# Patient Record
Sex: Female | Born: 1935
Health system: Southern US, Community
[De-identification: ages and names within clinical notes are randomized; demographics above are authoritative.]

## PROBLEM LIST (undated history)

## (undated) DIAGNOSIS — J309 Allergic rhinitis, unspecified: Secondary | ICD-10-CM

## (undated) DIAGNOSIS — R609 Edema, unspecified: Secondary | ICD-10-CM

## (undated) DIAGNOSIS — Z9981 Dependence on supplemental oxygen: Secondary | ICD-10-CM

## (undated) DIAGNOSIS — E559 Vitamin D deficiency, unspecified: Secondary | ICD-10-CM

## (undated) DIAGNOSIS — R0602 Shortness of breath: Secondary | ICD-10-CM

## (undated) DIAGNOSIS — Z973 Presence of spectacles and contact lenses: Secondary | ICD-10-CM

## (undated) DIAGNOSIS — I771 Stricture of artery: Secondary | ICD-10-CM

## (undated) DIAGNOSIS — N3000 Acute cystitis without hematuria: Secondary | ICD-10-CM

## (undated) DIAGNOSIS — M545 Low back pain, unspecified: Secondary | ICD-10-CM

## (undated) DIAGNOSIS — M1990 Unspecified osteoarthritis, unspecified site: Secondary | ICD-10-CM

## (undated) DIAGNOSIS — Z972 Presence of dental prosthetic device (complete) (partial): Secondary | ICD-10-CM

## (undated) DIAGNOSIS — R739 Hyperglycemia, unspecified: Secondary | ICD-10-CM

## (undated) DIAGNOSIS — R600 Localized edema: Secondary | ICD-10-CM

## (undated) DIAGNOSIS — M8580 Other specified disorders of bone density and structure, unspecified site: Secondary | ICD-10-CM

## (undated) DIAGNOSIS — K219 Gastro-esophageal reflux disease without esophagitis: Secondary | ICD-10-CM

## (undated) DIAGNOSIS — I10 Essential (primary) hypertension: Secondary | ICD-10-CM

## (undated) DIAGNOSIS — J449 Chronic obstructive pulmonary disease, unspecified: Secondary | ICD-10-CM

## (undated) DIAGNOSIS — R921 Mammographic calcification found on diagnostic imaging of breast: Secondary | ICD-10-CM

## (undated) DIAGNOSIS — E785 Hyperlipidemia, unspecified: Secondary | ICD-10-CM

## (undated) DIAGNOSIS — C50919 Malignant neoplasm of unspecified site of unspecified female breast: Secondary | ICD-10-CM

## (undated) HISTORY — DX: Edema, unspecified: R60.9

## (undated) HISTORY — DX: Hyperglycemia, unspecified: R73.9

## (undated) HISTORY — DX: Hyperlipidemia, unspecified: E78.5

## (undated) HISTORY — PX: BREAST LUMPECTOMY: SHX2

## (undated) HISTORY — PX: CATARACT EXTRACTION: SUR2

## (undated) HISTORY — DX: Vitamin D deficiency, unspecified: E55.9

## (undated) HISTORY — PX: BREAST BIOPSY: SHX20

## (undated) HISTORY — DX: Low back pain, unspecified: M54.50

## (undated) HISTORY — DX: Essential (primary) hypertension: I10

## (undated) HISTORY — DX: Stricture of artery: I77.1

## (undated) HISTORY — DX: Mammographic calcification found on diagnostic imaging of breast: R92.1

## (undated) HISTORY — DX: Low back pain: M54.5

## (undated) HISTORY — DX: Localized edema: R60.0

## (undated) HISTORY — DX: Chronic obstructive pulmonary disease, unspecified: J44.9

## (undated) HISTORY — DX: Other specified disorders of bone density and structure, unspecified site: M85.80

## (undated) HISTORY — DX: Gastro-esophageal reflux disease without esophagitis: K21.9

## (undated) HISTORY — DX: Allergic rhinitis, unspecified: J30.9

---

## 2000-01-04 ENCOUNTER — Ambulatory Visit (HOSPITAL_COMMUNITY): Admission: RE | Admit: 2000-01-04 | Discharge: 2000-01-04 | Payer: Self-pay | Admitting: Internal Medicine

## 2000-03-30 ENCOUNTER — Encounter: Payer: Self-pay | Admitting: Internal Medicine

## 2000-03-30 ENCOUNTER — Encounter: Admission: RE | Admit: 2000-03-30 | Discharge: 2000-03-30 | Payer: Self-pay | Admitting: Internal Medicine

## 2000-09-13 ENCOUNTER — Ambulatory Visit (HOSPITAL_COMMUNITY): Admission: RE | Admit: 2000-09-13 | Discharge: 2000-09-13 | Payer: Self-pay | Admitting: *Deleted

## 2001-04-05 ENCOUNTER — Other Ambulatory Visit: Admission: RE | Admit: 2001-04-05 | Discharge: 2001-04-05 | Payer: Self-pay | Admitting: Family Medicine

## 2001-09-10 ENCOUNTER — Encounter: Payer: Self-pay | Admitting: Internal Medicine

## 2001-09-10 ENCOUNTER — Encounter: Admission: RE | Admit: 2001-09-10 | Discharge: 2001-09-10 | Payer: Self-pay | Admitting: Internal Medicine

## 2001-09-12 ENCOUNTER — Encounter: Admission: RE | Admit: 2001-09-12 | Discharge: 2001-09-12 | Payer: Self-pay | Admitting: Internal Medicine

## 2001-09-12 ENCOUNTER — Encounter: Payer: Self-pay | Admitting: Internal Medicine

## 2002-06-18 ENCOUNTER — Ambulatory Visit (HOSPITAL_COMMUNITY): Admission: RE | Admit: 2002-06-18 | Discharge: 2002-06-18 | Payer: Self-pay | Admitting: Internal Medicine

## 2002-09-16 ENCOUNTER — Encounter: Payer: Self-pay | Admitting: Internal Medicine

## 2002-09-16 ENCOUNTER — Encounter: Admission: RE | Admit: 2002-09-16 | Discharge: 2002-09-16 | Payer: Self-pay | Admitting: Internal Medicine

## 2003-05-14 ENCOUNTER — Encounter: Admission: RE | Admit: 2003-05-14 | Discharge: 2003-05-14 | Payer: Self-pay | Admitting: Family Medicine

## 2003-10-30 ENCOUNTER — Encounter: Admission: RE | Admit: 2003-10-30 | Discharge: 2003-10-30 | Payer: Self-pay | Admitting: Internal Medicine

## 2004-03-09 ENCOUNTER — Other Ambulatory Visit: Admission: RE | Admit: 2004-03-09 | Discharge: 2004-03-09 | Payer: Self-pay | Admitting: Internal Medicine

## 2005-03-15 ENCOUNTER — Other Ambulatory Visit: Admission: RE | Admit: 2005-03-15 | Discharge: 2005-03-15 | Payer: Self-pay | Admitting: Internal Medicine

## 2005-03-24 ENCOUNTER — Encounter: Admission: RE | Admit: 2005-03-24 | Discharge: 2005-03-24 | Payer: Self-pay | Admitting: Internal Medicine

## 2006-04-26 ENCOUNTER — Encounter: Admission: RE | Admit: 2006-04-26 | Discharge: 2006-04-26 | Payer: Self-pay | Admitting: Family Medicine

## 2006-05-03 ENCOUNTER — Encounter: Admission: RE | Admit: 2006-05-03 | Discharge: 2006-05-03 | Payer: Self-pay | Admitting: Family Medicine

## 2006-11-03 ENCOUNTER — Encounter: Admission: RE | Admit: 2006-11-03 | Discharge: 2006-11-03 | Payer: Self-pay | Admitting: Family Medicine

## 2007-03-20 ENCOUNTER — Encounter: Payer: Self-pay | Admitting: Family Medicine

## 2007-03-20 ENCOUNTER — Ambulatory Visit (HOSPITAL_COMMUNITY): Admission: RE | Admit: 2007-03-20 | Discharge: 2007-03-20 | Payer: Self-pay | Admitting: Family Medicine

## 2007-03-20 ENCOUNTER — Other Ambulatory Visit: Admission: RE | Admit: 2007-03-20 | Discharge: 2007-03-20 | Payer: Self-pay | Admitting: Family Medicine

## 2007-05-22 ENCOUNTER — Encounter: Admission: RE | Admit: 2007-05-22 | Discharge: 2007-05-22 | Payer: Self-pay | Admitting: Family Medicine

## 2008-11-25 ENCOUNTER — Encounter: Admission: RE | Admit: 2008-11-25 | Discharge: 2008-11-25 | Payer: Self-pay | Admitting: Family Medicine

## 2009-12-30 ENCOUNTER — Encounter: Admission: RE | Admit: 2009-12-30 | Discharge: 2009-12-30 | Payer: Self-pay | Admitting: *Deleted

## 2010-03-08 ENCOUNTER — Encounter: Payer: Self-pay | Admitting: Family Medicine

## 2010-03-08 ENCOUNTER — Encounter: Payer: Self-pay | Admitting: Interventional Radiology

## 2011-01-20 ENCOUNTER — Other Ambulatory Visit: Payer: Self-pay | Admitting: Family Medicine

## 2011-01-20 DIAGNOSIS — Z1231 Encounter for screening mammogram for malignant neoplasm of breast: Secondary | ICD-10-CM

## 2011-02-18 ENCOUNTER — Ambulatory Visit: Payer: Self-pay

## 2011-04-01 ENCOUNTER — Other Ambulatory Visit: Payer: Self-pay | Admitting: Family Medicine

## 2011-04-01 DIAGNOSIS — Z1231 Encounter for screening mammogram for malignant neoplasm of breast: Secondary | ICD-10-CM

## 2011-04-25 ENCOUNTER — Ambulatory Visit
Admission: RE | Admit: 2011-04-25 | Discharge: 2011-04-25 | Disposition: A | Discharge status: Home / Self Care | Payer: Medicare Other | Source: Ambulatory Visit | Attending: Family Medicine | Admitting: Family Medicine

## 2011-04-25 DIAGNOSIS — Z1231 Encounter for screening mammogram for malignant neoplasm of breast: Secondary | ICD-10-CM

## 2012-02-15 HISTORY — PX: OTHER SURGICAL HISTORY: SHX169

## 2012-05-16 ENCOUNTER — Other Ambulatory Visit: Payer: Self-pay | Admitting: Family Medicine

## 2012-05-16 DIAGNOSIS — I10 Essential (primary) hypertension: Secondary | ICD-10-CM

## 2012-05-16 DIAGNOSIS — R0989 Other specified symptoms and signs involving the circulatory and respiratory systems: Secondary | ICD-10-CM

## 2012-05-16 DIAGNOSIS — E785 Hyperlipidemia, unspecified: Secondary | ICD-10-CM

## 2012-05-24 ENCOUNTER — Ambulatory Visit
Admission: RE | Admit: 2012-05-24 | Discharge: 2012-05-24 | Disposition: A | Discharge status: Home / Self Care | Payer: Medicare Other | Source: Ambulatory Visit | Attending: Family Medicine | Admitting: Family Medicine

## 2012-05-24 DIAGNOSIS — I10 Essential (primary) hypertension: Secondary | ICD-10-CM

## 2012-05-24 DIAGNOSIS — E785 Hyperlipidemia, unspecified: Secondary | ICD-10-CM

## 2012-05-24 DIAGNOSIS — R0989 Other specified symptoms and signs involving the circulatory and respiratory systems: Secondary | ICD-10-CM

## 2012-06-19 ENCOUNTER — Other Ambulatory Visit (HOSPITAL_COMMUNITY): Payer: Self-pay | Admitting: Cardiovascular Disease

## 2012-06-19 DIAGNOSIS — I739 Peripheral vascular disease, unspecified: Secondary | ICD-10-CM

## 2012-06-26 ENCOUNTER — Encounter (HOSPITAL_COMMUNITY): Payer: Medicare Other

## 2012-07-02 ENCOUNTER — Other Ambulatory Visit: Payer: Self-pay

## 2012-07-02 DIAGNOSIS — Z1231 Encounter for screening mammogram for malignant neoplasm of breast: Secondary | ICD-10-CM

## 2012-07-12 ENCOUNTER — Encounter (HOSPITAL_COMMUNITY): Payer: Medicare Other

## 2012-07-16 ENCOUNTER — Encounter (HOSPITAL_COMMUNITY): Payer: Medicare Other

## 2012-08-03 ENCOUNTER — Ambulatory Visit
Admission: RE | Admit: 2012-08-03 | Discharge: 2012-08-03 | Disposition: A | Discharge status: Home / Self Care | Payer: Medicare Other | Source: Ambulatory Visit

## 2012-08-03 DIAGNOSIS — Z1231 Encounter for screening mammogram for malignant neoplasm of breast: Secondary | ICD-10-CM

## 2012-08-24 ENCOUNTER — Telehealth: Payer: Self-pay | Admitting: Cardiovascular Disease

## 2012-08-30 ENCOUNTER — Encounter (HOSPITAL_COMMUNITY): Payer: Medicare Other

## 2013-09-11 ENCOUNTER — Other Ambulatory Visit: Payer: Self-pay | Admitting: Family Medicine

## 2013-09-11 DIAGNOSIS — M79606 Pain in leg, unspecified: Secondary | ICD-10-CM

## 2013-09-11 DIAGNOSIS — Z87891 Personal history of nicotine dependence: Secondary | ICD-10-CM

## 2013-09-12 ENCOUNTER — Other Ambulatory Visit: Payer: Self-pay | Admitting: Family Medicine

## 2013-09-12 ENCOUNTER — Ambulatory Visit
Admission: RE | Admit: 2013-09-12 | Discharge: 2013-09-12 | Disposition: A | Discharge status: Home / Self Care | Payer: Medicare Other | Source: Ambulatory Visit | Attending: Family Medicine | Admitting: Family Medicine

## 2013-09-12 DIAGNOSIS — R0602 Shortness of breath: Secondary | ICD-10-CM

## 2013-09-12 DIAGNOSIS — M79606 Pain in leg, unspecified: Secondary | ICD-10-CM

## 2013-09-12 DIAGNOSIS — J449 Chronic obstructive pulmonary disease, unspecified: Secondary | ICD-10-CM

## 2013-09-12 DIAGNOSIS — Z87891 Personal history of nicotine dependence: Secondary | ICD-10-CM

## 2013-09-17 ENCOUNTER — Other Ambulatory Visit: Payer: Self-pay

## 2013-09-17 DIAGNOSIS — Z1231 Encounter for screening mammogram for malignant neoplasm of breast: Secondary | ICD-10-CM

## 2013-09-25 ENCOUNTER — Ambulatory Visit: Payer: Medicare Other

## 2013-10-02 ENCOUNTER — Ambulatory Visit (INDEPENDENT_AMBULATORY_CARE_PROVIDER_SITE_OTHER): Payer: Medicare Other | Admitting: Pulmonary Disease

## 2013-10-02 ENCOUNTER — Encounter: Payer: Self-pay | Admitting: Pulmonary Disease

## 2013-10-02 VITALS — BP 142/60 | HR 103 | Ht 61.0 in | Wt 179.0 lb

## 2013-10-02 DIAGNOSIS — J438 Other emphysema: Secondary | ICD-10-CM

## 2013-10-02 DIAGNOSIS — R0902 Hypoxemia: Secondary | ICD-10-CM

## 2013-10-02 DIAGNOSIS — J9611 Chronic respiratory failure with hypoxia: Secondary | ICD-10-CM | POA: Insufficient documentation

## 2013-10-02 DIAGNOSIS — J9610 Chronic respiratory failure, unspecified whether with hypoxia or hypercapnia: Secondary | ICD-10-CM

## 2013-10-02 NOTE — Patient Instructions (Addendum)
Take spiriva 2 puffs daily no matter how you feel Take albuterol 2 puffs every four hours as needed for shortness of breath Use oxygen at 3L continuously We will arrange pulmonary rehab for you at [facility] We will see you back in 4-6 weeks or sooner if needed

## 2013-10-02 NOTE — Assessment & Plan Note (Signed)
COPD: GOLD GRADE D Combined recommendations from the [place], [employer]icians, Investment banker, corporate, [place] (Qaseem A et al, Ann Intern Med. 2011;155(3):179) recommends tobacco cessation, pulmonary rehab (for symptomatic patients with an FEV1 < 50% predicted), supplemental oxygen (for patients with SaO2 <88% or paO2 <55), and appropriate bronchodilator therapy.  In regards to long acting bronchodilators, they recommend monotherapy (FEV1 60-80% with symptoms weak evidence, FEV1 with symptoms <60% strong evidence), or combination therapy (FEV1 <60% with symptoms, strong recommendation, moderate evidence).  One should also provide patients with annual immunizations and consider therapy for prevention of COPD exacerbations (ie. roflumilast or azithromycin) when appopriate.  -O2 therapy: Recommended 2 L continuous  -Immunizations: Advised that she check with her PCP about pneumovax status -Tobacco use: Quit in 1990's -Exercise: Pulmonary rehab referral -Bronchodilator therapy: Start Spiriva -Exacerbation prevention: Spiriva

## 2013-10-02 NOTE — Progress Notes (Signed)
Subjective:    Patient ID: Courtney Cline, female    DOB: [birth year], 80 y.o.   MRN: [id number]  HPI  Courtney Cline previously saw an allergist for emphysema and asthma.  She is here to see me today because she has been having more trouble breathing.  She has been having more trouble in the last 6 weeks after a recent cold. This made her breathing worse and she needd to be seen by her PCP who treated her with prednisone twice.  She was also treated with an antibiotic iniitially as well, but she still was wheezing.  After the second round of prednisone she feels a little better, but not fully recovered.  This is the first time she has takend prednisone.  She used to get bronchitis in the past.   In the last few weeks she has been feeling chest tightness and dyspnea with exertion.  Just walking out to get the newspaper will make her dyspneic.  This seems to come and go. She is not coughing much, maybe a little more lately than normal.  She has noted more runny nose lately.  She thinks that she may have allergies.  She sneezes a lot.    She previously smoked 1 pack of cigarettes daily and quit in the 1990's after smoking for about 30 years.    As a child she had no respiratory problems.  Past Medical History  Diagnosis Date  . Hyperlipidemia   . Hypertension   . GERD (gastroesophageal reflux disease)   . COPD (chronic obstructive pulmonary disease)   . Osteopenia   . Breast calcification seen on mammogram     Right breast  . Hyperglycemia   . Peripheral edema   . Vitamin D deficiency   . Allergic rhinitis   . Low back pain      Family History  Problem Relation Age of Onset  . Emphysema Mother   . Emphysema Maternal Grandfather      History   Social History  . Marital Status: Married    Spouse Name: [name]    Number of Children: [name]  . Years of Education: [name]   Occupational History  . retired     Chartered certified accountant   Social History Main Topics  . Smoking status: Former Smoker  -- 1.00 packs/day for 40 years    Types: Cigarettes    Quit date: 02/14/1994  . Smokeless tobacco: Never Used  . Alcohol Use: No  . Drug Use: No  . Sexual Activity: Not on file   Other Topics Concern  . Not on file   Social History Narrative  . No narrative on file     Not on File   Outpatient Prescriptions Prior to Visit  Medication Sig Dispense Refill  . acetaminophen (TYLENOL) 325 MG tablet Take 650 mg by mouth every 6 (six) hours as needed.      Marland Kitchen albuterol (PROVENTIL HFA;VENTOLIN HFA) 108 (90 BASE) MCG/ACT inhaler Inhale 2 puffs into the lungs every 6 (six) hours as needed for wheezing or shortness of breath.      . Cyanocobalamin (VITAMIN B 12 PO) Take 1 tablet by mouth daily.      . hydrochlorothiazide (MICROZIDE) 12.5 MG capsule Take 12.5 mg by mouth daily.      Marland Kitchen lisinopril (PRINIVIL,ZESTRIL) 40 MG tablet Take 40 mg by mouth daily.      Marland Kitchen lovastatin (MEVACOR) 40 MG tablet Take 40 mg by mouth at bedtime.      Marland Kitchen  omeprazole (PRILOSEC) 20 MG capsule Take 20 mg by mouth daily.      . Fluticasone Propionate, Inhal, (FLOVENT DISKUS) 100 MCG/BLIST AEPB Inhale 1 puff into the lungs daily.      . Vitamin D, Ergocalciferol, (DRISDOL) 50000 UNITS CAPS capsule Take 50,000 Units by mouth every 7 (seven) days.       No facility-administered medications prior to visit.      Review of Systems  Constitutional: Negative for fever and unexpected weight change.  HENT: Positive for congestion, rhinorrhea and sinus pressure. Negative for dental problem, ear pain, nosebleeds, postnasal drip, sneezing, sore throat and trouble swallowing.   Eyes: Negative for redness and itching.  Respiratory: Positive for cough, chest tightness and shortness of breath. Negative for wheezing.   Cardiovascular: Positive for leg swelling. Negative for palpitations.  Gastrointestinal: Negative for nausea and vomiting.  Genitourinary: Negative for dysuria.  Musculoskeletal: Negative for joint swelling.  Skin:  Negative for rash.  Neurological: Negative for headaches.  Hematological: Does not bruise/bleed easily.  Psychiatric/Behavioral: Negative for dysphoric mood. The patient is not nervous/anxious.        Objective:   Physical Exam  Filed Vitals:   10/02/13 1437  BP: 142/60  Pulse: 103  Height: 5\' 1"  (1.549 m)  Weight: 179 lb (81.194 kg)  SpO2: 88%  RA  10/02/2013 > dropped to 85% on RA, improved to mid 90's on 3L [place]  Gen: well appearing, no acute distress HEENT: NCAT, PERRL, EOMi, OP clear, neck supple without masses PULM: diminished bilaterally, no wheezing CV: RRR, no mgr, no JVD AB: BS+, soft, nontender, no hsm Ext: chronic stasis changes, bluish discoloration, no edema, no clubbing Derm: see above, otherwise no rash or skin breakdown Neuro: A&Ox4, CN II-XII intact, strength 5/5 in all 4 extremities  08/2013 CXR > emphysema, prominent pulmonary vasculature, normal ? LAE      Assessment & Plan:   COPD (chronic obstructive pulmonary disease) COPD: GOLD GRADE D Combined recommendations from the [place], [employer]icians, Investment banker, corporate, [employer] Society (Qaseem A et al, Ann Intern Med. 2011;155(3):179) recommends tobacco cessation, pulmonary rehab (for symptomatic patients with an FEV1 < 50% predicted), supplemental oxygen (for patients with SaO2 <88% or paO2 <55), and appropriate bronchodilator therapy.  In regards to long acting bronchodilators, they recommend monotherapy (FEV1 60-80% with symptoms weak evidence, FEV1 with symptoms <60% strong evidence), or combination therapy (FEV1 <60% with symptoms, strong recommendation, moderate evidence).  One should also provide patients with annual immunizations and consider therapy for prevention of COPD exacerbations (ie. roflumilast or azithromycin) when appopriate.  -O2 therapy: Recommended 2 L continuous  -Immunizations: Advised that she check with her PCP about  pneumovax status -Tobacco use: Quit in 1990's -Exercise: Pulmonary rehab referral -Bronchodilator therapy: Start Spiriva -Exacerbation prevention: Spiriva   Chronic hypoxemic respiratory failure Today on ambulatory oximetry she started at 88% at rest, then dropped below 88% on RA, improved to mid 90's on 2L [place]  Plan: -2L O2 continuously    Updated Medication List Outpatient Encounter Prescriptions as of 10/02/2013  Medication Sig  . acetaminophen (TYLENOL) 325 MG tablet Take 650 mg by mouth every 6 (six) hours as needed.  Marland Kitchen albuterol (PROVENTIL HFA;VENTOLIN HFA) 108 (90 BASE) MCG/ACT inhaler Inhale 2 puffs into the lungs every 6 (six) hours as needed for wheezing or shortness of breath.  . Cholecalciferol (VITAMIN D) 2000 UNITS CAPS Take 1 capsule by mouth daily.  . Cyanocobalamin (VITAMIN B 12  PO) Take 1 tablet by mouth daily.  . hydrochlorothiazide (MICROZIDE) 12.5 MG capsule Take 12.5 mg by mouth daily.  Marland Kitchen lisinopril (PRINIVIL,ZESTRIL) 40 MG tablet Take 40 mg by mouth daily.  Marland Kitchen lovastatin (MEVACOR) 40 MG tablet Take 40 mg by mouth at bedtime.  Marland Kitchen omeprazole (PRILOSEC) 20 MG capsule Take 20 mg by mouth daily.  . [DISCONTINUED] Fluticasone Propionate, Inhal, (FLOVENT DISKUS) 100 MCG/BLIST AEPB Inhale 1 puff into the lungs daily.  . [DISCONTINUED] Vitamin D, Ergocalciferol, (DRISDOL) 50000 UNITS CAPS capsule Take 50,000 Units by mouth every 7 (seven) days.

## 2013-10-02 NOTE — Assessment & Plan Note (Addendum)
Today on ambulatory oximetry she started at 88% at rest, then dropped below 88% on RA, improved to mid 90's on 2L [place]  Plan: -2L O2 continuously

## 2013-10-11 ENCOUNTER — Ambulatory Visit
Admission: RE | Admit: 2013-10-11 | Discharge: 2013-10-11 | Disposition: A | Discharge status: Home / Self Care | Payer: Medicare Other | Source: Ambulatory Visit

## 2013-10-11 ENCOUNTER — Telehealth (HOSPITAL_COMMUNITY): Payer: Self-pay

## 2013-10-11 DIAGNOSIS — Z1231 Encounter for screening mammogram for malignant neoplasm of breast: Secondary | ICD-10-CM

## 2013-10-14 ENCOUNTER — Other Ambulatory Visit: Payer: Self-pay | Admitting: Family Medicine

## 2013-10-14 ENCOUNTER — Telehealth (HOSPITAL_COMMUNITY): Payer: Self-pay

## 2013-10-14 DIAGNOSIS — R928 Other abnormal and inconclusive findings on diagnostic imaging of breast: Secondary | ICD-10-CM

## 2013-10-22 ENCOUNTER — Telehealth: Payer: Self-pay | Admitting: Pulmonary Disease

## 2013-10-22 MED ORDER — TIOTROPIUM BROMIDE MONOHYDRATE 2.5 MCG/ACT IN AERS: 2.0000 | INHALATION_SPRAY | Freq: Every day | RESPIRATORY_TRACT | Status: DC

## 2013-10-22 NOTE — Telephone Encounter (Signed)
atc fast busy signal x 4 wcb

## 2013-10-22 NOTE — Telephone Encounter (Signed)
Called spoke with pt. She needs her spiriva resp called in. She c/o nasal congestion. I advised pt she can take OTC decongestant and get saline nasal spray. She will try this. Nothing further needed

## 2013-10-23 ENCOUNTER — Ambulatory Visit
Admission: RE | Admit: 2013-10-23 | Discharge: 2013-10-23 | Disposition: A | Discharge status: Home / Self Care | Payer: Medicare Other | Source: Ambulatory Visit | Attending: Family Medicine | Admitting: Family Medicine

## 2013-10-23 ENCOUNTER — Other Ambulatory Visit: Payer: Self-pay | Admitting: Family Medicine

## 2013-10-23 DIAGNOSIS — R928 Other abnormal and inconclusive findings on diagnostic imaging of breast: Secondary | ICD-10-CM

## 2013-10-23 DIAGNOSIS — N6310 Unspecified lump in the right breast, unspecified quadrant: Secondary | ICD-10-CM

## 2013-10-24 ENCOUNTER — Other Ambulatory Visit: Payer: Self-pay | Admitting: Family Medicine

## 2013-10-24 DIAGNOSIS — C50911 Malignant neoplasm of unspecified site of right female breast: Secondary | ICD-10-CM

## 2013-10-29 ENCOUNTER — Other Ambulatory Visit: Payer: Medicare Other

## 2013-11-05 ENCOUNTER — Telehealth: Payer: Self-pay | Admitting: *Deleted

## 2013-11-05 NOTE — Telephone Encounter (Signed)
Received referral from [place].  Called and confirmed 11/18/13 high risk appt w/ pt.  Emailed Engineer, civil (consulting) at [employer] to make her aware.  Mailed welcoming letter, calendar and intake form to pt.  Took paperwork to HIM to create chart.

## 2013-11-11 ENCOUNTER — Encounter: Payer: Self-pay | Admitting: Pulmonary Disease

## 2013-11-11 ENCOUNTER — Ambulatory Visit (INDEPENDENT_AMBULATORY_CARE_PROVIDER_SITE_OTHER): Payer: Medicare Other | Admitting: Pulmonary Disease

## 2013-11-11 VITALS — BP 144/70 | HR 90 | Ht 61.0 in | Wt 182.1 lb

## 2013-11-11 DIAGNOSIS — Z23 Encounter for immunization: Secondary | ICD-10-CM

## 2013-11-11 DIAGNOSIS — R0902 Hypoxemia: Secondary | ICD-10-CM

## 2013-11-11 DIAGNOSIS — J438 Other emphysema: Secondary | ICD-10-CM

## 2013-11-11 DIAGNOSIS — J9611 Chronic respiratory failure with hypoxia: Secondary | ICD-10-CM

## 2013-11-11 DIAGNOSIS — J9610 Chronic respiratory failure, unspecified whether with hypoxia or hypercapnia: Secondary | ICD-10-CM

## 2013-11-11 NOTE — Progress Notes (Signed)
   Subjective:    Patient ID: Courtney Cline, female    DOB: 1933-09-13, 80 y.o.   MRN: [id number]  Synopsis: GOLD Grade D COPD 2015 FEV1 33% pred, on 2L O2 continuously  HPI  11/11/2013 ROV > [name] has been doing [place]. She has been feeling OK.  She would like a smaller or lighter oxygen tank.  However she has been found to have breast cancer.  She has seen a Psychologist, sport and exercise and the plan is to treat with medical management at this point.  Her breathing has been OK.  She has been coughing some but it is better.  She continues to take the Spiriva daly but she can't feel a difference with it.  She has yet to get a Flu shot this year.  She has yet to see an oncologist.    Past Medical History  Diagnosis Date  . Hyperlipidemia   . Hypertension   . GERD (gastroesophageal reflux disease)   . COPD (chronic obstructive pulmonary disease)   . Osteopenia   . Breast calcification seen on mammogram     Right breast  . Hyperglycemia   . Peripheral edema   . Vitamin D deficiency   . Allergic rhinitis   . Low back pain      Review of Systems     Objective:   Physical Exam Filed Vitals:   11/11/13 1047  BP: 144/70  Pulse: 90  Height: 5\' 1"  (1.549 m)  Weight: 182 lb 1.6 oz (82.6 kg)  SpO2: 95%   2L O2  Gen:, no acute distress HEENT: NCAT, EOMi, OP clear, PULM: Minimal air movement, no wheezing CV: RRR, no mgr, no JVD AB: BS+, soft, nontender, no hsm Ext: warm, trace edema, no clubbing, no cyanosis Derm: no rash or skin breakdown Neuro: A&Ox4, MAEW        Assessment & Plan:   COPD (chronic obstructive pulmonary disease) This has been a stable interval for [name].  Plan: -continue Spiriva daily -flu shot UTD -I asked her to discuss whether or not she has had the Prevnar vaccine with her PCP -f/u 70months  Chronic hypoxemic respiratory failure Continue 2 L on exertion and qHS    Updated Medication List Outpatient Encounter Prescriptions as of 11/11/2013    Medication Sig  . acetaminophen (TYLENOL) 325 MG tablet Take 650 mg by mouth every 6 (six) hours as needed.  Marland Kitchen albuterol (PROVENTIL HFA;VENTOLIN HFA) 108 (90 BASE) MCG/ACT inhaler Inhale 2 puffs into the lungs every 6 (six) hours as needed for wheezing or shortness of breath.  . Cholecalciferol (VITAMIN D) 2000 UNITS CAPS Take 1 capsule by mouth daily.  . Cyanocobalamin (VITAMIN B 12 PO) Take 1 tablet by mouth daily.  . hydrochlorothiazide (MICROZIDE) 12.5 MG capsule Take 12.5 mg by mouth daily.  Marland Kitchen lisinopril (PRINIVIL,ZESTRIL) 40 MG tablet Take 40 mg by mouth daily.  Marland Kitchen lovastatin (MEVACOR) 40 MG tablet Take 40 mg by mouth at bedtime.  Marland Kitchen omeprazole (PRILOSEC) 20 MG capsule Take 20 mg by mouth daily.  . Tiotropium Bromide Monohydrate (SPIRIVA RESPIMAT) 2.5 MCG/ACT AERS Inhale 2 puffs into the lungs daily.

## 2013-11-11 NOTE — Assessment & Plan Note (Signed)
This has been a stable interval for [name].  Plan: -continue Spiriva daily -flu shot UTD -I asked her to discuss whether or not she has had the Prevnar vaccine with her PCP -f/u 29months

## 2013-11-11 NOTE — Patient Instructions (Signed)
Keep taking the Spiriva every day We will prescribe a portable oxygen concentrator for you  Ask your primary care physician about having the Prevnar vaccine  We will see you back in 6 months or sooner if needed

## 2013-11-11 NOTE — Assessment & Plan Note (Signed)
Continue 2 L on exertion and qHS

## 2013-11-12 ENCOUNTER — Telehealth: Payer: Self-pay | Admitting: Hematology

## 2013-11-12 NOTE — Telephone Encounter (Signed)
C/D 11/12/13 for appt. 11/18/13

## 2013-11-18 ENCOUNTER — Ambulatory Visit (HOSPITAL_BASED_OUTPATIENT_CLINIC_OR_DEPARTMENT_OTHER): Payer: Medicare Other

## 2013-11-18 ENCOUNTER — Ambulatory Visit (HOSPITAL_BASED_OUTPATIENT_CLINIC_OR_DEPARTMENT_OTHER): Payer: Medicare Other | Admitting: Hematology

## 2013-11-18 ENCOUNTER — Encounter: Payer: Self-pay | Admitting: Hematology

## 2013-11-18 VITALS — BP 150/49 | HR 103 | Temp 98.0°F | Resp 19 | Ht 61.0 in | Wt 182.5 lb

## 2013-11-18 DIAGNOSIS — Z79811 Long term (current) use of aromatase inhibitors: Secondary | ICD-10-CM

## 2013-11-18 DIAGNOSIS — C50211 Malignant neoplasm of upper-inner quadrant of right female breast: Secondary | ICD-10-CM

## 2013-11-18 DIAGNOSIS — J449 Chronic obstructive pulmonary disease, unspecified: Secondary | ICD-10-CM

## 2013-11-18 MED ORDER — ANASTROZOLE 1 MG PO TABS: 1.0000 mg | ORAL_TABLET | Freq: Every day | ORAL | Status: DC

## 2013-11-18 NOTE — Progress Notes (Signed)
Checked in new patient with no financial issues prior to seeing the dr. She has appt card and has not been out of the country,

## 2013-11-19 DIAGNOSIS — C50211 Malignant neoplasm of upper-inner quadrant of right female breast: Secondary | ICD-10-CM | POA: Insufficient documentation

## 2013-11-21 ENCOUNTER — Telehealth: Payer: Self-pay | Admitting: Hematology

## 2013-11-21 NOTE — Telephone Encounter (Signed)
s/w pt re appt for 11/12

## 2013-11-24 NOTE — Progress Notes (Signed)
[facility] ONCOLOGY CONSULT NOTE Date of Visit: 11/18/2013   Patient Care Team: Antony Blackbird, MD as PCP - General (Family Medicine) Simonne Maffucci - Pulmonary Erroll Luna - Surgery  CHIEF COMPLAINTS/PURPOSE OF CONSULTATION:   Left Breast cancer  HISTORY OF PRESENTING ILLNESS:   Courtney Cline 80 y.o. female from [place] referred here for recent diagnosis of left breast cancer.  I tabulated her oncologic history below.  SUMMARY OF ONCOLOGIC HISTORY:   Breast cancer of upper-inner quadrant of right female breast   12/06/2012 Imaging Bone Density performed at [facility] physicians T score Lumbar spine -0.1 (-0.7 in 2012), Right neck femur -1.4 (-1.2 before), Left neck femur -1.4 (-1.5 before) and Interpretation is OSTEOPENIA by WHO criteria.    10/11/2013 Mammogram Abnormal Screening mammogram showing Right Breast mass.   10/23/2013 Breast US Diagnostic mammogram and US showed an irregular hypoechoic mass at 9'0 clock position right breast 8 cm from nipple. [place] measurement 1.2x0.7x1.1 cm, no axillary adenopathy.   10/23/2013 Initial Biopsy US guided biopsy  done with clip placement.   10/23/2013 Pathology Results Invasive ductal carcinoma (IDC), DCIS, invasive cancer grade 2, ER 100%+, PR 100%+, Ki-67% 7%, HER-2/NEU by CISH No amplification, ratio of her2:cep17 1.00, average her2 copy number per cell 1.95 (HER 2 Negative tumor). Molecular Classification LUMINAL A.    Clinical Stage T1C,N0,MX  (Stage 1-A)   11/01/2013 Surgery Initial surgical evaluation by Dr Marcello Moores Cornett: "Not good surgical candidate because of pulmonary status". Recommended anti-estrogen therapy.   11/18/2013 -  Neo-Adjuvant Anti-estrogen oral therapy Patient started on Arimidex today after initial evaluation (Dr Lona Kettle). Also discussed with Dr Lake Bells and Dr Brantley Stage to reconsider surgery after optimization of her pulmonary status, perhaps with Local anesthesia.   I spoke today with Dr Brantley Stage and her surgeon  and also Dr Jana Hakim. Ideally her chances of cure are maximum if we can resect the tumor (wide excision, lumpectomy, mastectomy) but because of her COPD and oxygen dependence, Dr Brantley Stage wanted to seek alternative options. Dr Jana Hakim said that and I discussed with Dr Brantley Stage if we can consider a surgery under local anesthesia the lumpectomy, not a SLN biopsy. I did start patient on Arimidex which can potentially control the cancer but does not give a curative potential to patient. Dr Lake Bells said that he would be willing to see patient to further optimize her pulmonary status but categorize her as a "Moderate" risk for anesthesia complication because of the nature of surgery. She smoked for 40+ years and quit smoking 21 years ago. Her FEV1 was 480 cc as per DR McQuaid.   In terms of breast cancer risk profile:  She menarched at early age of 22 and went to menopause at age 25.  She had 2 pregnancy, her first child was born at age 63.  She did not  received birth control pills.   She was never exposed to fertility medications or hormone replacement therapy.  She has no family history of Breast/GYN/GI cancer. Husband had Kidney cancer.she never had colonoscopy.  MEDICAL HISTORY:  Past Medical History  Diagnosis Date  . Hyperlipidemia   . Hypertension   . GERD (gastroesophageal reflux disease)   . COPD (chronic obstructive pulmonary disease)   . Osteopenia   . Breast calcification seen on mammogram     Right breast  . Hyperglycemia   . Peripheral edema   . Vitamin D deficiency   . Allergic rhinitis   . Low back pain     SURGICAL  HISTORY: No past surgical history on file.  SOCIAL HISTORY: History   Social History  . Marital Status: Married    Spouse Name: [name]    Number of Children: [name]  . Years of Education: [name]   Occupational History  . retired     Chartered certified accountant   Social History Main Topics  . Smoking status: Former Smoker -- 1.00 packs/day for 40 years    Types: Cigarettes     Quit date: 02/14/1994  . Smokeless tobacco: Never Used  . Alcohol Use: No  . Drug Use: No  . Sexual Activity: Not on file   Other Topics Concern  . Not on file   Social History Narrative  . No narrative on file    FAMILY HISTORY: Family History  Problem Relation Age of Onset  . Emphysema Mother   . Emphysema Maternal Grandfather     ALLERGIES:  has No Known Allergies.  MEDICATIONS:  Current Outpatient Prescriptions  Medication Sig Dispense Refill  . acetaminophen (TYLENOL) 325 MG tablet Take 650 mg by mouth every 6 (six) hours as needed.      Marland Kitchen albuterol (PROVENTIL HFA;VENTOLIN HFA) 108 (90 BASE) MCG/ACT inhaler Inhale 2 puffs into the lungs every 6 (six) hours as needed for wheezing or shortness of breath.      . Cholecalciferol (VITAMIN D) 2000 UNITS CAPS Take 1 capsule by mouth daily.      . Cyanocobalamin (VITAMIN B 12 PO) Take 1 tablet by mouth daily.      . hydrochlorothiazide (MICROZIDE) 12.5 MG capsule Take 12.5 mg by mouth daily.      Marland Kitchen lisinopril (PRINIVIL,ZESTRIL) 40 MG tablet Take 40 mg by mouth daily.      Marland Kitchen lovastatin (MEVACOR) 40 MG tablet Take 40 mg by mouth at bedtime.      Marland Kitchen omeprazole (PRILOSEC) 20 MG capsule Take 20 mg by mouth daily.      . Tiotropium Bromide Monohydrate (SPIRIVA RESPIMAT) 2.5 MCG/ACT AERS Inhale 2 puffs into the lungs daily.  4 g  4  . anastrozole (ARIMIDEX) 1 MG tablet Take 1 tablet (1 mg total) by mouth daily.  90 tablet  3   No current facility-administered medications for this visit.    Review of Systems  Constitutional: Negative for fever, chills, weight loss, malaise/fatigue and diaphoresis.  HENT: Negative for congestion, ear discharge, ear pain, hearing loss, nosebleeds, sore throat and tinnitus.   Eyes: Negative for blurred vision, double vision, photophobia, pain, discharge and redness.  Respiratory: Positive for shortness of breath. Negative for cough, hemoptysis, sputum production, wheezing and stridor.   Cardiovascular:  Negative for chest pain, palpitations, orthopnea, claudication, leg swelling and PND.  Gastrointestinal: Negative for heartburn, nausea, vomiting, abdominal pain, diarrhea, constipation, blood in stool and melena.  Genitourinary: Negative for dysuria, urgency, frequency, hematuria and flank pain.  Musculoskeletal: Negative for back pain, falls, joint pain, myalgias and neck pain.  Skin: Negative for itching and rash.  Neurological: Negative for dizziness, tingling, tremors, sensory change, speech change, focal weakness, seizures, loss of consciousness, weakness and headaches.  Endo/Heme/Allergies: Negative for environmental allergies and polydipsia. Does not bruise/bleed easily.  Psychiatric/Behavioral: Negative for depression, suicidal ideas, memory loss and substance abuse. The patient is not nervous/anxious and does not have insomnia.      PHYSICAL EXAMINATION: ECOG PERFORMANCE STATUS: 1  Filed Vitals:   11/18/13 1408  BP: 150/49  Pulse: 103  Temp: 98 F (36.7 C)  Resp: 19   Filed Weights   11/18/13  1408  Weight: 182 lb 8 oz (82.781 kg)    Physical Exam  Constitutional: She is oriented to person, place, and time. She appears well-developed and well-nourished.  HENT:  Head: Normocephalic and atraumatic.  Eyes: Conjunctivae and EOM are normal. Pupils are equal, round, and reactive to light.  Neck: Normal range of motion. Neck supple. No JVD present. No thyromegaly present.  Cardiovascular: Normal rate, regular rhythm, normal heart sounds and intact distal pulses.  Exam reveals no gallop and no friction rub.   No murmur heard. Pulmonary/Chest: Effort normal and breath sounds normal. No respiratory distress. She has no wheezes. She has no rales. She exhibits no tenderness. Right breast exhibits mass. Right breast exhibits no inverted nipple, no nipple discharge, no skin change and no tenderness. Left breast exhibits no inverted nipple, no mass, no nipple discharge, no skin change  and no tenderness.  Abdominal: Soft. She exhibits no distension and no mass. There is no tenderness. No hernia.  Musculoskeletal: Normal range of motion. She exhibits no edema and no tenderness.  Lymphadenopathy:    She has no cervical adenopathy.  Neurological: She is alert and oriented to person, place, and time. She has normal reflexes.  Skin: Skin is warm and dry. No rash noted. No erythema. No pallor.  Psychiatric: She has a normal mood and affect. Her behavior is normal. Judgment and thought content normal.    LABORATORY DATA:  I have reviewed the data as listed  RADIOGRAPHIC STUDIES: I have personally reviewed the radiological images as listed and agreed with the findings in the report. Mammogram and [place].  ASSESSMENT/PLAN:   84. 80 years old female with a Right Breast cancer. Clinical stage stage 1 (T1C,N0,M0). The tumor is Invasive ductal cancer with DCIS, Grade 2, ER+, PR+, HER2 negative, LUMINAL A molecular type.  2. Patient with Moderate Risk of anesthesia Complications as she has COPD and on home oxygen.  3. I discussed the case with the surgeon, her pulmonologist and Dr Jana Hakim. We can consider the surgery if her pulmonary status can be further optimized and if it can be done under local anesthesia.  4. In the mean time I have placed her on Arimidex so as to start the endocrine therapy and prevent any further metastasis. If surgery not done we can consider another [place] or MRI breast in 3 months to see response to neoadjuvant therapy.  5. I will see her again in 1 month. Her prognosis is good but if she can tolerate a surgery or wide excision, it will give her the best chance for longevity and cure.    All questions were answered. The patient knows to call the clinic with any problems, questions or concerns. I spent 30 minutes counseling the patient face to face. The total time spent in the appointment was 1 hour.     Bernadene Bell, MD Medical Hematologist/Oncologist [place] Pager: [phone] Office No: [phone]

## 2013-11-25 ENCOUNTER — Encounter: Payer: Self-pay | Admitting: Pulmonary Disease

## 2013-12-06 ENCOUNTER — Ambulatory Visit (INDEPENDENT_AMBULATORY_CARE_PROVIDER_SITE_OTHER): Payer: Self-pay | Admitting: Surgery

## 2013-12-06 DIAGNOSIS — C50911 Malignant neoplasm of unspecified site of right female breast: Secondary | ICD-10-CM

## 2013-12-06 NOTE — H&P (Signed)
Courtney Cline 12/06/2013 12:05 PM Location: [facility] Patient #: [id number] DOB: [birth year] Married / Language: English / Race: White Female History of Present Illness Courtney Cline Fus A. [name] MD; 12/06/2013 12:33 PM) Patient words: Eval for lumpectomy on Rt breast   CLINICAL DATA: Possible mass right breast identified on recent screening mammogram. EXAM: DIGITAL DIAGNOSTIC RIGHT MAMMOGRAM ULTRASOUND RIGHT BREAST COMPARISON: 10/11/2013 ACR Breast Density Category b: There are scattered areas of fibroglandular density. FINDINGS: Additional views performed today confirm an irregular mass with spiculated margins in the posterior third of the outer right breast. On physical exam, I do not palpate a discrete mass in the outer right breast. Ultrasound is performed, showing an irregular hypoechoic mass at 9 o'clock position right breast 8 cm from the nipple. This measures 1.2 x 0.7 x 1.1 cm and contains some internal vascular flow. Ultrasound of the right axilla shows no evidence of lymphadenopathy. IMPRESSION: 1.2 cm suspicious mass at 9 o'clock position right breast. RECOMMENDATION: Ultrasound-guided core needle biopsy of the right breast mass is recommended. The patient is scheduled to have the biopsy performed later today. I have discussed the findings and recommendations with the patient. Results were also provided in writing at the conclusion of the visit. If applicable, a reminder letter will be sent to the patient regarding the next appointment. BI-RADS CATEGORY 5: Highly suggestive of malignancy. Electronically Signed By: Courtney Cline M.D. On: 10/23/2013 12:48.  The patient is a 80 year old female who presents with breast cancer. The patient is being seen for a consultation for Stage I invasive ductal carcinoma of the right breast. Tumor markers include estrogen receptor positive, progesterone receptor positive and HER-2/neu negative. No associated conditions  are noted. The patient was referred by a specialty consultant. Initial presentation was for an abnormal mammogram. Current diagnosis was determined by mammography, breast ultrasound and core needle biopsy. This problem has not been previously evaluated. This problem has not been previously treated.    PT SEEN BY ONCOLOGY AND PULMONOLOGIST AND FELT TO BE SUITABLE FOR LUMPECTOMY. ONCOLOGY FELT THAT THIS WOULD BE BEST OPTION FOR LOCAL CONTROL.  The patient is a 80 year old female   Allergies Courtney Church, LPN; 04/54/0981 12:06 PM) No Known Drug Allergies 11/01/2013  Medication History Courtney Church, LPN; 19/14/7829 12:09 PM) Albuterol Sulfate (108 (90 Base)MCG/ACT Aero Pow Br Act, Inhalation) Active. Vitamin D (2000UNIT Capsule, Oral) Active. Vitamin B12 TR ( Tablet ER, Oral) Active. Microzide (12.5MG  Capsule, Oral) Active. Lisinopril (40MG  Tablet, Oral) Active. Mevacor (40MG  Tablet, Oral) Active. PriLOSEC OTC (20MG  Tablet DR, Oral) Active. Tiotropium Bromide Monohydrate (2.5MCG/ACT Aerosol Soln, Inhalation) Active. Arimidex (1MG  Tablet, Oral) Active. Amoxicillin ER (775MG  Tablet ER 24HR, Oral) Active.    Vitals Courtney Church LPN; 56/21/3086 12:10 PM) 12/06/2013 12:09 PM Weight: 183.25 lb Height: 61.5in Body Surface Area: 1.9 m Body Mass Index: 34.06 kg/m Temp.: 97.69F(Tympanic)  Pulse: 66 (Regular)  Resp.: 24 (Wheezing)  BP: 140/60 (Sitting, Left Arm, Standard)     Physical Exam (Courtney Cline A. Courtney Hargrove MD; 12/06/2013 12:34 PM)  General Mental Status-Alert. General Appearance-Consistent with stated age. Hydration-Well hydrated. Voice-Normal.  Head and Neck Head-normocephalic, atraumatic with no lesions or palpable masses.  Eye Eyeball - Bilateral-Extraocular movements intact. Sclera/Conjunctiva - Bilateral-No scleral icterus.  Chest and Lung Exam Note: ON HOME O2 NOT sob bs CLEAR   Breast Note: NOT  REPEATED   Cardiovascular Cardiovascular examination reveals -on palpation PMI is normal in location and amplitude, no palpable S3 or S4. Normal cardiac borders., normal heart  sounds, regular rate and rhythm with no murmurs, carotid auscultation reveals no bruits and normal pedal pulses bilaterally.  Musculoskeletal Normal Exam - Left-Upper Extremity Strength Normal and Lower Extremity Strength Normal. Normal Exam - Right-Upper Extremity Strength Normal, Lower Extremity Weakness.    Assessment & Plan (Courtney Cline A. Courtney Codd MD; 12/06/2013 12:30 PM)  BREAST CANCER, RIGHT (174.9  C50.911) Impression: SEEN BY ONCOLOGY AND PULMONARY. FELT TO BE STABLE ENOUGH FOR LUMPECTOMY. WILL NEED REGIONAL BLOCK AND LOCAL. TRY TO DO UNDER MAC. STILL HIGH RISK BUT OK WITH THAT. Risk of lumpectomy include bleeding, infection, seroma, more surgery, use of seed/wire, wound care, cosmetic deformity and the need for other treatments, death , blood clots, death. Pt agrees to proceed.  Current Plans Pt Education - CSS Breast Biopsy Instructions (FLB): discussed with patient and provided information.

## 2013-12-09 ENCOUNTER — Other Ambulatory Visit (INDEPENDENT_AMBULATORY_CARE_PROVIDER_SITE_OTHER): Payer: Self-pay | Admitting: Surgery

## 2013-12-09 DIAGNOSIS — C50911 Malignant neoplasm of unspecified site of right female breast: Secondary | ICD-10-CM

## 2013-12-10 ENCOUNTER — Telehealth: Payer: Self-pay | Admitting: Pulmonary Disease

## 2013-12-10 NOTE — Telephone Encounter (Signed)
Pt states she is using her Albuterol at least every 4 hrs for past week.  Increased SOB and some chest tightness since last week.  Denies wheezing or cough.  Pt seen primary md on 11/25/13 for sinus infection.  Finished ? abx 4 days ago.  Still has some sinus congestion and pnd.  Please advise.  Sending to doc of day.  No Known Allergies  Current Outpatient Prescriptions on File Prior to Visit  Medication Sig Dispense Refill  . acetaminophen (TYLENOL) 325 MG tablet Take 650 mg by mouth every 6 (six) hours as needed.      Marland Kitchen albuterol (PROVENTIL HFA;VENTOLIN HFA) 108 (90 BASE) MCG/ACT inhaler Inhale 2 puffs into the lungs every 6 (six) hours as needed for wheezing or shortness of breath.      . anastrozole (ARIMIDEX) 1 MG tablet Take 1 tablet (1 mg total) by mouth daily.  90 tablet  3  . Cholecalciferol (VITAMIN D) 2000 UNITS CAPS Take 1 capsule by mouth daily.      . Cyanocobalamin (VITAMIN B 12 PO) Take 1 tablet by mouth daily.      . hydrochlorothiazide (MICROZIDE) 12.5 MG capsule Take 12.5 mg by mouth daily.      Marland Kitchen lisinopril (PRINIVIL,ZESTRIL) 40 MG tablet Take 40 mg by mouth daily.      Marland Kitchen lovastatin (MEVACOR) 40 MG tablet Take 40 mg by mouth at bedtime.      Marland Kitchen omeprazole (PRILOSEC) 20 MG capsule Take 20 mg by mouth daily.      . Tiotropium Bromide Monohydrate (SPIRIVA RESPIMAT) 2.5 MCG/ACT AERS Inhale 2 puffs into the lungs daily.  4 g  4   No current facility-administered medications on file prior to visit.

## 2013-12-10 NOTE — Telephone Encounter (Signed)
Per SN:  PT needs f/u with TP or BQ  Spoke with pt and scheduled appt with Tammy Parrett on 12/12/13 at 4:15.  Pt instructed to go to ED is symptoms worsen prior to appt.

## 2013-12-12 ENCOUNTER — Encounter: Payer: Self-pay | Admitting: Adult Health

## 2013-12-12 ENCOUNTER — Ambulatory Visit (INDEPENDENT_AMBULATORY_CARE_PROVIDER_SITE_OTHER): Payer: Medicare Other | Admitting: Adult Health

## 2013-12-12 ENCOUNTER — Ambulatory Visit (INDEPENDENT_AMBULATORY_CARE_PROVIDER_SITE_OTHER)
Admission: RE | Admit: 2013-12-12 | Discharge: 2013-12-12 | Disposition: A | Discharge status: Home / Self Care | Payer: Medicare Other | Source: Ambulatory Visit | Attending: Adult Health | Admitting: Adult Health

## 2013-12-12 VITALS — BP 130/64 | HR 95 | Temp 97.5°F | Ht 61.0 in | Wt 186.8 lb

## 2013-12-12 DIAGNOSIS — J438 Other emphysema: Secondary | ICD-10-CM

## 2013-12-12 MED ORDER — CEFDINIR 300 MG PO CAPS: 300.0000 mg | ORAL_CAPSULE | Freq: Two times a day (BID) | ORAL | Status: DC

## 2013-12-12 MED ORDER — PREDNISONE 10 MG PO TABS: ORAL_TABLET | ORAL | Status: DC

## 2013-12-12 NOTE — Assessment & Plan Note (Signed)
Slow to resolve exacerbation  Check cxr today  May need to change ACE if recurrent flare in future   Plan  Begin Omnicef 300 mg twice daily for 7 days, take with food, eat yogurt daily while on antibiotic. Mucinex DM  twice daily as needed for cough congestion. Prednisone taper. Fluids and rest  Chest x-ray today. Follow-up with Dr. Lake Bells as planned and As needed   Please contact office for sooner follow up if symptoms do not improve or worsen or seek emergency care

## 2013-12-12 NOTE — Addendum Note (Signed)
Addended by: Parke Poisson E on: 12/12/2013 04:57 PM   Modules accepted: Orders

## 2013-12-12 NOTE — Progress Notes (Signed)
   Subjective:    Patient ID: Courtney Cline, female    DOB: 12/16/1933, 80 y.o.   MRN: [id number]  HPI Synopsis: GOLD Grade D COPD 2015 FEV1 33% pred, on 3L O2 continuously   12/12/2013 Acute OV  Presents for an acute office visit , complains of 2-3 weeks with cough, congestion , increased DOE, some wheezing and tightness, cough occasionally producing white/yellow mucus x2 weeks.   Using SABA every 3-4 hours.  Denies any f/c/s, n/v/d, hemoptysis, PND, leg swelling Seen by PCP rx Amoxicillin 2 weeks ago, did not help at all.   Recently dx with breast cancer , on armidex. Plans for lumpectomy next week.  Of note on ACE inhibitor .   Review of Systems Constitutional:   No  weight loss, night sweats,  Fevers, chills,  +fatigue, or  lassitude.  HEENT:   No headaches,  Difficulty swallowing,  Tooth/dental problems, or  Sore throat,                No sneezing, itching, ear ache, + nasal congestion, post nasal drip,   CV:  No chest pain,  Orthopnea, PND, s  anasarca, dizziness, palpitations, syncope.   GI  No heartburn, indigestion, abdominal pain, nausea, vomiting, diarrhea, change in bowel habits, loss of appetite, bloody stools.   Resp:    No chest wall deformity  Skin: no rash or lesions.  GU: no dysuria, change in color of urine, no urgency or frequency.  No flank pain, no hematuria   MS:  No joint pain or swelling.  No decreased range of motion.  No back pain.  Psych:  No change in mood or affect. No depression or anxiety.  No memory loss.         Objective:   Physical Exam  GEN: A/Ox3; pleasant , elderly , chronically ill appearing   HEENT:  [place]/AT,  EACs-clear, TMs-wnl, NOSE-clear, THROAT-clear, no lesions, no postnasal drip or exudate noted.   NECK:  Supple w/ fair ROM; no JVD; normal carotid impulses w/o bruits; no thyromegaly or nodules palpated; no lymphadenopathy.  RESP  Faint exp wheeze no accessory muscle use, no dullness to percussion  CARD:  RRR, no  m/r/g  , tr peripheral edema, pulses intact, no cyanosis or clubbing.  GI:   Soft & nt; nml bowel sounds; no organomegaly or masses detected.  Musco: Warm bil, no deformities or joint swelling noted.   Neuro: alert, no focal deficits noted.    Skin: Warm, no lesions or rashes        Assessment & Plan:

## 2013-12-12 NOTE — Patient Instructions (Addendum)
Begin Omnicef 300 mg twice daily for 7 days, take with food, eat yogurt daily while on antibiotic. Mucinex DM  twice daily as needed for cough congestion. Prednisone taper. Fluids and rest  Chest x-ray today. Follow-up with Dr. Lake Bells as planned and As needed   Please contact office for sooner follow up if symptoms do not improve or worsen or seek emergency care

## 2013-12-13 ENCOUNTER — Encounter (HOSPITAL_BASED_OUTPATIENT_CLINIC_OR_DEPARTMENT_OTHER): Payer: Self-pay | Admitting: *Deleted

## 2013-12-13 NOTE — Progress Notes (Signed)
Quick Note:  Called and spoke to pt. Informed pt of the results and recs per TP. Pt verbalized understanding and denied any further questions or concerns at this time. ______ 

## 2013-12-13 NOTE — Progress Notes (Signed)
I agree with this plan of care 

## 2013-12-13 NOTE — Progress Notes (Signed)
Pt on oxygen full time-had exacerbation of copd-saw dr 12/12/13-put on prednisone, antibiotics-pt states she already feels better- If well-come for labs after seeds 11/4

## 2013-12-13 NOTE — Progress Notes (Signed)
Reviewed with Dr Trixie Deis for [facility]

## 2013-12-16 ENCOUNTER — Telehealth: Payer: Self-pay | Admitting: Hematology

## 2013-12-16 ENCOUNTER — Ambulatory Visit (HOSPITAL_BASED_OUTPATIENT_CLINIC_OR_DEPARTMENT_OTHER): Payer: Medicare Other | Admitting: Hematology

## 2013-12-16 ENCOUNTER — Other Ambulatory Visit: Payer: Medicare Other

## 2013-12-16 ENCOUNTER — Encounter: Payer: Self-pay | Admitting: Hematology

## 2013-12-16 VITALS — BP 136/68 | HR 97 | Temp 97.8°F | Resp 18 | Ht 61.0 in | Wt 182.8 lb

## 2013-12-16 DIAGNOSIS — J449 Chronic obstructive pulmonary disease, unspecified: Secondary | ICD-10-CM

## 2013-12-16 DIAGNOSIS — C50912 Malignant neoplasm of unspecified site of left female breast: Secondary | ICD-10-CM | POA: Diagnosis not present

## 2013-12-16 NOTE — Progress Notes (Signed)
[facility] CONSULT NOTE Date of Visit: 12/16/2013   Patient Care Team: Antony Blackbird, MD as PCP - General (Family Medicine) Simonne Maffucci - Pulmonary Erroll Luna - Surgery  CHIEF COMPLAINTS/PURPOSE OF CONSULTATION:   Left Breast cancer  HISTORY OF PRESENTING ILLNESS:   Courtney Cline 80 y.o. female from [place] referred here for recent diagnosis of left breast cancer.  I tabulated her oncologic history below.  SUMMARY OF ONCOLOGIC HISTORY:   Breast cancer of upper-inner quadrant of right female breast   12/06/2012 Imaging Bone Density performed at [facility] physicians T score Lumbar spine -0.1 (-0.7 in 2012), Right neck femur -1.4 (-1.2 before), Left neck femur -1.4 (-1.5 before) and Interpretation is OSTEOPENIA by WHO criteria.    10/11/2013 Mammogram Abnormal Screening mammogram showing Right Breast mass.   10/23/2013 Breast US Diagnostic mammogram and US showed an irregular hypoechoic mass at 9'0 clock position right breast 8 cm from nipple. [place] measurement 1.2x0.7x1.1 cm, no axillary adenopathy.   10/23/2013 Initial Biopsy US guided biopsy  done with clip placement.   10/23/2013 Pathology Results Invasive ductal carcinoma (IDC), DCIS, invasive cancer grade 2, ER 100%+, PR 100%+, Ki-67% 7%, HER-2/NEU by CISH No amplification, ratio of her2:cep17 1.00, average her2 copy number per cell 1.95 (HER 2 Negative tumor). Molecular Classification LUMINAL A.    Clinical Stage T1C,N0,MX  (Stage 1-A)   11/01/2013 Surgery Initial surgical evaluation by Dr Marcello Moores Cornett: "Not good surgical candidate because of pulmonary status". Recommended anti-estrogen therapy.   11/18/2013 -  Neo-Adjuvant Anti-estrogen oral therapy Patient started on Arimidex today after initial evaluation (Dr Lona Kettle). Also discussed with Dr Lake Bells and Dr Brantley Stage to reconsider surgery after optimization of her pulmonary status, perhaps with Local anesthesia.   12/16/2013 Surgery Patient seen in Oncology  clinic and have surgery planned for 12/19/2013. she will be seen by Dr Burr Medico for follow-up after that. Continue Arimidex.   I spoke today with Dr Brantley Stage her surgeon and also Dr Jana Hakim on last visit on 11/18/2013. Ideally her chances of cure are maximum if we can resect the tumor (wide excision, lumpectomy, mastectomy) but because of her COPD and oxygen dependence, Dr Brantley Stage wanted to seek alternative options. I discussed with Dr Brantley Stage if we can consider a surgery under local anesthesia the lumpectomy, not a SLN biopsy. I did start patient on Arimidex which can potentially control the cancer but does not give a curative potential to patient. Dr Lake Bells said that he would be willing to see patient to further optimize her pulmonary status but categorize her as a "Moderate" risk for anesthesia complication because of the nature of surgery. She smoked for 40+ years and quit smoking 21 years ago. Her FEV1 was 480 cc as per DR McQuaid.   In terms of breast cancer risk profile:  She menarched at early age of 74 and went to menopause at age 69.  She had 2 pregnancy, her first child was born at age 34.  She did not  received birth control pills.   She was never exposed to fertility medications or hormone replacement therapy.  She has no family history of Breast/GYN/GI cancer. Husband had Kidney cancer.she never had colonoscopy.  MEDICAL HISTORY:  Past Medical History  Diagnosis Date  . Hyperlipidemia   . Hypertension   . GERD (gastroesophageal reflux disease)   . COPD (chronic obstructive pulmonary disease)   . Osteopenia   . Breast calcification seen on mammogram     Right breast  . Hyperglycemia   .  Peripheral edema   . Vitamin D deficiency   . Allergic rhinitis   . Low back pain   . Wears glasses     reading  . Wears dentures     top  . On home oxygen therapy     all the time  . Shortness of breath   . Arthritis     SURGICAL HISTORY: Past Surgical History  Procedure Laterality Date   . Cataract extraction      both eyes    SOCIAL HISTORY: History   Social History  . Marital Status: Married    Spouse Name: [name]    Number of Children: [name]  . Years of Education: [name]   Occupational History  . retired     Chartered certified accountant   Social History Main Topics  . Smoking status: Former Smoker -- 1.00 packs/day for 40 years    Types: Cigarettes    Quit date: 02/14/1994  . Smokeless tobacco: Never Used  . Alcohol Use: No  . Drug Use: No  . Sexual Activity: Not on file   Other Topics Concern  . Not on file   Social History Narrative    FAMILY HISTORY: Family History  Problem Relation Age of Onset  . Emphysema Mother   . Emphysema Maternal Grandfather     ALLERGIES:  has No Known Allergies.  MEDICATIONS:  Current Outpatient Prescriptions  Medication Sig Dispense Refill  . acetaminophen (TYLENOL) 325 MG tablet Take 650 mg by mouth every 6 (six) hours as needed.    Marland Kitchen albuterol (PROVENTIL HFA;VENTOLIN HFA) 108 (90 BASE) MCG/ACT inhaler Inhale 2 puffs into the lungs every 6 (six) hours as needed for wheezing or shortness of breath.    . anastrozole (ARIMIDEX) 1 MG tablet Take 1 tablet (1 mg total) by mouth daily. 90 tablet 3  . cefdinir (OMNICEF) 300 MG capsule Take 1 capsule (300 mg total) by mouth 2 (two) times daily. 14 capsule 0  . Cholecalciferol (VITAMIN D) 2000 UNITS CAPS Take 1 capsule by mouth daily.    . Cyanocobalamin (VITAMIN B 12 PO) Take 1 tablet by mouth daily.    . hydrochlorothiazide (MICROZIDE) 12.5 MG capsule Take 12.5 mg by mouth daily.    Marland Kitchen lisinopril (PRINIVIL,ZESTRIL) 40 MG tablet Take 40 mg by mouth daily.    Marland Kitchen lovastatin (MEVACOR) 40 MG tablet Take 40 mg by mouth at bedtime.    Marland Kitchen omeprazole (PRILOSEC) 20 MG capsule Take 20 mg by mouth daily.    . predniSONE (DELTASONE) 10 MG tablet 4 tabs for 2 days,   2 tabs for 2 days, then 1 tab for 2 days, then stop 14 tablet 0  . Tiotropium Bromide Monohydrate (SPIRIVA RESPIMAT) 2.5 MCG/ACT AERS Inhale 2  puffs into the lungs daily. 4 g 4   No current facility-administered medications for this visit.    Review of Systems  Constitutional: Negative for fever, chills, weight loss, malaise/fatigue and diaphoresis.  HENT: Negative for congestion, ear discharge, ear pain, hearing loss, nosebleeds, sore throat and tinnitus.   Eyes: Negative for blurred vision, double vision, photophobia, pain, discharge and redness.  Respiratory: Positive for shortness of breath. Negative for cough, hemoptysis, sputum production, wheezing and stridor.   Cardiovascular: Negative for chest pain, palpitations, orthopnea, claudication, leg swelling and PND.  Gastrointestinal: Negative for heartburn, nausea, vomiting, abdominal pain, diarrhea, constipation, blood in stool and melena.  Genitourinary: Negative for dysuria, urgency, frequency, hematuria and flank pain.  Musculoskeletal: Negative for myalgias, back pain, joint pain,  falls and neck pain.  Skin: Negative for itching and rash.  Neurological: Negative for dizziness, tingling, tremors, sensory change, speech change, focal weakness, seizures, loss of consciousness, weakness and headaches.  Endo/Heme/Allergies: Negative for environmental allergies and polydipsia. Does not bruise/bleed easily.  Psychiatric/Behavioral: Negative for depression, suicidal ideas, memory loss and substance abuse. The patient is not nervous/anxious and does not have insomnia.      PHYSICAL EXAMINATION: ECOG PERFORMANCE STATUS: 1  Filed Vitals:   12/16/13 1335  BP: 136/68  Pulse: 97  Temp: 97.8 F (36.6 C)  Resp: 18   Filed Weights   12/16/13 1335  Weight: 182 lb 12.8 oz (82.918 kg)    Physical Exam  Constitutional: She is oriented to person, place, and time. She appears well-developed and well-nourished.  HENT:  Head: Normocephalic and atraumatic.  Eyes: Conjunctivae and EOM are normal. Pupils are equal, round, and reactive to light.  Neck: Normal range of motion. Neck  supple. No JVD present. No thyromegaly present.  Cardiovascular: Normal rate, regular rhythm, normal heart sounds and intact distal pulses.  Exam reveals no gallop and no friction rub.   No murmur heard. Pulmonary/Chest: Effort normal and breath sounds normal. No respiratory distress. She has no wheezes. She has no rales. Right breast exhibits mass.  Abdominal: Soft. She exhibits no distension and no mass. There is no tenderness. No hernia.  Musculoskeletal: Normal range of motion. She exhibits no edema or tenderness.  Lymphadenopathy:    She has no cervical adenopathy.  Neurological: She is alert and oriented to person, place, and time. She has normal reflexes.  Skin: Skin is warm and dry. No rash noted. No erythema. No pallor.  Psychiatric: She has a normal mood and affect. Her behavior is normal. Judgment and thought content normal.    LABORATORY DATA:  I have reviewed the data as listed  RADIOGRAPHIC STUDIES: I have personally reviewed the radiological images as listed and agreed with the findings in the report. Mammogram and [place].  ASSESSMENT/PLAN:   31. 80 years old female with a Right Breast cancer. Clinical stage stage 1 (T1C,N0,M0). The tumor is Invasive ductal cancer with DCIS, Grade 2, ER+, PR+, HER2 negative, LUMINAL A molecular type.  2. Patient with Moderate Risk of anesthesia Complications as she has COPD and on home oxygen.  3. I discussed the case with the surgeon, her pulmonologist and Dr Jana Hakim. She is getting a surgery done under local anesthesia on 12/19/2013.  4. In the mean time I have placed her on Arimidex so as to start the endocrine therapy and prevent any further metastasis.   5. She will be seen her again in December with Dr Burr Medico as I am leaving and she will take over the case. Her prognosis is good but if she can tolerate a surgery or wide excision, it will give her the best chance for longevity and cure.    All questions were answered. The patient knows  to call the clinic with any problems, questions or concerns. I spent 30 minutes counseling the patient face to face. The total time spent in the appointment was 1 hour.     Bernadene Bell, MD Medical Hematologist/Oncologist [place] Pager: [phone] Office No: [phone]

## 2013-12-16 NOTE — Telephone Encounter (Signed)
Pt confirmed MD per 11/02 POF, pt is to be sch w/Dr. Burr Medico once template is set up, gave pt AVS.... KJ

## 2013-12-18 ENCOUNTER — Ambulatory Visit
Admission: RE | Admit: 2013-12-18 | Discharge: 2013-12-18 | Disposition: A | Discharge status: Home / Self Care | Payer: Medicare Other | Source: Ambulatory Visit | Attending: Surgery | Admitting: Surgery

## 2013-12-18 ENCOUNTER — Encounter (HOSPITAL_BASED_OUTPATIENT_CLINIC_OR_DEPARTMENT_OTHER)
Admission: RE | Admit: 2013-12-18 | Discharge: 2013-12-18 | Disposition: A | Discharge status: Home / Self Care | Payer: Medicare Other | Source: Ambulatory Visit | Attending: Surgery | Admitting: Surgery

## 2013-12-18 ENCOUNTER — Other Ambulatory Visit: Payer: Self-pay

## 2013-12-18 DIAGNOSIS — Z87891 Personal history of nicotine dependence: Secondary | ICD-10-CM | POA: Diagnosis not present

## 2013-12-18 DIAGNOSIS — Z9981 Dependence on supplemental oxygen: Secondary | ICD-10-CM | POA: Diagnosis not present

## 2013-12-18 DIAGNOSIS — N6011 Diffuse cystic mastopathy of right breast: Secondary | ICD-10-CM | POA: Diagnosis not present

## 2013-12-18 DIAGNOSIS — J449 Chronic obstructive pulmonary disease, unspecified: Secondary | ICD-10-CM | POA: Diagnosis not present

## 2013-12-18 DIAGNOSIS — C50911 Malignant neoplasm of unspecified site of right female breast: Secondary | ICD-10-CM | POA: Diagnosis present

## 2013-12-18 DIAGNOSIS — K219 Gastro-esophageal reflux disease without esophagitis: Secondary | ICD-10-CM | POA: Diagnosis not present

## 2013-12-18 LAB — COMPREHENSIVE METABOLIC PANEL
ALBUMIN: 3.9 g/dL (ref 3.5–5.2)
ALT: 11 U/L (ref 0–35)
AST: 13 U/L (ref 0–37)
Alkaline Phosphatase: 60 U/L (ref 39–117)
Anion gap: 14 (ref 5–15)
BUN: 28 mg/dL — ABNORMAL HIGH (ref 6–23)
CO2: 27 mEq/L (ref 19–32)
Calcium: 9.6 mg/dL (ref 8.4–10.5)
Chloride: 99 mEq/L (ref 96–112)
Creatinine, Ser: 0.95 mg/dL (ref 0.50–1.10)
GFR calc Af Amer: 65 mL/min — ABNORMAL LOW (ref >90)
GFR calc non Af Amer: 56 mL/min — ABNORMAL LOW (ref >90)
Glucose, Bld: 97 mg/dL (ref 70–99)
Potassium: 3.8 mEq/L (ref 3.7–5.3)
SODIUM: 140 meq/L (ref 137–147)
TOTAL PROTEIN: 7.5 g/dL (ref 6.0–8.3)

## 2013-12-18 LAB — CBC WITH DIFFERENTIAL/PLATELET
BASOS PCT: 0 % (ref 0–1)
Basophils Absolute: 0 10*3/uL (ref 0.0–0.1)
EOS ABS: 0.2 10*3/uL (ref 0.0–0.7)
Eosinophils Relative: 2 % (ref 0–5)
HCT: 35.5 % — ABNORMAL LOW (ref 36.0–46.0)
HEMOGLOBIN: 11.1 g/dL — AB (ref 12.0–15.0)
LYMPHS ABS: 1.7 10*3/uL (ref 0.7–4.0)
Lymphocytes Relative: 17 % (ref 12–46)
MCH: 30.7 pg (ref 26.0–34.0)
MCHC: 31.3 g/dL (ref 30.0–36.0)
MCV: 98.3 fL (ref 78.0–100.0)
MONOS PCT: 8 % (ref 3–12)
Monocytes Absolute: 0.8 10*3/uL (ref 0.1–1.0)
NEUTROS PCT: 73 % (ref 43–77)
Neutro Abs: 7.1 10*3/uL (ref 1.7–7.7)
PLATELETS: 310 10*3/uL (ref 150–400)
RBC: 3.61 MIL/uL — AB (ref 3.87–5.11)
RDW: 13.4 % (ref 11.5–15.5)
WBC: 9.7 10*3/uL (ref 4.0–10.5)

## 2013-12-20 ENCOUNTER — Encounter (HOSPITAL_BASED_OUTPATIENT_CLINIC_OR_DEPARTMENT_OTHER): Payer: Self-pay | Admitting: *Deleted

## 2013-12-20 ENCOUNTER — Ambulatory Visit (HOSPITAL_BASED_OUTPATIENT_CLINIC_OR_DEPARTMENT_OTHER)
Admission: RE | Admit: 2013-12-20 | Discharge: 2013-12-20 | Disposition: A | Discharge status: Home / Self Care | Payer: Medicare Other | Source: Ambulatory Visit | Attending: Surgery | Admitting: Surgery

## 2013-12-20 ENCOUNTER — Encounter (HOSPITAL_BASED_OUTPATIENT_CLINIC_OR_DEPARTMENT_OTHER)
Admission: RE | Disposition: A | Discharge status: Home / Self Care | Payer: Self-pay | Source: Ambulatory Visit | Attending: Surgery

## 2013-12-20 ENCOUNTER — Ambulatory Visit
Admission: RE | Admit: 2013-12-20 | Discharge: 2013-12-20 | Disposition: A | Discharge status: Home / Self Care | Payer: Medicare Other | Source: Ambulatory Visit | Attending: Surgery | Admitting: Surgery

## 2013-12-20 ENCOUNTER — Ambulatory Visit (HOSPITAL_BASED_OUTPATIENT_CLINIC_OR_DEPARTMENT_OTHER): Payer: Medicare Other | Admitting: Anesthesiology

## 2013-12-20 DIAGNOSIS — N6011 Diffuse cystic mastopathy of right breast: Secondary | ICD-10-CM | POA: Insufficient documentation

## 2013-12-20 DIAGNOSIS — Z9981 Dependence on supplemental oxygen: Secondary | ICD-10-CM | POA: Insufficient documentation

## 2013-12-20 DIAGNOSIS — J449 Chronic obstructive pulmonary disease, unspecified: Secondary | ICD-10-CM | POA: Insufficient documentation

## 2013-12-20 DIAGNOSIS — Z87891 Personal history of nicotine dependence: Secondary | ICD-10-CM | POA: Insufficient documentation

## 2013-12-20 DIAGNOSIS — C50911 Malignant neoplasm of unspecified site of right female breast: Secondary | ICD-10-CM | POA: Insufficient documentation

## 2013-12-20 DIAGNOSIS — K219 Gastro-esophageal reflux disease without esophagitis: Secondary | ICD-10-CM | POA: Insufficient documentation

## 2013-12-20 HISTORY — PX: BREAST LUMPECTOMY WITH RADIOACTIVE SEED LOCALIZATION: SHX6424

## 2013-12-20 HISTORY — DX: Presence of spectacles and contact lenses: Z97.3

## 2013-12-20 HISTORY — DX: Shortness of breath: R06.02

## 2013-12-20 HISTORY — DX: Unspecified osteoarthritis, unspecified site: M19.90

## 2013-12-20 HISTORY — DX: Dependence on supplemental oxygen: Z99.81

## 2013-12-20 HISTORY — DX: Presence of dental prosthetic device (complete) (partial): Z97.2

## 2013-12-20 SURGERY — BREAST LUMPECTOMY WITH RADIOACTIVE SEED LOCALIZATION
Anesthesia: Monitor Anesthesia Care | Site: Breast | Laterality: Right

## 2013-12-20 MED ORDER — MIDAZOLAM HCL 2 MG/2ML IJ SOLN
1.0000 mg | INTRAMUSCULAR | Status: DC | PRN
Start: 2013-12-20 — End: 2013-12-20

## 2013-12-20 MED ORDER — FENTANYL CITRATE 0.05 MG/ML IJ SOLN
INTRAMUSCULAR | Status: DC | PRN
  Administered 2013-12-20: 25 ug via INTRAVENOUS
  Administered 2013-12-20: 50 ug via INTRAVENOUS
  Administered 2013-12-20: 25 ug via INTRAVENOUS

## 2013-12-20 MED ORDER — PROPOFOL 10 MG/ML IV BOLUS
INTRAVENOUS | Status: AC
  Filled 2013-12-20: qty 20

## 2013-12-20 MED ORDER — HYDROMORPHONE HCL 1 MG/ML IJ SOLN: 0.2500 mg | INTRAMUSCULAR | Status: DC | PRN

## 2013-12-20 MED ORDER — CEFAZOLIN SODIUM-DEXTROSE 2-3 GM-% IV SOLR
INTRAVENOUS | Status: AC
  Filled 2013-12-20: qty 50

## 2013-12-20 MED ORDER — CEFAZOLIN SODIUM-DEXTROSE 2-3 GM-% IV SOLR
2.0000 g | INTRAVENOUS | Status: AC
  Administered 2013-12-20: 2 g via INTRAVENOUS

## 2013-12-20 MED ORDER — BUPIVACAINE-EPINEPHRINE (PF) 0.25% -1:200000 IJ SOLN
INTRAMUSCULAR | Status: AC
  Filled 2013-12-20: qty 30

## 2013-12-20 MED ORDER — FENTANYL CITRATE 0.05 MG/ML IJ SOLN: 50.0000 ug | INTRAMUSCULAR | Status: DC | PRN

## 2013-12-20 MED ORDER — OXYCODONE HCL 5 MG PO TABS: 5.0000 mg | ORAL_TABLET | Freq: Once | ORAL | Status: DC | PRN

## 2013-12-20 MED ORDER — SCOPOLAMINE 1 MG/3DAYS TD PT72: 1.0000 | MEDICATED_PATCH | TRANSDERMAL | Status: DC

## 2013-12-20 MED ORDER — LACTATED RINGERS IV SOLN
INTRAVENOUS | Status: DC
  Administered 2013-12-20: 07:00:00 via INTRAVENOUS

## 2013-12-20 MED ORDER — OXYCODONE HCL 5 MG/5ML PO SOLN: 5.0000 mg | Freq: Once | ORAL | Status: DC | PRN

## 2013-12-20 MED ORDER — PROPOFOL INFUSION 10 MG/ML OPTIME
INTRAVENOUS | Status: DC | PRN
  Administered 2013-12-20: 100 ug/kg/min via INTRAVENOUS

## 2013-12-20 MED ORDER — PROPOFOL INFUSION 10 MG/ML OPTIME: INTRAVENOUS | Status: DC | PRN

## 2013-12-20 MED ORDER — HYDROCODONE-ACETAMINOPHEN 5-325 MG PO TABS: 1.0000 | ORAL_TABLET | Freq: Four times a day (QID) | ORAL | Status: DC | PRN

## 2013-12-20 MED ORDER — ONDANSETRON HCL 4 MG/2ML IJ SOLN
INTRAMUSCULAR | Status: DC | PRN
  Administered 2013-12-20: 4 mg via INTRAVENOUS

## 2013-12-20 MED ORDER — LIDOCAINE HCL (CARDIAC) 20 MG/ML IV SOLN
INTRAVENOUS | Status: DC | PRN
  Administered 2013-12-20: 25 mg via INTRAVENOUS

## 2013-12-20 MED ORDER — FENTANYL CITRATE 0.05 MG/ML IJ SOLN
INTRAMUSCULAR | Status: AC
  Filled 2013-12-20: qty 4

## 2013-12-20 MED ORDER — PROMETHAZINE HCL 25 MG/ML IJ SOLN: 6.2500 mg | INTRAMUSCULAR | Status: DC | PRN

## 2013-12-20 MED ORDER — BUPIVACAINE-EPINEPHRINE (PF) 0.25% -1:200000 IJ SOLN
INTRAMUSCULAR | Status: DC | PRN
  Administered 2013-12-20: 56 mL

## 2013-12-20 SURGICAL SUPPLY — 54 items
APPLIER CLIP 9.375 MED OPEN (MISCELLANEOUS)
APR CLP MED 9.3 20 MLT OPN (MISCELLANEOUS)
BINDER BREAST LRG (GAUZE/BANDAGES/DRESSINGS) ×2 IMPLANT
BINDER BREAST MEDIUM (GAUZE/BANDAGES/DRESSINGS) IMPLANT
BINDER BREAST XLRG (GAUZE/BANDAGES/DRESSINGS) IMPLANT
BINDER BREAST XXLRG (GAUZE/BANDAGES/DRESSINGS) IMPLANT
BLADE SURG 15 STRL LF DISP TIS (BLADE) ×1 IMPLANT
BLADE SURG 15 STRL SS (BLADE) ×3
CANISTER SUC SOCK COL 7IN (MISCELLANEOUS) ×1 IMPLANT
CANISTER SUCT 1200ML W/VALVE (MISCELLANEOUS) IMPLANT
CHLORAPREP W/TINT 26ML (MISCELLANEOUS) ×3 IMPLANT
CLIP APPLIE 9.375 MED OPEN (MISCELLANEOUS) IMPLANT
CLIP TI WIDE RED SMALL 6 (CLIP) ×3 IMPLANT
COVER BACK TABLE 60X90IN (DRAPES) ×3 IMPLANT
COVER MAYO STAND STRL (DRAPES) ×3 IMPLANT
COVER PROBE W GEL 5X96 (DRAPES) ×3 IMPLANT
DECANTER SPIKE VIAL GLASS SM (MISCELLANEOUS) IMPLANT
DEVICE DUBIN W/COMP PLATE 8390 (MISCELLANEOUS) ×3 IMPLANT
DRAPE LAPAROSCOPIC ABDOMINAL (DRAPES) ×2 IMPLANT
DRAPE PED LAPAROTOMY (DRAPES) ×1 IMPLANT
DRAPE UTILITY XL STRL (DRAPES) ×3 IMPLANT
ELECT COATED BLADE 2.86 ST (ELECTRODE) ×3 IMPLANT
ELECT REM PT RETURN 9FT ADLT (ELECTROSURGICAL) ×3
ELECTRODE REM PT RTRN 9FT ADLT (ELECTROSURGICAL) ×1 IMPLANT
GLOVE BIOGEL PI IND STRL 7.0 (GLOVE) IMPLANT
GLOVE BIOGEL PI IND STRL 8 (GLOVE) ×1 IMPLANT
GLOVE BIOGEL PI INDICATOR 7.0 (GLOVE) ×2
GLOVE BIOGEL PI INDICATOR 8 (GLOVE) ×2
GLOVE ECLIPSE 6.5 STRL STRAW (GLOVE) ×2 IMPLANT
GLOVE ECLIPSE 8.0 STRL XLNG CF (GLOVE) ×3 IMPLANT
GLOVE EPREMIER NITRL EXT CFF L (GLOVE) IMPLANT
GLOVE EXAM NITRILE EXT CFF LRG (GLOVE) ×3 IMPLANT
GOWN STRL REUS W/ TWL LRG LVL3 (GOWN DISPOSABLE) ×2 IMPLANT
GOWN STRL REUS W/TWL LRG LVL3 (GOWN DISPOSABLE) ×6
KIT MARKER MARGIN INK (KITS) ×3 IMPLANT
LIQUID BAND (GAUZE/BANDAGES/DRESSINGS) ×3 IMPLANT
NDL HYPO 25X1 1.5 SAFETY (NEEDLE) ×1 IMPLANT
NEEDLE HYPO 25X1 1.5 SAFETY (NEEDLE) ×3 IMPLANT
NS IRRIG 1000ML POUR BTL (IV SOLUTION) ×3 IMPLANT
PACK BASIN DAY SURGERY FS (CUSTOM PROCEDURE TRAY) ×3 IMPLANT
PENCIL BUTTON HOLSTER BLD 10FT (ELECTRODE) ×3 IMPLANT
SLEEVE SCD COMPRESS KNEE MED (MISCELLANEOUS) ×3 IMPLANT
SPONGE LAP 4X18 X RAY DECT (DISPOSABLE) ×3 IMPLANT
STAPLER VISISTAT 35W (STAPLE) IMPLANT
SUT MNCRL AB 4-0 PS2 18 (SUTURE) ×3 IMPLANT
SUT SILK 2 0 SH (SUTURE) IMPLANT
SUT VIC AB 3-0 SH 27 (SUTURE) ×3
SUT VIC AB 3-0 SH 27X BRD (SUTURE) ×1 IMPLANT
SYR CONTROL 10ML LL (SYRINGE) ×3 IMPLANT
TOWEL OR 17X24 6PK STRL BLUE (TOWEL DISPOSABLE) ×3 IMPLANT
TOWEL OR NON WOVEN STRL DISP B (DISPOSABLE) ×1 IMPLANT
TUBE CONNECTING 20'X1/4 (TUBING) ×1
TUBE CONNECTING 20X1/4 (TUBING) ×1 IMPLANT
YANKAUER SUCT BULB TIP NO VENT (SUCTIONS) ×2 IMPLANT

## 2013-12-20 NOTE — Anesthesia Postprocedure Evaluation (Signed)
  Anesthesia Post-op Note  Patient: Courtney Cline  Procedure(s) Performed: Procedure(s): RIGHT BREAST LUMPECTOMY WITH RADIOACTIVE SEED LOCALIZATION (Right)  Patient Location: PACU  Anesthesia Type:MAC  Level of Consciousness: awake and alert   Airway and Oxygen Therapy: Patient Spontanous Breathing  Post-op Pain: none  Post-op Assessment: Post-op Vital signs reviewed  Post-op Vital Signs: stable  Last Vitals:  Filed Vitals:   12/20/13 0915  BP: 130/54  Pulse: 89  Temp:   Resp: 23    Complications: No apparent anesthesia complications

## 2013-12-20 NOTE — Brief Op Note (Signed)
12/20/2013  8:44 AM  PATIENT:  Courtney Cline  80 y.o. female  PRE-OPERATIVE DIAGNOSIS:  Breast Cancer  POST-OPERATIVE DIAGNOSIS:  Breast Cancer  PROCEDURE:  Procedure(s): RIGHT BREAST LUMPECTOMY WITH RADIOACTIVE SEED LOCALIZATION (Right)  SURGEON:  Surgeon(s) and Role:    * Erroll Luna, MD - Primary      ANESTHESIA:   local and IV sedation  EBL:  Total I/O In: 850 [I.V.:850] Out: -   BLOOD ADMINISTERED:none  DRAINS: none   LOCAL MEDICATIONS USED:  MARCAINE     SPECIMEN:  Source of Specimen:  right breast mass  DISPOSITION OF SPECIMEN:  PATHOLOGY  COUNTS:  YES  TOURNIQUET:  * No tourniquets in log *  DICTATION: .Other Dictation: Dictation Number [id number]  PLAN OF CARE: Discharge to home after PACU  PATIENT DISPOSITION:  PACU - hemodynamically stable.   Delay start of Pharmacological VTE agent (>24hrs) due to surgical blood loss or risk of bleeding: yes

## 2013-12-20 NOTE — H&P (View-Only) (Signed)
Courtney Cline 12/06/2013 12:05 PM Location: [facility] Patient #: [id number] DOB: [birth year] Married / Language: English / Race: White Female History of Present Illness Courtney Cline A. [name] MD; 12/06/2013 12:33 PM) Patient words: Eval for lumpectomy on Rt breast   CLINICAL DATA: Possible mass right breast identified on recent screening mammogram. EXAM: DIGITAL DIAGNOSTIC RIGHT MAMMOGRAM ULTRASOUND RIGHT BREAST COMPARISON: 10/11/2013 ACR Breast Density Category b: There are scattered areas of fibroglandular density. FINDINGS: Additional views performed today confirm an irregular mass with spiculated margins in the posterior third of the outer right breast. On physical exam, I do not palpate a discrete mass in the outer right breast. Ultrasound is performed, showing an irregular hypoechoic mass at 9 o'clock position right breast 8 cm from the nipple. This measures 1.2 x 0.7 x 1.1 cm and contains some internal vascular flow. Ultrasound of the right axilla shows no evidence of lymphadenopathy. IMPRESSION: 1.2 cm suspicious mass at 9 o'clock position right breast. RECOMMENDATION: Ultrasound-guided core needle biopsy of the right breast mass is recommended. The patient is scheduled to have the biopsy performed later today. I have discussed the findings and recommendations with the patient. Results were also provided in writing at the conclusion of the visit. If applicable, a reminder letter will be sent to the patient regarding the next appointment. BI-RADS CATEGORY 5: Highly suggestive of malignancy. Electronically Signed By: Britta Mccreedy M.D. On: 10/23/2013 12:48.  The patient is a 80 year old female who presents with breast cancer. The patient is being seen for a consultation for Stage I invasive ductal carcinoma of the right breast. Tumor markers include estrogen receptor positive, progesterone receptor positive and HER-2/neu negative. No associated conditions  are noted. The patient was referred by a specialty consultant. Initial presentation was for an abnormal mammogram. Current diagnosis was determined by mammography, breast ultrasound and core needle biopsy. This problem has not been previously evaluated. This problem has not been previously treated.    PT SEEN BY ONCOLOGY AND PULMONOLOGIST AND FELT TO BE SUITABLE FOR LUMPECTOMY. ONCOLOGY FELT THAT THIS WOULD BE BEST OPTION FOR LOCAL CONTROL.  The patient is a 80 year old female   Allergies Jake Church, LPN; 04/54/0981 12:06 PM) No Known Drug Allergies 11/01/2013  Medication History Jake Church, LPN; 19/14/7829 12:09 PM) Albuterol Sulfate (108 (90 Base)MCG/ACT Aero Pow Br Act, Inhalation) Active. Vitamin D (2000UNIT Capsule, Oral) Active. Vitamin B12 TR ( Tablet ER, Oral) Active. Microzide (12.5MG  Capsule, Oral) Active. Lisinopril (40MG  Tablet, Oral) Active. Mevacor (40MG  Tablet, Oral) Active. PriLOSEC OTC (20MG  Tablet DR, Oral) Active. Tiotropium Bromide Monohydrate (2.5MCG/ACT Aerosol Soln, Inhalation) Active. Arimidex (1MG  Tablet, Oral) Active. Amoxicillin ER (775MG  Tablet ER 24HR, Oral) Active.    Vitals Jake Church LPN; 56/21/3086 12:10 PM) 12/06/2013 12:09 PM Weight: 183.25 lb Height: 61.5in Body Surface Area: 1.9 m Body Mass Index: 34.06 kg/m Temp.: 97.69F(Tympanic)  Pulse: 66 (Regular)  Resp.: 24 (Wheezing)  BP: 140/60 (Sitting, Left Arm, Standard)     Physical Exam (Maison Agrusa A. Nyonna Hargrove MD; 12/06/2013 12:34 PM)  General Mental Status-Alert. General Appearance-Consistent with stated age. Hydration-Well hydrated. Voice-Normal.  Head and Neck Head-normocephalic, atraumatic with no lesions or palpable masses.  Eye Eyeball - Bilateral-Extraocular movements intact. Sclera/Conjunctiva - Bilateral-No scleral icterus.  Chest and Lung Exam Note: ON HOME O2 NOT sob bs CLEAR   Breast Note: NOT  REPEATED   Cardiovascular Cardiovascular examination reveals -on palpation PMI is normal in location and amplitude, no palpable S3 or S4. Normal cardiac borders., normal heart  sounds, regular rate and rhythm with no murmurs, carotid auscultation reveals no bruits and normal pedal pulses bilaterally.  Musculoskeletal Normal Exam - Left-Upper Extremity Strength Normal and Lower Extremity Strength Normal. Normal Exam - Right-Upper Extremity Strength Normal, Lower Extremity Weakness.    Assessment & Plan (Earley Grobe A. Tevyn Codd MD; 12/06/2013 12:30 PM)  BREAST CANCER, RIGHT (174.9  C50.911) Impression: SEEN BY ONCOLOGY AND PULMONARY. FELT TO BE STABLE ENOUGH FOR LUMPECTOMY. WILL NEED REGIONAL BLOCK AND LOCAL. TRY TO DO UNDER MAC. STILL HIGH RISK BUT OK WITH THAT. Risk of lumpectomy include bleeding, infection, seroma, more surgery, use of seed/wire, wound care, cosmetic deformity and the need for other treatments, death , blood clots, death. Pt agrees to proceed.  Current Plans Pt Education - CSS Breast Biopsy Instructions (FLB): discussed with patient and provided information.

## 2013-12-20 NOTE — Anesthesia Preprocedure Evaluation (Addendum)
Anesthesia Evaluation  Patient identified by MRN, date of birth, ID band Patient awake    Reviewed: Allergy & Precautions, H&P , NPO status , Patient's Chart, lab work & pertinent test results  Airway Mallampati: I       Dental  (+) Poor Dentition, Upper Dentures, Dental Advisory Given   Pulmonary COPD COPD inhaler, former smoker,    Pulmonary exam normal       Cardiovascular hypertension, Pt. on medications     Neuro/Psych negative neurological ROS  negative psych ROS   GI/Hepatic GERD-  Medicated,  Endo/Other    Renal/GU      Musculoskeletal   Abdominal   Peds  Hematology   Anesthesia Other Findings   Reproductive/Obstetrics                            Anesthesia Physical Anesthesia Plan  ASA: III  Anesthesia Plan: MAC   Post-op Pain Management:    Induction: Intravenous  Airway Management Planned: Simple Face Mask  Additional Equipment:   Intra-op Plan:   Post-operative Plan: Extubation in OR  Informed Consent: I have reviewed the patients History and Physical, chart, labs and discussed the procedure including the risks, benefits and alternatives for the proposed anesthesia with the patient or authorized representative who has indicated his/her understanding and acceptance.   Consent reviewed with POA  Plan Discussed with: CRNA, Anesthesiologist and Surgeon  Anesthesia Plan Comments:        Anesthesia Quick Evaluation

## 2013-12-20 NOTE — Op Note (Signed)
NAMEKIAN, GAMARRA            ACCOUNT NO.:  [id number]  MEDICAL RECORD NO.:  [id number]  LOCATION:                                 FACILITY:  PHYSICIAN:  Marcello Moores A. Mashawn Brazil, M.D.     DATE OF BIRTH:  DATE OF PROCEDURE:  12/20/2013 DATE OF DISCHARGE:                              OPERATIVE REPORT   PREOPERATIVE DIAGNOSIS:  Stage I right breast cancer.  POSTOPERATIVE DIAGNOSIS:  Stage I right breast cancer.  PROCEDURE:  Right breast seed localized lumpectomy with negative gross margins.  SURGEON:  Marcello Moores A. Henya Aguallo, M.D.  ANESTHESIA:  0.25% Marcaine with epinephrine, approximately 57 mL used with MAC anesthesia.  EBL:  Minimal.  SPECIMENS: 1. Right breast mass with clip and seed verified by radiograph. 2. Additional margins lateral and deep.  INDICATIONS FOR PROCEDURE:  The patient is a 80 year old female with multiple medical problems including end-stage COPD, who presents with right breast cancer.  We offered her medical management versus surgical excision plus medical management.  Oncology saw her and felt that excision would be necessary given that her life expectancy may be greater than 5 years.  This was very small and I talked to her front about surgical excision versus medical management alone.  With her COPD, there is a risk of trying to put her to sleep, and therefore, we recommended wide excision under MAC with local.  We discussed potential risk of bleeding, infection, recurrence, pulmonary complications, death, pulmonary embarrassment, the need for other operative procedures. Sentinel lymph node mapping was not recommended due to her advanced age and small nature as well as ER/PR positivity of her tumor.  She agreed to proceed after meeting this consultation.  DESCRIPTION OF PROCEDURE:  The patient was met in the holding area. Questions were answered.  The right breast was examined.  The seed was localized with the Neoprobe in the holding area.  She was  taken back to the operating room and placed supine on the OR table.  After induction of MAC anesthesia, right breast and right arm were prepped and draped in sterile fashion.  She received 2 g of Ancef.  Time-out was done. Neoprobe was used to localize the seed.  Films were available to review. Curvilinear incision was made along the lateral border of the nipple- areolar complex after infiltration of local anesthesia.  Seed was extremely deep.  We had to dissect down to the chest wall and this was in the upper outer quadrant of the right breast.  I took some time, we located the small tumor, which was about 1 cm in size grossly.  I excised it with gross negative margins to include the seed and clip. This was placed in the Faxitron machine and clip and seed were verified and sent to Radiology for review, which they concurred.  Gross margin negative.  Cavity was found to be hemostatic.  It was irrigated.  It was closed in layers, the deep layer of 3-0 Vicryl and 4-0 Monocryl.  Dermabond applied.  All final counts of sponge, needle, and instruments were found to be correct at this portion of the case.  Breast binder was placed.  The patient was taken to recovery in  satisfactory condition, awake and alert.     Beren Yniguez A. Kimetha Trulson, M.D.     TAC/MEDQ  D:  12/20/2013  T:  12/20/2013  Job:  [id number]

## 2013-12-20 NOTE — Discharge Instructions (Signed)
[facility] °Office Phone Number [phone] ° °BREAST BIOPSY/ PARTIAL MASTECTOMY: POST OP INSTRUCTIONS ° °Always review your discharge instruction sheet given to you by the facility where your surgery was performed. ° °IF YOU HAVE DISABILITY OR FAMILY LEAVE FORMS, YOU MUST BRING THEM TO THE OFFICE FOR PROCESSING.  DO NOT GIVE THEM TO YOUR DOCTOR. ° °1. A prescription for pain medication may be given to you upon discharge.  Take your pain medication as prescribed, if needed.  If narcotic pain medicine is not needed, then you may take acetaminophen (Tylenol) or ibuprofen (Advil) as needed. °2. Take your usually prescribed medications unless otherwise directed °3. If you need a refill on your pain medication, please contact your pharmacy.  They will contact our office to request authorization.  Prescriptions will not be filled after 5pm or on week-ends. °4. You should eat very light the first 24 hours after surgery, such as soup, crackers, pudding, etc.  Resume your normal diet the day after surgery. °5. Most patients will experience some swelling and bruising in the breast.  Ice packs and a good support bra will help.  Swelling and bruising can take several days to resolve.  °6. It is common to experience some constipation if taking pain medication after surgery.  Increasing fluid intake and taking a stool softener will usually help or prevent this problem from occurring.  A mild laxative (Milk of Magnesia or Miralax) should be taken according to package directions if there are no bowel movements after 48 hours. °7. Unless discharge instructions indicate otherwise, you may remove your bandages 24-48 hours after surgery, and you may shower at that time.  You may have steri-strips (small skin tapes) in place directly over the incision.  These strips should be left on the skin for 7-10 days.  If your surgeon used skin glue on the incision, you may shower in 24 hours.  The glue will flake off over the  next 2-3 weeks.  Any sutures or staples will be removed at the office during your follow-up visit. °8. ACTIVITIES:  You may resume regular daily activities (gradually increasing) beginning the next day.  Wearing a good support bra or sports bra minimizes pain and swelling.  You may have sexual intercourse when it is comfortable. °a. You may drive when you no longer are taking prescription pain medication, you can comfortably wear a seatbelt, and you can safely maneuver your car and apply brakes. °b. RETURN TO WORK:  ______________________________________________________________________________________ °9. You should see your doctor in the office for a follow-up appointment approximately two weeks after your surgery.  Your doctor’s nurse will typically make your follow-up appointment when she calls you with your pathology report.  Expect your pathology report 2-3 business days after your surgery.  You may call to check if you do not hear from us after three days. °10. OTHER INSTRUCTIONS: _______________________________________________________________________________________________ _____________________________________________________________________________________________________________________________________ °_____________________________________________________________________________________________________________________________________ °_____________________________________________________________________________________________________________________________________ ° °WHEN TO CALL YOUR DOCTOR: °1. Fever over 101.0 °2. Nausea and/or vomiting. °3. Extreme swelling or bruising. °4. Continued bleeding from incision. °5. Increased pain, redness, or drainage from the incision. ° °The clinic staff is available to answer your questions during regular business hours.  Please don’t hesitate to call and ask to speak to one of the nurses for clinical concerns.  If you have a medical emergency, go to the nearest  emergency room or call 911.  A surgeon from [facility] is always on call at the hospital. ° °For further questions, please visit [email or web address]  ° ° °  Post Anesthesia Home Care Instructions ° °Activity: °Get plenty of rest for the remainder of the day. A responsible adult should stay with you for 24 hours following the procedure.  °For the next 24 hours, DO NOT: °-Drive a car °-Operate machinery °-Drink alcoholic beverages °-Take any medication unless instructed by your physician °-Make any legal decisions or sign important papers. ° °Meals: °Start with liquid foods such as gelatin or soup. Progress to regular foods as tolerated. Avoid greasy, spicy, heavy foods. If nausea and/or vomiting occur, drink only clear liquids until the nausea and/or vomiting subsides. Call your physician if vomiting continues. ° °Special Instructions/Symptoms: °Your throat may feel dry or sore from the anesthesia or the breathing tube placed in your throat during surgery. If this causes discomfort, gargle with warm salt water. The discomfort should disappear within 24 hours. ° °

## 2013-12-20 NOTE — Transfer of Care (Signed)
Immediate Anesthesia Transfer of Care Note  Patient: Courtney Cline  Procedure(s) Performed: Procedure(s): RIGHT BREAST LUMPECTOMY WITH RADIOACTIVE SEED LOCALIZATION (Right)  Patient Location: PACU  Anesthesia Type:MAC  Level of Consciousness: awake, alert  and oriented  Airway & Oxygen Therapy: Patient Spontanous Breathing and Patient connected to face mask oxygen  Post-op Assessment: Report given to PACU RN, Post -op Vital signs reviewed and stable and Patient moving all extremities  Post vital signs: Reviewed and stable  Complications: No apparent anesthesia complications

## 2013-12-20 NOTE — Interval H&P Note (Signed)
History and Physical Interval Note:  12/20/2013 7:20 AM  Courtney Cline  has presented today for surgery, with the diagnosis of Breast Cancer  The various methods of treatment have been discussed with the patient and family. After consideration of risks, benefits and other options for treatment, the patient has consented to  Procedure(s): RIGHT BREAST LUMPECTOMY WITH RADIOACTIVE SEED LOCALIZATION (Right) as a surgical intervention .  The patient's history has been reviewed, patient examined, no change in status, stable for surgery.  I have reviewed the patient's chart and labs.  Questions were answered to the patient's satisfaction.     Charl Wellen A.

## 2013-12-20 NOTE — Anesthesia Procedure Notes (Signed)
Procedure Name: MAC Date/Time: 12/20/2013 7:39 AM Performed by: Baxter Flattery Pre-anesthesia Checklist: Patient identified, Emergency Drugs available, Suction available and Patient being monitored Patient Re-evaluated:Patient Re-evaluated prior to inductionOxygen Delivery Method: Simple face mask Preoxygenation: Pre-oxygenation with 100% oxygen Intubation Type: IV induction Ventilation: Mask ventilation without difficulty Dental Injury: Teeth and Oropharynx as per pre-operative assessment

## 2013-12-23 ENCOUNTER — Encounter (HOSPITAL_BASED_OUTPATIENT_CLINIC_OR_DEPARTMENT_OTHER): Payer: Self-pay | Admitting: Surgery

## 2013-12-23 LAB — POCT HEMOGLOBIN-HEMACUE: HEMOGLOBIN: 10.9 g/dL — AB (ref 12.0–15.0)

## 2013-12-26 ENCOUNTER — Encounter: Payer: Self-pay | Admitting: *Deleted

## 2013-12-26 DIAGNOSIS — C50211 Malignant neoplasm of upper-inner quadrant of right female breast: Secondary | ICD-10-CM

## 2013-12-27 ENCOUNTER — Other Ambulatory Visit: Payer: Self-pay | Admitting: *Deleted

## 2013-12-27 DIAGNOSIS — C50912 Malignant neoplasm of unspecified site of left female breast: Secondary | ICD-10-CM

## 2013-12-31 ENCOUNTER — Other Ambulatory Visit: Payer: Self-pay | Admitting: *Deleted

## 2013-12-31 ENCOUNTER — Telehealth: Payer: Self-pay | Admitting: Hematology

## 2013-12-31 NOTE — Telephone Encounter (Signed)
s.w. pt and advised on 11.20 appt...she could not keep that day due to having another appt....she just wanted to keep DEC appt....done

## 2014-01-03 ENCOUNTER — Ambulatory Visit: Payer: Medicare Other | Admitting: Hematology

## 2014-01-27 ENCOUNTER — Telehealth: Payer: Self-pay | Admitting: Hematology

## 2014-01-27 ENCOUNTER — Encounter: Payer: Self-pay | Admitting: Hematology

## 2014-01-27 ENCOUNTER — Ambulatory Visit (HOSPITAL_BASED_OUTPATIENT_CLINIC_OR_DEPARTMENT_OTHER): Payer: Medicare Other | Admitting: Hematology

## 2014-01-27 ENCOUNTER — Other Ambulatory Visit (HOSPITAL_BASED_OUTPATIENT_CLINIC_OR_DEPARTMENT_OTHER): Payer: Medicare Other

## 2014-01-27 VITALS — BP 149/52 | HR 84 | Temp 98.0°F | Resp 19 | Ht 61.0 in | Wt 182.1 lb

## 2014-01-27 DIAGNOSIS — C50912 Malignant neoplasm of unspecified site of left female breast: Secondary | ICD-10-CM

## 2014-01-27 DIAGNOSIS — Z170 Estrogen receptor positive status [ER+]: Secondary | ICD-10-CM

## 2014-01-27 DIAGNOSIS — M899 Disorder of bone, unspecified: Secondary | ICD-10-CM

## 2014-01-27 DIAGNOSIS — J449 Chronic obstructive pulmonary disease, unspecified: Secondary | ICD-10-CM

## 2014-01-27 DIAGNOSIS — C50211 Malignant neoplasm of upper-inner quadrant of right female breast: Secondary | ICD-10-CM

## 2014-01-27 LAB — CBC WITH DIFFERENTIAL/PLATELET
BASO%: 0.2 % (ref 0.0–2.0)
Basophils Absolute: 0 10*3/uL (ref 0.0–0.1)
EOS ABS: 0.1 10*3/uL (ref 0.0–0.5)
EOS%: 1.6 % (ref 0.0–7.0)
HCT: 34.4 % — ABNORMAL LOW (ref 34.8–46.6)
HGB: 11.1 g/dL — ABNORMAL LOW (ref 11.6–15.9)
LYMPH#: 2.4 10*3/uL (ref 0.9–3.3)
LYMPH%: 27.7 % (ref 14.0–49.7)
MCH: 30.6 pg (ref 25.1–34.0)
MCHC: 32.3 g/dL (ref 31.5–36.0)
MCV: 94.8 fL (ref 79.5–101.0)
MONO#: 0.8 10*3/uL (ref 0.1–0.9)
MONO%: 9.5 % (ref 0.0–14.0)
NEUT#: 5.3 10*3/uL (ref 1.5–6.5)
NEUT%: 61 % (ref 38.4–76.8)
Platelets: 279 10*3/uL (ref 145–400)
RBC: 3.63 10*6/uL — AB (ref 3.70–5.45)
RDW: 12.9 % (ref 11.2–14.5)
WBC: 8.7 10*3/uL (ref 3.9–10.3)

## 2014-01-27 LAB — COMPREHENSIVE METABOLIC PANEL (CC13)
ALBUMIN: 3.4 g/dL — AB (ref 3.5–5.0)
ALT: 13 U/L (ref 0–55)
ANION GAP: 12 meq/L — AB (ref 3–11)
AST: 10 U/L (ref 5–34)
Alkaline Phosphatase: 62 U/L (ref 40–150)
BILIRUBIN TOTAL: 0.21 mg/dL (ref 0.20–1.20)
BUN: 33.5 mg/dL — ABNORMAL HIGH (ref 7.0–26.0)
CO2: 29 mEq/L (ref 22–29)
Calcium: 9.7 mg/dL (ref 8.4–10.4)
Chloride: 100 mEq/L (ref 98–109)
Creatinine: 1 mg/dL (ref 0.6–1.1)
EGFR: 52 mL/min/{1.73_m2} — ABNORMAL LOW (ref >90)
Glucose: 82 mg/dl (ref 70–140)
Potassium: 3.3 mEq/L — ABNORMAL LOW (ref 3.5–5.1)
SODIUM: 141 meq/L (ref 136–145)
Total Protein: 6.9 g/dL (ref 6.4–8.3)

## 2014-01-27 MED ORDER — ANASTROZOLE 1 MG PO TABS: 1.0000 mg | ORAL_TABLET | Freq: Every day | ORAL | Status: DC

## 2014-01-27 NOTE — Progress Notes (Signed)
[place]  [phone] Fax:([phone]) [phone]  Clinic Follow up Note   Patient Care Team: Antony Blackbird, MD as PCP - General (Family Medicine) Juanito Doom, MD as Consulting Physician (Pulmonary Disease) Truitt Merle, MD as Consulting Physician (Hematology) Erroll Luna, MD as Consulting Physician (General Surgery) 01/27/2014  CHIEF COMPLAIN: Follow up  SUMMARY OF ONCOLOGIC HISTORY:   Breast cancer of upper-inner quadrant of right female breast, pT1ccN0M0, stage IA   12/06/2012 Imaging Bone Density performed at [facility] T score Lumbar spine -0.1 (-0.7 in 2012), Right neck femur -1.4 (-1.2 before), Left neck femur -1.4 (-1.5 before) and Interpretation is OSTEOPENIA by WHO criteria.    10/11/2013 Mammogram Abnormal Screening mammogram showing Right Breast mass.   10/23/2013 Breast US Diagnostic mammogram and US showed an irregular hypoechoic mass at 9'0 clock position right breast 8 cm from nipple. [place] measurement 1.2x0.7x1.1 cm, no axillary adenopathy.   10/23/2013 Initial Biopsy US guided biopsy  done with clip placement.   10/23/2013 Pathology Results Invasive ductal carcinoma (IDC), DCIS, invasive cancer grade 2, ER 100%+, PR 100%+, Ki-67% 7%, HER-2/NEU by CISH No amplification, ratio of her2:cep17 1.00, average her2 copy number per cell 1.95 (HER 2 Negative tumor). Molecular Classification LUMINAL Cline.   11/01/2013 Surgery Initial surgical evaluation by Dr Marcello Moores Cornett: "Not good surgical candidate because of pulmonary status". Recommended anti-estrogen therapy.   11/18/2013 -  Neo-Adjuvant Anti-estrogen oral therapy Patient started on Arimidex today after initial evaluation (Dr Lona Kettle). Also discussed with Dr Lake Bells and Dr Brantley Stage to reconsider surgery after optimization of her pulmonary status, perhaps with Local anesthesia.   12/20/2013 Surgery right breast lumpectomy without SLN biopsy, negative margins    12/20/2013 Pathologic Stage pT1cNxMx, G2, LVI (-),  tumor measures 1.2cm. Surgical margins are negative.     CURRENT THERAPY: Anastrazole 1mg  daily, started on 11/18/2013  INTERVAL HISTORY: She returns for follow up.  She was previously seen by Dr. Arlys John, who has left the practice.  She underwent right breast lumpectomy under local anesthesia, without sentinel lymph node biopsy. Surgical margin was negative. She tolerated the surgery well without complications.  She is recovering well from surgery. She is on oxygen 3l continuoysly. She has good appetite and eats well. She has some cough lately, she is still on antibiotics and just finished Cline short course of prednisone. She is able to take care of her self at home.  REVIEW OF SYSTEMS:   Constitutional: Denies fevers, chills or abnormal weight loss Eyes: Denies blurriness of vision Ears, nose, mouth, throat, and face: Denies mucositis or sore throat Respiratory: (+) cough and dyspnea, no wheezes Cardiovascular: Denies palpitation, chest discomfort or lower extremity swelling Gastrointestinal:  Denies nausea, heartburn or change in bowel habits Skin: Denies abnormal skin rashes Lymphatics: Denies new lymphadenopathy or easy bruising Neurological:Denies numbness, tingling or new weaknesses Behavioral/Psych: Mood is stable, no new changes  All other systems were reviewed with the patient and are negative.  MEDICAL HISTORY:  Past Medical History  Diagnosis Date  . Hyperlipidemia   . Hypertension   . GERD (gastroesophageal reflux disease)   . COPD (chronic obstructive pulmonary disease)   . Osteopenia   . Breast calcification seen on mammogram     Right breast  . Hyperglycemia   . Peripheral edema   . Vitamin D deficiency   . Allergic rhinitis   . Low back pain   . Wears glasses     reading  . Wears dentures     top  .  On home oxygen therapy     all the time  . Shortness of breath   . Arthritis     SURGICAL HISTORY: Past Surgical History  Procedure Laterality Date  .  Cataract extraction      both eyes  . Breast lumpectomy with radioactive seed localization Right 12/20/2013    Procedure: RIGHT BREAST LUMPECTOMY WITH RADIOACTIVE SEED LOCALIZATION;  Surgeon: Erroll Luna, MD;  Location: [place];  Service: General;  Laterality: Right;    I have reviewed the social history and family history with the patient and they are unchanged from previous note.  ALLERGIES:  has No Known Allergies.  MEDICATIONS:  Current Outpatient Prescriptions  Medication Sig Dispense Refill  . acetaminophen (TYLENOL) 325 MG tablet Take 650 mg by mouth every 6 (six) hours as needed.    Marland Kitchen albuterol (PROVENTIL HFA;VENTOLIN HFA) 108 (90 BASE) MCG/ACT inhaler Inhale 2 puffs into the lungs every 6 (six) hours as needed for wheezing or shortness of breath.    . anastrozole (ARIMIDEX) 1 MG tablet Take 1 tablet (1 mg total) by mouth daily. 90 tablet 3  . aspirin 81 MG tablet Take 81 mg by mouth daily.    . Cholecalciferol (VITAMIN D) 2000 UNITS CAPS Take 1 capsule by mouth daily.    . Cyanocobalamin (VITAMIN B 12 PO) Take 1 tablet by mouth daily.    Marland Kitchen doxycycline (DORYX) 100 MG DR capsule Take 100 mg by mouth 2 (two) times daily. Will be finished 12/16    . hydrochlorothiazide (MICROZIDE) 12.5 MG capsule Take 12.5 mg by mouth daily.    Marland Kitchen lisinopril (PRINIVIL,ZESTRIL) 40 MG tablet Take 40 mg by mouth daily.    Marland Kitchen lovastatin (MEVACOR) 40 MG tablet Take 40 mg by mouth at bedtime.    Marland Kitchen omeprazole (PRILOSEC) 20 MG capsule Take 20 mg by mouth daily.    . Tiotropium Bromide Monohydrate (SPIRIVA RESPIMAT) 2.5 MCG/ACT AERS Inhale 2 puffs into the lungs daily. 4 g 4   No current facility-administered medications for this visit.    PHYSICAL EXAMINATION: ECOG PERFORMANCE STATUS: 2 - Symptomatic, <50% confined to bed  Filed Vitals:   01/27/14 1003  BP: 149/52  Pulse: 84  Temp: 98 F (36.7 C)  Resp: 19   Filed Weights   01/27/14 1003  Weight: 182 lb 1.6 oz (82.6 kg)     GENERAL:alert, no distress and comfortable SKIN: skin color, texture, turgor are normal, no rashes or significant lesions EYES: normal, Conjunctiva are pink and non-injected, sclera clear OROPHARYNX:no exudate, no erythema and lips, buccal mucosa, and tongue normal  NECK: supple, thyroid normal size, non-tender, without nodularity LYMPH:  no palpable lymphadenopathy in the cervical, axillary or inguinal LUNGS: clear to auscultation and percussion with normal breathing effort HEART: regular rate & rhythm and no murmurs and no lower extremity edema ABDOMEN:abdomen soft, non-tender and normal bowel sounds Musculoskeletal:no cyanosis of digits and no clubbing  NEURO: alert & oriented x 3 with fluent speech, no focal motor/sensory deficits  LABORATORY DATA:  I have reviewed the data as listed CBC Latest Ref Rng 01/27/2014 12/20/2013 12/18/2013  WBC 3.9 - 10.3 10e3/uL 8.7 - 9.7  Hemoglobin 11.6 - 15.9 g/dL 11.1(L) 10.9(L) 11.1(L)  Hematocrit 34.8 - 46.6 % 34.4(L) - 35.5(L)  Platelets 145 - 400 10e3/uL 279 - 310     CMP Latest Ref Rng 01/27/2014 12/18/2013  Glucose 70 - 140 mg/dl 82 97  BUN 7.0 - 26.0 mg/dL 33.5(H) 28(H)  Creatinine 0.6 -  1.1 mg/dL 1.0 0.95  Sodium 136 - 145 mEq/L 141 140  Potassium 3.5 - 5.1 mEq/L 3.3(L) 3.8  Chloride 96 - 112 mEq/L - 99  CO2 22 - 29 mEq/L 29 27  Calcium 8.4 - 10.4 mg/dL 9.7 9.6  Total Protein 6.4 - 8.3 g/dL 6.9 7.5  Total Bilirubin 0.20 - 1.20 mg/dL 0.21 <0.2(L)  Alkaline Phos 40 - 150 U/L 62 60  AST 5 - 34 U/L 10 13  ALT 0 - 55 U/L 13 11   PATHOLOGY REPORT  12/20/2013 Diagnosis 1. Breast, lumpectomy, Right - INVASIVE DUCTAL CARCINOMA, GRADE 2, SPANNING 1.2 CM. - DUCTAL CARCINOMA IN SITU. - BIOPSY SITE. - FINAL RESECTION MARGINS ARE NEGATIVE. - SEE ONCOLOGY TABLE. 2. Breast, excision, Right additional deep margin - BENIGN FIBROADIPOSE TISSUE. - NO MALIGNANCY IDENTIFIED. 3. Breast, excision, Right additional lateral margin - BENIGN  BREAST TISSUE WITH FIBROCYSTIC CHANGE. - NO MALIGNANCY IDENTIFIED. 1 of 3 FINAL for Courtney Cline, Courtney Cline [phone]) Microscopic Comment 1. BREAST, INVASIVE TUMOR, WITH LYMPH NODES PRESENT Specimen, including laterality and lymph node sampling (sentinel, non-sentinel): Right breast lump. Procedure: Right breast lumpectomy. Histologic type: Invasive ductal carcinoma. Grade: 2 Tubule formation: 3 Nuclear pleomorphism: 2 Mitotic:1 Tumor size (gross measurement): 1.2 cm Margins: Invasive, distance to closest margin: Present at cauterized deep margin of original specimen. Additional deep margin (part #2) is negative. In-situ, distance to closest margin: Present at cauterized deep margin of original specimen. Additional deep margin (part #2) is negative. If margin positive, focally or broadly: Original is focal. Final is negative. Lymphovascular invasion: Absent. Ductal carcinoma in situ: Present. Grade: II Extensive intraductal component: No. Lobular neoplasia: Absent. Tumor focality: Unifocal. Treatment effect: N/Cline. Extent of tumor: Confined to breast parenchyma. Lymph nodes: Not sampled. Examined: 0 Sentinel 0 Non-sentinel 0 Total Lymph nodes with metastasis: 0 Isolated tumor cells (< 0.2 mm): 0 Micrometastasis: (> 0.2 mm and < 2.0 mm): 0 Macrometastasis: (> 2.0 mm): 0 Extracapsular extension: 0 Breast prognostic profile: Performed on [id number] (results below). Her 2 neu will be repeated on current specimen. Estrogen receptor: Positive, strong intensity. Progesterone receptor: Positive, strong intensity. Her 2 neu: Not amplified. Ki-67: 7%    RADIOGRAPHIC STUDIES: I have personally reviewed the radiological images as listed and agreed with the findings in the report. No results found.   ASSESSMENT & PLAN:  80 year old female with past medical history significant for COPD, on continuous 3 L/m oxygen, who was diagnosed with T1 cN0 M0, stage I Cline right breast invasive  ductal carcinoma, status post lumpectomy.  1. Right breast invasive ductal carcinoma, T1 cN0 M0, stage I Cline, G2, ER 100% positive, PR 100% positive, HER-2 negative. -She initially sought was not Cline surgical candidate, but didn't tolerate lumpectomy with local anesthesia well. Her surgical margin was negative. -I recommend her continue adjuvant aromatase inhibitor anastrozole for total 5 years, to reduce the risks of cancer recurrence in the future. -Giving her advanced age and severe comorbidity, and early stage breast cancer, I do not recommend adjuvant chemotherapy. Giving the strong ER/PR positive, low Ki-67, negative HER-2/neu, I think that her recurrence score would be low even we did Oncotype. -Her case was presented in our breast tumor board before and adjuvant RT was not recommended. -Potential side effects of anastrozole were reviewed with patient and her husband again. She has been tolerating well.   2. Bone health:  -She had Cline bone density scan 1 year ago which showed osteopenia, we'll repeat next year. -Anastrozole can cause osteoporosis  or worsening osteopenia. -I encouraged her to continue taking calcium supplementation and vitamin D.  3. Severe COPD, on home oxygen -She is followed with her pulmonologist Dr. Lake Bells.  Plan #1 continue anastrozole, for Cline total of 5 years #2 return to clinic in 3 months with lab CBC and CMP.  All questions were answered. The patient knows to call the clinic with any problems, questions or concerns. No barriers to learning was detected. I spent 20 minutes counseling the patient face to face. The total time spent in the appointment was 30 minutes and more than 50% was on counseling and review of test results     Truitt Merle, MD 01/27/2014 11:02 AM

## 2014-01-27 NOTE — Telephone Encounter (Signed)
Gave avs & cal for March. °

## 2014-03-20 ENCOUNTER — Ambulatory Visit (INDEPENDENT_AMBULATORY_CARE_PROVIDER_SITE_OTHER): Payer: Medicare Other | Admitting: Adult Health

## 2014-03-20 ENCOUNTER — Other Ambulatory Visit (INDEPENDENT_AMBULATORY_CARE_PROVIDER_SITE_OTHER): Payer: Medicare Other

## 2014-03-20 ENCOUNTER — Encounter: Payer: Self-pay | Admitting: Adult Health

## 2014-03-20 VITALS — BP 146/70 | HR 99 | Temp 97.8°F | Ht 61.0 in | Wt 190.4 lb

## 2014-03-20 DIAGNOSIS — J9611 Chronic respiratory failure with hypoxia: Secondary | ICD-10-CM

## 2014-03-20 DIAGNOSIS — J438 Other emphysema: Secondary | ICD-10-CM

## 2014-03-20 DIAGNOSIS — J449 Chronic obstructive pulmonary disease, unspecified: Secondary | ICD-10-CM

## 2014-03-20 LAB — BASIC METABOLIC PANEL
BUN: 32 mg/dL — ABNORMAL HIGH (ref 6–23)
CALCIUM: 9.4 mg/dL (ref 8.4–10.5)
CO2: 32 meq/L (ref 19–32)
Chloride: 103 mEq/L (ref 96–112)
Creatinine, Ser: 1.04 mg/dL (ref 0.40–1.20)
GFR: 54.39 mL/min — AB (ref >60.00)
Glucose, Bld: 105 mg/dL — ABNORMAL HIGH (ref 70–99)
POTASSIUM: 4.1 meq/L (ref 3.5–5.1)
Sodium: 140 mEq/L (ref 135–145)

## 2014-03-20 LAB — BRAIN NATRIURETIC PEPTIDE: Pro B Natriuretic peptide (BNP): 35 pg/mL (ref 0.0–100.0)

## 2014-03-20 MED ORDER — TIOTROPIUM BROMIDE-OLODATEROL 2.5-2.5 MCG/ACT IN AERS: 2.0000 | INHALATION_SPRAY | Freq: Every day | RESPIRATORY_TRACT | Status: DC

## 2014-03-20 MED ORDER — AZITHROMYCIN 250 MG PO TABS: ORAL_TABLET | ORAL | Status: AC

## 2014-03-20 MED ORDER — FUROSEMIDE 40 MG PO TABS: 40.0000 mg | ORAL_TABLET | Freq: Every day | ORAL | Status: DC

## 2014-03-20 NOTE — Assessment & Plan Note (Signed)
Severe COPD with recurrent exacerbation  Check bnp ? Diastolic dysfunction .  Suspect ACE is contributing to recurrent cough  Stiolto trial .  Plan  Zpack take as directed.  Hold Spiriva  Begin Stiolto 2 puff daily  Discuss with family doctor to change your lisinopril as it may cause your cough to be worse.  Hold Hydrochlorothiazide for 3 days , Begin Lasix 40mg  daily for 3 days ,then go back to Hydrochlorothiazide.  Leg elevated, low salt diet.  Labs today  Follow up Dr. Lake Bells in 4 weeks and As needed   Please contact office for sooner follow up if symptoms do not improve or worsen or seek emergency care

## 2014-03-20 NOTE — Patient Instructions (Addendum)
Zpack take as directed.  Hold Spiriva  Begin Stiolto 2 puff daily  Discuss with family doctor to change your lisinopril as it may cause your cough to be worse.  Hold Hydrochlorothiazide for 3 days , Begin Lasix 40mg  daily for 3 days ,then go back to Hydrochlorothiazide.  Leg elevated, low salt diet.  Labs today  Follow up Dr. Lake Bells in 4 weeks and As needed   Please contact office for sooner follow up if symptoms do not improve or worsen or seek emergency care

## 2014-03-20 NOTE — Progress Notes (Signed)
   Subjective:    Patient ID: Courtney Cline, female    DOB: 1933-08-07, 81 y.o.   MRN: [id number]  HPI Synopsis: GOLD Grade D COPD 2015 FEV1 33% pred, on 3L O2 continuously   03/20/2014 Acute OV  Presents for an acute office visit , complains Of note on ACE inhibitor .   Pt c/o SOB at any time x 2 weeks, cough , clear and yellow mucus, drainage, post nasal drip .  No otc used.  No hemopytiss , chest pain, orthopnea,  or fever.  Ankles more swollen last few weeks.  Wt up 10 lbs over last 6 months.  Had breast lumpectomy in Nov 2015. Now on Arimidex.  Remains on Spiriva  Daily .  Remains on ACE  Inhibitor.         Review of Systems Constitutional:   No  weight loss, night sweats,  Fevers, chills,  +fatigue, or  lassitude.  HEENT:   No headaches,  Difficulty swallowing,  Tooth/dental problems, or  Sore throat,                No sneezing, itching, ear ache, + nasal congestion, post nasal drip,   CV:  No chest pain,  Orthopnea, PND, s  anasarca, dizziness, palpitations, syncope.   GI  No heartburn, indigestion, abdominal pain, nausea, vomiting, diarrhea, change in bowel habits, loss of appetite, bloody stools.   Resp:    No chest wall deformity  Skin: no rash or lesions.  GU: no dysuria, change in color of urine, no urgency or frequency.  No flank pain, no hematuria   MS:  No joint pain or swelling.  No decreased range of motion.  No back pain.  Psych:  No change in mood or affect. No depression or anxiety.  No memory loss.         Objective:   Physical Exam  GEN: A/Ox3; pleasant , elderly , chronically ill appearing   HEENT:  [place]/AT,  EACs-clear, TMs-wnl, NOSE-clear, THROAT-clear, no lesions, no postnasal drip or exudate noted.   NECK:  Supple w/ fair ROM; no JVD; normal carotid impulses w/o bruits; no thyromegaly or nodules palpated; no lymphadenopathy.  RESP Decreased BS in bases , no wheezing  no accessory muscle use, no dullness to percussion  CARD:   RRR, no m/r/g  , 1+ peripheral edema, pulses intact, no cyanosis or clubbing.  GI:   Soft & nt; nml bowel sounds; no organomegaly or masses detected.  Musco: Warm bil, no deformities or joint swelling noted.   Neuro: alert, no focal deficits noted.    Skin: Warm, no lesions or rashes        Assessment & Plan:

## 2014-03-20 NOTE — Assessment & Plan Note (Signed)
Compensated on present regimen.   

## 2014-03-27 NOTE — Progress Notes (Signed)
I agree with above assessment and plan.

## 2014-04-15 ENCOUNTER — Telehealth (HOSPITAL_COMMUNITY): Payer: Self-pay | Admitting: Cardiovascular Disease

## 2014-04-15 ENCOUNTER — Telehealth: Payer: Self-pay | Admitting: Cardiovascular Disease

## 2014-04-15 NOTE — Telephone Encounter (Signed)
Received records from [facility] for appointment with Dr Gwenlyn Found on 04/24/14.  Records given to [facility] (medical records) for Dr Kennon Holter schedule on 04/23/13.  lp

## 2014-04-16 NOTE — Telephone Encounter (Signed)
CLOSE ENCOUNTER °

## 2014-04-22 ENCOUNTER — Encounter: Payer: Self-pay | Admitting: Pulmonary Disease

## 2014-04-22 ENCOUNTER — Ambulatory Visit (INDEPENDENT_AMBULATORY_CARE_PROVIDER_SITE_OTHER): Payer: Medicare Other | Admitting: Pulmonary Disease

## 2014-04-22 VITALS — BP 148/62 | HR 83 | Ht 61.0 in

## 2014-04-22 DIAGNOSIS — J9611 Chronic respiratory failure with hypoxia: Secondary | ICD-10-CM

## 2014-04-22 DIAGNOSIS — J432 Centrilobular emphysema: Secondary | ICD-10-CM

## 2014-04-22 MED ORDER — TIOTROPIUM BROMIDE-OLODATEROL 2.5-2.5 MCG/ACT IN AERS: 2.0000 | INHALATION_SPRAY | Freq: Every day | RESPIRATORY_TRACT | Status: DC

## 2014-04-22 NOTE — Assessment & Plan Note (Signed)
Continue 3 L of oxygen continuously 

## 2014-04-22 NOTE — Patient Instructions (Signed)
Let us know if the Stiolto is too expensive.  If so then we will change you back to Spiriva and Formoterol. Keep using your oxygen as you are doing We will see you back in 3 months or sooner if needed

## 2014-04-22 NOTE — Assessment & Plan Note (Signed)
Courtney Cline has recovered from her recent exacerbation of COPD and she is doing quite well. She does have severe COPD and is at high risk for recurrent complications. This time she appears to be doing well. She did better with the addition of the long acting beta agonist like for her to continue on that.  Plan: -Continue Stiolto, but if that medication is too expensive than we can prescribe formoterol and Spiriva -Continue exercise as tolerated

## 2014-04-22 NOTE — Progress Notes (Signed)
Subjective:    Patient ID: Courtney Cline, female    DOB: December 18, 1933, 81 y.o.   MRN: [id number]  Synopsis: GOLD Grade D COPD 2015 FEV1 33% pred, on 2L O2 continuously  HPI  Chief Complaint  Patient presents with  . Follow-up    doing well.  sinus pressure, sinus drainage.     Shamelle has been feeling better since seeing Tammy.  She has been taking the Stiolto and her breathing is much better and she is not having to use her rescue inhaler as much.  She is not coughing very much right now.  She had a mild sunburn this morning after going out in the sun. In general she feels much better since the last visit.  Past Medical History  Diagnosis Date  . Hyperlipidemia   . Hypertension   . GERD (gastroesophageal reflux disease)   . COPD (chronic obstructive pulmonary disease)   . Osteopenia   . Breast calcification seen on mammogram     Right breast  . Hyperglycemia   . Peripheral edema   . Vitamin D deficiency   . Allergic rhinitis   . Low back pain   . Wears glasses     reading  . Wears dentures     top  . On home oxygen therapy     all the time  . Shortness of breath   . Arthritis      Review of Systems     Objective:   Physical Exam Filed Vitals:   04/22/14 1551  BP: 148/62  Pulse: 83  Height: 5\' 1"  (1.549 m)  SpO2: 100%   2L O2  Gen:, no acute distress HEENT: NCAT, EOMi, OP clear, PULM: Improved air movement, no wheezing CV: RRR, no mgr, no JVD AB: BS+, soft, nontender,  Ext: warm, trace edema, no clubbing, no cyanosis Derm: no rash or skin breakdown Neuro: A&Ox4, MAEW        Assessment & Plan:   COPD (chronic obstructive pulmonary disease) Dulce has recovered from her recent exacerbation of COPD and she is doing quite well. She does have severe COPD and is at high risk for recurrent complications. This time she appears to be doing well. She did better with the addition of the long acting beta agonist like for her to continue on  that.  Plan: -Continue Stiolto, but if that medication is too expensive than we can prescribe formoterol and Spiriva -Continue exercise as tolerated   Chronic hypoxemic respiratory failure Continue 3 L of oxygen continuously     Updated Medication List Outpatient Encounter Prescriptions as of 04/22/2014  Medication Sig  . acetaminophen (TYLENOL) 325 MG tablet Take 650 mg by mouth every 6 (six) hours as needed.  Marland Kitchen albuterol (PROVENTIL HFA;VENTOLIN HFA) 108 (90 BASE) MCG/ACT inhaler Inhale 2 puffs into the lungs every 6 (six) hours as needed for wheezing or shortness of breath.  . anastrozole (ARIMIDEX) 1 MG tablet Take 1 tablet (1 mg total) by mouth daily.  Marland Kitchen aspirin 81 MG tablet Take 81 mg by mouth daily.  . Cholecalciferol (VITAMIN D) 2000 UNITS CAPS Take 1 capsule by mouth daily.  . Cyanocobalamin (VITAMIN B 12 PO) Take 1 tablet by mouth daily.  . furosemide (LASIX) 40 MG tablet Take 1 tablet (40 mg total) by mouth daily.  . hydrochlorothiazide (MICROZIDE) 12.5 MG capsule Take 12.5 mg by mouth daily.  Marland Kitchen lisinopril (PRINIVIL,ZESTRIL) 40 MG tablet Take 40 mg by mouth daily.  Marland Kitchen lovastatin (MEVACOR) 40 MG  tablet Take 40 mg by mouth at bedtime.  Marland Kitchen omeprazole (PRILOSEC) 20 MG capsule Take 20 mg by mouth daily.  . Tiotropium Bromide-Olodaterol 2.5-2.5 MCG/ACT AERS Inhale 2 puffs into the lungs daily.  . [DISCONTINUED] Tiotropium Bromide-Olodaterol 2.5-2.5 MCG/ACT AERS Inhale 2 puffs into the lungs daily.  . [DISCONTINUED] doxycycline (DORYX) 100 MG DR capsule Take 100 mg by mouth 2 (two) times daily. Will be finished 12/16

## 2014-04-24 ENCOUNTER — Ambulatory Visit (INDEPENDENT_AMBULATORY_CARE_PROVIDER_SITE_OTHER): Payer: Medicare Other | Admitting: Cardiovascular Disease

## 2014-04-24 ENCOUNTER — Encounter: Payer: Self-pay | Admitting: Cardiovascular Disease

## 2014-04-24 VITALS — BP 132/60 | HR 86 | Ht 61.0 in | Wt 189.6 lb

## 2014-04-24 DIAGNOSIS — R600 Localized edema: Secondary | ICD-10-CM | POA: Insufficient documentation

## 2014-04-24 DIAGNOSIS — I708 Atherosclerosis of other arteries: Secondary | ICD-10-CM

## 2014-04-24 DIAGNOSIS — I771 Stricture of artery: Secondary | ICD-10-CM | POA: Insufficient documentation

## 2014-04-24 DIAGNOSIS — E785 Hyperlipidemia, unspecified: Secondary | ICD-10-CM

## 2014-04-24 DIAGNOSIS — I10 Essential (primary) hypertension: Secondary | ICD-10-CM | POA: Insufficient documentation

## 2014-04-24 MED ORDER — FUROSEMIDE 40 MG PO TABS: 40.0000 mg | ORAL_TABLET | Freq: Every day | ORAL | Status: DC

## 2014-04-24 NOTE — Assessment & Plan Note (Signed)
Patient was referred back for newly recognized bilateral lower extremity edema. This improved with the addition of an oral diuretic. She does admit to dietary indiscretion with regards to salt. She is on chronic home O2 because of COPD and has not noticed increasing shortness of breath. I am going to get a 2-D echo for LV function.

## 2014-04-24 NOTE — Assessment & Plan Note (Signed)
By duplex ultrasound and differential blood pressures although asymptomatic

## 2014-04-24 NOTE — Assessment & Plan Note (Signed)
On lovastatin 40 mg a day followed by her PCP

## 2014-04-24 NOTE — Assessment & Plan Note (Signed)
History of hypertension with blood pressure measured at 132/60 in the right arm. She is on hydrochlorothiazide and lisinopril. Continue current meds at current dosing

## 2014-04-24 NOTE — Patient Instructions (Addendum)
Echocardiogram. Echocardiography is a painless test that uses sound waves to create images of your heart. It provides your doctor with information about the size and shape of your heart and how well your heart's chambers and valves are working. This procedure takes approximately one hour. There are no restrictions for this procedure.  Your physician recommends that you schedule a follow-up appointment in: 1 month with Dr. Daiva Eves taking Lasix 40 mg, once daily

## 2014-04-24 NOTE — Progress Notes (Signed)
04/24/2014 Courtney Cline   08-28-1933  [id number]  Primary Physician Antony Blackbird, MD Primary Cardiologist: Lorretta Harp MD Renae Gloss   HPI:  Courtney Cline is a 81 year old married Caucasian female mother of 2, grandmother 4 grandchildren who is accompanied by her husband today. She was initially referred to me by Dr. Antony Blackbird for peripheral vascular evaluation because of presumed left subclavian artery stenosis and/or steel. Her cardiac risk factor profile is notable for remote tobacco abuse having quit 22 years ago, treated hypertension, and hyperlipidemia. She has never had a heart attack or stroke. She was a symptomatically with regards to left upper extremity claudication or dizziness and therefore continue conservative treatment of her subclavian artery stenosis was recommended at that time. She has noticed increasing bilateral lower extremity edema over the last one month which has improved somewhat with the addition of an oral diuretic. She does admit to dietary indiscretion. She is on chronic home O2 for her COPD and has not noticed any increasing shortness of breath.   Current Outpatient Prescriptions  Medication Sig Dispense Refill  . acetaminophen (TYLENOL) 325 MG tablet Take 650 mg by mouth every 6 (six) hours as needed.    Marland Kitchen albuterol (PROVENTIL HFA;VENTOLIN HFA) 108 (90 BASE) MCG/ACT inhaler Inhale 2 puffs into the lungs every 6 (six) hours as needed for wheezing or shortness of breath.    . anastrozole (ARIMIDEX) 1 MG tablet Take 1 tablet (1 mg total) by mouth daily. 90 tablet 3  . aspirin 81 MG tablet Take 81 mg by mouth daily.    . Cholecalciferol (VITAMIN D) 2000 UNITS CAPS Take 1 capsule by mouth daily.    . Cyanocobalamin (VITAMIN B 12 PO) Take 1 tablet by mouth daily.    . furosemide (LASIX) 40 MG tablet Take 1 tablet (40 mg total) by mouth daily. 3 tablet 0  . hydrochlorothiazide (MICROZIDE) 12.5 MG capsule Take 12.5 mg by mouth daily.    Marland Kitchen  lisinopril (PRINIVIL,ZESTRIL) 40 MG tablet Take 40 mg by mouth daily.    Marland Kitchen lovastatin (MEVACOR) 40 MG tablet Take 40 mg by mouth at bedtime.    Marland Kitchen omeprazole (PRILOSEC) 20 MG capsule Take 20 mg by mouth daily.    . potassium chloride (K-DUR) 10 MEQ tablet Take 10 mEq by mouth daily.    . Tiotropium Bromide-Olodaterol 2.5-2.5 MCG/ACT AERS Inhale 2 puffs into the lungs daily. 1 Inhaler 5   No current facility-administered medications for this visit.    No Known Allergies  History   Social History  . Marital Status: Married    Spouse Name: [name]  . Number of Children: [name]  . Years of Education: [name]   Occupational History  . retired     Chartered certified accountant   Social History Main Topics  . Smoking status: Former Smoker -- 1.00 packs/day for 40 years    Types: Cigarettes    Quit date: 02/14/1994  . Smokeless tobacco: Never Used  . Alcohol Use: No  . Drug Use: No  . Sexual Activity: Not on file   Other Topics Concern  . Not on file   Social History Narrative     Review of Systems: General: negative for chills, fever, night sweats or weight changes.  Cardiovascular: negative for chest pain, dyspnea on exertion, edema, orthopnea, palpitations, paroxysmal nocturnal dyspnea or shortness of breath Dermatological: negative for rash Respiratory: negative for cough or wheezing Urologic: negative for hematuria Abdominal: negative for nausea, vomiting, diarrhea, bright red  blood per rectum, melena, or hematemesis Neurologic: negative for visual changes, syncope, or dizziness All other systems reviewed and are otherwise negative except as noted above.    Blood pressure 132/60, pulse 86, height 5\' 1"  (1.549 m), weight 189 lb 9.6 oz (86.002 kg).  General appearance: alert and no distress Neck: no adenopathy, no carotid bruit, no JVD, supple, symmetrical, trachea midline and thyroid not enlarged, symmetric, no tenderness/mass/nodules Lungs: clear to auscultation bilaterally Heart: regular rate  and rhythm, S1, S2 normal, no murmur, click, rub or gallop Extremities: 1+ bilateral lower extremity edema  EKG not performed today  ASSESSMENT AND PLAN:   Subclavian artery stenosis, left By duplex ultrasound and differential blood pressures although asymptomatic   Hyperlipidemia On lovastatin 40 mg a day followed by her PCP   Essential hypertension History of hypertension with blood pressure measured at 132/60 in the right arm. She is on hydrochlorothiazide and lisinopril. Continue current meds at current dosing   Bilateral lower extremity edema Patient was referred back for newly recognized bilateral lower extremity edema. This improved with the addition of an oral diuretic. She does admit to dietary indiscretion with regards to salt. She is on chronic home O2 because of COPD and has not noticed increasing shortness of breath. I am going to get a 2-D echo for LV function.       Lorretta Harp MD FACP,FACC,FAHA, [facility] 04/24/2014 4:44 PM

## 2014-04-25 ENCOUNTER — Other Ambulatory Visit: Payer: Self-pay | Admitting: *Deleted

## 2014-04-25 DIAGNOSIS — C50211 Malignant neoplasm of upper-inner quadrant of right female breast: Secondary | ICD-10-CM

## 2014-04-28 ENCOUNTER — Ambulatory Visit (HOSPITAL_BASED_OUTPATIENT_CLINIC_OR_DEPARTMENT_OTHER): Payer: Medicare Other | Admitting: Hematology

## 2014-04-28 ENCOUNTER — Other Ambulatory Visit (HOSPITAL_BASED_OUTPATIENT_CLINIC_OR_DEPARTMENT_OTHER): Payer: Medicare Other

## 2014-04-28 ENCOUNTER — Telehealth: Payer: Self-pay | Admitting: Hematology

## 2014-04-28 ENCOUNTER — Encounter: Payer: Self-pay | Admitting: Hematology

## 2014-04-28 VITALS — BP 152/57 | HR 95 | Temp 97.7°F | Resp 18 | Ht 61.0 in | Wt 189.5 lb

## 2014-04-28 DIAGNOSIS — C50811 Malignant neoplasm of overlapping sites of right female breast: Secondary | ICD-10-CM

## 2014-04-28 DIAGNOSIS — C50211 Malignant neoplasm of upper-inner quadrant of right female breast: Secondary | ICD-10-CM

## 2014-04-28 DIAGNOSIS — D649 Anemia, unspecified: Secondary | ICD-10-CM

## 2014-04-28 DIAGNOSIS — Z170 Estrogen receptor positive status [ER+]: Secondary | ICD-10-CM

## 2014-04-28 LAB — COMPREHENSIVE METABOLIC PANEL (CC13)
ALBUMIN: 3.6 g/dL (ref 3.5–5.0)
ALT: 11 U/L (ref 0–55)
AST: 12 U/L (ref 5–34)
Alkaline Phosphatase: 60 U/L (ref 40–150)
Anion Gap: 10 mEq/L (ref 3–11)
BILIRUBIN TOTAL: 0.28 mg/dL (ref 0.20–1.20)
BUN: 39.3 mg/dL — ABNORMAL HIGH (ref 7.0–26.0)
CHLORIDE: 104 meq/L (ref 98–109)
CO2: 29 mEq/L (ref 22–29)
Calcium: 9.5 mg/dL (ref 8.4–10.4)
Creatinine: 1.2 mg/dL — ABNORMAL HIGH (ref 0.6–1.1)
EGFR: 42 mL/min/{1.73_m2} — AB (ref >90)
Glucose: 114 mg/dl (ref 70–140)
Potassium: 4.2 mEq/L (ref 3.5–5.1)
SODIUM: 143 meq/L (ref 136–145)
TOTAL PROTEIN: 6.9 g/dL (ref 6.4–8.3)

## 2014-04-28 LAB — CBC WITH DIFFERENTIAL/PLATELET
BASO%: 0.2 % (ref 0.0–2.0)
Basophils Absolute: 0 10*3/uL (ref 0.0–0.1)
EOS%: 3 % (ref 0.0–7.0)
Eosinophils Absolute: 0.2 10*3/uL (ref 0.0–0.5)
HCT: 32.4 % — ABNORMAL LOW (ref 34.8–46.6)
HGB: 10.1 g/dL — ABNORMAL LOW (ref 11.6–15.9)
LYMPH#: 1.3 10*3/uL (ref 0.9–3.3)
LYMPH%: 23.5 % (ref 14.0–49.7)
MCH: 30 pg (ref 25.1–34.0)
MCHC: 31.2 g/dL — AB (ref 31.5–36.0)
MCV: 96.1 fL (ref 79.5–101.0)
MONO#: 0.5 10*3/uL (ref 0.1–0.9)
MONO%: 8.5 % (ref 0.0–14.0)
NEUT#: 3.5 10*3/uL (ref 1.5–6.5)
NEUT%: 64.8 % (ref 38.4–76.8)
Platelets: 231 10*3/uL (ref 145–400)
RBC: 3.37 10*6/uL — AB (ref 3.70–5.45)
RDW: 13.6 % (ref 11.2–14.5)
WBC: 5.3 10*3/uL (ref 3.9–10.3)

## 2014-04-28 NOTE — Telephone Encounter (Signed)
gv adn prinetd appt sched anda vs for pt for June

## 2014-04-28 NOTE — Progress Notes (Signed)
[place]  Telephone:(336) [phone] Fax:([phone]) [phone]  Clinic Follow up Note   Patient Care Team: Antony Blackbird, MD as PCP - General (Family Medicine) Juanito Doom, MD as Consulting Physician (Pulmonary Disease) Truitt Merle, MD as Consulting Physician (Hematology) Erroll Luna, MD as Consulting Physician (General Surgery) 04/28/2014  CHIEF COMPLAIN: Follow up  SUMMARY OF ONCOLOGIC HISTORY:   Breast cancer of upper-inner quadrant of right female breast, pT1ccN0M0, stage IA   12/06/2012 Imaging Bone Density performed at [facility] physicians T score Lumbar spine -0.1 (-0.7 in 2012), Right neck femur -1.4 (-1.2 before), Left neck femur -1.4 (-1.5 before) and Interpretation is OSTEOPENIA by WHO criteria.    10/11/2013 Mammogram Abnormal Screening mammogram showing Right Breast mass.   10/23/2013 Breast US Diagnostic mammogram and US showed an irregular hypoechoic mass at 9'0 clock position right breast 8 cm from nipple. [place] measurement 1.2x0.7x1.1 cm, no axillary adenopathy.   10/23/2013 Initial Biopsy US guided biopsy  done with clip placement.   10/23/2013 Pathology Results Invasive ductal carcinoma (IDC), DCIS, invasive cancer grade 2, ER 100%+, PR 100%+, Ki-67% 7%, HER-2/NEU by CISH No amplification, ratio of her2:cep17 1.00, average her2 copy number per cell 1.95 (HER 2 Negative tumor). Molecular Classification LUMINAL A.   11/01/2013 Surgery Initial surgical evaluation by Dr Marcello Moores Cornett: "Not good surgical candidate because of pulmonary status". Recommended anti-estrogen therapy.   11/18/2013 -  Neo-Adjuvant Anti-estrogen oral therapy Patient started on Arimidex today after initial evaluation (Dr Lona Kettle). Also discussed with Dr Lake Bells and Dr Brantley Stage to reconsider surgery after optimization of her pulmonary status, perhaps with Local anesthesia.   12/20/2013 Surgery right breast lumpectomy without SLN biopsy, negative margins    12/20/2013 Pathologic Stage pT1cNxMx, G2, LVI (-),  tumor measures 1.2cm. Surgical margins are negative.     CURRENT THERAPY: Anastrazole 68m daily, started on 11/18/2013  INTERVAL HISTORY: She returns for follow up.  She is doing well overall. She tolerates Arimidex well, with occasional hot flush, tolerable. No other noticeable side effects. She is on oxygen continuously, able to do all ADLs, and some light house works. No join or muscular pain.   REVIEW OF SYSTEMS:   Constitutional: Denies fevers, chills or abnormal weight loss Eyes: Denies blurriness of vision Ears, nose, mouth, throat, and face: Denies mucositis or sore throat Respiratory: (+) cough and dyspnea, no wheezes Cardiovascular: Denies palpitation, chest discomfort or lower extremity swelling Gastrointestinal:  Denies nausea, heartburn or change in bowel habits Skin: Denies abnormal skin rashes Lymphatics: Denies new lymphadenopathy or easy bruising Neurological:Denies numbness, tingling or new weaknesses Behavioral/Psych: Mood is stable, no new changes  All other systems were reviewed with the patient and are negative.  MEDICAL HISTORY:  Past Medical History  Diagnosis Date  . Hyperlipidemia   . Hypertension   . GERD (gastroesophageal reflux disease)   . COPD (chronic obstructive pulmonary disease)   . Osteopenia   . Breast calcification seen on mammogram     Right breast  . Hyperglycemia   . Peripheral edema   . Vitamin D deficiency   . Allergic rhinitis   . Low back pain   . Wears glasses     reading  . Wears dentures     top  . On home oxygen therapy     all the time  . Shortness of breath   . Arthritis   . Subclavian artery stenosis, left     SURGICAL HISTORY: Past Surgical History  Procedure Laterality Date  . Cataract extraction  both eyes  . Breast lumpectomy with radioactive seed localization Right 12/20/2013    Procedure: RIGHT BREAST LUMPECTOMY WITH RADIOACTIVE SEED LOCALIZATION;  Surgeon: Erroll Luna, MD;  Location: [place];  Service: General;  Laterality: Right;    I have reviewed the social history and family history with the patient and they are unchanged from previous note.  ALLERGIES:  has No Known Allergies.  MEDICATIONS:  Current Outpatient Prescriptions  Medication Sig Dispense Refill  . acetaminophen (TYLENOL) 325 MG tablet Take 650 mg by mouth every 6 (six) hours as needed.    Marland Kitchen albuterol (PROVENTIL HFA;VENTOLIN HFA) 108 (90 BASE) MCG/ACT inhaler Inhale 2 puffs into the lungs every 6 (six) hours as needed for wheezing or shortness of breath.    . anastrozole (ARIMIDEX) 1 MG tablet Take 1 tablet (1 mg total) by mouth daily. 90 tablet 3  . aspirin 81 MG tablet Take 81 mg by mouth daily.    . Cholecalciferol (VITAMIN D) 2000 UNITS CAPS Take 1 capsule by mouth daily.    . Cyanocobalamin (VITAMIN B 12 PO) Take 1 tablet by mouth daily.    . furosemide (LASIX) 40 MG tablet Take 1 tablet (40 mg total) by mouth daily. 30 tablet 6  . hydrochlorothiazide (MICROZIDE) 12.5 MG capsule Take 12.5 mg by mouth daily.    Marland Kitchen lisinopril (PRINIVIL,ZESTRIL) 40 MG tablet Take 40 mg by mouth daily.    Marland Kitchen lovastatin (MEVACOR) 40 MG tablet Take 40 mg by mouth at bedtime.    Marland Kitchen omeprazole (PRILOSEC) 20 MG capsule Take 20 mg by mouth daily.    . potassium chloride (K-DUR) 10 MEQ tablet Take 10 mEq by mouth daily.    . Tiotropium Bromide-Olodaterol 2.5-2.5 MCG/ACT AERS Inhale 2 puffs into the lungs daily. 1 Inhaler 5   No current facility-administered medications for this visit.    PHYSICAL EXAMINATION: ECOG PERFORMANCE STATUS: 2 - Symptomatic, <50% confined to bed  There were no vitals filed for this visit. There were no vitals filed for this visit.  GENERAL:alert, no distress and comfortable SKIN: skin color, texture, turgor are normal, no rashes or significant lesions EYES: normal, Conjunctiva are pink and non-injected, sclera clear OROPHARYNX:no exudate, no erythema and lips, buccal mucosa, and tongue  normal  NECK: supple, thyroid normal size, non-tender, without nodularity LYMPH:  no palpable lymphadenopathy in the cervical, axillary or inguinal LUNGS: clear to auscultation and percussion with normal breathing effort HEART: regular rate & rhythm and no murmurs and no lower extremity edema ABDOMEN:abdomen soft, non-tender and normal bowel sounds Musculoskeletal:no cyanosis of digits and no clubbing  NEURO: alert & oriented x 3 with fluent speech, no focal motor/sensory deficits  LABORATORY DATA:  I have reviewed the data as listed CBC Latest Ref Rng 01/27/2014 12/20/2013 12/18/2013  WBC 3.9 - 10.3 10e3/uL 8.7 - 9.7  Hemoglobin 11.6 - 15.9 g/dL 11.1(L) 10.9(L) 11.1(L)  Hematocrit 34.8 - 46.6 % 34.4(L) - 35.5(L)  Platelets 145 - 400 10e3/uL 279 - 310     CMP Latest Ref Rng 03/20/2014 01/27/2014 12/18/2013  Glucose 70 - 99 mg/dL 105(H) 82 97  BUN 6 - 23 mg/dL 32(H) 33.5(H) 28(H)  Creatinine 0.40 - 1.20 mg/dL 1.04 1.0 0.95  Sodium 135 - 145 mEq/L 140 141 140  Potassium 3.5 - 5.1 mEq/L 4.1 3.3(L) 3.8  Chloride 96 - 112 mEq/L 103 - 99  CO2 19 - 32 mEq/L 32 29 27  Calcium 8.4 - 10.5 mg/dL 9.4 9.7 9.6  Total Protein  6.4 - 8.3 g/dL - 6.9 7.5  Total Bilirubin 0.20 - 1.20 mg/dL - 0.21 <0.2(L)  Alkaline Phos 40 - 150 U/L - 62 60  AST 5 - 34 U/L - 10 13  ALT 0 - 55 U/L - 13 11   PATHOLOGY REPORT  12/20/2013 Diagnosis 1. Breast, lumpectomy, Right - INVASIVE DUCTAL CARCINOMA, GRADE 2, SPANNING 1.2 CM. - DUCTAL CARCINOMA IN SITU. - BIOPSY SITE. - FINAL RESECTION MARGINS ARE NEGATIVE. - SEE ONCOLOGY TABLE. 2. Breast, excision, Right additional deep margin - BENIGN FIBROADIPOSE TISSUE. - NO MALIGNANCY IDENTIFIED. 3. Breast, excision, Right additional lateral margin - BENIGN BREAST TISSUE WITH FIBROCYSTIC CHANGE. - NO MALIGNANCY IDENTIFIED. 1 of 3 FINAL for Courtney, Cline A [phone]) Microscopic Comment 1. BREAST, INVASIVE TUMOR, WITH LYMPH NODES PRESENT Specimen, including  laterality and lymph node sampling (sentinel, non-sentinel): Right breast lump. Procedure: Right breast lumpectomy. Histologic type: Invasive ductal carcinoma. Grade: 2 Tubule formation: 3 Nuclear pleomorphism: 2 Mitotic:1 Tumor size (gross measurement): 1.2 cm Margins: Invasive, distance to closest margin: Present at cauterized deep margin of original specimen. Additional deep margin (part #2) is negative. In-situ, distance to closest margin: Present at cauterized deep margin of original specimen. Additional deep margin (part #2) is negative. If margin positive, focally or broadly: Original is focal. Final is negative. Lymphovascular invasion: Absent. Ductal carcinoma in situ: Present. Grade: II Extensive intraductal component: No. Lobular neoplasia: Absent. Tumor focality: Unifocal. Treatment effect: [name]. Extent of tumor: Confined to breast parenchyma. Lymph nodes: Not sampled. Examined: 0 Sentinel 0 Non-sentinel 0 Total Lymph nodes with metastasis: 0 Isolated tumor cells (< 0.2 mm): 0 Micrometastasis: (> 0.2 mm and < 2.0 mm): 0 Macrometastasis: (> 2.0 mm): 0 Extracapsular extension: 0 Breast prognostic profile: Performed on [id number] (results below). Her 2 neu will be repeated on current specimen. Estrogen receptor: Positive, strong intensity. Progesterone receptor: Positive, strong intensity. Her 2 neu: Not amplified. Ki-67: 7%    RADIOGRAPHIC STUDIES: I have personally reviewed the radiological images as listed and agreed with the findings in the report. No results found.   ASSESSMENT & PLAN:  81 year old female with past medical history significant for COPD, on continuous 3 L/m oxygen, who was diagnosed with T1 cN0 M0, stage I a right breast invasive ductal carcinoma, status post lumpectomy.  1. Right breast invasive ductal carcinoma, T1 cN0 M0, stage Ia, G2, ER 100% positive, PR 100% positive, HER-2 negative. -She initially sought was not a surgical  candidate, but didn't tolerate lumpectomy with local anesthesia well. Her surgical margin was negative. -I recommend her continue adjuvant aromatase inhibitor anastrozole for total 5 years, to reduce the risks of cancer recurrence in the future. -Giving her advanced age and severe comorbidity, and early stage breast cancer, I do not recommend adjuvant chemotherapy. Giving the strong ER/PR positive, low Ki-67, negative HER-2/neu, I think that her recurrence score would be low even we did Oncotype. -Her case was presented in our breast tumor board before and adjuvant RT was not recommended. -Potential side effects of anastrozole were reviewed with patient and her husband again. She has been tolerating well. We'll continue for total 5 years.  -Continue yearly screening mammogram. She is due in September.  2. Anemia -She has mild normocytic anemia, which has not recovered since the surgery.  -I'll check her reticular count, ferritin, iron study and B12 on next visit.   3. Bone health:  -She had a bone density scan 1 year ago which showed osteopenia, we'll repeat next year. -Anastrozole can  cause osteoporosis or worsening osteopenia. -I encouraged her to continue taking calcium supplementation and vitamin D.  4. Severe COPD, on home oxygen -She is followed with her pulmonologist Dr. Lake Bells.  Plan #1 continue anastrozole, for a total of 5 years #2 return to clinic in 3 months with lab CBC and CMP.  All questions were answered. The patient knows to call the clinic with any problems, questions or concerns. No barriers to learning was detected.  I spent 20 minutes counseling the patient face to face. The total time spent in the appointment was 30 minutes and more than 50% was on counseling and review of test results     Truitt Merle, MD 04/28/2014 9:47 AM

## 2014-04-30 ENCOUNTER — Ambulatory Visit (HOSPITAL_COMMUNITY)
Admission: RE | Admit: 2014-04-30 | Discharge: 2014-04-30 | Disposition: A | Discharge status: Home / Self Care | Payer: Medicare Other | Source: Ambulatory Visit | Attending: Internal Medicine | Admitting: Internal Medicine

## 2014-04-30 DIAGNOSIS — R0602 Shortness of breath: Secondary | ICD-10-CM | POA: Diagnosis not present

## 2014-04-30 DIAGNOSIS — J432 Centrilobular emphysema: Secondary | ICD-10-CM | POA: Diagnosis present

## 2014-04-30 DIAGNOSIS — R600 Localized edema: Secondary | ICD-10-CM | POA: Diagnosis not present

## 2014-04-30 NOTE — Progress Notes (Signed)
2D Echocardiogram Complete.  04/30/2014   Courtney Cline, [name]

## 2014-05-12 ENCOUNTER — Encounter: Payer: Self-pay | Admitting: *Deleted

## 2014-05-13 ENCOUNTER — Other Ambulatory Visit: Payer: Self-pay

## 2014-05-13 MED ORDER — TIOTROPIUM BROMIDE-OLODATEROL 2.5-2.5 MCG/ACT IN AERS: 2.0000 | INHALATION_SPRAY | Freq: Every day | RESPIRATORY_TRACT | Status: DC

## 2014-06-03 ENCOUNTER — Ambulatory Visit: Payer: Medicare Other | Admitting: Cardiovascular Disease

## 2014-06-17 ENCOUNTER — Other Ambulatory Visit: Payer: Self-pay | Admitting: Family

## 2014-06-25 ENCOUNTER — Ambulatory Visit (INDEPENDENT_AMBULATORY_CARE_PROVIDER_SITE_OTHER): Payer: Medicare Other | Admitting: Cardiology

## 2014-06-25 ENCOUNTER — Encounter: Payer: Self-pay | Admitting: Cardiology

## 2014-06-25 VITALS — BP 135/61 | HR 92 | Ht 61.0 in | Wt 193.8 lb

## 2014-06-25 DIAGNOSIS — Z79899 Other long term (current) drug therapy: Secondary | ICD-10-CM

## 2014-06-25 MED ORDER — FUROSEMIDE 40 MG PO TABS: 40.0000 mg | ORAL_TABLET | Freq: Two times a day (BID) | ORAL | Status: DC

## 2014-06-25 MED ORDER — POTASSIUM CHLORIDE CRYS ER 20 MEQ PO TBCR: 20.0000 meq | EXTENDED_RELEASE_TABLET | Freq: Every day | ORAL | Status: DC

## 2014-06-25 NOTE — Patient Instructions (Signed)
INCREASE your Lasix to 40 mg twice daily and Potassium to 20 MEQ Daily   Your physician recommends that you return for lab work in:1 week  Your physician recommends that you schedule a follow-up appointment in:1 week for a Blood pressure check  Your physician wants you to follow-up in: 6 months with Dr.Berry. You will receive a reminder letter in the mail two months in advance. If you don't receive a letter, please call our office to schedule the follow-up appointment.

## 2014-06-25 NOTE — Progress Notes (Signed)
06/25/2014 Courtney Cline   October 25, 1933  [id number]  Primary Physician Cain Saupe, MD Primary Cardiologist: Dr. Allyson Sabal  Reason for Visit/CC: Bilateral LEE  HPI:  The patient is a 81 y/o female followed by Dr Allyson Sabal with a h/o left subclavian artery stenosis, remote tobacco use having quit 22 years ago, treated hypertension and hyperlipidemia. In regards to her left subclavian stenosis, she has remained asymptomatic and plan is to continue conservative treatment. She was recently seen by Dr. Allyson Sabal in clinic 04/24/2014 for routine follow-up. At that time, she reported noticing increasing bilateral lower extremity edema which had improved with initiation of oral diuretic therapy. Dr. Allyson Sabal ordered a 2D echo to assess LVF. This demonstrated normal ejection fraction of 55-60% with grade 1 diastolic dysfunction. She also notes having LE dopplers that was negative for DVT.  Today in clinic, she notes continuation of lower extremity edema however this has improved some. She denies any increased dyspnea beyond her baseline (on chronic home O2 but has not had increase beyond 2 L). She states that she has been taking 40 mg of Lasix daily but made attempts to increase to twice a day for several days and noticed significant improvement. She reports adherence to a low-sodium diet. Last serum creatinine was 1.2.     Current Outpatient Prescriptions  Medication Sig Dispense Refill  . acetaminophen (TYLENOL) 325 MG tablet Take 650 mg by mouth every 6 (six) hours as needed.    Marland Kitchen albuterol (PROVENTIL HFA;VENTOLIN HFA) 108 (90 BASE) MCG/ACT inhaler Inhale 2 puffs into the lungs every 6 (six) hours as needed for wheezing or shortness of breath.    . anastrozole (ARIMIDEX) 1 MG tablet Take 1 tablet (1 mg total) by mouth daily. 90 tablet 3  . aspirin 81 MG tablet Take 81 mg by mouth daily.    . Cholecalciferol (VITAMIN D) 2000 UNITS CAPS Take 1 capsule by mouth daily.    . Cyanocobalamin (VITAMIN B 12 PO)  Take 1 tablet by mouth daily.    . furosemide (LASIX) 40 MG tablet Take 1 tablet (40 mg total) by mouth daily. 30 tablet 6  . lisinopril (PRINIVIL,ZESTRIL) 40 MG tablet Take 40 mg by mouth daily.    Marland Kitchen lovastatin (MEVACOR) 40 MG tablet Take 40 mg by mouth at bedtime.    Marland Kitchen omeprazole (PRILOSEC) 20 MG capsule Take 20 mg by mouth daily.    . potassium chloride (K-DUR) 10 MEQ tablet Take 10 mEq by mouth daily.    . Tiotropium Bromide-Olodaterol 2.5-2.5 MCG/ACT AERS Inhale 2 puffs into the lungs daily. 1 Inhaler 5   No current facility-administered medications for this visit.    No Known Allergies  History   Social History  . Marital Status: Married    Spouse Name: [name]  . Number of Children: [name]  . Years of Education: [name]   Occupational History  . retired     Programmer, applications   Social History Main Topics  . Smoking status: Former Smoker -- 1.00 packs/day for 40 years    Types: Cigarettes    Quit date: 02/14/1994  . Smokeless tobacco: Never Used  . Alcohol Use: No  . Drug Use: No  . Sexual Activity: Not on file   Other Topics Concern  . Not on file   Social History Narrative     Review of Systems: General: negative for chills, fever, night sweats or weight changes.  Cardiovascular: negative for chest pain, dyspnea on exertion, edema, orthopnea, palpitations, paroxysmal nocturnal  dyspnea or shortness of breath Dermatological: negative for rash Respiratory: negative for cough or wheezing Urologic: negative for hematuria Abdominal: negative for nausea, vomiting, diarrhea, bright red blood per rectum, melena, or hematemesis Neurologic: negative for visual changes, syncope, or dizziness All other systems reviewed and are otherwise negative except as noted above.    Blood pressure 135/61, pulse 92, height 5\' 1"  (1.549 m), weight 193 lb 12.8 oz (87.907 kg).  General appearance: alert, cooperative and no distress Neck: no carotid bruit and no JVD Lungs: clear to auscultation  bilaterally Heart: regular rate and rhythm, S1, S2 normal, no murmur, click, rub or gallop Extremities: no LEE Pulses: 2+ and symmetric Skin: warm and dry  Neurologic: Grossly normal   EKG not performed  ASSESSMENT AND PLAN:   1. LEE: Likely secondary to diastolic dysfunction. EF normal on recent 2-D echo. She has bilateral lower extremity edema but no dyspnea. We will further increase her Lasix to 40 mg twice a day. We'll also increase her potassium to 20 mEq daily. She will follow-up in one week for repeat BMP to reassess renal function and electrolytes. She will also follow-up for blood pressure check with RN.  PLAN  Repeat Labs and BP check in 1 week. F/u with Dr. Allyson Sabal in 6 months.   Sebron Mcmahill, BRITTAINYPA-C 06/25/2014 11:35 AM

## 2014-07-02 ENCOUNTER — Ambulatory Visit (INDEPENDENT_AMBULATORY_CARE_PROVIDER_SITE_OTHER): Payer: Medicare Other | Admitting: *Deleted

## 2014-07-02 VITALS — BP 112/64 | HR 92

## 2014-07-02 DIAGNOSIS — Z0130 Encounter for examination of blood pressure without abnormal findings: Secondary | ICD-10-CM

## 2014-07-02 NOTE — Progress Notes (Signed)
Pt returned for BP check per B. Rosita Fire PA.

## 2014-07-02 NOTE — Patient Instructions (Signed)
How to Take Your Blood Pressure HOW DO I GET A BLOOD PRESSURE MACHINE?  You can buy an electronic home blood pressure machine at your local pharmacy. Insurance will sometimes cover the cost if you have a prescription.  Ask your doctor what type of machine is best for you. There are different machines for your arm and your wrist.  If you decide to buy a machine to check your blood pressure on your arm, first check the size of your arm so you can buy the right size cuff. To check the size of your arm:   Use a measuring tape that shows both inches and centimeters.   Wrap the measuring tape around the upper-middle part of your arm. You may need someone to help you measure.   Write down your arm measurement in both inches and centimeters.   To measure your blood pressure correctly, it is important to have the right size cuff.   If your arm is up to 13 inches (up to 34 centimeters), get an adult cuff size.  If your arm is 13 to 17 inches (35 to 44 centimeters), get a large adult cuff size.    If your arm is 17 to 20 inches (45 to 52 centimeters), get an adult thigh cuff.  WHAT DO THE NUMBERS MEAN?   There are two numbers that make up your blood pressure. For example: 120/80.  The first number (120 in our example) is called the "systolic pressure." It is a measure of the pressure in your blood vessels when your heart is pumping blood.  The second number (80 in our example) is called the "diastolic pressure." It is a measure of the pressure in your blood vessels when your heart is resting between beats.  Your doctor will tell you what your blood pressure should be. WHAT SHOULD I DO BEFORE I CHECK MY BLOOD PRESSURE?   Try to rest or relax for at least 30 minutes before you check your blood pressure.  Do not smoke.  Do not have any drinks with caffeine, such as:  Soda.  Coffee.  Tea.  Check your blood pressure in a quiet room.  Sit down and stretch out your arm on a table.  Keep your arm at about the level of your heart. Let your arm relax.  Make sure that your legs are not crossed. HOW DO I CHECK MY BLOOD PRESSURE?  Follow the directions that came with your machine.  Make sure you remove any tight-fighting clothing from your arm or wrist. Wrap the cuff around your upper arm or wrist. You should be able to fit a finger between the cuff and your arm. If you cannot fit a finger between the cuff and your arm, it is too tight and should be removed and rewrapped.  Some units require you to manually pump up the arm cuff.  Automatic units inflate the cuff when you press a button.  Cuff deflation is automatic in both models.  After the cuff is inflated, the unit measures your blood pressure and pulse. The readings are shown on a monitor. Hold still and breathe normally while the cuff is inflated.  Getting a reading takes less than a minute.  Some models store readings in a memory. Some provide a printout of readings. If your machine does not store your readings, keep a written record.  Take readings with you to your next visit with your doctor. Document Released: 01/14/2008 Document Revised: 06/17/2013 Document Reviewed: 03/28/2013 [employer] Patient Information   2015 [employer]. This information is not intended to replace advice given to you by your health care provider. Make sure you discuss any questions you have with your health care provider.  

## 2014-07-03 LAB — BASIC METABOLIC PANEL
BUN: 30 mg/dL — AB (ref 6–23)
CHLORIDE: 101 meq/L (ref 96–112)
CO2: 29 mEq/L (ref 19–32)
CREATININE: 1.2 mg/dL — AB (ref 0.50–1.10)
Calcium: 9.5 mg/dL (ref 8.4–10.5)
Glucose, Bld: 116 mg/dL — ABNORMAL HIGH (ref 70–99)
Potassium: 4.4 mEq/L (ref 3.5–5.3)
Sodium: 139 mEq/L (ref 135–145)

## 2014-07-09 ENCOUNTER — Encounter: Payer: Self-pay | Admitting: Cardiovascular Disease

## 2014-07-28 ENCOUNTER — Encounter: Payer: Self-pay | Admitting: Hematology

## 2014-07-28 ENCOUNTER — Telehealth: Payer: Self-pay | Admitting: Hematology

## 2014-07-28 ENCOUNTER — Other Ambulatory Visit (HOSPITAL_BASED_OUTPATIENT_CLINIC_OR_DEPARTMENT_OTHER): Payer: Medicare Other

## 2014-07-28 ENCOUNTER — Ambulatory Visit (HOSPITAL_BASED_OUTPATIENT_CLINIC_OR_DEPARTMENT_OTHER): Payer: Medicare Other | Admitting: Hematology

## 2014-07-28 VITALS — BP 95/57 | HR 95 | Temp 97.8°F | Resp 18 | Ht 61.0 in | Wt 194.2 lb

## 2014-07-28 DIAGNOSIS — J449 Chronic obstructive pulmonary disease, unspecified: Secondary | ICD-10-CM

## 2014-07-28 DIAGNOSIS — Z170 Estrogen receptor positive status [ER+]: Secondary | ICD-10-CM | POA: Diagnosis not present

## 2014-07-28 DIAGNOSIS — N189 Chronic kidney disease, unspecified: Secondary | ICD-10-CM | POA: Diagnosis not present

## 2014-07-28 DIAGNOSIS — D649 Anemia, unspecified: Secondary | ICD-10-CM | POA: Diagnosis not present

## 2014-07-28 DIAGNOSIS — C50211 Malignant neoplasm of upper-inner quadrant of right female breast: Secondary | ICD-10-CM | POA: Diagnosis not present

## 2014-07-28 LAB — COMPREHENSIVE METABOLIC PANEL (CC13)
ALBUMIN: 3.5 g/dL (ref 3.5–5.0)
ALK PHOS: 61 U/L (ref 40–150)
ALT: 9 U/L (ref 0–55)
ANION GAP: 11 meq/L (ref 3–11)
AST: 13 U/L (ref 5–34)
BUN: 58 mg/dL — AB (ref 7.0–26.0)
CO2: 25 meq/L (ref 22–29)
Calcium: 9.1 mg/dL (ref 8.4–10.4)
Chloride: 108 mEq/L (ref 98–109)
Creatinine: 1.8 mg/dL — ABNORMAL HIGH (ref 0.6–1.1)
EGFR: 26 mL/min/{1.73_m2} — AB (ref >90)
GLUCOSE: 113 mg/dL (ref 70–140)
Potassium: 4.9 mEq/L (ref 3.5–5.1)
SODIUM: 143 meq/L (ref 136–145)
TOTAL PROTEIN: 6.8 g/dL (ref 6.4–8.3)
Total Bilirubin: 0.27 mg/dL (ref 0.20–1.20)

## 2014-07-28 LAB — CBC & DIFF AND RETIC
BASO%: 0.4 % (ref 0.0–2.0)
Basophils Absolute: 0 10*3/uL (ref 0.0–0.1)
EOS%: 3.8 % (ref 0.0–7.0)
Eosinophils Absolute: 0.2 10*3/uL (ref 0.0–0.5)
HCT: 29.5 % — ABNORMAL LOW (ref 34.8–46.6)
HEMOGLOBIN: 9.4 g/dL — AB (ref 11.6–15.9)
IMMATURE RETIC FRACT: 2.4 % (ref 1.60–10.00)
LYMPH#: 0.8 10*3/uL — AB (ref 0.9–3.3)
LYMPH%: 16 % (ref 14.0–49.7)
MCH: 29.9 pg (ref 25.1–34.0)
MCHC: 31.9 g/dL (ref 31.5–36.0)
MCV: 93.9 fL (ref 79.5–101.0)
MONO#: 0.4 10*3/uL (ref 0.1–0.9)
MONO%: 8.2 % (ref 0.0–14.0)
NEUT#: 3.4 10*3/uL (ref 1.5–6.5)
NEUT%: 71.6 % (ref 38.4–76.8)
Platelets: 189 10*3/uL (ref 145–400)
RBC: 3.14 10*6/uL — AB (ref 3.70–5.45)
RDW: 13.7 % (ref 11.2–14.5)
RETIC %: 1.35 % (ref 0.70–2.10)
Retic Ct Abs: 42.39 10*3/uL (ref 33.70–90.70)
WBC: 4.8 10*3/uL (ref 3.9–10.3)

## 2014-07-28 LAB — IRON AND TIBC CHCC
%SAT: 23 % (ref 21–57)
IRON: 61 ug/dL (ref 41–142)
TIBC: 261 ug/dL (ref 236–444)
UIBC: 201 ug/dL (ref 120–384)

## 2014-07-28 LAB — FERRITIN CHCC: FERRITIN: 66 ng/mL (ref 9–269)

## 2014-07-28 LAB — LACTATE DEHYDROGENASE (CC13): LDH: 161 U/L (ref 125–245)

## 2014-07-28 NOTE — Addendum Note (Signed)
Addended by: Truitt Merle on: 07/28/2014 11:07 AM   Modules accepted: Orders

## 2014-07-28 NOTE — Progress Notes (Signed)
[facility]  Telephone:([phone]) [phone] Fax:(336) [phone]  Clinic Follow up Note   Patient Care Team: Antony Blackbird, MD as PCP - General (Family Medicine) Juanito Doom, MD as Consulting Physician (Pulmonary Disease) Truitt Merle, MD as Consulting Physician (Hematology) Erroll Luna, MD as Consulting Physician (General Surgery) 07/28/2014  CHIEF COMPLAIN: Follow up  Oncology History   Breast cancer of upper-inner quadrant of right female breast   Staging form: Breast, AJCC 7th Edition     Clinical: Stage Unknown (T1c, NX, cM0) - Signed by Glean Salvo, MD on 11/19/2013       Staging comments: ER 100%+, PR 100%+, Ki-67 7%        Breast cancer of upper-inner quadrant of right female breast   12/06/2012 Imaging Bone Density performed at [facility] physicians T score Lumbar spine -0.1 (-0.7 in 2012), Right neck femur -1.4 (-1.2 before), Left neck femur -1.4 (-1.5 before) and Interpretation is OSTEOPENIA by WHO criteria.    10/11/2013 Mammogram Abnormal Screening mammogram showing Right Breast mass.   10/23/2013 Breast US Diagnostic mammogram and US showed an irregular hypoechoic mass at 9'0 clock position right breast 8 cm from nipple. [place] measurement 1.2x0.7x1.1 cm, no axillary adenopathy.   10/23/2013 Initial Biopsy US guided biopsy  done with clip placement.   10/23/2013 Pathology Results Invasive ductal carcinoma (IDC), DCIS, invasive cancer grade 2, ER 100%+, PR 100%+, Ki-67% 7%, HER-2/NEU by CISH No amplification, ratio of her2:cep17 1.00, average her2 copy number per cell 1.95 (HER 2 Negative tumor). Molecular Classification LUMINAL A.   11/01/2013 Surgery Initial surgical evaluation by Dr Marcello Moores Cornett: "Not good surgical candidate because of pulmonary status". Recommended anti-estrogen therapy.   11/18/2013 -  Anti-estrogen oral therapy Anastrozole 1 mg once daily   11/18/2013 -  Neo-Adjuvant Anti-estrogen oral therapy Patient started on Arimidex today after initial  evaluation (Dr Lona Kettle). Also discussed with Dr Lake Bells and Dr Brantley Stage to reconsider surgery after optimization of her pulmonary status, perhaps with Local anesthesia.   12/20/2013 Surgery right breast lumpectomy without SLN biopsy, negative margins    12/20/2013 Pathologic Stage pT1cNxMx, G2, LVI (-), tumor measures 1.2cm. Surgical margins are negative.      CURRENT THERAPY: Anastrozole 70m daily, started on 11/18/2013  INTERVAL HISTORY: She returns for follow up.  She is doing well overall. She noticed skin itchiness in the past few months, she feels it's related to her potasium pills. She otherwise doing well, mild hot flush, tolerable, no significant muscular or joint pain or stiffness. She has been on oxygen since 11/2013 for COPD. stable dyspnea on exertion.  REVIEW OF SYSTEMS:   Constitutional: Denies fevers, chills or abnormal weight loss, (+) weight gain  Eyes: Denies blurriness of vision Ears, nose, mouth, throat, and face: Denies mucositis or sore throat Respiratory: (+) cough and dyspnea, no wheezes Cardiovascular: Denies palpitation, chest discomfort or lower extremity swelling Gastrointestinal:  Denies nausea, heartburn or change in bowel habits Skin: Denies abnormal skin rashes Lymphatics: Denies new lymphadenopathy or easy bruising Neurological:Denies numbness, tingling or new weaknesses Behavioral/Psych: Mood is stable, no new changes  All other systems were reviewed with the patient and are negative.  MEDICAL HISTORY:  Past Medical History  Diagnosis Date  . Hyperlipidemia   . Hypertension   . GERD (gastroesophageal reflux disease)   . COPD (chronic obstructive pulmonary disease)   . Osteopenia   . Breast calcification seen on mammogram     Right breast  . Hyperglycemia   . Peripheral edema   .  Vitamin D deficiency   . Allergic rhinitis   . Low back pain   . Wears glasses     reading  . Wears dentures     top  . On home oxygen therapy     all the time  .  Shortness of breath   . Arthritis   . Subclavian artery stenosis, left     SURGICAL HISTORY: Past Surgical History  Procedure Laterality Date  . Cataract extraction      both eyes  . Breast lumpectomy with radioactive seed localization Right 12/20/2013    Procedure: RIGHT BREAST LUMPECTOMY WITH RADIOACTIVE SEED LOCALIZATION;  Surgeon: Erroll Luna, MD;  Location: [facility];  Service: General;  Laterality: Right;  . Carotid duplex  2014    I have reviewed the social history and family history with the patient and they are unchanged from previous note.  ALLERGIES:  has No Known Allergies.  MEDICATIONS:  Current Outpatient Prescriptions  Medication Sig Dispense Refill  . acetaminophen (TYLENOL) 325 MG tablet Take 650 mg by mouth every 6 (six) hours as needed.    Marland Kitchen albuterol (PROVENTIL HFA;VENTOLIN HFA) 108 (90 BASE) MCG/ACT inhaler Inhale 2 puffs into the lungs every 6 (six) hours as needed for wheezing or shortness of breath.    . anastrozole (ARIMIDEX) 1 MG tablet Take 1 tablet (1 mg total) by mouth daily. 90 tablet 3  . aspirin 81 MG tablet Take 81 mg by mouth daily.    . Cholecalciferol (VITAMIN D) 2000 UNITS CAPS Take 1 capsule by mouth daily.    . Cyanocobalamin (VITAMIN B 12 PO) Take 1 tablet by mouth daily.    . DiphenhydrAMINE HCl (BENADRYL PO) Take 1 tablet by mouth as needed.    . furosemide (LASIX) 40 MG tablet Take 1 tablet (40 mg total) by mouth 2 (two) times daily. 180 tablet 3  . lisinopril (PRINIVIL,ZESTRIL) 40 MG tablet Take 40 mg by mouth daily.    Marland Kitchen lovastatin (MEVACOR) 40 MG tablet Take 40 mg by mouth at bedtime.    Marland Kitchen omeprazole (PRILOSEC) 20 MG capsule Take 20 mg by mouth daily.    . potassium chloride SA (K-DUR,KLOR-CON) 20 MEQ tablet Take 1 tablet (20 mEq total) by mouth daily. 90 tablet 3  . Tiotropium Bromide-Olodaterol 2.5-2.5 MCG/ACT AERS Inhale 2 puffs into the lungs daily. 1 Inhaler 5   No current facility-administered medications for  this visit.    PHYSICAL EXAMINATION: ECOG PERFORMANCE STATUS: 2 - Symptomatic, <50% confined to bed  Filed Vitals:   07/28/14 1005  BP: 95/57  Pulse: 95  Temp: 97.8 F (36.6 C)  Resp: 18   Filed Weights   07/28/14 1005  Weight: 194 lb 3.2 oz (88.089 kg)    GENERAL:alert, no distress and comfortable SKIN: skin color, texture, turgor are normal, no rashes or significant lesions EYES: normal, Conjunctiva are pink and non-injected, sclera clear OROPHARYNX:no exudate, no erythema and lips, buccal mucosa, and tongue normal  NECK: supple, thyroid normal size, non-tender, without nodularity LYMPH:  no palpable lymphadenopathy in the cervical, axillary or inguinal LUNGS: clear to auscultation and percussion with normal breathing effort HEART: regular rate & rhythm and no murmurs and no lower extremity edema ABDOMEN:abdomen soft, non-tender and normal bowel sounds Musculoskeletal:no cyanosis of digits and no clubbing  NEURO: alert & oriented x 3 with fluent speech, no focal motor/sensory deficits Breasts: Breast inspection showed them to be symmetrical with no nipple discharge. Palpation of the breasts and axilla revealed  no obvious mass that I could appreciate.   LABORATORY DATA:  I have reviewed the data as listed CBC Latest Ref Rng 07/28/2014 04/28/2014 01/27/2014  WBC 3.9 - 10.3 10e3/uL 4.8 5.3 8.7  Hemoglobin 11.6 - 15.9 g/dL 9.4(L) 10.1(L) 11.1(L)  Hematocrit 34.8 - 46.6 % 29.5(L) 32.4(L) 34.4(L)  Platelets 145 - 400 10e3/uL 189 231 279     CMP Latest Ref Rng 07/28/2014 07/02/2014 04/28/2014  Glucose 70 - 140 mg/dl 113 116(H) 114  BUN 7.0 - 26.0 mg/dL 58.0(H) 30(H) 39.3(H)  Creatinine 0.6 - 1.1 mg/dL 1.8(H) 1.20(H) 1.2(H)  Sodium 136 - 145 mEq/L 143 139 143  Potassium 3.5 - 5.1 mEq/L 4.9 4.4 4.2  Chloride 96 - 112 mEq/L - 101 -  CO2 22 - 29 mEq/L _0 Calcium 8.4 - 10.4 mg/dL 9.1 9.5 9.5  Total Protein 6.4 - 8.3 g/dL 6.8 - 6.9  Total Bilirubin 0.20 - 1.20 mg/dL 0.27 -  0.28  Alkaline Phos 40 - 150 U/L 61 - 60  AST 5 - 34 U/L 13 - 12  ALT 0 - 55 U/L 9 - 11   PATHOLOGY REPORT  12/20/2013 Diagnosis 1. Breast, lumpectomy, Right - INVASIVE DUCTAL CARCINOMA, GRADE 2, SPANNING 1.2 CM. - DUCTAL CARCINOMA IN SITU. - BIOPSY SITE. - FINAL RESECTION MARGINS ARE NEGATIVE. - SEE ONCOLOGY TABLE. 2. Breast, excision, Right additional deep margin - BENIGN FIBROADIPOSE TISSUE. - NO MALIGNANCY IDENTIFIED. 3. Breast, excision, Right additional lateral margin - BENIGN BREAST TISSUE WITH FIBROCYSTIC CHANGE. - NO MALIGNANCY IDENTIFIED. 1 of 3 FINAL for ANONA, GIOVANNINI [phone]) Microscopic Comment 1. BREAST, INVASIVE TUMOR, WITH LYMPH NODES PRESENT Specimen, including laterality and lymph node sampling (sentinel, non-sentinel): Right breast lump. Procedure: Right breast lumpectomy. Histologic type: Invasive ductal carcinoma. Grade: 2 Tubule formation: 3 Nuclear pleomorphism: 2 Mitotic:1 Tumor size (gross measurement): 1.2 cm Margins: Invasive, distance to closest margin: Present at cauterized deep margin of original specimen. Additional deep margin (part #2) is negative. In-situ, distance to closest margin: Present at cauterized deep margin of original specimen. Additional deep margin (part #2) is negative. If margin positive, focally or broadly: Original is focal. Final is negative. Lymphovascular invasion: Absent. Ductal carcinoma in situ: Present. Grade: II Extensive intraductal component: No. Lobular neoplasia: Absent. Tumor focality: Unifocal. Treatment effect: [name]. Extent of tumor: Confined to breast parenchyma. Lymph nodes: Not sampled. Examined: 0 Sentinel 0 Non-sentinel 0 Total Lymph nodes with metastasis: 0 Isolated tumor cells (< 0.2 mm): 0 Micrometastasis: (> 0.2 mm and < 2.0 mm): 0 Macrometastasis: (> 2.0 mm): 0 Extracapsular extension: 0 Breast prognostic profile: Performed on [id number] (results below). Her 2 neu will be  repeated on current specimen. Estrogen receptor: Positive, strong intensity. Progesterone receptor: Positive, strong intensity. Her 2 neu: Not amplified. Ki-67: 7%    RADIOGRAPHIC STUDIES: I have personally reviewed the radiological images as listed and agreed with the findings in the report. No results found.   ASSESSMENT & PLAN:  81 year old female with past medical history significant for COPD, on continuous 3 L/m oxygen, who was diagnosed with T1 cN0 M0, stage I a right breast invasive ductal carcinoma, status post lumpectomy.  1. Right breast invasive ductal carcinoma, T1 cN0 M0, stage Ia, G2, ER 100% positive, PR 100% positive, HER-2 negative. -She initially sought was not a surgical candidate, but did tolerate lumpectomy with local anesthesia well. Her surgical margin was negative. -I recommend her continue adjuvant aromatase inhibitor anastrozole for total 5 years, to reduce the risks of  cancer recurrence in the future. -Giving her advanced age and severe comorbidity, and early stage breast cancer, I do not recommend adjuvant chemotherapy. Giving the strong ER/PR positive, low Ki-67, negative HER-2/neu, I think that her recurrence score would be low even we did Oncotype. -Her case was presented in our breast tumor board before and adjuvant RT was not recommended. -Potential side effects of anastrozole were reviewed with patient and her husband again. She has been tolerating well. We'll continue for total 5 years.  -Continue yearly screening mammogram. She is due in September.  2. Anemia -She has mild normocytic anemia, which has not recovered since the surgery.  -Her hemoglobin 9.4 today, slightly worse than before. The rest of anemia workup including reticular count, ferritin, iron study and B12 results are still pending.  3. Bone health:  -She had a bone density scan 1 year ago which showed osteopenia, we'll repeat next year. -Anastrozole can cause osteoporosis or worsening  osteopenia. -I encouraged her to continue taking calcium supplementation and vitamin D.  4. Severe COPD, on home oxygen -She is followed with her pulmonologist Dr. Lake Bells.  5. CKD, worsening Cr.  -Her creatinine is 1.8 today, increased from 1.4 months ago. -This is likely related to her Lasix, I'll inform her primary care physician Dr. Chapman Fitch.   Plan #1 continue anastrozole, for a total of 5 years #2 return to clinic in 3 months with lab CBC and CMP.  All questions were answered. The patient knows to call the clinic with any problems, questions or concerns. No barriers to learning was detected.  I spent 20 minutes counseling the patient face to face. The total time spent in the appointment was 30 minutes and more than 50% was on counseling and review of test results     Truitt Merle, MD 07/28/2014 10:54 AM

## 2014-07-28 NOTE — Telephone Encounter (Signed)
Gave and printed appt sched and avs for pt for Sept °

## 2014-07-29 LAB — VITAMIN D 25 HYDROXY (VIT D DEFICIENCY, FRACTURES): VIT D 25 HYDROXY: 31 ng/mL (ref 30–100)

## 2014-07-29 LAB — FOLATE RBC: RBC Folate: 1240 ng/mL (ref >280)

## 2014-07-29 LAB — VITAMIN B12: Vitamin B-12: 1059 pg/mL — ABNORMAL HIGH (ref 211–911)

## 2014-08-11 ENCOUNTER — Other Ambulatory Visit: Payer: Self-pay

## 2014-08-12 ENCOUNTER — Ambulatory Visit (INDEPENDENT_AMBULATORY_CARE_PROVIDER_SITE_OTHER): Payer: Medicare Other | Admitting: Pulmonary Disease

## 2014-08-12 ENCOUNTER — Encounter: Payer: Self-pay | Admitting: Pulmonary Disease

## 2014-08-12 VITALS — BP 122/68 | HR 89 | Ht 61.0 in | Wt 194.0 lb

## 2014-08-12 DIAGNOSIS — J432 Centrilobular emphysema: Secondary | ICD-10-CM

## 2014-08-12 DIAGNOSIS — J9611 Chronic respiratory failure with hypoxia: Secondary | ICD-10-CM

## 2014-08-12 NOTE — Assessment & Plan Note (Signed)
She has very severe COPD with gold class D symptoms, however she has had a stable interval. She is quite deconditioned but cannot afford pulmonary rehabilitation.    Plan: Today I gave her specific recommendations for exercises she can perform at home and encouraged her to walk up to 15 minutes daily Continue Stiolto daily Continue oxygen as written Get a flu shot in the fall

## 2014-08-12 NOTE — Progress Notes (Signed)
Subjective:    Patient ID: Courtney Cline, female    DOB: Mar 29, 1933, 81 y.o.   MRN: [id number]  Synopsis: GOLD Grade D COPD 2015 FEV1 33% pred, on 2L O2 continuously  HPI  Chief Complaint  Patient presents with  . Follow-up    Pt c/o some DOE.  pt states she is doing well overall.  Pt still on stiolto, doing well on this.     She says she has been doing OK since the last visit. No doctors visits for respiratory problems  since the last visit. No trouble with Stiolto, doesn't cost too much, no side effects, seems to be working. She takes proAir from time to time, 3-4 times a day. Mostly if she is exercising she needs it more. Doesn't cough much, scant mucus from time to time.   Past Medical History  Diagnosis Date  . Hyperlipidemia   . Hypertension   . GERD (gastroesophageal reflux disease)   . COPD (chronic obstructive pulmonary disease)   . Osteopenia   . Breast calcification seen on mammogram     Right breast  . Hyperglycemia   . Peripheral edema   . Vitamin D deficiency   . Allergic rhinitis   . Low back pain   . Wears glasses     reading  . Wears dentures     top  . On home oxygen therapy     all the time  . Shortness of breath   . Arthritis   . Subclavian artery stenosis, left      Review of Systems     Objective:   Physical Exam Filed Vitals:   08/12/14 1614  BP: 122/68  Pulse: 89  Height: 5\' 1"  (1.549 m)  Weight: 194 lb (87.998 kg)  SpO2: 97%   2L O2  Gen:, no acute distress HEENT: NCAT, EOMi, OP clear, PULM: Good air movement, no wheezing CV: RRR, no mgr, no JVD GI: BS+, soft, nontender,  Ext: warm, trace edema, no clubbing, no cyanosis Derm: no rash or skin breakdown Neuro: A&Ox4, MAEW        Assessment & Plan:   COPD (chronic obstructive pulmonary disease) She has very severe COPD with gold class D symptoms, however she has had a stable interval. She is quite deconditioned but cannot afford pulmonary  rehabilitation.    Plan: Today I gave her specific recommendations for exercises she can perform at home and encouraged her to walk up to 15 minutes daily Continue Stiolto daily Continue oxygen as written Get a flu shot in the fall  Chronic hypoxemic respiratory failure Continue 3 L of oxygen continuously    Updated Medication List Outpatient Encounter Prescriptions as of 08/12/2014  Medication Sig  . acetaminophen (TYLENOL) 325 MG tablet Take 650 mg by mouth every 6 (six) hours as needed.  Marland Kitchen albuterol (PROVENTIL HFA;VENTOLIN HFA) 108 (90 BASE) MCG/ACT inhaler Inhale 2 puffs into the lungs every 6 (six) hours as needed for wheezing or shortness of breath.  . anastrozole (ARIMIDEX) 1 MG tablet Take 1 tablet (1 mg total) by mouth daily.  Marland Kitchen aspirin 81 MG tablet Take 81 mg by mouth daily.  . Cholecalciferol (VITAMIN D) 2000 UNITS CAPS Take 1 capsule by mouth daily.  . Cyanocobalamin (VITAMIN B 12 PO) Take 1 tablet by mouth daily.  . DiphenhydrAMINE HCl (BENADRYL PO) Take 1 tablet by mouth as needed.  . furosemide (LASIX) 40 MG tablet Take 1 tablet (40 mg total) by mouth 2 (two) times daily.  Marland Kitchen  lisinopril (PRINIVIL,ZESTRIL) 40 MG tablet Take 40 mg by mouth daily.  Marland Kitchen lovastatin (MEVACOR) 40 MG tablet Take 40 mg by mouth at bedtime.  Marland Kitchen omeprazole (PRILOSEC) 20 MG capsule Take 20 mg by mouth daily.  . potassium chloride SA (K-DUR,KLOR-CON) 20 MEQ tablet Take 1 tablet (20 mEq total) by mouth daily.  . Tiotropium Bromide-Olodaterol 2.5-2.5 MCG/ACT AERS Inhale 2 puffs into the lungs daily.   No facility-administered encounter medications on file as of 08/12/2014.

## 2014-08-12 NOTE — Patient Instructions (Signed)
Continue taking your medications as you're doing Be sure to rinse your mouth after using inhalers Exercise regularly as we discussed today in clinic Keep using your oxygen is year doing Get a flu shot in the fall We will see you back in 6 months or sooner if needed

## 2014-08-12 NOTE — Assessment & Plan Note (Signed)
Continue 3 L of oxygen continuously 

## 2014-09-22 ENCOUNTER — Other Ambulatory Visit: Payer: Self-pay | Admitting: Hematology

## 2014-09-22 DIAGNOSIS — C50211 Malignant neoplasm of upper-inner quadrant of right female breast: Secondary | ICD-10-CM

## 2014-10-09 ENCOUNTER — Other Ambulatory Visit: Payer: Self-pay | Admitting: Hematology

## 2014-10-09 ENCOUNTER — Other Ambulatory Visit: Payer: Self-pay

## 2014-10-09 DIAGNOSIS — C50211 Malignant neoplasm of upper-inner quadrant of right female breast: Secondary | ICD-10-CM

## 2014-10-13 ENCOUNTER — Ambulatory Visit
Admission: RE | Admit: 2014-10-13 | Discharge: 2014-10-13 | Disposition: A | Discharge status: Home / Self Care | Payer: Medicare Other | Source: Ambulatory Visit | Attending: Hematology | Admitting: Hematology

## 2014-10-13 DIAGNOSIS — C50211 Malignant neoplasm of upper-inner quadrant of right female breast: Secondary | ICD-10-CM

## 2014-11-03 ENCOUNTER — Ambulatory Visit: Payer: Medicare Other | Admitting: Hematology

## 2014-11-03 ENCOUNTER — Other Ambulatory Visit: Payer: Medicare Other

## 2014-11-10 ENCOUNTER — Other Ambulatory Visit: Payer: Self-pay

## 2014-11-10 MED ORDER — TIOTROPIUM BROMIDE-OLODATEROL 2.5-2.5 MCG/ACT IN AERS: 2.0000 | INHALATION_SPRAY | Freq: Every day | RESPIRATORY_TRACT | Status: DC

## 2014-11-11 ENCOUNTER — Other Ambulatory Visit (HOSPITAL_BASED_OUTPATIENT_CLINIC_OR_DEPARTMENT_OTHER): Payer: Medicare Other

## 2014-11-11 ENCOUNTER — Encounter: Payer: Self-pay | Admitting: Hematology

## 2014-11-11 ENCOUNTER — Ambulatory Visit (HOSPITAL_BASED_OUTPATIENT_CLINIC_OR_DEPARTMENT_OTHER): Payer: Medicare Other | Admitting: Hematology

## 2014-11-11 ENCOUNTER — Telehealth: Payer: Self-pay | Admitting: Hematology

## 2014-11-11 VITALS — BP 108/58 | HR 106 | Temp 97.8°F | Resp 16 | Ht 61.0 in | Wt 194.4 lb

## 2014-11-11 DIAGNOSIS — C50211 Malignant neoplasm of upper-inner quadrant of right female breast: Secondary | ICD-10-CM

## 2014-11-11 DIAGNOSIS — Z170 Estrogen receptor positive status [ER+]: Secondary | ICD-10-CM

## 2014-11-11 DIAGNOSIS — M8580 Other specified disorders of bone density and structure, unspecified site: Secondary | ICD-10-CM | POA: Diagnosis not present

## 2014-11-11 DIAGNOSIS — N189 Chronic kidney disease, unspecified: Secondary | ICD-10-CM | POA: Diagnosis not present

## 2014-11-11 DIAGNOSIS — D649 Anemia, unspecified: Secondary | ICD-10-CM

## 2014-11-11 DIAGNOSIS — Z79811 Long term (current) use of aromatase inhibitors: Secondary | ICD-10-CM

## 2014-11-11 LAB — CBC & DIFF AND RETIC
BASO%: 0.6 % (ref 0.0–2.0)
Basophils Absolute: 0 10*3/uL (ref 0.0–0.1)
EOS%: 5.4 % (ref 0.0–7.0)
Eosinophils Absolute: 0.3 10*3/uL (ref 0.0–0.5)
HCT: 29 % — ABNORMAL LOW (ref 34.8–46.6)
HGB: 9.4 g/dL — ABNORMAL LOW (ref 11.6–15.9)
IMMATURE RETIC FRACT: 4.8 % (ref 1.60–10.00)
LYMPH#: 1 10*3/uL (ref 0.9–3.3)
LYMPH%: 20.2 % (ref 14.0–49.7)
MCH: 29.7 pg (ref 25.1–34.0)
MCHC: 32.6 g/dL (ref 31.5–36.0)
MCV: 91.1 fL (ref 79.5–101.0)
MONO#: 0.4 10*3/uL (ref 0.1–0.9)
MONO%: 8.7 % (ref 0.0–14.0)
NEUT%: 65.1 % (ref 38.4–76.8)
NEUTROS ABS: 3.3 10*3/uL (ref 1.5–6.5)
Platelets: 214 10*3/uL (ref 145–400)
RBC: 3.18 10*6/uL — AB (ref 3.70–5.45)
RDW: 13.5 % (ref 11.2–14.5)
RETIC %: 1.11 % (ref 0.70–2.10)
Retic Ct Abs: 35.3 10*3/uL (ref 33.70–90.70)
WBC: 5.1 10*3/uL (ref 3.9–10.3)

## 2014-11-11 LAB — COMPREHENSIVE METABOLIC PANEL (CC13)
ALBUMIN: 3.3 g/dL — AB (ref 3.5–5.0)
ALK PHOS: 74 U/L (ref 40–150)
ALT: 10 U/L (ref 0–55)
AST: 13 U/L (ref 5–34)
Anion Gap: 8 mEq/L (ref 3–11)
BUN: 27.8 mg/dL — AB (ref 7.0–26.0)
CO2: 31 meq/L — AB (ref 22–29)
Calcium: 9.2 mg/dL (ref 8.4–10.4)
Chloride: 105 mEq/L (ref 98–109)
Creatinine: 1.4 mg/dL — ABNORMAL HIGH (ref 0.6–1.1)
EGFR: 37 mL/min/{1.73_m2} — ABNORMAL LOW (ref >90)
Glucose: 125 mg/dl (ref 70–140)
POTASSIUM: 4.4 meq/L (ref 3.5–5.1)
SODIUM: 144 meq/L (ref 136–145)
TOTAL PROTEIN: 6.6 g/dL (ref 6.4–8.3)
Total Bilirubin: <0.3 mg/dL (ref 0.20–1.20)

## 2014-11-11 NOTE — Telephone Encounter (Signed)
Gave patient avs report and appointments for January 2017.

## 2014-11-11 NOTE — Progress Notes (Signed)
[place]  [phone] [phone]  Clinic Follow up Note   Patient Care Team: Antony Blackbird, MD as PCP - General (Family Medicine) Juanito Doom, MD as Consulting Physician (Pulmonary Disease) Truitt Merle, MD as Consulting Physician (Hematology) Erroll Luna, MD as Consulting Physician (General Surgery) 11/11/2014  CHIEF COMPLAIN: Follow up  Oncology History   Breast cancer of upper-inner quadrant of right female breast   Staging form: Breast, AJCC 7th Edition     Clinical: Stage Unknown (T1c, NX, cM0) - Signed by Glean Salvo, MD on 11/19/2013       Staging comments: ER 100%+, PR 100%+, Ki-67 7%        Breast cancer of upper-inner quadrant of right female breast   12/06/2012 Imaging Bone Density performed at [facility] physicians T score Lumbar spine -0.1 (-0.7 in 2012), Right neck femur -1.4 (-1.2 before), Left neck femur -1.4 (-1.5 before) and Interpretation is OSTEOPENIA by WHO criteria.    10/11/2013 Mammogram Abnormal Screening mammogram showing Right Breast mass.   10/23/2013 Breast US Diagnostic mammogram and US showed an irregular hypoechoic mass at 9'0 clock position right breast 8 cm from nipple. [place] measurement 1.2x0.7x1.1 cm, no axillary adenopathy.   10/23/2013 Initial Biopsy US guided biopsy  done with clip placement.   10/23/2013 Pathology Results Invasive ductal carcinoma (IDC), DCIS, invasive cancer grade 2, ER 100%+, PR 100%+, Ki-67% 7%, HER-2/NEU by CISH No amplification, ratio of her2:cep17 1.00, average her2 copy number per cell 1.95 (HER 2 Negative tumor). Molecular Classification LUMINAL A.   11/01/2013 Surgery Initial surgical evaluation by Dr Marcello Moores Cornett: "Not good surgical candidate because of pulmonary status". Recommended anti-estrogen therapy.   11/18/2013 -  Anti-estrogen oral therapy Anastrozole 1 mg once daily   11/18/2013 -  Neo-Adjuvant Anti-estrogen oral therapy Patient started on Arimidex today after initial  evaluation (Dr Lona Kettle). Also discussed with Dr Lake Bells and Dr Brantley Stage to reconsider surgery after optimization of her pulmonary status, perhaps with Local anesthesia.   12/20/2013 Surgery right breast lumpectomy without SLN biopsy, negative margins    12/20/2013 Pathologic Stage pT1cNxMx, G2, LVI (-), tumor measures 1.2cm. Surgical margins are negative.      CURRENT THERAPY: Anastrozole 52m daily, started on 11/18/2013  INTERVAL HISTORY: She returns for follow up.  She is doing well overall. She is on nasal cannula oxygen 3l/min, saturation well. She has been follow-up with her pulmonologist Dr. MLake Bells She is tolerating anastrozole well, mild hot flashes, manageable, no noticeable joint or muscular discomfort, or other side effects. She had mammogram 1 months ago which was negative. She takes Lasix 40 mg daily for her leg swelling.   REVIEW OF SYSTEMS:   Constitutional: Denies fevers, chills or abnormal weight loss Eyes: Denies blurriness of vision Ears, nose, mouth, throat, and face: Denies mucositis or sore throat Respiratory: (+) cough and dyspnea, no wheezes Cardiovascular: Denies palpitation, chest discomfort or lower extremity swelling Gastrointestinal:  Denies nausea, heartburn or change in bowel habits Skin: Denies abnormal skin rashes Lymphatics: Denies new lymphadenopathy or easy bruising Neurological:Denies numbness, tingling or new weaknesses Behavioral/Psych: Mood is stable, no new changes  All other systems were reviewed with the patient and are negative.  MEDICAL HISTORY:  Past Medical History  Diagnosis Date  . Hyperlipidemia   . Hypertension   . GERD (gastroesophageal reflux disease)   . COPD (chronic obstructive pulmonary disease)   . Osteopenia   . Breast calcification seen on mammogram     Right breast  .  Hyperglycemia   . Peripheral edema   . Vitamin D deficiency   . Allergic rhinitis   . Low back pain   . Wears glasses     reading  . Wears dentures      top  . On home oxygen therapy     all the time  . Shortness of breath   . Arthritis   . Subclavian artery stenosis, left     SURGICAL HISTORY: Past Surgical History  Procedure Laterality Date  . Cataract extraction      both eyes  . Breast lumpectomy with radioactive seed localization Right 12/20/2013    Procedure: RIGHT BREAST LUMPECTOMY WITH RADIOACTIVE SEED LOCALIZATION;  Surgeon: Erroll Luna, MD;  Location: [place];  Service: General;  Laterality: Right;  . Carotid duplex  2014    I have reviewed the social history and family history with the patient and they are unchanged from previous note.  ALLERGIES:  has No Known Allergies.  MEDICATIONS:  Current Outpatient Prescriptions  Medication Sig Dispense Refill  . acetaminophen (TYLENOL) 325 MG tablet Take 650 mg by mouth every 6 (six) hours as needed.    Marland Kitchen albuterol (PROVENTIL HFA;VENTOLIN HFA) 108 (90 BASE) MCG/ACT inhaler Inhale 2 puffs into the lungs every 6 (six) hours as needed for wheezing or shortness of breath.    . anastrozole (ARIMIDEX) 1 MG tablet Take 1 tablet (1 mg total) by mouth daily. 90 tablet 3  . aspirin 81 MG tablet Take 81 mg by mouth daily.    . Cholecalciferol (VITAMIN D) 2000 UNITS CAPS Take 1 capsule by mouth daily.    . Cyanocobalamin (VITAMIN B 12 PO) Take 1 tablet by mouth daily.    . DiphenhydrAMINE HCl (BENADRYL PO) Take 1 tablet by mouth as needed.    . furosemide (LASIX) 40 MG tablet Take 1 tablet (40 mg total) by mouth 2 (two) times daily. 180 tablet 3  . lisinopril (PRINIVIL,ZESTRIL) 40 MG tablet Take 40 mg by mouth daily.    Marland Kitchen lovastatin (MEVACOR) 40 MG tablet Take 40 mg by mouth at bedtime.    Marland Kitchen omeprazole (PRILOSEC) 20 MG capsule Take 20 mg by mouth daily.    . potassium chloride SA (K-DUR,KLOR-CON) 20 MEQ tablet Take 1 tablet (20 mEq total) by mouth daily. 90 tablet 3  . Tiotropium Bromide-Olodaterol 2.5-2.5 MCG/ACT AERS Inhale 2 puffs into the lungs daily. 1 Inhaler 5    No current facility-administered medications for this visit.    PHYSICAL EXAMINATION: ECOG PERFORMANCE STATUS: 2 - Symptomatic, <50% confined to bed  Filed Vitals:   11/11/14 1302  BP: 108/58  Pulse: 106  Temp: 97.8 F (36.6 C)  Resp: 16   Filed Weights   11/11/14 1302  Weight: 194 lb 6.4 oz (88.179 kg)    GENERAL:alert, no distress and comfortable SKIN: skin color, texture, turgor are normal, no rashes or significant lesions EYES: normal, Conjunctiva are pink and non-injected, sclera clear OROPHARYNX:no exudate, no erythema and lips, buccal mucosa, and tongue normal  NECK: supple, thyroid normal size, non-tender, without nodularity LYMPH:  no palpable lymphadenopathy in the cervical, axillary or inguinal LUNGS: clear to auscultation and percussion with normal breathing effort HEART: regular rate & rhythm and no murmurs and no lower extremity edema ABDOMEN:abdomen soft, non-tender and normal bowel sounds Musculoskeletal:no cyanosis of digits and no clubbing  NEURO: alert & oriented x 3 with fluent speech, no focal motor/sensory deficits Breasts: Breast inspection showed them to be symmetrical with no  nipple discharge. Palpation of the breasts and axilla revealed no obvious mass that I could appreciate.   LABORATORY DATA:  I have reviewed the data as listed CBC Latest Ref Rng 11/11/2014 07/28/2014 04/28/2014  WBC 3.9 - 10.3 10e3/uL 5.1 4.8 5.3  Hemoglobin 11.6 - 15.9 g/dL 9.4(L) 9.4(L) 10.1(L)  Hematocrit 34.8 - 46.6 % 29.0(L) 29.5(L) 32.4(L)  Platelets 145 - 400 10e3/uL 214 189 231     CMP Latest Ref Rng 11/11/2014 07/28/2014 07/02/2014  Glucose 70 - 140 mg/dl 125 113 116(H)  BUN 7.0 - 26.0 mg/dL 27.8(H) 58.0(H) 30(H)  Creatinine 0.6 - 1.1 mg/dL 1.4(H) 1.8(H) 1.20(H)  Sodium 136 - 145 mEq/L 144 143 139  Potassium 3.5 - 5.1 mEq/L 4.4 4.9 4.4  Chloride 96 - 112 mEq/L - - 101  CO2 22 - 29 mEq/L 31(H) 25 29  Calcium 8.4 - 10.4 mg/dL 9.2 9.1 9.5  Total Protein 6.4 - 8.3  g/dL 6.6 6.8 -  Total Bilirubin 0.20 - 1.20 mg/dL <0.30 0.27 -  Alkaline Phos 40 - 150 U/L 74 61 -  AST 5 - 34 U/L 13 13 -  ALT 0 - 55 U/L 10 9 -   PATHOLOGY REPORT  12/20/2013 Diagnosis 1. Breast, lumpectomy, Right - INVASIVE DUCTAL CARCINOMA, GRADE 2, SPANNING 1.2 CM. - DUCTAL CARCINOMA IN SITU. - BIOPSY SITE. - FINAL RESECTION MARGINS ARE NEGATIVE. - SEE ONCOLOGY TABLE. 2. Breast, excision, Right additional deep margin - BENIGN FIBROADIPOSE TISSUE. - NO MALIGNANCY IDENTIFIED. 3. Breast, excision, Right additional lateral margin - BENIGN BREAST TISSUE WITH FIBROCYSTIC CHANGE. - NO MALIGNANCY IDENTIFIED. 1 of 3 FINAL for AQUILLA, SHAMBLEY A [phone]) Microscopic Comment 1. BREAST, INVASIVE TUMOR, WITH LYMPH NODES PRESENT Specimen, including laterality and lymph node sampling (sentinel, non-sentinel): Right breast lump. Procedure: Right breast lumpectomy. Histologic type: Invasive ductal carcinoma. Grade: 2 Tubule formation: 3 Nuclear pleomorphism: 2 Mitotic:1 Tumor size (gross measurement): 1.2 cm Margins: Invasive, distance to closest margin: Present at cauterized deep margin of original specimen. Additional deep margin (part #2) is negative. In-situ, distance to closest margin: Present at cauterized deep margin of original specimen. Additional deep margin (part #2) is negative. If margin positive, focally or broadly: Original is focal. Final is negative. Lymphovascular invasion: Absent. Ductal carcinoma in situ: Present. Grade: II Extensive intraductal component: No. Lobular neoplasia: Absent. Tumor focality: Unifocal. Treatment effect: [name]. Extent of tumor: Confined to breast parenchyma. Lymph nodes: Not sampled. Examined: 0 Sentinel 0 Non-sentinel 0 Total Lymph nodes with metastasis: 0 Isolated tumor cells (< 0.2 mm): 0 Micrometastasis: (> 0.2 mm and < 2.0 mm): 0 Macrometastasis: (> 2.0 mm): 0 Extracapsular extension: 0 Breast prognostic profile:  Performed on [id number] (results below). Her 2 neu will be repeated on current specimen. Estrogen receptor: Positive, strong intensity. Progesterone receptor: Positive, strong intensity. Her 2 neu: Not amplified. Ki-67: 7%    RADIOGRAPHIC STUDIES: I have personally reviewed the radiological images as listed and agreed with the findings in the report. No results found.   ASSESSMENT & PLAN:  81 year old female with past medical history significant for COPD, on continuous 3 L/m oxygen, who was diagnosed with T1 cN0 M0, stage I a right breast invasive ductal carcinoma, status post lumpectomy.  1. Right breast invasive ductal carcinoma, T1 cN0 M0, stage Ia, G2, ER 100% positive, PR 100% positive, HER-2 negative. -She initially was thought not a surgical candidate, but did tolerate lumpectomy with local anesthesia well. Her surgical margin was negative. -I recommend her continue adjuvant aromatase inhibitor anastrozole  for total 5 years, to reduce the risks of cancer recurrence in the future. -Giving her advanced age and severe comorbidity, and early stage breast cancer, I do not recommend adjuvant chemotherapy. Giving the strong ER/PR positive, low Ki-67, negative HER-2/neu, I think that her recurrence score would be low even we did Oncotype. -Her case was presented in our breast tumor board before and adjuvant RT was not recommended. -She tolerating anastrozole well, we'll continue. -Continue yearly screening mammogram. Last mammogram on months ago was negative. Her lab reviewed, figures and was unremarkable, no evidence of recurrence.  2. Anemia -She has mild normocytic anemia, which has not recovered since the surgery.  -Her anemia workup including folic acid, Q19, ferritin and iron level were normal.  -This is likely anemia of chronic disease, secondary to her CKD -Hemoglobin 9.4, overall stable.  3. Bone health:  -She had a bone density scan 2 year ago which showed osteopenia, I  recommend her to have her repeated in one next month -Anastrozole can cause osteoporosis or worsening osteopenia. -I encouraged her to continue taking calcium supplementation and vitamin D.  4. Severe COPD, on home oxygen 3L/min -She is followed with her pulmonologist Dr. Lake Bells.  5. CKD. -Her creatinine is 1.4 today, improved from 1.83 months ago. -This is likely related to her Lasix, I'll inform her primary care physician Dr. Chapman Fitch, she'll follow-up with her in a few months.  Plan #1 continue anastrozole, for a total of 5 years #2 return to clinic in 4 months with lab CBC and CMP. #3 she will see her primary care physician Dr. Chapman Fitch and get a flu shot next months #4 she is due for bone density scan, she'll discuss with Dr. Chapman Fitch  All questions were answered. The patient knows to call the clinic with any problems, questions or concerns. No barriers to learning was detected.  I spent 20 minutes counseling the patient face to face. The total time spent in the appointment was 30 minutes and more than 50% was on counseling and review of test results     Truitt Merle, MD 11/11/2014 1:36 PM

## 2014-12-03 ENCOUNTER — Telehealth: Payer: Self-pay | Admitting: Pulmonary Disease

## 2014-12-03 NOTE — Telephone Encounter (Signed)
lmtcb x1 for Janet 

## 2014-12-04 NOTE — Telephone Encounter (Signed)
lmtcb X2 for [employer]

## 2014-12-05 NOTE — Telephone Encounter (Signed)
Courtney Cline - fax: [place] records  Spirometry report faxed to [place]. Nothing further needed.

## 2014-12-16 ENCOUNTER — Other Ambulatory Visit: Payer: Self-pay | Admitting: Hematology

## 2015-02-03 ENCOUNTER — Ambulatory Visit
Admission: RE | Admit: 2015-02-03 | Discharge: 2015-02-03 | Disposition: A | Discharge status: Home / Self Care | Payer: Medicare Other | Source: Ambulatory Visit | Attending: Family Medicine | Admitting: Family Medicine

## 2015-02-03 ENCOUNTER — Other Ambulatory Visit: Payer: Self-pay | Admitting: Family Medicine

## 2015-02-03 DIAGNOSIS — M549 Dorsalgia, unspecified: Secondary | ICD-10-CM

## 2015-02-16 ENCOUNTER — Other Ambulatory Visit: Payer: Self-pay | Admitting: Hematology

## 2015-02-18 ENCOUNTER — Telehealth: Payer: Self-pay | Admitting: *Deleted

## 2015-02-18 NOTE — Telephone Encounter (Signed)
Patient called reporting her "pharmacy still has not received authorization to refill Arimidex.  I missed a dose yesterday"  This nurse refilled at this t ime.

## 2015-03-10 ENCOUNTER — Other Ambulatory Visit (HOSPITAL_BASED_OUTPATIENT_CLINIC_OR_DEPARTMENT_OTHER): Payer: Medicare Other

## 2015-03-10 ENCOUNTER — Telehealth: Payer: Self-pay | Admitting: Hematology

## 2015-03-10 ENCOUNTER — Ambulatory Visit (HOSPITAL_BASED_OUTPATIENT_CLINIC_OR_DEPARTMENT_OTHER): Payer: Medicare Other | Admitting: Hematology

## 2015-03-10 ENCOUNTER — Encounter: Payer: Self-pay | Admitting: Hematology

## 2015-03-10 VITALS — BP 156/45 | HR 104 | Temp 97.7°F | Resp 18 | Ht 61.0 in | Wt 186.7 lb

## 2015-03-10 DIAGNOSIS — D638 Anemia in other chronic diseases classified elsewhere: Secondary | ICD-10-CM | POA: Insufficient documentation

## 2015-03-10 DIAGNOSIS — C50811 Malignant neoplasm of overlapping sites of right female breast: Secondary | ICD-10-CM

## 2015-03-10 DIAGNOSIS — N189 Chronic kidney disease, unspecified: Secondary | ICD-10-CM

## 2015-03-10 DIAGNOSIS — C50211 Malignant neoplasm of upper-inner quadrant of right female breast: Secondary | ICD-10-CM

## 2015-03-10 DIAGNOSIS — Z170 Estrogen receptor positive status [ER+]: Secondary | ICD-10-CM | POA: Diagnosis not present

## 2015-03-10 DIAGNOSIS — Z79811 Long term (current) use of aromatase inhibitors: Secondary | ICD-10-CM

## 2015-03-10 LAB — COMPREHENSIVE METABOLIC PANEL
ALT: <9 U/L (ref 0–55)
AST: 12 U/L (ref 5–34)
Albumin: 3.5 g/dL (ref 3.5–5.0)
Alkaline Phosphatase: 75 U/L (ref 40–150)
Anion Gap: 8 mEq/L (ref 3–11)
BUN: 26.5 mg/dL — AB (ref 7.0–26.0)
CHLORIDE: 105 meq/L (ref 98–109)
CO2: 29 mEq/L (ref 22–29)
Calcium: 9.4 mg/dL (ref 8.4–10.4)
Creatinine: 1.3 mg/dL — ABNORMAL HIGH (ref 0.6–1.1)
EGFR: 40 mL/min/{1.73_m2} — AB (ref >90)
Glucose: 117 mg/dl (ref 70–140)
POTASSIUM: 4.2 meq/L (ref 3.5–5.1)
SODIUM: 142 meq/L (ref 136–145)
Total Bilirubin: <0.3 mg/dL (ref 0.20–1.20)
Total Protein: 7 g/dL (ref 6.4–8.3)

## 2015-03-10 LAB — CBC & DIFF AND RETIC
BASO%: 0.3 % (ref 0.0–2.0)
Basophils Absolute: 0 10*3/uL (ref 0.0–0.1)
EOS%: 3.5 % (ref 0.0–7.0)
Eosinophils Absolute: 0.2 10*3/uL (ref 0.0–0.5)
HCT: 30.7 % — ABNORMAL LOW (ref 34.8–46.6)
HGB: 9.6 g/dL — ABNORMAL LOW (ref 11.6–15.9)
Immature Retic Fract: 8.6 % (ref 1.60–10.00)
LYMPH%: 18.1 % (ref 14.0–49.7)
MCH: 29 pg (ref 25.1–34.0)
MCHC: 31.3 g/dL — AB (ref 31.5–36.0)
MCV: 92.7 fL (ref 79.5–101.0)
MONO#: 0.5 10*3/uL (ref 0.1–0.9)
MONO%: 8.5 % (ref 0.0–14.0)
NEUT%: 69.6 % (ref 38.4–76.8)
NEUTROS ABS: 4 10*3/uL (ref 1.5–6.5)
Platelets: 209 10*3/uL (ref 145–400)
RBC: 3.31 10*6/uL — AB (ref 3.70–5.45)
RDW: 14.1 % (ref 11.2–14.5)
Retic %: 1.41 % (ref 0.70–2.10)
Retic Ct Abs: 46.67 10*3/uL (ref 33.70–90.70)
WBC: 5.8 10*3/uL (ref 3.9–10.3)
lymph#: 1 10*3/uL (ref 0.9–3.3)

## 2015-03-10 NOTE — Progress Notes (Signed)
[place]  Telephone:([phone]) [phone] [phone]  Clinic Follow up Note   Patient Care Team: Antony Blackbird, MD as PCP - General (Family Medicine) Juanito Doom, MD as Consulting Physician (Pulmonary Disease) Truitt Merle, MD as Consulting Physician (Hematology) Erroll Luna, MD as Consulting Physician (General Surgery) 03/10/2015  CHIEF COMPLAIN: Follow up  Oncology History   Breast cancer of upper-inner quadrant of right female breast   Staging form: Breast, AJCC 7th Edition     Clinical: Stage Unknown (T1c, NX, cM0) - Signed by Glean Salvo, MD on 11/19/2013       Staging comments: ER 100%+, PR 100%+, Ki-67 7%        Breast cancer of upper-inner quadrant of right female breast ([place])   12/06/2012 Imaging Bone Density performed at [facility] physicians T score Lumbar spine -0.1 (-0.7 in 2012), Right neck femur -1.4 (-1.2 before), Left neck femur -1.4 (-1.5 before) and Interpretation is OSTEOPENIA by WHO criteria.    10/11/2013 Mammogram Abnormal Screening mammogram showing Right Breast mass.   10/23/2013 Breast US Diagnostic mammogram and US showed an irregular hypoechoic mass at 9'0 clock position right breast 8 cm from nipple. [place] measurement 1.2x0.7x1.1 cm, no axillary adenopathy.   10/23/2013 Initial Biopsy US guided biopsy  done with clip placement.   10/23/2013 Pathology Results Invasive ductal carcinoma (IDC), DCIS, invasive cancer grade 2, ER 100%+, PR 100%+, Ki-67% 7%, HER-2/NEU by CISH No amplification, ratio of her2:cep17 1.00, average her2 copy number per cell 1.95 (HER 2 Negative tumor). Molecular Classification LUMINAL A.   11/01/2013 Surgery Initial surgical evaluation by Dr Marcello Moores Cornett: "Not good surgical candidate because of pulmonary status". Recommended anti-estrogen therapy.   11/18/2013 -  Anti-estrogen oral therapy Anastrozole 1 mg once daily   11/18/2013 -  Neo-Adjuvant Anti-estrogen oral therapy Patient started on Arimidex today after initial  evaluation (Dr Lona Kettle). Also discussed with Dr Lake Bells and Dr Brantley Stage to reconsider surgery after optimization of her pulmonary status, perhaps with Local anesthesia.   12/20/2013 Surgery right breast lumpectomy without SLN biopsy, negative margins    12/20/2013 Pathologic Stage pT1cNxMx, G2, LVI (-), tumor measures 1.2cm. Surgical margins are negative.      CURRENT THERAPY: Anastrozole 53m daily, started on 11/18/2013  INTERVAL HISTORY: She returns for follow up.  She is doing well overall. She has mild hot flush, tolerable, she also reports intermittent low back pain, which has been going on for 6 months, especially when she stands. She had x-ray by her PCP and her back pain is thought to be related to arthritis. She takes tylenol. She used take ibuprofen, but has quit due to her renal issue. She has good energy level and eating well. She is on chronic home oxygen 3 L/m, able to function well at home. No other new complaints  REVIEW OF SYSTEMS:   Constitutional: Denies fevers, chills or abnormal weight loss Eyes: Denies blurriness of vision Ears, nose, mouth, throat, and face: Denies mucositis or sore throat Respiratory: (+) cough and dyspnea, no wheezes Cardiovascular: Denies palpitation, chest discomfort or lower extremity swelling Gastrointestinal:  Denies nausea, heartburn or change in bowel habits Skin: Denies abnormal skin rashes Lymphatics: Denies new lymphadenopathy or easy bruising Neurological:Denies numbness, tingling or new weaknesses Behavioral/Psych: Mood is stable, no new changes  All other systems were reviewed with the patient and are negative.  MEDICAL HISTORY:  Past Medical History  Diagnosis Date  . Hyperlipidemia   . Hypertension   . GERD (gastroesophageal reflux disease)   .  COPD (chronic obstructive pulmonary disease) ([place])   . Osteopenia   . Breast calcification seen on mammogram     Right breast  . Hyperglycemia   . Peripheral edema   . Vitamin D deficiency    . Allergic rhinitis   . Low back pain   . Wears glasses     reading  . Wears dentures     top  . On home oxygen therapy     all the time  . Shortness of breath   . Arthritis   . Subclavian artery stenosis, left     SURGICAL HISTORY: Past Surgical History  Procedure Laterality Date  . Cataract extraction      both eyes  . Breast lumpectomy with radioactive seed localization Right 12/20/2013    Procedure: RIGHT BREAST LUMPECTOMY WITH RADIOACTIVE SEED LOCALIZATION;  Surgeon: Erroll Luna, MD;  Location: [place];  Service: General;  Laterality: Right;  . Carotid duplex  2014    I have reviewed the social history and family history with the patient and they are unchanged from previous note.  ALLERGIES:  has No Known Allergies.  MEDICATIONS:  Current Outpatient Prescriptions  Medication Sig Dispense Refill  . acetaminophen (TYLENOL) 325 MG tablet Take 650 mg by mouth every 6 (six) hours as needed.    Marland Kitchen albuterol (PROVENTIL HFA;VENTOLIN HFA) 108 (90 BASE) MCG/ACT inhaler Inhale 2 puffs into the lungs every 6 (six) hours as needed for wheezing or shortness of breath.    . anastrozole (ARIMIDEX) 1 MG tablet TAKE 1 TABLET (1 MG TOTAL) BY MOUTH DAILY. 90 tablet 3  . aspirin 81 MG tablet Take 81 mg by mouth daily.    . Cholecalciferol (VITAMIN D) 2000 UNITS CAPS Take 1 capsule by mouth daily.    . Cyanocobalamin (VITAMIN B 12 PO) Take 1 tablet by mouth daily.    . DiphenhydrAMINE HCl (BENADRYL PO) Take 1 tablet by mouth as needed.    . furosemide (LASIX) 40 MG tablet Take 1 tablet (40 mg total) by mouth 2 (two) times daily. (Patient taking differently: Take 40 mg by mouth daily. ) 180 tablet 3  . lisinopril (PRINIVIL,ZESTRIL) 40 MG tablet Take 40 mg by mouth daily.    Marland Kitchen lovastatin (MEVACOR) 40 MG tablet Take 40 mg by mouth at bedtime.    Marland Kitchen omeprazole (PRILOSEC) 20 MG capsule Take 20 mg by mouth daily.    . potassium chloride SA (K-DUR,KLOR-CON) 20 MEQ tablet Take 1  tablet (20 mEq total) by mouth daily. 90 tablet 3  . Tiotropium Bromide-Olodaterol 2.5-2.5 MCG/ACT AERS Inhale 2 puffs into the lungs daily. 1 Inhaler 5   No current facility-administered medications for this visit.    PHYSICAL EXAMINATION: ECOG PERFORMANCE STATUS: 2 - Symptomatic, <50% confined to bed  Filed Vitals:   03/10/15 1039  BP: 156/45  Pulse: 104  Temp: 97.7 F (36.5 C)  Resp: 18   Filed Weights   03/10/15 1039  Weight: 186 lb 11.2 oz (84.687 kg)    GENERAL:alert, no distress and comfortable SKIN: skin color, texture, turgor are normal, no rashes or significant lesions EYES: normal, Conjunctiva are pink and non-injected, sclera clear OROPHARYNX:no exudate, no erythema and lips, buccal mucosa, and tongue normal  NECK: supple, thyroid normal size, non-tender, without nodularity LYMPH:  no palpable lymphadenopathy in the cervical, axillary or inguinal LUNGS: clear to auscultation and percussion with normal breathing effort HEART: regular rate & rhythm and no murmurs and no lower extremity edema ABDOMEN:abdomen soft,  non-tender and normal bowel sounds Musculoskeletal:no cyanosis of digits and no clubbing  NEURO: alert & oriented x 3 with fluent speech, no focal motor/sensory deficits Breasts: Breast inspection showed them to be symmetrical with no nipple discharge. Palpation of the breasts and axilla revealed no obvious mass that I could appreciate.   LABORATORY DATA:  I have reviewed the data as listed CBC Latest Ref Rng 03/10/2015 11/11/2014 07/28/2014  WBC 3.9 - 10.3 10e3/uL 5.8 5.1 4.8  Hemoglobin 11.6 - 15.9 g/dL 9.6(L) 9.4(L) 9.4(L)  Hematocrit 34.8 - 46.6 % 30.7(L) 29.0(L) 29.5(L)  Platelets 145 - 400 10e3/uL 209 214 189     CMP Latest Ref Rng 03/10/2015 11/11/2014 07/28/2014  Glucose 70 - 140 mg/dl 117 125 113  BUN 7.0 - 26.0 mg/dL 26.5(H) 27.8(H) 58.0(H)  Creatinine 0.6 - 1.1 mg/dL 1.3(H) 1.4(H) 1.8(H)  Sodium 136 - 145 mEq/L 142 144 143  Potassium 3.5 -  5.1 mEq/L 4.2 4.4 4.9  Chloride 96 - 112 mEq/L - - -  CO2 22 - 29 mEq/L 29 31(H) 25  Calcium 8.4 - 10.4 mg/dL 9.4 9.2 9.1  Total Protein 6.4 - 8.3 g/dL 7.0 6.6 6.8  Total Bilirubin 0.20 - 1.20 mg/dL <0.30 <0.30 0.27  Alkaline Phos 40 - 150 U/L 75 74 61  AST 5 - 34 U/L _0 ALT 0 - 55 U/L <_1 PATHOLOGY REPORT  12/20/2013 Diagnosis 1. Breast, lumpectomy, Right - INVASIVE DUCTAL CARCINOMA, GRADE 2, SPANNING 1.2 CM. - DUCTAL CARCINOMA IN SITU. - BIOPSY SITE. - FINAL RESECTION MARGINS ARE NEGATIVE. - SEE ONCOLOGY TABLE. 2. Breast, excision, Right additional deep margin - BENIGN FIBROADIPOSE TISSUE. - NO MALIGNANCY IDENTIFIED. 3. Breast, excision, Right additional lateral margin - BENIGN BREAST TISSUE WITH FIBROCYSTIC CHANGE. - NO MALIGNANCY IDENTIFIED. 1 of 3 FINAL for BRITHANY, WHITWORTH A [id number]) Microscopic Comment 1. BREAST, INVASIVE TUMOR, WITH LYMPH NODES PRESENT Specimen, including laterality and lymph node sampling (sentinel, non-sentinel): Right breast lump. Procedure: Right breast lumpectomy. Histologic type: Invasive ductal carcinoma. Grade: 2 Tubule formation: 3 Nuclear pleomorphism: 2 Mitotic:1 Tumor size (gross measurement): 1.2 cm Margins: Invasive, distance to closest margin: Present at cauterized deep margin of original specimen. Additional deep margin (part #2) is negative. In-situ, distance to closest margin: Present at cauterized deep margin of original specimen. Additional deep margin (part #2) is negative. If margin positive, focally or broadly: Original is focal. Final is negative. Lymphovascular invasion: Absent. Ductal carcinoma in situ: Present. Grade: II Extensive intraductal component: No. Lobular neoplasia: Absent. Tumor focality: Unifocal. Treatment effect: [name]. Extent of tumor: Confined to breast parenchyma. Lymph nodes: Not sampled. Examined: 0 Sentinel 0 Non-sentinel 0 Total Lymph nodes with metastasis: 0 Isolated tumor  cells (< 0.2 mm): 0 Micrometastasis: (> 0.2 mm and < 2.0 mm): 0 Macrometastasis: (> 2.0 mm): 0 Extracapsular extension: 0 Breast prognostic profile: Performed on [id number] (results below). Her 2 neu will be repeated on current specimen. Estrogen receptor: Positive, strong intensity. Progesterone receptor: Positive, strong intensity. Her 2 neu: Not amplified. Ki-67: 7%    RADIOGRAPHIC STUDIES: I have personally reviewed the radiological images as listed and agreed with the findings in the report. No results found.   ASSESSMENT & PLAN:  82 year old female with past medical history significant for COPD, on continuous 3 L/m oxygen, who was diagnosed with T1 cN0 M0, stage I a right breast invasive ductal carcinoma, status post lumpectomy.  1. Right breast invasive ductal carcinoma, T1 cN0 M0, stage Ia, G2, ER  100% positive, PR 100% positive, HER-2 negative. -She initially was thought not a surgical candidate, but did tolerate lumpectomy with local anesthesia well. Her surgical margin was negative. -I recommend her continue adjuvant aromatase inhibitor anastrozole for total 5 years, to reduce the risks of cancer recurrence in the future. -Giving her advanced age and severe comorbidity, and early stage breast cancer, I do not recommend adjuvant chemotherapy. Giving the strong ER/PR positive, low Ki-67, negative HER-2/neu, I think that her recurrence score would be low even we did Oncotype. -Her case was presented in our breast tumor board before and adjuvant RT was not recommended. -She tolerating anastrozole well, we'll continue. -Continue yearly screening mammogram. Last mammogram in 09/2014 was negative. Her lab reviewed, which are unremarkable except anemia, no evidence of recurrence.  2. Anemia of chronic disease  -She has mild normocytic anemia, which has not recovered since the surgery.  -Her anemia workup including folic acid, V57, ferritin and iron level were normal.  -I'll check  SPEP and light chain level on her next visit. -This is likely anemia of chronic disease, secondary to her CKD -Hemoglobin 9.6, overall stable, she is not symptomatic, we'll continue monitoring. -We discussed the option of Aranesp for her anemia of chronic disease, the potential benefit and the side effects were discussed with her. She is not really interested for now.   3. Bone health:  -She had a bone density scan 2 year ago which showed osteopenia, I recommend her to have her repeated in one next month -Anastrozole can cause osteoporosis or worsening osteopenia. -I encouraged her to continue taking calcium supplementation and vitamin D. -She will contact her physician at [facility], to get a repeated bone DEXA scan.  4. Severe COPD, on home oxygen 3L/min -She is followed with her pulmonologist Dr. Lake Bells.  5. CKD. -Her creatinine is 1.3 today, slightly improved -She will continue follow-up with her primary care physician  Plan #1 continue anastrozole, for a total of 5 years #2 return to clinic in 4 months with lab CBC, CMP and SPEP/IFE. #3 she will see her primary care physician Dr. Chapman Fitch and get a bone density scan in the next few months   All questions were answered. The patient knows to call the clinic with any problems, questions or concerns. No barriers to learning was detected.  I spent 20 minutes counseling the patient face to face. The total time spent in the appointment was 30 minutes and more than 50% was on counseling and review of test results     Truitt Merle, MD 03/10/2015 10:53 AM

## 2015-03-10 NOTE — Telephone Encounter (Signed)
Pt confirmed labs/ov per 01/24 POF, gave pt AVS and Calendar... KJ °

## 2015-03-16 ENCOUNTER — Ambulatory Visit (INDEPENDENT_AMBULATORY_CARE_PROVIDER_SITE_OTHER): Payer: Medicare Other | Admitting: Pulmonary Disease

## 2015-03-16 ENCOUNTER — Encounter: Payer: Self-pay | Admitting: Pulmonary Disease

## 2015-03-16 VITALS — BP 134/62 | HR 102 | Ht 61.0 in | Wt 186.0 lb

## 2015-03-16 DIAGNOSIS — J432 Centrilobular emphysema: Secondary | ICD-10-CM | POA: Diagnosis not present

## 2015-03-16 DIAGNOSIS — J9611 Chronic respiratory failure with hypoxia: Secondary | ICD-10-CM | POA: Diagnosis not present

## 2015-03-16 DIAGNOSIS — R0982 Postnasal drip: Secondary | ICD-10-CM | POA: Insufficient documentation

## 2015-03-16 MED ORDER — IPRATROPIUM BROMIDE 0.03 % NA SOLN: 2.0000 | Freq: Two times a day (BID) | NASAL | Status: DC | PRN

## 2015-03-16 NOTE — Assessment & Plan Note (Signed)
This sounds like vasomotor rhinitis  Vasomotor rhinitis (chronic non-allergic rhinitis): When inhaled corticosteroids or antihistamines are not helpful, ipratropium nasal spray (0.03%, 2 puffs each nare tid) is often effective as demonstrated in two trials of 285 and 253 patients (J Allergy Clin Immunol. E803998 Pt 2):1123. nd J Allergy Clin Immunol. E803998 Pt 2):1117).  -start ipratropium nasal spray (0.03%) 2 puffs bid-tid prn

## 2015-03-16 NOTE — Assessment & Plan Note (Signed)
This has been a stable interval for her.  She continues to take the Stiolto and Albuterol Respiclick. Her flu shot is up-to-date.  Plan: Continue Stiolto Follow-up 6 months or sooner if needed

## 2015-03-16 NOTE — Assessment & Plan Note (Signed)
Continue 3 L continuously

## 2015-03-16 NOTE — Patient Instructions (Signed)
Keep taking Stiolto as you're doing Keep using oxygen as you're doing Take the ipratropium nasal sprays 2 sprays each nostril twice a day as needed for postnasal drip Keep using her oxygen as you are doing We will see you back in 6 months or sooner if needed

## 2015-03-16 NOTE — Progress Notes (Signed)
Subjective:    Patient ID: Courtney Cline, female    DOB: 16-Nov-1933, 82 y.o.   MRN: [id number]  Synopsis: GOLD Grade D COPD 2015 FEV1 33% pred, on 2L O2 continuously  HPI  Chief Complaint  Patient presents with  . Follow-up    pt c/o sob with exertion, runny nose and SOB worsened by cold weather.     Courtney Cline had a sinus infection a few months ago and said that it didn't go into her lungs.  She was given antibiotics. NO breathing trouble recently. She is coughing from time to time which she attributes to sinus drainage. No color to the drainage.  It is not constant.  It typically occurs in the mornings and evenings.  She is not taking anything for it.  No pain or pressure in her face but she has a mild headache.  No heart burn or indigestion.   Past Medical History  Diagnosis Date  . Hyperlipidemia   . Hypertension   . GERD (gastroesophageal reflux disease)   . COPD (chronic obstructive pulmonary disease) ([place])   . Osteopenia   . Breast calcification seen on mammogram     Right breast  . Hyperglycemia   . Peripheral edema   . Vitamin D deficiency   . Allergic rhinitis   . Low back pain   . Wears glasses     reading  . Wears dentures     top  . On home oxygen therapy     all the time  . Shortness of breath   . Arthritis   . Subclavian artery stenosis, left      Review of Systems     Objective:   Physical Exam Filed Vitals:   03/16/15 1553  BP: 134/62  Pulse: 102  Height: 5\' 1"  (1.549 m)  Weight: 186 lb (84.369 kg)  SpO2: 98%   2L O2  Gen:, no acute distress HEENT: NCAT, EOMi, OP clear, PULM: Good air movement, no wheezing CV: RRR, no mgr, no JVD GI: BS+, soft, nontender,  Ext: warm, trace edema, no clubbing, no cyanosis Derm: no rash or skin breakdown Neuro: A&Ox4, MAEW        Assessment & Plan:   No problem-specific assessment & plan notes found for this encounter.   Updated Medication List Outpatient Encounter Prescriptions as of  03/16/2015  Medication Sig  . acetaminophen (TYLENOL) 325 MG tablet Take 650 mg by mouth every 6 (six) hours as needed.  Marland Kitchen albuterol (PROVENTIL HFA;VENTOLIN HFA) 108 (90 BASE) MCG/ACT inhaler Inhale 2 puffs into the lungs every 6 (six) hours as needed for wheezing or shortness of breath.  . anastrozole (ARIMIDEX) 1 MG tablet TAKE 1 TABLET (1 MG TOTAL) BY MOUTH DAILY.  Marland Kitchen aspirin 81 MG tablet Take 81 mg by mouth daily.  . Cholecalciferol (VITAMIN D) 2000 UNITS CAPS Take 1 capsule by mouth daily.  . Cyanocobalamin (VITAMIN B 12 PO) Take 1 tablet by mouth daily.  . DiphenhydrAMINE HCl (BENADRYL PO) Take 1 tablet by mouth as needed.  . furosemide (LASIX) 40 MG tablet Take 1 tablet (40 mg total) by mouth 2 (two) times daily. (Patient taking differently: Take 40 mg by mouth daily. )  . lisinopril (PRINIVIL,ZESTRIL) 40 MG tablet Take 40 mg by mouth daily.  Marland Kitchen lovastatin (MEVACOR) 40 MG tablet Take 40 mg by mouth at bedtime.  Marland Kitchen omeprazole (PRILOSEC) 20 MG capsule Take 20 mg by mouth daily.  . potassium chloride SA (K-DUR,KLOR-CON) 20 MEQ tablet Take  1 tablet (20 mEq total) by mouth daily.  . Tiotropium Bromide-Olodaterol 2.5-2.5 MCG/ACT AERS Inhale 2 puffs into the lungs daily.   No facility-administered encounter medications on file as of 03/16/2015.

## 2015-03-17 ENCOUNTER — Ambulatory Visit (INDEPENDENT_AMBULATORY_CARE_PROVIDER_SITE_OTHER): Payer: Medicare Other | Admitting: Cardiovascular Disease

## 2015-03-17 ENCOUNTER — Encounter: Payer: Self-pay | Admitting: Cardiovascular Disease

## 2015-03-17 VITALS — BP 142/52 | HR 102 | Ht 61.0 in | Wt 187.0 lb

## 2015-03-17 DIAGNOSIS — I10 Essential (primary) hypertension: Secondary | ICD-10-CM

## 2015-03-17 DIAGNOSIS — Z79899 Other long term (current) drug therapy: Secondary | ICD-10-CM

## 2015-03-17 DIAGNOSIS — I708 Atherosclerosis of other arteries: Secondary | ICD-10-CM | POA: Diagnosis not present

## 2015-03-17 DIAGNOSIS — I771 Stricture of artery: Secondary | ICD-10-CM

## 2015-03-17 DIAGNOSIS — E785 Hyperlipidemia, unspecified: Secondary | ICD-10-CM

## 2015-03-17 DIAGNOSIS — R600 Localized edema: Secondary | ICD-10-CM

## 2015-03-17 NOTE — Assessment & Plan Note (Signed)
History of hyperlipidemia on lovastatin followed by her PCP 

## 2015-03-17 NOTE — Progress Notes (Signed)
03/17/2015 Courtney Cline   1933/11/16  [id number]  Primary Physician Antony Blackbird, MD Primary Cardiologist: Lorretta Harp MD Renae Gloss   HPI:  Courtney Cline is a 82 year old married Caucasian female mother of 2, grandmother 4 grandchildren who is accompanied by her husband today. She was initially referred to me by Dr. Antony Blackbird for peripheral vascular evaluation because of presumed left subclavian artery stenosis and/or steel. I last saw her in the office 04/24/14. Her cardiac risk factor profile is notable for remote tobacco abuse having quit 22 years ago, treated hypertension, and hyperlipidemia. She has never had a heart attack or stroke. She was a symptomatically with regards to left upper extremity claudication or dizziness and therefore continue conservative treatment of her subclavian artery stenosis was recommended at that time. She has noticed increasing bilateral lower extremity edema over the last one month which has improved somewhat with the addition of an oral diuretic. She does admit to dietary indiscretion. She is on chronic home O2 for her COPD and has not noticed any increasing shortness of breath.   Current Outpatient Prescriptions  Medication Sig Dispense Refill  . acetaminophen (TYLENOL) 325 MG tablet Take 650 mg by mouth every 6 (six) hours as needed.    Marland Kitchen albuterol (PROVENTIL HFA;VENTOLIN HFA) 108 (90 BASE) MCG/ACT inhaler Inhale 2 puffs into the lungs every 6 (six) hours as needed for wheezing or shortness of breath.    . anastrozole (ARIMIDEX) 1 MG tablet TAKE 1 TABLET (1 MG TOTAL) BY MOUTH DAILY. 90 tablet 3  . aspirin 81 MG tablet Take 81 mg by mouth daily.    . Cholecalciferol (VITAMIN D) 2000 UNITS CAPS Take 1 capsule by mouth daily.    . Cyanocobalamin (VITAMIN B 12 PO) Take 1 tablet by mouth daily.    . DiphenhydrAMINE HCl (BENADRYL PO) Take 1 tablet by mouth as needed.    . furosemide (LASIX) 40 MG tablet Take 40 mg by mouth 2 (two)  times daily.     Marland Kitchen ipratropium (ATROVENT) 0.03 % nasal spray Place 2 sprays into the nose 2 (two) times daily as needed for rhinitis. 30 mL 1  . lisinopril (PRINIVIL,ZESTRIL) 40 MG tablet Take 40 mg by mouth daily.    Marland Kitchen lovastatin (MEVACOR) 40 MG tablet Take 40 mg by mouth at bedtime.    Marland Kitchen omeprazole (PRILOSEC) 20 MG capsule Take 20 mg by mouth daily.    . potassium chloride SA (K-DUR,KLOR-CON) 20 MEQ tablet Take 1 tablet (20 mEq total) by mouth daily. 90 tablet 3  . Tiotropium Bromide-Olodaterol 2.5-2.5 MCG/ACT AERS Inhale 2 puffs into the lungs daily. 1 Inhaler 5   No current facility-administered medications for this visit.    No Known Allergies  Social History   Social History  . Marital Status: Married    Spouse Name: [name]  . Number of Children: [name]  . Years of Education: [name]   Occupational History  . retired     Chartered certified accountant   Social History Main Topics  . Smoking status: Former Smoker -- 1.00 packs/day for 40 years    Types: Cigarettes    Quit date: 02/14/1994  . Smokeless tobacco: Never Used  . Alcohol Use: No  . Drug Use: No  . Sexual Activity: Not on file   Other Topics Concern  . Not on file   Social History Narrative     Review of Systems: General: negative for chills, fever, night sweats or weight changes.  Cardiovascular:  negative for chest pain, dyspnea on exertion, edema, orthopnea, palpitations, paroxysmal nocturnal dyspnea or shortness of breath Dermatological: negative for rash Respiratory: negative for cough or wheezing Urologic: negative for hematuria Abdominal: negative for nausea, vomiting, diarrhea, bright red blood per rectum, melena, or hematemesis Neurologic: negative for visual changes, syncope, or dizziness All other systems reviewed and are otherwise negative except as noted above.    Blood pressure 142/52, pulse 102, height 5\' 1"  (1.549 m), weight 187 lb (84.823 kg).  General appearance: alert and no distress Neck: no adenopathy, no  carotid bruit, no JVD, supple, symmetrical, trachea midline and thyroid not enlarged, symmetric, no tenderness/mass/nodules Lungs: clear to auscultation bilaterally Heart: regular rate and rhythm, S1, S2 normal, no murmur, click, rub or gallop Extremities: extremities normal, atraumatic, no cyanosis or edema  EKG not performed today  ASSESSMENT AND PLAN:   Subclavian artery stenosis, left History of hemodynamically significant left subclavian artery stenosis with a blood pressure differential although there is no clinical evidence of steal or upper extremity claudication.  Essential hypertension History of hypertension blood pressure measured at 142/52 in the right arm. She is on lisinopril. Continue current meds at current dosing  Hyperlipidemia History of hyperlipidemia on lovastatin followed by her PCP      Lorretta Harp MD [facility], [facility] 03/17/2015 11:47 AM

## 2015-03-17 NOTE — Assessment & Plan Note (Addendum)
History of hemodynamically significant left subclavian artery stenosis with a blood pressure differential although there is no clinical evidence of steal or upper extremity claudication.

## 2015-03-17 NOTE — Assessment & Plan Note (Signed)
History of hypertension blood pressure measured at 142/52 in the right arm. She is on lisinopril. Continue current meds at current dosing

## 2015-03-17 NOTE — Patient Instructions (Signed)

## 2015-05-05 ENCOUNTER — Other Ambulatory Visit: Payer: Self-pay | Admitting: Family Medicine

## 2015-05-05 DIAGNOSIS — E2839 Other primary ovarian failure: Secondary | ICD-10-CM

## 2015-05-05 DIAGNOSIS — Z853 Personal history of malignant neoplasm of breast: Secondary | ICD-10-CM

## 2015-06-03 ENCOUNTER — Inpatient Hospital Stay: Admission: RE | Admit: 2015-06-03 | Payer: Medicare Other | Source: Ambulatory Visit

## 2015-06-08 ENCOUNTER — Ambulatory Visit (INDEPENDENT_AMBULATORY_CARE_PROVIDER_SITE_OTHER): Payer: Medicare Other | Admitting: Acute Care

## 2015-06-08 ENCOUNTER — Encounter: Payer: Self-pay | Admitting: Acute Care

## 2015-06-08 ENCOUNTER — Other Ambulatory Visit: Payer: Self-pay | Admitting: Pulmonary Disease

## 2015-06-08 ENCOUNTER — Ambulatory Visit (INDEPENDENT_AMBULATORY_CARE_PROVIDER_SITE_OTHER)
Admission: RE | Admit: 2015-06-08 | Discharge: 2015-06-08 | Disposition: A | Discharge status: Home / Self Care | Payer: Medicare Other | Source: Ambulatory Visit | Attending: Acute Care | Admitting: Acute Care

## 2015-06-08 VITALS — BP 138/64 | HR 83 | Temp 98.2°F | Ht 61.0 in | Wt 188.0 lb

## 2015-06-08 DIAGNOSIS — J432 Centrilobular emphysema: Secondary | ICD-10-CM

## 2015-06-08 MED ORDER — METHYLPREDNISOLONE ACETATE 80 MG/ML IJ SUSP
80.0000 mg | Freq: Once | INTRAMUSCULAR | Status: AC
  Administered 2015-06-08: 80 mg via INTRAMUSCULAR

## 2015-06-08 MED ORDER — LEVALBUTEROL HCL 0.63 MG/3ML IN NEBU
0.6300 mg | INHALATION_SOLUTION | Freq: Once | RESPIRATORY_TRACT | Status: AC
  Administered 2015-06-08: 0.63 mg via RESPIRATORY_TRACT

## 2015-06-08 MED ORDER — ALBUTEROL SULFATE (2.5 MG/3ML) 0.083% IN NEBU: 2.5000 mg | INHALATION_SOLUTION | Freq: Four times a day (QID) | RESPIRATORY_TRACT | Status: DC | PRN

## 2015-06-08 NOTE — Assessment & Plan Note (Addendum)
Increasing SOB x 1 month without desaturations.( 100%) on 3 L Compliant with Visual [employer]. Faint expiratory wheezing noted  Plan: We will get a chest x ray today. We will call you with the results. We will give you a depo medrol injection today for your chest tightness and wheezing. We will give you a neb treatment in the office today. If you feel this helps with your shortness of breath, we will have the office arrange for you to have a nebulizer machine and the medications at home. We will write a prescription for a Pro Air mist as your rescue inhaler. Use every 6 hours as needed for shortness of breath and wheezing. Continue seasonal allergy management Follow up in 3 weeks  Please contact office for sooner follow up if symptoms do not improve or worsen or seek emergency care

## 2015-06-08 NOTE — Progress Notes (Signed)
Subjective:    Patient ID: Courtney Cline, female    DOB: 12-05-1933, 82 y.o.   MRN: [id number]  HPI 82 year old female with GOLD Grade D COPD ,2015 FEV1 33% pred, on 2L O2 continuously. Maintained on Stialto. Followed by Dr. Lake Bells.   06/08/2015: Acute Office Visit: Pt. Presents to the office today with complaint of increasing shortness of breath over the past month.She states she has a sensation of chest tightness although her saturations are 100% on her maintenance 3L [place]. She states she has been using her rescue inhaler as directed, but is interested in switching to the Pro-Air HFA from the [facility].She denies any fever , chest pain, worsening of cough, increase in secretions, orthopnea, hemoptysis, leg pain or swelling.  04/30/2014: Echo: EF 60-65%   Current outpatient prescriptions:  .  acetaminophen (TYLENOL) 325 MG tablet, Take 650 mg by mouth every 6 (six) hours as needed., Disp: , Rfl:  .  albuterol (PROVENTIL HFA;VENTOLIN HFA) 108 (90 BASE) MCG/ACT inhaler, Inhale 2 puffs into the lungs every 6 (six) hours as needed for wheezing or shortness of breath., Disp: , Rfl:  .  anastrozole (ARIMIDEX) 1 MG tablet, TAKE 1 TABLET (1 MG TOTAL) BY MOUTH DAILY., Disp: 90 tablet, Rfl: 3 .  aspirin 81 MG tablet, Take 81 mg by mouth daily., Disp: , Rfl:  .  Cholecalciferol (VITAMIN D) 2000 UNITS CAPS, Take 1 capsule by mouth daily., Disp: , Rfl:  .  Cyanocobalamin (VITAMIN B 12 PO), Take 1 tablet by mouth daily., Disp: , Rfl:  .  DiphenhydrAMINE HCl (BENADRYL PO), Take 1 tablet by mouth as needed., Disp: , Rfl:  .  furosemide (LASIX) 40 MG tablet, Take 40 mg by mouth daily. , Disp: , Rfl:  .  lisinopril (PRINIVIL,ZESTRIL) 40 MG tablet, Take 40 mg by mouth daily., Disp: , Rfl:  .  lovastatin (MEVACOR) 40 MG tablet, Take 40 mg by mouth at bedtime., Disp: , Rfl:  .  omeprazole (PRILOSEC) 20 MG capsule, Take 20 mg by mouth daily., Disp: , Rfl:  .  potassium chloride SA (K-DUR,KLOR-CON) 20 MEQ  tablet, Take 1 tablet (20 mEq total) by mouth daily., Disp: 90 tablet, Rfl: 3 .  albuterol (PROVENTIL) (2.5 MG/3ML) 0.083% nebulizer solution, Take 3 mLs (2.5 mg total) by nebulization every 6 (six) hours as needed for wheezing or shortness of breath. Dx: J43.2, Disp: 75 mL, Rfl: 12 .  STIOLTO RESPIMAT 2.5-2.5 MCG/ACT AERS, INHALE 2 PUFFS INTO THE LUNGS DAILY., Disp: 4 g, Rfl: 5   Past Medical History  Diagnosis Date  . Hyperlipidemia   . Hypertension   . GERD (gastroesophageal reflux disease)   . COPD (chronic obstructive pulmonary disease) ([place])   . Osteopenia   . Breast calcification seen on mammogram     Right breast  . Hyperglycemia   . Peripheral edema   . Vitamin D deficiency   . Allergic rhinitis   . Low back pain   . Wears glasses     reading  . Wears dentures     top  . On home oxygen therapy     all the time  . Shortness of breath   . Arthritis   . Subclavian artery stenosis, left     No Known Allergies  Review of Systems Constitutional:   No  weight loss, night sweats,  Fevers, chills, fatigue, or  lassitude.  HEENT:   No headaches,  Difficulty swallowing,  Tooth/dental problems, or  Sore throat,  No sneezing, itching, ear ache, nasal congestion, post nasal drip,   CV:  No chest pain,  Orthopnea, PND, swelling in lower extremities, anasarca, dizziness, palpitations, syncope.   GI  No heartburn, indigestion, abdominal pain, nausea, vomiting, diarrhea, change in bowel habits, loss of appetite, bloody stools.   Resp: + shortness of breath with exertion not at rest.  No excess mucus, no productive cough,  No non-productive cough,  No coughing up of blood.  No change in color of mucus.  + wheezing.  No chest wall deformity  Skin: no rash or lesions.  GU: no dysuria, change in color of urine, no urgency or frequency.  No flank pain, no hematuria   MS:  No joint pain or swelling.  No decreased range of motion.  No back pain.  Psych:  No change in  mood or affect. No depression or anxiety.  No memory loss.        Objective:   Physical Exam  BP 138/64 mmHg  Pulse 83  Temp(Src) 98.2 F (36.8 C) (Oral)  Ht 5\' 1"  (1.549 m)  Wt 188 lb (85.276 kg)  BMI 35.54 kg/m2  SpO2 100%  Physical Exam:  General- No distress,  A&Ox3, pleasant elderly female ENT: No sinus tenderness, TM clear, pale nasal mucosa, no oral exudate,no post nasal drip, no LAN Cardiac: S1, S2, regular rate and rhythm, no murmur Chest: + faint wheeze/ no rales/ dullness; no accessory muscle use, no nasal flaring, no sternal retractions Abd.: Soft Non-tender Ext: No clubbing cyanosis, trace edema Neuro:  normal strength Skin: No rashes, warm and dry Psych: normal mood and behavior  Magdalen Spatz, AGACNP-BC [facility]   06/08/2015    Assessment & Plan:

## 2015-06-08 NOTE — Patient Instructions (Signed)
It is nice to meet you today. We will get a chest x ray today. We will call you with the results. We will give you a depo medrol injection today for your chest tightness and wheezing. We will give you a neb treatment in the office today. If you feel this helps with your shortness of breath, we will have the office arrange for you to have a nebulizer machine and the medications at home. We will write a prescription for a Pro Air mist as your rescue inhaler. Use every 6 hours as needed for shortness of breath and wheezing. Follow up in 3 weeks  Please contact office for sooner follow up if symptoms do not improve or worsen or seek emergency care

## 2015-06-10 ENCOUNTER — Telehealth: Payer: Self-pay | Admitting: Acute Care

## 2015-06-10 NOTE — Telephone Encounter (Signed)
Diane returned call. She states that medicare will not cover the albuterol with the original sig. She states that the medication needs to say "every 6 hours and as needed for wheezing and DOB". She states she will fax over a form for SG to sign to the fax number [phone]. She voiced understanding and had no further questions. Nothing further needed.

## 2015-06-10 NOTE — Telephone Encounter (Signed)
LVM for pt to return call

## 2015-06-11 ENCOUNTER — Other Ambulatory Visit: Payer: Self-pay | Admitting: *Deleted

## 2015-06-11 MED ORDER — ALBUTEROL SULFATE (2.5 MG/3ML) 0.083% IN NEBU: 2.5000 mg | INHALATION_SOLUTION | Freq: Four times a day (QID) | RESPIRATORY_TRACT | Status: DC

## 2015-06-11 NOTE — Progress Notes (Signed)
Reviewed, agree 

## 2015-06-12 ENCOUNTER — Other Ambulatory Visit: Payer: Medicare Other

## 2015-07-02 ENCOUNTER — Ambulatory Visit: Payer: Medicare Other | Admitting: Acute Care

## 2015-07-09 ENCOUNTER — Telehealth: Payer: Self-pay | Admitting: Hematology

## 2015-07-09 ENCOUNTER — Ambulatory Visit (HOSPITAL_BASED_OUTPATIENT_CLINIC_OR_DEPARTMENT_OTHER): Payer: Medicare Other | Admitting: Hematology

## 2015-07-09 ENCOUNTER — Other Ambulatory Visit (HOSPITAL_BASED_OUTPATIENT_CLINIC_OR_DEPARTMENT_OTHER): Payer: Medicare Other

## 2015-07-09 ENCOUNTER — Encounter: Payer: Self-pay | Admitting: Hematology

## 2015-07-09 VITALS — BP 146/59 | HR 105 | Temp 97.8°F | Resp 18 | Ht 61.0 in | Wt 186.7 lb

## 2015-07-09 DIAGNOSIS — D638 Anemia in other chronic diseases classified elsewhere: Secondary | ICD-10-CM | POA: Diagnosis not present

## 2015-07-09 DIAGNOSIS — C50811 Malignant neoplasm of overlapping sites of right female breast: Secondary | ICD-10-CM

## 2015-07-09 DIAGNOSIS — J449 Chronic obstructive pulmonary disease, unspecified: Secondary | ICD-10-CM

## 2015-07-09 DIAGNOSIS — C50211 Malignant neoplasm of upper-inner quadrant of right female breast: Secondary | ICD-10-CM

## 2015-07-09 DIAGNOSIS — N189 Chronic kidney disease, unspecified: Secondary | ICD-10-CM | POA: Diagnosis not present

## 2015-07-09 DIAGNOSIS — M8580 Other specified disorders of bone density and structure, unspecified site: Secondary | ICD-10-CM

## 2015-07-09 DIAGNOSIS — D649 Anemia, unspecified: Secondary | ICD-10-CM

## 2015-07-09 LAB — CBC & DIFF AND RETIC
BASO%: 0.2 % (ref 0.0–2.0)
BASOS ABS: 0 10*3/uL (ref 0.0–0.1)
EOS%: 2.5 % (ref 0.0–7.0)
Eosinophils Absolute: 0.1 10*3/uL (ref 0.0–0.5)
HEMATOCRIT: 29.6 % — AB (ref 34.8–46.6)
HGB: 9.3 g/dL — ABNORMAL LOW (ref 11.6–15.9)
Immature Retic Fract: 4 % (ref 1.60–10.00)
LYMPH#: 1.2 10*3/uL (ref 0.9–3.3)
LYMPH%: 21.6 % (ref 14.0–49.7)
MCH: 29.6 pg (ref 25.1–34.0)
MCHC: 31.4 g/dL — AB (ref 31.5–36.0)
MCV: 94.3 fL (ref 79.5–101.0)
MONO#: 0.4 10*3/uL (ref 0.1–0.9)
MONO%: 6.9 % (ref 0.0–14.0)
NEUT%: 68.8 % (ref 38.4–76.8)
NEUTROS ABS: 3.9 10*3/uL (ref 1.5–6.5)
Platelets: 169 10*3/uL (ref 145–400)
RBC: 3.14 10*6/uL — AB (ref 3.70–5.45)
RDW: 14.2 % (ref 11.2–14.5)
RETIC %: 0.99 % (ref 0.70–2.10)
RETIC CT ABS: 31.09 10*3/uL — AB (ref 33.70–90.70)
WBC: 5.7 10*3/uL (ref 3.9–10.3)

## 2015-07-09 LAB — COMPREHENSIVE METABOLIC PANEL
ALBUMIN: 3.5 g/dL (ref 3.5–5.0)
ALK PHOS: 66 U/L (ref 40–150)
ALT: <9 U/L (ref 0–55)
AST: 10 U/L (ref 5–34)
Anion Gap: 9 mEq/L (ref 3–11)
BUN: 26.1 mg/dL — AB (ref 7.0–26.0)
CALCIUM: 9 mg/dL (ref 8.4–10.4)
CO2: 28 mEq/L (ref 22–29)
Chloride: 107 mEq/L (ref 98–109)
Creatinine: 1.3 mg/dL — ABNORMAL HIGH (ref 0.6–1.1)
EGFR: 39 mL/min/{1.73_m2} — ABNORMAL LOW (ref >90)
GLUCOSE: 118 mg/dL (ref 70–140)
POTASSIUM: 4.5 meq/L (ref 3.5–5.1)
SODIUM: 143 meq/L (ref 136–145)
TOTAL PROTEIN: 6.8 g/dL (ref 6.4–8.3)

## 2015-07-09 NOTE — Telephone Encounter (Signed)
Gave pt apt & avs °

## 2015-07-09 NOTE — Progress Notes (Signed)
[facility]  [phone] [phone]  Clinic Follow up Note   Patient Care Team: Antony Blackbird, MD as PCP - General (Family Medicine) Juanito Doom, MD as Consulting Physician (Pulmonary Disease) Truitt Merle, MD as Consulting Physician (Hematology) Erroll Luna, MD as Consulting Physician (General Surgery) 07/09/2015  CHIEF COMPLAIN: Follow up breast cancer   Oncology History   Breast cancer of upper-inner quadrant of right female breast   Staging form: Breast, AJCC 7th Edition     Clinical: Stage Unknown (T1c, NX, cM0) - Signed by Glean Salvo, MD on 11/19/2013       Staging comments: ER 100%+, PR 100%+, Ki-67 7%        Breast cancer of upper-inner quadrant of right female breast ([place])   12/06/2012 Imaging Bone Density performed at [facility] physicians T score Lumbar spine -0.1 (-0.7 in 2012), Right neck femur -1.4 (-1.2 before), Left neck femur -1.4 (-1.5 before) and Interpretation is OSTEOPENIA by WHO criteria.    10/11/2013 Mammogram Abnormal Screening mammogram showing Right Breast mass.   10/23/2013 Breast US Diagnostic mammogram and US showed an irregular hypoechoic mass at 9'0 clock position right breast 8 cm from nipple. [place] measurement 1.2x0.7x1.1 cm, no axillary adenopathy.   10/23/2013 Initial Biopsy US guided biopsy  done with clip placement.   10/23/2013 Pathology Results Invasive ductal carcinoma (IDC), DCIS, invasive cancer grade 2, ER 100%+, PR 100%+, Ki-67% 7%, HER-2/NEU by CISH No amplification, ratio of her2:cep17 1.00, average her2 copy number per cell 1.95 (HER 2 Negative tumor). Molecular Classification LUMINAL A.   11/01/2013 Surgery Initial surgical evaluation by Dr Marcello Moores Cornett: "Not good surgical candidate because of pulmonary status". Recommended anti-estrogen therapy.   11/18/2013 -  Anti-estrogen oral therapy Anastrozole 1 mg once daily   11/18/2013 -  Neo-Adjuvant Anti-estrogen oral therapy Patient started on Arimidex  today after initial evaluation (Dr Lona Kettle). Also discussed with Dr Lake Bells and Dr Brantley Stage to reconsider surgery after optimization of her pulmonary status, perhaps with Local anesthesia.   12/20/2013 Surgery right breast lumpectomy without SLN biopsy, negative margins    12/20/2013 Pathologic Stage pT1cNxMx, G2, LVI (-), tumor measures 1.2cm. Surgical margins are negative.      CURRENT THERAPY: Anastrozole 20m daily, started on 11/18/2013  INTERVAL HISTORY: She returns for follow up.  She is doing well overall. She is compliant with azatrozole, mild hot flush, manageble, she has thighn pain only when she sleeps on it, no pain when she sleeps on her back, or during the day. She is not sure if it's related to letrozole all her cholesterol medication. She has no other new complaints. She is oxygen 3 L continuously, she is able to function well at home.   REVIEW OF SYSTEMS:   Constitutional: Denies fevers, chills or abnormal weight loss Eyes: Denies blurriness of vision Ears, nose, mouth, throat, and face: Denies mucositis or sore throat Respiratory: (+) chronic cough and dyspnea, no wheezes Cardiovascular: Denies palpitation, chest discomfort or lower extremity swelling Gastrointestinal:  Denies nausea, heartburn or change in bowel habits Skin: Denies abnormal skin rashes Lymphatics: Denies new lymphadenopathy or easy bruising Neurological:Denies numbness, tingling or new weaknesses Behavioral/Psych: Mood is stable, no new changes  All other systems were reviewed with the patient and are negative.  MEDICAL HISTORY:  Past Medical History  Diagnosis Date  . Hyperlipidemia   . Hypertension   . GERD (gastroesophageal reflux disease)   . COPD (chronic obstructive pulmonary disease) ([place]   . Osteopenia   .  Breast calcification seen on mammogram     Right breast  . Hyperglycemia   . Peripheral edema   . Vitamin D deficiency   . Allergic rhinitis   . Low back pain   . Wears glasses      reading  . Wears dentures     top  . On home oxygen therapy     all the time  . Shortness of breath   . Arthritis   . Subclavian artery stenosis, left     SURGICAL HISTORY: Past Surgical History  Procedure Laterality Date  . Cataract extraction      both eyes  . Breast lumpectomy with radioactive seed localization Right 12/20/2013    Procedure: RIGHT BREAST LUMPECTOMY WITH RADIOACTIVE SEED LOCALIZATION;  Surgeon: Erroll Luna, MD;  Location: [facility];  Service: General;  Laterality: Right;  . Carotid duplex  2014    I have reviewed the social history and family history with the patient and they are unchanged from previous note.  ALLERGIES:  has No Known Allergies.  MEDICATIONS:  Current Outpatient Prescriptions  Medication Sig Dispense Refill  . acetaminophen (TYLENOL) 325 MG tablet Take 650 mg by mouth every 6 (six) hours as needed.    Marland Kitchen albuterol (PROVENTIL HFA;VENTOLIN HFA) 108 (90 BASE) MCG/ACT inhaler Inhale 2 puffs into the lungs every 6 (six) hours as needed for wheezing or shortness of breath.    Marland Kitchen albuterol (PROVENTIL) (2.5 MG/3ML) 0.083% nebulizer solution Take 3 mLs (2.5 mg total) by nebulization every 6 (six) hours. For wheezing or shortness of breath 120 mL 11  . anastrozole (ARIMIDEX) 1 MG tablet TAKE 1 TABLET (1 MG TOTAL) BY MOUTH DAILY. 90 tablet 3  . aspirin 81 MG tablet Take 81 mg by mouth daily.    . Cholecalciferol (VITAMIN D) 2000 UNITS CAPS Take 1 capsule by mouth daily.    . Cyanocobalamin (VITAMIN B 12 PO) Take 1 tablet by mouth daily.    . DiphenhydrAMINE HCl (BENADRYL PO) Take 1 tablet by mouth as needed.    . furosemide (LASIX) 40 MG tablet Take 40 mg by mouth daily.     Marland Kitchen lisinopril (PRINIVIL,ZESTRIL) 40 MG tablet Take 40 mg by mouth daily.    Marland Kitchen lovastatin (MEVACOR) 40 MG tablet Take 40 mg by mouth at bedtime.    Marland Kitchen omeprazole (PRILOSEC) 20 MG capsule Take 20 mg by mouth daily.    . potassium chloride SA (K-DUR,KLOR-CON) 20 MEQ  tablet Take 1 tablet (20 mEq total) by mouth daily. 90 tablet 3  . STIOLTO RESPIMAT 2.5-2.5 MCG/ACT AERS INHALE 2 PUFFS INTO THE LUNGS DAILY. 4 g 5   No current facility-administered medications for this visit.    PHYSICAL EXAMINATION: ECOG PERFORMANCE STATUS: 2 - Symptomatic, <50% confined to bed  Filed Vitals:   07/09/15 1305  BP: 146/59  Pulse: 105  Temp: 97.8 F (36.6 C)  Resp: 18   Filed Weights   07/09/15 1305  Weight: 186 lb 11.2 oz (84.687 kg)    GENERAL:alert, no distress and comfortable SKIN: skin color, texture, turgor are normal, no rashes or significant lesions EYES: normal, Conjunctiva are pink and non-injected, sclera clear OROPHARYNX:no exudate, no erythema and lips, buccal mucosa, and tongue normal  NECK: supple, thyroid normal size, non-tender, without nodularity LYMPH:  no palpable lymphadenopathy in the cervical, axillary or inguinal LUNGS: clear to auscultation and percussion with normal breathing effort HEART: regular rate & rhythm and no murmurs and no lower extremity edema ABDOMEN:abdomen  soft, non-tender and normal bowel sounds Musculoskeletal:no cyanosis of digits and no clubbing  NEURO: alert & oriented x 3 with fluent speech, no focal motor/sensory deficits Breasts: Breast inspection showed them to be symmetrical with no nipple discharge. Palpation of the breasts and axilla revealed no obvious mass that I could appreciate.   LABORATORY DATA:  I have reviewed the data as listed CBC Latest Ref Rng 07/09/2015 03/10/2015 11/11/2014  WBC 3.9 - 10.3 10e3/uL 5.7 5.8 5.1  Hemoglobin 11.6 - 15.9 g/dL 9.3(L) 9.6(L) 9.4(L)  Hematocrit 34.8 - 46.6 % 29.6(L) 30.7(L) 29.0(L)  Platelets 145 - 400 10e3/uL 169 209 214     CMP Latest Ref Rng 07/09/2015 03/10/2015 11/11/2014  Glucose 70 - 140 mg/dl 118 117 125  BUN 7.0 - 26.0 mg/dL 26.1(H) 26.5(H) 27.8(H)  Creatinine 0.6 - 1.1 mg/dL 1.3(H) 1.3(H) 1.4(H)  Sodium 136 - 145 mEq/L 143 142 144  Potassium 3.5 - 5.1  mEq/L 4.5 4.2 4.4  CO2 22 - 29 mEq/L 28 29 31(H)  Calcium 8.4 - 10.4 mg/dL 9.0 9.4 9.2  Total Protein 6.4 - 8.3 g/dL 6.8 7.0 6.6  Total Bilirubin 0.20 - 1.20 mg/dL <0.30 <0.30 <0.30  Alkaline Phos 40 - 150 U/L 66 75 74  AST 5 - 34 U/L _0 ALT 0 - 55 U/L <9 <9 10   PATHOLOGY REPORT  12/20/2013 Diagnosis 1. Breast, lumpectomy, Right - INVASIVE DUCTAL CARCINOMA, GRADE 2, SPANNING 1.2 CM. - DUCTAL CARCINOMA IN SITU. - BIOPSY SITE. - FINAL RESECTION MARGINS ARE NEGATIVE. - SEE ONCOLOGY TABLE. 2. Breast, excision, Right additional deep margin - BENIGN FIBROADIPOSE TISSUE. - NO MALIGNANCY IDENTIFIED. 3. Breast, excision, Right additional lateral margin - BENIGN BREAST TISSUE WITH FIBROCYSTIC CHANGE. - NO MALIGNANCY IDENTIFIED. 1 of 3 FINAL for KATLEEN, CARRAWAY A [phone]) Microscopic Comment 1. BREAST, INVASIVE TUMOR, WITH LYMPH NODES PRESENT Specimen, including laterality and lymph node sampling (sentinel, non-sentinel): Right breast lump. Procedure: Right breast lumpectomy. Histologic type: Invasive ductal carcinoma. Grade: 2 Tubule formation: 3 Nuclear pleomorphism: 2 Mitotic:1 Tumor size (gross measurement): 1.2 cm Margins: Invasive, distance to closest margin: Present at cauterized deep margin of original specimen. Additional deep margin (part #2) is negative. In-situ, distance to closest margin: Present at cauterized deep margin of original specimen. Additional deep margin (part #2) is negative. If margin positive, focally or broadly: Original is focal. Final is negative. Lymphovascular invasion: Absent. Ductal carcinoma in situ: Present. Grade: II Extensive intraductal component: No. Lobular neoplasia: Absent. Tumor focality: Unifocal. Treatment effect: [name]. Extent of tumor: Confined to breast parenchyma. Lymph nodes: Not sampled. Examined: 0 Sentinel 0 Non-sentinel 0 Total Lymph nodes with metastasis: 0 Isolated tumor cells (< 0.2 mm):  0 Micrometastasis: (> 0.2 mm and < 2.0 mm): 0 Macrometastasis: (> 2.0 mm): 0 Extracapsular extension: 0 Breast prognostic profile: Performed on [id number] (results below). Her 2 neu will be repeated on current specimen. Estrogen receptor: Positive, strong intensity. Progesterone receptor: Positive, strong intensity. Her 2 neu: Not amplified. Ki-67: 7%    RADIOGRAPHIC STUDIES: I have personally reviewed the radiological images as listed and agreed with the findings in the report. No results found.   ASSESSMENT & PLAN:  82 year old female with past medical history significant for COPD, on continuous 3 L/m oxygen, who was diagnosed with T1 cN0 M0, stage I a right breast invasive ductal carcinoma, status post lumpectomy.  1. Right breast invasive ductal carcinoma, T1 cN0 M0, stage Ia, G2, ER 100% positive, PR 100% positive, HER-2 negative. -She  initially was thought not a surgical candidate, but did tolerate lumpectomy with local anesthesia well. Her surgical margin was negative. -I recommend her continue adjuvant aromatase inhibitor anastrozole for total 5 years, to reduce the risks of cancer recurrence in the future. -Giving her advanced age and severe comorbidity, and early stage breast cancer, I do not recommend adjuvant chemotherapy. Giving the strong ER/PR positive, low Ki-67, negative HER-2/neu, I think that her recurrence score would be low even we did Oncotype. -Her case was presented in our breast tumor board before and adjuvant RT was not recommended. -She tolerating anastrozole well, we'll continue. -Continue yearly screening mammogram. Next dye in 09/2015. - Her lab reviewed, which are unremarkable except stable anemia, no evidence of recurrence.  2. Anemia of chronic disease  -She has mild normocytic anemia, which has not recovered since the surgery.  -Her anemia workup including folic acid, L24, ferritin and iron level were normal.  -I checked SPEP and light chain level  today, result is pending  -This is likely anemia of chronic disease, secondary to her CKD -Hemoglobin 9.6, overall stable, she is not symptomatic, we'll continue monitoring. -We discussed the option of Aranesp for her anemia of chronic disease, the potential benefit and the side effects were discussed with her. She is not really interested for now.   3. Bone health:  -She had a bone density scan 2 year ago which showed osteopenia, I recommend her to have her repeated in one next month -Anastrozole can cause osteoporosis or worsening osteopenia. -I encouraged her to continue taking calcium supplementation and vitamin D. -She will contact her physician at [facility], to get a repeated bone DEXA scan.  4. Severe COPD, on home oxygen 3L/min -She is followed with her pulmonologist Dr. Lake Bells.  5. CKD. -Her creatinine is 1.3 today, stable  -She will continue follow-up with her primary care physician  Plan -Mammogram and DEXA scan in August 2017 -continue anastrozole, for a total of 5 years -return to clinic in 4 months with lab CBC, CMP and SPEP/IFE.   All questions were answered. The patient knows to call the clinic with any problems, questions or concerns. No barriers to learning was detected.  I spent 20 minutes counseling the patient face to face. The total time spent in the appointment was 30 minutes and more than 50% was on counseling and review of test results     Truitt Merle, MD 07/09/2015 8:01 AM

## 2015-07-10 LAB — KAPPA/LAMBDA LIGHT CHAINS
IG KAPPA FREE LIGHT CHAIN: 40.68 mg/L — AB (ref 3.30–19.40)
Ig Lambda Free Light Chain: 28.38 mg/L — ABNORMAL HIGH (ref 5.71–26.30)
Kappa/Lambda FluidC Ratio: 1.43 (ref 0.26–1.65)

## 2015-07-10 LAB — PROTEIN ELECTROPHORESIS, SERUM, WITH REFLEX
A/G Ratio: 1.1 (ref 0.7–1.7)
ALPHA 1: 0.3 g/dL (ref 0.0–0.4)
Albumin: 3.4 g/dL (ref 2.9–4.4)
Alpha 2: 0.8 g/dL (ref 0.4–1.0)
Beta: 1 g/dL (ref 0.7–1.3)
GLOBULIN, TOTAL: 3 g/dL (ref 2.2–3.9)
Gamma Globulin: 0.9 g/dL (ref 0.4–1.8)
TOTAL PROTEIN: 6.4 g/dL (ref 6.0–8.5)

## 2015-09-12 ENCOUNTER — Other Ambulatory Visit: Payer: Self-pay | Admitting: Cardiology

## 2015-09-14 NOTE — Telephone Encounter (Signed)
Rx request sent to pharmacy.  

## 2015-09-15 ENCOUNTER — Ambulatory Visit (INDEPENDENT_AMBULATORY_CARE_PROVIDER_SITE_OTHER): Payer: Medicare Other | Admitting: Pulmonary Disease

## 2015-09-15 ENCOUNTER — Encounter: Payer: Self-pay | Admitting: Pulmonary Disease

## 2015-09-15 DIAGNOSIS — J432 Centrilobular emphysema: Secondary | ICD-10-CM | POA: Diagnosis not present

## 2015-09-15 DIAGNOSIS — J9611 Chronic respiratory failure with hypoxia: Secondary | ICD-10-CM | POA: Diagnosis not present

## 2015-09-15 MED ORDER — BUDESONIDE 180 MCG/ACT IN AEPB: 2.0000 | INHALATION_SPRAY | RESPIRATORY_TRACT | 0 refills | Status: DC

## 2015-09-15 NOTE — Progress Notes (Signed)
Subjective:    Patient ID: Courtney Cline, female    DOB: 01-31-1934, 82 y.o.   MRN: [id number]  Synopsis: GOLD Grade D COPD 2015 FEV1 33% pred, on 2L O2 continuously  HPI  Chief Complaint  Patient presents with  . Follow-up    pt has good and bad days.  Bad days include SOB, prod cough with clear/yellow mucus.     In April she needed to see [place] for dyspnea and was treated with a steroid injection for a COPD exacerbation which helped. She says that some days she does well, but other days she has more dyspnea. She says that last night she had dyspnea with minimal activity like taking a shower. She has been wheezing more lately, not any more congested than baseline.  She notes significant wheezing and allergy symptoms with sinus congestion and a runny nose.   She still takes stiolto daily which helps.  She is not exercising regularly.   Past Medical History:  Diagnosis Date  . Allergic rhinitis   . Arthritis   . Breast calcification seen on mammogram    Right breast  . COPD (chronic obstructive pulmonary disease) ([place])   . GERD (gastroesophageal reflux disease)   . Hyperglycemia   . Hyperlipidemia   . Hypertension   . Low back pain   . On home oxygen therapy    all the time  . Osteopenia   . Peripheral edema   . Shortness of breath   . Subclavian artery stenosis, left   . Vitamin D deficiency   . Wears dentures    top  . Wears glasses    reading     Review of Systems     Objective:   Physical Exam Vitals:   09/15/15 1209  BP: (!) 142/66  BP Location: Right Arm  Cuff Size: Normal  Pulse: 88  SpO2: 97%  Weight: 185 lb (83.9 kg)  Height: 5\' 1"  (1.549 m)   3L O2  Gen:,chronically ill appearing, no acute distress HEENT: NCAT, EOMi, OP clear, PULM: Poor air movement, no wheezing, some crackles bases CV: RRR, no mgr, no JVD GI: BS+, soft, nontender,  Ext: warm, trace edema, no clubbing, no cyanosis Derm: no rash or skin breakdown Neuro: A&Ox4,  MAEW  April 2017 chest x-ray images personally reviewed showing emphysema but no other scarring or mass      Assessment & Plan:   COPD (chronic obstructive pulmonary disease) ([place]) She has very severe airflow obstruction with significant emphysema on chest x-ray. This is the primary cause for her shortness of breath. She had one exacerbation since the last visit but none since. Neuromuscular weakness from deconditioning contributes significantly to her sensation of dyspnea as well. Unfortunately she's not exercising nor can she afford pulmonary rehabilitation.  I wonder whether or not she may have a component of an asthmatic overlap syndrome as she notes significant sneezing and allergic rhinitis symptoms.  Plan: Trial of additional inhaled corticosteroid, samples were provided today of Pulmicort 180 g, use 2 puffs each morning Continue Stiolto 2 puffs each morning Exercise regularly, she was counseled on this at length and given specific exercises to try Follow-up in 6 weeks to see if the Pulmicort has been effective  Chronic hypoxemic respiratory failure (HCC) Continue using 3 L of oxygen continuously   Updated Medication List Outpatient Encounter Prescriptions as of 09/15/2015  Medication Sig  . acetaminophen (TYLENOL) 325 MG tablet Take 650 mg by mouth every 6 (six) hours as  needed.  Marland Kitchen albuterol (PROVENTIL HFA;VENTOLIN HFA) 108 (90 BASE) MCG/ACT inhaler Inhale 2 puffs into the lungs every 6 (six) hours as needed for wheezing or shortness of breath.  . anastrozole (ARIMIDEX) 1 MG tablet TAKE 1 TABLET (1 MG TOTAL) BY MOUTH DAILY.  Marland Kitchen aspirin 81 MG tablet Take 81 mg by mouth daily.  . Cholecalciferol (VITAMIN D) 2000 UNITS CAPS Take 1 capsule by mouth daily.  . Cyanocobalamin (VITAMIN B 12 PO) Take 1 tablet by mouth daily.  . DiphenhydrAMINE HCl (BENADRYL PO) Take 1 tablet by mouth as needed.  . furosemide (LASIX) 40 MG tablet TAKE 1 TABLET (40 MG TOTAL) BY MOUTH 2 (TWO) TIMES DAILY.    Marland Kitchen KLOR-CON M20 20 MEQ tablet TAKE 1 TABLET BY MOUTH DAILY  . lisinopril (PRINIVIL,ZESTRIL) 40 MG tablet Take 40 mg by mouth daily.  Marland Kitchen lovastatin (MEVACOR) 40 MG tablet Take 40 mg by mouth at bedtime.  Marland Kitchen omeprazole (PRILOSEC) 20 MG capsule Take 20 mg by mouth daily.  Marland Kitchen STIOLTO RESPIMAT 2.5-2.5 MCG/ACT AERS INHALE 2 PUFFS INTO THE LUNGS DAILY.  . budesonide (PULMICORT) 180 MCG/ACT inhaler Inhale 2 puffs into the lungs every morning.  . [DISCONTINUED] albuterol (PROVENTIL) (2.5 MG/3ML) 0.083% nebulizer solution Take 3 mLs (2.5 mg total) by nebulization every 6 (six) hours. For wheezing or shortness of breath  . [DISCONTINUED] furosemide (LASIX) 40 MG tablet Take 40 mg by mouth daily.    No facility-administered encounter medications on file as of 09/15/2015.

## 2015-09-15 NOTE — Patient Instructions (Signed)
Take Pulmicort 2 puffs in the morning, no matter how you feel Keep taking Stiolto as you're doing Keep using your oxygen as you're doing Exercise regularly Follow-up in 6 weeks

## 2015-09-15 NOTE — Assessment & Plan Note (Signed)
She has very severe airflow obstruction with significant emphysema on chest x-ray. This is the primary cause for her shortness of breath. She had one exacerbation since the last visit but none since. Neuromuscular weakness from deconditioning contributes significantly to her sensation of dyspnea as well. Unfortunately she's not exercising nor can she afford pulmonary rehabilitation.  I wonder whether or not she may have a component of an asthmatic overlap syndrome as she notes significant sneezing and allergic rhinitis symptoms.  Plan: Trial of additional inhaled corticosteroid, samples were provided today of Pulmicort 180 g, use 2 puffs each morning Continue Stiolto 2 puffs each morning Exercise regularly, she was counseled on this at length and given specific exercises to try Follow-up in 6 weeks to see if the Pulmicort has been effective

## 2015-09-15 NOTE — Assessment & Plan Note (Signed)
Continue using 3 L of oxygen continuously

## 2015-10-15 ENCOUNTER — Ambulatory Visit
Admission: RE | Admit: 2015-10-15 | Discharge: 2015-10-15 | Disposition: A | Discharge status: Home / Self Care | Payer: Medicare Other | Source: Ambulatory Visit | Attending: Hematology | Admitting: Hematology

## 2015-10-15 ENCOUNTER — Ambulatory Visit
Admission: RE | Admit: 2015-10-15 | Discharge: 2015-10-15 | Disposition: A | Discharge status: Home / Self Care | Payer: Medicare Other | Source: Ambulatory Visit | Attending: Family Medicine | Admitting: Family Medicine

## 2015-10-15 DIAGNOSIS — C50211 Malignant neoplasm of upper-inner quadrant of right female breast: Secondary | ICD-10-CM

## 2015-10-15 DIAGNOSIS — E2839 Other primary ovarian failure: Secondary | ICD-10-CM

## 2015-10-15 DIAGNOSIS — Z853 Personal history of malignant neoplasm of breast: Secondary | ICD-10-CM

## 2015-10-27 ENCOUNTER — Ambulatory Visit: Payer: Medicare Other | Admitting: Pulmonary Disease

## 2015-11-05 ENCOUNTER — Other Ambulatory Visit (HOSPITAL_BASED_OUTPATIENT_CLINIC_OR_DEPARTMENT_OTHER): Payer: Medicare Other

## 2015-11-05 ENCOUNTER — Telehealth: Payer: Self-pay | Admitting: Hematology

## 2015-11-05 ENCOUNTER — Encounter: Payer: Self-pay | Admitting: Hematology

## 2015-11-05 ENCOUNTER — Ambulatory Visit (HOSPITAL_BASED_OUTPATIENT_CLINIC_OR_DEPARTMENT_OTHER): Payer: Medicare Other | Admitting: Hematology

## 2015-11-05 VITALS — BP 146/56 | HR 105 | Temp 98.4°F | Resp 18 | Ht 61.0 in | Wt 185.4 lb

## 2015-11-05 DIAGNOSIS — J432 Centrilobular emphysema: Secondary | ICD-10-CM

## 2015-11-05 DIAGNOSIS — D631 Anemia in chronic kidney disease: Secondary | ICD-10-CM | POA: Diagnosis not present

## 2015-11-05 DIAGNOSIS — D638 Anemia in other chronic diseases classified elsewhere: Secondary | ICD-10-CM

## 2015-11-05 DIAGNOSIS — N183 Chronic kidney disease, stage 3 (moderate): Secondary | ICD-10-CM

## 2015-11-05 DIAGNOSIS — J449 Chronic obstructive pulmonary disease, unspecified: Secondary | ICD-10-CM

## 2015-11-05 DIAGNOSIS — C50211 Malignant neoplasm of upper-inner quadrant of right female breast: Secondary | ICD-10-CM

## 2015-11-05 DIAGNOSIS — Z79811 Long term (current) use of aromatase inhibitors: Secondary | ICD-10-CM

## 2015-11-05 DIAGNOSIS — M8580 Other specified disorders of bone density and structure, unspecified site: Secondary | ICD-10-CM

## 2015-11-05 DIAGNOSIS — I10 Essential (primary) hypertension: Secondary | ICD-10-CM | POA: Diagnosis not present

## 2015-11-05 DIAGNOSIS — Z170 Estrogen receptor positive status [ER+]: Secondary | ICD-10-CM

## 2015-11-05 LAB — COMPREHENSIVE METABOLIC PANEL
ALBUMIN: 3.4 g/dL — AB (ref 3.5–5.0)
AST: 12 U/L (ref 5–34)
Alkaline Phosphatase: 74 U/L (ref 40–150)
Anion Gap: 10 mEq/L (ref 3–11)
BUN: 21.2 mg/dL (ref 7.0–26.0)
CALCIUM: 9.4 mg/dL (ref 8.4–10.4)
CHLORIDE: 103 meq/L (ref 98–109)
CO2: 30 mEq/L — ABNORMAL HIGH (ref 22–29)
CREATININE: 1.2 mg/dL — AB (ref 0.6–1.1)
EGFR: 43 mL/min/{1.73_m2} — ABNORMAL LOW (ref >90)
GLUCOSE: 142 mg/dL — AB (ref 70–140)
Potassium: 4.2 mEq/L (ref 3.5–5.1)
SODIUM: 143 meq/L (ref 136–145)
Total Bilirubin: <0.3 mg/dL (ref 0.20–1.20)
Total Protein: 7.1 g/dL (ref 6.4–8.3)

## 2015-11-05 LAB — CBC & DIFF AND RETIC
BASO%: 0.3 % (ref 0.0–2.0)
BASOS ABS: 0 10*3/uL (ref 0.0–0.1)
EOS ABS: 0.3 10*3/uL (ref 0.0–0.5)
EOS%: 4.5 % (ref 0.0–7.0)
HEMATOCRIT: 31.6 % — AB (ref 34.8–46.6)
HEMOGLOBIN: 9.9 g/dL — AB (ref 11.6–15.9)
Immature Retic Fract: 5.1 % (ref 1.60–10.00)
LYMPH#: 1.4 10*3/uL (ref 0.9–3.3)
LYMPH%: 22.9 % (ref 14.0–49.7)
MCH: 29.6 pg (ref 25.1–34.0)
MCHC: 31.3 g/dL — AB (ref 31.5–36.0)
MCV: 94.3 fL (ref 79.5–101.0)
MONO#: 0.5 10*3/uL (ref 0.1–0.9)
MONO%: 7.5 % (ref 0.0–14.0)
NEUT#: 4 10*3/uL (ref 1.5–6.5)
NEUT%: 64.8 % (ref 38.4–76.8)
Platelets: 211 10*3/uL (ref 145–400)
RBC: 3.35 10*6/uL — ABNORMAL LOW (ref 3.70–5.45)
RDW: 13.5 % (ref 11.2–14.5)
RETIC %: 1.33 % (ref 0.70–2.10)
RETIC CT ABS: 44.56 10*3/uL (ref 33.70–90.70)
WBC: 6.2 10*3/uL (ref 3.9–10.3)

## 2015-11-05 NOTE — Progress Notes (Signed)
[place]  Telephone:([phone]) [phone] Fax:([phone]) [phone]  Clinic Follow up Note   Patient Care Team: Antony Blackbird, MD as PCP - General (Family Medicine) Juanito Doom, MD as Consulting Physician (Pulmonary Disease) Truitt Merle, MD as Consulting Physician (Hematology) Erroll Luna, MD as Consulting Physician (General Surgery) 11/05/2015  CHIEF COMPLAIN: Follow up breast cancer   Oncology History   Breast cancer of upper-inner quadrant of right female breast   Staging form: Breast, AJCC 7th Edition     Clinical: Stage Unknown (T1c, NX, cM0) - Signed by Glean Salvo, MD on 11/19/2013       Staging comments: ER 100%+, PR 100%+, Ki-67 7%        Breast cancer of upper-inner quadrant of right female breast ([place])   12/06/2012 Imaging    Bone Density performed at [facility] physicians T score Lumbar spine -0.1 (-0.7 in 2012), Right neck femur -1.4 (-1.2 before), Left neck femur -1.4 (-1.5 before) and Interpretation is OSTEOPENIA by WHO criteria.       10/11/2013 Mammogram    Abnormal Screening mammogram showing Right Breast mass.      10/23/2013 Breast US    Diagnostic mammogram and US showed an irregular hypoechoic mass at 9'0 clock position right breast 8 cm from nipple. [place] measurement 1.2x0.7x1.1 cm, no axillary adenopathy.      10/23/2013 Initial Biopsy    US guided biopsy  done with clip placement.      10/23/2013 Pathology Results    Invasive ductal carcinoma (IDC), DCIS, invasive cancer grade 2, ER 100%+, PR 100%+, Ki-67% 7%, HER-2/NEU by CISH No amplification, ratio of her2:cep17 1.00, average her2 copy number per cell 1.95 (HER 2 Negative tumor). Molecular Classification LUMINAL A.      11/01/2013 Surgery    Initial surgical evaluation by Dr Marcello Moores Cornett: "Not good surgical candidate because of pulmonary status". Recommended anti-estrogen therapy.      11/18/2013 -  Anti-estrogen oral therapy    Anastrozole 1 mg once daily      11/18/2013 -   Neo-Adjuvant Anti-estrogen oral therapy    Patient started on Arimidex today after initial evaluation (Dr Lona Kettle). Also discussed with Dr Lake Bells and Dr Brantley Stage to reconsider surgery after optimization of her pulmonary status, perhaps with Local anesthesia.      12/20/2013 Surgery    right breast lumpectomy without SLN biopsy, negative margins       12/20/2013 Pathologic Stage    pT1cNxMx, G2, LVI (-), tumor measures 1.2cm. Surgical margins are negative.         CURRENT THERAPY: Anastrozole 60m daily, started on 11/18/2013  INTERVAL HISTORY: She returns for follow up.  She is doing well overall. She is compliant with azatrozole, mild hot flush, manageable, No other significant side effects. She has had some allergy symptoms, with nasal congestion and sneezing, no fever, cough or chest discomfort. She is on home oxygen continuously for her COPD. She is not very active, but able to function at home. She came to the clinic with her husband today. The weight is stable.  REVIEW OF SYSTEMS:   Constitutional: Denies fevers, chills or abnormal weight loss Eyes: Denies blurriness of vision Ears, nose, mouth, throat, and face: Denies mucositis or sore throat Respiratory: (+) chronic cough and dyspnea, no wheezes Cardiovascular: Denies palpitation, chest discomfort or lower extremity swelling Gastrointestinal:  Denies nausea, heartburn or change in bowel habits Skin: Denies abnormal skin rashes Lymphatics: Denies new lymphadenopathy or easy bruising Neurological:Denies numbness, tingling or new  weaknesses Behavioral/Psych: Mood is stable, no new changes  All other systems were reviewed with the patient and are negative.  MEDICAL HISTORY:  Past Medical History:  Diagnosis Date  . Allergic rhinitis   . Arthritis   . Breast calcification seen on mammogram    Right breast  . COPD (chronic obstructive pulmonary disease) ([place])   . GERD (gastroesophageal reflux disease)   . Hyperglycemia   .  Hyperlipidemia   . Hypertension   . Low back pain   . On home oxygen therapy    all the time  . Osteopenia   . Peripheral edema   . Shortness of breath   . Subclavian artery stenosis, left   . Vitamin D deficiency   . Wears dentures    top  . Wears glasses    reading    SURGICAL HISTORY: Past Surgical History:  Procedure Laterality Date  . BREAST LUMPECTOMY WITH RADIOACTIVE SEED LOCALIZATION Right 12/20/2013   Procedure: RIGHT BREAST LUMPECTOMY WITH RADIOACTIVE SEED LOCALIZATION;  Surgeon: Erroll Luna, MD;  Location: [place];  Service: General;  Laterality: Right;  . CAROTID DUPLEX  2014  . CATARACT EXTRACTION     both eyes    I have reviewed the social history and family history with the patient and they are unchanged from previous note.  ALLERGIES:  has No Known Allergies.  MEDICATIONS:  Current Outpatient Prescriptions  Medication Sig Dispense Refill  . acetaminophen (TYLENOL) 325 MG tablet Take 650 mg by mouth every 6 (six) hours as needed.    Marland Kitchen albuterol (PROVENTIL HFA;VENTOLIN HFA) 108 (90 BASE) MCG/ACT inhaler Inhale 2 puffs into the lungs every 6 (six) hours as needed for wheezing or shortness of breath.    . anastrozole (ARIMIDEX) 1 MG tablet TAKE 1 TABLET (1 MG TOTAL) BY MOUTH DAILY. 90 tablet 3  . aspirin 81 MG tablet Take 81 mg by mouth daily.    . Cholecalciferol (VITAMIN D) 2000 UNITS CAPS Take 1 capsule by mouth daily.    . Cyanocobalamin (VITAMIN B 12 PO) Take 1 tablet by mouth daily.    . DiphenhydrAMINE HCl (BENADRYL PO) Take 1 tablet by mouth as needed.    . furosemide (LASIX) 40 MG tablet TAKE 1 TABLET (40 MG TOTAL) BY MOUTH 2 (TWO) TIMES DAILY. 180 tablet 2  . KLOR-CON M20 20 MEQ tablet TAKE 1 TABLET BY MOUTH DAILY 90 tablet 2  . lisinopril (PRINIVIL,ZESTRIL) 40 MG tablet Take 40 mg by mouth daily.    Marland Kitchen lovastatin (MEVACOR) 40 MG tablet Take 40 mg by mouth at bedtime.    Marland Kitchen omeprazole (PRILOSEC) 20 MG capsule Take 20 mg by mouth  daily.    Marland Kitchen STIOLTO RESPIMAT 2.5-2.5 MCG/ACT AERS INHALE 2 PUFFS INTO THE LUNGS DAILY. 4 g 5   No current facility-administered medications for this visit.     PHYSICAL EXAMINATION: ECOG PERFORMANCE STATUS: 2 - Symptomatic, <50% confined to bed  Vitals:   11/05/15 1248  BP: (!) 146/56  Pulse: (!) 105  Resp: 18  Temp: 98.4 F (36.9 C)   Filed Weights   11/05/15 1248  Weight: 185 lb 6.4 oz (84.1 kg)    GENERAL:alert, no distress and comfortable SKIN: skin color, texture, turgor are normal, no rashes or significant lesions EYES: normal, Conjunctiva are pink and non-injected, sclera clear OROPHARYNX:no exudate, no erythema and lips, buccal mucosa, and tongue normal  NECK: supple, thyroid normal size, non-tender, without nodularity LYMPH:  no palpable lymphadenopathy in the cervical,  axillary or inguinal LUNGS: clear to auscultation and percussion with normal breathing effort HEART: regular rate & rhythm and no murmurs and no lower extremity edema ABDOMEN:abdomen soft, non-tender and normal bowel sounds Musculoskeletal:no cyanosis of digits and no clubbing  NEURO: alert & oriented x 3 with fluent speech, no focal motor/sensory deficits Breasts: Breast inspection showed them to be symmetrical with no nipple discharge. Palpation of the breasts and axilla revealed no obvious mass that I could appreciate.   LABORATORY DATA:  I have reviewed the data as listed CBC Latest Ref Rng & Units 11/05/2015 07/09/2015 03/10/2015  WBC 3.9 - 10.3 10e3/uL 6.2 5.7 5.8  Hemoglobin 11.6 - 15.9 g/dL 9.9(L) 9.3(L) 9.6(L)  Hematocrit 34.8 - 46.6 % 31.6(L) 29.6(L) 30.7(L)  Platelets 145 - 400 10e3/uL 211 169 209     CMP Latest Ref Rng & Units 11/05/2015 07/09/2015 07/09/2015  Glucose 70 - 140 mg/dl 142(H) 118 -  BUN 7.0 - 26.0 mg/dL 21.2 26.1(H) -  Creatinine 0.6 - 1.1 mg/dL 1.2(H) 1.3(H) -  Sodium 136 - 145 mEq/L 143 143 -  Potassium 3.5 - 5.1 mEq/L 4.2 4.5 -  Chloride 96 - 112 mEq/L - - -  CO2 22  - 29 mEq/L 30(H) 28 -  Calcium 8.4 - 10.4 mg/dL 9.4 9.0 -  Total Protein 6.4 - 8.3 g/dL 7.1 6.8 6.4  Total Bilirubin 0.20 - 1.20 mg/dL <0.30 <0.30 -  Alkaline Phos 40 - 150 U/L 74 66 -  AST 5 - 34 U/L 12 10 -  ALT 0 - 55 U/L <9 <9 -   PATHOLOGY REPORT  12/20/2013 Diagnosis 1. Breast, lumpectomy, Right - INVASIVE DUCTAL CARCINOMA, GRADE 2, SPANNING 1.2 CM. - DUCTAL CARCINOMA IN SITU. - BIOPSY SITE. - FINAL RESECTION MARGINS ARE NEGATIVE. - SEE ONCOLOGY TABLE. 2. Breast, excision, Right additional deep margin - BENIGN FIBROADIPOSE TISSUE. - NO MALIGNANCY IDENTIFIED. 3. Breast, excision, Right additional lateral margin - BENIGN BREAST TISSUE WITH FIBROCYSTIC CHANGE. - NO MALIGNANCY IDENTIFIED. 1 of 3 FINAL for MARBELLA, MARKGRAF A [phone]) Microscopic Comment 1. BREAST, INVASIVE TUMOR, WITH LYMPH NODES PRESENT Specimen, including laterality and lymph node sampling (sentinel, non-sentinel): Right breast lump. Procedure: Right breast lumpectomy. Histologic type: Invasive ductal carcinoma. Grade: 2 Tubule formation: 3 Nuclear pleomorphism: 2 Mitotic:1 Tumor size (gross measurement): 1.2 cm Margins: Invasive, distance to closest margin: Present at cauterized deep margin of original specimen. Additional deep margin (part #2) is negative. In-situ, distance to closest margin: Present at cauterized deep margin of original specimen. Additional deep margin (part #2) is negative. If margin positive, focally or broadly: Original is focal. Final is negative. Lymphovascular invasion: Absent. Ductal carcinoma in situ: Present. Grade: II Extensive intraductal component: No. Lobular neoplasia: Absent. Tumor focality: Unifocal. Treatment effect: [name]. Extent of tumor: Confined to breast parenchyma. Lymph nodes: Not sampled. Examined: 0 Sentinel 0 Non-sentinel 0 Total Lymph nodes with metastasis: 0 Isolated tumor cells (< 0.2 mm): 0 Micrometastasis: (> 0.2 mm and < 2.0 mm):  0 Macrometastasis: (> 2.0 mm): 0 Extracapsular extension: 0 Breast prognostic profile: Performed on [id number] (results below). Her 2 neu will be repeated on current specimen. Estrogen receptor: Positive, strong intensity. Progesterone receptor: Positive, strong intensity. Her 2 neu: Not amplified. Ki-67: 7%    RADIOGRAPHIC STUDIES: I have personally reviewed the radiological images as listed and agreed with the findings in the report.  MM Diag breast tomo bilateral 10/15/2015 IMPRESSION: No mammographic evidence of malignancy  RECOMMENDATION: Annual diagnostic mammography  DEX 10/15/2015 ASSESSMENT: The  BMD measured at Femur Neck Right is 0.802 g/cm2 with a T-score of -1.7.  This patient is considered OSTEOPENIC according to [facility]) criteria.   FRAX* 10-year Probability of Fracture Based on femoral neck BMD: DualFemur (Right)  Major Osteoporotic Fracture: 13.3% Hip Fracture:                3.3% Population:                  [place] (Caucasian) Risk Factors:                Secondary Osteoporosis  ASSESSMENT & PLAN:  82 year old female with past medical history significant for COPD, on continuous 3 L/m oxygen, who was diagnosed with T1 cN0 M0, stage I a right breast invasive ductal carcinoma, status post lumpectomy.  1. Right breast invasive ductal carcinoma, T1 cN0 M0, stage Ia, G2, ER 100% positive, PR 100% positive, HER-2 negative. -She initially was thought not a surgical candidate, but did tolerate lumpectomy with local anesthesia well. Her surgical margin was negative. -I recommend her continue adjuvant aromatase inhibitor anastrozole for total 5 years, to reduce the risks of cancer recurrence in the future. -Giving her advanced age and severe comorbidity, and early stage breast cancer, I do not recommend adjuvant chemotherapy. Giving the strong ER/PR positive, low Ki-67, negative HER-2/neu, I think that her recurrence score would be low even  we did Oncotype. -Her case was presented in our breast tumor board before and adjuvant RT was not recommended. -She tolerating anastrozole well, we'll continue. -She is clinically doing well, examined unremarkable, her last mammogram in August 2017 was normal. Her lab reviewed, which are unremarkable except stable anemia, no evidence of recurrence. -We'll continue surveillance, I encouraged her to do self exam. I encouraged her to be physically active.  2. Anemia of chronic disease  -She has mild normocytic anemia, which has not recovered since the surgery.  -Her anemia workup including folic acid, Y86, ferritin and iron level were normal.  -SPEP was negative -This is likely anemia of chronic disease, secondary to her CKD -Hemoglobin 9.9, overall stable, she is not symptomatic, we'll continue monitoring.  3. Osteopenia  -Her repeated bone density scan showed osteopenia, high risk for fracture (10 year risk of hip fracture 3.3%) -Anastrozole can cause osteoporosis or worsening osteopenia. -She will continue vitamin D 2000 units daily. She has difficulty take large pill and is not taking calcium, I encouraged her to try chewable calcium pill -We discussed the benefit of biphosphonate and or Prolia, due to her high-risk fracture. She is not interested. -I encouraged her to exercise, such as bike or weight bearing exercise   4. Severe COPD, on home oxygen 3L/min -She is followed with her pulmonologist Dr. Lake Bells.  5. CKD. -Her creatinine is 1.3 today, stable  -She will continue follow-up with her primary care physician  Plan -continue anastrozole, for a total of 5 years -return to clinic in 6 months with lab CBC, CMP    All questions were answered. The patient knows to call the clinic with any problems, questions or concerns. No barriers to learning was detected.  I spent 20 minutes counseling the patient face to face. The total time spent in the appointment was 30 minutes and more  than 50% was on counseling and review of test results     Truitt Merle, MD 11/05/15 1:26 PM

## 2015-11-05 NOTE — Telephone Encounter (Signed)
Gave patient avs report and appointments for March  °

## 2015-11-06 LAB — VITAMIN D 25 HYDROXY (VIT D DEFICIENCY, FRACTURES): Vitamin D, 25-Hydroxy: 27.7 ng/mL — ABNORMAL LOW (ref 30.0–100.0)

## 2015-11-16 ENCOUNTER — Other Ambulatory Visit: Payer: Self-pay

## 2015-11-16 ENCOUNTER — Telehealth: Payer: Self-pay | Admitting: *Deleted

## 2015-11-16 MED ORDER — TIOTROPIUM BROMIDE-OLODATEROL 2.5-2.5 MCG/ACT IN AERS: 2.0000 | INHALATION_SPRAY | Freq: Every day | RESPIRATORY_TRACT | 3 refills | Status: DC

## 2015-11-16 NOTE — Telephone Encounter (Signed)
Spoke with patient.  Let her know that her Vitamin D levels are slightly low and Dr. Burr Medico wants her to increase her Vit. D from 2000 to 3000u daily.   Also encouraged her to take her chewable calcium which she says she is taking.

## 2015-11-23 ENCOUNTER — Ambulatory Visit (INDEPENDENT_AMBULATORY_CARE_PROVIDER_SITE_OTHER): Payer: Medicare Other | Admitting: Pulmonary Disease

## 2015-11-23 ENCOUNTER — Encounter: Payer: Self-pay | Admitting: Pulmonary Disease

## 2015-11-23 DIAGNOSIS — J9611 Chronic respiratory failure with hypoxia: Secondary | ICD-10-CM

## 2015-11-23 DIAGNOSIS — J411 Mucopurulent chronic bronchitis: Secondary | ICD-10-CM | POA: Diagnosis not present

## 2015-11-23 DIAGNOSIS — J301 Allergic rhinitis due to pollen: Secondary | ICD-10-CM

## 2015-11-23 DIAGNOSIS — J309 Allergic rhinitis, unspecified: Secondary | ICD-10-CM | POA: Insufficient documentation

## 2015-11-23 NOTE — Assessment & Plan Note (Signed)
This has been more of a problem for her recently.  Start nasal steroid over-the-counter, I gave her the names of all the over-the-counter medicines and recommended that she use any of them. We talked briefly about the basic pharmacokinetics and appropriate use of these medicines.  If no improvement with nasal steroid then we can consider an allergy referral.

## 2015-11-23 NOTE — Assessment & Plan Note (Signed)
Continue 3 L of oxygen continuously 

## 2015-11-23 NOTE — Progress Notes (Signed)
Subjective:    Patient ID: Courtney Cline, female    DOB: 14-Oct-1933, 82 y.o.   MRN: [id number]  Synopsis: GOLD Grade D COPD 2015 FEV1 33% pred, on 2L O2 continuously  HPI  Chief Complaint  Patient presents with  . Follow-up    pt c/o increased sneezing, runny nose, sob with exertion, prod cough with clear mucus.  pt states seasonal allergies are worsening this.     [name] notes that she continues to sneeze a lot and has been experiencing a lot of sinus drainage.  Her breathing hasn't been as good lately with more chest congestion and cough.  Not severe cough.  She is using her oxygen and Stiolto.  No exacerbation of COPD since the last visit.  Had flu shot last week.  Past Medical History:  Diagnosis Date  . Allergic rhinitis   . Arthritis   . Breast calcification seen on mammogram    Right breast  . COPD (chronic obstructive pulmonary disease) ([place])   . GERD (gastroesophageal reflux disease)   . Hyperglycemia   . Hyperlipidemia   . Hypertension   . Low back pain   . On home oxygen therapy    all the time  . Osteopenia   . Peripheral edema   . Shortness of breath   . Subclavian artery stenosis, left ([place])   . Vitamin D deficiency   . Wears dentures    top  . Wears glasses    reading     Review of Systems     Objective:   Physical Exam Vitals:   11/23/15 1203  BP: 128/64  BP Location: Right Arm  Cuff Size: Normal  Pulse: (!) 112  SpO2: 97%  Weight: 183 lb 6.4 oz (83.2 kg)  Height: 5' 0.75" (1.543 m)   3L O2  Gen:,chronically ill appearing, no acute distress HEENT: NCAT, EOMi, OP clear, PULM: Poor air movement, no wheezing, some crackles bases CV: RRR, no mgr, no JVD GI: BS+, soft, nontender,  Ext: warm, trace edema, no clubbing, no cyanosis Derm: no rash or skin breakdown Neuro: A&Ox4, MAEW       Assessment & Plan:   COPD (chronic obstructive pulmonary disease) (HCC) This has been a stable interval for her despite her severe  COPD.  Plan: Continue Stiolto Flu shot is up-to-date Follow-up 4 months  Chronic hypoxemic respiratory failure (HCC) Continue 3 L of oxygen continuously.  Allergic rhinitis This has been more of a problem for her recently.  Start nasal steroid over-the-counter, I gave her the names of all the over-the-counter medicines and recommended that she use any of them. We talked briefly about the basic pharmacokinetics and appropriate use of these medicines.  If no improvement with nasal steroid then we can consider an allergy referral.   Updated Medication List Outpatient Encounter Prescriptions as of 11/23/2015  Medication Sig  . acetaminophen (TYLENOL) 325 MG tablet Take 650 mg by mouth every 6 (six) hours as needed.  Marland Kitchen albuterol (PROVENTIL HFA;VENTOLIN HFA) 108 (90 BASE) MCG/ACT inhaler Inhale 2 puffs into the lungs every 6 (six) hours as needed for wheezing or shortness of breath.  . anastrozole (ARIMIDEX) 1 MG tablet TAKE 1 TABLET (1 MG TOTAL) BY MOUTH DAILY.  Marland Kitchen aspirin 81 MG tablet Take 81 mg by mouth daily.  . Cholecalciferol (VITAMIN D) 2000 UNITS CAPS Take 1 capsule by mouth daily.  . Cyanocobalamin (VITAMIN B 12 PO) Take 1 tablet by mouth daily.  . DiphenhydrAMINE HCl (BENADRYL PO)  Take 1 tablet by mouth as needed.  . furosemide (LASIX) 40 MG tablet TAKE 1 TABLET (40 MG TOTAL) BY MOUTH 2 (TWO) TIMES DAILY.  Marland Kitchen KLOR-CON M20 20 MEQ tablet TAKE 1 TABLET BY MOUTH DAILY  . lisinopril (PRINIVIL,ZESTRIL) 40 MG tablet Take 40 mg by mouth daily.  Marland Kitchen lovastatin (MEVACOR) 40 MG tablet Take 40 mg by mouth at bedtime.  Marland Kitchen omeprazole (PRILOSEC) 20 MG capsule Take 20 mg by mouth daily.  . Tiotropium Bromide-Olodaterol (STIOLTO RESPIMAT) 2.5-2.5 MCG/ACT AERS Inhale 2 puffs into the lungs daily.   No facility-administered encounter medications on file as of 11/23/2015.

## 2015-11-23 NOTE — Assessment & Plan Note (Signed)
This has been a stable interval for her despite her severe COPD.  Plan: Continue Stiolto Flu shot is up-to-date Follow-up 4 months

## 2015-11-23 NOTE — Patient Instructions (Signed)
For your sinus congestion I recommend that you use any of the following over-the-counter medications: Fluticasone, mometasone, or triamcinolone. The brand names for these medicines are Flonase, Nasacort, Nasonex. Use them as directed. It takes about 2-3 weeks of regular use of these medicines to work  Keep taking your other medicines and using her oxygen as you are doing I will see you back in 3-4 months or sooner if needed

## 2016-01-29 ENCOUNTER — Other Ambulatory Visit: Payer: Self-pay | Admitting: Hematology

## 2016-01-29 DIAGNOSIS — Z171 Estrogen receptor negative status [ER-]: Principal | ICD-10-CM

## 2016-01-29 DIAGNOSIS — C50211 Malignant neoplasm of upper-inner quadrant of right female breast: Secondary | ICD-10-CM

## 2016-03-01 ENCOUNTER — Ambulatory Visit: Payer: Medicare Other | Admitting: Pulmonary Disease

## 2016-03-17 ENCOUNTER — Ambulatory Visit: Payer: Medicare Other | Admitting: Pulmonary Disease

## 2016-04-07 ENCOUNTER — Ambulatory Visit: Payer: Medicare Other | Admitting: Pulmonary Disease

## 2016-05-03 NOTE — Progress Notes (Signed)
[facility]  [phone] [phone]  Clinic Follow up Note   Patient Care Team: Antony Blackbird, MD as PCP - General (Family Medicine) Juanito Doom, MD as Consulting Physician (Pulmonary Disease) Truitt Merle, MD as Consulting Physician (Hematology) Erroll Luna, MD as Consulting Physician (General Surgery) 05/05/2016  CHIEF COMPLAIN: Follow up breast cancer   Oncology History   Breast cancer of upper-inner quadrant of right female breast   Staging form: Breast, AJCC 7th Edition     Clinical: Stage Unknown (T1c, NX, cM0) - Signed by Glean Salvo, MD on 11/19/2013       Staging comments: ER 100%+, PR 100%+, Ki-67 7%        Breast cancer of upper-inner quadrant of right female breast ([place])   12/06/2012 Imaging    Bone Density performed at [facility] physicians T score Lumbar spine -0.1 (-0.7 in 2012), Right neck femur -1.4 (-1.2 before), Left neck femur -1.4 (-1.5 before) and Interpretation is OSTEOPENIA by WHO criteria.       10/11/2013 Mammogram    Abnormal Screening mammogram showing Right Breast mass.      10/23/2013 Breast US    Diagnostic mammogram and US showed an irregular hypoechoic mass at 9'0 clock position right breast 8 cm from nipple. [place] measurement 1.2x0.7x1.1 cm, no axillary adenopathy.      10/23/2013 Initial Biopsy    US guided biopsy  done with clip placement.      10/23/2013 Pathology Results    Invasive ductal carcinoma (IDC), DCIS, invasive cancer grade 2, ER 100%+, PR 100%+, Ki-67% 7%, HER-2/NEU by CISH No amplification, ratio of her2:cep17 1.00, average her2 copy number per cell 1.95 (HER 2 Negative tumor). Molecular Classification LUMINAL A.      11/01/2013 Surgery    Initial surgical evaluation by Dr Marcello Moores Cornett: "Not good surgical candidate because of pulmonary status". Recommended anti-estrogen therapy.      11/18/2013 -  Anti-estrogen oral therapy    Anastrozole 1 mg once daily      11/18/2013 -   Neo-Adjuvant Anti-estrogen oral therapy    Patient started on Arimidex today after initial evaluation (Dr Lona Kettle). Also discussed with Dr Lake Bells and Dr Brantley Stage to reconsider surgery after optimization of her pulmonary status, perhaps with Local anesthesia.      12/20/2013 Surgery    right breast lumpectomy without SLN biopsy, negative margins       12/20/2013 Pathologic Stage    pT1cNxMx, G2, LVI (-), tumor measures 1.2cm. Surgical margins are negative.         CURRENT THERAPY: Anastrozole '1mg'$  daily, started on 11/18/2013  INTERVAL HISTORY: She returns for follow up.  She is doing good overall. She is on the same oxygen as before. She admits to joint or muscle soreness bilaterally. When she walks she doesn't experience it. Only when she sits. She admits to having shortness of breath when the nurses took BP earlier. She does self breast exams at home.    REVIEW OF SYSTEMS:   Constitutional: Denies fevers, chills or abnormal weight loss Eyes: Denies blurriness of vision Ears, nose, mouth, throat, and face: Denies mucositis or sore throat Respiratory:, no wheezes (+) SOB Cardiovascular: Denies palpitation, chest discomfort or lower extremity swelling Gastrointestinal:  Denies nausea, heartburn or change in bowel habits Skin: Denies abnormal skin rashes Lymphatics: Denies new lymphadenopathy or easy bruising Neurological:Denies numbness, tingling or new weaknesses Behavioral/Psych: Mood is stable, no new changes  Muscloskeletal: (+) muscle soreness bilaterally All other systems were  reviewed with the patient and are negative.  MEDICAL HISTORY:  Past Medical History:  Diagnosis Date  . Allergic rhinitis   . Arthritis   . Breast calcification seen on mammogram    Right breast  . COPD (chronic obstructive pulmonary disease) ([place])   . GERD (gastroesophageal reflux disease)   . Hyperglycemia   . Hyperlipidemia   . Hypertension   . Low back pain   . On home oxygen therapy    all  the time  . Osteopenia   . Peripheral edema   . Shortness of breath   . Subclavian artery stenosis, left ([place])   . Vitamin D deficiency   . Wears dentures    top  . Wears glasses    reading    SURGICAL HISTORY: Past Surgical History:  Procedure Laterality Date  . BREAST LUMPECTOMY WITH RADIOACTIVE SEED LOCALIZATION Right 12/20/2013   Procedure: RIGHT BREAST LUMPECTOMY WITH RADIOACTIVE SEED LOCALIZATION;  Surgeon: Erroll Luna, MD;  Location: [facility];  Service: General;  Laterality: Right;  . CAROTID DUPLEX  2014  . CATARACT EXTRACTION     both eyes    I have reviewed the social history and family history with the patient and they are unchanged from previous note.  ALLERGIES:  has No Known Allergies.  MEDICATIONS:  Current Outpatient Prescriptions  Medication Sig Dispense Refill  . acetaminophen (TYLENOL) 325 MG tablet Take 650 mg by mouth every 6 (six) hours as needed.    Marland Kitchen albuterol (PROVENTIL HFA;VENTOLIN HFA) 108 (90 BASE) MCG/ACT inhaler Inhale 2 puffs into the lungs every 6 (six) hours as needed for wheezing or shortness of breath.    . anastrozole (ARIMIDEX) 1 MG tablet TAKE 1 TABLET BY MOUTH EVERY DAY 90 tablet 3  . aspirin 81 MG tablet Take 81 mg by mouth daily.    . Cholecalciferol (VITAMIN D) 2000 UNITS CAPS Take 1 capsule by mouth daily.    . Cyanocobalamin (VITAMIN B 12 PO) Take 1 tablet by mouth daily.    . DiphenhydrAMINE HCl (BENADRYL PO) Take 1 tablet by mouth as needed.    . furosemide (LASIX) 40 MG tablet TAKE 1 TABLET (40 MG TOTAL) BY MOUTH 2 (TWO) TIMES DAILY. 180 tablet 2  . KLOR-CON M20 20 MEQ tablet TAKE 1 TABLET BY MOUTH DAILY 90 tablet 2  . lisinopril (PRINIVIL,ZESTRIL) 40 MG tablet Take 40 mg by mouth daily.    Marland Kitchen lovastatin (MEVACOR) 40 MG tablet Take 40 mg by mouth at bedtime.    Marland Kitchen omeprazole (PRILOSEC) 20 MG capsule Take 20 mg by mouth daily.    . Tiotropium Bromide-Olodaterol (STIOLTO RESPIMAT) 2.5-2.5 MCG/ACT AERS Inhale 2  puffs into the lungs daily. 3 Inhaler 3   No current facility-administered medications for this visit.     PHYSICAL EXAMINATION:  ECOG PERFORMANCE STATUS: 2 - Symptomatic, <50% confined to bed VS reviewed  GENERAL:alert, no distress and comfortable SKIN: skin color, texture, turgor are normal, no rashes or significant lesions EYES: normal, Conjunctiva are pink and non-injected, sclera clear OROPHARYNX:no exudate, no erythema and lips, buccal mucosa, and tongue normal  NECK: supple, thyroid normal size, non-tender, without nodularity LYMPH:  no palpable lymphadenopathy in the cervical, axillary or inguinal LUNGS: clear to auscultation and percussion with normal breathing effort HEART: regular rate & rhythm and no murmurs and no lower extremity edema ABDOMEN:abdomen soft, non-tender and normal bowel sounds Musculoskeletal:no cyanosis of digits and no clubbing  NEURO: alert & oriented x 3 with fluent speech,  no focal motor/sensory deficits Breasts: Breast inspection showed them to be symmetrical with no nipple discharge. Palpation of the breasts and axilla revealed no obvious mass that I could appreciate. (+) erythema under breast    LABORATORY DATA:  I have reviewed the data as listed CBC Latest Ref Rng & Units 05/05/2016 11/05/2015 07/09/2015  WBC 3.9 - 10.3 10e3/uL 6.5 6.2 5.7  Hemoglobin 11.6 - 15.9 g/dL 10.5(L) 9.9(L) 9.3(L)  Hematocrit 34.8 - 46.6 % 33.8(L) 31.6(L) 29.6(L)  Platelets 145 - 400 10e3/uL 242 211 169     CMP Latest Ref Rng & Units 05/05/2016 11/05/2015 07/09/2015  Glucose 70 - 140 mg/dl 113 142(H) 118  BUN 7.0 - 26.0 mg/dL 27.6(H) 21.2 26.1(H)  Creatinine 0.6 - 1.1 mg/dL 1.3(H) 1.2(H) 1.3(H)  Sodium 136 - 145 mEq/L 142 143 143  Potassium 3.5 - 5.1 mEq/L 4.4 4.2 4.5  Chloride 96 - 112 mEq/L - - -  CO2 22 - 29 mEq/L 31(H) 30(H) 28  Calcium 8.4 - 10.4 mg/dL 9.8 9.4 9.0  Total Protein 6.4 - 8.3 g/dL 7.6 7.1 6.8  Total Bilirubin 0.20 - 1.20 mg/dL 0.45 <0.30 <0.30    Alkaline Phos 40 - 150 U/L 83 74 66  AST 5 - 34 U/L _0 ALT 0 - 55 U/L 9 <9 <9   PATHOLOGY REPORT  12/20/2013 Diagnosis 1. Breast, lumpectomy, Right - INVASIVE DUCTAL CARCINOMA, GRADE 2, SPANNING 1.2 CM. - DUCTAL CARCINOMA IN SITU. - BIOPSY SITE. - FINAL RESECTION MARGINS ARE NEGATIVE. - SEE ONCOLOGY TABLE. 2. Breast, excision, Right additional deep margin - BENIGN FIBROADIPOSE TISSUE. - NO MALIGNANCY IDENTIFIED. 3. Breast, excision, Right additional lateral margin - BENIGN BREAST TISSUE WITH FIBROCYSTIC CHANGE. - NO MALIGNANCY IDENTIFIED. 1 of 3 FINAL for Courtney, Cline [phone]) Microscopic Comment 1. BREAST, INVASIVE TUMOR, WITH LYMPH NODES PRESENT Specimen, including laterality and lymph node sampling (sentinel, non-sentinel): Right breast lump. Procedure: Right breast lumpectomy. Histologic type: Invasive ductal carcinoma. Grade: 2 Tubule formation: 3 Nuclear pleomorphism: 2 Mitotic:1 Tumor size (gross measurement): 1.2 cm Margins: Invasive, distance to closest margin: Present at cauterized deep margin of original specimen. Additional deep margin (part #2) is negative. In-situ, distance to closest margin: Present at cauterized deep margin of original specimen. Additional deep margin (part #2) is negative. If margin positive, focally or broadly: Original is focal. Final is negative. Lymphovascular invasion: Absent. Ductal carcinoma in situ: Present. Grade: II Extensive intraductal component: No. Lobular neoplasia: Absent. Tumor focality: Unifocal. Treatment effect: [name]. Extent of tumor: Confined to breast parenchyma. Lymph nodes: Not sampled. Examined: 0 Sentinel 0 Non-sentinel 0 Total Lymph nodes with metastasis: 0 Isolated tumor cells (< 0.2 mm): 0 Micrometastasis: (> 0.2 mm and < 2.0 mm): 0 Macrometastasis: (> 2.0 mm): 0 Extracapsular extension: 0 Breast prognostic profile: Performed on [id number] (results below). Her 2 neu will be  repeated on current specimen. Estrogen receptor: Positive, strong intensity. Progesterone receptor: Positive, strong intensity. Her 2 neu: Not amplified. Ki-67: 7%    RADIOGRAPHIC STUDIES: I have personally reviewed the radiological images as listed and agreed with the findings in the report.  MM Diag breast tomo bilateral 10/15/2015 IMPRESSION: No mammographic evidence of malignancy  RECOMMENDATION: Annual diagnostic mammography  DEX 10/15/2015 ASSESSMENT: The BMD measured at Femur Neck Right is 0.802 g/cm2 with a T-score of -1.7.  This patient is considered OSTEOPENIC according to [facility]) criteria.   FRAX* 10-year Probability of Fracture Based on femoral neck BMD: DualFemur (Right)  Major Osteoporotic  Fracture: 13.3% Hip Fracture:                3.3% Population:                  [place] (Caucasian) Risk Factors:                Secondary Osteoporosis  ASSESSMENT & PLAN:  83 y.o. female with past medical history significant for COPD, on continuous 3 L/m oxygen, who was diagnosed with T1 cN0 M0, stage I a right breast invasive ductal carcinoma, status post lumpectomy.  1. Right breast invasive ductal carcinoma, T1 cN0 M0, stage Ia, G2, ER 100% positive, PR 100% positive, HER-2 negative. -She initially was thought not a surgical candidate, but did tolerate lumpectomy with local anesthesia well. Her surgical margin was negative. -I recommend her continue adjuvant aromatase inhibitor anastrozole for total 5 years, to reduce the risks of cancer recurrence in the future. -Giving her advanced age and severe comorbidity, and early stage breast cancer, I do not recommend adjuvant chemotherapy. Giving the strong ER/PR positive, low Ki-67, negative HER-2/neu, I think that her recurrence score would be low even we did Oncotype. -Her case was presented in our breast tumor board before and adjuvant RT was not recommended. -She is clinically doing well, examined  unremarkable, her last mammogram in August 2017 was normal. Her lab reviewed, which are unremarkable except stable anemia and elevated Cr, no evidence of recurrence. -We'll continue surveillance, I previously encouraged her to do self exam. Also encouraged her to be physically active. - We will continue anastrozole, she is tolerating well overall, mild hot flush, and leg soreness, manageable, I encouraged her exercise - due for mammogram in 10/2016  2. Anemia of chronic disease  -She has mild normocytic anemia, which has not recovered since the surgery.  -Her anemia workup including folic acid, P32, ferritin and iron level were normal.  -SPEP was negative -This is likely anemia of chronic disease, secondary to her CKD -Hemoglobin 10.5, overall stable, she is not symptomatic, we'll continue monitoring. - Review labs and provided print out  3. Osteopenia  -Her repeated bone density scan showed osteopenia, high risk for fracture (10 year risk of hip fracture 3.3%) -Anastrozole can cause osteoporosis or worsening osteopenia. -She will continue vitamin D 2000 units daily. She has difficulty take large pill and is not taking calcium, I encouraged her to try chewable calcium pill -We previously discussed the benefit of biphosphonate and or Prolia, due to her high-risk fracture. She is not interested. -I previously encouraged her to exercise, such as bike or weight bearing exercise  -Next bone density scan in August 2019   4. Severe COPD, on home oxygen 3L/min -She is followed with her pulmonologist Dr. Lake Bells.  5. CKD. -Her creatinine was 1.3 previously , stable  -She will continue follow-up with her primary care physician  Plan  -continue anastrozole,  -return to clinic in 6 months with lab and f/u - Mammogram in 10/2016   All questions were answered. The patient knows to call the clinic with any problems, questions or concerns. No barriers to learning was detected.  I spent 20 minutes  counseling the patient face to face. The total time spent in the appointment was 30 minutes and more than 50% was on counseling and review of test results     Truitt Merle, MD 11/05/15 11:47 PM   This document serves as a record of services personally performed by Truitt Merle, MD. It was  created on her behalf by Brandt Loosen, a trained medical scribe. The creation of this record is based on the scribe's personal observations and the provider's statements to them. This document has been checked and approved by the attending provider.

## 2016-05-05 ENCOUNTER — Telehealth: Payer: Self-pay | Admitting: Hematology

## 2016-05-05 ENCOUNTER — Ambulatory Visit (HOSPITAL_BASED_OUTPATIENT_CLINIC_OR_DEPARTMENT_OTHER): Payer: Medicare Other | Admitting: Hematology

## 2016-05-05 ENCOUNTER — Other Ambulatory Visit (HOSPITAL_BASED_OUTPATIENT_CLINIC_OR_DEPARTMENT_OTHER): Payer: Medicare Other

## 2016-05-05 DIAGNOSIS — D631 Anemia in chronic kidney disease: Secondary | ICD-10-CM | POA: Diagnosis not present

## 2016-05-05 DIAGNOSIS — C50211 Malignant neoplasm of upper-inner quadrant of right female breast: Secondary | ICD-10-CM

## 2016-05-05 DIAGNOSIS — N189 Chronic kidney disease, unspecified: Secondary | ICD-10-CM

## 2016-05-05 DIAGNOSIS — I10 Essential (primary) hypertension: Secondary | ICD-10-CM

## 2016-05-05 DIAGNOSIS — M8580 Other specified disorders of bone density and structure, unspecified site: Secondary | ICD-10-CM | POA: Diagnosis not present

## 2016-05-05 DIAGNOSIS — J411 Mucopurulent chronic bronchitis: Secondary | ICD-10-CM

## 2016-05-05 DIAGNOSIS — D638 Anemia in other chronic diseases classified elsewhere: Secondary | ICD-10-CM

## 2016-05-05 DIAGNOSIS — Z170 Estrogen receptor positive status [ER+]: Secondary | ICD-10-CM

## 2016-05-05 LAB — COMPREHENSIVE METABOLIC PANEL
ALK PHOS: 83 U/L (ref 40–150)
ALT: 9 U/L (ref 0–55)
ANION GAP: 10 meq/L (ref 3–11)
AST: 11 U/L (ref 5–34)
Albumin: 3.8 g/dL (ref 3.5–5.0)
BUN: 27.6 mg/dL — ABNORMAL HIGH (ref 7.0–26.0)
CALCIUM: 9.8 mg/dL (ref 8.4–10.4)
CO2: 31 mEq/L — ABNORMAL HIGH (ref 22–29)
Chloride: 101 mEq/L (ref 98–109)
Creatinine: 1.3 mg/dL — ABNORMAL HIGH (ref 0.6–1.1)
EGFR: 39 mL/min/{1.73_m2} — AB (ref >90)
Glucose: 113 mg/dl (ref 70–140)
POTASSIUM: 4.4 meq/L (ref 3.5–5.1)
Sodium: 142 mEq/L (ref 136–145)
Total Bilirubin: 0.45 mg/dL (ref 0.20–1.20)
Total Protein: 7.6 g/dL (ref 6.4–8.3)

## 2016-05-05 LAB — CBC & DIFF AND RETIC
BASO%: 0.2 % (ref 0.0–2.0)
Basophils Absolute: 0 10*3/uL (ref 0.0–0.1)
EOS%: 2.8 % (ref 0.0–7.0)
Eosinophils Absolute: 0.2 10*3/uL (ref 0.0–0.5)
HEMATOCRIT: 33.8 % — AB (ref 34.8–46.6)
HGB: 10.5 g/dL — ABNORMAL LOW (ref 11.6–15.9)
Immature Retic Fract: 8.5 % (ref 1.60–10.00)
LYMPH%: 23.3 % (ref 14.0–49.7)
MCH: 29.6 pg (ref 25.1–34.0)
MCHC: 31.1 g/dL — ABNORMAL LOW (ref 31.5–36.0)
MCV: 95.2 fL (ref 79.5–101.0)
MONO#: 0.5 10*3/uL (ref 0.1–0.9)
MONO%: 7.1 % (ref 0.0–14.0)
NEUT%: 66.6 % (ref 38.4–76.8)
NEUTROS ABS: 4.3 10*3/uL (ref 1.5–6.5)
Platelets: 242 10*3/uL (ref 145–400)
RBC: 3.55 10*6/uL — ABNORMAL LOW (ref 3.70–5.45)
RDW: 13.4 % (ref 11.2–14.5)
Retic %: 1.33 % (ref 0.70–2.10)
Retic Ct Abs: 47.22 10*3/uL (ref 33.70–90.70)
WBC: 6.5 10*3/uL (ref 3.9–10.3)
lymph#: 1.5 10*3/uL (ref 0.9–3.3)

## 2016-05-05 LAB — IRON AND TIBC
%SAT: 21 % (ref 21–57)
IRON: 63 ug/dL (ref 41–142)
TIBC: 306 ug/dL (ref 236–444)
UIBC: 243 ug/dL (ref 120–384)

## 2016-05-05 LAB — FERRITIN: Ferritin: 20 ng/ml (ref 9–269)

## 2016-05-05 NOTE — Telephone Encounter (Signed)
Gave patient AVS and calender per 3/22 los. Patient to contact [place].

## 2016-05-08 ENCOUNTER — Encounter: Payer: Self-pay | Admitting: Hematology

## 2016-05-12 ENCOUNTER — Encounter: Payer: Self-pay | Admitting: Pulmonary Disease

## 2016-05-12 ENCOUNTER — Ambulatory Visit (INDEPENDENT_AMBULATORY_CARE_PROVIDER_SITE_OTHER): Payer: Medicare Other | Admitting: Pulmonary Disease

## 2016-05-12 DIAGNOSIS — J441 Chronic obstructive pulmonary disease with (acute) exacerbation: Secondary | ICD-10-CM

## 2016-05-12 DIAGNOSIS — J411 Mucopurulent chronic bronchitis: Secondary | ICD-10-CM

## 2016-05-12 DIAGNOSIS — J9611 Chronic respiratory failure with hypoxia: Secondary | ICD-10-CM

## 2016-05-12 MED ORDER — BECLOMETHASONE DIPROPIONATE 80 MCG/ACT IN AERS: 2.0000 | INHALATION_SPRAY | Freq: Two times a day (BID) | RESPIRATORY_TRACT | 0 refills | Status: DC

## 2016-05-12 NOTE — Assessment & Plan Note (Signed)
In general this is been a stable interval for [facility]. However, she has been experiencing some days that are worse than others and she needs to use ProAir more frequently. She has been compliant with her Stiolto.  Because of her increasing symptoms I'm going to add a sample of an inhaled corticosteroid to see if this helps.  Plan: Sample of Qvar 80 provided, recommended 2 puffs daily in addition to [employer] 2 puffs daily Continue albuterol as needed Advised exercise Follow-up 6 months or sooner if needed

## 2016-05-12 NOTE — Progress Notes (Signed)
Subjective:    Patient ID: Courtney Cline, female    DOB: 13-Sep-1933, 83 y.o.   MRN: [id number]  Synopsis: GOLD Grade D COPD 2015 FEV1 33% pred, on 2L O2 continuously  HPI  Chief Complaint  Patient presents with  . Follow-up    Pt has days of good and bad breathing. Minor cough with clear mucus. Has some chest tighness occassionally. Inhalers are helping patient    Courtney Cline had an OK winter and never go sick which is good.  She didn't get the flu. She is still taking her inhalers regularly.  She reports no side effects from them. She says that some days she needs ProAir every 4-6 hours in a day.  She can't really pinpoint any particular cause. She uses it for dyspnea, but she doesn't use it for cough.  She sometimes feels chest congestion, produces mucus 2-3 times per day, typically clear, rarely clear.  She isn't exercising much.   She continues to use and benefit from her oxygen regularly.  Typically she measures her O2 saturation on supplemental oxygen around 95%, it will rarely drop to about 90%.     Past Medical History:  Diagnosis Date  . Allergic rhinitis   . Arthritis   . Breast calcification seen on mammogram    Right breast  . COPD (chronic obstructive pulmonary disease) ([place])   . GERD (gastroesophageal reflux disease)   . Hyperglycemia   . Hyperlipidemia   . Hypertension   . Low back pain   . On home oxygen therapy    all the time  . Osteopenia   . Peripheral edema   . Shortness of breath   . Subclavian artery stenosis, left ([place])   . Vitamin D deficiency   . Wears dentures    top  . Wears glasses    reading     Review of Systems     Objective:   Physical Exam Vitals:   05/12/16 1035  BP: (!) 142/64  Pulse: 98  SpO2: 96%  Weight: 182 lb 9.6 oz (82.8 kg)  Height: 5' 0.75" (1.543 m)   3L O2  Gen: well appearing HENT: OP clear, TM's clear, neck supple PULM: Poor air movement, normal percussion CV: RRR, no mgr, trace edema GI: BS+, soft,  nontender Derm: no cyanosis or rash Psyche: normal mood and affect       Assessment & Plan:   Chronic hypoxemic respiratory failure (HCC) She continues to use and benefit from 3 L of oxygen consistently. I have advised that she continue using this regimen.  COPD (chronic obstructive pulmonary disease) ([place]) In general this is been a stable interval for [place]. However, she has been experiencing some days that are worse than others and she needs to use ProAir more frequently. She has been compliant with her Stiolto.  Because of her increasing symptoms I'm going to add a sample of an inhaled corticosteroid to see if this helps.  Plan: Sample of Qvar 80 provided, recommended 2 puffs daily in addition to [employer] 2 puffs daily Continue albuterol as needed Advised exercise Follow-up 6 months or sooner if needed   Updated Medication List Outpatient Encounter Prescriptions as of 05/12/2016  Medication Sig  . acetaminophen (TYLENOL) 325 MG tablet Take 650 mg by mouth every 6 (six) hours as needed.  Marland Kitchen albuterol (PROVENTIL HFA;VENTOLIN HFA) 108 (90 BASE) MCG/ACT inhaler Inhale 2 puffs into the lungs every 6 (six) hours as needed for wheezing or shortness of breath.  Marland Kitchen  anastrozole (ARIMIDEX) 1 MG tablet TAKE 1 TABLET BY MOUTH EVERY DAY  . aspirin 81 MG tablet Take 81 mg by mouth daily.  . Cholecalciferol (VITAMIN D) 2000 UNITS CAPS Take 1 capsule by mouth daily.  . Cyanocobalamin (VITAMIN B 12 PO) Take 1 tablet by mouth daily.  . DiphenhydrAMINE HCl (BENADRYL PO) Take 1 tablet by mouth as needed.  . furosemide (LASIX) 40 MG tablet TAKE 1 TABLET (40 MG TOTAL) BY MOUTH 2 (TWO) TIMES DAILY.  Marland Kitchen KLOR-CON M20 20 MEQ tablet TAKE 1 TABLET BY MOUTH DAILY  . lisinopril (PRINIVIL,ZESTRIL) 40 MG tablet Take 40 mg by mouth daily.  Marland Kitchen lovastatin (MEVACOR) 40 MG tablet Take 40 mg by mouth at bedtime.  Marland Kitchen omeprazole (PRILOSEC) 20 MG capsule Take 20 mg by mouth daily.  . Tiotropium  Bromide-Olodaterol (STIOLTO RESPIMAT) 2.5-2.5 MCG/ACT AERS Inhale 2 puffs into the lungs daily.   No facility-administered encounter medications on file as of 05/12/2016.

## 2016-05-12 NOTE — Progress Notes (Signed)
Patient seen in the office today and instructed on use of Qvar redihaler.  Patient expressed understanding and demonstrated technique.

## 2016-05-12 NOTE — Patient Instructions (Signed)
Try taking Qvar 2 puffs daily in addition to Stiolto 2 puffs daily Let me know if you think the Qvar is helping you breathe better, or if your chest congestion has improved If the Qvar is helping, I will call in a prescription Keep taking albuterol (ProAir) as needed for shortness of breath I will see you back in 6 months or sooner if needed

## 2016-05-12 NOTE — Assessment & Plan Note (Signed)
She continues to use and benefit from 3 L of oxygen consistently. I have advised that she continue using this regimen.

## 2016-05-20 ENCOUNTER — Encounter: Payer: Self-pay | Admitting: Cardiology

## 2016-05-23 ENCOUNTER — Ambulatory Visit (INDEPENDENT_AMBULATORY_CARE_PROVIDER_SITE_OTHER): Payer: Medicare Other | Admitting: Cardiology

## 2016-05-23 ENCOUNTER — Encounter: Payer: Self-pay | Admitting: Cardiology

## 2016-05-23 VITALS — BP 110/64 | HR 96 | Ht 61.0 in | Wt 180.8 lb

## 2016-05-23 DIAGNOSIS — Z8679 Personal history of other diseases of the circulatory system: Secondary | ICD-10-CM

## 2016-05-23 NOTE — Patient Instructions (Signed)
Hold Lasix only take if needed ( if systolic blood pressure greater than 150 )   Continue all other medications    Schedule appointment with pharmacist in hypertension clinic in 2 weeks  Monitor blood pressure daily and bring readings to appointment

## 2016-05-23 NOTE — Progress Notes (Signed)
05/23/2016 JOHNIKA ESCARENO   1933/06/11  [id number]  Primary Physician VIA, Lennette Bihari, MD Primary Cardiologist: Dr. Gwenlyn Found   Reason for Visit/CC: Low Blood Pressure  HPI:  ANAPAULA SEVERT is a 83 y.o. female who is being seen today for evaluation for low BP. She is followed by Dr. Gwenlyn Found. She has a remote h/o tobacco use, having quit over 20 years ago. Also with a h/o HTN, HLD, COPD on chronic O2 and PVD w/ known left subclavian artery stenosis that has been followed by Dr. Gwenlyn Found. Her last upper extremity doppler study was in 2014. This demonstrated bilateral mild carotid bifurcation and proximal ICA plaque, resulting in less than 50% diameter stenosis. There was also retrograde systolic flow in the left vertebral artery with asymmetric arm blood pressures, suggesting proximal left subclavian arterial occlusive disease. Given she was asymptomatic and there was no clinical evidence of steal or upper extremity claudication, it was elected to manage this medically. It was outlined in Dr. Kennon Holter last office note that there was a blood pressure differential, although the degree not outlined.   She has noticed changes in her BP recently. She checks her BP using her right arm. SBP has been as low as the 90s. She is sometimes symptomatic with dizziness and mild dyspnea, beyond her usual basline. She denies CP, syncope/ near syncope.   Her BP today is 142/52 on the right and 110/64 on the left. She has cut back on her lasix, from BID to once daily. This was initially prescribed for LEE, which she is no longer bothered with. She takes her lisinopril at night.    Current Meds  Medication Sig  . acetaminophen (TYLENOL) 325 MG tablet Take 650 mg by mouth every 6 (six) hours as needed.  Marland Kitchen albuterol (PROVENTIL HFA;VENTOLIN HFA) 108 (90 BASE) MCG/ACT inhaler Inhale 2 puffs into the lungs every 6 (six) hours as needed for wheezing or shortness of breath.  . anastrozole (ARIMIDEX) 1 MG tablet TAKE 1 TABLET BY  MOUTH EVERY DAY  . aspirin 81 MG tablet Take 81 mg by mouth daily.  . Cholecalciferol (VITAMIN D) 2000 UNITS CAPS Take 1 capsule by mouth daily.  . Cyanocobalamin (VITAMIN B 12 PO) Take 1 tablet by mouth daily.  . DiphenhydrAMINE HCl (BENADRYL PO) Take 1 tablet by mouth as needed.  . furosemide (LASIX) 40 MG tablet TAKE 1 TABLET (40 MG TOTAL) BY MOUTH 2 (TWO) TIMES DAILY.  Marland Kitchen KLOR-CON M20 20 MEQ tablet TAKE 1 TABLET BY MOUTH DAILY  . lisinopril (PRINIVIL,ZESTRIL) 40 MG tablet Take 40 mg by mouth daily.  Marland Kitchen lovastatin (MEVACOR) 40 MG tablet Take 40 mg by mouth at bedtime.  Marland Kitchen omeprazole (PRILOSEC) 20 MG capsule Take 20 mg by mouth daily.  . Tiotropium Bromide-Olodaterol (STIOLTO RESPIMAT) 2.5-2.5 MCG/ACT AERS Inhale 2 puffs into the lungs daily.  . [DISCONTINUED] beclomethasone (QVAR) 80 MCG/ACT inhaler Inhale 2 puffs into the lungs 2 (two) times daily.   No Known Allergies Past Medical History:  Diagnosis Date  . Allergic rhinitis   . Arthritis   . Breast calcification seen on mammogram    Right breast  . COPD (chronic obstructive pulmonary disease) ([place])   . GERD (gastroesophageal reflux disease)   . Hyperglycemia   . Hyperlipidemia   . Hypertension   . Low back pain   . On home oxygen therapy    all the time  . Osteopenia   . Peripheral edema   . Shortness of breath   .  Subclavian artery stenosis, left ([place])   . Vitamin D deficiency   . Wears dentures    top  . Wears glasses    reading   Family History  Problem Relation Age of Onset  . Emphysema Mother   . Emphysema Maternal Grandfather    Past Surgical History:  Procedure Laterality Date  . BREAST LUMPECTOMY WITH RADIOACTIVE SEED LOCALIZATION Right 12/20/2013   Procedure: RIGHT BREAST LUMPECTOMY WITH RADIOACTIVE SEED LOCALIZATION;  Surgeon: Erroll Luna, MD;  Location: [facility];  Service: General;  Laterality: Right;  . CAROTID DUPLEX  2014  . CATARACT EXTRACTION     both eyes   Social History    Social History  . Marital status: Married    Spouse name: [name]  . Number of children: [name]  . Years of education: [name]   Occupational History  . retired     Chartered certified accountant   Social History Main Topics  . Smoking status: Former Smoker    Packs/day: 1.00    Years: 40.00    Types: Cigarettes    Quit date: 02/14/1994  . Smokeless tobacco: Never Used  . Alcohol use No  . Drug use: No  . Sexual activity: Not on file   Other Topics Concern  . Not on file   Social History Narrative  . No narrative on file     Review of Systems: General: negative for chills, fever, night sweats or weight changes.  Cardiovascular: negative for chest pain, dyspnea on exertion, edema, orthopnea, palpitations, paroxysmal nocturnal dyspnea or shortness of breath Dermatological: negative for rash Respiratory: negative for cough or wheezing Urologic: negative for hematuria Abdominal: negative for nausea, vomiting, diarrhea, bright red blood per rectum, melena, or hematemesis Neurologic: negative for visual changes, syncope, or dizziness All other systems reviewed and are otherwise negative except as noted above.   Physical Exam:  Blood pressure 110/64, pulse 96, height 5\' 1"  (1.549 m), weight 180 lb 12.8 oz (82 kg).  General appearance: alert, cooperative and no distress Neck: no adenopathy and no JVD Lungs: clear to auscultation bilaterally Heart: regular rate and rhythm, S1, S2 normal, no murmur, click, rub or gallop Extremities: extremities normal, atraumatic, no cyanosis or edema Pulses: 2+ and symmetric Skin: Skin color, texture, turgor normal. No rashes or lesions Neurologic: Grossly normal  EKG not performed  -- personally reviewed   ASSESSMENT AND PLAN:   1. Low Blood Pressure: stable in clinic today, 142/52 on the right and 110/64 on the left. Blood pressure differential is due to left subclavian artery stenosis. She has had SBPs as low at the 90s (right arm) at home with occasional  dizziness, requiring her to cut her Lasix to once daily dosing, which has helped. She was initially prescribed lasix for LEE, which she has not been bothered by recently. I recommend that she stop scheduled lasix for now and to only take PRN based on LEE or morning SBP >140. Continue to take lisinopril at night. She will keep a log of her home BP readings x 2 weeks and will report back to the HTN clinic to review readings to see if any further adjustments are needed.   2. Left Subclavian Artery Stenosis:      Upper Extremity Aterial Doppler 05/24/2012  IMPRESSION:  1.  Bilateral mild carotid bifurcation and proximal ICA plaque, resulting in less than 50% diameter stenosis. The exam does not exclude plaque ulceration or embolization.  Continued surveillance recommended. 2.  Retrograde systolic flow in the  left vertebral artery with asymmetric arm blood pressures, suggesting proximal left subclavian arterial occlusive disease.  Correlate with  any clinical evidence of subclavian steal.  Pt has been asymptomatic. She denies upper extremity claudication.  Continue ASA and Plavix  3. HLD: on statin therapy with lovastatin    PLAN  Hold Lasix. Only take PRN for LEE or if morning SBP is > 140. Continue lisinopril at night. Keep log of home BP readings. F/u in HTN clinic in 2 weeks for repeat assessment. F/u with Dr. Gwenlyn Found in 6 months.     Brittainy Ladoris Gene, MHS [facility] 05/23/2016 12:30 PM

## 2016-06-09 ENCOUNTER — Ambulatory Visit: Payer: Medicare Other

## 2016-06-09 IMAGING — CR DG CHEST 2V
2 series · 2 of 2 positions shown · non-contrast
Comparison: 09/12/2013

CLINICAL DATA: Shortness of breath, cough and chest pain. History
of COPD and smoking.

EXAM:
CHEST  2 VIEW

[view not recorded (1 of 2)]
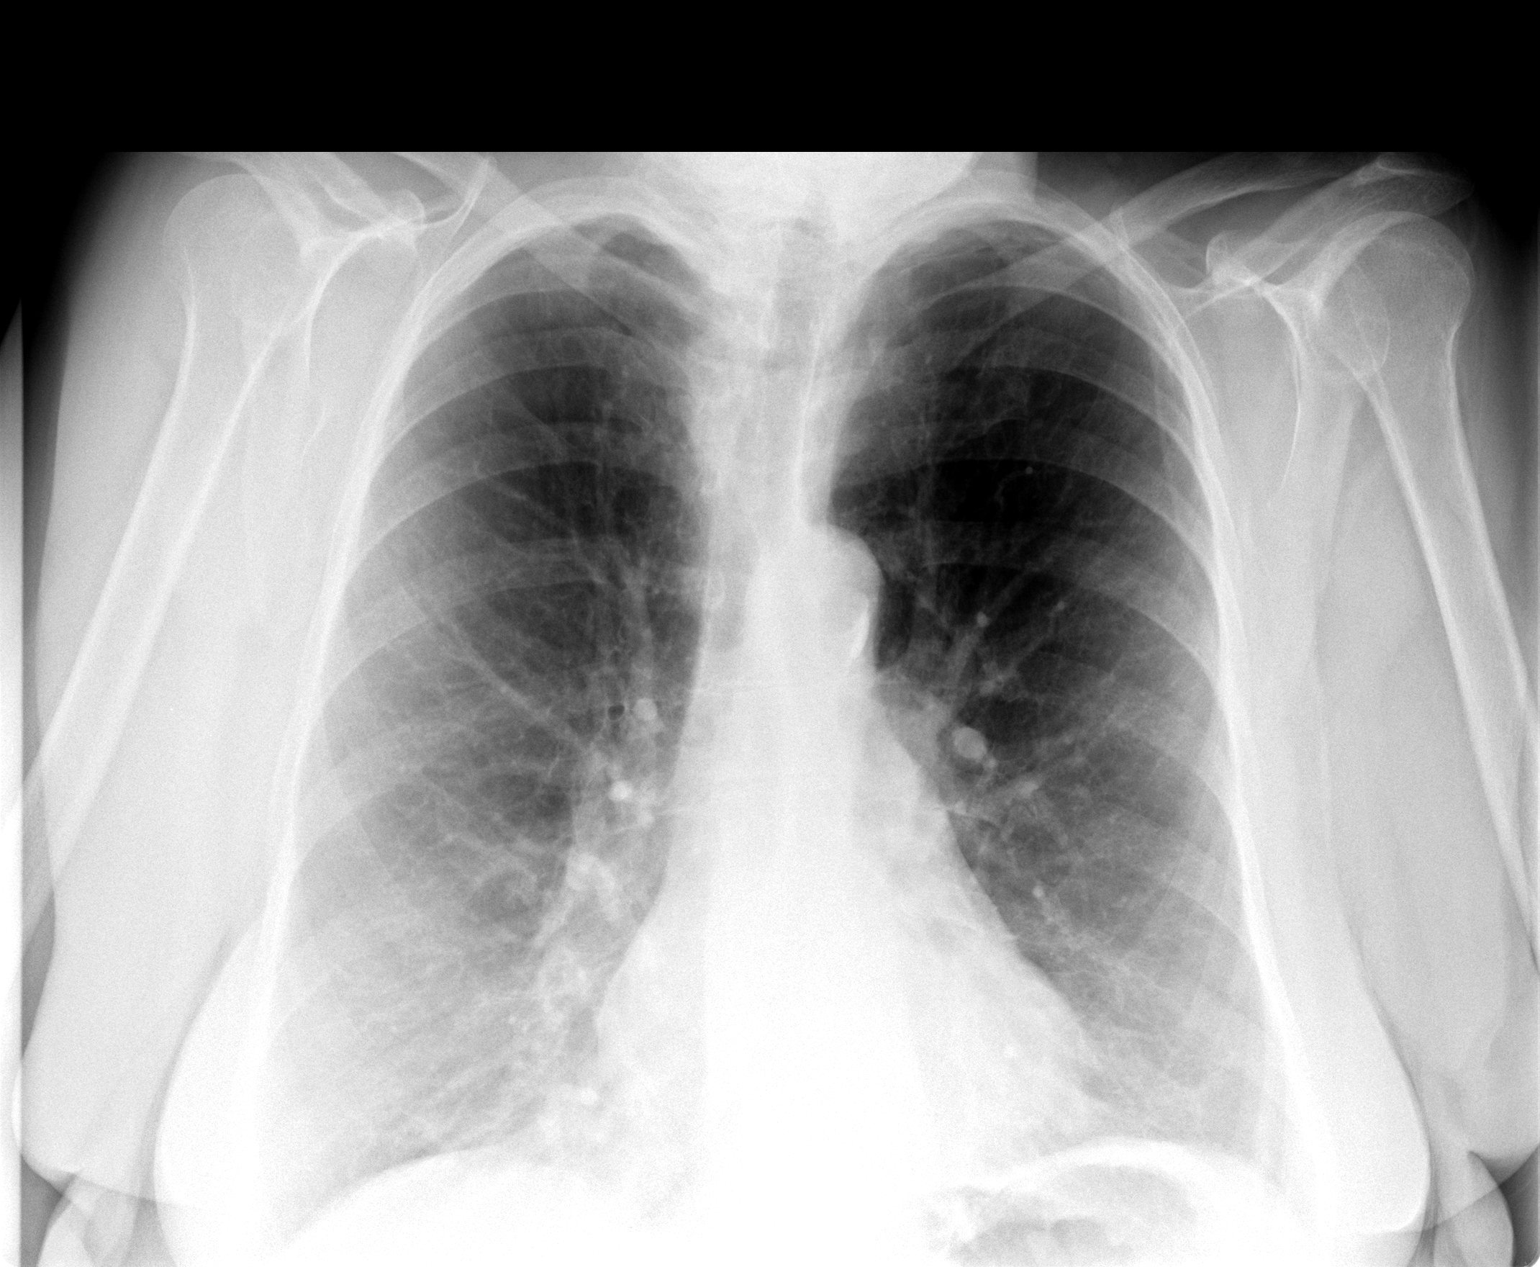

[view not recorded (2 of 2)]
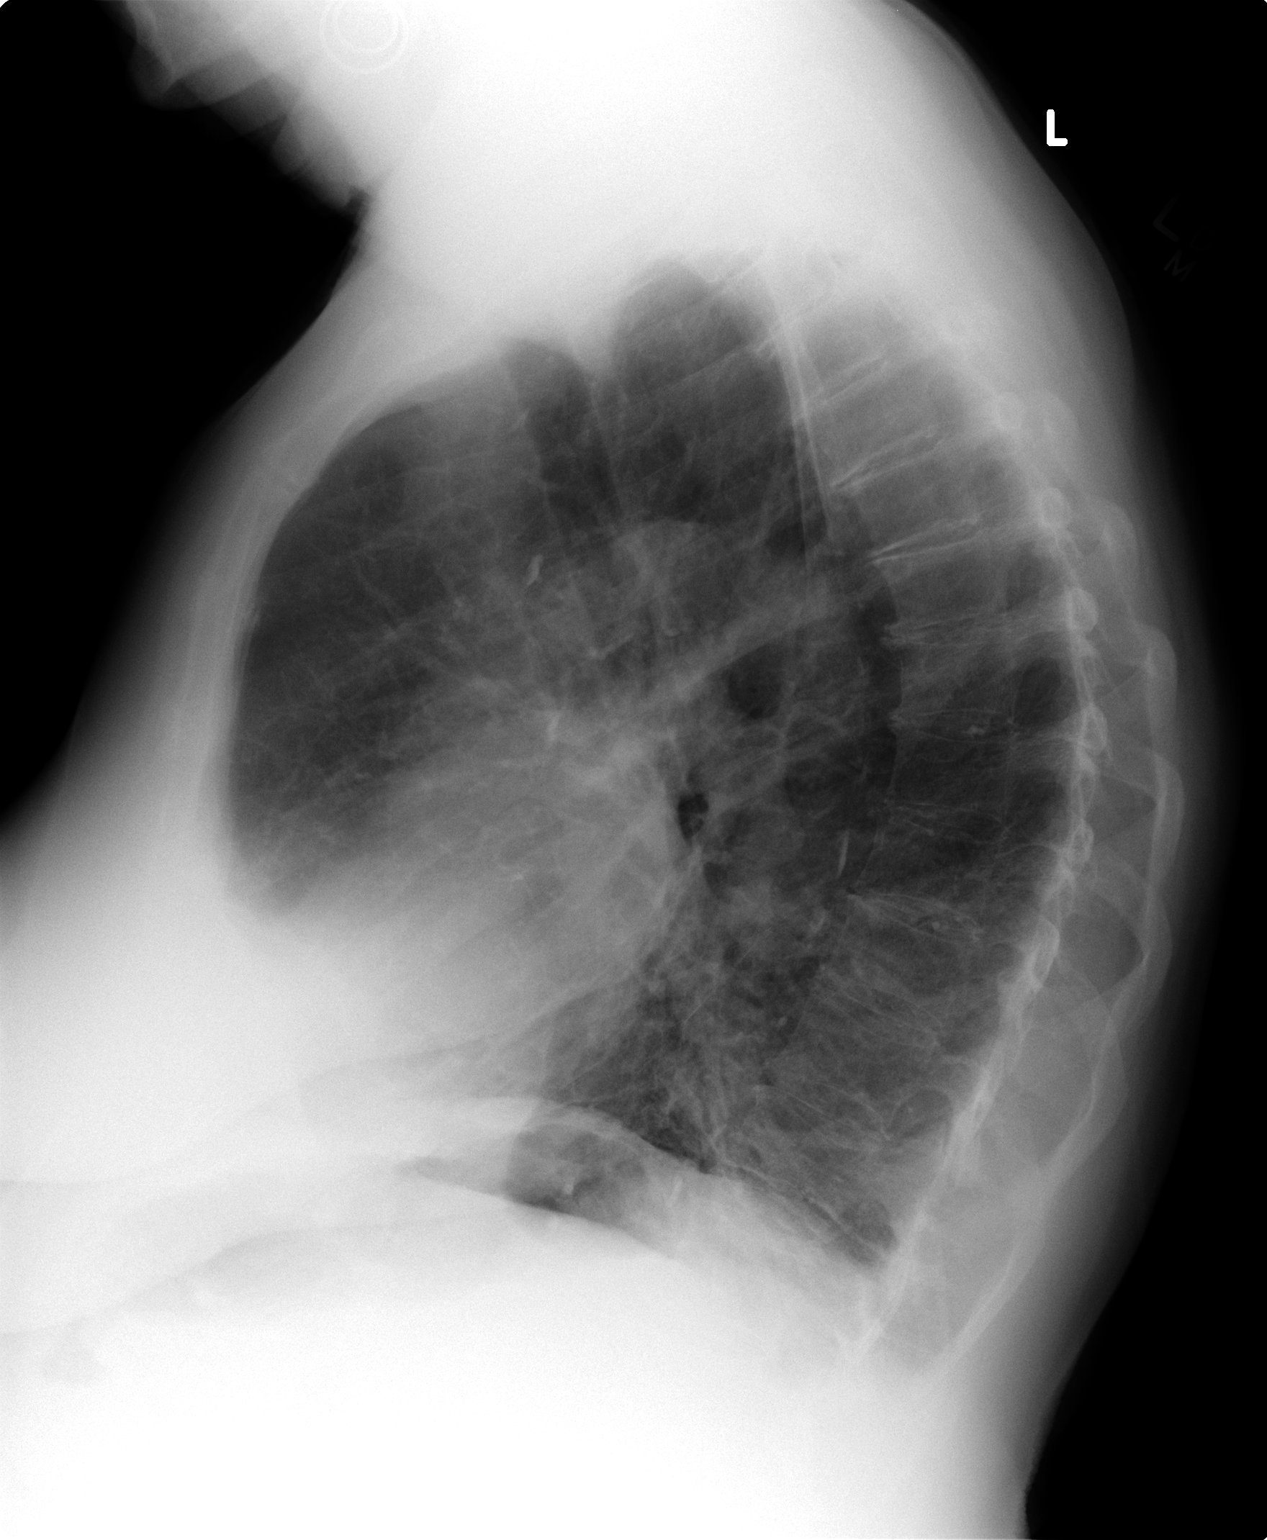

[2 of 2 positions shown; findings below may reference images not displayed]

FINDINGS: Two views of the chest again demonstrate hyperinflation and
compatible with emphysematous changes. Slightly prominent lung
markings at the bases. Mild degenerative changes in lower thoracic
spine. Heart size is normal. Stable scarring at the lung apices.
IMPRESSION: Chronic lung changes compatible with emphysema.

Slightly increased densities at the lung bases could represent
atelectasis. No large areas of consolidation.

## 2016-06-13 ENCOUNTER — Ambulatory Visit (INDEPENDENT_AMBULATORY_CARE_PROVIDER_SITE_OTHER)
Admission: RE | Admit: 2016-06-13 | Discharge: 2016-06-13 | Disposition: A | Discharge status: Home / Self Care | Payer: Medicare Other | Source: Ambulatory Visit | Attending: Pulmonary Disease | Admitting: Pulmonary Disease

## 2016-06-13 ENCOUNTER — Encounter: Payer: Self-pay | Admitting: Pulmonary Disease

## 2016-06-13 ENCOUNTER — Ambulatory Visit (INDEPENDENT_AMBULATORY_CARE_PROVIDER_SITE_OTHER): Payer: Medicare Other | Admitting: Pulmonary Disease

## 2016-06-13 VITALS — BP 140/62 | HR 97 | Temp 98.5°F | Ht 61.0 in | Wt 180.0 lb

## 2016-06-13 DIAGNOSIS — J411 Mucopurulent chronic bronchitis: Secondary | ICD-10-CM | POA: Diagnosis not present

## 2016-06-13 MED ORDER — PREDNISONE 10 MG PO TABS: ORAL_TABLET | ORAL | 0 refills | Status: DC

## 2016-06-13 MED ORDER — LEVOFLOXACIN 750 MG PO TABS: 750.0000 mg | ORAL_TABLET | Freq: Every day | ORAL | 0 refills | Status: DC

## 2016-06-13 NOTE — Progress Notes (Signed)
Courtney Cline    [id number]    03-Jul-1933  Primary Care Physician:VIA, Lennette Bihari, MD  Referring Physician: Dineen Kid, MD [address] [place], [place] [address]  Chief complaint:  Acute visit for cough, sputum, dyspnea  HPI: 83 year old with very severe COPD on 3 L home oxygen, hypertension, hyperlipidemia presents with 5 days of increasing cough with yellow sputum production, subjective chills, recorded temperature of 99.4, dyspnea with wheezing. She notes sats in the low 90s and continues on 3 L oxygen. Her family tried to take her to the emergency room yesterday but she refused. She continues on stiolto inhaler. She is taking albuterol inhaler nebulizer up to 4 times daily.  Outpatient Encounter Prescriptions as of 06/13/2016  Medication Sig  . acetaminophen (TYLENOL) 325 MG tablet Take 650 mg by mouth every 6 (six) hours as needed.  Marland Kitchen albuterol (PROVENTIL HFA;VENTOLIN HFA) 108 (90 BASE) MCG/ACT inhaler Inhale 2 puffs into the lungs every 6 (six) hours as needed for wheezing or shortness of breath.  . anastrozole (ARIMIDEX) 1 MG tablet TAKE 1 TABLET BY MOUTH EVERY DAY  . aspirin 81 MG tablet Take 81 mg by mouth daily.  . Cholecalciferol (VITAMIN D) 2000 UNITS CAPS Take 1 capsule by mouth daily.  . Cyanocobalamin (VITAMIN B 12 PO) Take 1 tablet by mouth daily.  . DiphenhydrAMINE HCl (BENADRYL PO) Take 1 tablet by mouth as needed.  . furosemide (LASIX) 40 MG tablet TAKE 1 TABLET (40 MG TOTAL) BY MOUTH 2 (TWO) TIMES DAILY.  Marland Kitchen KLOR-CON M20 20 MEQ tablet TAKE 1 TABLET BY MOUTH DAILY  . lisinopril (PRINIVIL,ZESTRIL) 40 MG tablet Take 40 mg by mouth daily.  Marland Kitchen lovastatin (MEVACOR) 40 MG tablet Take 40 mg by mouth at bedtime.  Marland Kitchen omeprazole (PRILOSEC) 20 MG capsule Take 20 mg by mouth daily.  . Tiotropium Bromide-Olodaterol (STIOLTO RESPIMAT) 2.5-2.5 MCG/ACT AERS Inhale 2 puffs into the lungs daily.   No facility-administered encounter medications on file as of 06/13/2016.      Allergies as of 06/13/2016  . (No Known Allergies)    Past Medical History:  Diagnosis Date  . Allergic rhinitis   . Arthritis   . Breast calcification seen on mammogram    Right breast  . COPD (chronic obstructive pulmonary disease) ([place])   . GERD (gastroesophageal reflux disease)   . Hyperglycemia   . Hyperlipidemia   . Hypertension   . Low back pain   . On home oxygen therapy    all the time  . Osteopenia   . Peripheral edema   . Shortness of breath   . Subclavian artery stenosis, left ([place])   . Vitamin D deficiency   . Wears dentures    top  . Wears glasses    reading    Past Surgical History:  Procedure Laterality Date  . BREAST LUMPECTOMY WITH RADIOACTIVE SEED LOCALIZATION Right 12/20/2013   Procedure: RIGHT BREAST LUMPECTOMY WITH RADIOACTIVE SEED LOCALIZATION;  Surgeon: Erroll Luna, MD;  Location: [facility];  Service: General;  Laterality: Right;  . CAROTID DUPLEX  2014  . CATARACT EXTRACTION     both eyes    Family History  Problem Relation Age of Onset  . Emphysema Mother   . Emphysema Maternal Grandfather     Social History   Social History  . Marital status: Married    Spouse name: [name]  . Number of children: [name]  . Years of education: [name]  Occupational History  . retired     Chartered certified accountant   Social History Main Topics  . Smoking status: Former Smoker    Packs/day: 1.00    Years: 40.00    Types: Cigarettes    Quit date: 02/14/1994  . Smokeless tobacco: Never Used  . Alcohol use No  . Drug use: No  . Sexual activity: Not on file   Other Topics Concern  . Not on file   Social History Narrative  . No narrative on file    Review of systems: Review of Systems  Constitutional: Negative for fever and chills.  HENT: Negative.   Eyes: Negative for blurred vision.  Respiratory: as per HPI  Cardiovascular: Negative for chest pain and palpitations.  Gastrointestinal: Negative for vomiting, diarrhea, blood per  rectum. Genitourinary: Negative for dysuria, urgency, frequency and hematuria.  Musculoskeletal: Negative for myalgias, back pain and joint pain.  Skin: Negative for itching and rash.  Neurological: Negative for dizziness, tremors, focal weakness, seizures and loss of consciousness.  Endo/Heme/Allergies: Negative for environmental allergies.  Psychiatric/Behavioral: Negative for depression, suicidal ideas and hallucinations.  All other systems reviewed and are negative.  Physical Exam: Blood pressure 140/62, pulse 97, temperature 98.5 F (36.9 C), temperature source Oral, height 5\' 1"  (1.549 m), weight 180 lb (81.6 kg), SpO2 97 %. Gen:      No acute distress HEENT:  EOMI, sclera anicteric Neck:     No masses; no thyromegaly Lungs:    Reduced air entry, no wheeze; normal respiratory effort CV:         Regular rate and rhythm; no murmurs Abd:      + bowel sounds; soft, non-tender; no palpable masses, no distension Ext:    No edema; adequate peripheral perfusion Skin:      Warm and dry; no rash Neuro: alert and oriented x 3 Psych: normal mood and affect  Data Reviewed: Chest x-ray 10/15/15-hyperinflation, emphysematous changes I have reviewed the images   PFTs 09/22/13 FVC 1.45 [62% FEV1 0.48 (28%) F/F 33  Assessment:  Severe COPD with acute exacerbation Bronchitis. We'll treat her with Levaquin for 7 days, prednisone taper. She'll get a chest x-ray for evaluation of infiltrate. I have advised her and her son to take her to the emergency room if symptoms worsen.  Plan/Recommendations: -Levaquin 750 mg a day for 7 days -CXR -Prednisone starting at 40 mg. Reduce dose by 10 mg every 3 days -Continue inhalers, nebs  Marshell Garfinkel MD [facility] Pager [phone] 06/13/2016, 12:35 PM  CC: Dineen Kid, MD

## 2016-06-13 NOTE — Patient Instructions (Addendum)
We will give your prednisone taper starting at 40 mg. Reduce dose to 10 mg every 3 days We'll start you on Levaquin 750 mg a day for 7 days next and continue using her inhalers and nebulizers Check CXR If symptoms worsen please give [place] a call or go to the nearest emergency room  Follow-up with Dr. Lake Bells

## 2016-06-14 ENCOUNTER — Telehealth: Payer: Self-pay | Admitting: Pulmonary Disease

## 2016-06-14 NOTE — Telephone Encounter (Signed)
CXR shows COPD. There is no acute pneumonia.  PM

## 2016-06-14 NOTE — Telephone Encounter (Signed)
Pt requesting cxr results from yesterday. PM please advise.  Thanks!

## 2016-06-14 NOTE — Telephone Encounter (Signed)
Spoke with pt, aware of results/recs.  Nothing further needed.  

## 2016-06-16 ENCOUNTER — Other Ambulatory Visit: Payer: Self-pay

## 2016-06-16 MED ORDER — PREDNISONE 10 MG PO TABS: 10.0000 mg | ORAL_TABLET | Freq: Every day | ORAL | 0 refills | Status: DC

## 2016-06-16 NOTE — Telephone Encounter (Signed)
During pt's visit with PM on 4/30. Pt was prescribed prednisone taper starting with 40mg  decreasing by 10mg  every three days. Rx was sent in #24. [facility] [address] [facility] sent Rx request for 6 tabs to complete course. Rx has been sent to preferred pharmacy. Nothing further needed.

## 2016-07-07 ENCOUNTER — Encounter: Payer: Self-pay | Admitting: Pulmonary Disease

## 2016-07-07 ENCOUNTER — Ambulatory Visit (INDEPENDENT_AMBULATORY_CARE_PROVIDER_SITE_OTHER): Payer: Medicare Other | Admitting: Pulmonary Disease

## 2016-07-07 VITALS — BP 118/58 | HR 113 | Ht 61.0 in | Wt 176.2 lb

## 2016-07-07 DIAGNOSIS — J440 Chronic obstructive pulmonary disease with acute lower respiratory infection: Secondary | ICD-10-CM

## 2016-07-07 DIAGNOSIS — J209 Acute bronchitis, unspecified: Secondary | ICD-10-CM

## 2016-07-07 MED ORDER — FLUTICASONE PROPIONATE 50 MCG/ACT NA SUSP: 2.0000 | Freq: Every day | NASAL | 2 refills | Status: DC

## 2016-07-07 MED ORDER — PREDNISONE 20 MG PO TABS: 20.0000 mg | ORAL_TABLET | Freq: Every day | ORAL | 0 refills | Status: DC

## 2016-07-07 MED ORDER — BUDESONIDE 180 MCG/ACT IN AEPB: 1.0000 | INHALATION_SPRAY | Freq: Two times a day (BID) | RESPIRATORY_TRACT | 0 refills | Status: DC

## 2016-07-07 NOTE — Progress Notes (Signed)
Pt was shown was how to properly use pulmicort flexhaler. She understood and had no further questions

## 2016-07-07 NOTE — Assessment & Plan Note (Signed)
Patient with very severe COPD and has been flared up and congested the last 2 months. She was seen a month ago and chest x-ray was unremarkable. She got better with prednisone and Levaquin but symptoms recurred once she finished the medicines. She tried Qvar back in 04/2016 but she "felt sick" a few days into using it. She stopped qvar.   She is somewhat tired today with some wheezing. She feels congested. She also has nasal congestion. She uses 3 L oxygen 24 7.  Plan : 1. Start pulmicort 180 mcg/dose 1 puff BID. Told her to let us know if she has side effects with medicine. 2. Prednisone at 20 mg a day for 7 days. If she starts getting worse again, she may need a longer prednisone course. 3. We'll hold off on antibiotics. 4. Continue stiolto 2 puffs daily and albuterol prn 5. Currently on 3L 24/7. We will walk her today to determine if she'll need more than 3 L with exertion. 6. Start flonase 2 squirts per nostril at bedtime. 7. I suggest getting an ONO on f/u. May need more than 3L at HS.   8. Consider bipap or NIV if her sob persists, difficult to control.

## 2016-07-07 NOTE — Progress Notes (Signed)
Subjective:    Patient ID: Courtney Cline, female    DOB: August 24, 1933, 83 y.o.   MRN: [id number]  HPI  Patient is here urgently to be seen for acute and chronic dyspnea, wheezing, cough, congestion. Last time she was seen was back in April 2018. She has severe COPD, on chronic oxygen therapy. She was diagnosed with bronchitis and was given prednisone and abx. She has been off prednisone and abx for 1 week.  She got better but started to be congested. She remains SOB. On and off cough. (-) fevers, chills. She also has nasal congestion last 2-3 months.   She is on 3L O2 24/7.   She continues to be winded the last 2- 3 months.  Review of Systems  Constitutional: Negative.   HENT: Negative.   Eyes: Negative.   Respiratory: Positive for cough, shortness of breath and wheezing.   Cardiovascular: Negative.   Gastrointestinal: Negative.   Endocrine: Negative.   Genitourinary: Negative.   Musculoskeletal: Negative.   Skin: Negative.   Allergic/Immunologic: Negative.   Neurological: Negative.   Psychiatric/Behavioral: Negative.        Objective:   Physical Exam  Vitals:  Vitals:   07/07/16 1120  BP: (!) 118/58  Pulse: (!) 113  SpO2: 96%  Weight: 176 lb 3.2 oz (79.9 kg)  Height: 5\' 1"  (1.549 m)    Constitutional/General:  Pleasant, well-nourished, well-developed, not in any distress,  Comfortably seating.  Well kempt  Body mass index is 33.29 kg/m. Wt Readings from Last 3 Encounters:  07/07/16 176 lb 3.2 oz (79.9 kg)  06/13/16 180 lb (81.6 kg)  05/23/16 180 lb 12.8 oz (82 kg)    HEENT: Pupils equal and reactive to light and accommodation. Anicteric sclerae. Normal nasal mucosa.   No oral  lesions,  mouth clear,  oropharynx clear, no postnasal drip. (-) Oral thrush. No dental caries.  Airway - Mallampati class III  Neck: No masses. Midline trachea. No JVD, (-) LAD. (-) bruits appreciated.  Respiratory/Chest: Grossly normal chest. (-) deformity. (-) Accessory muscle  use.  Symmetric expansion. (-) Tenderness on palpation.  Resonant on percussion.  Diminished BS on both lower lung zones. Wheezing in BULF (-)  crackles, rhonchi (-) egophony  Cardiovascular: Regular rate and  rhythm, heart sounds normal, no murmur or gallops, no peripheral edema  Gastrointestinal:  Normal bowel sounds. Soft, non-tender. No hepatosplenomegaly.  (-) masses.   Musculoskeletal:  Normal muscle tone. Normal gait.   Extremities: Grossly normal. (-) clubbing, cyanosis.  (-) edema  Skin: (-) rash,lesions seen.   Neurological/Psychiatric : alert, oriented to time, place, person. Normal mood and affect          Assessment & Plan:  COPD with acute exacerbation ([place]) Patient with very severe COPD and has been flared up and congested the last 2 months. She was seen a month ago and chest x-ray was unremarkable. She got better with prednisone and Levaquin but symptoms recurred once she finished the medicines. She tried Qvar back in 04/2016 but she "felt sick" a few days into using it. She stopped qvar.   She is somewhat tired today with some wheezing. She feels congested. She also has nasal congestion. She uses 3 L oxygen 24 7.  Plan : 1. Start pulmicort 180 mcg/dose 1 puff BID. Told her to let us know if she has side effects with medicine. 2. Prednisone at 20 mg a day for 7 days. If she starts getting worse again, she may need a  longer prednisone course. 3. We'll hold off on antibiotics. 4. Continue stiolto 2 puffs daily and albuterol prn 5. Currently on 3L 24/7. We will walk her today to determine if she'll need more than 3 L with exertion. 6. Start flonase 2 squirts per nostril at bedtime. 7. I suggest getting an ONO on f/u. May need more than 3L at HS.   8. Consider bipap or NIV if her sob persists, difficult to control.   I spent at least 25 minutes with the patient today and more than 50% was spent counseling the patient regarding disease and management and  facilitating labs and medications.         Monica Becton, MD 07/07/2016, 11:47 AM [facility] Pager [phone] After 3 pm or if no answer, call [phone]

## 2016-07-07 NOTE — Patient Instructions (Addendum)
It was a pleasure taking care of you today!  You are diagnosed with bronchitis and acute COPD flare up  Start Pulmicort, one puff twice a day. Rinse your mouth each time you use  Continues Stiolto  2 puffs daily. Start prednisone, 20 mg per tablet, 1 tablet daily.  Please call the office if you are having adverse reaction to meds/antibiotics.  Please call the office your symptoms are getting worse despite the meds/antibiotics.   Return to clinic in 4-6 weeks with Dr. Lake Bells or NP

## 2016-08-04 ENCOUNTER — Other Ambulatory Visit (INDEPENDENT_AMBULATORY_CARE_PROVIDER_SITE_OTHER): Payer: Medicare Other

## 2016-08-04 ENCOUNTER — Telehealth: Payer: Self-pay | Admitting: Pulmonary Disease

## 2016-08-04 ENCOUNTER — Encounter: Payer: Self-pay | Admitting: Pulmonary Disease

## 2016-08-04 ENCOUNTER — Ambulatory Visit (INDEPENDENT_AMBULATORY_CARE_PROVIDER_SITE_OTHER): Payer: Medicare Other | Admitting: Pulmonary Disease

## 2016-08-04 VITALS — BP 128/64 | HR 111 | Ht 61.0 in | Wt 178.4 lb

## 2016-08-04 DIAGNOSIS — J441 Chronic obstructive pulmonary disease with (acute) exacerbation: Secondary | ICD-10-CM

## 2016-08-04 DIAGNOSIS — J9611 Chronic respiratory failure with hypoxia: Secondary | ICD-10-CM

## 2016-08-04 LAB — CBC WITH DIFFERENTIAL/PLATELET
BASOS PCT: 0.4 % (ref 0.0–3.0)
Basophils Absolute: 0 10*3/uL (ref 0.0–0.1)
Eosinophils Absolute: 0.2 10*3/uL (ref 0.0–0.7)
Eosinophils Relative: 3.1 % (ref 0.0–5.0)
HEMATOCRIT: 31.7 % — AB (ref 36.0–46.0)
Hemoglobin: 10.3 g/dL — ABNORMAL LOW (ref 12.0–15.0)
LYMPHS ABS: 1.4 10*3/uL (ref 0.7–4.0)
LYMPHS PCT: 21.6 % (ref 12.0–46.0)
MCHC: 32.4 g/dL (ref 30.0–36.0)
MCV: 90.3 fl (ref 78.0–100.0)
MONOS PCT: 8 % (ref 3.0–12.0)
Monocytes Absolute: 0.5 10*3/uL (ref 0.1–1.0)
NEUTROS ABS: 4.4 10*3/uL (ref 1.4–7.7)
NEUTROS PCT: 66.9 % (ref 43.0–77.0)
PLATELETS: 257 10*3/uL (ref 150.0–400.0)
RBC: 3.51 Mil/uL — ABNORMAL LOW (ref 3.87–5.11)
RDW: 15 % (ref 11.5–15.5)
WBC: 6.5 10*3/uL (ref 4.0–10.5)

## 2016-08-04 MED ORDER — ROFLUMILAST 500 MCG PO TABS: 500.0000 ug | ORAL_TABLET | Freq: Every day | ORAL | 0 refills | Status: DC

## 2016-08-04 MED ORDER — FLUTICASONE-UMECLIDIN-VILANT 100-62.5-25 MCG/INH IN AEPB: 1.0000 | INHALATION_SPRAY | Freq: Every day | RESPIRATORY_TRACT | 0 refills | Status: DC

## 2016-08-04 MED ORDER — ALBUTEROL SULFATE HFA 108 (90 BASE) MCG/ACT IN AERS: 2.0000 | INHALATION_SPRAY | Freq: Four times a day (QID) | RESPIRATORY_TRACT | 11 refills | Status: DC | PRN

## 2016-08-04 NOTE — Patient Instructions (Signed)
For your advanced COPD with recurrent exacerbations: Take the samples of Daliresp one pill daily no matter how you feel, call me if you have any side effects from this. As we discussed GI side effects like diarrhea are the most common. Continue taking Stiolto for the rest of the month, then once you finish the Stiolto you can take the sample of Trelegy I gave you. Call me after one week of using the Trelegy to let me know if you think it's helpful We will check blood work today called a CBC and serum IgE to see if there are allergies causing your symptoms  For your deconditioning: I want you to try to walk as much as you can Talked your primary care doctor about your back pain  For your chronic respiratory failure with hypoxemia: Keep using 3 L of oxygen continuously  I will see you back in 3 months or sooner if needed

## 2016-08-04 NOTE — Telephone Encounter (Signed)
rx sent to preferred pharmacy.  Pt aware.  Nothing further needed.  

## 2016-08-04 NOTE — Progress Notes (Signed)
Subjective:    Patient ID: Courtney Cline, female    DOB: 1933/10/12, 83 y.o.   MRN: [id number]  Synopsis: GOLD Grade D COPD   HPI  Chief Complaint  Patient presents with  . Follow-up    pt doing better since last OV, still has sob with exertion.     Braxton was pretty sick for a week last month and came here to see my partner who prescribed prednisone.  She says that this really helped.  She said that she was miserable and felt like the prednisone really helped.  She feels back to normal now.  She hasn't been doing much walking due to back pain.  Her breathing is closer to baseline, but she is still using her rescue inhaler about every four hours.  She is still taking Stiolto.  She says that some activities will make her breathless like cooking a large meal or doing laundry.  She is not taking the Pulmicort.   Past Medical History:  Diagnosis Date  . Allergic rhinitis   . Arthritis   . Breast calcification seen on mammogram    Right breast  . COPD (chronic obstructive pulmonary disease) ([place])   . GERD (gastroesophageal reflux disease)   . Hyperglycemia   . Hyperlipidemia   . Hypertension   . Low back pain   . On home oxygen therapy    all the time  . Osteopenia   . Peripheral edema   . Shortness of breath   . Subclavian artery stenosis, left ([place])   . Vitamin D deficiency   . Wears dentures    top  . Wears glasses    reading     Review of Systems  Constitutional: Negative for chills, fatigue and fever.  HENT: Negative for postnasal drip, rhinorrhea and sinus pain.   Respiratory: Positive for shortness of breath. Negative for cough and wheezing.   Cardiovascular: Negative for palpitations and leg swelling.       Objective:   Physical Exam Vitals:   08/04/16 1104  BP: 128/64  Pulse: (!) 111  SpO2: 94%  Weight: 178 lb 6.4 oz (80.9 kg)  Height: 5\' 1"  (1.549 m)   3L O2  Gen: chronically ill appearing HENT: OP clear, TM's clear, neck supple PULM: Poor  air movement B, normal percussion CV: RRR, no mgr, trace edema GI: BS+, soft, nontender Derm: no cyanosis or rash Psyche: normal mood and affect  Spiro: 2015 FEV1 33% pred, on 2L O2 continuously  Chest imaging: 05/2016 CXR images independently reviewed: Hyperinflation consistent with COPD, no pulmonary parenchymal abnormality   Records from her visit with my partner earlier this year reviewed were she was treated with prednisone for a COPD exacerbation.    Assessment & Plan:   COPD with acute exacerbation ([place]) - Plan: CBC w/Diff, IgE  Chronic hypoxemic respiratory failure (HCC)   Discussion: I am concerned about Courtney Cline because she has had recurrent exacerbations of her severe COPD this year. We know the recurrent exacerbations are associated with a poor prognosis. I would like to try to break the cycle by starting her on a medicine intended to reduce the frequency of exacerbations. We will start Roflumilast, samples given today. After she finishes the Stiolto she filled yesterday I want her to try a sample of Trelegy to see if this is more effective as well. I have encouraged her to exercise as much as possible to help with her deconditioning. We will check a CBC with differential and  a serum IgE to assess for an ABPA-like syndrome.  Plan: For your advanced COPD with recurrent exacerbations: Take the samples of Daliresp one pill daily no matter how you feel, call me if you have any side effects from this. As we discussed GI side effects like diarrhea are the most common. Continue taking Stiolto for the rest of the month, then once you finish the Stiolto you can take the sample of Trelegy I gave you. Call me after one week of using the Trelegy to let me know if you think it's helpful We will check blood work today called a CBC and serum IgE to see if there are allergies causing your symptoms  For your deconditioning: I want you to try to walk as much as you can Talked your primary  care doctor about your back pain  For your chronic respiratory failure with hypoxemia: Keep using 3 L of oxygen continuously  I will see you back in 3 months or sooner if needed  Updated Medication List Outpatient Encounter Prescriptions as of 08/04/2016  Medication Sig  . acetaminophen (TYLENOL) 325 MG tablet Take 650 mg by mouth every 6 (six) hours as needed.  Marland Kitchen albuterol (PROVENTIL HFA;VENTOLIN HFA) 108 (90 BASE) MCG/ACT inhaler Inhale 2 puffs into the lungs every 6 (six) hours as needed for wheezing or shortness of breath.  . anastrozole (ARIMIDEX) 1 MG tablet TAKE 1 TABLET BY MOUTH EVERY DAY  . aspirin 81 MG tablet Take 81 mg by mouth daily.  . budesonide (PULMICORT FLEXHALER) 180 MCG/ACT inhaler Inhale 1 puff into the lungs 2 (two) times daily.  . Cholecalciferol (VITAMIN D) 2000 UNITS CAPS Take 1 capsule by mouth daily.  . Cyanocobalamin (VITAMIN B 12 PO) Take 1 tablet by mouth daily.  . DiphenhydrAMINE HCl (BENADRYL PO) Take 1 tablet by mouth as needed.  . fluticasone (FLONASE) 50 MCG/ACT nasal spray Place 2 sprays into both nostrils at bedtime.  . furosemide (LASIX) 40 MG tablet TAKE 1 TABLET (40 MG TOTAL) BY MOUTH 2 (TWO) TIMES DAILY.  Marland Kitchen KLOR-CON M20 20 MEQ tablet TAKE 1 TABLET BY MOUTH DAILY  . lisinopril (PRINIVIL,ZESTRIL) 40 MG tablet Take 40 mg by mouth daily.  Marland Kitchen lovastatin (MEVACOR) 40 MG tablet Take 40 mg by mouth at bedtime.  Marland Kitchen omeprazole (PRILOSEC) 20 MG capsule Take 20 mg by mouth daily.  . Tiotropium Bromide-Olodaterol (STIOLTO RESPIMAT) 2.5-2.5 MCG/ACT AERS Inhale 2 puffs into the lungs daily.  . [DISCONTINUED] predniSONE (DELTASONE) 20 MG tablet Take 1 tablet (20 mg total) by mouth daily with breakfast. (Patient not taking: Reported on 08/04/2016)   No facility-administered encounter medications on file as of 08/04/2016.

## 2016-08-05 LAB — IGE: IgE (Immunoglobulin E), Serum: 242 kU/L — ABNORMAL HIGH (ref <115)

## 2016-08-12 ENCOUNTER — Telehealth: Payer: Self-pay | Admitting: Pulmonary Disease

## 2016-08-12 NOTE — Telephone Encounter (Signed)
No major findings, defer to Dr Anastasia Pall review next week

## 2016-08-12 NOTE — Telephone Encounter (Signed)
Pt aware of below message and voiced her understanding.  BQ please advise on labs. Thanks

## 2016-08-12 NOTE — Telephone Encounter (Signed)
Please advise Dr Melvyn Novas is anything is severely abnormal on the patients most recent lab results.  Pt is calling requesting results and BQ is at the hospital. Pt is okay with getting final results on Monday, she just wants prelim results as she has been waiting 1 week. Thanks.   Recent Results (from the past 2160 hour(s))  CBC w/Diff     Status: Abnormal   Collection Time: 08/04/16 11:56 AM  Result Value Ref Range   WBC 6.5 4.0 - 10.5 K/uL   RBC 3.51 (L) 3.87 - 5.11 Mil/uL   Hemoglobin 10.3 (L) 12.0 - 15.0 g/dL   HCT 31.7 (L) 36.0 - 46.0 %   MCV 90.3 78.0 - 100.0 fl   MCHC 32.4 30.0 - 36.0 g/dL   RDW 15.0 11.5 - 15.5 %   Platelets 257.0 150.0 - 400.0 K/uL   Neutrophils Relative % 66.9 43.0 - 77.0 %   Lymphocytes Relative 21.6 12.0 - 46.0 %   Monocytes Relative 8.0 3.0 - 12.0 %   Eosinophils Relative 3.1 0.0 - 5.0 %   Basophils Relative 0.4 0.0 - 3.0 %   Neutro Abs 4.4 1.4 - 7.7 K/uL   Lymphs Abs 1.4 0.7 - 4.0 K/uL   Monocytes Absolute 0.5 0.1 - 1.0 K/uL   Eosinophils Absolute 0.2 0.0 - 0.7 K/uL   Basophils Absolute 0.0 0.0 - 0.1 K/uL  IgE     Status: Abnormal   Collection Time: 08/04/16 11:56 AM  Result Value Ref Range   IgE (Immunoglobulin E), Serum 242 (H) <115 kU/L

## 2016-08-12 NOTE — Telephone Encounter (Signed)
Courtney Cline, please let her know her labs were suggestive of an allergy.  We'll get more information from her about this on the next visit.

## 2016-08-15 NOTE — Telephone Encounter (Signed)
Attempted to contact pt. No answer and I could not leave a message. Will try back.

## 2016-08-16 NOTE — Telephone Encounter (Signed)
Called and spoke with pt and she is aware of BQ recs. Nothing further is needed.

## 2016-10-18 ENCOUNTER — Ambulatory Visit
Admission: RE | Admit: 2016-10-18 | Discharge: 2016-10-18 | Disposition: A | Discharge status: Home / Self Care | Payer: Medicare Other | Source: Ambulatory Visit | Attending: Hematology | Admitting: Hematology

## 2016-10-18 DIAGNOSIS — Z170 Estrogen receptor positive status [ER+]: Principal | ICD-10-CM

## 2016-10-18 DIAGNOSIS — C50211 Malignant neoplasm of upper-inner quadrant of right female breast: Secondary | ICD-10-CM

## 2016-10-18 HISTORY — DX: Malignant neoplasm of unspecified site of unspecified female breast: C50.919

## 2016-10-27 ENCOUNTER — Other Ambulatory Visit: Payer: Self-pay | Admitting: Cardiovascular Disease

## 2016-10-27 NOTE — Telephone Encounter (Signed)
Rx(s) sent to pharmacy electronically.  

## 2016-11-07 ENCOUNTER — Other Ambulatory Visit (HOSPITAL_BASED_OUTPATIENT_CLINIC_OR_DEPARTMENT_OTHER): Payer: Medicare Other

## 2016-11-07 ENCOUNTER — Encounter: Payer: Self-pay | Admitting: Hematology

## 2016-11-07 ENCOUNTER — Telehealth: Payer: Self-pay | Admitting: Student in an Organized Health Care Education/Training Program

## 2016-11-07 ENCOUNTER — Telehealth: Payer: Self-pay | Admitting: Pulmonary Disease

## 2016-11-07 ENCOUNTER — Ambulatory Visit (HOSPITAL_BASED_OUTPATIENT_CLINIC_OR_DEPARTMENT_OTHER): Payer: Medicare Other | Admitting: Hematology

## 2016-11-07 VITALS — BP 162/46 | HR 99 | Temp 97.9°F | Resp 17 | Ht 61.0 in | Wt 185.0 lb

## 2016-11-07 DIAGNOSIS — C50211 Malignant neoplasm of upper-inner quadrant of right female breast: Secondary | ICD-10-CM

## 2016-11-07 DIAGNOSIS — D649 Anemia, unspecified: Secondary | ICD-10-CM | POA: Diagnosis not present

## 2016-11-07 DIAGNOSIS — Z171 Estrogen receptor negative status [ER-]: Secondary | ICD-10-CM

## 2016-11-07 DIAGNOSIS — M8580 Other specified disorders of bone density and structure, unspecified site: Secondary | ICD-10-CM

## 2016-11-07 DIAGNOSIS — Z170 Estrogen receptor positive status [ER+]: Principal | ICD-10-CM

## 2016-11-07 DIAGNOSIS — N189 Chronic kidney disease, unspecified: Secondary | ICD-10-CM | POA: Diagnosis not present

## 2016-11-07 LAB — COMPREHENSIVE METABOLIC PANEL WITH GFR
ALT: <6 U/L
AST: 12 U/L (ref 5–34)
Albumin: 3.6 g/dL (ref 3.5–5.0)
Alkaline Phosphatase: 74 U/L (ref 40–150)
Anion Gap: 9 meq/L (ref 3–11)
BUN: 28.2 mg/dL — ABNORMAL HIGH (ref 7.0–26.0)
CO2: 30 meq/L — ABNORMAL HIGH (ref 22–29)
Calcium: 9.7 mg/dL (ref 8.4–10.4)
Chloride: 104 meq/L (ref 98–109)
Creatinine: 1.3 mg/dL — ABNORMAL HIGH (ref 0.6–1.1)
EGFR: 40 ml/min/1.73 m2 — ABNORMAL LOW
Glucose: 118 mg/dL (ref 70–140)
Potassium: 4.3 meq/L (ref 3.5–5.1)
Sodium: 142 meq/L (ref 136–145)
Total Bilirubin: 0.31 mg/dL (ref 0.20–1.20)
Total Protein: 7.3 g/dL (ref 6.4–8.3)

## 2016-11-07 LAB — CBC & DIFF AND RETIC
BASO%: 0.6 % (ref 0.0–2.0)
Basophils Absolute: 0 10*3/uL (ref 0.0–0.1)
EOS%: 3.2 % (ref 0.0–7.0)
Eosinophils Absolute: 0.2 10*3/uL (ref 0.0–0.5)
HCT: 31 % — ABNORMAL LOW (ref 34.8–46.6)
HGB: 9.6 g/dL — ABNORMAL LOW (ref 11.6–15.9)
Immature Retic Fract: 8.1 % (ref 1.60–10.00)
LYMPH%: 24.9 % (ref 14.0–49.7)
MCH: 29.9 pg (ref 25.1–34.0)
MCHC: 31 g/dL — ABNORMAL LOW (ref 31.5–36.0)
MCV: 96.6 fL (ref 79.5–101.0)
MONO#: 0.4 10*3/uL (ref 0.1–0.9)
MONO%: 7.2 % (ref 0.0–14.0)
NEUT#: 3.5 10*3/uL (ref 1.5–6.5)
NEUT%: 64.1 % (ref 38.4–76.8)
Platelets: 205 10*3/uL (ref 145–400)
RBC: 3.21 10*6/uL — ABNORMAL LOW (ref 3.70–5.45)
RDW: 13.4 % (ref 11.2–14.5)
Retic %: 1.36 % (ref 0.70–2.10)
Retic Ct Abs: 43.66 10*3/uL (ref 33.70–90.70)
WBC: 5.4 10*3/uL (ref 3.9–10.3)
lymph#: 1.3 10*3/uL (ref 0.9–3.3)

## 2016-11-07 MED ORDER — ANASTROZOLE 1 MG PO TABS: 1.0000 mg | ORAL_TABLET | Freq: Every day | ORAL | 3 refills | Status: DC

## 2016-11-07 NOTE — Telephone Encounter (Signed)
Gave avs and calendar for march 2019 

## 2016-11-07 NOTE — Progress Notes (Signed)
[facility]  [phone] [phone]  Clinic Follow up Note   Patient Care Team: Via, Lennette Bihari, MD as PCP - General (Family Medicine) Juanito Doom, MD as Consulting Physician (Pulmonary Disease) Truitt Merle, MD as Consulting Physician (Hematology) Erroll Luna, MD as Consulting Physician (General Surgery) 11/07/2016  CHIEF COMPLAIN: Follow up breast cancer   Oncology History   Breast cancer of upper-inner quadrant of right female breast   Staging form: Breast, AJCC 7th Edition     Clinical: Stage Unknown (T1c, NX, cM0) - Signed by Glean Salvo, MD on 11/19/2013       Staging comments: ER 100%+, PR 100%+, Ki-67 7%        Breast cancer of upper-inner quadrant of right female breast ([place])   12/06/2012 Imaging    Bone Density performed at [facility] physicians T score Lumbar spine -0.1 (-0.7 in 2012), Right neck femur -1.4 (-1.2 before), Left neck femur -1.4 (-1.5 before) and Interpretation is OSTEOPENIA by WHO criteria.       10/11/2013 Mammogram    Abnormal Screening mammogram showing Right Breast mass.      10/23/2013 Breast US    Diagnostic mammogram and US showed an irregular hypoechoic mass at 9'0 clock position right breast 8 cm from nipple. [place] measurement 1.2x0.7x1.1 cm, no axillary adenopathy.      10/23/2013 Initial Biopsy    US guided biopsy  done with clip placement.      10/23/2013 Pathology Results    Invasive ductal carcinoma (IDC), DCIS, invasive cancer grade 2, ER 100%+, PR 100%+, Ki-67% 7%, HER-2/NEU by CISH No amplification, ratio of her2:cep17 1.00, average her2 copy number per cell 1.95 (HER 2 Negative tumor). Molecular Classification LUMINAL A.      11/01/2013 Surgery    Initial surgical evaluation by Dr Marcello Moores Cornett: "Not good surgical candidate because of pulmonary status". Recommended anti-estrogen therapy.      11/18/2013 -  Anti-estrogen oral therapy    Anastrozole 1 mg once daily      11/18/2013 -   Neo-Adjuvant Anti-estrogen oral therapy    Patient started on Arimidex today after initial evaluation (Dr Lona Kettle). Also discussed with Dr Lake Bells and Dr Brantley Stage to reconsider surgery after optimization of her pulmonary status, perhaps with Local anesthesia.      12/20/2013 Surgery    right breast lumpectomy without SLN biopsy, negative margins       12/20/2013 Pathologic Stage    pT1cNxMx, G2, LVI (-), tumor measures 1.2cm. Surgical margins are negative.        10/18/2016 Imaging    MM DIAG BREAST TOMO BILATERAL 10/18/16 IMPRESSION: No mammographic evidence of malignancy involving either breast. Expected post lumpectomy changes in the right breast.       CURRENT THERAPY: Anastrozole 23m daily, started on 11/18/2013  INTERVAL HISTORY: She returns for follow up. She is doing good overall. She has not had any new or recent admissions. She is still on 3L oxygen at home as before. She is still able to perform her daily activities as she would like at home. She continues to take Anastrozole and has tolerated this well. She denies any arthralgias, myalgias, neuropathy, hot flashes, or any other associated symptoms. She has f/u w/ her PCP tomorrow.    REVIEW OF SYSTEMS:   Constitutional: Denies fevers, chills or abnormal weight loss Eyes: Denies blurriness of vision Ears, nose, mouth, throat, and face: Denies mucositis or sore throat Respiratory:, no wheezes (+) SOB, chronic Cardiovascular: Denies palpitation, chest  discomfort or lower extremity swelling Gastrointestinal:  Denies nausea, heartburn or change in bowel habits Skin: Denies abnormal skin rashes Lymphatics: Denies new lymphadenopathy or easy bruising Neurological:Denies numbness, tingling or new weaknesses Behavioral/Psych: Mood is stable, no new changes  Muscloskeletal: (+) muscle soreness bilaterally All other systems were reviewed with the patient and are negative.  MEDICAL HISTORY:  Past Medical History:  Diagnosis Date    . Allergic rhinitis   . Arthritis   . Breast calcification seen on mammogram    Right breast  . Breast cancer ([place])   . COPD (chronic obstructive pulmonary disease) ([place])   . GERD (gastroesophageal reflux disease)   . Hyperglycemia   . Hyperlipidemia   . Hypertension   . Low back pain   . On home oxygen therapy    all the time  . Osteopenia   . Peripheral edema   . Shortness of breath   . Subclavian artery stenosis, left ([place])   . Vitamin D deficiency   . Wears dentures    top  . Wears glasses    reading    SURGICAL HISTORY: Past Surgical History:  Procedure Laterality Date  . BREAST BIOPSY    . BREAST LUMPECTOMY     right 2015  . BREAST LUMPECTOMY WITH RADIOACTIVE SEED LOCALIZATION Right 12/20/2013   Procedure: RIGHT BREAST LUMPECTOMY WITH RADIOACTIVE SEED LOCALIZATION;  Surgeon: Erroll Luna, MD;  Location: [facility];  Service: General;  Laterality: Right;  . CAROTID DUPLEX  2014  . CATARACT EXTRACTION     both eyes    I have reviewed the social history and family history with the patient and they are unchanged from previous note.  ALLERGIES:  has No Known Allergies.  MEDICATIONS:  Current Outpatient Prescriptions  Medication Sig Dispense Refill  . acetaminophen (TYLENOL) 325 MG tablet Take 650 mg by mouth every 6 (six) hours as needed.    Marland Kitchen albuterol (PROVENTIL HFA;VENTOLIN HFA) 108 (90 Base) MCG/ACT inhaler Inhale 2 puffs into the lungs every 6 (six) hours as needed for wheezing or shortness of breath. 18 g 11  . anastrozole (ARIMIDEX) 1 MG tablet Take 1 tablet (1 mg total) by mouth daily. 90 tablet 3  . aspirin 81 MG tablet Take 81 mg by mouth daily.    . Cholecalciferol (VITAMIN D) 2000 UNITS CAPS Take 1 capsule by mouth daily.    . Cyanocobalamin (VITAMIN B 12 PO) Take 1 tablet by mouth daily.    . DiphenhydrAMINE HCl (BENADRYL PO) Take 1 tablet by mouth as needed.    . fluticasone (FLONASE) 50 MCG/ACT nasal spray Place 2 sprays into both  nostrils at bedtime. 16 g 2  . furosemide (LASIX) 40 MG tablet Take 1 tablet (40 mg total) by mouth 2 (two) times daily. <PLEASE MAKE APPOINTMENT FOR REFILLS> 180 tablet 2  . KLOR-CON M20 20 MEQ tablet TAKE 1 TABLET BY MOUTH DAILY 90 tablet 2  . lisinopril (PRINIVIL,ZESTRIL) 40 MG tablet Take 40 mg by mouth daily.    Marland Kitchen lovastatin (MEVACOR) 40 MG tablet Take 40 mg by mouth at bedtime.    Marland Kitchen omeprazole (PRILOSEC) 20 MG capsule Take 20 mg by mouth daily.    . Tiotropium Bromide-Olodaterol (STIOLTO RESPIMAT) 2.5-2.5 MCG/ACT AERS Inhale 2 puffs into the lungs daily. 3 Inhaler 3   No current facility-administered medications for this visit.     PHYSICAL EXAMINATION:  ECOG PERFORMANCE STATUS: 2 - Symptomatic, <50% confined to bed VS reviewed  GENERAL:alert, no distress and  comfortable SKIN: skin color, texture, turgor are normal, no rashes or significant lesions EYES: normal, Conjunctiva are pink and non-injected, sclera clear OROPHARYNX:no exudate, no erythema and lips, buccal mucosa, and tongue normal  NECK: supple, thyroid normal size, non-tender, without nodularity LYMPH:  no palpable lymphadenopathy in the cervical, axillary or inguinal LUNGS: clear to auscultation and percussion with normal breathing effort HEART: regular rate & rhythm and no murmurs and no lower extremity edema ABDOMEN:abdomen soft, non-tender and normal bowel sounds Musculoskeletal:no cyanosis of digits and no clubbing  NEURO: alert & oriented x 3 with fluent speech, no focal motor/sensory deficits Breasts: Breast inspection showed them to be symmetrical with no nipple discharge. Palpation of the breasts and axilla revealed no obvious mass that I could appreciate. (+) erythema under breast    LABORATORY DATA:  I have reviewed the data as listed CBC Latest Ref Rng & Units 11/07/2016 08/04/2016 05/05/2016  WBC 3.9 - 10.3 10e3/uL 5.4 6.5 6.5  Hemoglobin 11.6 - 15.9 g/dL 9.6(L) 10.3(L) 10.5(L)  Hematocrit 34.8 - 46.6 %  31.0(L) 31.7(L) 33.8(L)  Platelets 145 - 400 10e3/uL 205 257.0 242     CMP Latest Ref Rng & Units 11/07/2016 05/05/2016 11/05/2015  Glucose 70 - 140 mg/dl 118 113 142(H)  BUN 7.0 - 26.0 mg/dL 28.2(H) 27.6(H) 21.2  Creatinine 0.6 - 1.1 mg/dL 1.3(H) 1.3(H) 1.2(H)  Sodium 136 - 145 mEq/L 142 142 143  Potassium 3.5 - 5.1 mEq/L 4.3 4.4 4.2  Chloride 96 - 112 mEq/L - - -  CO2 22 - 29 mEq/L 30(H) 31(H) 30(H)  Calcium 8.4 - 10.4 mg/dL 9.7 9.8 9.4  Total Protein 6.4 - 8.3 g/dL 7.3 7.6 7.1  Total Bilirubin 0.20 - 1.20 mg/dL 0.31 0.45 <0.30  Alkaline Phos 40 - 150 U/L 74 83 74  AST 5 - 34 U/L 12 11 12   ALT 0-55 U/L U/L <6 9 <9   PATHOLOGY REPORT  12/20/2013 Diagnosis 1. Breast, lumpectomy, Right - INVASIVE DUCTAL CARCINOMA, GRADE 2, SPANNING 1.2 CM. - DUCTAL CARCINOMA IN SITU. - BIOPSY SITE. - FINAL RESECTION MARGINS ARE NEGATIVE. - SEE ONCOLOGY TABLE. 2. Breast, excision, Right additional deep margin - BENIGN FIBROADIPOSE TISSUE. - NO MALIGNANCY IDENTIFIED. 3. Breast, excision, Right additional lateral margin - BENIGN BREAST TISSUE WITH FIBROCYSTIC CHANGE. - NO MALIGNANCY IDENTIFIED. 1 of 3 FINAL for ELESE, RANE A [phone]) Microscopic Comment 1. BREAST, INVASIVE TUMOR, WITH LYMPH NODES PRESENT Specimen, including laterality and lymph node sampling (sentinel, non-sentinel): Right breast lump. Procedure: Right breast lumpectomy. Histologic type: Invasive ductal carcinoma. Grade: 2 Tubule formation: 3 Nuclear pleomorphism: 2 Mitotic:1 Tumor size (gross measurement): 1.2 cm Margins: Invasive, distance to closest margin: Present at cauterized deep margin of original specimen. Additional deep margin (part #2) is negative. In-situ, distance to closest margin: Present at cauterized deep margin of original specimen. Additional deep margin (part #2) is negative. If margin positive, focally or broadly: Original is focal. Final is negative. Lymphovascular invasion:  Absent. Ductal carcinoma in situ: Present. Grade: II Extensive intraductal component: No. Lobular neoplasia: Absent. Tumor focality: Unifocal. Treatment effect: [name]. Extent of tumor: Confined to breast parenchyma. Lymph nodes: Not sampled. Examined: 0 Sentinel 0 Non-sentinel 0 Total Lymph nodes with metastasis: 0 Isolated tumor cells (< 0.2 mm): 0 Micrometastasis: (> 0.2 mm and < 2.0 mm): 0 Macrometastasis: (> 2.0 mm): 0 Extracapsular extension: 0 Breast prognostic profile: Performed on [id number] (results below). Her 2 neu will be repeated on current specimen. Estrogen receptor: Positive, strong intensity. Progesterone receptor: Positive,  strong intensity. Her 2 neu: Not amplified. Ki-67: 7%    RADIOGRAPHIC STUDIES: I have personally reviewed the radiological images as listed and agreed with the findings in the report.  MM DIAG BREAST TOMO BILATERAL 10/18/16 IMPRESSION: No mammographic evidence of malignancy involving either breast. Expected post lumpectomy changes in the right breast.  MM Diag breast tomo bilateral 10/15/2015 IMPRESSION: No mammographic evidence of malignancy  RECOMMENDATION: Annual diagnostic mammography  DEX 10/15/2015 ASSESSMENT: The BMD measured at Femur Neck Right is 0.802 g/cm2 with a T-score of -1.7.  This patient is considered OSTEOPENIC according to [facility]) criteria.   FRAX* 10-year Probability of Fracture Based on femoral neck BMD: DualFemur (Right)  Major Osteoporotic Fracture: 13.3% Hip Fracture:                3.3% Population:                  [place] (Caucasian) Risk Factors:                Secondary Osteoporosis  ASSESSMENT & PLAN:  83 y.o. female with past medical history significant for COPD, on continuous 3 L/m oxygen, who was diagnosed with T1 cN0 M0, stage I a right breast invasive ductal carcinoma, status post lumpectomy.  1. Right breast invasive ductal carcinoma, T1 cN0 M0, stage Ia, G2, ER  100% positive, PR 100% positive, HER-2 negative. -She initially was thought not a surgical candidate, but did tolerate lumpectomy with local anesthesia well. Her surgical margin was negative. -I recommend her continue adjuvant aromatase inhibitor anastrozole for total 5 years, to reduce the risks of cancer recurrence in the future. -Giving her advanced age and severe comorbidity, and early stage breast cancer, I do not recommend adjuvant chemotherapy. Giving the strong ER/PR positive, low Ki-67, negative HER-2/neu, I think that her recurrence score would be low even we did Oncotype. -Her case was presented in our breast tumor board before and adjuvant RT was not recommended. -She is clinically doing well, examined unremarkable, her last mammogram in August 2017 was normal. Her lab reviewed, which are unremarkable except stable elevated Cr, no evidence of recurrence. Her anemia has slightly worsened to 9.6, I informed her that if it were to fall below 9 that we could give her an EPO injection to assist with this.  -We'll continue surveillance, I previously encouraged her to do self exam. Also encouraged her to be physically active. - We will continue anastrozole, she is tolerating well overall, mild hot flush, and leg soreness, manageable, I encouraged her exercise - Mammogram recently performed  On 10/18/16 as was normal.  -Encouraged her to have her flu shot, she will have this performed at her PCP's office at her request.   2. Anemia of chronic disease  -She has mild normocytic anemia, which has not recovered since the surgery.  -Her anemia workup including folic acid, O17, ferritin and iron level were normal.  -SPEP was negative -This is likely anemia of chronic disease, secondary to her CKD -Hemoglobin 9.6, overall stable, she is not symptomatic, we'll continue monitoring. If this were to fall below 9 I informed her that we could aid with this by providing and EPO injection. We will continue to  monitor in the interim.   3. Osteopenia  -Her repeated bone density scan showed osteopenia, high risk for fracture (10 year risk of hip fracture 3.3%) -Anastrozole can cause osteoporosis or worsening osteopenia. -She will continue vitamin D 2000 units daily. She has difficulty  take large pill and is not taking calcium, I encouraged her to try chewable calcium pill -We previously discussed the benefit of biphosphonate and or Prolia, due to her high-risk fracture. She is not interested. -I previously encouraged her to exercise, such as bike or weight bearing exercise  -Next bone density scan in August 2019  4. Severe COPD, on home oxygen 3L/min -She is followed with her pulmonologist Dr. Lake Bells. No recent change in O2 therapy.   5. CKD. -Her creatinine was 1.3 previously, stable. -She will continue follow-up with her primary care physician  Plan  -continue anastrozole -return to clinic in 6 months with lab and f/u -she will have flu shot at PCP office soon    All questions were answered. The patient knows to call the clinic with any problems, questions or concerns. No barriers to learning was detected.  I spent 20 minutes counseling the patient face to face. The total time spent in the appointment was 30 minutes and more than 50% was on counseling and review of test results   This document serves as a record of services personally performed by Truitt Merle, MD. It was created on her behalf by Reola Mosher, a trained medical scribe. The creation of this record is based on the scribe's personal observations and the provider's statements to them. This document has been checked and approved by the attending provider.   Truitt Merle, MD 11/07/16 3:36 PM

## 2016-11-07 NOTE — Telephone Encounter (Signed)
Pt called to cx appt on 9/25- offered to rescheduled to 9/26 but pt could not make this appt either.  BQ is opening clinic days in October- will hold message in triage until clinic days are open.

## 2016-11-08 ENCOUNTER — Ambulatory Visit: Payer: Medicare Other | Admitting: Pulmonary Disease

## 2016-11-08 LAB — VITAMIN D 25 HYDROXY (VIT D DEFICIENCY, FRACTURES): VIT D 25 HYDROXY: 28 ng/mL — AB (ref 30.0–100.0)

## 2016-11-08 NOTE — Telephone Encounter (Signed)
Called and spoke with pt and she is aware of the appt on 10/24 with BQ

## 2016-11-14 ENCOUNTER — Telehealth: Payer: Self-pay | Admitting: *Deleted

## 2016-11-14 NOTE — Telephone Encounter (Signed)
Phone call to inform pt that her vitD level was slightly low, please increase VitD 2000u to 4000u for 3 months, and then back to 2000.  Pt stated that she would comply.

## 2016-11-14 NOTE — Telephone Encounter (Signed)
Received message from [facility] that they are closed & to contact pt to inquire who their new PCP is.  Called pt & Dr Leeroy Cha is her new PCP.  Routed Dr Ernestina Penna last note to her.

## 2016-12-06 ENCOUNTER — Encounter: Payer: Self-pay | Admitting: Pulmonary Disease

## 2016-12-06 ENCOUNTER — Ambulatory Visit (INDEPENDENT_AMBULATORY_CARE_PROVIDER_SITE_OTHER): Payer: Medicare Other | Admitting: Pulmonary Disease

## 2016-12-06 VITALS — BP 136/58 | HR 106 | Ht 61.0 in | Wt 184.0 lb

## 2016-12-06 DIAGNOSIS — J9611 Chronic respiratory failure with hypoxia: Secondary | ICD-10-CM

## 2016-12-06 DIAGNOSIS — J411 Mucopurulent chronic bronchitis: Secondary | ICD-10-CM

## 2016-12-06 NOTE — Progress Notes (Signed)
Subjective:    Patient ID: Courtney Cline, female    DOB: 30-Sep-1933, 83 y.o.   MRN: [id number]  Synopsis: GOLD Grade D COPD In 2018 she had multiple exacerbations of COPD  HPI  Chief Complaint  Patient presents with  . Follow-up    doing well, notes stable sob with exertion   Last seen June, sick recurrent exacerbations, change to trelegy (samples) and roflumilast.  She took the Trelegy but she says it didn't help her at all.  She didn't take the roflumilast because of the side effects.    Since the last visit she has been doing OK, no flares.  Minimal cough, but she says it is productive of something like a small amount of mucus 1-2 times per day. Mucus is clear.  She says that her shortness of breath but itsn't not worse since the last visit.  She says that she has to use her rescue inhaler about every 5 hours or so.   She continues to use and benefit from 3 L O2 continuously.    Past Medical History:  Diagnosis Date  . Allergic rhinitis   . Arthritis   . Breast calcification seen on mammogram    Right breast  . Breast cancer ([place])   . COPD (chronic obstructive pulmonary disease) ([place])   . GERD (gastroesophageal reflux disease)   . Hyperglycemia   . Hyperlipidemia   . Hypertension   . Low back pain   . On home oxygen therapy    all the time  . Osteopenia   . Peripheral edema   . Shortness of breath   . Subclavian artery stenosis, left ([place])   . Vitamin D deficiency   . Wears dentures    top  . Wears glasses    reading     Review of Systems  Constitutional: Negative for chills, fatigue and fever.  HENT: Negative for postnasal drip, rhinorrhea and sinus pain.   Respiratory: Positive for shortness of breath. Negative for cough and wheezing.   Cardiovascular: Negative for palpitations and leg swelling.       Objective:   Physical Exam Vitals:   12/06/16 1338  BP: (!) 136/58  Pulse: (!) 106  SpO2: 95%  Weight: 184 lb (83.5 kg)  Height: 5\' 1"  (1.549  m)   3L O2  Gen: chronically ill  appearing HENT: OP clear, TM's clear, neck supple PULM: CTA B, normal percussion CV: RRR, no mgr, trace edema GI: BS+, soft, nontender Derm: no cyanosis or rash Psyche: normal mood and affect   [place]: 2015 FEV1 33% pred, on 2L O2 continuously  Chest imaging: 05/2016 CXR images independently reviewed: Hyperinflation consistent with COPD, no pulmonary parenchymal abnormality  CBC    Component Value Date/Time   WBC 5.4 11/07/2016 0932   WBC 6.5 08/04/2016 1156   RBC 3.21 (L) 11/07/2016 0932   RBC 3.51 (L) 08/04/2016 1156   HGB 9.6 (L) 11/07/2016 0932   HCT 31.0 (L) 11/07/2016 0932   PLT 205 11/07/2016 0932   MCV 96.6 11/07/2016 0932   MCH 29.9 11/07/2016 0932   MCH 30.7 12/18/2013 0022   MCHC 31.0 (L) 11/07/2016 0932   MCHC 32.4 08/04/2016 1156   RDW 13.4 11/07/2016 0932   LYMPHSABS 1.3 11/07/2016 0932   MONOABS 0.4 11/07/2016 0932   EOSABS 0.2 11/07/2016 0932   BASOSABS 0.0 11/07/2016 0932   07/2016 IgE 242     Assessment & Plan:   Chronic hypoxemic respiratory failure (HCC)  Mucopurulent  chronic bronchitis ([place])   This has been a stable interval for [name].  Since the last exacerbation she has not been sick.  For the last 3-4 months she has been doing quite well.  She did not take the Reflumilast I recommended.  At this point I do not see a reason to change her medicines.  That all being said, back when she was sick she had evidence of an allergic reaction with an elevated serum IgE.  This would raise concern for something like allergic bronchopulmonary aspergillosis or another allergic reaction.  Should she have another exacerbation of COPD I would like for her to come back and see [place] as soon as possible, will have a CT of her chest, mucous sampled for bacteria, fungus, AFB cultures.  Plan: For COPD: Keep taking Stiolto as you are doing, 2 puffs per day I am glad your flu shot is up-to-date Keep using albuterol as needed for  chest tightness wheezing or shortness of breath Stay active, exercise as much as possible  Chronic respiratory failure with hypoxemia: Continue using 3 L of oxygen continuously  We will see you back in 3-4 months or sooner if needed  Updated Medication List Outpatient Encounter Prescriptions as of 12/06/2016  Medication Sig  . acetaminophen (TYLENOL) 325 MG tablet Take 650 mg by mouth every 6 (six) hours as needed.  Marland Kitchen albuterol (PROVENTIL HFA;VENTOLIN HFA) 108 (90 Base) MCG/ACT inhaler Inhale 2 puffs into the lungs every 6 (six) hours as needed for wheezing or shortness of breath.  . anastrozole (ARIMIDEX) 1 MG tablet Take 1 tablet (1 mg total) by mouth daily.  Marland Kitchen aspirin 81 MG tablet Take 81 mg by mouth daily.  . Cholecalciferol (VITAMIN D) 2000 UNITS CAPS Take 1 capsule by mouth daily.  . Cyanocobalamin (VITAMIN B 12 PO) Take 1 tablet by mouth daily.  . DiphenhydrAMINE HCl (BENADRYL PO) Take 1 tablet by mouth as needed.  . fluticasone (FLONASE) 50 MCG/ACT nasal spray Place 2 sprays into both nostrils at bedtime.  . furosemide (LASIX) 40 MG tablet Take 1 tablet (40 mg total) by mouth 2 (two) times daily. <PLEASE MAKE APPOINTMENT FOR REFILLS>  . KLOR-CON M20 20 MEQ tablet TAKE 1 TABLET BY MOUTH DAILY  . lisinopril (PRINIVIL,ZESTRIL) 40 MG tablet Take 40 mg by mouth daily.  Marland Kitchen lovastatin (MEVACOR) 40 MG tablet Take 40 mg by mouth at bedtime.  Marland Kitchen omeprazole (PRILOSEC) 20 MG capsule Take 20 mg by mouth daily.  . Tiotropium Bromide-Olodaterol (STIOLTO RESPIMAT) 2.5-2.5 MCG/ACT AERS Inhale 2 puffs into the lungs daily.   No facility-administered encounter medications on file as of 12/06/2016.

## 2016-12-06 NOTE — Patient Instructions (Signed)
For COPD: Keep taking Stiolto as you are doing, 2 puffs per day I am glad your flu shot is up-to-date Keep using albuterol as needed for chest tightness wheezing or shortness of breath Stay active, exercise as much as possible  Chronic respiratory failure with hypoxemia: Continue using 3 L of oxygen continuously  We will see you back in 3-4 months or sooner if needed

## 2016-12-07 ENCOUNTER — Ambulatory Visit: Payer: Medicare Other | Admitting: Pulmonary Disease

## 2017-01-02 ENCOUNTER — Telehealth: Payer: Self-pay | Admitting: Pulmonary Disease

## 2017-01-02 MED ORDER — TIOTROPIUM BROMIDE-OLODATEROL 2.5-2.5 MCG/ACT IN AERS
2.0000 | INHALATION_SPRAY | Freq: Every day | RESPIRATORY_TRACT | 0 refills | Status: DC
Start: 2017-01-02 — End: 2017-06-15

## 2017-01-02 NOTE — Telephone Encounter (Signed)
2 samples of the stiolto have been placed up front and the pt is aware of this. Nothing further is needed.

## 2017-02-08 ENCOUNTER — Telehealth: Payer: Self-pay | Admitting: Pulmonary Disease

## 2017-02-08 ENCOUNTER — Other Ambulatory Visit: Payer: Self-pay | Admitting: Pulmonary Disease

## 2017-02-08 NOTE — Telephone Encounter (Signed)
Spoke with patient. Advised her that we do not have samples of Stiolto at this time. Advised patient to check back next week. She verbalized understanding. Nothing else needed at time of call.

## 2017-03-24 DIAGNOSIS — E559 Vitamin D deficiency, unspecified: Secondary | ICD-10-CM | POA: Insufficient documentation

## 2017-05-08 ENCOUNTER — Other Ambulatory Visit: Payer: Medicare Other

## 2017-05-08 ENCOUNTER — Telehealth: Payer: Self-pay | Admitting: Hematology

## 2017-05-08 ENCOUNTER — Ambulatory Visit: Payer: Medicare Other | Admitting: Hematology

## 2017-05-08 NOTE — Telephone Encounter (Signed)
Patient called to reschedule she is sick

## 2017-05-10 ENCOUNTER — Other Ambulatory Visit: Payer: Self-pay | Admitting: Pulmonary Disease

## 2017-05-18 ENCOUNTER — Other Ambulatory Visit: Payer: Self-pay

## 2017-05-18 ENCOUNTER — Ambulatory Visit: Payer: Self-pay | Admitting: Hematology

## 2017-06-14 NOTE — Progress Notes (Signed)
[place]  Telephone:([phone] [phone]  Clinic Follow up Note   Patient Care Team: Via, Lennette Bihari, MD as PCP - General (Family Medicine) Juanito Doom, MD as Consulting Physician (Pulmonary Disease) Truitt Merle, MD as Consulting Physician (Hematology) Erroll Luna, MD as Consulting Physician (General Surgery) 06/15/2017  CHIEF COMPLAIN: Follow up breast cancer   Oncology History   Breast cancer of upper-inner quadrant of right female breast   Staging form: Breast, AJCC 7th Edition     Clinical: Stage Unknown (T1c, NX, cM0) - Signed by Glean Salvo, MD on 11/19/2013       Staging comments: ER 100%+, PR 100%+, Ki-67 7%        Breast cancer of upper-inner quadrant of right female breast ([place])   12/06/2012 Imaging    Bone Density performed at [facility] physicians T score Lumbar spine -0.1 (-0.7 in 2012), Right neck femur -1.4 (-1.2 before), Left neck femur -1.4 (-1.5 before) and Interpretation is OSTEOPENIA by WHO criteria.       10/11/2013 Mammogram    Abnormal Screening mammogram showing Right Breast mass.      10/23/2013 Breast US    Diagnostic mammogram and US showed an irregular hypoechoic mass at 9'0 clock position right breast 8 cm from nipple. [place] measurement 1.2x0.7x1.1 cm, no axillary adenopathy.      10/23/2013 Initial Biopsy    US guided biopsy  done with clip placement.      10/23/2013 Pathology Results    Invasive ductal carcinoma (IDC), DCIS, invasive cancer grade 2, ER 100%+, PR 100%+, Ki-67% 7%, HER-2/NEU by CISH No amplification, ratio of her2:cep17 1.00, average her2 copy number per cell 1.95 (HER 2 Negative tumor). Molecular Classification LUMINAL A.      11/01/2013 Surgery    Initial surgical evaluation by Dr Marcello Moores Cornett: "Not good surgical candidate because of pulmonary status". Recommended anti-estrogen therapy.      11/18/2013 -  Anti-estrogen oral therapy    Anastrozole 1 mg once daily      11/18/2013 -   Neo-Adjuvant Anti-estrogen oral therapy    Patient started on Arimidex today after initial evaluation (Dr Lona Kettle). Also discussed with Dr Lake Bells and Dr Brantley Stage to reconsider surgery after optimization of her pulmonary status, perhaps with Local anesthesia.      12/20/2013 Surgery    right breast lumpectomy without SLN biopsy, negative margins       12/20/2013 Pathologic Stage    pT1cNxMx, G2, LVI (-), tumor measures 1.2cm. Surgical margins are negative.        10/18/2016 Imaging    MM DIAG BREAST TOMO BILATERAL 10/18/16 IMPRESSION: No mammographic evidence of malignancy involving either breast. Expected post lumpectomy changes in the right breast.       CURRENT THERAPY: Anastrozole 31m daily, started on 11/18/2013  INTERVAL HISTORY: She returns for follow up and presents to the office by herself today. She continues on anastrozole, with good tolerance. She is doing well overall.    Labs today, 06/15/17 (CBC, CMP, and reticulocytes) showed: CMP with CO2 at 31, BUN at 31, Cr at 1.45, GFR non af am at 33, and GFR at 38, otherwise WNL. CBC showed: Hgb at 9.5, RBC at 3.19, HCT at 30.7, and MCHC at 30.9, otherwise WNL. Reticulocytes showed RBC at 3.19 otherwise WNL.   On review of systems, she reports redness underneath her breast and right breast soreness. She treats this with cornstarch PRN. She has been on at home oxygen since her cancer diagnosis  and she uses 3L oxygen. She does have a pulmonologist. She denies any other symptoms. She doesn't drive and her husband does most of the driving for her.    REVIEW OF SYSTEMS:  Constitutional: Denies fevers, chills or abnormal weight loss Eyes: Denies blurriness of vision Ears, nose, mouth, throat, and face: Denies mucositis or sore throat Respiratory:, no wheezes (+) SOB, chronic Cardiovascular: Denies palpitation, chest discomfort or lower extremity swelling Gastrointestinal:  Denies nausea, heartburn or change in bowel habits Skin: Denies  abnormal skin rashes Lymphatics: Denies new lymphadenopathy or easy bruising Neurological:Denies numbness, tingling or new weaknesses Behavioral/Psych: Mood is stable, no new changes  Muscloskeletal: (+) muscle soreness bilaterally All other systems were reviewed with the patient and are negative.  MEDICAL HISTORY:  Past Medical History:  Diagnosis Date  . Allergic rhinitis   . Arthritis   . Breast calcification seen on mammogram    Right breast  . Breast cancer ([place])   . COPD (chronic obstructive pulmonary disease) ([place])   . GERD (gastroesophageal reflux disease)   . Hyperglycemia   . Hyperlipidemia   . Hypertension   . Low back pain   . On home oxygen therapy    all the time  . Osteopenia   . Peripheral edema   . Shortness of breath   . Subclavian artery stenosis, left ([place])   . Vitamin D deficiency   . Wears dentures    top  . Wears glasses    reading    SURGICAL HISTORY: Past Surgical History:  Procedure Laterality Date  . BREAST BIOPSY    . BREAST LUMPECTOMY     right 2015  . BREAST LUMPECTOMY WITH RADIOACTIVE SEED LOCALIZATION Right 12/20/2013   Procedure: RIGHT BREAST LUMPECTOMY WITH RADIOACTIVE SEED LOCALIZATION;  Surgeon: Erroll Luna, MD;  Location: [facility];  Service: General;  Laterality: Right;  . CAROTID DUPLEX  2014  . CATARACT EXTRACTION     both eyes    I have reviewed the social history and family history with the patient and they are unchanged from previous note.  ALLERGIES:  has No Known Allergies.  MEDICATIONS:  Current Outpatient Medications  Medication Sig Dispense Refill  . acetaminophen (TYLENOL) 325 MG tablet Take 650 mg by mouth every 6 (six) hours as needed.    Marland Kitchen albuterol (PROVENTIL HFA;VENTOLIN HFA) 108 (90 Base) MCG/ACT inhaler Inhale 2 puffs into the lungs every 6 (six) hours as needed for wheezing or shortness of breath. 18 g 11  . anastrozole (ARIMIDEX) 1 MG tablet Take 1 tablet (1 mg total) by mouth daily. 90  tablet 3  . aspirin 81 MG tablet Take 81 mg by mouth daily.    . Cholecalciferol (VITAMIN D) 2000 UNITS CAPS Take 1 capsule by mouth daily.    . Cyanocobalamin (VITAMIN B 12 PO) Take 1 tablet by mouth daily.    . DiphenhydrAMINE HCl (BENADRYL PO) Take 1 tablet by mouth as needed.    . furosemide (LASIX) 40 MG tablet Take 1 tablet (40 mg total) by mouth 2 (two) times daily. <PLEASE MAKE APPOINTMENT FOR REFILLS> 180 tablet 2  . KLOR-CON M20 20 MEQ tablet TAKE 1 TABLET BY MOUTH DAILY 90 tablet 2  . lisinopril (PRINIVIL,ZESTRIL) 40 MG tablet Take 40 mg by mouth daily.    Marland Kitchen lovastatin (MEVACOR) 40 MG tablet Take 40 mg by mouth at bedtime.    Marland Kitchen omeprazole (PRILOSEC) 20 MG capsule Take 20 mg by mouth daily.    Marland Kitchen STIOLTO  RESPIMAT 2.5-2.5 MCG/ACT AERS INHALE 2 PUFFS INTO THE LUNGS DAILY. 1 Inhaler 5   No current facility-administered medications for this visit.     PHYSICAL EXAMINATION:  ECOG PERFORMANCE STATUS: 2 - Symptomatic, <50% confined to bed BP (!) 136/55 (BP Location: Right Arm, Patient Position: Sitting)   Pulse 92   Temp 98.6 F (37 C) (Oral)   Resp 20   Ht 5' 1"  (1.549 m)   Wt 186 lb 6.4 oz (84.6 kg)   SpO2 99% Comment: Pt on 3 lpm via [place]  BMI 35.22 kg/m   GENERAL:alert, no distress and comfortable SKIN: skin color, texture, turgor are normal, no rashes or significant lesions EYES: normal, Conjunctiva are pink and non-injected, sclera clear OROPHARYNX:no exudate, no erythema and lips, buccal mucosa, and tongue normal  NECK: supple, thyroid normal size, non-tender, without nodularity LYMPH:  no palpable lymphadenopathy in the cervical, axillary or inguinal LUNGS: clear to auscultation and percussion with normal breathing effort HEART: regular rate & rhythm and no murmurs and no lower extremity edema ABDOMEN:abdomen soft, non-tender and normal bowel sounds Musculoskeletal:no cyanosis of digits and no clubbing  NEURO: alert & oriented x 3 with fluent speech, no focal  motor/sensory deficits Breasts: Breast inspection showed them to be symmetrical with no nipple discharge. Palpation of the breasts and axilla revealed no obvious mass that I could appreciate. (+) skin erythema under right breast    LABORATORY DATA:  I have reviewed the data as listed CBC Latest Ref Rng & Units 06/15/2017 11/07/2016 08/04/2016  WBC 3.9 - 10.3 K/uL 6.7 5.4 6.5  Hemoglobin 11.6 - 15.9 g/dL 9.5(L) 9.6(L) 10.3(L)  Hematocrit 34.8 - 46.6 % 30.7(L) 31.0(L) 31.7(L)  Platelets 145 - 400 K/uL 187 205 257.0     CMP Latest Ref Rng & Units 06/15/2017 11/07/2016 05/05/2016  Glucose 70 - 140 mg/dL 120 118 113  BUN 7 - 26 mg/dL 31(H) 28.2(H) 27.6(H)  Creatinine 0.60 - 1.10 mg/dL 1.45(H) 1.3(H) 1.3(H)  Sodium 136 - 145 mmol/L 142 142 142  Potassium 3.5 - 5.1 mmol/L 4.6 4.3 4.4  Chloride 98 - 109 mmol/L 102 - -  CO2 22 - 29 mmol/L 31(H) 30(H) 31(H)  Calcium 8.4 - 10.4 mg/dL 10.1 9.7 9.8  Total Protein 6.4 - 8.3 g/dL 7.4 7.3 7.6  Total Bilirubin 0.2 - 1.2 mg/dL 0.3 0.31 0.45  Alkaline Phos 40 - 150 U/L 78 74 83  AST 5 - 34 U/L 14 12 11   ALT 0 - 55 U/L 9 <6 9   PATHOLOGY REPORT  12/20/2013 Diagnosis 1. Breast, lumpectomy, Right - INVASIVE DUCTAL CARCINOMA, GRADE 2, SPANNING 1.2 CM. - DUCTAL CARCINOMA IN SITU. - BIOPSY SITE. - FINAL RESECTION MARGINS ARE NEGATIVE. - SEE ONCOLOGY TABLE. 2. Breast, excision, Right additional deep margin - BENIGN FIBROADIPOSE TISSUE. - NO MALIGNANCY IDENTIFIED. 3. Breast, excision, Right additional lateral margin - BENIGN BREAST TISSUE WITH FIBROCYSTIC CHANGE. - NO MALIGNANCY IDENTIFIED. 1 of 3 FINAL for TERE, MCCONAUGHEY A [phone]) Microscopic Comment 1. BREAST, INVASIVE TUMOR, WITH LYMPH NODES PRESENT Specimen, including laterality and lymph node sampling (sentinel, non-sentinel): Right breast lump. Procedure: Right breast lumpectomy. Histologic type: Invasive ductal carcinoma. Grade: 2 Tubule formation: 3 Nuclear pleomorphism:  2 Mitotic:1 Tumor size (gross measurement): 1.2 cm Margins: Invasive, distance to closest margin: Present at cauterized deep margin of original specimen. Additional deep margin (part #2) is negative. In-situ, distance to closest margin: Present at cauterized deep margin of original specimen. Additional deep margin (part #2) is negative. If  margin positive, focally or broadly: Original is focal. Final is negative. Lymphovascular invasion: Absent. Ductal carcinoma in situ: Present. Grade: II Extensive intraductal component: No. Lobular neoplasia: Absent. Tumor focality: Unifocal. Treatment effect: [name]. Extent of tumor: Confined to breast parenchyma. Lymph nodes: Not sampled. Examined: 0 Sentinel 0 Non-sentinel 0 Total Lymph nodes with metastasis: 0 Isolated tumor cells (< 0.2 mm): 0 Micrometastasis: (> 0.2 mm and < 2.0 mm): 0 Macrometastasis: (> 2.0 mm): 0 Extracapsular extension: 0 Breast prognostic profile: Performed on [id number] (results below). Her 2 neu will be repeated on current specimen. Estrogen receptor: Positive, strong intensity. Progesterone receptor: Positive, strong intensity. Her 2 neu: Not amplified. Ki-67: 7%    RADIOGRAPHIC STUDIES: I have personally reviewed the radiological images as listed and agreed with the findings in the report.  MM DIAG BREAST TOMO BILATERAL 10/18/16 IMPRESSION: No mammographic evidence of malignancy involving either breast. Expected post lumpectomy changes in the right breast.  MM Diag breast tomo bilateral 10/15/2015 IMPRESSION: No mammographic evidence of malignancy  RECOMMENDATION: Annual diagnostic mammography  DEX 10/15/2015 ASSESSMENT: The BMD measured at Femur Neck Right is 0.802 g/cm2 with a T-score of -1.7.  This patient is considered OSTEOPENIC according to [facility]) criteria.   FRAX* 10-year Probability of Fracture Based on femoral neck BMD: DualFemur (Right)  Major  Osteoporotic Fracture: 13.3% Hip Fracture:                3.3% Population:                  [place] (Caucasian) Risk Factors:                Secondary Osteoporosis  ASSESSMENT & PLAN:  84 y.o. female with past medical history significant for COPD, on continuous 3 L/m oxygen, who was diagnosed with T1 cN0 M0, stage I a right breast invasive ductal carcinoma, status post lumpectomy.  1. Right breast invasive ductal carcinoma, T1 cN0 M0, stage Ia, G2, ER 100% positive, PR 100% positive, HER-2 negative. -She initially was thought not a surgical candidate, but did tolerate lumpectomy with local anesthesia well. Her surgical margin was negative. -I recommend her continue adjuvant aromatase inhibitor anastrozole for total 5 years, to reduce the risks of cancer recurrence in the future. -Giving her advanced age and severe comorbidity, and early stage breast cancer, I do not recommend adjuvant chemotherapy. Giving the strong ER/PR positive, low Ki-67, negative HER-2/neu, I think that her recurrence score would be low even we did Oncotype. -Her case was presented in our breast tumor board before and adjuvant RT was not recommended. -We'll continue surveillance, I previously encouraged her to do self exam. Also encouraged her to be physically active. - We will continue anastrozole, she is tolerating well overall, mild hot flush, and leg soreness, manageable, I encouraged her exercise - Mammogram recently performed  On 10/18/16 as was normal.  -Discussed labs today, 06/15/17 (CBC, CMP, and reticulocytes) with the patient that showed: CMP with CO2 at 31, BUN at 31, Cr at 1.45, GFR non af am at 33, and GFR at 38, otherwise WNL. CBC showed: Hgb at 9.5, RBC at 3.19, HCT at 30.7, and MCHC at 30.9, otherwise WNL. Reticulocytes showed RBC at 3.19 otherwise WNL.  -I will schedule and order a mammogram to be completed in September 2019 -Continue anastrozole, until 11/2018 to complete 5 years therapy  -Lab and f/u in a  year, she will see her primary care physician in the interim -Mamm[facility] and D[facility]A  in 10/[facility]    2. Anemia of chronic disease  -She has mild normocytic anemia, which has not recovered since the surgery.  -Her anemia workup including folic acid, O72, ferritin and iron level were normal.  -SPEP was negative -This is likely anemia of chronic disease, secondary to her CKD -Hemoglobin 9.6, overall stable, she is not symptomatic, we'll continue monitoring. If this were to fall below 9 I informed her that we could aid with this by providing and EPO injection. We will continue to monitor in the interim.  -hgb at 9.5 today, 06/14/17 .  -Advised the patient to maintain follow up with her PCP.   3. Osteopenia  -Her repeated bone density scan showed osteopenia, high risk for fracture (10 year risk of hip fracture 3.3%) -Anastrozole can cause osteoporosis or worsening osteopenia. -She will continue vitamin D 2000 units daily. She has difficulty take large pill and is not taking calcium, I encouraged her to try chewable calcium pill -We previously discussed the benefit of biphosphonate and or Prolia, due to her high-risk fracture. She is not interested. -I previously encouraged her to exercise, such as bike or weight bearing exercise  -Next bone density scan in September 2019  4. Severe COPD, on home oxygen 3L/min -She is followed with her pulmonologist Dr. Lake Bells. No recent change in O2 therapy.   5. CKD. -Her creatinine was 1.3 previously, stable. -Cr at 1.45 today, 06/15/2017, stable -She will continue follow-up with her primary care physician  Plan  -Continue anastrozole  -Lab and f/u in a year  -Mammogram and [facility] in 10/2017 at [facility]     All questions were answered. The patient knows to call the clinic with any problems, questions or concerns. No barriers to learning was detected.  I spent 15 minutes counseling the patient face to face. The total time spent in the appointment was 20 minutes  and more than 50% was on counseling and review of test results  This document serves as a record of services personally performed by Truitt Merle, MD. It was created on her behalf by Steva Colder, a trained medical scribe. The creation of this record is based on the scribe's personal observations and the provider's statements to them.   I have reviewed the above documentation for accuracy and completeness, and I agree with the above.     Truitt Merle, MD 06/15/17 11:24 PM

## 2017-06-15 ENCOUNTER — Telehealth: Payer: Self-pay

## 2017-06-15 ENCOUNTER — Inpatient Hospital Stay: Payer: PPO | Attending: Hematology

## 2017-06-15 ENCOUNTER — Inpatient Hospital Stay (HOSPITAL_BASED_OUTPATIENT_CLINIC_OR_DEPARTMENT_OTHER): Payer: PPO | Admitting: Hematology

## 2017-06-15 VITALS — BP 136/55 | HR 92 | Temp 98.6°F | Resp 20 | Ht 61.0 in | Wt 186.4 lb

## 2017-06-15 DIAGNOSIS — E559 Vitamin D deficiency, unspecified: Secondary | ICD-10-CM | POA: Insufficient documentation

## 2017-06-15 DIAGNOSIS — Z79899 Other long term (current) drug therapy: Secondary | ICD-10-CM | POA: Diagnosis not present

## 2017-06-15 DIAGNOSIS — I10 Essential (primary) hypertension: Secondary | ICD-10-CM

## 2017-06-15 DIAGNOSIS — I129 Hypertensive chronic kidney disease with stage 1 through stage 4 chronic kidney disease, or unspecified chronic kidney disease: Secondary | ICD-10-CM | POA: Diagnosis not present

## 2017-06-15 DIAGNOSIS — Z9981 Dependence on supplemental oxygen: Secondary | ICD-10-CM

## 2017-06-15 DIAGNOSIS — M8589 Other specified disorders of bone density and structure, multiple sites: Secondary | ICD-10-CM

## 2017-06-15 DIAGNOSIS — J449 Chronic obstructive pulmonary disease, unspecified: Secondary | ICD-10-CM

## 2017-06-15 DIAGNOSIS — Z170 Estrogen receptor positive status [ER+]: Secondary | ICD-10-CM | POA: Insufficient documentation

## 2017-06-15 DIAGNOSIS — E785 Hyperlipidemia, unspecified: Secondary | ICD-10-CM

## 2017-06-15 DIAGNOSIS — K219 Gastro-esophageal reflux disease without esophagitis: Secondary | ICD-10-CM | POA: Diagnosis not present

## 2017-06-15 DIAGNOSIS — D638 Anemia in other chronic diseases classified elsewhere: Secondary | ICD-10-CM | POA: Insufficient documentation

## 2017-06-15 DIAGNOSIS — Z79811 Long term (current) use of aromatase inhibitors: Secondary | ICD-10-CM

## 2017-06-15 DIAGNOSIS — Z7982 Long term (current) use of aspirin: Secondary | ICD-10-CM | POA: Insufficient documentation

## 2017-06-15 DIAGNOSIS — C50211 Malignant neoplasm of upper-inner quadrant of right female breast: Secondary | ICD-10-CM

## 2017-06-15 DIAGNOSIS — M8580 Other specified disorders of bone density and structure, unspecified site: Secondary | ICD-10-CM

## 2017-06-15 LAB — CMP (CANCER CENTER ONLY)
ALBUMIN: 3.7 g/dL (ref 3.5–5.0)
ALT: 9 U/L (ref 0–55)
ANION GAP: 9 (ref 3–11)
AST: 14 U/L (ref 5–34)
Alkaline Phosphatase: 78 U/L (ref 40–150)
BUN: 31 mg/dL — AB (ref 7–26)
CHLORIDE: 102 mmol/L (ref 98–109)
CO2: 31 mmol/L — ABNORMAL HIGH (ref 22–29)
Calcium: 10.1 mg/dL (ref 8.4–10.4)
Creatinine: 1.45 mg/dL — ABNORMAL HIGH (ref 0.60–1.10)
GFR, EST NON AFRICAN AMERICAN: 33 mL/min — AB (ref >60)
GFR, Est AFR Am: 38 mL/min — ABNORMAL LOW (ref >60)
Glucose, Bld: 120 mg/dL (ref 70–140)
POTASSIUM: 4.6 mmol/L (ref 3.5–5.1)
Sodium: 142 mmol/L (ref 136–145)
Total Bilirubin: 0.3 mg/dL (ref 0.2–1.2)
Total Protein: 7.4 g/dL (ref 6.4–8.3)

## 2017-06-15 LAB — RETICULOCYTES
RBC.: 3.19 MIL/uL — AB (ref 3.70–5.45)
RETIC COUNT ABSOLUTE: 54.2 10*3/uL (ref 33.7–90.7)
Retic Ct Pct: 1.7 % (ref 0.7–2.1)

## 2017-06-15 LAB — IRON AND TIBC
IRON: 56 ug/dL (ref 41–142)
SATURATION RATIOS: 19 % — AB (ref 21–57)
TIBC: 294 ug/dL (ref 236–444)
UIBC: 238 ug/dL

## 2017-06-15 LAB — CBC WITH DIFFERENTIAL (CANCER CENTER ONLY)
BASOS ABS: 0 10*3/uL (ref 0.0–0.1)
BASOS PCT: 0 %
EOS PCT: 3 %
Eosinophils Absolute: 0.2 10*3/uL (ref 0.0–0.5)
HCT: 30.7 % — ABNORMAL LOW (ref 34.8–46.6)
Hemoglobin: 9.5 g/dL — ABNORMAL LOW (ref 11.6–15.9)
Lymphocytes Relative: 21 %
Lymphs Abs: 1.4 10*3/uL (ref 0.9–3.3)
MCH: 29.8 pg (ref 25.1–34.0)
MCHC: 30.9 g/dL — AB (ref 31.5–36.0)
MCV: 96.2 fL (ref 79.5–101.0)
MONO ABS: 0.7 10*3/uL (ref 0.1–0.9)
MONOS PCT: 10 %
Neutro Abs: 4.4 10*3/uL (ref 1.5–6.5)
Neutrophils Relative %: 66 %
PLATELETS: 187 10*3/uL (ref 145–400)
RBC: 3.19 MIL/uL — ABNORMAL LOW (ref 3.70–5.45)
RDW: 14 % (ref 11.2–14.5)
WBC Count: 6.7 10*3/uL (ref 3.9–10.3)

## 2017-06-15 LAB — FERRITIN: FERRITIN: 28 ng/mL (ref 9–269)

## 2017-06-15 NOTE — Telephone Encounter (Signed)
Printed avs and calender of upcoming appointment. Per 5/2 los  

## 2017-06-19 ENCOUNTER — Telehealth: Payer: Self-pay

## 2017-06-19 NOTE — Telephone Encounter (Signed)
Called patient informed iron level is a little low.  Per Dr. Burr Medico take an oral iron supplement like ferrous sulfate one tab daily with orange juice.  Patient verbalized an understanding.

## 2017-06-19 NOTE — Telephone Encounter (Signed)
-----   Message from Truitt Merle, MD sent at 06/17/2017  4:21 PM EDT ----- Please let pt know that her iron level is slightly low. Due to her anemia, I recommend her to take oral iron such as ferrous sulfate 1 tab once daily, with orange jounce or VitC to improve iron absorption, thanks   Truitt Merle  06/17/2017

## 2017-06-26 DIAGNOSIS — J301 Allergic rhinitis due to pollen: Secondary | ICD-10-CM | POA: Diagnosis not present

## 2017-06-26 DIAGNOSIS — N183 Chronic kidney disease, stage 3 (moderate): Secondary | ICD-10-CM | POA: Diagnosis not present

## 2017-06-26 DIAGNOSIS — K219 Gastro-esophageal reflux disease without esophagitis: Secondary | ICD-10-CM | POA: Diagnosis not present

## 2017-06-26 DIAGNOSIS — I10 Essential (primary) hypertension: Secondary | ICD-10-CM | POA: Diagnosis not present

## 2017-06-26 DIAGNOSIS — R000 Tachycardia, unspecified: Secondary | ICD-10-CM | POA: Diagnosis not present

## 2017-06-26 DIAGNOSIS — J449 Chronic obstructive pulmonary disease, unspecified: Secondary | ICD-10-CM | POA: Diagnosis not present

## 2017-06-30 ENCOUNTER — Ambulatory Visit (INDEPENDENT_AMBULATORY_CARE_PROVIDER_SITE_OTHER)
Admission: RE | Admit: 2017-06-30 | Discharge: 2017-06-30 | Disposition: A | Discharge status: Home / Self Care | Payer: PPO | Source: Ambulatory Visit | Attending: Adult Health | Admitting: Adult Health

## 2017-06-30 ENCOUNTER — Encounter: Payer: Self-pay | Admitting: Adult Health

## 2017-06-30 ENCOUNTER — Ambulatory Visit: Payer: PPO | Admitting: Adult Health

## 2017-06-30 VITALS — BP 138/76 | HR 98 | Ht 61.0 in | Wt 185.6 lb

## 2017-06-30 DIAGNOSIS — R0602 Shortness of breath: Secondary | ICD-10-CM | POA: Diagnosis not present

## 2017-06-30 DIAGNOSIS — J9611 Chronic respiratory failure with hypoxia: Secondary | ICD-10-CM | POA: Diagnosis not present

## 2017-06-30 DIAGNOSIS — R05 Cough: Secondary | ICD-10-CM | POA: Diagnosis not present

## 2017-06-30 DIAGNOSIS — J441 Chronic obstructive pulmonary disease with (acute) exacerbation: Secondary | ICD-10-CM | POA: Insufficient documentation

## 2017-06-30 MED ORDER — PREDNISONE 10 MG PO TABS: ORAL_TABLET | ORAL | 0 refills | Status: DC

## 2017-06-30 MED ORDER — AZITHROMYCIN 250 MG PO TABS: ORAL_TABLET | ORAL | 0 refills | Status: AC

## 2017-06-30 NOTE — Patient Instructions (Signed)
Zpack take as directed. Take w/ food.  Prednisone taper over next week, take w/ food  Mucinex DM Twice daily  As needed  Cough/congestion .  Fluids and rest .  Chest xray today .  Continue on Stiolto  2 puffs daily.  Discuss with Primary DM that lisinopril may aggravate cough .  Continue on Oxygen 3l/m , may use 4l/m with activity if needed.  Saline nasal rinses As needed   Claritin daily As needed  Drainage .  Follow up with Dr. Lake Bells in 2-3 months and As needed   Please contact office for sooner follow up if symptoms do not improve or worsen or seek emergency care

## 2017-06-30 NOTE — Assessment & Plan Note (Signed)
Exacerbation   Plan  Patient Instructions  Zpack take as directed. Take w/ food.  Prednisone taper over next week, take w/ food  Mucinex DM Twice daily  As needed  Cough/congestion .  Fluids and rest .  Chest xray today .  Continue on Stiolto  2 puffs daily.  Discuss with Primary DM that lisinopril may aggravate cough .  Continue on Oxygen 3l/m , may use 4l/m with activity if needed.  Saline nasal rinses As needed   Claritin daily As needed  Drainage .  Follow up with Dr. Lake Bells in 2-3 months and As needed   Please contact office for sooner follow up if symptoms do not improve or worsen or seek emergency care

## 2017-06-30 NOTE — Progress Notes (Signed)
@Patient  ID: Courtney Cline, female    DOB: 04/02/1933, 84 y.o.   MRN: [id number]  Chief Complaint  Patient presents with  . Follow-up    COPD     Referring provider: ViaLennette Bihari, MD  HPI: 84 year old female followed for gold D COPD and chronic respiratory failure on oxygen  Spiro: 2015 FEV1 33% pred, on 2L O2 continuously  Chest imaging: 05/2016 CXR images independently reviewed: Hyperinflation consistent with COPD, no pulmonary parenchymal abnormality   06/30/2017 Acute OV : COPD  Presents for an acute office visit . Complains of 2 weeks of cough, congestion , increased dyspnea , wheezing . No fever, chest pain, orthopnea or increased edema.  Remains on Stiolto daily.  On ACE inhibitor . Cough has been worse only for last 2 weeks.  Remains on Oxygen 3l/m. O2 sats dip into mid to upper 80s walking.  Not using any otc for cough.  Has a lot of sinus drainage .  No Known Allergies  Immunization History  Administered Date(s) Administered  . Influenza Split 11/23/2012, 11/14/2014  . Influenza, High Dose Seasonal PF 11/16/2015, 11/15/2016  . Influenza,inj,Quad PF,6+ Mos 11/11/2013  . Pneumococcal Conjugate-13 12/11/2013  . Pneumococcal Polysaccharide-23 11/15/2002  . Tdap 11/10/2011    Past Medical History:  Diagnosis Date  . Allergic rhinitis   . Arthritis   . Breast calcification seen on mammogram    Right breast  . Breast cancer ([place])   . COPD (chronic obstructive pulmonary disease) ([place])   . GERD (gastroesophageal reflux disease)   . Hyperglycemia   . Hyperlipidemia   . Hypertension   . Low back pain   . On home oxygen therapy    all the time  . Osteopenia   . Peripheral edema   . Shortness of breath   . Subclavian artery stenosis, left ([place])   . Vitamin D deficiency   . Wears dentures    top  . Wears glasses    reading    Tobacco History: Social History   Tobacco Use  Smoking Status Former Smoker  . Packs/day: 1.00  . Years: 40.00  . Pack  years: 40.00  . Types: Cigarettes  . Last attempt to quit: 02/14/1994  . Years since quitting: 23.3  Smokeless Tobacco Never Used   Counseling given: Not Answered   Outpatient Encounter Medications as of 06/30/2017  Medication Sig  . acetaminophen (TYLENOL) 325 MG tablet Take 650 mg by mouth every 6 (six) hours as needed.  Marland Kitchen albuterol (PROVENTIL HFA;VENTOLIN HFA) 108 (90 Base) MCG/ACT inhaler Inhale 2 puffs into the lungs every 6 (six) hours as needed for wheezing or shortness of breath.  . anastrozole (ARIMIDEX) 1 MG tablet Take 1 tablet (1 mg total) by mouth daily.  Marland Kitchen aspirin 81 MG tablet Take 81 mg by mouth daily.  . Cholecalciferol (VITAMIN D) 2000 UNITS CAPS Take 1 capsule by mouth daily.  . Cyanocobalamin (VITAMIN B 12 PO) Take 1 tablet by mouth daily.  . DiphenhydrAMINE HCl (BENADRYL PO) Take 1 tablet by mouth as needed.  . furosemide (LASIX) 40 MG tablet Take 1 tablet (40 mg total) by mouth 2 (two) times daily. <PLEASE MAKE APPOINTMENT FOR REFILLS> (Patient taking differently: Take 40 mg by mouth daily. <PLEASE MAKE APPOINTMENT FOR REFILLS>)  . lisinopril (PRINIVIL,ZESTRIL) 40 MG tablet Take 40 mg by mouth daily.  Marland Kitchen lovastatin (MEVACOR) 40 MG tablet Take 40 mg by mouth at bedtime.  Marland Kitchen omeprazole (PRILOSEC) 20 MG capsule Take 20 mg by mouth  daily.  Marland Kitchen STIOLTO RESPIMAT 2.5-2.5 MCG/ACT AERS INHALE 2 PUFFS INTO THE LUNGS DAILY.  Marland Kitchen azithromycin (ZITHROMAX Z-PAK) 250 MG tablet Take 2 tablets (500 mg) on  Day 1,  followed by 1 tablet (250 mg) once daily on Days 2 through 5.  . KLOR-CON M20 20 MEQ tablet TAKE 1 TABLET BY MOUTH DAILY (Patient not taking: Reported on 06/30/2017)  . predniSONE (DELTASONE) 10 MG tablet 4 tabs for 2 days, then 3 tabs for 2 days, 2 tabs for 2 days, then 1 tab for 2 days, then stop   No facility-administered encounter medications on file as of 06/30/2017.      Review of Systems  Constitutional:   No  weight loss, night sweats,  Fevers, chills, fatigue, or   lassitude.  HEENT:   No headaches,  Difficulty swallowing,  Tooth/dental problems, or  Sore throat,                No sneezing, itching, ear ache, + nasal congestion, post nasal drip,   CV:  No chest pain,  Orthopnea, PND, swelling in lower extremities, anasarca, dizziness, palpitations, syncope.   GI  No heartburn, indigestion, abdominal pain, nausea, vomiting, diarrhea, change in bowel habits, loss of appetite, bloody stools.   Resp:  .  No chest wall deformity  Skin: no rash or lesions.  GU: no dysuria, change in color of urine, no urgency or frequency.  No flank pain, no hematuria   MS:  No joint pain or swelling.  No decreased range of motion.  No back pain.    Physical Exam  BP 138/76 (BP Location: Right Arm, Cuff Size: Large)   Pulse 98   Ht 5\' 1"  (1.549 m)   Wt 185 lb 9.6 oz (84.2 kg)   SpO2 97%   BMI 35.07 kg/m   GEN: A/Ox3; pleasant , NAD, elderly on o2    HEENT:  [place]/AT,  EACs-clear, TMs-wnl, NOSE-clear drainage , THROAT-clear, no lesions, no postnasal drip or exudate noted.   NECK:  Supple w/ fair ROM; no JVD; normal carotid impulses w/o bruits; no thyromegaly or nodules palpated; no lymphadenopathy.    RESP  Decreased bs in bases , no accessory muscle use, no dullness to percussion  CARD:  RRR, no m/r/g, no peripheral edema, pulses intact, no cyanosis or clubbing.  GI:   Soft & nt; nml bowel sounds; no organomegaly or masses detected.   Musco: Warm bil, no deformities or joint swelling noted.   Neuro: alert, no focal deficits noted.    Skin: Warm, no lesions or rashes    Lab Results:  CBC  BMET   BNP No results found for: BNP  ProBNP  Imaging: Dg Chest 2 View  Result Date: 06/30/2017 CLINICAL DATA:  Cough and shortness of breath EXAM: CHEST - 2 VIEW COMPARISON:  June 13, 2016 FINDINGS: There is mild hyperexpansion of the lungs with prominence of the retrosternal clear space, findings indicative of a degree of underlying COPD. There is mild  scarring in the bases. There is no edema or consolidation. The heart size and pulmonary vascularity are normal. There is aortic atherosclerosis. No adenopathy. No bone lesions. IMPRESSION: Underlying COPD, stable. Mild scarring in the bases. No edema or consolidation. Stable cardiac silhouette. There is aortic atherosclerosis. Aortic Atherosclerosis (ICD10-I70.0) and Emphysema (ICD10-J43.9). Electronically Signed   By: Lowella Grip III M.D.   On: 06/30/2017 09:56     Assessment & Plan:   COPD exacerbation ([place]) Exacerbation   Plan  Patient Instructions  Zpack take as directed. Take w/ food.  Prednisone taper over next week, take w/ food  Mucinex DM Twice daily  As needed  Cough/congestion .  Fluids and rest .  Chest xray today .  Continue on Stiolto  2 puffs daily.  Discuss with Primary DM that lisinopril may aggravate cough .  Continue on Oxygen 3l/m , may use 4l/m with activity if needed.  Saline nasal rinses As needed   Claritin daily As needed  Drainage .  Follow up with Dr. [place] Bells in 2-3 months and As needed   Please contact office for sooner follow up if symptoms do not improve or worsen or seek emergency care       Chronic hypoxemic respiratory failure ([place]) Cont on O2 , keep sats >88-90%.      Rexene Edison, NP 06/30/2017

## 2017-06-30 NOTE — Assessment & Plan Note (Signed)
Cont on O2 , keep sats >88-90%.

## 2017-07-04 NOTE — Progress Notes (Signed)
Reviewed, agree 

## 2017-07-17 ENCOUNTER — Other Ambulatory Visit (INDEPENDENT_AMBULATORY_CARE_PROVIDER_SITE_OTHER): Payer: PPO

## 2017-07-17 ENCOUNTER — Ambulatory Visit: Payer: PPO | Admitting: Adult Health

## 2017-07-17 ENCOUNTER — Encounter: Payer: Self-pay | Admitting: Adult Health

## 2017-07-17 VITALS — BP 126/68 | HR 101 | Ht 61.0 in | Wt 183.8 lb

## 2017-07-17 DIAGNOSIS — I5030 Unspecified diastolic (congestive) heart failure: Secondary | ICD-10-CM | POA: Insufficient documentation

## 2017-07-17 DIAGNOSIS — J441 Chronic obstructive pulmonary disease with (acute) exacerbation: Secondary | ICD-10-CM

## 2017-07-17 LAB — BASIC METABOLIC PANEL
BUN: 26 mg/dL — ABNORMAL HIGH (ref 6–23)
CHLORIDE: 104 meq/L (ref 96–112)
CO2: 32 meq/L (ref 19–32)
Calcium: 9.5 mg/dL (ref 8.4–10.5)
Creatinine, Ser: 1.39 mg/dL — ABNORMAL HIGH (ref 0.40–1.20)
GFR: 38.59 mL/min — ABNORMAL LOW (ref >60.00)
Glucose, Bld: 106 mg/dL — ABNORMAL HIGH (ref 70–99)
Potassium: 4.3 mEq/L (ref 3.5–5.1)
SODIUM: 144 meq/L (ref 135–145)

## 2017-07-17 LAB — BRAIN NATRIURETIC PEPTIDE: PRO B NATRI PEPTIDE: 29 pg/mL (ref 0.0–100.0)

## 2017-07-17 NOTE — Progress Notes (Signed)
Reviewed, agree 

## 2017-07-17 NOTE — Assessment & Plan Note (Signed)
Slowly resolving COPD exacerbation with increased COPD symptoms/burden. Spirometry shows no significant change in lung function with FEV1 at 31% We will change Stiolto to Trelegy to see if this helps with symptoms Check labs with a CBC be met BNP.  Plan  Patient Instructions  Stop Stiolto .  Begin TRELEGY 1 puff daily  Discuss with Primary DM that lisinopril may aggravate cough .  Labs today  Continue on Oxygen 3l/m , may use 4l/m with activity if needed.  Saline nasal rinses As needed   Claritin daily As needed  Drainage .  Follow up with Dr. [place] Bells in 6-8 weeks and As needed   Please contact office for sooner follow up if symptoms do not improve or worsen or seek emergency care

## 2017-07-17 NOTE — Assessment & Plan Note (Signed)
Check cbc 

## 2017-07-17 NOTE — Patient Instructions (Addendum)
Stop Stiolto .  Begin TRELEGY 1 puff daily  Discuss with Primary DM that lisinopril may aggravate cough .  Labs today  Continue on Oxygen 3l/m , may use 4l/m with activity if needed.  Saline nasal rinses As needed   Claritin daily As needed  Drainage .  Follow up with Dr. Lake Bells in 6-8 weeks and As needed   Please contact office for sooner follow up if symptoms do not improve or worsen or seek emergency care

## 2017-07-17 NOTE — Assessment & Plan Note (Signed)
Cont on O2 .  

## 2017-07-17 NOTE — Progress Notes (Signed)
_0  ID: Courtney Cline, female    DOB: 08/26/1933, 84 y.o.   MRN: [id number]  Chief Complaint  Patient presents with  . Acute Visit    Referring provider: Leeroy Cha  HPI: 84 year old female followed for gold D COPD and chronic respiratory failure on oxygen  Spiro: 2015 FEV1 33% pred, on 2L O2 continuously  Chest imaging: 05/2016 CXR images independently reviewed: Hyperinflation consistent with COPD, no pulmonary parenchymal abnormality  07/17/2017 Acute OV : COPD  Patient returns for a acute office visit.  Patient was seen 2 weeks ago for COPD exacerbation.  She was treated with Z-Pak and a prednisone taper.  Chest x-ray showed COPD changes.  Patient says that she did get better.  But over the last few days has felt like that her breathing was not doing as well.  She gets more short of breath with walking. Patient denies any chest pain, orthopnea, hemoptysis or fever.  Does have chronic leg swelling is on Lasix 80 mg. Spirometry today shows similar severe COPD with an FEV1 at 31% ratio 38 and FVC 60%. Patient remains on Stiolto daily. Patient is on oxygen 3 L at rest.  She is supposed to use 4 L with walking.    No Known Allergies  Immunization History  Administered Date(s) Administered  . Influenza Split 11/23/2012, 11/14/2014  . Influenza, High Dose Seasonal PF 11/16/2015, 11/15/2016  . Influenza,inj,Quad PF,6+ Mos 11/11/2013  . Pneumococcal Conjugate-13 12/11/2013  . Pneumococcal Polysaccharide-23 11/15/2002  . Tdap 11/10/2011    Past Medical History:  Diagnosis Date  . Allergic rhinitis   . Arthritis   . Breast calcification seen on mammogram    Right breast  . Breast cancer ([place])   . COPD (chronic obstructive pulmonary disease) ([place])   . GERD (gastroesophageal reflux disease)   . Hyperglycemia   . Hyperlipidemia   . Hypertension   . Low back pain   . On home oxygen therapy    all the time  . Osteopenia   . Peripheral edema   .  Shortness of breath   . Subclavian artery stenosis, left ([place])   . Vitamin D deficiency   . Wears dentures    top  . Wears glasses    reading    Tobacco History: Social History   Tobacco Use  Smoking Status Former Smoker  . Packs/day: 1.00  . Years: 40.00  . Pack years: 40.00  . Types: Cigarettes  . Last attempt to quit: 02/14/1994  . Years since quitting: 23.4  Smokeless Tobacco Never Used   Counseling given: Not Answered   Outpatient Encounter Medications as of 07/17/2017  Medication Sig  . acetaminophen (TYLENOL) 325 MG tablet Take 650 mg by mouth every 6 (six) hours as needed.  Marland Kitchen albuterol (PROVENTIL HFA;VENTOLIN HFA) 108 (90 Base) MCG/ACT inhaler Inhale 2 puffs into the lungs every 6 (six) hours as needed for wheezing or shortness of breath.  . anastrozole (ARIMIDEX) 1 MG tablet Take 1 tablet (1 mg total) by mouth daily.  Marland Kitchen aspirin 81 MG tablet Take 81 mg by mouth daily.  . Cholecalciferol (VITAMIN D) 2000 UNITS CAPS Take 1 capsule by mouth daily.  . Cyanocobalamin (VITAMIN B 12 PO) Take 1 tablet by mouth daily.  . DiphenhydrAMINE HCl (BENADRYL PO) Take 1 tablet by mouth as needed.  . furosemide (LASIX) 40 MG tablet Take 1 tablet (40 mg total) by mouth 2 (two) times daily. <PLEASE MAKE APPOINTMENT FOR REFILLS> (Patient taking differently: Take 40 mg  by mouth daily. <PLEASE MAKE APPOINTMENT FOR REFILLS>)  . KLOR-CON M20 20 MEQ tablet TAKE 1 TABLET BY MOUTH DAILY  . lisinopril (PRINIVIL,ZESTRIL) 40 MG tablet Take 40 mg by mouth daily.  Marland Kitchen lovastatin (MEVACOR) 40 MG tablet Take 40 mg by mouth at bedtime.  Marland Kitchen omeprazole (PRILOSEC) 20 MG capsule Take 20 mg by mouth daily.  Marland Kitchen STIOLTO RESPIMAT 2.5-2.5 MCG/ACT AERS INHALE 2 PUFFS INTO THE LUNGS DAILY.  . [DISCONTINUED] predniSONE (DELTASONE) 10 MG tablet 4 tabs for 2 days, then 3 tabs for 2 days, 2 tabs for 2 days, then 1 tab for 2 days, then stop (Patient not taking: Reported on 07/17/2017)   No facility-administered encounter  medications on file as of 07/17/2017.      Review of Systems  Constitutional:   No  weight loss, night sweats,  Fevers, chills,  +fatigue, or  lassitude.  HEENT:   No headaches,  Difficulty swallowing,  Tooth/dental problems, or  Sore throat,                No sneezing, itching, ear ache, nasal congestion, post nasal drip,   CV:  No chest pain,  Orthopnea, PND, swelling in lower extremities, anasarca, dizziness, palpitations, syncope.   GI  No heartburn, indigestion, abdominal pain, nausea, vomiting, diarrhea, change in bowel habits, loss of appetite, bloody stools.   Resp:    No chest wall deformity  Skin: no rash or lesions.  GU: no dysuria, change in color of urine, no urgency or frequency.  No flank pain, no hematuria   MS:  No joint pain or swelling.  No decreased range of motion.  No back pain.    Physical Exam  BP 126/68 (BP Location: Right Arm, Cuff Size: Normal)   Pulse (!) 101   Ht 5' 1" (1.549 m)   Wt 183 lb 12.8 oz (83.4 kg)   SpO2 97%   BMI 34.73 kg/m   GEN: A/Ox3; pleasant , NAD, elderly, on oxygen   HEENT:  [place]/AT,  EACs-clear, TMs-wnl, NOSE-clear, THROAT-clear, no lesions, no postnasal drip or exudate noted.   NECK:  Supple w/ fair ROM; no JVD; normal carotid impulses w/o bruits; no thyromegaly or nodules palpated; no lymphadenopathy.    RESP decreased breath sounds in the bases  w/o, wheezes/ rales/ or rhonchi. no accessory muscle use, no dullness to percussion  CARD:  RRR, no m/r/g, trace peripheral edema, pulses intact, no cyanosis or clubbing.  GI:   Soft & nt; nml bowel sounds; no organomegaly or masses detected.   Musco: Warm bil, no deformities or joint swelling noted.   Neuro: alert, no focal deficits noted.    Skin: Warm, no lesions or rashes    Lab Results:   BMET  BNP No results found for: BNP   Imaging: Dg Chest 2 View  Result Date: 06/30/2017 CLINICAL DATA:  Cough and shortness of breath EXAM: CHEST - 2 VIEW COMPARISON:   June 13, 2016 FINDINGS: There is mild hyperexpansion of the lungs with prominence of the retrosternal clear space, findings indicative of a degree of underlying COPD. There is mild scarring in the bases. There is no edema or consolidation. The heart size and pulmonary vascularity are normal. There is aortic atherosclerosis. No adenopathy. No bone lesions. IMPRESSION: Underlying COPD, stable. Mild scarring in the bases. No edema or consolidation. Stable cardiac silhouette. There is aortic atherosclerosis. Aortic Atherosclerosis (ICD10-I70.0) and Emphysema (ICD10-J43.9). Electronically Signed   By: Lowella Grip III M.D.   On: 06/30/2017  09:56     Assessment & Plan:   COPD exacerbation ([place]) Slowly resolving COPD exacerbation with increased COPD symptoms/burden. Spirometry shows no significant change in lung function with FEV1 at 31% We will change Stiolto to Trelegy to see if this helps with symptoms Check labs with a CBC be met BNP.  Plan  Patient Instructions  Stop Stiolto .  Begin TRELEGY 1 puff daily  Discuss with Primary DM that lisinopril may aggravate cough .  Labs today  Continue on Oxygen 3l/m , may use 4l/m with activity if needed.  Saline nasal rinses As needed   Claritin daily As needed  Drainage .  Follow up with Dr. [place] Bells in 6-8 weeks and As needed   Please contact office for sooner follow up if symptoms do not improve or worsen or seek emergency care       Anemia of chronic disease Check cbc  Diastolic CHF ([place]) Appears compensated.  Will check bnp today  Cont on laisx and O2   Chronic hypoxemic respiratory failure ([place]) Cont on O2      Tammy Parrett, NP 07/17/2017

## 2017-07-17 NOTE — Assessment & Plan Note (Signed)
Appears compensated.  Will check bnp today  Cont on laisx and O2

## 2017-07-31 ENCOUNTER — Telehealth: Payer: Self-pay | Admitting: Pulmonary Disease

## 2017-07-31 MED ORDER — FLUTICASONE-UMECLIDIN-VILANT 100-62.5-25 MCG/INH IN AEPB: 1.0000 | INHALATION_SPRAY | Freq: Every day | RESPIRATORY_TRACT | 5 refills | Status: DC

## 2017-07-31 NOTE — Telephone Encounter (Signed)
Spoke with pt. She is needing a prescription for Trelegy. Pt was given a sample at her last OV with Tammy. Rx has been sent in. Nothing further was needed.

## 2017-08-07 DIAGNOSIS — R2232 Localized swelling, mass and lump, left upper limb: Secondary | ICD-10-CM | POA: Diagnosis not present

## 2017-08-07 DIAGNOSIS — G629 Polyneuropathy, unspecified: Secondary | ICD-10-CM | POA: Diagnosis not present

## 2017-08-07 DIAGNOSIS — I83813 Varicose veins of bilateral lower extremities with pain: Secondary | ICD-10-CM | POA: Diagnosis not present

## 2017-08-15 ENCOUNTER — Other Ambulatory Visit: Payer: Self-pay | Admitting: Pulmonary Disease

## 2017-08-28 ENCOUNTER — Ambulatory Visit: Payer: PPO | Admitting: Pulmonary Disease

## 2017-10-10 DIAGNOSIS — R350 Frequency of micturition: Secondary | ICD-10-CM | POA: Diagnosis not present

## 2017-10-19 ENCOUNTER — Other Ambulatory Visit: Payer: PPO

## 2017-10-26 ENCOUNTER — Ambulatory Visit
Admission: RE | Admit: 2017-10-26 | Discharge: 2017-10-26 | Disposition: A | Discharge status: Home / Self Care | Payer: PPO | Source: Ambulatory Visit | Attending: Hematology | Admitting: Hematology

## 2017-10-26 DIAGNOSIS — C50211 Malignant neoplasm of upper-inner quadrant of right female breast: Secondary | ICD-10-CM

## 2017-10-26 DIAGNOSIS — Z853 Personal history of malignant neoplasm of breast: Secondary | ICD-10-CM | POA: Diagnosis not present

## 2017-10-26 DIAGNOSIS — Z170 Estrogen receptor positive status [ER+]: Principal | ICD-10-CM

## 2017-10-26 DIAGNOSIS — R928 Other abnormal and inconclusive findings on diagnostic imaging of breast: Secondary | ICD-10-CM | POA: Diagnosis not present

## 2017-11-07 DIAGNOSIS — M5410 Radiculopathy, site unspecified: Secondary | ICD-10-CM | POA: Diagnosis not present

## 2017-11-20 ENCOUNTER — Other Ambulatory Visit: Payer: Self-pay | Admitting: Internal Medicine

## 2017-11-20 ENCOUNTER — Ambulatory Visit
Admission: RE | Admit: 2017-11-20 | Discharge: 2017-11-20 | Disposition: A | Discharge status: Home / Self Care | Payer: PPO | Source: Ambulatory Visit | Attending: Internal Medicine | Admitting: Internal Medicine

## 2017-11-20 DIAGNOSIS — S8011XA Contusion of right lower leg, initial encounter: Secondary | ICD-10-CM | POA: Diagnosis not present

## 2017-11-20 DIAGNOSIS — M79661 Pain in right lower leg: Secondary | ICD-10-CM | POA: Diagnosis not present

## 2017-11-20 DIAGNOSIS — W19XXXA Unspecified fall, initial encounter: Secondary | ICD-10-CM | POA: Diagnosis not present

## 2017-11-20 DIAGNOSIS — S8991XA Unspecified injury of right lower leg, initial encounter: Secondary | ICD-10-CM | POA: Diagnosis not present

## 2017-11-20 DIAGNOSIS — M79604 Pain in right leg: Secondary | ICD-10-CM | POA: Diagnosis not present

## 2017-12-01 ENCOUNTER — Encounter: Payer: Self-pay | Admitting: Pulmonary Disease

## 2017-12-01 ENCOUNTER — Ambulatory Visit (INDEPENDENT_AMBULATORY_CARE_PROVIDER_SITE_OTHER): Payer: PPO | Admitting: Pulmonary Disease

## 2017-12-01 VITALS — BP 140/82 | HR 100 | Ht 58.86 in | Wt 183.0 lb

## 2017-12-01 DIAGNOSIS — J9611 Chronic respiratory failure with hypoxia: Secondary | ICD-10-CM | POA: Diagnosis not present

## 2017-12-01 DIAGNOSIS — J432 Centrilobular emphysema: Secondary | ICD-10-CM

## 2017-12-01 MED ORDER — FLUTICASONE-UMECLIDIN-VILANT 100-62.5-25 MCG/INH IN AEPB: 1.0000 | INHALATION_SPRAY | Freq: Every day | RESPIRATORY_TRACT | 0 refills | Status: DC

## 2017-12-01 NOTE — Patient Instructions (Signed)
Severe COPD: Continue Trelegy 1 puff daily Continue albuterol as needed for chest tightness wheezing or shortness of breath I am glad you have had a flu shot Stay active Practice good hand hygiene  Chronic respiratory failure with hypoxemia: Keep using 3 L of oxygen continuously  We will see you back in February or March 2020

## 2017-12-01 NOTE — Addendum Note (Signed)
Addended by: Dolores Lory on: 12/01/2017 09:34 AM   Modules accepted: Orders

## 2017-12-01 NOTE — Progress Notes (Signed)
Subjective:    Patient ID: Courtney Cline, female    DOB: March 10, 1933, 84 y.o.   MRN: [id number]  Synopsis: GOLD Grade D COPD In 2018 she had multiple exacerbations of COPD  HPI  Chief Complaint  Patient presents with  . Follow-up    shortness of breath, recent fall    Courtney Cline fell recently, didn't break anything (she thinks) and has been slowly recovering.  She did eventually go to see her doctor.  She had some x-rays but nothing was broken.  She says that she feels short of breath with exertion.  She says the oxygen at 3L is working.  She likes Trelegy more this time.  She says she thinks its lasts her longer.  Last month's cost $135.    Past Medical History:  Diagnosis Date  . Allergic rhinitis   . Arthritis   . Breast calcification seen on mammogram    Right breast  . Breast cancer ([place])   . COPD (chronic obstructive pulmonary disease) ([place])   . GERD (gastroesophageal reflux disease)   . Hyperglycemia   . Hyperlipidemia   . Hypertension   . Low back pain   . On home oxygen therapy    all the time  . Osteopenia   . Peripheral edema   . Shortness of breath   . Subclavian artery stenosis, left ([place])   . Vitamin D deficiency   . Wears dentures    top  . Wears glasses    reading     Review of Systems  Constitutional: Negative for chills, fatigue and fever.  HENT: Negative for postnasal drip, rhinorrhea and sinus pain.   Respiratory: Positive for shortness of breath. Negative for cough and wheezing.   Cardiovascular: Negative for palpitations and leg swelling.       Objective:   Physical Exam Vitals:   12/01/17 0905  BP: 140/82  Pulse: 100  SpO2: 98%  Weight: 183 lb (83 kg)  Height: 4' 10.86" (1.495 m)   3L O2  Gen: chronically ill appearing HENT: OP clear, TM's clear, neck supple PULM: Diminished bases B, normal percussion CV: RRR, no mgr, trace edema GI: BS+, soft, nontender Derm: no cyanosis or rash Psyche: normal mood and  affect   Spiro: 2015 FEV1 33% pred, on 2L O2 continuously  Chest imaging: 05/2016 CXR images independently reviewed: Hyperinflation consistent with COPD, no pulmonary parenchymal abnormality  CBC    Component Value Date/Time   WBC 6.7 06/15/2017 1030   WBC 5.4 11/07/2016 0932   WBC 6.5 08/04/2016 1156   RBC 3.19 (L) 06/15/2017 1030   RBC 3.19 (L) 06/15/2017 1030   HGB 9.5 (L) 06/15/2017 1030   HGB 9.6 (L) 11/07/2016 0932   HCT 30.7 (L) 06/15/2017 1030   HCT 31.0 (L) 11/07/2016 0932   PLT 187 06/15/2017 1030   PLT 205 11/07/2016 0932   MCV 96.2 06/15/2017 1030   MCV 96.6 11/07/2016 0932   MCH 29.8 06/15/2017 1030   MCHC 30.9 (L) 06/15/2017 1030   RDW 14.0 06/15/2017 1030   RDW 13.4 11/07/2016 0932   LYMPHSABS 1.4 06/15/2017 1030   LYMPHSABS 1.3 11/07/2016 0932   MONOABS 0.7 06/15/2017 1030   MONOABS 0.4 11/07/2016 0932   EOSABS 0.2 06/15/2017 1030   EOSABS 0.2 11/07/2016 0932   BASOSABS 0.0 06/15/2017 1030   BASOSABS 0.0 11/07/2016 0932   07/2016 IgE 242     Assessment & Plan:   Chronic hypoxemic respiratory failure ([facility])  Centrilobular emphysema ([facility])  Discussion: This has been a stable interval for her without an exacerbation.  She has done well since the last visit since starting Trelegy.  She says that she feels that it seems to last longer than Stiolto.  She has had her flu shot already  Severe COPD: Continue Trelegy 1 puff daily Continue albuterol as needed for chest tightness wheezing or shortness of breath I am glad you have had a flu shot Stay active Practice good hand hygiene  Chronic respiratory failure with hypoxemia: Keep using 3 L of oxygen continuously  We will see you back in February or March 2020  Updated Medication List Outpatient Encounter Medications as of 12/01/2017  Medication Sig  . acetaminophen (TYLENOL) 325 MG tablet Take 650 mg by mouth every 6 (six) hours as needed.  Marland Kitchen anastrozole (ARIMIDEX) 1 MG tablet Take 1 tablet (1 mg  total) by mouth daily.  . Cholecalciferol (VITAMIN D) 2000 UNITS CAPS Take 1 capsule by mouth daily.  . Cyanocobalamin (VITAMIN B 12 PO) Take 1 tablet by mouth daily.  . DiphenhydrAMINE HCl (BENADRYL PO) Take 1 tablet by mouth as needed.  . Fluticasone-Umeclidin-Vilant (TRELEGY ELLIPTA) 100-62.5-25 MCG/INH AEPB Inhale 1 puff into the lungs daily.  . furosemide (LASIX) 40 MG tablet Take 1 tablet (40 mg total) by mouth 2 (two) times daily. <PLEASE MAKE APPOINTMENT FOR REFILLS> (Patient taking differently: Take 40 mg by mouth daily. <PLEASE MAKE APPOINTMENT FOR REFILLS>)  . KLOR-CON M20 20 MEQ tablet TAKE 1 TABLET BY MOUTH DAILY  . lisinopril (PRINIVIL,ZESTRIL) 40 MG tablet Take 40 mg by mouth daily.  Marland Kitchen lovastatin (MEVACOR) 40 MG tablet Take 40 mg by mouth at bedtime.  Marland Kitchen omeprazole (PRILOSEC) 20 MG capsule Take 20 mg by mouth daily.  Marland Kitchen PROAIR HFA 108 (90 Base) MCG/ACT inhaler TAKE 2 PUFFS BY MOUTH EVERY 6 HOURS AS NEEDED FOR WHEEZE OR SHORTNESS OF BREATH  . [DISCONTINUED] aspirin 81 MG tablet Take 81 mg by mouth daily.  . [DISCONTINUED] STIOLTO RESPIMAT 2.5-2.5 MCG/ACT AERS INHALE 2 PUFFS INTO THE LUNGS DAILY.   No facility-administered encounter medications on file as of 12/01/2017.

## 2017-12-06 DIAGNOSIS — N183 Chronic kidney disease, stage 3 (moderate): Secondary | ICD-10-CM | POA: Diagnosis not present

## 2017-12-06 DIAGNOSIS — K219 Gastro-esophageal reflux disease without esophagitis: Secondary | ICD-10-CM | POA: Diagnosis not present

## 2017-12-06 DIAGNOSIS — E611 Iron deficiency: Secondary | ICD-10-CM | POA: Diagnosis not present

## 2017-12-06 DIAGNOSIS — D0511 Intraductal carcinoma in situ of right breast: Secondary | ICD-10-CM | POA: Diagnosis not present

## 2017-12-06 DIAGNOSIS — Z1389 Encounter for screening for other disorder: Secondary | ICD-10-CM | POA: Diagnosis not present

## 2017-12-06 DIAGNOSIS — E785 Hyperlipidemia, unspecified: Secondary | ICD-10-CM | POA: Diagnosis not present

## 2017-12-06 DIAGNOSIS — R7301 Impaired fasting glucose: Secondary | ICD-10-CM | POA: Diagnosis not present

## 2017-12-06 DIAGNOSIS — J449 Chronic obstructive pulmonary disease, unspecified: Secondary | ICD-10-CM | POA: Diagnosis not present

## 2017-12-06 DIAGNOSIS — Z0000 Encounter for general adult medical examination without abnormal findings: Secondary | ICD-10-CM | POA: Diagnosis not present

## 2017-12-06 DIAGNOSIS — L03115 Cellulitis of right lower limb: Secondary | ICD-10-CM | POA: Diagnosis not present

## 2017-12-06 DIAGNOSIS — I10 Essential (primary) hypertension: Secondary | ICD-10-CM | POA: Diagnosis not present

## 2017-12-11 ENCOUNTER — Other Ambulatory Visit: Payer: PPO

## 2017-12-22 ENCOUNTER — Other Ambulatory Visit: Payer: Self-pay | Admitting: Internal Medicine

## 2017-12-22 DIAGNOSIS — M79606 Pain in leg, unspecified: Secondary | ICD-10-CM | POA: Diagnosis not present

## 2017-12-22 DIAGNOSIS — W19XXXS Unspecified fall, sequela: Secondary | ICD-10-CM | POA: Diagnosis not present

## 2017-12-22 DIAGNOSIS — R600 Localized edema: Secondary | ICD-10-CM | POA: Diagnosis not present

## 2017-12-25 ENCOUNTER — Ambulatory Visit
Admission: RE | Admit: 2017-12-25 | Discharge: 2017-12-25 | Disposition: A | Discharge status: Home / Self Care | Payer: PPO | Source: Ambulatory Visit | Attending: Internal Medicine | Admitting: Internal Medicine

## 2017-12-25 DIAGNOSIS — R600 Localized edema: Secondary | ICD-10-CM | POA: Diagnosis not present

## 2017-12-26 ENCOUNTER — Telehealth: Payer: Self-pay | Admitting: Pulmonary Disease

## 2017-12-26 MED ORDER — FLUTICASONE-UMECLIDIN-VILANT 100-62.5-25 MCG/INH IN AEPB: 1.0000 | INHALATION_SPRAY | Freq: Every day | RESPIRATORY_TRACT | 0 refills | Status: DC

## 2017-12-26 NOTE — Telephone Encounter (Signed)
Spoke with pt, advised a sample of Trelegy will be left up front to pick up. Nothing further is needed.

## 2017-12-26 NOTE — Addendum Note (Signed)
Addended by: Jannette Spanner on: 12/26/2017 12:28 PM   Modules accepted: Orders

## 2018-01-02 DIAGNOSIS — D509 Iron deficiency anemia, unspecified: Secondary | ICD-10-CM | POA: Diagnosis not present

## 2018-01-02 DIAGNOSIS — R1084 Generalized abdominal pain: Secondary | ICD-10-CM | POA: Diagnosis not present

## 2018-01-08 ENCOUNTER — Telehealth: Payer: Self-pay | Admitting: Pulmonary Disease

## 2018-01-08 ENCOUNTER — Other Ambulatory Visit: Payer: Self-pay | Admitting: Physician Assistant

## 2018-01-08 DIAGNOSIS — R140 Abdominal distension (gaseous): Secondary | ICD-10-CM | POA: Diagnosis not present

## 2018-01-08 DIAGNOSIS — R1033 Periumbilical pain: Secondary | ICD-10-CM

## 2018-01-08 DIAGNOSIS — K5900 Constipation, unspecified: Secondary | ICD-10-CM

## 2018-01-08 DIAGNOSIS — N183 Chronic kidney disease, stage 3 (moderate): Secondary | ICD-10-CM | POA: Diagnosis not present

## 2018-01-08 DIAGNOSIS — J449 Chronic obstructive pulmonary disease, unspecified: Secondary | ICD-10-CM | POA: Diagnosis not present

## 2018-01-08 DIAGNOSIS — Z1211 Encounter for screening for malignant neoplasm of colon: Secondary | ICD-10-CM | POA: Diagnosis not present

## 2018-01-08 DIAGNOSIS — R1084 Generalized abdominal pain: Secondary | ICD-10-CM

## 2018-01-08 DIAGNOSIS — Z853 Personal history of malignant neoplasm of breast: Secondary | ICD-10-CM | POA: Diagnosis not present

## 2018-01-08 DIAGNOSIS — D509 Iron deficiency anemia, unspecified: Secondary | ICD-10-CM

## 2018-01-08 NOTE — Telephone Encounter (Signed)
Called and spoke with patient, advised that we do not have any samples at this time. Advised she could call back at the end of the week and we may have some. Patient verbalized understanding. Nothing further needed.

## 2018-01-15 ENCOUNTER — Ambulatory Visit
Admission: RE | Admit: 2018-01-15 | Discharge: 2018-01-15 | Disposition: A | Discharge status: Home / Self Care | Payer: PPO | Source: Ambulatory Visit | Attending: Physician Assistant | Admitting: Physician Assistant

## 2018-01-15 DIAGNOSIS — R1033 Periumbilical pain: Secondary | ICD-10-CM

## 2018-01-15 DIAGNOSIS — D509 Iron deficiency anemia, unspecified: Secondary | ICD-10-CM

## 2018-01-15 DIAGNOSIS — R1084 Generalized abdominal pain: Secondary | ICD-10-CM

## 2018-01-15 DIAGNOSIS — N183 Chronic kidney disease, stage 3 (moderate): Secondary | ICD-10-CM | POA: Diagnosis not present

## 2018-01-15 DIAGNOSIS — K5730 Diverticulosis of large intestine without perforation or abscess without bleeding: Secondary | ICD-10-CM | POA: Diagnosis not present

## 2018-01-15 DIAGNOSIS — K5900 Constipation, unspecified: Secondary | ICD-10-CM

## 2018-01-15 DIAGNOSIS — K8020 Calculus of gallbladder without cholecystitis without obstruction: Secondary | ICD-10-CM | POA: Diagnosis not present

## 2018-01-29 ENCOUNTER — Encounter: Payer: Self-pay | Admitting: Nurse Practitioner

## 2018-01-29 ENCOUNTER — Ambulatory Visit: Payer: PPO | Admitting: Nurse Practitioner

## 2018-01-29 VITALS — BP 130/64 | HR 99 | Ht 58.86 in | Wt 177.0 lb

## 2018-01-29 DIAGNOSIS — J441 Chronic obstructive pulmonary disease with (acute) exacerbation: Secondary | ICD-10-CM

## 2018-01-29 DIAGNOSIS — J329 Chronic sinusitis, unspecified: Secondary | ICD-10-CM

## 2018-01-29 DIAGNOSIS — J9611 Chronic respiratory failure with hypoxia: Secondary | ICD-10-CM | POA: Diagnosis not present

## 2018-01-29 MED ORDER — FLUTICASONE-UMECLIDIN-VILANT 100-62.5-25 MCG/INH IN AEPB: 1.0000 | INHALATION_SPRAY | Freq: Every day | RESPIRATORY_TRACT | 0 refills | Status: AC

## 2018-01-29 MED ORDER — PREDNISONE 10 MG PO TABS: ORAL_TABLET | ORAL | 0 refills | Status: DC

## 2018-01-29 MED ORDER — AZITHROMYCIN 250 MG PO TABS: ORAL_TABLET | ORAL | 0 refills | Status: DC

## 2018-01-29 NOTE — Progress Notes (Signed)
@Patient  ID: Courtney Cline, female    DOB: 03/31/1933, 84 y.o.   MRN: [id number]  Chief Complaint  Patient presents with  . Shortness of Breath    with congestion    Referring provider: Leeroy Cha,*  HPI 84 year old female followed for COPD GOLD D and chronic respiratory failure on oxygen at 3L [place] continuously. She is followed by Dr. Lake Bells.   Tests: Arlyce Harman: 2015 FEV1 33% pred, on 2L O2 continuously  Chest imaging: 05/2016 CXR images independently reviewed: Hyperinflation consistent with COPD, no pulmonary parenchymal abnormality 06/30/17 CXR Underlying COPD, stable. Mild scarring in the bases. No edema or consolidation. Stable cardiac silhouette. There is aortic atherosclerosis.  OV 01/29/18 - acute - Chest congestion Patient presents with head and chest congestion that started last week. She states that she has postnasal drip. She reports sinus pressure and pain. She reports productive cough with yellow sputum. She also complains of a hoarse voice. She states that symptoms have progressively worsened over the past week. She is compliant with trelegy. She is compliant with O2 at 3L [place] continuously. She denies any fever, chest pain, or edema.   No Known Allergies  Immunization History  Administered Date(s) Administered  . Influenza Split 11/23/2012, 11/14/2014  . Influenza, High Dose Seasonal PF 11/16/2015, 11/10/2016  . Influenza,inj,Quad PF,6+ Mos 11/11/2013  . Influenza-Unspecified 11/17/2017  . Pneumococcal Conjugate-13 12/11/2013  . Pneumococcal Polysaccharide-23 11/15/2002  . Tdap 11/10/2011    Past Medical History:  Diagnosis Date  . Allergic rhinitis   . Arthritis   . Breast calcification seen on mammogram    Right breast  . Breast cancer ([place])   . COPD (chronic obstructive pulmonary disease) ([place])   . GERD (gastroesophageal reflux disease)   . Hyperglycemia   . Hyperlipidemia   . Hypertension   . Low back pain   . On home oxygen therapy    all the time  . Osteopenia   . Peripheral edema   . Shortness of breath   . Subclavian artery stenosis, left ([place])   . Vitamin D deficiency   . Wears dentures    top  . Wears glasses    reading    Tobacco History: Social History   Tobacco Use  Smoking Status Former Smoker  . Packs/day: 1.00  . Years: 40.00  . Pack years: 40.00  . Types: Cigarettes  . Last attempt to quit: 02/14/1994  . Years since quitting: 23.9  Smokeless Tobacco Never Used   Counseling given: Yes   Outpatient Encounter Medications as of 01/29/2018  Medication Sig  . acetaminophen (TYLENOL) 325 MG tablet Take 650 mg by mouth every 6 (six) hours as needed.  Marland Kitchen anastrozole (ARIMIDEX) 1 MG tablet Take 1 tablet (1 mg total) by mouth daily.  . Cholecalciferol (VITAMIN D) 2000 UNITS CAPS Take 1 capsule by mouth daily.  . Cyanocobalamin (VITAMIN B 12 PO) Take 1 tablet by mouth daily.  . DiphenhydrAMINE HCl (BENADRYL PO) Take 1 tablet by mouth as needed.  . Fluticasone-Umeclidin-Vilant (TRELEGY ELLIPTA) 100-62.5-25 MCG/INH AEPB Inhale 1 puff into the lungs daily.  . Fluticasone-Umeclidin-Vilant (TRELEGY ELLIPTA) 100-62.5-25 MCG/INH AEPB Inhale 1 puff into the lungs daily.  . furosemide (LASIX) 40 MG tablet Take 1 tablet (40 mg total) by mouth 2 (two) times daily. <PLEASE MAKE APPOINTMENT FOR REFILLS> (Patient taking differently: Take 40 mg by mouth daily. <PLEASE MAKE APPOINTMENT FOR REFILLS>)  . KLOR-CON M20 20 MEQ tablet TAKE 1 TABLET BY MOUTH DAILY  . lisinopril (  PRINIVIL,ZESTRIL) 40 MG tablet Take 40 mg by mouth daily.  Marland Kitchen lovastatin (MEVACOR) 40 MG tablet Take 40 mg by mouth at bedtime.  Marland Kitchen omeprazole (PRILOSEC) 20 MG capsule Take 20 mg by mouth daily.  Marland Kitchen PROAIR HFA 108 (90 Base) MCG/ACT inhaler TAKE 2 PUFFS BY MOUTH EVERY 6 HOURS AS NEEDED FOR WHEEZE OR SHORTNESS OF BREATH  . azithromycin (ZITHROMAX) 250 MG tablet Take 2 tablets (500 mg) on day 1, then take 1 tablet (250 mg) on days 2-5  .  Fluticasone-Umeclidin-Vilant (TRELEGY ELLIPTA) 100-62.5-25 MCG/INH AEPB Inhale 1 puff into the lungs daily.  . predniSONE (DELTASONE) 10 MG tablet Take 4 tabs for 2 days, then 3 tabs for 2 days, then 2 tabs for 2 days, then 1 tab for 2 days, then stop   No facility-administered encounter medications on file as of 01/29/2018.      Review of Systems  Review of Systems  Constitutional: Negative.  Negative for chills and fever.  HENT: Positive for congestion, postnasal drip, sinus pressure, sinus pain, sore throat and voice change. Negative for ear pain.   Respiratory: Positive for cough. Negative for shortness of breath and wheezing.   Cardiovascular: Negative.  Negative for chest pain and leg swelling.  Gastrointestinal: Negative.   Allergic/Immunologic: Negative.   Neurological: Negative.   Psychiatric/Behavioral: Negative.        Physical Exam  BP 130/64 (BP Location: Left Arm, Patient Position: Sitting, Cuff Size: Normal)   Pulse 99   Ht 4' 10.86" (1.495 m)   Wt 177 lb (80.3 kg)   SpO2 95% Comment: on 3L of O2  BMI 35.92 kg/m   Wt Readings from Last 5 Encounters:  01/29/18 177 lb (80.3 kg)  12/01/17 183 lb (83 kg)  07/17/17 183 lb 12.8 oz (83.4 kg)  06/30/17 185 lb 9.6 oz (84.2 kg)  06/15/17 186 lb 6.4 oz (84.6 kg)     Physical Exam Vitals signs and nursing note reviewed.  Constitutional:      General: She is not in acute distress.    Appearance: She is well-developed.  Cardiovascular:     Rate and Rhythm: Normal rate and regular rhythm.  Pulmonary:     Effort: Pulmonary effort is normal.     Breath sounds: Normal breath sounds. No decreased breath sounds, wheezing or rhonchi.  Musculoskeletal:     Right lower leg: No edema.  Neurological:     Mental Status: She is alert and oriented to person, place, and time.      Imaging: Ct Abdomen Pelvis Wo Contrast  Result Date: 01/15/2018 CLINICAL DATA:  84 year old female with lower abdominal pain and  constipation for the past week. Breast cancer post lumpectomy and radiation seed implant 2015. Stage 3 chronic kidney disease. Initial encounter. EXAM: CT ABDOMEN AND PELVIS WITHOUT CONTRAST TECHNIQUE: Multidetector CT imaging of the abdomen and pelvis was performed following the standard protocol without IV contrast. COMPARISON:  No comparison CT abdomen pelvis. FINDINGS: Lower chest: Septal thickening. Minimal bronchiectasis. Bullae and slight nodularity lung bases. Heart size within normal limits. Mitral valve calcification. Prominent epicardial fat. Hepatobiliary: Taking into account limitation by non contrast imaging, no worrisome hepatic lesion. Minimally lobulated contour without other findings of cirrhosis. Several large gallstones measuring up to 1.9 cm. No CT evidence of cholecystitis. Pancreas: Taking into account limitation by non contrast imaging, no worrisome pancreatic mass or inflammation. Spleen: Taking into account limitation by non contrast imaging, no splenic mass or enlargement. Adrenals/Urinary Tract: No obstructing stone or hydronephrosis.  Taking into account limitation by non contrast imaging, no worrisome renal mass. Very small low-density nodules right adrenal gland suggestive of adenomas. No left adrenal gland abnormality. Noncontrast filled imaging of the urinary bladder reveals mild urinary bladder prolapse otherwise negative. Stomach/Bowel: Prominent diverticulosis most notable descending colon and sigmoid colon. Significant associated muscular hypertrophy in the region of the sigmoid colon. No extraluminal bowel inflammatory process noted to suggest diverticulitis. No inflammation surrounds the appendix. No acute gastric or small bowel abnormality noted. Vascular/Lymphatic: Atherosclerotic changes aorta and aortic branch vessels. No abdominal aortic aneurysm. Scattered normal size lymph nodes. Reproductive: No worrisome adnexal or uterine abnormality. Other: No free air or bowel  containing hernia. Musculoskeletal: Scoliosis lumbar spine convex right with superimposed degenerative changes most notable L4-5. Mild hip joint degenerative changes. IMPRESSION: 1. Prominent diverticulosis most notable descending colon and sigmoid colon. Significant associated muscular hypertrophy in the region of the sigmoid colon. No extraluminal bowel inflammatory process noted to suggest diverticulitis. 2. Several large gallstones measuring up to 1.9 cm. No CT evidence of cholecystitis. 3. Very small low-density nodules right adrenal gland suggestive of adenomas. 4. Mild urinary bladder prolapse. 5. Basilar parenchymal with minimal nodularity. As patient has a history of breast cancer (and no comparison chest CTs), baseline noncontrast chest CT at the present time may be considered to establish patient's baseline. 6.  Aortic Atherosclerosis (ICD10-I70.0). 7. Scoliosis lumbar spine convex right with superimposed degenerative changes most notable L4-5. Electronically Signed   By: Genia Del M.D.   On: 01/15/2018 16:18     Assessment & Plan:   Chronic hypoxemic respiratory failure (HCC) Continue O2 at 3 L [place] continuously. May increase to 4L with exertion.  Sinusitis Patient Instructions  Will order azithromycin Will order prednisone taper Continue O2 at 3L [place] and may increase to 4L with exertion Will give samples of mucinex Will give samples of trelegy  Follow up with Dr. Lake Bells in 1 month.  Please call the office if symptoms worsen    COPD exacerbation ([place]) Patient Instructions  Will order azithromycin Will order prednisone taper Continue O2 at 3L [place] and may increase to 4L with exertion Will give samples of mucinex Will give samples of trelegy  Follow up with Dr. Lake Bells in 1 month.  Please call the office if symptoms worsen       Fenton Foy, NP 01/29/2018

## 2018-01-29 NOTE — Assessment & Plan Note (Signed)
Patient Instructions  Will order azithromycin Will order prednisone taper Continue O2 at 3L [place] and may increase to 4L with exertion Will give samples of mucinex Will give samples of trelegy  Follow up with Dr. Lake Bells in 1 month.  Please call the office if symptoms worsen

## 2018-01-29 NOTE — Assessment & Plan Note (Signed)
Continue O2 at 3 L [place] continuously. May increase to 4L with exertion.

## 2018-01-29 NOTE — Patient Instructions (Addendum)
Will order azithromycin Will order prednisone taper Continue O2 at 3L [place] and may increase to 4L with exertion Will give samples of mucinex Will give samples of trelegy  Follow up with Dr. Lake Bells in 1 month.  Please call the office if symptoms worsen

## 2018-01-29 NOTE — Assessment & Plan Note (Signed)
Patient Instructions  Will order azithromycin Will order prednisone taper Continue O2 at 3L [place] and may increase to 4L with exertion Will give samples of mucinex Will give samples of trelegy  Follow up with Dr. Lake Bells in 1 month.  Please call the office if symptoms worsen

## 2018-01-30 NOTE — Progress Notes (Signed)
Reviewed, agree 

## 2018-01-31 ENCOUNTER — Other Ambulatory Visit: Payer: Self-pay | Admitting: Hematology

## 2018-01-31 DIAGNOSIS — Z171 Estrogen receptor negative status [ER-]: Principal | ICD-10-CM

## 2018-01-31 DIAGNOSIS — C50211 Malignant neoplasm of upper-inner quadrant of right female breast: Secondary | ICD-10-CM

## 2018-02-01 DIAGNOSIS — D509 Iron deficiency anemia, unspecified: Secondary | ICD-10-CM | POA: Diagnosis not present

## 2018-02-01 DIAGNOSIS — K5790 Diverticulosis of intestine, part unspecified, without perforation or abscess without bleeding: Secondary | ICD-10-CM | POA: Diagnosis not present

## 2018-02-12 ENCOUNTER — Ambulatory Visit: Payer: PPO | Admitting: Acute Care

## 2018-02-12 ENCOUNTER — Ambulatory Visit (INDEPENDENT_AMBULATORY_CARE_PROVIDER_SITE_OTHER)
Admission: RE | Admit: 2018-02-12 | Discharge: 2018-02-12 | Disposition: A | Discharge status: Home / Self Care | Payer: PPO | Source: Ambulatory Visit | Attending: Acute Care | Admitting: Acute Care

## 2018-02-12 ENCOUNTER — Telehealth: Payer: Self-pay | Admitting: Acute Care

## 2018-02-12 ENCOUNTER — Encounter: Payer: Self-pay | Admitting: Acute Care

## 2018-02-12 VITALS — BP 142/60 | HR 124 | Temp 97.7°F | Ht <= 58 in | Wt 171.6 lb

## 2018-02-12 DIAGNOSIS — J441 Chronic obstructive pulmonary disease with (acute) exacerbation: Secondary | ICD-10-CM

## 2018-02-12 DIAGNOSIS — I10 Essential (primary) hypertension: Secondary | ICD-10-CM

## 2018-02-12 DIAGNOSIS — J9611 Chronic respiratory failure with hypoxia: Secondary | ICD-10-CM

## 2018-02-12 DIAGNOSIS — R0602 Shortness of breath: Secondary | ICD-10-CM | POA: Diagnosis not present

## 2018-02-12 DIAGNOSIS — J301 Allergic rhinitis due to pollen: Secondary | ICD-10-CM | POA: Diagnosis not present

## 2018-02-12 DIAGNOSIS — R05 Cough: Secondary | ICD-10-CM | POA: Diagnosis not present

## 2018-02-12 MED ORDER — PREDNISONE 10 MG PO TABS: ORAL_TABLET | ORAL | 0 refills | Status: DC

## 2018-02-12 NOTE — Progress Notes (Signed)
History of Present Illness Courtney Cline is a 84 y.o. female former smoker ( Quit 1996 with a 40 pack year smoking history with GOLD Grade D COPD, on home oxygen. She is followed by Dr. Lake Bells   02/12/2018  Acute OV Pt. Presents for acute OV. She was last seen in the office 01/29/2018 for COPD exacerbation. At that time she was complaining of chest congestion, post nasal drip , sinus pressure and pain, and cough with yellow sputum. She is maintained on Trelegy , and she wears oxygen at 3 L continuously with 4 L with exertion. She was treated with Z pack and prednisone taper. Pt. Completed both of the medications prescribed at the previous OV.Today she has all of the above symptoms , in addition to  having headaches. She also complains of hoarseness of voice.She states she has worsening shortness of breath with exertion. She denies fever or chills, but she states she does always feel cold. She is afebrile in the office today. She states she has had some wheezing. She has had cough that is non-productive , with occasional small amounts of white to yellow secretions. She is complaining of post nasal drip which she feels is making her cough. She denies fever, chest pain, orthopnea or hemoptysis.  Of note her BP was elevated in the office. She states she has been having some headaches. I have asked her to follow up with her PCP re: her elevated BP. She verbalized understanding.  Test Results: CXR 02/12/2018 Chronic bronchitic changes, stable. No alveolar pneumonia nor pulmonary edema.  [place]: 2016 FEV1 31% pred on 3 L continuously>> Very severe obstruction, with low vital capacity. 2015 FEV1 33% pred, on 2L O2 continuously  Chest imaging: 05/2016 CXR images independently reviewed: Hyperinflation consistent with COPD, no pulmonary parenchymal abnormality 06/30/17 CXR Underlying COPD, stable. Mild scarring in the bases. No edema or consolidation. Stable cardiac silhouette. There is aortic  atherosclerosis.   CBC Latest Ref Rng & Units 06/15/2017 11/07/2016 08/04/2016  WBC 3.9 - 10.3 K/uL 6.7 5.4 6.5  Hemoglobin 11.6 - 15.9 g/dL 9.5(L) 9.6(L) 10.3(L)  Hematocrit 34.8 - 46.6 % 30.7(L) 31.0(L) 31.7(L)  Platelets 145 - 400 K/uL 187 205 257.0    BMP Latest Ref Rng & Units 07/17/2017 06/15/2017 11/07/2016  Glucose 70 - 99 mg/dL 106(H) 120 118  BUN 6 - 23 mg/dL 26(H) 31(H) 28.2(H)  Creatinine 0.40 - 1.20 mg/dL 1.39(H) 1.45(H) 1.3(H)  Sodium 135 - 145 mEq/L 144 142 142  Potassium 3.5 - 5.1 mEq/L 4.3 4.6 4.3  Chloride 96 - 112 mEq/L 104 102 -  CO2 19 - 32 mEq/L 32 31(H) 30(H)  Calcium 8.4 - 10.5 mg/dL 9.5 10.1 9.7    BNP No results found for: BNP  ProBNP    Component Value Date/Time   PROBNP 29.0 07/17/2017 1501    PFT No results found for: FEV1PRE, FEV1POST, FVCPRE, FVCPOST, TLC, DLCOUNC, PREFEV1FVCRT, PSTFEV1FVCRT  Ct Abdomen Pelvis Wo Contrast  Result Date: 01/15/2018 CLINICAL DATA:  84 year old female with lower abdominal pain and constipation for the past week. Breast cancer post lumpectomy and radiation seed implant 2015. Stage 3 chronic kidney disease. Initial encounter. EXAM: CT ABDOMEN AND PELVIS WITHOUT CONTRAST TECHNIQUE: Multidetector CT imaging of the abdomen and pelvis was performed following the standard protocol without IV contrast. COMPARISON:  No comparison CT abdomen pelvis. FINDINGS: Lower chest: Septal thickening. Minimal bronchiectasis. Bullae and slight nodularity lung bases. Heart size within normal limits. Mitral valve calcification. Prominent epicardial fat. Hepatobiliary: Taking  into account limitation by non contrast imaging, no worrisome hepatic lesion. Minimally lobulated contour without other findings of cirrhosis. Several large gallstones measuring up to 1.9 cm. No CT evidence of cholecystitis. Pancreas: Taking into account limitation by non contrast imaging, no worrisome pancreatic mass or inflammation. Spleen: Taking into account limitation by non  contrast imaging, no splenic mass or enlargement. Adrenals/Urinary Tract: No obstructing stone or hydronephrosis. Taking into account limitation by non contrast imaging, no worrisome renal mass. Very small low-density nodules right adrenal gland suggestive of adenomas. No left adrenal gland abnormality. Noncontrast filled imaging of the urinary bladder reveals mild urinary bladder prolapse otherwise negative. Stomach/Bowel: Prominent diverticulosis most notable descending colon and sigmoid colon. Significant associated muscular hypertrophy in the region of the sigmoid colon. No extraluminal bowel inflammatory process noted to suggest diverticulitis. No inflammation surrounds the appendix. No acute gastric or small bowel abnormality noted. Vascular/Lymphatic: Atherosclerotic changes aorta and aortic branch vessels. No abdominal aortic aneurysm. Scattered normal size lymph nodes. Reproductive: No worrisome adnexal or uterine abnormality. Other: No free air or bowel containing hernia. Musculoskeletal: Scoliosis lumbar spine convex right with superimposed degenerative changes most notable L4-5. Mild hip joint degenerative changes. IMPRESSION: 1. Prominent diverticulosis most notable descending colon and sigmoid colon. Significant associated muscular hypertrophy in the region of the sigmoid colon. No extraluminal bowel inflammatory process noted to suggest diverticulitis. 2. Several large gallstones measuring up to 1.9 cm. No CT evidence of cholecystitis. 3. Very small low-density nodules right adrenal gland suggestive of adenomas. 4. Mild urinary bladder prolapse. 5. Basilar parenchymal with minimal nodularity. As patient has a history of breast cancer (and no comparison chest CTs), baseline noncontrast chest CT at the present time may be considered to establish patient's baseline. 6.  Aortic Atherosclerosis (ICD10-I70.0). 7. Scoliosis lumbar spine convex right with superimposed degenerative changes most notable L4-5.  Electronically Signed   By: Genia Del M.D.   On: 01/15/2018 16:18   Dg Chest 2 View  Result Date: 02/12/2018 CLINICAL DATA:  Cough and shortness of breath. History of COPD, CHF, former smoker. EXAM: CHEST - 2 VIEW COMPARISON:  PA and lateral chest x-ray of Jun 30, 2017 FINDINGS: The lungs remain hyperinflated. There is no focal infiltrate. There are coarse lung markings in the retrocardiac region bilaterally which are stable. The heart and pulmonary vascularity are normal. There is calcification in the wall of the aortic arch. There is no pleural effusion. The bony thorax exhibits no acute abnormality. IMPRESSION: Chronic bronchitic changes, stable. No alveolar pneumonia nor pulmonary edema. Thoracic aortic atherosclerosis. Electronically Signed   By: David  Martinique M.D.   On: 02/12/2018 12:00     Past medical hx Past Medical History:  Diagnosis Date  . Allergic rhinitis   . Arthritis   . Breast calcification seen on mammogram    Right breast  . Breast cancer ([place])   . COPD (chronic obstructive pulmonary disease) ([place])   . GERD (gastroesophageal reflux disease)   . Hyperglycemia   . Hyperlipidemia   . Hypertension   . Low back pain   . On home oxygen therapy    all the time  . Osteopenia   . Peripheral edema   . Shortness of breath   . Subclavian artery stenosis, left ([place])   . Vitamin D deficiency   . Wears dentures    top  . Wears glasses    reading     Social History   Tobacco Use  . Smoking status: Former Smoker  Packs/day: 1.00    Years: 40.00    Pack years: 40.00    Types: Cigarettes    Last attempt to quit: 02/14/1994    Years since quitting: 24.0  . Smokeless tobacco: Never Used  Substance Use Topics  . Alcohol use: No  . Drug use: No    Ms.Rod reports that she quit smoking about 24 years ago. Her smoking use included cigarettes. She has a 40.00 pack-year smoking history. She has never used smokeless tobacco. She reports that she does not drink  alcohol or use drugs.  Tobacco Cessation: Former smoker quit 1996 with a 40 pack year smoking history  Past surgical hx, Family hx, Social hx all reviewed.  Current Outpatient Medications on File Prior to Visit  Medication Sig  . acetaminophen (TYLENOL) 325 MG tablet Take 650 mg by mouth every 6 (six) hours as needed.  Marland Kitchen anastrozole (ARIMIDEX) 1 MG tablet TAKE 1 TABLET BY MOUTH EVERY DAY  . Cholecalciferol (VITAMIN D) 2000 UNITS CAPS Take 1 capsule by mouth daily.  . Cyanocobalamin (VITAMIN B 12 PO) Take 1 tablet by mouth daily.  . DiphenhydrAMINE HCl (BENADRYL PO) Take 1 tablet by mouth as needed.  . Fluticasone-Umeclidin-Vilant (TRELEGY ELLIPTA) 100-62.5-25 MCG/INH AEPB Inhale 1 puff into the lungs daily.  . Fluticasone-Umeclidin-Vilant (TRELEGY ELLIPTA) 100-62.5-25 MCG/INH AEPB Inhale 1 puff into the lungs daily.  . Fluticasone-Umeclidin-Vilant (TRELEGY ELLIPTA) 100-62.5-25 MCG/INH AEPB Inhale 1 puff into the lungs daily.  . furosemide (LASIX) 40 MG tablet Take 1 tablet (40 mg total) by mouth 2 (two) times daily. <PLEASE MAKE APPOINTMENT FOR REFILLS> (Patient taking differently: Take 40 mg by mouth daily. <PLEASE MAKE APPOINTMENT FOR REFILLS>)  . KLOR-CON M20 20 MEQ tablet TAKE 1 TABLET BY MOUTH DAILY  . lisinopril (PRINIVIL,ZESTRIL) 40 MG tablet Take 40 mg by mouth daily.  Marland Kitchen lovastatin (MEVACOR) 40 MG tablet Take 40 mg by mouth at bedtime.  Marland Kitchen omeprazole (PRILOSEC) 20 MG capsule Take 20 mg by mouth daily.  Marland Kitchen PROAIR HFA 108 (90 Base) MCG/ACT inhaler TAKE 2 PUFFS BY MOUTH EVERY 6 HOURS AS NEEDED FOR WHEEZE OR SHORTNESS OF BREATH   No current facility-administered medications on file prior to visit.      No Known Allergies  Review Of Systems:  Constitutional:   No  weight loss, night sweats,  Fevers, + chills, + fatigue, or  lassitude.  HEENT:   No headaches,  Difficulty swallowing,  Tooth/dental problems, or  Sore throat,                No sneezing, itching, ear ache, + nasal  congestion, + post nasal drip,   CV:  No chest pain,  Orthopnea, PND, swelling in lower extremities, anasarca, dizziness, palpitations, syncope.   GI  No heartburn, indigestion, abdominal pain, nausea, vomiting, diarrhea, change in bowel habits, loss of appetite, bloody stools.   Resp: + baseline  shortness of breath with exertion less  at rest.  + excess mucus, + productive cough,  No non-productive cough,  No coughing up of blood.  No change in color of mucus.  + wheezing.  No chest wall deformity  Skin: no rash or lesions.  GU: no dysuria, change in color of urine, no urgency or frequency.  No flank pain, no hematuria   MS:  No joint pain or swelling.  No decreased range of motion.  No back pain.  Psych:  No change in mood or affect. No depression or anxiety.  No memory loss.  Vital Signs BP (!) 142/60 (BP Location: Left Arm, Cuff Size: Normal)   Pulse (!) 124   Temp 97.7 F (36.5 C) (Oral)   Ht 4\' 10"  (1.473 m)   Wt 171 lb 9.6 oz (77.8 kg)   SpO2 93%   BMI 35.86 kg/m    Physical Exam:  General- No distress,  A&Ox3, pleasant  ENT: No sinus tenderness, TM clear, pale nasal mucosa, no oral exudate,+ post nasal drip, no LAN Cardiac: S1, S2, regular rate and rhythm, no murmur Chest: Few exp  wheeze/ No rales/ dullness; no accessory muscle use, no nasal flaring, no sternal retractions Abd.: Soft Non-tender, ND. BS +. Body mass index is 35.86 kg/m. Ext: No clubbing cyanosis, edema, no obvious deformities Neuro:  normal strength, MAE x 4, A&O x 3, appropriate Skin: No rashes, warm and dry Psych: normal mood and behavior   Assessment/Plan  Essential hypertension Elevated today in the office. She states she has been having Headaches on and off Plan Follow up with PCP/ cards  to adjust  BP medications Monitor BP at home  Seek emergency care for persistently elevated BP and HA    Allergic rhinitis Allergic rhinitis causing cough Treated with z pack and pred taper 12/16  for ? COPD exacerbation No improvement in symptoms CXR No acute issues Plan CXR today We will call you with results Continue your Trelegy once daily as you have been doing. Rinse mouth after use Zyrtec 10 mg  ( ok to use generic) once daily for post nasal gtt Flonase ( Fluticasone) 2 puff up each nare  Once daily Prednisone taper; 10 mg tablets: 4 tabs x 2 days, 3 tabs x 2 days, 2 tabs x 2 days 1 tab x 2 days then stop. Sips of water instead of throat clearing Sugar Free [employer] or [employer] originals for throat soothing. Avoid mint and menthol Mucinex DM liquid take as directed Non-sedating antihistamine of your choice daily ( Zyrtec, Allegra, Xyzol, Claritin ( Generic ok) Follow up in 3 weeks with Judson Roch NP or Dr. Lake Bells to ensure you are better. Please contact office for sooner follow up if symptoms do not improve or worsen or seek emergency care  Follow up with PCP re elevated BP   Chronic hypoxemic respiratory failure (HCC) Continue oxygen at 3 L at rest, and 4 L with exertion Saturation goals are 88-92%  COPD exacerbation (HCC) Slow to resolve No purulent secretions No fever CXR clear Plan CXR today We will call you with results Continue your Trelegy once daily as you have been doing. Prednisone taper; 10 mg tablets: 4 tabs x 2 days, 3 tabs x 2 days, 2 tabs x 2 days 1 tab x 2 days then stop. Follow up in 3 weeks with Judson Roch NP or Dr. Lake Bells to ensure you are better. Please contact office for sooner follow up if symptoms do not improve or worsen or seek emergency care  Follow up with PCP re elevated BP     Magdalen Spatz, NP 02/12/2018  4:45 PM

## 2018-02-12 NOTE — Telephone Encounter (Signed)
Please call patient and let her know her CXR did not show any pneumonia. Have her follow the plan of care developed in the office today with follow up as we discussed. Thanks so much

## 2018-02-12 NOTE — Assessment & Plan Note (Signed)
Slow to resolve No purulent secretions No fever CXR clear Plan CXR today We will call you with results Continue your Trelegy once daily as you have been doing. Prednisone taper; 10 mg tablets: 4 tabs x 2 days, 3 tabs x 2 days, 2 tabs x 2 days 1 tab x 2 days then stop. Follow up in 3 weeks with Judson Roch NP or Dr. Lake Bells to ensure you are better. Please contact office for sooner follow up if symptoms do not improve or worsen or seek emergency care  Follow up with PCP re elevated BP

## 2018-02-12 NOTE — Assessment & Plan Note (Signed)
Allergic rhinitis causing cough Treated with z pack and pred taper 12/16 for ? COPD exacerbation No improvement in symptoms CXR No acute issues Plan CXR today We will call you with results Continue your Trelegy once daily as you have been doing. Rinse mouth after use Zyrtec 10 mg  ( ok to use generic) once daily for post nasal gtt Flonase ( Fluticasone) 2 puff up each nare  Once daily Prednisone taper; 10 mg tablets: 4 tabs x 2 days, 3 tabs x 2 days, 2 tabs x 2 days 1 tab x 2 days then stop. Sips of water instead of throat clearing Sugar Free [employer] or [employer] originals for throat soothing. Avoid mint and menthol Mucinex DM liquid take as directed Non-sedating antihistamine of your choice daily ( Zyrtec, Allegra, Xyzol, Claritin ( Generic ok) Follow up in 3 weeks with Judson Roch NP or Dr. Lake Bells to ensure you are better. Please contact office for sooner follow up if symptoms do not improve or worsen or seek emergency care  Follow up with PCP re elevated BP

## 2018-02-12 NOTE — Telephone Encounter (Signed)
Advised pt of results. Pt understood and nothing further is needed.   

## 2018-02-12 NOTE — Assessment & Plan Note (Signed)
Elevated today in the office. She states she has been having Headaches on and off Plan Follow up with PCP/ cards  to adjust  BP medications Monitor BP at home  Seek emergency care for persistently elevated BP and HA

## 2018-02-12 NOTE — Assessment & Plan Note (Signed)
Continue oxygen at 3 L at rest, and 4 L with exertion Saturation goals are 88-92%

## 2018-02-12 NOTE — Patient Instructions (Addendum)
CXR today We will call you with results Continue your Trelegy once daily as you have been doing. Zyrtec ( ok to use generic) once daily Flonase ( Fluticasone) 2 puff up each nare  Once daily Prednisone taper; 10 mg tablets: 4 tabs x 2 days, 3 tabs x 2 days, 2 tabs x 2 days 1 tab x 2 days then stop. Sips of water instead of throat clearing Sugar Free [employer] or [employer] originals for throat soothing. Avoid mint and menthol Mucinex DM liquid take as directed Non-sedating antihistamine of your choice daily ( Zyrtec, Allegra, Xyzol, Claritin ( Generic ok) Follow up in 3 weeks with Judson Roch NP or Dr. Lake Bells to ensure you are better. Please contact office for sooner follow up if symptoms do not improve or worsen or seek emergency care  Follow up with PCP re elevated BP

## 2018-02-14 NOTE — Progress Notes (Signed)
Reviewed, agree 

## 2018-03-28 ENCOUNTER — Telehealth: Payer: Self-pay | Admitting: Pulmonary Disease

## 2018-03-28 ENCOUNTER — Other Ambulatory Visit: Payer: Self-pay | Admitting: Pulmonary Disease

## 2018-03-28 NOTE — Telephone Encounter (Signed)
Spoke with pt, requesting refill on trelegy.  I sent this refill via rx request this morning, confirmed with pharmacy that refill was received.  Pt aware.  Nothing further needed at this time- will close encounter.

## 2018-04-10 DIAGNOSIS — D509 Iron deficiency anemia, unspecified: Secondary | ICD-10-CM | POA: Diagnosis not present

## 2018-04-17 DIAGNOSIS — D509 Iron deficiency anemia, unspecified: Secondary | ICD-10-CM | POA: Diagnosis not present

## 2018-04-23 ENCOUNTER — Other Ambulatory Visit (HOSPITAL_COMMUNITY): Payer: Self-pay

## 2018-04-24 ENCOUNTER — Ambulatory Visit (HOSPITAL_COMMUNITY)
Admission: RE | Admit: 2018-04-24 | Discharge: 2018-04-24 | Disposition: A | Discharge status: Home / Self Care | Payer: PPO | Source: Ambulatory Visit | Attending: Gastroenterology | Admitting: Gastroenterology

## 2018-04-24 DIAGNOSIS — D500 Iron deficiency anemia secondary to blood loss (chronic): Secondary | ICD-10-CM | POA: Insufficient documentation

## 2018-04-24 MED ORDER — SODIUM CHLORIDE 0.9 % IV SOLN
510.0000 mg | Freq: Once | INTRAVENOUS | Status: AC
  Administered 2018-04-24: 510 mg via INTRAVENOUS
  Filled 2018-04-24: qty 510

## 2018-04-24 NOTE — Discharge Instructions (Signed)

## 2018-05-22 ENCOUNTER — Encounter (HOSPITAL_COMMUNITY): Payer: PPO

## 2018-05-24 DIAGNOSIS — D509 Iron deficiency anemia, unspecified: Secondary | ICD-10-CM | POA: Diagnosis not present

## 2018-05-30 ENCOUNTER — Telehealth: Payer: Self-pay | Admitting: Hematology

## 2018-05-30 NOTE — Telephone Encounter (Signed)
Changed 5/1 appt to telephone call per sch msg after calling and speaking with patient to confirm that this was ok.

## 2018-06-07 DIAGNOSIS — E785 Hyperlipidemia, unspecified: Secondary | ICD-10-CM | POA: Diagnosis not present

## 2018-06-07 DIAGNOSIS — Z9981 Dependence on supplemental oxygen: Secondary | ICD-10-CM | POA: Diagnosis not present

## 2018-06-07 DIAGNOSIS — D509 Iron deficiency anemia, unspecified: Secondary | ICD-10-CM | POA: Diagnosis not present

## 2018-06-07 DIAGNOSIS — J449 Chronic obstructive pulmonary disease, unspecified: Secondary | ICD-10-CM | POA: Diagnosis not present

## 2018-06-07 DIAGNOSIS — I10 Essential (primary) hypertension: Secondary | ICD-10-CM | POA: Diagnosis not present

## 2018-06-12 ENCOUNTER — Telehealth: Payer: Self-pay | Admitting: Pulmonary Disease

## 2018-06-12 NOTE — Telephone Encounter (Signed)
LVM for Rose at [facility] [phone]) to call back to clarify as to who was requesting the chest CT. I did not see a message where the patient called into our office (Pulmonary) with any issues, no future orders seen for this test. Also asked for reason CT was needed.

## 2018-06-14 NOTE — Progress Notes (Signed)
[place]   [phone] [phone]) [phone]   Clinic Follow up Note   Patient Care Team: Leeroy Cha, MD as PCP - General (Internal Medicine) Juanito Doom, MD as Consulting Physician (Pulmonary Disease) Truitt Merle, MD as Consulting Physician (Hematology) Erroll Luna, MD as Consulting Physician (General Surgery)   I connected with Courtney Cline on 06/15/2018 at  1:45 PM EDT by telephone visit and verified that I am speaking with the correct person using two identifiers.  I discussed the limitations, risks, security and privacy concerns of performing an evaluation and management service by telephone and the availability of in person appointments. I also discussed with the patient that there may be a patient responsible charge related to this service. The patient expressed understanding and agreed to proceed.   Patient's location:  At home  Provider's location:  My office   CHIEF COMPLAINT: Follow up of right breast cancer   SUMMARY OF ONCOLOGIC HISTORY: Oncology History   Breast cancer of upper-inner quadrant of right female breast   Staging form: Breast, AJCC 7th Edition     Clinical: Stage Unknown (T1c, NX, cM0) - Signed by Glean Salvo, MD on 11/19/2013       Staging comments: ER 100%+, PR 100%+, Ki-67 7%        Breast cancer of upper-inner quadrant of right female breast ([place])   12/06/2012 Imaging    Bone Density performed at [facility] physicians T score Lumbar spine -0.1 (-0.7 in 2012), Right neck femur -1.4 (-1.2 before), Left neck femur -1.4 (-1.5 before) and Interpretation is OSTEOPENIA by WHO criteria.     10/11/2013 Mammogram    Abnormal Screening mammogram showing Right Breast mass.    10/23/2013 Breast US    Diagnostic mammogram and US showed an irregular hypoechoic mass at 9'0 clock position right breast 8 cm from nipple. [place] measurement 1.2x0.7x1.1 cm, no axillary adenopathy.    10/23/2013 Initial Biopsy    US  guided biopsy  done with clip placement.    10/23/2013 Pathology Results    Invasive ductal carcinoma (IDC), DCIS, invasive cancer grade 2, ER 100%+, PR 100%+, Ki-67% 7%, HER-2/NEU by CISH No amplification, ratio of her2:cep17 1.00, average her2 copy number per cell 1.95 (HER 2 Negative tumor). Molecular Classification LUMINAL A.    11/01/2013 Surgery    Initial surgical evaluation by Dr Marcello Moores [place]: "Not good surgical candidate because of pulmonary status". Recommended anti-estrogen therapy.    11/18/2013 -  Anti-estrogen oral therapy    Anastrozole 1 mg once daily    12/20/2013 Surgery    right breast lumpectomy without SLN biopsy, negative margins     12/20/2013 Pathologic Stage    pT1cNxMx, G2, LVI (-), tumor measures 1.2cm. Surgical margins are negative.      10/18/2016 Imaging    MM DIAG BREAST TOMO BILATERAL 10/18/16 IMPRESSION: No mammographic evidence of malignancy involving either breast. Expected post lumpectomy changes in the right breast.      CURRENT THERAPY:  Anastrozole 33m daily, started on 11/18/2013. Plan to complete in 11/2018  INTERVAL HISTORY:  Courtney Cline here for a follow up of right breast cancer. She was able to identify herself by birth date. She notes she is doing well. She notes she was having bowel uneasiness.  She notes her COPD is stable. She is on oxygen 3L.    REVIEW OF SYSTEMS:   Constitutional: Denies fevers, chills or abnormal weight loss Eyes: Denies blurriness of vision Ears, nose,  mouth, throat, and face: Denies mucositis or sore throat Respiratory: Denies cough, dyspnea or wheezes Cardiovascular: Denies palpitation, chest discomfort or lower extremity swelling Gastrointestinal:  Denies nausea, heartburn or change in bowel habits Skin: Denies abnormal skin rashes Lymphatics: Denies new lymphadenopathy or easy bruising Neurological:Denies numbness, tingling or new weaknesses Behavioral/Psych: Mood is stable, no new changes  All  other systems were reviewed with the patient and are negative.  MEDICAL HISTORY:  Past Medical History:  Diagnosis Date   Allergic rhinitis    Arthritis    Breast calcification seen on mammogram    Right breast   Breast cancer (HCC)    COPD (chronic obstructive pulmonary disease) (HCC)    GERD (gastroesophageal reflux disease)    Hyperglycemia    Hyperlipidemia    Hypertension    Low back pain    On home oxygen therapy    all the time   Osteopenia    Peripheral edema    Shortness of breath    Subclavian artery stenosis, left ([place])    Vitamin D deficiency    Wears dentures    top   Wears glasses    reading    SURGICAL HISTORY: Past Surgical History:  Procedure Laterality Date   BREAST BIOPSY     BREAST LUMPECTOMY     right 2015   BREAST LUMPECTOMY WITH RADIOACTIVE SEED LOCALIZATION Right 12/20/2013   Procedure: RIGHT BREAST LUMPECTOMY WITH RADIOACTIVE SEED LOCALIZATION;  Surgeon: Erroll Luna, MD;  Location: [facility];  Service: General;  Laterality: Right;   CAROTID DUPLEX  2014   CATARACT EXTRACTION     both eyes    I have reviewed the social history and family history with the patient and they are unchanged from previous note.  ALLERGIES:  has No Known Allergies.  MEDICATIONS:  Current Outpatient Medications  Medication Sig Dispense Refill   acetaminophen (TYLENOL) 325 MG tablet Take 650 mg by mouth every 6 (six) hours as needed.     anastrozole (ARIMIDEX) 1 MG tablet TAKE 1 TABLET BY MOUTH EVERY DAY 90 tablet 3   Cholecalciferol (VITAMIN D) 2000 UNITS CAPS Take 1 capsule by mouth daily.     Cyanocobalamin (VITAMIN B 12 PO) Take 1 tablet by mouth daily.     DiphenhydrAMINE HCl (BENADRYL PO) Take 1 tablet by mouth as needed.     Fluticasone-Umeclidin-Vilant (TRELEGY ELLIPTA) 100-62.5-25 MCG/INH AEPB Inhale 1 puff into the lungs daily. 1 each 0   furosemide (LASIX) 40 MG tablet Take 1 tablet (40 mg total) by mouth 2  (two) times daily. <PLEASE MAKE APPOINTMENT FOR REFILLS> (Patient taking differently: Take 40 mg by mouth daily. <PLEASE MAKE APPOINTMENT FOR REFILLS>) 180 tablet 2   KLOR-CON M20 20 MEQ tablet TAKE 1 TABLET BY MOUTH DAILY 90 tablet 2   lisinopril (PRINIVIL,ZESTRIL) 40 MG tablet Take 40 mg by mouth daily.     lovastatin (MEVACOR) 40 MG tablet Take 40 mg by mouth at bedtime.     omeprazole (PRILOSEC) 20 MG capsule Take 20 mg by mouth daily.     predniSONE (DELTASONE) 10 MG tablet Take 4 tabs for 2 days, then 3 tabs for 2 days, 2 tabs for 2 days, then 1 tab for 2 days, then stop. 20 tablet 0   PROAIR HFA 108 (90 Base) MCG/ACT inhaler TAKE 2 PUFFS BY MOUTH EVERY 6 HOURS AS NEEDED FOR WHEEZE OR SHORTNESS OF BREATH 8.5 Inhaler 11   TRELEGY ELLIPTA 100-62.5-25 MCG/INH AEPB TAKE 1 PUFF BY  MOUTH EVERY DAY 60 each 5   No current facility-administered medications for this visit.     PHYSICAL EXAMINATION: ECOG PERFORMANCE STATUS: 1 - Symptomatic but completely ambulatory  No vitals taken today, Exam not performed today  LABORATORY DATA:  I have reviewed the data as listed CBC Latest Ref Rng & Units 06/15/2017 11/07/2016 08/04/2016  WBC 3.9 - 10.3 K/uL 6.7 5.4 6.5  Hemoglobin 11.6 - 15.9 g/dL 9.5(L) 9.6(L) 10.3(L)  Hematocrit 34.8 - 46.6 % 30.7(L) 31.0(L) 31.7(L)  Platelets 145 - 400 K/uL 187 205 257.0     CMP Latest Ref Rng & Units 07/17/2017 06/15/2017 11/07/2016  Glucose 70 - 99 mg/dL 106(H) 120 118  BUN 6 - 23 mg/dL 26(H) 31(H) 28.2(H)  Creatinine 0.40 - 1.20 mg/dL 1.39(H) 1.45(H) 1.3(H)  Sodium 135 - 145 mEq/L 144 142 142  Potassium 3.5 - 5.1 mEq/L 4.3 4.6 4.3  Chloride 96 - 112 mEq/L 104 102 -  CO2 19 - 32 mEq/L 32 31(H) 30(H)  Calcium 8.4 - 10.5 mg/dL 9.5 10.1 9.7  Total Protein 6.4 - 8.3 g/dL - 7.4 7.3  Total Bilirubin 0.2 - 1.2 mg/dL - 0.3 0.31  Alkaline Phos 40 - 150 U/L - 78 74  AST 5 - 34 U/L - 14 12  ALT 0 - 55 U/L - 9 <6      RADIOGRAPHIC STUDIES: I have personally  reviewed the radiological images as listed and agreed with the findings in the report. No results found.   ASSESSMENT & PLAN:  Courtney Cline is a 85 y.o. female with    1. Right breast invasive ductal carcinoma, T1 cN0 M0, stage Ia, G2, ER 100% positive, PR 100% positive, HER-2 negative. -She was diagnosed in 10/2013. She is s/p right breast lumpectomy.   -Giving her advanced age and severe comorbidity, and early stage breast cancer, I do not recommend adjuvant chemotherapy. Giving the strong ER/PR positive, low Ki-67, negative HER-2/neu, I think that her recurrence score would be low even we did Oncotype. -Her case was presented in our breast tumor board before and adjuvant RT was not recommended. -She started anastrozole in 11/2013. She is tolerating well overall, mild hot flush. Plan to complete in 11/2018. -She is clinically doing well and stable. There is no clinical concern for recurrence.  -Continue surveillance. Next mammogram in 10/2018 -Continue anastrozole for a total of 5 years  -F/u in 6 months  2. Anemia of chronic disease  -This is likely anemia of chronic disease, secondary to her CKD -Her anemia workup including folic acid, K44, ferritin and iron level were normal. SPEP was negative -If hg was to fall below 9 I informed her that we could aid with this by providing and EPO injection. We will continue to monitor in the interim.  -She has tried oral iron, and recent received iv iron, per her PCP  -Advised the patient to maintain follow up with her PCP.   3. Osteopenia  -Her repeated bone density scan showed osteopenia, high risk for fracture (10 year risk of hip fracture 3.3%) -She will continue vitamin D 2000 units daily. She has difficulty take large pill and is not taking calcium, I previously encouraged her to try chewable calcium pill -We previously discussed the benefit of bisphosphonate and or Prolia, due to her high-risk fracture. She is not interested. -Next  bone density scan in October 2020  4. Severe COPD, on home oxygen 3L/min -She is followed with her pulmonologist Dr. Lake Bells. No recent change  in O2 therapy, stable.   5. CKD -She will continue follow-up with her primary care physician  Plan  -She is clinically well and stable -Continue anastrozole  -Lab and f/u in 6 months  -Mammogram in 10/2018 and DEXA in 11/2018   No problem-specific Assessment & Plan notes found for this encounter.   Orders Placed This Encounter  Procedures   DG Bone Density    Standing Status:   Future    Standing Expiration Date:   06/15/2019    Order Specific Question:   Reason for Exam (SYMPTOM  OR DIAGNOSIS REQUIRED)    Answer:   screening    Order Specific Question:   Preferred imaging location?    Answer:   [facility]   MM DIAG BREAST TOMO BILATERAL    Standing Status:   Future    Standing Expiration Date:   06/15/2019    Order Specific Question:   Reason for Exam (SYMPTOM  OR DIAGNOSIS REQUIRED)    Answer:   screening    Order Specific Question:   Preferred imaging location?    Answer:   [facility]   I discussed the assessment and treatment plan with the patient. The patient was provided an opportunity to ask questions and all were answered. The patient agreed with the plan and demonstrated an understanding of the instructions.  The patient was advised to call back or seek an in-person evaluation if the symptoms worsen or if the condition fails to improve as anticipated.  I provided 10 minutes of non face-to-face telephone visit time during this encounter, and > 50% was spent counseling as documented under my assessment & plan.    Truitt Merle, MD 06/15/2018   I, Joslyn Devon, am acting as scribe for Truitt Merle, MD.   I have reviewed the above documentation for accuracy and completeness, and I agree with the above.

## 2018-06-15 ENCOUNTER — Other Ambulatory Visit: Payer: PPO

## 2018-06-15 ENCOUNTER — Inpatient Hospital Stay: Payer: PPO | Attending: Hematology | Admitting: Hematology

## 2018-06-15 ENCOUNTER — Encounter: Payer: Self-pay | Admitting: Hematology

## 2018-06-15 DIAGNOSIS — Z170 Estrogen receptor positive status [ER+]: Secondary | ICD-10-CM | POA: Diagnosis not present

## 2018-06-15 DIAGNOSIS — D6489 Other specified anemias: Secondary | ICD-10-CM

## 2018-06-15 DIAGNOSIS — C50211 Malignant neoplasm of upper-inner quadrant of right female breast: Secondary | ICD-10-CM | POA: Diagnosis not present

## 2018-06-15 DIAGNOSIS — E2839 Other primary ovarian failure: Secondary | ICD-10-CM | POA: Diagnosis not present

## 2018-06-15 DIAGNOSIS — N189 Chronic kidney disease, unspecified: Secondary | ICD-10-CM

## 2018-06-15 DIAGNOSIS — J449 Chronic obstructive pulmonary disease, unspecified: Secondary | ICD-10-CM

## 2018-06-15 DIAGNOSIS — Z79811 Long term (current) use of aromatase inhibitors: Secondary | ICD-10-CM | POA: Diagnosis not present

## 2018-06-15 DIAGNOSIS — Z79899 Other long term (current) drug therapy: Secondary | ICD-10-CM | POA: Diagnosis not present

## 2018-06-15 DIAGNOSIS — M8580 Other specified disorders of bone density and structure, unspecified site: Secondary | ICD-10-CM

## 2018-06-18 ENCOUNTER — Telehealth: Payer: Self-pay | Admitting: Hematology

## 2018-06-18 NOTE — Telephone Encounter (Signed)
Scheduled appt per 5/1 los ° °A calendar will be mailed out. °

## 2018-07-05 DIAGNOSIS — D509 Iron deficiency anemia, unspecified: Secondary | ICD-10-CM | POA: Diagnosis not present

## 2018-07-25 DIAGNOSIS — Z853 Personal history of malignant neoplasm of breast: Secondary | ICD-10-CM | POA: Diagnosis not present

## 2018-07-25 DIAGNOSIS — E785 Hyperlipidemia, unspecified: Secondary | ICD-10-CM | POA: Diagnosis not present

## 2018-07-25 DIAGNOSIS — N183 Chronic kidney disease, stage 3 (moderate): Secondary | ICD-10-CM | POA: Diagnosis not present

## 2018-07-25 DIAGNOSIS — J449 Chronic obstructive pulmonary disease, unspecified: Secondary | ICD-10-CM | POA: Diagnosis not present

## 2018-07-25 DIAGNOSIS — D638 Anemia in other chronic diseases classified elsewhere: Secondary | ICD-10-CM | POA: Diagnosis not present

## 2018-07-25 DIAGNOSIS — D509 Iron deficiency anemia, unspecified: Secondary | ICD-10-CM | POA: Diagnosis not present

## 2018-07-25 DIAGNOSIS — D0511 Intraductal carcinoma in situ of right breast: Secondary | ICD-10-CM | POA: Diagnosis not present

## 2018-07-25 DIAGNOSIS — I10 Essential (primary) hypertension: Secondary | ICD-10-CM | POA: Diagnosis not present

## 2018-08-20 DIAGNOSIS — E785 Hyperlipidemia, unspecified: Secondary | ICD-10-CM | POA: Diagnosis not present

## 2018-08-20 DIAGNOSIS — N183 Chronic kidney disease, stage 3 (moderate): Secondary | ICD-10-CM | POA: Diagnosis not present

## 2018-08-20 DIAGNOSIS — J449 Chronic obstructive pulmonary disease, unspecified: Secondary | ICD-10-CM | POA: Diagnosis not present

## 2018-08-20 DIAGNOSIS — D509 Iron deficiency anemia, unspecified: Secondary | ICD-10-CM | POA: Diagnosis not present

## 2018-08-20 DIAGNOSIS — Z853 Personal history of malignant neoplasm of breast: Secondary | ICD-10-CM | POA: Diagnosis not present

## 2018-08-20 DIAGNOSIS — I10 Essential (primary) hypertension: Secondary | ICD-10-CM | POA: Diagnosis not present

## 2018-08-20 DIAGNOSIS — D0511 Intraductal carcinoma in situ of right breast: Secondary | ICD-10-CM | POA: Diagnosis not present

## 2018-08-20 DIAGNOSIS — D638 Anemia in other chronic diseases classified elsewhere: Secondary | ICD-10-CM | POA: Diagnosis not present

## 2018-09-14 ENCOUNTER — Other Ambulatory Visit: Payer: Self-pay | Admitting: Pulmonary Disease

## 2018-09-18 DIAGNOSIS — E785 Hyperlipidemia, unspecified: Secondary | ICD-10-CM | POA: Diagnosis not present

## 2018-09-18 DIAGNOSIS — J449 Chronic obstructive pulmonary disease, unspecified: Secondary | ICD-10-CM | POA: Diagnosis not present

## 2018-09-18 DIAGNOSIS — I10 Essential (primary) hypertension: Secondary | ICD-10-CM | POA: Diagnosis not present

## 2018-09-18 DIAGNOSIS — D0511 Intraductal carcinoma in situ of right breast: Secondary | ICD-10-CM | POA: Diagnosis not present

## 2018-09-18 DIAGNOSIS — D509 Iron deficiency anemia, unspecified: Secondary | ICD-10-CM | POA: Diagnosis not present

## 2018-09-18 DIAGNOSIS — D638 Anemia in other chronic diseases classified elsewhere: Secondary | ICD-10-CM | POA: Diagnosis not present

## 2018-09-18 DIAGNOSIS — N183 Chronic kidney disease, stage 3 (moderate): Secondary | ICD-10-CM | POA: Diagnosis not present

## 2018-09-18 DIAGNOSIS — Z853 Personal history of malignant neoplasm of breast: Secondary | ICD-10-CM | POA: Diagnosis not present

## 2018-09-19 ENCOUNTER — Telehealth: Payer: Self-pay | Admitting: Pulmonary Disease

## 2018-09-19 NOTE — Telephone Encounter (Signed)
Called and spoke with pt who stated she went to pick up Trelegy and the cost went up to around $125 due to her being in the donut hole. Pt asked if she could have any samples. I stated to pt that she was overdue for an OV that if she scheduled an OV we could then give her samples and at this time pt said that she did not want to schedule an OV. Nothing further needed.

## 2018-10-02 DIAGNOSIS — D509 Iron deficiency anemia, unspecified: Secondary | ICD-10-CM | POA: Diagnosis not present

## 2018-10-05 ENCOUNTER — Telehealth: Payer: Self-pay | Admitting: Pulmonary Disease

## 2018-10-05 NOTE — Telephone Encounter (Signed)
Libby and myself looked over this we do not see any qualifing stats or current orders not sure what to send

## 2018-10-05 NOTE — Telephone Encounter (Signed)
Will route this to [facility] pool for follow up

## 2018-10-05 NOTE — Telephone Encounter (Signed)
I don't have anything on this patient and if is in [employer] I haven't been able to get in the system to sign off on anything this week. Somehow or another they have lost my connection

## 2018-10-05 NOTE — Telephone Encounter (Signed)
Courtney Cline did you get the renewal for oxygen form? I called Courtney Cline and she states she needs this form signed and faxed back so insurance can requalify her for oxygen. I did advise that pt may need an OV since she hasnt been seen since December.

## 2018-10-08 NOTE — Telephone Encounter (Signed)
There has not been a fax received at this time.  [place], [place], [phone] X [phone], call was picked up by [place] office.  That call was transferred to [place] office. No one knew Courtney Cline at this number of offices transferred to. Called [employer] local number, spoke with Corene Cornea.  Corene Cornea stated they had no notes in system about Patient needing re certification.  Corene Cornea transferred call to Rosie Fate VM.  LMTCB when available to see if fax was sent to correct number, and Patient has not been seen since 01/2018.

## 2018-10-09 NOTE — Telephone Encounter (Signed)
Will forward to [facility] as pt has an OV with [facility] on 8/31. Pt can be re-qualified for her O2 at that visit and then can be sent to DME. Pt last seen 01/2018.

## 2018-10-09 NOTE — Telephone Encounter (Signed)
Yes I will make a note of it. Thanks

## 2018-10-15 ENCOUNTER — Encounter: Payer: Self-pay | Admitting: Acute Care

## 2018-10-15 ENCOUNTER — Other Ambulatory Visit: Payer: Self-pay

## 2018-10-15 ENCOUNTER — Ambulatory Visit: Payer: PPO | Admitting: Acute Care

## 2018-10-15 DIAGNOSIS — I10 Essential (primary) hypertension: Secondary | ICD-10-CM

## 2018-10-15 DIAGNOSIS — Z0000 Encounter for general adult medical examination without abnormal findings: Secondary | ICD-10-CM | POA: Insufficient documentation

## 2018-10-15 DIAGNOSIS — J9611 Chronic respiratory failure with hypoxia: Secondary | ICD-10-CM

## 2018-10-15 DIAGNOSIS — Z23 Encounter for immunization: Secondary | ICD-10-CM | POA: Diagnosis not present

## 2018-10-15 DIAGNOSIS — J449 Chronic obstructive pulmonary disease, unspecified: Secondary | ICD-10-CM | POA: Insufficient documentation

## 2018-10-15 DIAGNOSIS — R5381 Other malaise: Secondary | ICD-10-CM | POA: Insufficient documentation

## 2018-10-15 MED ORDER — TRELEGY ELLIPTA 100-62.5-25 MCG/INH IN AEPB: 1.0000 | INHALATION_SPRAY | Freq: Every day | RESPIRATORY_TRACT | 0 refills | Status: DC

## 2018-10-15 NOTE — Assessment & Plan Note (Signed)
Better controlled in the office today Plan ]Continued follow up with PCP

## 2018-10-15 NOTE — Assessment & Plan Note (Signed)
We will qualify you for your oxygen today. Continue oxygen at 3 L with rest and 4 L with activity Follow up in 6 months with [name], or Tracer Gutridge NP Please contact office for sooner follow up if symptoms do not improve or worsen or seek emergency care

## 2018-10-15 NOTE — Patient Instructions (Addendum)
It is good to see you today. Flu vaccine today We will qualify you for your oxygen today. We will give you some Trelegy samples today. We will place a referral to the Pharmacy Team to see if they can help with the price of your  Trelegy Continue using Trelegy 1 puff once daily every day without fail Rinse mouth after use Continue using ProAir as  rescue for break through shortness of breath or wheezing up to 3 times daily. Continue Zyrtec daily for runny nose Resume Flonase ( Fluticasone) 2 sprays each nostril  once daily. Continue oxygen at 3 L with rest and 4 L with activity Try to walk at home to work on conditioning Continue wearing face masks and washing your hands frequently as you have been doing to stay safe during the pandemic. We will reassign you to another physician in the practice, as it does not look like Dr. Lake Bells will be returning to the outpatient office for awhile. Note your daily symptoms > remember "red flags" for COPD:  Increase in cough, increase in sputum production, increase in shortness of breath or activity intolerance. If you notice these symptoms, please call to be seen.   Follow up in 6 months with [name], or Judson Roch NP Please contact office for sooner follow up if symptoms do not improve or worsen or seek emergency care

## 2018-10-15 NOTE — Assessment & Plan Note (Signed)
Less active since  COVID  Has noticed that she has less physical reserve Plan Try to walk at home and slowly increase your activity daily Wear your oxygen at rest and with activity

## 2018-10-15 NOTE — Progress Notes (Signed)
History of Present Illness Courtney Cline is a 85 y.o. female former smoker( Quit 1996 with a 40 pack year smoking history with GOLD Grade D COPD, on home oxygen.) She has been previously followed by Dr. Lake Cline.  Maintenance Medication: Trelegy ProAir Oxygen at 3 L [place] at rest, and 4 L [place] with activity>> Qualification 10/15/2018   10/15/2018 Qualification for oxygen therapy Pt. Presents for follow up. She was last seen 01/2018 for slow to resolve COPD flare. She was treated with antibiotic and pred taper at that time. We started her on Zyrtec and Flonase for PND that was contributing to cough.She was scheduled for follow up 3 weeks after that visit, which she did not present for. She presents today for re qualification for her oxygen. Per her DME she needs face to face to re-qualify. She states she has been doing well.  She has been compliant with her Trelegy. She uses her rescue inhaler every day 4 times a day. She states she has breakthrough shortness of breath or wheezing. She states her Trelegy is costing a lot. We will place an order for pharmacy to review. She has not had a flare in the last year since starting Trelegy. She denies any fever, chest pain, orthopnea or hemoptysis. She has noticed that he has become a bit deconditioned with lack of activity due to COVID =limitations.  Of note she was hypertensive at her last OV. She was asked to follow up with her PCP. She is normotensive today. She states she did follow up with her PCP.  Test Results:  CXR 02/12/2018 Chronic bronchitic changes, stable. No alveolar pneumonia nor pulmonary edema.  [place]: 2016 FEV1 31% pred on 3 L continuously>> Very severe obstruction, with low vital capacity. 2015 FEV1 33% pred, on 2L O2 continuously  Chest imaging: 05/2016 CXR images independently reviewed: Hyperinflation consistent with COPD, no pulmonary parenchymal abnormality 06/30/17 CXRUnderlying COPD, stable. Mild scarring in the bases. No  edema or consolidation. Stable cardiac silhouette. There is aortic atherosclerosis.   CBC Latest Ref Rng & Units 06/15/2017 11/07/2016 08/04/2016  WBC 3.9 - 10.3 K/uL 6.7 5.4 6.5  Hemoglobin 11.6 - 15.9 g/dL 9.5(L) 9.6(L) 10.3(L)  Hematocrit 34.8 - 46.6 % 30.7(L) 31.0(L) 31.7(L)  Platelets 145 - 400 K/uL 187 205 257.0    BMP Latest Ref Rng & Units 07/17/2017 06/15/2017 11/07/2016  Glucose 70 - 99 mg/dL 106(H) 120 118  BUN 6 - 23 mg/dL 26(H) 31(H) 28.2(H)  Creatinine 0.40 - 1.20 mg/dL 1.39(H) 1.45(H) 1.3(H)  Sodium 135 - 145 mEq/L 144 142 142  Potassium 3.5 - 5.1 mEq/L 4.3 4.6 4.3  Chloride 96 - 112 mEq/L 104 102 -  CO2 19 - 32 mEq/L 32 31(H) 30(H)  Calcium 8.4 - 10.5 mg/dL 9.5 10.1 9.7    BNP No results found for: BNP  ProBNP    Component Value Date/Time   PROBNP 29.0 07/17/2017 1501    PFT No results found for: FEV1PRE, FEV1POST, FVCPRE, FVCPOST, TLC, DLCOUNC, PREFEV1FVCRT, PSTFEV1FVCRT  No results found.   Past medical hx Past Medical History:  Diagnosis Date  . Allergic rhinitis   . Arthritis   . Breast calcification seen on mammogram    Right breast  . Breast cancer ([place])   . COPD (chronic obstructive pulmonary disease) ([place])   . GERD (gastroesophageal reflux disease)   . Hyperglycemia   . Hyperlipidemia   . Hypertension   . Low back pain   . On home oxygen therapy  all the time  . Osteopenia   . Peripheral edema   . Shortness of breath   . Subclavian artery stenosis, left ([place])   . Vitamin D deficiency   . Wears dentures    top  . Wears glasses    reading     Social History   Tobacco Use  . Smoking status: Former Smoker    Packs/day: 1.00    Years: 40.00    Pack years: 40.00    Types: Cigarettes    Quit date: 02/14/1994    Years since quitting: 24.6  . Smokeless tobacco: Never Used  Substance Use Topics  . Alcohol use: No  . Drug use: No    Courtney Cline reports that she quit smoking about 24 years ago. Her smoking use included cigarettes. She  has a 40.00 pack-year smoking history. She has never used smokeless tobacco. She reports that she does not drink alcohol or use drugs.  Tobacco Cessation: Former smoker , quit 1996 with a 40 pack year smoking history.  Past surgical hx, Family hx, Social hx all reviewed.  Current Outpatient Medications on File Prior to Visit  Medication Sig  . acetaminophen (TYLENOL) 325 MG tablet Take 650 mg by mouth every 6 (six) hours as needed.  Marland Kitchen anastrozole (ARIMIDEX) 1 MG tablet TAKE 1 TABLET BY MOUTH EVERY DAY  . Cholecalciferol (VITAMIN D) 2000 UNITS CAPS Take 1 capsule by mouth daily.  . Cyanocobalamin (VITAMIN B 12 PO) Take 1 tablet by mouth daily.  . DiphenhydrAMINE HCl (BENADRYL PO) Take 1 tablet by mouth as needed.  . Fluticasone-Umeclidin-Vilant (TRELEGY ELLIPTA) 100-62.5-25 MCG/INH AEPB Inhale 1 puff into the lungs daily.  . furosemide (LASIX) 40 MG tablet Take 1 tablet (40 mg total) by mouth 2 (two) times daily. <PLEASE MAKE APPOINTMENT FOR REFILLS> (Patient taking differently: Take 40 mg by mouth daily. <PLEASE MAKE APPOINTMENT FOR REFILLS>)  . KLOR-CON M20 20 MEQ tablet TAKE 1 TABLET BY MOUTH DAILY  . lisinopril (PRINIVIL,ZESTRIL) 40 MG tablet Take 40 mg by mouth daily.  Marland Kitchen lovastatin (MEVACOR) 40 MG tablet Take 40 mg by mouth at bedtime.  Marland Kitchen omeprazole (PRILOSEC) 20 MG capsule Take 20 mg by mouth daily.  Marland Kitchen PROAIR HFA 108 (90 Base) MCG/ACT inhaler TAKE 2 PUFFS BY MOUTH EVERY 6 HOURS AS NEEDED FOR WHEEZE OR SHORTNESS OF BREATH  . TRELEGY ELLIPTA 100-62.5-25 MCG/INH AEPB INHALE 1 PUFF BY MOUTH EVERY DAY   No current facility-administered medications on file prior to visit.      No Known Allergies  Review Of Systems:  Constitutional:   No  weight loss, night sweats,  Fevers, chills, fatigue, or  lassitude.  HEENT:   No headaches,  Difficulty swallowing,  Tooth/dental problems, or  Sore throat,                No sneezing, itching, ear ache, nasal congestion, post nasal drip,   CV:  No  chest pain,  Orthopnea, PND, swelling in lower extremities, anasarca, dizziness, palpitations, syncope.   GI  No heartburn, indigestion, abdominal pain, nausea, vomiting, diarrhea, change in bowel habits, loss of appetite, bloody stools.   Resp: + shortness of breath with exertion or at rest.  No excess mucus, no productive cough,  No non-productive cough,  No coughing up of blood.  No change in color of mucus.  No wheezing.  No chest wall deformity  Skin: no rash or lesions.  GU: no dysuria, change in color of urine, no urgency or frequency.  No flank pain, no hematuria   MS:  No joint pain or swelling.  No decreased range of motion.  No back pain.  Psych:  No change in mood or affect. No depression or anxiety.  No memory loss.   Vital Signs BP (!) 118/52 (BP Location: Right Arm, Cuff Size: Normal)   Pulse 95   Temp 97.9 F (36.6 C) (Oral)   Ht 4\' 10"  (1.473 m)   Wt 185 lb (83.9 kg)   SpO2 90%   BMI 38.67 kg/m    Physical Exam:  General- No distress,  A&Ox3, pleasant ENT: No sinus tenderness, TM clear, pale nasal mucosa, no oral exudate,no post nasal drip, no LAN Cardiac: S1, S2, regular rate and rhythm, no murmur Chest: No wheeze/ rales/ dullness; no accessory muscle use, no nasal flaring, no sternal retractions Abd.: Soft Non-tender, ND, BS +, Body mass index is 38.67 kg/m. Ext: No clubbing cyanosis, trace BLE edema, brisk refill noted Neuro:  Deconditioned at baseline , MAE x 4, A&O x 3, pleasant and appropriate Skin: No rashes, No lesions, warm and dry Psych: normal mood and behavior   Assessment/Plan  Chronic hypoxemic respiratory failure (HCC) We will qualify you for your oxygen today. Continue oxygen at 3 L with rest and 4 L with activity Follow up in 6 months with [name], or Judson Roch NP Please contact office for sooner follow up if symptoms do not improve or worsen or seek emergency care  COPD without exacerbation ([place]) Stable interval No flares since  01/2018 Some seasonal allergic rhinitis  Plan We will qualify you for your oxygen today. We will give you some Trelegy samples today. We will place a referral to the Pharmacy Team to see if they can help with the price of your  Trelegy Continue using Trelegy 1 puff once daily every day without fail Rinse mouth after use Continue using ProAir as  rescue for break through shortness of breath or wheezing up to 3 times daily. Continue Zyrtec daily for runny nose Resume Flonase ( Fluticasone) 2 sprays each nostril  once daily. Continue oxygen at 3 L with rest and 4 L with activity Try to walk at home to work on conditioning We will reassign you to another physician in the practice, as it does not look like Dr. Lake Cline will be returning to the outpatient office for awhile. Note your daily symptoms > remember "red flags" for COPD:  Increase in cough, increase in sputum production, increase in shortness of breath or activity intolerance. If you notice these symptoms, please call to be seen.   Follow up in 6 months with [name], or Judson Roch NP Please contact office for sooner follow up if symptoms do not improve or worsen or seek emergency care   Essential hypertension Better controlled in the office today Plan ]Continued follow up with PCP  Healthcare maintenance Flu vaccine today  Physical deconditioning Less active since  COVID  Has noticed that she has less physical reserve Plan Try to walk at home and slowly increase your activity daily Wear your oxygen at rest and with activity    Magdalen Spatz, NP 10/15/2018  10:54 AM

## 2018-10-15 NOTE — Assessment & Plan Note (Signed)
Flu vaccine today 

## 2018-10-15 NOTE — Assessment & Plan Note (Signed)
Stable interval No flares since 01/2018 Some seasonal allergic rhinitis  Plan We will qualify you for your oxygen today. We will give you some Trelegy samples today. We will place a referral to the Pharmacy Team to see if they can help with the price of your  Trelegy Continue using Trelegy 1 puff once daily every day without fail Rinse mouth after use Continue using ProAir as  rescue for break through shortness of breath or wheezing up to 3 times daily. Continue Zyrtec daily for runny nose Resume Flonase ( Fluticasone) 2 sprays each nostril  once daily. Continue oxygen at 3 L with rest and 4 L with activity Try to walk at home to work on conditioning We will reassign you to another physician in the practice, as it does not look like Dr. Lake Bells will be returning to the outpatient office for awhile. Note your daily symptoms > remember "red flags" for COPD:  Increase in cough, increase in sputum production, increase in shortness of breath or activity intolerance. If you notice these symptoms, please call to be seen.   Follow up in 6 months with [name], or Judson Roch NP Please contact office for sooner follow up if symptoms do not improve or worsen or seek emergency care

## 2018-10-16 NOTE — Telephone Encounter (Signed)
The qualifying walk was done and order was sent to her DME.

## 2018-10-25 DIAGNOSIS — J9610 Chronic respiratory failure, unspecified whether with hypoxia or hypercapnia: Secondary | ICD-10-CM | POA: Diagnosis not present

## 2018-10-25 DIAGNOSIS — R0902 Hypoxemia: Secondary | ICD-10-CM | POA: Diagnosis not present

## 2018-10-25 DIAGNOSIS — J449 Chronic obstructive pulmonary disease, unspecified: Secondary | ICD-10-CM | POA: Diagnosis not present

## 2018-10-30 DIAGNOSIS — J449 Chronic obstructive pulmonary disease, unspecified: Secondary | ICD-10-CM | POA: Diagnosis not present

## 2018-10-30 DIAGNOSIS — R200 Anesthesia of skin: Secondary | ICD-10-CM | POA: Diagnosis not present

## 2018-10-30 DIAGNOSIS — Z9981 Dependence on supplemental oxygen: Secondary | ICD-10-CM | POA: Diagnosis not present

## 2018-10-30 DIAGNOSIS — B351 Tinea unguium: Secondary | ICD-10-CM | POA: Diagnosis not present

## 2018-10-30 DIAGNOSIS — G629 Polyneuropathy, unspecified: Secondary | ICD-10-CM | POA: Diagnosis not present

## 2018-11-02 DIAGNOSIS — J449 Chronic obstructive pulmonary disease, unspecified: Secondary | ICD-10-CM | POA: Diagnosis not present

## 2018-11-02 DIAGNOSIS — N183 Chronic kidney disease, stage 3 (moderate): Secondary | ICD-10-CM | POA: Diagnosis not present

## 2018-11-02 DIAGNOSIS — D638 Anemia in other chronic diseases classified elsewhere: Secondary | ICD-10-CM | POA: Diagnosis not present

## 2018-11-02 DIAGNOSIS — D0511 Intraductal carcinoma in situ of right breast: Secondary | ICD-10-CM | POA: Diagnosis not present

## 2018-11-02 DIAGNOSIS — Z853 Personal history of malignant neoplasm of breast: Secondary | ICD-10-CM | POA: Diagnosis not present

## 2018-11-02 DIAGNOSIS — E785 Hyperlipidemia, unspecified: Secondary | ICD-10-CM | POA: Diagnosis not present

## 2018-11-02 DIAGNOSIS — D509 Iron deficiency anemia, unspecified: Secondary | ICD-10-CM | POA: Diagnosis not present

## 2018-11-02 DIAGNOSIS — I10 Essential (primary) hypertension: Secondary | ICD-10-CM | POA: Diagnosis not present

## 2018-11-06 ENCOUNTER — Encounter: Payer: Self-pay | Admitting: Podiatry

## 2018-11-06 ENCOUNTER — Other Ambulatory Visit: Payer: Self-pay

## 2018-11-06 ENCOUNTER — Ambulatory Visit (INDEPENDENT_AMBULATORY_CARE_PROVIDER_SITE_OTHER): Payer: PPO | Admitting: Podiatry

## 2018-11-06 VITALS — BP 133/82 | HR 89 | Resp 16

## 2018-11-06 DIAGNOSIS — M79676 Pain in unspecified toe(s): Secondary | ICD-10-CM | POA: Diagnosis not present

## 2018-11-06 DIAGNOSIS — M7662 Achilles tendinitis, left leg: Secondary | ICD-10-CM

## 2018-11-06 DIAGNOSIS — B351 Tinea unguium: Secondary | ICD-10-CM | POA: Diagnosis not present

## 2018-11-06 NOTE — Progress Notes (Signed)
Subjective:  Patient ID: Courtney Cline, female    DOB: Jan 27, 1934,  MRN: [id number] HPI Chief Complaint  Patient presents with  . Debridement    Requesting nail trim - toenails long, thick and yellow, unable to cut herself  . New Patient (Initial Visit)    85 y.o. female presents with the above complaint.   ROS: Denies fever chills nausea vomiting muscle aches pains calf pain back pain chest pain shortness of breath.  Past Medical History:  Diagnosis Date  . Allergic rhinitis   . Arthritis   . Breast calcification seen on mammogram    Right breast  . Breast cancer ([place])   . COPD (chronic obstructive pulmonary disease) ([place])   . GERD (gastroesophageal reflux disease)   . Hyperglycemia   . Hyperlipidemia   . Hypertension   . Low back pain   . On home oxygen therapy    all the time  . Osteopenia   . Peripheral edema   . Shortness of breath   . Subclavian artery stenosis, left ([place])   . Vitamin D deficiency   . Wears dentures    top  . Wears glasses    reading   Past Surgical History:  Procedure Laterality Date  . BREAST BIOPSY    . BREAST LUMPECTOMY     right 2015  . BREAST LUMPECTOMY WITH RADIOACTIVE SEED LOCALIZATION Right 12/20/2013   Procedure: RIGHT BREAST LUMPECTOMY WITH RADIOACTIVE SEED LOCALIZATION;  Surgeon: Erroll Luna, MD;  Location: [facility];  Service: General;  Laterality: Right;  . CAROTID DUPLEX  2014  . CATARACT EXTRACTION     both eyes    Current Outpatient Medications:  .  acetaminophen (TYLENOL) 325 MG tablet, Take 650 mg by mouth every 6 (six) hours as needed., Disp: , Rfl:  .  anastrozole (ARIMIDEX) 1 MG tablet, TAKE 1 TABLET BY MOUTH EVERY DAY, Disp: 90 tablet, Rfl: 3 .  Cholecalciferol (VITAMIN D) 2000 UNITS CAPS, Take 1 capsule by mouth daily., Disp: , Rfl:  .  Cyanocobalamin (VITAMIN B 12 PO), Take 1 tablet by mouth daily., Disp: , Rfl:  .  DiphenhydrAMINE HCl (BENADRYL PO), Take 1 tablet by mouth as needed., Disp:  , Rfl:  .  famotidine (PEPCID) 20 MG tablet, 1 TABLET AT BEDTIME AS NEEDED EVERY OTHER DAY, WITH BREAKFAST ORALLY 90 DAYS, Disp: , Rfl:  .  Fluticasone-Umeclidin-Vilant (TRELEGY ELLIPTA) 100-62.5-25 MCG/INH AEPB, Inhale 1 puff into the lungs daily., Disp: 1 each, Rfl: 0 .  Fluticasone-Umeclidin-Vilant (TRELEGY ELLIPTA) 100-62.5-25 MCG/INH AEPB, Take 1 puff by mouth daily at 2 am., Disp: 2 each, Rfl: 0 .  furosemide (LASIX) 40 MG tablet, Take 1 tablet (40 mg total) by mouth 2 (two) times daily. <PLEASE MAKE APPOINTMENT FOR REFILLS> (Patient taking differently: Take 40 mg by mouth daily. <PLEASE MAKE APPOINTMENT FOR REFILLS>), Disp: 180 tablet, Rfl: 2 .  KLOR-CON M20 20 MEQ tablet, TAKE 1 TABLET BY MOUTH DAILY, Disp: 90 tablet, Rfl: 2 .  lisinopril (PRINIVIL,ZESTRIL) 40 MG tablet, Take 40 mg by mouth daily., Disp: , Rfl:  .  lovastatin (MEVACOR) 40 MG tablet, Take 40 mg by mouth at bedtime., Disp: , Rfl:  .  omeprazole (PRILOSEC) 20 MG capsule, Take 20 mg by mouth daily., Disp: , Rfl:  .  PROAIR HFA 108 (90 Base) MCG/ACT inhaler, TAKE 2 PUFFS BY MOUTH EVERY 6 HOURS AS NEEDED FOR WHEEZE OR SHORTNESS OF BREATH, Disp: 8.5 Inhaler, Rfl: 11  No Known Allergies Review of Systems  Objective:   Vitals:   11/06/18 1417  BP: 133/82  Pulse: 89  Resp: 16    General: Well developed, nourished, in no acute distress, alert and oriented x3   Dermatological: Skin is warm, dry and supple bilateral. Nails x 10 are well maintained; remaining integument appears unremarkable at this time. There are no open sores, no preulcerative lesions, no rash or signs of infection present.  Nails are thick yellow dystrophic-like mycotic and painful on debridement.  Vascular: Dorsalis Pedis artery and Posterior Tibial artery pedal pulses are 2/4 bilateral with immedate capillary fill time. Pedal hair growth present. No varicosities and no lower extremity edema present bilateral.   Neruologic: Grossly intact via light touch  bilateral. Vibratory intact via tuning fork bilateral. Protective threshold with Semmes Wienstein monofilament intact to all pedal sites bilateral. Patellar and Achilles deep tendon reflexes 2+ bilateral. No Babinski or clonus noted bilateral.   Musculoskeletal: No gross boney pedal deformities bilateral. No pain, crepitus, or limitation noted with foot and ankle range of motion bilateral. Muscular strength 5/5 in all groups tested bilateral.  She has pain on palpation medial calcaneal tubercle of the left heel.  Gait: Unassisted, Nonantalgic.    Radiographs:  None taken  Assessment & Plan:   Assessment: Plantar fasciitis left.  Pain limb secondary to onychomycosis.  Plan: After sterile Betadine skin prep I injected 20 mg Kenalog 5 mg Marcaine for maximal tenderness of the left heel.  Debrided toenails 1 through 5 bilateral she be referred to routine care.      T. Malcolm, [place]

## 2018-11-08 ENCOUNTER — Other Ambulatory Visit: Payer: Self-pay

## 2018-11-08 ENCOUNTER — Ambulatory Visit
Admission: RE | Admit: 2018-11-08 | Discharge: 2018-11-08 | Disposition: A | Discharge status: Home / Self Care | Payer: PPO | Source: Ambulatory Visit | Attending: Hematology | Admitting: Hematology

## 2018-11-08 DIAGNOSIS — Z170 Estrogen receptor positive status [ER+]: Secondary | ICD-10-CM

## 2018-11-08 DIAGNOSIS — R928 Other abnormal and inconclusive findings on diagnostic imaging of breast: Secondary | ICD-10-CM | POA: Diagnosis not present

## 2018-11-08 DIAGNOSIS — C50211 Malignant neoplasm of upper-inner quadrant of right female breast: Secondary | ICD-10-CM

## 2018-11-23 ENCOUNTER — Ambulatory Visit: Payer: PPO

## 2018-12-03 DIAGNOSIS — R0902 Hypoxemia: Secondary | ICD-10-CM | POA: Diagnosis not present

## 2018-12-03 DIAGNOSIS — J9610 Chronic respiratory failure, unspecified whether with hypoxia or hypercapnia: Secondary | ICD-10-CM | POA: Diagnosis not present

## 2018-12-03 DIAGNOSIS — J449 Chronic obstructive pulmonary disease, unspecified: Secondary | ICD-10-CM | POA: Diagnosis not present

## 2018-12-17 ENCOUNTER — Ambulatory Visit: Payer: PPO | Admitting: Hematology

## 2018-12-17 ENCOUNTER — Other Ambulatory Visit: Payer: PPO

## 2018-12-17 ENCOUNTER — Telehealth: Payer: Self-pay | Admitting: Hematology

## 2018-12-17 NOTE — Telephone Encounter (Signed)
Returned patient's phone call regarding rescheduling 11/02 appointments, per patient's request appointment has moved to 11/09.

## 2018-12-23 NOTE — Progress Notes (Signed)
[name]   [phone]) [phone] [phone]   Clinic Follow up Note   Patient Care Team: Leeroy Cha, MD as PCP - General (Internal Medicine) Juanito Doom, MD as Consulting Physician (Pulmonary Disease) Truitt Merle, MD as Consulting Physician (Hematology) Erroll Luna, MD as Consulting Physician (General Surgery) 12/24/2018  CHIEF COMPLAINT: F/u right breast cancer   SUMMARY OF ONCOLOGIC HISTORY: Oncology History Overview Note  Breast cancer of upper-inner quadrant of right female breast   Staging form: Breast, AJCC 7th Edition     Clinical: Stage Unknown (T1c, NX, cM0) - Signed by Glean Salvo, MD on 11/19/2013       Staging comments: ER 100%+, PR 100%+, Ki-67 7%      Breast cancer of upper-inner quadrant of right female breast ([place])  12/06/2012 Imaging   Bone Density performed at [facility] physicians T score Lumbar spine -0.1 (-0.7 in 2012), Right neck femur -1.4 (-1.2 before), Left neck femur -1.4 (-1.5 before) and Interpretation is OSTEOPENIA by WHO criteria.    10/11/2013 Mammogram   Abnormal Screening mammogram showing Right Breast mass.   10/23/2013 Breast US   Diagnostic mammogram and US showed an irregular hypoechoic mass at 9'0 clock position right breast 8 cm from nipple. [place] measurement 1.2x0.7x1.1 cm, no axillary adenopathy.   10/23/2013 Initial Biopsy   US guided biopsy  done with clip placement.   10/23/2013 Pathology Results   Invasive ductal carcinoma (IDC), DCIS, invasive cancer grade 2, ER 100%+, PR 100%+, Ki-67% 7%, HER-2/NEU by CISH No amplification, ratio of her2:cep17 1.00, average her2 copy number per cell 1.95 (HER 2 Negative tumor). Molecular Classification LUMINAL A.   11/01/2013 Surgery   Initial surgical evaluation by Dr Marcello Moores Cornett: "Not good surgical candidate because of pulmonary status". Recommended anti-estrogen therapy.   11/18/2013 -  Anti-estrogen oral therapy   Anastrozole 1 mg once daily    12/20/2013 Surgery   right breast lumpectomy without SLN biopsy, negative margins    12/20/2013 Pathologic Stage   pT1cNxMx, G2, LVI (-), tumor measures 1.2cm. Surgical margins are negative.     10/18/2016 Imaging   MM DIAG BREAST TOMO BILATERAL 10/18/16 IMPRESSION: No mammographic evidence of malignancy involving either breast. Expected post lumpectomy changes in the right breast.   11/08/2018 Mammogram   IMPRESSION: No evidence of malignancy in either breast. Lumpectomy changes on the right. RECOMMENDATION: Diagnostic mammogram is suggested in 1 year. (Code:[id number])     CURRENT THERAPY: Anastrozole '1mg'$  daily, started on 11/18/2013. Plan to complete in 11/2018  INTERVAL HISTORY: Courtney Cline returns for f/u as scheduled. She had a virtual visit with Dr. Burr Medico in 06/2018. She had a negative mammogram in 10/2018. She is doing well, denies changes in her health in the interim. She continues anastrozole. Hot flashes are stable, tolerable. Denies bone/joint pain or new pain. Denies concerns in her breasts except redness under the right breast. Breathing is stable, on 3L, DOE also stable. No cough or chest pain. Appetite is normal, no weight loss. Denies changes in bowel habits, GI or vaginal bleeding, signs of thrombosis, or change in her mood.    MEDICAL HISTORY:  Past Medical History:  Diagnosis Date  . Allergic rhinitis   . Arthritis   . Breast calcification seen on mammogram    Right breast  . Breast cancer ([place])   . COPD (chronic obstructive pulmonary disease) ([place])   . GERD (gastroesophageal reflux disease)   . Hyperglycemia   . Hyperlipidemia   . Hypertension   .  Low back pain   . On home oxygen therapy    all the time  . Osteopenia   . Peripheral edema   . Shortness of breath   . Subclavian artery stenosis, left ([place])   . Vitamin D deficiency   . Wears dentures    top  . Wears glasses    reading    SURGICAL HISTORY: Past Surgical History:  Procedure Laterality Date   . BREAST BIOPSY    . BREAST LUMPECTOMY     right 2015  . BREAST LUMPECTOMY WITH RADIOACTIVE SEED LOCALIZATION Right 12/20/2013   Procedure: RIGHT BREAST LUMPECTOMY WITH RADIOACTIVE SEED LOCALIZATION;  Surgeon: Erroll Luna, MD;  Location: [facility];  Service: General;  Laterality: Right;  . CAROTID DUPLEX  2014  . CATARACT EXTRACTION     both eyes    I have reviewed the social history and family history with the patient and they are unchanged from previous note.  ALLERGIES:  has No Known Allergies.  MEDICATIONS:  Current Outpatient Medications  Medication Sig Dispense Refill  . acetaminophen (TYLENOL) 325 MG tablet Take 650 mg by mouth every 6 (six) hours as needed.    . Cholecalciferol (VITAMIN D) 2000 UNITS CAPS Take 1 capsule by mouth daily.    . Cyanocobalamin (VITAMIN B 12 PO) Take 1 tablet by mouth daily.    . DiphenhydrAMINE HCl (BENADRYL PO) Take 1 tablet by mouth as needed.    . famotidine (PEPCID) 20 MG tablet 1 TABLET AT BEDTIME AS NEEDED EVERY OTHER DAY, WITH BREAKFAST ORALLY 90 DAYS    . Fluticasone-Umeclidin-Vilant (TRELEGY ELLIPTA) 100-62.5-25 MCG/INH AEPB Inhale 1 puff into the lungs daily. 1 each 0  . Fluticasone-Umeclidin-Vilant (TRELEGY ELLIPTA) 100-62.5-25 MCG/INH AEPB Take 1 puff by mouth daily at 2 am. 2 each 0  . furosemide (LASIX) 40 MG tablet Take 1 tablet (40 mg total) by mouth 2 (two) times daily. <PLEASE MAKE APPOINTMENT FOR REFILLS> (Patient taking differently: Take 40 mg by mouth daily. <PLEASE MAKE APPOINTMENT FOR REFILLS>) 180 tablet 2  . lisinopril (PRINIVIL,ZESTRIL) 40 MG tablet Take 40 mg by mouth daily.    Marland Kitchen lovastatin (MEVACOR) 40 MG tablet Take 40 mg by mouth at bedtime.    Marland Kitchen omeprazole (PRILOSEC) 20 MG capsule Take 20 mg by mouth daily.    Marland Kitchen PROAIR HFA 108 (90 Base) MCG/ACT inhaler TAKE 2 PUFFS BY MOUTH EVERY 6 HOURS AS NEEDED FOR WHEEZE OR SHORTNESS OF BREATH 8.5 Inhaler 11  . KLOR-CON M20 20 MEQ tablet TAKE 1 TABLET BY MOUTH  DAILY (Patient not taking: Reported on 12/24/2018) 90 tablet 2  . nystatin (MYCOSTATIN/NYSTOP) powder Apply topically 3 (three) times daily. 45 g 0   No current facility-administered medications for this visit.     PHYSICAL EXAMINATION:  Vitals:   12/24/18 1132 12/24/18 1142  BP: (!) 172/55 (!) 143/53  Pulse: (!) 111   Resp: 18   Temp: 98 F (36.7 C)   SpO2: 100%    Filed Weights   12/24/18 1132 12/24/18 1137  Weight: 185 lb 12.8 oz (84.3 kg) 185 lb 12.8 oz (84.3 kg)    GENERAL:alert, no distress and comfortable SKIN: intertriginous rash right breast fold  EYES:  sclera clear LYMPH:  no palpable cervical or supraclavicular lymphadenopathy LUNGS: diminished with normal breathing effort HEART: regular rate & rhythm, mild bilateral lower extremity edema NEURO: alert & oriented x 3 with fluent speech Breast: breast are symmetrical without nipple discharge. No palpable mass in either breast  or axilla that I could appreciate   LABORATORY DATA:  I have reviewed the data as listed CBC Latest Ref Rng & Units 12/24/2018 06/15/2017 11/07/2016  WBC 4.0 - 10.5 K/uL 6.4 6.7 5.4  Hemoglobin 12.0 - 15.0 g/dL 9.3(L) 9.5(L) 9.6(L)  Hematocrit 36.0 - 46.0 % 30.3(L) 30.7(L) 31.0(L)  Platelets 150 - 400 K/uL 223 187 205     CMP Latest Ref Rng & Units 12/24/2018 07/17/2017 06/15/2017  Glucose 70 - 99 mg/dL 109(H) 106(H) 120  BUN 8 - 23 mg/dL 30(H) 26(H) 31(H)  Creatinine 0.44 - 1.00 mg/dL 1.39(H) 1.39(H) 1.45(H)  Sodium 135 - 145 mmol/L 145 144 142  Potassium 3.5 - 5.1 mmol/L 4.6 4.3 4.6  Chloride 98 - 111 mmol/L 106 104 102  CO2 22 - 32 mmol/L 29 32 31(H)  Calcium 8.9 - 10.3 mg/dL 8.8(L) 9.5 10.1  Total Protein 6.5 - 8.1 g/dL 6.7 - 7.4  Total Bilirubin 0.3 - 1.2 mg/dL 0.3 - 0.3  Alkaline Phos 38 - 126 U/L 70 - 78  AST 15 - 41 U/L 9(L) - 14  ALT 0 - 44 U/L 6 - 9      RADIOGRAPHIC STUDIES: I have personally reviewed the radiological images as listed and agreed with the findings in the  report. No results found.   ASSESSMENT & PLAN: Courtney Cline is a 85 y.o. female with    1. Right breast invasive ductal carcinoma, T1 cN0 M0, stage Ia,G2, ER 100% positive, PR 100% positive, HER-2 negative. -She was diagnosed in 10/2013. She is s/p right breast lumpectomy.   -Giving her advanced age and severe comorbidity, and early stage breast cancer, adjuvant chemotherapy was not recommended  -Her case was presented in breast tumor board, adjuvant RT was not recommended. -Due to ER/PR positivity, she started anastrozole in 11/2013. She tolerates well with mild tolerable hot flash.  -she completed 5 years in 11/2018, she will discontinue  -Courtney Cline is clinically doing well. Labs are stable, breast exam unremarkable except intertriginous rash, no clinical concern for recurrence  -mammogram in 10/2018 was negative.  -she prefers to f/u with PCP in the future, I recommend to continue surveillance with annual breast exam and mammogram. I recommend for the patient to do her own exam on the same 1 day every month. She agrees. I will cc my note to her PCP -f/u open, we will see her as needed in the future.   2. Anemia of chronic disease  -This is likely anemia of chronic disease, secondary to her CKD -Her previous anemia workup including folic acid, Y77, ferritin and iron level were normal. SPEP was negative -iron/ferritin remain normal today, Hgb stable 9.3 -Dr. Burr Medico previously discussed if hgb should fall < 9 we could aid with this by providing an EPO injection.  -f/u with PCP   3. Osteopenia  -Her repeated bone density scan in 09/2015 showed osteopenia, high risk for fracture (10 year risk of hip fracture 3.3%) -She will continue vitamin D 2000 units daily. On chewable calcium pill due to difficulty swallowing large pill -Dr. Burr Medico previously discussed benefit of bisphosphonate and or Prolia, due to her high-risk fracture. She was not interested. -She is overdue for DEXA, she  will call and schedule it when she is ready. Will f/u on report.   4. Severe COPD, on home oxygen 3L/min -followed by pulmonologist Dr. [place] Bells.  -respiratory status is stable, no change in O2 requirement   5. CKD -F/u PCP -stable  PLAN: -Labs,  mammogram reviewed, no clinical concern for recurrence  -Nystatin powder for intertriginous rash, keep breast fold open to air when able -Continue breast cancer surveillance and annual mammogram per PCP (patient request) -Discontinue anastrozole, patient completed 5 years and tolerated well  -continue f/u with PCP -f/u open   No problem-specific Assessment & Plan notes found for this encounter.   Orders Placed This Encounter  Procedures  . CBC with Differential (Cancer Center Only)    Standing Status:   Future    Number of Occurrences:   1    Standing Expiration Date:   12/24/2019  . CMP ([place] only)    Standing Status:   Future    Number of Occurrences:   1    Standing Expiration Date:   12/24/2019   All questions were answered. The patient knows to call the clinic with any problems, questions or concerns. No barriers to learning was detected. I spent 20 minutes counseling the patient face to face. The total time spent in the appointment was 25 minutes and more than 50% was on counseling and review of test results     Alla Feeling, NP 12/24/18

## 2018-12-24 ENCOUNTER — Inpatient Hospital Stay (HOSPITAL_BASED_OUTPATIENT_CLINIC_OR_DEPARTMENT_OTHER): Payer: PPO | Admitting: Nurse Practitioner

## 2018-12-24 ENCOUNTER — Other Ambulatory Visit: Payer: Self-pay

## 2018-12-24 ENCOUNTER — Inpatient Hospital Stay: Payer: PPO | Attending: Nurse Practitioner

## 2018-12-24 ENCOUNTER — Encounter: Payer: Self-pay | Admitting: Nurse Practitioner

## 2018-12-24 VITALS — BP 143/53 | HR 111 | Temp 98.0°F | Resp 18 | Ht <= 58 in | Wt 185.8 lb

## 2018-12-24 DIAGNOSIS — Z9981 Dependence on supplemental oxygen: Secondary | ICD-10-CM | POA: Diagnosis not present

## 2018-12-24 DIAGNOSIS — N189 Chronic kidney disease, unspecified: Secondary | ICD-10-CM | POA: Diagnosis not present

## 2018-12-24 DIAGNOSIS — Z170 Estrogen receptor positive status [ER+]: Secondary | ICD-10-CM | POA: Diagnosis not present

## 2018-12-24 DIAGNOSIS — C50211 Malignant neoplasm of upper-inner quadrant of right female breast: Secondary | ICD-10-CM

## 2018-12-24 DIAGNOSIS — Z79899 Other long term (current) drug therapy: Secondary | ICD-10-CM | POA: Insufficient documentation

## 2018-12-24 DIAGNOSIS — D638 Anemia in other chronic diseases classified elsewhere: Secondary | ICD-10-CM | POA: Diagnosis not present

## 2018-12-24 DIAGNOSIS — R21 Rash and other nonspecific skin eruption: Secondary | ICD-10-CM | POA: Diagnosis not present

## 2018-12-24 DIAGNOSIS — E785 Hyperlipidemia, unspecified: Secondary | ICD-10-CM | POA: Diagnosis not present

## 2018-12-24 DIAGNOSIS — I10 Essential (primary) hypertension: Secondary | ICD-10-CM | POA: Diagnosis not present

## 2018-12-24 DIAGNOSIS — J449 Chronic obstructive pulmonary disease, unspecified: Secondary | ICD-10-CM | POA: Insufficient documentation

## 2018-12-24 DIAGNOSIS — M8580 Other specified disorders of bone density and structure, unspecified site: Secondary | ICD-10-CM | POA: Insufficient documentation

## 2018-12-24 LAB — CMP (CANCER CENTER ONLY)
ALT: 6 U/L (ref 0–44)
AST: 9 U/L — ABNORMAL LOW (ref 15–41)
Albumin: 3.7 g/dL (ref 3.5–5.0)
Alkaline Phosphatase: 70 U/L (ref 38–126)
Anion gap: 10 (ref 5–15)
BUN: 30 mg/dL — ABNORMAL HIGH (ref 8–23)
CO2: 29 mmol/L (ref 22–32)
Calcium: 8.8 mg/dL — ABNORMAL LOW (ref 8.9–10.3)
Chloride: 106 mmol/L (ref 98–111)
Creatinine: 1.39 mg/dL — ABNORMAL HIGH (ref 0.44–1.00)
GFR, Est AFR Am: 41 mL/min — ABNORMAL LOW (ref >60)
GFR, Estimated: 35 mL/min — ABNORMAL LOW (ref >60)
Glucose, Bld: 109 mg/dL — ABNORMAL HIGH (ref 70–99)
Potassium: 4.6 mmol/L (ref 3.5–5.1)
Sodium: 145 mmol/L (ref 135–145)
Total Bilirubin: 0.3 mg/dL (ref 0.3–1.2)
Total Protein: 6.7 g/dL (ref 6.5–8.1)

## 2018-12-24 LAB — IRON AND TIBC
Iron: 55 ug/dL (ref 41–142)
Saturation Ratios: 23 % (ref 21–57)
TIBC: 235 ug/dL — ABNORMAL LOW (ref 236–444)
UIBC: 180 ug/dL (ref 120–384)

## 2018-12-24 LAB — CBC WITH DIFFERENTIAL (CANCER CENTER ONLY)
Abs Immature Granulocytes: 0.02 10*3/uL (ref 0.00–0.07)
Basophils Absolute: 0 10*3/uL (ref 0.0–0.1)
Basophils Relative: 0 %
Eosinophils Absolute: 0.1 10*3/uL (ref 0.0–0.5)
Eosinophils Relative: 2 %
HCT: 30.3 % — ABNORMAL LOW (ref 36.0–46.0)
Hemoglobin: 9.3 g/dL — ABNORMAL LOW (ref 12.0–15.0)
Immature Granulocytes: 0 %
Lymphocytes Relative: 18 %
Lymphs Abs: 1.2 10*3/uL (ref 0.7–4.0)
MCH: 30.5 pg (ref 26.0–34.0)
MCHC: 30.7 g/dL (ref 30.0–36.0)
MCV: 99.3 fL (ref 80.0–100.0)
Monocytes Absolute: 0.5 10*3/uL (ref 0.1–1.0)
Monocytes Relative: 8 %
Neutro Abs: 4.6 10*3/uL (ref 1.7–7.7)
Neutrophils Relative %: 72 %
Platelet Count: 223 10*3/uL (ref 150–400)
RBC: 3.05 MIL/uL — ABNORMAL LOW (ref 3.87–5.11)
RDW: 13.2 % (ref 11.5–15.5)
WBC Count: 6.4 10*3/uL (ref 4.0–10.5)
nRBC: 0 % (ref 0.0–0.2)

## 2018-12-24 LAB — VITAMIN D 25 HYDROXY (VIT D DEFICIENCY, FRACTURES): Vit D, 25-Hydroxy: 31.21 ng/mL (ref 30–100)

## 2018-12-24 LAB — FERRITIN: Ferritin: 153 ng/mL (ref 11–307)

## 2018-12-24 MED ORDER — NYSTATIN 100000 UNIT/GM EX POWD: Freq: Three times a day (TID) | CUTANEOUS | 0 refills | Status: DC

## 2018-12-25 ENCOUNTER — Telehealth: Payer: Self-pay | Admitting: Nurse Practitioner

## 2018-12-25 NOTE — Telephone Encounter (Signed)
No los per 1/19. 

## 2018-12-31 DIAGNOSIS — D638 Anemia in other chronic diseases classified elsewhere: Secondary | ICD-10-CM | POA: Diagnosis not present

## 2018-12-31 DIAGNOSIS — D0511 Intraductal carcinoma in situ of right breast: Secondary | ICD-10-CM | POA: Diagnosis not present

## 2018-12-31 DIAGNOSIS — I10 Essential (primary) hypertension: Secondary | ICD-10-CM | POA: Diagnosis not present

## 2018-12-31 DIAGNOSIS — N183 Chronic kidney disease, stage 3 unspecified: Secondary | ICD-10-CM | POA: Diagnosis not present

## 2018-12-31 DIAGNOSIS — E785 Hyperlipidemia, unspecified: Secondary | ICD-10-CM | POA: Diagnosis not present

## 2018-12-31 DIAGNOSIS — Z853 Personal history of malignant neoplasm of breast: Secondary | ICD-10-CM | POA: Diagnosis not present

## 2018-12-31 DIAGNOSIS — J449 Chronic obstructive pulmonary disease, unspecified: Secondary | ICD-10-CM | POA: Diagnosis not present

## 2018-12-31 DIAGNOSIS — D509 Iron deficiency anemia, unspecified: Secondary | ICD-10-CM | POA: Diagnosis not present

## 2019-01-03 DIAGNOSIS — J9610 Chronic respiratory failure, unspecified whether with hypoxia or hypercapnia: Secondary | ICD-10-CM | POA: Diagnosis not present

## 2019-01-03 DIAGNOSIS — R0902 Hypoxemia: Secondary | ICD-10-CM | POA: Diagnosis not present

## 2019-01-03 DIAGNOSIS — J449 Chronic obstructive pulmonary disease, unspecified: Secondary | ICD-10-CM | POA: Diagnosis not present

## 2019-01-29 ENCOUNTER — Other Ambulatory Visit: Payer: Self-pay | Admitting: Hematology

## 2019-01-29 DIAGNOSIS — C50211 Malignant neoplasm of upper-inner quadrant of right female breast: Secondary | ICD-10-CM

## 2019-01-29 DIAGNOSIS — Z171 Estrogen receptor negative status [ER-]: Secondary | ICD-10-CM

## 2019-02-05 ENCOUNTER — Ambulatory Visit: Payer: PPO | Admitting: Podiatry

## 2019-02-11 DIAGNOSIS — J449 Chronic obstructive pulmonary disease, unspecified: Secondary | ICD-10-CM | POA: Diagnosis not present

## 2019-02-11 DIAGNOSIS — Z0000 Encounter for general adult medical examination without abnormal findings: Secondary | ICD-10-CM | POA: Diagnosis not present

## 2019-02-11 DIAGNOSIS — E785 Hyperlipidemia, unspecified: Secondary | ICD-10-CM | POA: Diagnosis not present

## 2019-02-11 DIAGNOSIS — Z853 Personal history of malignant neoplasm of breast: Secondary | ICD-10-CM | POA: Diagnosis not present

## 2019-02-11 DIAGNOSIS — I10 Essential (primary) hypertension: Secondary | ICD-10-CM | POA: Diagnosis not present

## 2019-02-11 DIAGNOSIS — N183 Chronic kidney disease, stage 3 unspecified: Secondary | ICD-10-CM | POA: Diagnosis not present

## 2019-02-11 DIAGNOSIS — E559 Vitamin D deficiency, unspecified: Secondary | ICD-10-CM | POA: Diagnosis not present

## 2019-02-11 DIAGNOSIS — M85859 Other specified disorders of bone density and structure, unspecified thigh: Secondary | ICD-10-CM | POA: Diagnosis not present

## 2019-02-11 DIAGNOSIS — K219 Gastro-esophageal reflux disease without esophagitis: Secondary | ICD-10-CM | POA: Diagnosis not present

## 2019-02-11 DIAGNOSIS — D509 Iron deficiency anemia, unspecified: Secondary | ICD-10-CM | POA: Diagnosis not present

## 2019-02-11 DIAGNOSIS — Z1389 Encounter for screening for other disorder: Secondary | ICD-10-CM | POA: Diagnosis not present

## 2019-02-11 DIAGNOSIS — Z9981 Dependence on supplemental oxygen: Secondary | ICD-10-CM | POA: Diagnosis not present

## 2019-03-05 DIAGNOSIS — J9610 Chronic respiratory failure, unspecified whether with hypoxia or hypercapnia: Secondary | ICD-10-CM | POA: Diagnosis not present

## 2019-03-05 DIAGNOSIS — R0902 Hypoxemia: Secondary | ICD-10-CM | POA: Diagnosis not present

## 2019-03-05 DIAGNOSIS — J449 Chronic obstructive pulmonary disease, unspecified: Secondary | ICD-10-CM | POA: Diagnosis not present

## 2019-03-11 DIAGNOSIS — D638 Anemia in other chronic diseases classified elsewhere: Secondary | ICD-10-CM | POA: Diagnosis not present

## 2019-03-11 DIAGNOSIS — Z853 Personal history of malignant neoplasm of breast: Secondary | ICD-10-CM | POA: Diagnosis not present

## 2019-03-11 DIAGNOSIS — D509 Iron deficiency anemia, unspecified: Secondary | ICD-10-CM | POA: Diagnosis not present

## 2019-03-11 DIAGNOSIS — J449 Chronic obstructive pulmonary disease, unspecified: Secondary | ICD-10-CM | POA: Diagnosis not present

## 2019-03-11 DIAGNOSIS — E785 Hyperlipidemia, unspecified: Secondary | ICD-10-CM | POA: Diagnosis not present

## 2019-03-11 DIAGNOSIS — I10 Essential (primary) hypertension: Secondary | ICD-10-CM | POA: Diagnosis not present

## 2019-03-11 DIAGNOSIS — D0511 Intraductal carcinoma in situ of right breast: Secondary | ICD-10-CM | POA: Diagnosis not present

## 2019-03-13 DIAGNOSIS — I10 Essential (primary) hypertension: Secondary | ICD-10-CM | POA: Diagnosis not present

## 2019-03-13 DIAGNOSIS — N1832 Chronic kidney disease, stage 3b: Secondary | ICD-10-CM | POA: Diagnosis not present

## 2019-03-13 DIAGNOSIS — R350 Frequency of micturition: Secondary | ICD-10-CM | POA: Diagnosis not present

## 2019-03-26 DIAGNOSIS — D509 Iron deficiency anemia, unspecified: Secondary | ICD-10-CM | POA: Diagnosis not present

## 2019-03-26 DIAGNOSIS — J449 Chronic obstructive pulmonary disease, unspecified: Secondary | ICD-10-CM | POA: Diagnosis not present

## 2019-03-26 DIAGNOSIS — D638 Anemia in other chronic diseases classified elsewhere: Secondary | ICD-10-CM | POA: Diagnosis not present

## 2019-03-26 DIAGNOSIS — I10 Essential (primary) hypertension: Secondary | ICD-10-CM | POA: Diagnosis not present

## 2019-03-26 DIAGNOSIS — E785 Hyperlipidemia, unspecified: Secondary | ICD-10-CM | POA: Diagnosis not present

## 2019-03-26 DIAGNOSIS — Z853 Personal history of malignant neoplasm of breast: Secondary | ICD-10-CM | POA: Diagnosis not present

## 2019-03-26 DIAGNOSIS — D0511 Intraductal carcinoma in situ of right breast: Secondary | ICD-10-CM | POA: Diagnosis not present

## 2019-04-05 DIAGNOSIS — J9610 Chronic respiratory failure, unspecified whether with hypoxia or hypercapnia: Secondary | ICD-10-CM | POA: Diagnosis not present

## 2019-04-05 DIAGNOSIS — J449 Chronic obstructive pulmonary disease, unspecified: Secondary | ICD-10-CM | POA: Diagnosis not present

## 2019-04-05 DIAGNOSIS — R0902 Hypoxemia: Secondary | ICD-10-CM | POA: Diagnosis not present

## 2019-04-13 ENCOUNTER — Other Ambulatory Visit: Payer: Self-pay | Admitting: Pulmonary Disease

## 2019-04-15 ENCOUNTER — Other Ambulatory Visit: Payer: Self-pay | Admitting: Hematology

## 2019-04-15 DIAGNOSIS — C50211 Malignant neoplasm of upper-inner quadrant of right female breast: Secondary | ICD-10-CM

## 2019-04-15 DIAGNOSIS — Z171 Estrogen receptor negative status [ER-]: Secondary | ICD-10-CM

## 2019-04-23 DIAGNOSIS — C44529 Squamous cell carcinoma of skin of other part of trunk: Secondary | ICD-10-CM | POA: Diagnosis not present

## 2019-04-23 DIAGNOSIS — L821 Other seborrheic keratosis: Secondary | ICD-10-CM | POA: Diagnosis not present

## 2019-04-23 DIAGNOSIS — L820 Inflamed seborrheic keratosis: Secondary | ICD-10-CM | POA: Diagnosis not present

## 2019-04-23 DIAGNOSIS — D485 Neoplasm of uncertain behavior of skin: Secondary | ICD-10-CM | POA: Diagnosis not present

## 2019-04-23 DIAGNOSIS — D225 Melanocytic nevi of trunk: Secondary | ICD-10-CM | POA: Diagnosis not present

## 2019-05-01 DIAGNOSIS — I10 Essential (primary) hypertension: Secondary | ICD-10-CM | POA: Diagnosis not present

## 2019-05-01 DIAGNOSIS — N183 Chronic kidney disease, stage 3 unspecified: Secondary | ICD-10-CM | POA: Diagnosis not present

## 2019-05-01 DIAGNOSIS — Z853 Personal history of malignant neoplasm of breast: Secondary | ICD-10-CM | POA: Diagnosis not present

## 2019-05-01 DIAGNOSIS — D509 Iron deficiency anemia, unspecified: Secondary | ICD-10-CM | POA: Diagnosis not present

## 2019-05-01 DIAGNOSIS — J449 Chronic obstructive pulmonary disease, unspecified: Secondary | ICD-10-CM | POA: Diagnosis not present

## 2019-05-01 DIAGNOSIS — E785 Hyperlipidemia, unspecified: Secondary | ICD-10-CM | POA: Diagnosis not present

## 2019-05-01 DIAGNOSIS — D0511 Intraductal carcinoma in situ of right breast: Secondary | ICD-10-CM | POA: Diagnosis not present

## 2019-05-01 DIAGNOSIS — D638 Anemia in other chronic diseases classified elsewhere: Secondary | ICD-10-CM | POA: Diagnosis not present

## 2019-05-03 DIAGNOSIS — R0902 Hypoxemia: Secondary | ICD-10-CM | POA: Diagnosis not present

## 2019-05-03 DIAGNOSIS — J449 Chronic obstructive pulmonary disease, unspecified: Secondary | ICD-10-CM | POA: Diagnosis not present

## 2019-05-03 DIAGNOSIS — J9610 Chronic respiratory failure, unspecified whether with hypoxia or hypercapnia: Secondary | ICD-10-CM | POA: Diagnosis not present

## 2019-05-06 DIAGNOSIS — C44529 Squamous cell carcinoma of skin of other part of trunk: Secondary | ICD-10-CM | POA: Diagnosis not present

## 2019-05-06 DIAGNOSIS — L570 Actinic keratosis: Secondary | ICD-10-CM | POA: Diagnosis not present

## 2019-05-15 ENCOUNTER — Other Ambulatory Visit: Payer: Self-pay | Admitting: Acute Care

## 2019-05-16 ENCOUNTER — Other Ambulatory Visit: Payer: Self-pay | Admitting: Hematology

## 2019-05-16 ENCOUNTER — Other Ambulatory Visit: Payer: Self-pay | Admitting: Acute Care

## 2019-05-16 DIAGNOSIS — C50211 Malignant neoplasm of upper-inner quadrant of right female breast: Secondary | ICD-10-CM

## 2019-05-16 DIAGNOSIS — Z171 Estrogen receptor negative status [ER-]: Secondary | ICD-10-CM

## 2019-06-03 DIAGNOSIS — R0902 Hypoxemia: Secondary | ICD-10-CM | POA: Diagnosis not present

## 2019-06-03 DIAGNOSIS — J9610 Chronic respiratory failure, unspecified whether with hypoxia or hypercapnia: Secondary | ICD-10-CM | POA: Diagnosis not present

## 2019-06-03 DIAGNOSIS — J449 Chronic obstructive pulmonary disease, unspecified: Secondary | ICD-10-CM | POA: Diagnosis not present

## 2019-06-12 DIAGNOSIS — E785 Hyperlipidemia, unspecified: Secondary | ICD-10-CM | POA: Diagnosis not present

## 2019-06-12 DIAGNOSIS — D509 Iron deficiency anemia, unspecified: Secondary | ICD-10-CM | POA: Diagnosis not present

## 2019-06-12 DIAGNOSIS — Z853 Personal history of malignant neoplasm of breast: Secondary | ICD-10-CM | POA: Diagnosis not present

## 2019-06-12 DIAGNOSIS — I10 Essential (primary) hypertension: Secondary | ICD-10-CM | POA: Diagnosis not present

## 2019-06-12 DIAGNOSIS — D638 Anemia in other chronic diseases classified elsewhere: Secondary | ICD-10-CM | POA: Diagnosis not present

## 2019-06-12 DIAGNOSIS — J449 Chronic obstructive pulmonary disease, unspecified: Secondary | ICD-10-CM | POA: Diagnosis not present

## 2019-06-12 DIAGNOSIS — N183 Chronic kidney disease, stage 3 unspecified: Secondary | ICD-10-CM | POA: Diagnosis not present

## 2019-06-12 DIAGNOSIS — D0511 Intraductal carcinoma in situ of right breast: Secondary | ICD-10-CM | POA: Diagnosis not present

## 2019-06-13 ENCOUNTER — Telehealth: Payer: Self-pay | Admitting: Acute Care

## 2019-06-13 MED ORDER — TRELEGY ELLIPTA 100-62.5-25 MCG/INH IN AEPB: INHALATION_SPRAY | RESPIRATORY_TRACT | 2 refills | Status: DC

## 2019-06-13 NOTE — Telephone Encounter (Signed)
Spoke with pt. She is needing a refill on Trelegy. Pt is overdue for a follow up appointment. She has been scheduled to see [name] on 07/17/2019 at 1330. Rx has been sent in. Nothing further needed.

## 2019-06-19 ENCOUNTER — Other Ambulatory Visit: Payer: Self-pay

## 2019-06-19 MED ORDER — TRELEGY ELLIPTA 100-62.5-25 MCG/INH IN AEPB: INHALATION_SPRAY | RESPIRATORY_TRACT | 2 refills | Status: DC

## 2019-07-03 DIAGNOSIS — J449 Chronic obstructive pulmonary disease, unspecified: Secondary | ICD-10-CM | POA: Diagnosis not present

## 2019-07-03 DIAGNOSIS — J9610 Chronic respiratory failure, unspecified whether with hypoxia or hypercapnia: Secondary | ICD-10-CM | POA: Diagnosis not present

## 2019-07-03 DIAGNOSIS — R0902 Hypoxemia: Secondary | ICD-10-CM | POA: Diagnosis not present

## 2019-07-04 DIAGNOSIS — D0511 Intraductal carcinoma in situ of right breast: Secondary | ICD-10-CM | POA: Diagnosis not present

## 2019-07-04 DIAGNOSIS — D638 Anemia in other chronic diseases classified elsewhere: Secondary | ICD-10-CM | POA: Diagnosis not present

## 2019-07-04 DIAGNOSIS — D509 Iron deficiency anemia, unspecified: Secondary | ICD-10-CM | POA: Diagnosis not present

## 2019-07-04 DIAGNOSIS — Z853 Personal history of malignant neoplasm of breast: Secondary | ICD-10-CM | POA: Diagnosis not present

## 2019-07-04 DIAGNOSIS — I10 Essential (primary) hypertension: Secondary | ICD-10-CM | POA: Diagnosis not present

## 2019-07-04 DIAGNOSIS — E785 Hyperlipidemia, unspecified: Secondary | ICD-10-CM | POA: Diagnosis not present

## 2019-07-04 DIAGNOSIS — N183 Chronic kidney disease, stage 3 unspecified: Secondary | ICD-10-CM | POA: Diagnosis not present

## 2019-07-04 DIAGNOSIS — J449 Chronic obstructive pulmonary disease, unspecified: Secondary | ICD-10-CM | POA: Diagnosis not present

## 2019-07-17 ENCOUNTER — Encounter: Payer: Self-pay | Admitting: Critical Care Medicine

## 2019-07-17 ENCOUNTER — Other Ambulatory Visit: Payer: Self-pay

## 2019-07-17 ENCOUNTER — Ambulatory Visit (INDEPENDENT_AMBULATORY_CARE_PROVIDER_SITE_OTHER): Payer: Medicare HMO | Admitting: Critical Care Medicine

## 2019-07-17 VITALS — BP 142/64 | HR 104 | Temp 98.3°F | Ht 61.0 in | Wt 176.2 lb

## 2019-07-17 DIAGNOSIS — J3489 Other specified disorders of nose and nasal sinuses: Secondary | ICD-10-CM | POA: Diagnosis not present

## 2019-07-17 DIAGNOSIS — J9611 Chronic respiratory failure with hypoxia: Secondary | ICD-10-CM | POA: Diagnosis not present

## 2019-07-17 DIAGNOSIS — R000 Tachycardia, unspecified: Secondary | ICD-10-CM | POA: Diagnosis not present

## 2019-07-17 DIAGNOSIS — J449 Chronic obstructive pulmonary disease, unspecified: Secondary | ICD-10-CM

## 2019-07-17 DIAGNOSIS — I4891 Unspecified atrial fibrillation: Secondary | ICD-10-CM | POA: Diagnosis not present

## 2019-07-17 MED ORDER — APIXABAN 5 MG PO TABS: 5.0000 mg | ORAL_TABLET | Freq: Two times a day (BID) | ORAL | 0 refills | Status: DC

## 2019-07-17 MED ORDER — TRELEGY ELLIPTA 100-62.5-25 MCG/INH IN AEPB: INHALATION_SPRAY | RESPIRATORY_TRACT | 11 refills | Status: DC

## 2019-07-17 MED ORDER — METOPROLOL TARTRATE 25 MG PO TABS
25.0000 mg | ORAL_TABLET | Freq: Two times a day (BID) | ORAL | 0 refills | Status: DC
Start: 2019-07-17 — End: 2023-11-04

## 2019-07-17 MED ORDER — AZELASTINE HCL 0.1 % NA SOLN: 2.0000 | Freq: Two times a day (BID) | NASAL | 12 refills | Status: DC

## 2019-07-17 NOTE — Patient Instructions (Addendum)
Thank you for visiting [name] at [facility]. We recommend the following: Orders Placed This Encounter  Procedures  . EKG 12-Lead   Orders Placed This Encounter  Procedures  . EKG 12-Lead    Meds ordered this encounter  Medications  . azelastine (ASTELIN) 0.1 % nasal spray    Sig: Place 2 sprays into both nostrils 2 (two) times daily. Use in each nostril as directed    Dispense:  30 mL    Refill:  12  . Fluticasone-Umeclidin-Vilant (TRELEGY ELLIPTA) 100-62.5-25 MCG/INH AEPB    Sig: INHALE 1 PUFF BY MOUTH EVERY DAY    Dispense:  60 each    Refill:  11    Order Specific Question:   Lot Number?    Answer:   [id number]    Order Specific Question:   Expiration Date?    Answer:   11/15/2019    Order Specific Question:   Manufacturer?    Answer:   [employer] [12]    Order Specific Question:   Quantity    Answer:   2  . metoprolol tartrate (LOPRESSOR) 25 MG tablet    Sig: Take 1 tablet (25 mg total) by mouth 2 (two) times daily.    Dispense:  60 tablet    Refill:  0  . apixaban (ELIQUIS) 5 MG TABS tablet    Sig: Take 1 tablet (5 mg total) by mouth 2 (two) times daily.    Dispense:  60 tablet    Refill:  0     If allergy symptoms are worse than normal, you can take zyrtec 10mg  once daily and flonase 2 sprays bilaterally once daily.   Follow up with Primary care doctor about atrial fibrillation and the new medications that we are starting today. If you have any bleeding issues, stop Eliquis right away and get medical attention.   Return in about 6 months (around 01/16/2020).    Please do your part to reduce the spread of COVID-19.

## 2019-07-17 NOTE — Progress Notes (Signed)
Synopsis: Referred in 2015 for COPD by Leeroy Cha,*. Formerly a patient of Dr. Lake Bells.  Subjective:   PATIENT ID: Courtney Cline GENDER: female DOB: 17-Jul-1933, MRN: [id number]  Chief Complaint  Patient presents with  . Follow-up    SOB with activity unchanged, 3L O2 at all times     Mrs. Courtney Cline is an 86 year old woman with a history of COPD, chronic hypoxic respiratory failure, remote tobacco abuse who presents for follow-up.  Her last COPD exacerbation was in 2019 due to allergies.  At that time she was prescribed Flonase and Zyrtec.  She has been having more sinus congestion, postnasal drip, sneezing due to allergies recently, but stopped taking Zyrtec due to perceived ineffectiveness.  She did not notice a benefit with Claritin either.  She has not been using Flonase. She has year-round rhinitis even when her allergies are at baseline.  She has been compliant with using 3 L supplemental oxygen continuously, including with sleeping.  She continues to use Trelegy daily.  She uses albuterol 2-4 times daily for shortness of breath with improvement in her symptoms.   She is able to walk around her house and around stores stopping frequently, but had to have a wheelchair to get from the lobby to the exam room today.  She has no cough, wheezing, sputum production. No CP, palpitations. She has had leg edema since missing a dose of lasix recently. She has occasional epistaxis, but no other bleeding. She is UTD on her Covid vaccine, and her entire family has been vaccinated.      Past Medical History:  Diagnosis Date  . Allergic rhinitis   . Arthritis   . Breast calcification seen on mammogram    Right breast  . Breast cancer ([place])   . COPD (chronic obstructive pulmonary disease) ([place])   . GERD (gastroesophageal reflux disease)   . Hyperglycemia   . Hyperlipidemia   . Hypertension   . Low back pain   . On home oxygen therapy    all the time  . Osteopenia   .  Peripheral edema   . Shortness of breath   . Subclavian artery stenosis, left ([place])   . Vitamin D deficiency   . Wears dentures    top  . Wears glasses    reading     Family History  Problem Relation Age of Onset  . Emphysema Mother   . Emphysema Maternal Grandfather      Past Surgical History:  Procedure Laterality Date  . BREAST BIOPSY    . BREAST LUMPECTOMY     right 2015  . BREAST LUMPECTOMY WITH RADIOACTIVE SEED LOCALIZATION Right 12/20/2013   Procedure: RIGHT BREAST LUMPECTOMY WITH RADIOACTIVE SEED LOCALIZATION;  Surgeon: Erroll Luna, MD;  Location: [facility];  Service: General;  Laterality: Right;  . CAROTID DUPLEX  2014  . CATARACT EXTRACTION     both eyes    Social History   Socioeconomic History  . Marital status: Married    Spouse name: Not on file  . Number of children: Not on file  . Years of education: Not on file  . Highest education level: Not on file  Occupational History  . Occupation: retired    Comment: Chartered certified accountant  Tobacco Use  . Smoking status: Former Smoker    Packs/day: 1.00    Years: 40.00    Pack years: 40.00    Types: Cigarettes    Quit date: 02/14/1994    Years since quitting: 25.4  .  Smokeless tobacco: Never Used  Substance and Sexual Activity  . Alcohol use: No  . Drug use: No  . Sexual activity: Not on file  Other Topics Concern  . Not on file  Social History Narrative  . Not on file   Social Determinants of Health   Financial Resource Strain:   . Difficulty of Paying Living Expenses:   Food Insecurity:   . Worried About Charity fundraiser in the Last Year:   . Arboriculturist in the Last Year:   Transportation Needs:   . Film/video editor (Medical):   Marland Kitchen Lack of Transportation (Non-Medical):   Physical Activity:   . Days of Exercise per Week:   . Minutes of Exercise per Session:   Stress:   . Feeling of Stress :   Social Connections:   . Frequency of Communication with Friends and Family:    . Frequency of Social Gatherings with Friends and Family:   . Attends Religious Services:   . Active Member of Clubs or Organizations:   . Attends Archivist Meetings:   Marland Kitchen Marital Status:   Intimate Partner Violence:   . Fear of Current or Ex-Partner:   . Emotionally Abused:   Marland Kitchen Physically Abused:   . Sexually Abused:      No Known Allergies   Immunization History  Administered Date(s) Administered  . Fluad Quad(high Dose 65+) 10/15/2018  . Influenza Split 11/23/2012, 11/14/2014  . Influenza, High Dose Seasonal PF 11/16/2015, 11/10/2016  . Influenza,inj,Quad PF,6+ Mos 11/11/2013  . Influenza-Unspecified 11/17/2017  . Moderna SARS-COVID-2 Vaccination 04/13/2019, 05/11/2019  . Pneumococcal Conjugate-13 12/11/2013  . Pneumococcal Polysaccharide-23 11/15/2002  . Tdap 11/10/2011    Outpatient Medications Prior to Visit  Medication Sig Dispense Refill  . acetaminophen (TYLENOL) 325 MG tablet Take 650 mg by mouth every 6 (six) hours as needed.    . Cholecalciferol (VITAMIN D) 2000 UNITS CAPS Take 1 capsule by mouth daily.    . Cyanocobalamin (VITAMIN B 12 PO) Take 1 tablet by mouth daily.    . DiphenhydrAMINE HCl (BENADRYL PO) Take 1 tablet by mouth as needed.    . famotidine (PEPCID) 20 MG tablet 1 TABLET AT BEDTIME AS NEEDED EVERY OTHER DAY, WITH BREAKFAST ORALLY 90 DAYS    . furosemide (LASIX) 40 MG tablet Take 1 tablet (40 mg total) by mouth 2 (two) times daily. <PLEASE MAKE APPOINTMENT FOR REFILLS> (Patient taking differently: Take 40 mg by mouth daily. <PLEASE MAKE APPOINTMENT FOR REFILLS>) 180 tablet 2  . lisinopril (PRINIVIL,ZESTRIL) 40 MG tablet Take 40 mg by mouth daily.    Marland Kitchen lovastatin (MEVACOR) 40 MG tablet Take 40 mg by mouth at bedtime.    Marland Kitchen nystatin (MYCOSTATIN/NYSTOP) powder Apply topically 3 (three) times daily. 45 g 0  . omeprazole (PRILOSEC) 20 MG capsule Take 20 mg by mouth daily.    Marland Kitchen PROAIR HFA 108 (90 Base) MCG/ACT inhaler TAKE 2 PUFFS BY MOUTH EVERY  6 HOURS AS NEEDED FOR WHEEZE OR SHORTNESS OF BREATH 8.5 Inhaler 11  . Fluticasone-Umeclidin-Vilant (TRELEGY ELLIPTA) 100-62.5-25 MCG/INH AEPB INHALE 1 PUFF BY MOUTH EVERY DAY 60 each 2  . KLOR-CON M20 20 MEQ tablet TAKE 1 TABLET BY MOUTH DAILY (Patient not taking: Reported on 12/24/2018) 90 tablet 2   No facility-administered medications prior to visit.    Review of Systems  Constitutional: Negative for chills and fever.  HENT: Positive for congestion.   Cardiovascular: Positive for leg swelling.  Skin: Negative for rash.  Objective:   Vitals:   07/17/19 1337  BP: (!) 142/64  Pulse: (!) 104  Temp: 98.3 F (36.8 C)  TempSrc: Oral  SpO2: 99%  Weight: 176 lb 3.2 oz (79.9 kg)  Height: 5\' 1"  (1.549 m)   99% on 3 LPM  BMI Readings from Last 3 Encounters:  07/17/19 33.29 kg/m  12/24/18 38.83 kg/m  10/15/18 38.67 kg/m   Wt Readings from Last 3 Encounters:  07/17/19 176 lb 3.2 oz (79.9 kg)  12/24/18 185 lb 12.8 oz (84.3 kg)  10/15/18 185 lb (83.9 kg)    Physical Exam Vitals reviewed.  Constitutional:      General: She is not in acute distress.    Comments: Frail, chronically ill appearing  HENT:     Head: Normocephalic and atraumatic.  Eyes:     General: No scleral icterus. Cardiovascular:     Rate and Rhythm: Tachycardia present. Rhythm irregular.     Heart sounds: No murmur.  Pulmonary:     Comments: Breathing comfortably on 3 L nasal cannula, minimal expiratory wheezing, otherwise clear to auscultation. Abdominal:     General: There is no distension.     Palpations: Abdomen is soft.  Musculoskeletal:     Cervical back: Neck supple.     Comments: Mild bilateral ankle edema  Lymphadenopathy:     Cervical: No cervical adenopathy.  Skin:    General: Skin is warm and dry.     Findings: No rash.  Neurological:     General: No focal deficit present.     Mental Status: She is alert.     Coordination: Coordination normal.  Psychiatric:        Mood and  Affect: Mood normal.        Behavior: Behavior normal.      CBC    Component Value Date/Time   WBC 6.4 12/24/2018 1116   WBC 5.4 11/07/2016 0932   WBC 6.5 08/04/2016 1156   RBC 3.05 (L) 12/24/2018 1116   HGB 9.3 (L) 12/24/2018 1116   HGB 9.6 (L) 11/07/2016 0932   HCT 30.3 (L) 12/24/2018 1116   HCT 31.0 (L) 11/07/2016 0932   PLT 223 12/24/2018 1116   PLT 205 11/07/2016 0932   MCV 99.3 12/24/2018 1116   MCV 96.6 11/07/2016 0932   MCH 30.5 12/24/2018 1116   MCHC 30.7 12/24/2018 1116   RDW 13.2 12/24/2018 1116   RDW 13.4 11/07/2016 0932   LYMPHSABS 1.2 12/24/2018 1116   LYMPHSABS 1.3 11/07/2016 0932   MONOABS 0.5 12/24/2018 1116   MONOABS 0.4 11/07/2016 0932   EOSABS 0.1 12/24/2018 1116   EOSABS 0.2 11/07/2016 0932   BASOSABS 0.0 12/24/2018 1116   BASOSABS 0.0 11/07/2016 0932    CHEMISTRY No results for input(s): NA, K, CL, CO2, GLUCOSE, BUN, CREATININE, CALCIUM, MG, PHOS in the last 168 hours. CrCl cannot be calculated (Patient's most recent lab result is older than the maximum 21 days allowed.).   Chest Imaging- films reviewed: CXR, 2 view 02/12/2018-kyphoscoliosis, increased retrosternal airspace.  Upper lobe emphysema.  Right lower lobe calcified granulomas.  Pulmonary Functions Testing Results: No flowsheet data found.  2016 FEV1 31% pred on 3 L continuously>>Very severe obstruction, with low vital capacity. 2015 FEV1 33% pred, on 2L O2 continuously  EKG 07/17/19-mildly tachycardic, atrial fibrillation.     Assessment & Plan:     ICD-10-CM   1. COPD without exacerbation ([place])  J44.9 EKG 12-Lead  2. Chronic hypoxemic respiratory failure ([facility])  J96.11  3. Tachycardia  R00.0   4. Rhinorrhea  J34.89   5. New onset a-fib ([facility])  I48.91     COPD and chronic hypoxic respiratory failure; remote history of tobacco abuse. -Continue Trelegy once daily.  Rinse her mouth after every use. -Continue albuterol every 4 hours as needed -Continue regular physical  activity to maintain exercise tolerance -Continue supplemental oxygen- 3L -Continue to avoid tobacco -Up-to-date on Covid, flu, pneumonia vaccines.  Recommend ongoing mask use given high risk status and advanced age.  New-onset Afib on EKG; CHADS VASC = 4; HAS-BLED= 4 -EKG today to confirm. -Started on Eliquis 5 mg twice daily and metoprolol 25 mg twice daily.  Strongly encouraged follow-up with primary care doctor at first available appointment.  Has an appointment later this month.  She was instructed that if she has any head trauma while on Eliquis she must be evaluated in the emergency department.  She should stop Eliquis and seek medical attention if she has any bleeding while on Eliquis.  She understands that she has a higher risk of bleeding while on chronic anticoagulation.  Chronic rhinorrhea-related to allergies and likely vasomotor rhinitis -Azelastine 2 sprays bilaterally BID -Can use Zyrtec and Flonase once daily when allergy symptoms are worse.  RTC in 6 months.  >45 minutes spent on this encounter including time spent face to face with the patient, record review, and charting.    Current Outpatient Medications:  .  acetaminophen (TYLENOL) 325 MG tablet, Take 650 mg by mouth every 6 (six) hours as needed., Disp: , Rfl:  .  Cholecalciferol (VITAMIN D) 2000 UNITS CAPS, Take 1 capsule by mouth daily., Disp: , Rfl:  .  Cyanocobalamin (VITAMIN B 12 PO), Take 1 tablet by mouth daily., Disp: , Rfl:  .  DiphenhydrAMINE HCl (BENADRYL PO), Take 1 tablet by mouth as needed., Disp: , Rfl:  .  famotidine (PEPCID) 20 MG tablet, 1 TABLET AT BEDTIME AS NEEDED EVERY OTHER DAY, WITH BREAKFAST ORALLY 90 DAYS, Disp: , Rfl:  .  Fluticasone-Umeclidin-Vilant (TRELEGY ELLIPTA) 100-62.5-25 MCG/INH AEPB, INHALE 1 PUFF BY MOUTH EVERY DAY, Disp: 60 each, Rfl: 11 .  furosemide (LASIX) 40 MG tablet, Take 1 tablet (40 mg total) by mouth 2 (two) times daily. <PLEASE MAKE APPOINTMENT FOR REFILLS> (Patient  taking differently: Take 40 mg by mouth daily. <PLEASE MAKE APPOINTMENT FOR REFILLS>), Disp: 180 tablet, Rfl: 2 .  lisinopril (PRINIVIL,ZESTRIL) 40 MG tablet, Take 40 mg by mouth daily., Disp: , Rfl:  .  lovastatin (MEVACOR) 40 MG tablet, Take 40 mg by mouth at bedtime., Disp: , Rfl:  .  nystatin (MYCOSTATIN/NYSTOP) powder, Apply topically 3 (three) times daily., Disp: 45 g, Rfl: 0 .  omeprazole (PRILOSEC) 20 MG capsule, Take 20 mg by mouth daily., Disp: , Rfl:  .  PROAIR HFA 108 (90 Base) MCG/ACT inhaler, TAKE 2 PUFFS BY MOUTH EVERY 6 HOURS AS NEEDED FOR WHEEZE OR SHORTNESS OF BREATH, Disp: 8.5 Inhaler, Rfl: 11 .  apixaban (ELIQUIS) 5 MG TABS tablet, Take 1 tablet (5 mg total) by mouth 2 (two) times daily., Disp: 60 tablet, Rfl: 0 .  azelastine (ASTELIN) 0.1 % nasal spray, Place 2 sprays into both nostrils 2 (two) times daily. Use in each nostril as directed, Disp: 30 mL, Rfl: 12 .  KLOR-CON M20 20 MEQ tablet, TAKE 1 TABLET BY MOUTH DAILY (Patient not taking: Reported on 12/24/2018), Disp: 90 tablet, Rfl: 2 .  metoprolol tartrate (LOPRESSOR) 25 MG tablet, Take 1 tablet (25 mg total) by  mouth 2 (two) times daily., Disp: 60 tablet, Rfl: 0     Julian Hy, DO [facility] 07/17/2019 5:40 PM

## 2019-07-29 DIAGNOSIS — J449 Chronic obstructive pulmonary disease, unspecified: Secondary | ICD-10-CM | POA: Diagnosis not present

## 2019-07-29 DIAGNOSIS — I480 Paroxysmal atrial fibrillation: Secondary | ICD-10-CM | POA: Diagnosis not present

## 2019-07-29 DIAGNOSIS — I10 Essential (primary) hypertension: Secondary | ICD-10-CM | POA: Diagnosis not present

## 2019-08-03 DIAGNOSIS — J9610 Chronic respiratory failure, unspecified whether with hypoxia or hypercapnia: Secondary | ICD-10-CM | POA: Diagnosis not present

## 2019-08-03 DIAGNOSIS — R0902 Hypoxemia: Secondary | ICD-10-CM | POA: Diagnosis not present

## 2019-08-03 DIAGNOSIS — J449 Chronic obstructive pulmonary disease, unspecified: Secondary | ICD-10-CM | POA: Diagnosis not present

## 2019-08-13 DIAGNOSIS — D500 Iron deficiency anemia secondary to blood loss (chronic): Secondary | ICD-10-CM | POA: Diagnosis not present

## 2019-08-13 DIAGNOSIS — I10 Essential (primary) hypertension: Secondary | ICD-10-CM | POA: Diagnosis not present

## 2019-08-13 DIAGNOSIS — I480 Paroxysmal atrial fibrillation: Secondary | ICD-10-CM | POA: Diagnosis not present

## 2019-08-20 ENCOUNTER — Other Ambulatory Visit: Payer: Self-pay

## 2019-08-20 ENCOUNTER — Encounter: Payer: Self-pay | Admitting: Cardiovascular Disease

## 2019-08-20 ENCOUNTER — Ambulatory Visit: Payer: Medicare HMO | Admitting: Cardiovascular Disease

## 2019-08-20 DIAGNOSIS — I771 Stricture of artery: Secondary | ICD-10-CM | POA: Diagnosis not present

## 2019-08-20 DIAGNOSIS — R600 Localized edema: Secondary | ICD-10-CM | POA: Diagnosis not present

## 2019-08-20 DIAGNOSIS — I4819 Other persistent atrial fibrillation: Secondary | ICD-10-CM

## 2019-08-20 DIAGNOSIS — I10 Essential (primary) hypertension: Secondary | ICD-10-CM

## 2019-08-20 DIAGNOSIS — I5032 Chronic diastolic (congestive) heart failure: Secondary | ICD-10-CM | POA: Diagnosis not present

## 2019-08-20 DIAGNOSIS — J449 Chronic obstructive pulmonary disease, unspecified: Secondary | ICD-10-CM

## 2019-08-20 DIAGNOSIS — E782 Mixed hyperlipidemia: Secondary | ICD-10-CM

## 2019-08-20 NOTE — Assessment & Plan Note (Signed)
History of hyperlipidemia on lovastatin with lipid profile performed 02/03/2019 revealing a total cholesterol of 182, LDL 113 and HDL 53.

## 2019-08-20 NOTE — Assessment & Plan Note (Signed)
History of persistent A-fib rate controlled on Eliquis oral anticoagulation. 

## 2019-08-20 NOTE — Assessment & Plan Note (Signed)
Asymptomatic, treated conservatively.

## 2019-08-20 NOTE — Assessment & Plan Note (Signed)
History of COPD having smoked for many years and quit 25 years ago on chronic home O2.

## 2019-08-20 NOTE — Assessment & Plan Note (Signed)
History of essential hypertension with blood pressure measured today at 142/64.  She is on lisinopril and metoprolol.

## 2019-08-20 NOTE — Patient Instructions (Signed)
Medication Instructions:  Your physician recommends that you continue on your current medications as directed. Please refer to the Current Medication list given to you today.   *If you need a refill on your cardiac medications before your next appointment, please call your pharmacy*  Lab Work: NONE   Testing/Procedures: NONE   Follow-Up: At [facility], you and your health needs are our priority.  As part of our continuing mission to provide you with exceptional heart care, we have created designated Provider Care Teams.  These Care Teams include your primary Cardiologist (physician) and Advanced Practice Providers (APPs -  Physician Assistants and Nurse Practitioners) who all work together to provide you with the care you need, when you need it.  We recommend signing up for the patient portal called "MyChart".  Sign up information is provided on this After Visit Summary.  MyChart is used to connect with patients for Virtual Visits (Telemedicine).  Patients are able to view lab/test results, encounter notes, upcoming appointments, etc.  Non-urgent messages can be sent to your provider as well.   To learn more about what you can do with MyChart, go to [email or web address].    Your next appointment:    6 MONTHS WITH PA/NP You will receive a reminder letter in the mail two months in advance. If you don't receive a letter, please call our office to schedule the follow-up appointment.  12 MONTHS  WITH DR Gwenlyn Found You will receive a reminder letter in the mail two months in advance. If you don't receive a letter, please call our office to schedule the follow-up appointment.

## 2019-08-20 NOTE — Assessment & Plan Note (Signed)
History of diastolic heart failure with echo performed 04/30/2014 revealing normal EF with grade 1 diastolic dysfunction.  She is on oral diuretics.

## 2019-08-20 NOTE — Progress Notes (Signed)
08/20/2019 Courtney Cline [facility]   04-29-1933  [id number]  Primary Physician Leeroy Cha, MD Primary Cardiologist: Lorretta Harp MD Courtney Cline, [place]  HPI:  Courtney Cline is a 86 y.o.   married Caucasian female mother of 2, grandmother 4 grandchildren who is accompanied by her son Courtney Cline today.  She was initially referred to me by Dr. Antony Blackbird for peripheral vascular evaluation because of presumed left subclavian artery stenosis and/or steel. I last saw her in the office 03/17/2015. Her cardiac risk factor profile is notable for remote tobacco abuse having quit 22 years ago, treated hypertension, and hyperlipidemia. She has never had a heart attack or stroke. She was a symptomatically with regards to left upper extremity claudication or dizziness and therefore continue conservative treatment of her subclavian artery stenosis was recommended at that time. She has noticed increasing bilateral lower extremity edema over the last one month which has improved somewhat with the addition of an oral diuretic. She does admit to dietary indiscretion. She is on chronic home O2 for her COPD and has not noticed any increasing shortness of breath.  Since I saw her 3-1/2 years ago she is developed atrial fibrillation and is currently on Eliquis oral anticoagulation, rate controlled.  She has not noticed any increase in her shortness of breath and denies chest pain.  Her lower extremity edema is only mild on oral diuretics.   Current Meds  Medication Sig  . acetaminophen (TYLENOL) 325 MG tablet Take 650 mg by mouth every 6 (six) hours as needed.  Marland Kitchen apixaban (ELIQUIS) 5 MG TABS tablet Take 1 tablet (5 mg total) by mouth 2 (two) times daily.  Marland Kitchen azelastine (ASTELIN) 0.1 % nasal spray Place 2 sprays into both nostrils 2 (two) times daily. Use in each nostril as directed  . Cholecalciferol (VITAMIN D) 2000 UNITS CAPS Take 1 capsule by mouth daily.  . Cyanocobalamin (VITAMIN B 12 PO)  Take 1 tablet by mouth daily.  . DiphenhydrAMINE HCl (BENADRYL PO) Take 1 tablet by mouth as needed.  . famotidine (PEPCID) 20 MG tablet 1 TABLET AT BEDTIME AS NEEDED EVERY OTHER DAY, WITH BREAKFAST ORALLY 90 DAYS  . Fluticasone-Umeclidin-Vilant (TRELEGY ELLIPTA) 100-62.5-25 MCG/INH AEPB INHALE 1 PUFF BY MOUTH EVERY DAY  . furosemide (LASIX) 40 MG tablet Take 1 tablet (40 mg total) by mouth 2 (two) times daily. <PLEASE MAKE APPOINTMENT FOR REFILLS> (Patient taking differently: Take 40 mg by mouth daily. <PLEASE MAKE APPOINTMENT FOR REFILLS>)  . KLOR-CON M20 20 MEQ tablet TAKE 1 TABLET BY MOUTH DAILY  . lisinopril (PRINIVIL,ZESTRIL) 40 MG tablet Take 40 mg by mouth daily.  Marland Kitchen lovastatin (MEVACOR) 40 MG tablet Take 40 mg by mouth at bedtime.  . metoprolol tartrate (LOPRESSOR) 25 MG tablet Take 1 tablet (25 mg total) by mouth 2 (two) times daily.  Marland Kitchen nystatin (MYCOSTATIN/NYSTOP) powder Apply topically 3 (three) times daily.  Marland Kitchen omeprazole (PRILOSEC) 20 MG capsule Take 20 mg by mouth daily.  Marland Kitchen PROAIR HFA 108 (90 Base) MCG/ACT inhaler TAKE 2 PUFFS BY MOUTH EVERY 6 HOURS AS NEEDED FOR WHEEZE OR SHORTNESS OF BREATH     No Known Allergies  Social History   Socioeconomic History  . Marital status: Married    Spouse name: Not on file  . Number of children: Not on file  . Years of education: Not on file  . Highest education level: Not on file  Occupational History  . Occupation: retired    Comment: Chartered certified accountant  Tobacco Use  . Smoking status: Former Smoker    Packs/day: 1.00    Years: 40.00    Pack years: 40.00    Types: Cigarettes    Quit date: 02/14/1994    Years since quitting: 25.5  . Smokeless tobacco: Never Used  Substance and Sexual Activity  . Alcohol use: No  . Drug use: No  . Sexual activity: Not on file  Other Topics Concern  . Not on file  Social History Narrative  . Not on file   Social Determinants of Health   Financial Resource Strain:   . Difficulty of Paying Living  Expenses:   Food Insecurity:   . Worried About Charity fundraiser in the Last Year:   . Arboriculturist in the Last Year:   Transportation Needs:   . Film/video editor (Medical):   Marland Kitchen Lack of Transportation (Non-Medical):   Physical Activity:   . Days of Exercise per Week:   . Minutes of Exercise per Session:   Stress:   . Feeling of Stress :   Social Connections:   . Frequency of Communication with Friends and Family:   . Frequency of Social Gatherings with Friends and Family:   . Attends Religious Services:   . Active Member of Clubs or Organizations:   . Attends Archivist Meetings:   Marland Kitchen Marital Status:   Intimate Partner Violence:   . Fear of Current or Ex-Partner:   . Emotionally Abused:   Marland Kitchen Physically Abused:   . Sexually Abused:      Review of Systems: General: negative for chills, fever, night sweats or weight changes.  Cardiovascular: negative for chest pain, dyspnea on exertion, edema, orthopnea, palpitations, paroxysmal nocturnal dyspnea or shortness of breath Dermatological: negative for rash Respiratory: negative for cough or wheezing Urologic: negative for hematuria Abdominal: negative for nausea, vomiting, diarrhea, bright red blood per rectum, melena, or hematemesis Neurologic: negative for visual changes, syncope, or dizziness All other systems reviewed and are otherwise negative except as noted above.    Blood pressure (!) 142/64, pulse 83, height 5' (1.524 m), weight 174 lb 6.4 oz (79.1 kg), SpO2 97 %.  General appearance: alert and no distress Neck: no adenopathy, no carotid bruit, no JVD, supple, symmetrical, trachea midline and thyroid not enlarged, symmetric, no tenderness/mass/nodules Lungs: Distant breath sounds Heart: irregularly irregular rhythm Extremities: 1+ bilateral lower extremity edema Pulses: 2+ and symmetric Skin: Skin color, texture, turgor normal. No rashes or lesions Neurologic: Alert and oriented X 3, normal strength  and tone. Normal symmetric reflexes. Normal coordination and gait  EKG sinus rhythm at 60 without ST or T wave changes.  I personally reviewed this EKG.  ASSESSMENT AND PLAN:   Subclavian artery stenosis, left Asymptomatic, treated conservatively.  Bilateral lower extremity edema Bilateral lower extreme edema on oral diuretics  Essential hypertension History of essential hypertension with blood pressure measured today at 142/64.  She is on lisinopril and metoprolol.  Hyperlipidemia History of hyperlipidemia on lovastatin with lipid profile performed 02/03/2019 revealing a total cholesterol of 182, LDL 113 and HDL 53.  Diastolic CHF ([place]) History of diastolic heart failure with echo performed 04/30/2014 revealing normal EF with grade 1 diastolic dysfunction.  She is on oral diuretics.  COPD without exacerbation ([place]) History of COPD having smoked for many years and quit 25 years ago on chronic home O2.  Persistent atrial fibrillation ([facility]) History of persistent A. fib rate controlled on Eliquis oral anticoagulation.  Lorretta Harp MD FACP,FACC,FAHA, [facility] 08/20/2019 4:45 PM

## 2019-08-20 NOTE — Assessment & Plan Note (Signed)
Bilateral lower extreme edema on oral diuretics 

## 2019-08-27 NOTE — Addendum Note (Signed)
Addended by: Alvina Filbert B on: 08/27/2019 09:57 AM   Modules accepted: Orders

## 2019-09-02 DIAGNOSIS — J449 Chronic obstructive pulmonary disease, unspecified: Secondary | ICD-10-CM | POA: Diagnosis not present

## 2019-09-02 DIAGNOSIS — R0902 Hypoxemia: Secondary | ICD-10-CM | POA: Diagnosis not present

## 2019-09-02 DIAGNOSIS — J9610 Chronic respiratory failure, unspecified whether with hypoxia or hypercapnia: Secondary | ICD-10-CM | POA: Diagnosis not present

## 2019-09-09 DIAGNOSIS — N183 Chronic kidney disease, stage 3 unspecified: Secondary | ICD-10-CM | POA: Diagnosis not present

## 2019-09-09 DIAGNOSIS — Z853 Personal history of malignant neoplasm of breast: Secondary | ICD-10-CM | POA: Diagnosis not present

## 2019-09-09 DIAGNOSIS — D509 Iron deficiency anemia, unspecified: Secondary | ICD-10-CM | POA: Diagnosis not present

## 2019-09-09 DIAGNOSIS — I480 Paroxysmal atrial fibrillation: Secondary | ICD-10-CM | POA: Diagnosis not present

## 2019-09-09 DIAGNOSIS — J449 Chronic obstructive pulmonary disease, unspecified: Secondary | ICD-10-CM | POA: Diagnosis not present

## 2019-09-09 DIAGNOSIS — D0511 Intraductal carcinoma in situ of right breast: Secondary | ICD-10-CM | POA: Diagnosis not present

## 2019-09-09 DIAGNOSIS — E785 Hyperlipidemia, unspecified: Secondary | ICD-10-CM | POA: Diagnosis not present

## 2019-09-09 DIAGNOSIS — I10 Essential (primary) hypertension: Secondary | ICD-10-CM | POA: Diagnosis not present

## 2019-09-09 DIAGNOSIS — D638 Anemia in other chronic diseases classified elsewhere: Secondary | ICD-10-CM | POA: Diagnosis not present

## 2019-10-03 DIAGNOSIS — J9610 Chronic respiratory failure, unspecified whether with hypoxia or hypercapnia: Secondary | ICD-10-CM | POA: Diagnosis not present

## 2019-10-03 DIAGNOSIS — R0902 Hypoxemia: Secondary | ICD-10-CM | POA: Diagnosis not present

## 2019-10-03 DIAGNOSIS — J449 Chronic obstructive pulmonary disease, unspecified: Secondary | ICD-10-CM | POA: Diagnosis not present

## 2019-10-08 ENCOUNTER — Telehealth: Payer: Self-pay | Admitting: Critical Care Medicine

## 2019-10-09 MED ORDER — TRELEGY ELLIPTA 100-62.5-25 MCG/INH IN AEPB
1.0000 | INHALATION_SPRAY | Freq: Every day | RESPIRATORY_TRACT | 11 refills | Status: DC
Start: 2019-10-09 — End: 2019-10-23

## 2019-10-09 NOTE — Telephone Encounter (Signed)
Script printed and placed in B pod for Derl Barrow NP to sign.

## 2019-10-09 NOTE — Telephone Encounter (Signed)
Spoke with Cecille Rubin, advocate from patient's pharmacy.  She states they already have the patient assistance application and it does not require the physician's signature, all they need is a fax cover sheet and a script for the Trelegy 100 faxed to [phone].

## 2019-10-10 DIAGNOSIS — Z85828 Personal history of other malignant neoplasm of skin: Secondary | ICD-10-CM | POA: Diagnosis not present

## 2019-10-10 DIAGNOSIS — L814 Other melanin hyperpigmentation: Secondary | ICD-10-CM | POA: Diagnosis not present

## 2019-10-10 DIAGNOSIS — L905 Scar conditions and fibrosis of skin: Secondary | ICD-10-CM | POA: Diagnosis not present

## 2019-10-10 DIAGNOSIS — C44329 Squamous cell carcinoma of skin of other parts of face: Secondary | ICD-10-CM | POA: Diagnosis not present

## 2019-10-10 DIAGNOSIS — D485 Neoplasm of uncertain behavior of skin: Secondary | ICD-10-CM | POA: Diagnosis not present

## 2019-10-14 DIAGNOSIS — I10 Essential (primary) hypertension: Secondary | ICD-10-CM | POA: Diagnosis not present

## 2019-10-14 DIAGNOSIS — D0511 Intraductal carcinoma in situ of right breast: Secondary | ICD-10-CM | POA: Diagnosis not present

## 2019-10-14 DIAGNOSIS — D509 Iron deficiency anemia, unspecified: Secondary | ICD-10-CM | POA: Diagnosis not present

## 2019-10-14 DIAGNOSIS — Z853 Personal history of malignant neoplasm of breast: Secondary | ICD-10-CM | POA: Diagnosis not present

## 2019-10-14 DIAGNOSIS — N183 Chronic kidney disease, stage 3 unspecified: Secondary | ICD-10-CM | POA: Diagnosis not present

## 2019-10-14 DIAGNOSIS — J449 Chronic obstructive pulmonary disease, unspecified: Secondary | ICD-10-CM | POA: Diagnosis not present

## 2019-10-14 DIAGNOSIS — I480 Paroxysmal atrial fibrillation: Secondary | ICD-10-CM | POA: Diagnosis not present

## 2019-10-14 DIAGNOSIS — E785 Hyperlipidemia, unspecified: Secondary | ICD-10-CM | POA: Diagnosis not present

## 2019-10-14 DIAGNOSIS — D500 Iron deficiency anemia secondary to blood loss (chronic): Secondary | ICD-10-CM | POA: Diagnosis not present

## 2019-10-23 ENCOUNTER — Other Ambulatory Visit: Payer: Self-pay

## 2019-10-23 MED ORDER — TRELEGY ELLIPTA 100-62.5-25 MCG/INH IN AEPB: 1.0000 | INHALATION_SPRAY | Freq: Every day | RESPIRATORY_TRACT | 11 refills | Status: DC

## 2019-10-24 DIAGNOSIS — Z853 Personal history of malignant neoplasm of breast: Secondary | ICD-10-CM | POA: Diagnosis not present

## 2019-10-24 DIAGNOSIS — I10 Essential (primary) hypertension: Secondary | ICD-10-CM | POA: Diagnosis not present

## 2019-10-24 DIAGNOSIS — D638 Anemia in other chronic diseases classified elsewhere: Secondary | ICD-10-CM | POA: Diagnosis not present

## 2019-10-24 DIAGNOSIS — N183 Chronic kidney disease, stage 3 unspecified: Secondary | ICD-10-CM | POA: Diagnosis not present

## 2019-10-24 DIAGNOSIS — I480 Paroxysmal atrial fibrillation: Secondary | ICD-10-CM | POA: Diagnosis not present

## 2019-10-24 DIAGNOSIS — D0511 Intraductal carcinoma in situ of right breast: Secondary | ICD-10-CM | POA: Diagnosis not present

## 2019-10-24 DIAGNOSIS — E785 Hyperlipidemia, unspecified: Secondary | ICD-10-CM | POA: Diagnosis not present

## 2019-10-24 DIAGNOSIS — J449 Chronic obstructive pulmonary disease, unspecified: Secondary | ICD-10-CM | POA: Diagnosis not present

## 2019-10-24 DIAGNOSIS — D509 Iron deficiency anemia, unspecified: Secondary | ICD-10-CM | POA: Diagnosis not present

## 2019-10-29 DIAGNOSIS — C44329 Squamous cell carcinoma of skin of other parts of face: Secondary | ICD-10-CM | POA: Diagnosis not present

## 2019-11-03 DIAGNOSIS — J9610 Chronic respiratory failure, unspecified whether with hypoxia or hypercapnia: Secondary | ICD-10-CM | POA: Diagnosis not present

## 2019-11-03 DIAGNOSIS — R0902 Hypoxemia: Secondary | ICD-10-CM | POA: Diagnosis not present

## 2019-11-03 DIAGNOSIS — J449 Chronic obstructive pulmonary disease, unspecified: Secondary | ICD-10-CM | POA: Diagnosis not present

## 2019-12-02 DIAGNOSIS — I10 Essential (primary) hypertension: Secondary | ICD-10-CM | POA: Diagnosis not present

## 2019-12-02 DIAGNOSIS — D509 Iron deficiency anemia, unspecified: Secondary | ICD-10-CM | POA: Diagnosis not present

## 2019-12-02 DIAGNOSIS — D500 Iron deficiency anemia secondary to blood loss (chronic): Secondary | ICD-10-CM | POA: Diagnosis not present

## 2019-12-02 DIAGNOSIS — N183 Chronic kidney disease, stage 3 unspecified: Secondary | ICD-10-CM | POA: Diagnosis not present

## 2019-12-02 DIAGNOSIS — E785 Hyperlipidemia, unspecified: Secondary | ICD-10-CM | POA: Diagnosis not present

## 2019-12-02 DIAGNOSIS — I480 Paroxysmal atrial fibrillation: Secondary | ICD-10-CM | POA: Diagnosis not present

## 2019-12-02 DIAGNOSIS — J449 Chronic obstructive pulmonary disease, unspecified: Secondary | ICD-10-CM | POA: Diagnosis not present

## 2019-12-02 DIAGNOSIS — Z853 Personal history of malignant neoplasm of breast: Secondary | ICD-10-CM | POA: Diagnosis not present

## 2019-12-03 DIAGNOSIS — J9610 Chronic respiratory failure, unspecified whether with hypoxia or hypercapnia: Secondary | ICD-10-CM | POA: Diagnosis not present

## 2019-12-03 DIAGNOSIS — R0902 Hypoxemia: Secondary | ICD-10-CM | POA: Diagnosis not present

## 2019-12-03 DIAGNOSIS — J449 Chronic obstructive pulmonary disease, unspecified: Secondary | ICD-10-CM | POA: Diagnosis not present

## 2019-12-10 ENCOUNTER — Other Ambulatory Visit: Payer: Self-pay | Admitting: Acute Care

## 2019-12-10 DIAGNOSIS — L905 Scar conditions and fibrosis of skin: Secondary | ICD-10-CM | POA: Diagnosis not present

## 2019-12-10 DIAGNOSIS — Z85828 Personal history of other malignant neoplasm of skin: Secondary | ICD-10-CM | POA: Diagnosis not present

## 2019-12-13 ENCOUNTER — Other Ambulatory Visit: Payer: Self-pay | Admitting: Internal Medicine

## 2019-12-13 DIAGNOSIS — Z9889 Other specified postprocedural states: Secondary | ICD-10-CM

## 2020-01-03 DIAGNOSIS — R0902 Hypoxemia: Secondary | ICD-10-CM | POA: Diagnosis not present

## 2020-01-03 DIAGNOSIS — J449 Chronic obstructive pulmonary disease, unspecified: Secondary | ICD-10-CM | POA: Diagnosis not present

## 2020-01-03 DIAGNOSIS — J9610 Chronic respiratory failure, unspecified whether with hypoxia or hypercapnia: Secondary | ICD-10-CM | POA: Diagnosis not present

## 2020-01-14 DIAGNOSIS — Z853 Personal history of malignant neoplasm of breast: Secondary | ICD-10-CM | POA: Diagnosis not present

## 2020-01-14 DIAGNOSIS — D0511 Intraductal carcinoma in situ of right breast: Secondary | ICD-10-CM | POA: Diagnosis not present

## 2020-01-14 DIAGNOSIS — D509 Iron deficiency anemia, unspecified: Secondary | ICD-10-CM | POA: Diagnosis not present

## 2020-01-14 DIAGNOSIS — I480 Paroxysmal atrial fibrillation: Secondary | ICD-10-CM | POA: Diagnosis not present

## 2020-01-14 DIAGNOSIS — I10 Essential (primary) hypertension: Secondary | ICD-10-CM | POA: Diagnosis not present

## 2020-01-14 DIAGNOSIS — J449 Chronic obstructive pulmonary disease, unspecified: Secondary | ICD-10-CM | POA: Diagnosis not present

## 2020-01-14 DIAGNOSIS — K219 Gastro-esophageal reflux disease without esophagitis: Secondary | ICD-10-CM | POA: Diagnosis not present

## 2020-01-14 DIAGNOSIS — N183 Chronic kidney disease, stage 3 unspecified: Secondary | ICD-10-CM | POA: Diagnosis not present

## 2020-01-14 DIAGNOSIS — E785 Hyperlipidemia, unspecified: Secondary | ICD-10-CM | POA: Diagnosis not present

## 2020-01-20 ENCOUNTER — Inpatient Hospital Stay (HOSPITAL_COMMUNITY)
Admission: EM | Admit: 2020-01-20 | Discharge: 2020-01-23 | DRG: 190 | Disposition: A | Discharge status: Home Health | Payer: Medicare HMO | Attending: Internal Medicine | Admitting: Internal Medicine

## 2020-01-20 DIAGNOSIS — D638 Anemia in other chronic diseases classified elsewhere: Secondary | ICD-10-CM | POA: Diagnosis not present

## 2020-01-20 DIAGNOSIS — Z7901 Long term (current) use of anticoagulants: Secondary | ICD-10-CM | POA: Diagnosis not present

## 2020-01-20 DIAGNOSIS — N3000 Acute cystitis without hematuria: Secondary | ICD-10-CM

## 2020-01-20 DIAGNOSIS — I10 Essential (primary) hypertension: Secondary | ICD-10-CM | POA: Diagnosis present

## 2020-01-20 DIAGNOSIS — N1832 Chronic kidney disease, stage 3b: Secondary | ICD-10-CM | POA: Diagnosis present

## 2020-01-20 DIAGNOSIS — K8020 Calculus of gallbladder without cholecystitis without obstruction: Secondary | ICD-10-CM | POA: Diagnosis not present

## 2020-01-20 DIAGNOSIS — J189 Pneumonia, unspecified organism: Secondary | ICD-10-CM | POA: Diagnosis not present

## 2020-01-20 DIAGNOSIS — C50211 Malignant neoplasm of upper-inner quadrant of right female breast: Secondary | ICD-10-CM | POA: Diagnosis not present

## 2020-01-20 DIAGNOSIS — R059 Cough, unspecified: Secondary | ICD-10-CM | POA: Diagnosis present

## 2020-01-20 DIAGNOSIS — Z87891 Personal history of nicotine dependence: Secondary | ICD-10-CM | POA: Diagnosis not present

## 2020-01-20 DIAGNOSIS — R0902 Hypoxemia: Secondary | ICD-10-CM

## 2020-01-20 DIAGNOSIS — M8580 Other specified disorders of bone density and structure, unspecified site: Secondary | ICD-10-CM | POA: Diagnosis present

## 2020-01-20 DIAGNOSIS — I5030 Unspecified diastolic (congestive) heart failure: Secondary | ICD-10-CM | POA: Diagnosis present

## 2020-01-20 DIAGNOSIS — N390 Urinary tract infection, site not specified: Secondary | ICD-10-CM | POA: Diagnosis not present

## 2020-01-20 DIAGNOSIS — I4819 Other persistent atrial fibrillation: Secondary | ICD-10-CM | POA: Diagnosis not present

## 2020-01-20 DIAGNOSIS — J441 Chronic obstructive pulmonary disease with (acute) exacerbation: Secondary | ICD-10-CM | POA: Diagnosis present

## 2020-01-20 DIAGNOSIS — Z170 Estrogen receptor positive status [ER+]: Secondary | ICD-10-CM | POA: Diagnosis not present

## 2020-01-20 DIAGNOSIS — I130 Hypertensive heart and chronic kidney disease with heart failure and stage 1 through stage 4 chronic kidney disease, or unspecified chronic kidney disease: Secondary | ICD-10-CM | POA: Diagnosis present

## 2020-01-20 DIAGNOSIS — R110 Nausea: Secondary | ICD-10-CM | POA: Diagnosis present

## 2020-01-20 DIAGNOSIS — J9611 Chronic respiratory failure with hypoxia: Secondary | ICD-10-CM | POA: Diagnosis not present

## 2020-01-20 DIAGNOSIS — K5730 Diverticulosis of large intestine without perforation or abscess without bleeding: Secondary | ICD-10-CM | POA: Diagnosis not present

## 2020-01-20 DIAGNOSIS — Z853 Personal history of malignant neoplasm of breast: Secondary | ICD-10-CM

## 2020-01-20 DIAGNOSIS — I771 Stricture of artery: Secondary | ICD-10-CM | POA: Diagnosis not present

## 2020-01-20 DIAGNOSIS — Z9981 Dependence on supplemental oxygen: Secondary | ICD-10-CM | POA: Diagnosis not present

## 2020-01-20 DIAGNOSIS — J440 Chronic obstructive pulmonary disease with acute lower respiratory infection: Secondary | ICD-10-CM | POA: Diagnosis not present

## 2020-01-20 DIAGNOSIS — M79609 Pain in unspecified limb: Secondary | ICD-10-CM | POA: Diagnosis not present

## 2020-01-20 DIAGNOSIS — Z20822 Contact with and (suspected) exposure to covid-19: Secondary | ICD-10-CM | POA: Diagnosis not present

## 2020-01-20 DIAGNOSIS — Z825 Family history of asthma and other chronic lower respiratory diseases: Secondary | ICD-10-CM | POA: Diagnosis not present

## 2020-01-20 DIAGNOSIS — Z7951 Long term (current) use of inhaled steroids: Secondary | ICD-10-CM

## 2020-01-20 DIAGNOSIS — R269 Unspecified abnormalities of gait and mobility: Secondary | ICD-10-CM | POA: Diagnosis not present

## 2020-01-20 DIAGNOSIS — M1990 Unspecified osteoarthritis, unspecified site: Secondary | ICD-10-CM | POA: Diagnosis present

## 2020-01-20 DIAGNOSIS — R609 Edema, unspecified: Secondary | ICD-10-CM | POA: Diagnosis not present

## 2020-01-20 DIAGNOSIS — I5032 Chronic diastolic (congestive) heart failure: Secondary | ICD-10-CM | POA: Diagnosis present

## 2020-01-20 DIAGNOSIS — J449 Chronic obstructive pulmonary disease, unspecified: Secondary | ICD-10-CM | POA: Diagnosis present

## 2020-01-20 DIAGNOSIS — E785 Hyperlipidemia, unspecified: Secondary | ICD-10-CM | POA: Diagnosis present

## 2020-01-20 DIAGNOSIS — J9610 Chronic respiratory failure, unspecified whether with hypoxia or hypercapnia: Secondary | ICD-10-CM | POA: Diagnosis not present

## 2020-01-20 DIAGNOSIS — I708 Atherosclerosis of other arteries: Secondary | ICD-10-CM | POA: Diagnosis present

## 2020-01-20 DIAGNOSIS — Z79899 Other long term (current) drug therapy: Secondary | ICD-10-CM

## 2020-01-20 DIAGNOSIS — E559 Vitamin D deficiency, unspecified: Secondary | ICD-10-CM | POA: Diagnosis present

## 2020-01-20 DIAGNOSIS — R079 Chest pain, unspecified: Secondary | ICD-10-CM | POA: Diagnosis not present

## 2020-01-20 DIAGNOSIS — R109 Unspecified abdominal pain: Secondary | ICD-10-CM | POA: Diagnosis not present

## 2020-01-20 DIAGNOSIS — K219 Gastro-esophageal reflux disease without esophagitis: Secondary | ICD-10-CM | POA: Diagnosis present

## 2020-01-20 DIAGNOSIS — R1310 Dysphagia, unspecified: Secondary | ICD-10-CM | POA: Diagnosis present

## 2020-01-20 LAB — COMPREHENSIVE METABOLIC PANEL
ALT: 9 U/L (ref 0–44)
AST: 14 U/L — ABNORMAL LOW (ref 15–41)
Albumin: 2.6 g/dL — ABNORMAL LOW (ref 3.5–5.0)
Alkaline Phosphatase: 66 U/L (ref 38–126)
Anion gap: 12 (ref 5–15)
BUN: 21 mg/dL (ref 8–23)
CO2: 24 mmol/L (ref 22–32)
Calcium: 9.2 mg/dL (ref 8.9–10.3)
Chloride: 101 mmol/L (ref 98–111)
Creatinine, Ser: 1.25 mg/dL — ABNORMAL HIGH (ref 0.44–1.00)
GFR, Estimated: 43 mL/min — ABNORMAL LOW (ref >60)
Glucose, Bld: 107 mg/dL — ABNORMAL HIGH (ref 70–99)
Potassium: 3.5 mmol/L (ref 3.5–5.1)
Sodium: 137 mmol/L (ref 135–145)
Total Bilirubin: 0.8 mg/dL (ref 0.3–1.2)
Total Protein: 6.7 g/dL (ref 6.5–8.1)

## 2020-01-20 LAB — URINALYSIS, ROUTINE W REFLEX MICROSCOPIC
Bilirubin Urine: NEGATIVE
Glucose, UA: NEGATIVE mg/dL
Ketones, ur: 5 mg/dL — AB
Nitrite: NEGATIVE
Protein, ur: 30 mg/dL — AB
Specific Gravity, Urine: 1.02 (ref 1.005–1.030)
WBC, UA: >50 WBC/hpf — ABNORMAL HIGH (ref 0–5)
pH: 5 (ref 5.0–8.0)

## 2020-01-20 LAB — LIPASE, BLOOD: Lipase: 21 U/L (ref 11–51)

## 2020-01-20 LAB — CBC
HCT: 31.5 % — ABNORMAL LOW (ref 36.0–46.0)
Hemoglobin: 9.5 g/dL — ABNORMAL LOW (ref 12.0–15.0)
MCH: 29.2 pg (ref 26.0–34.0)
MCHC: 30.2 g/dL (ref 30.0–36.0)
MCV: 96.9 fL (ref 80.0–100.0)
Platelets: 299 10*3/uL (ref 150–400)
RBC: 3.25 MIL/uL — ABNORMAL LOW (ref 3.87–5.11)
RDW: 12.9 % (ref 11.5–15.5)
WBC: 7.5 10*3/uL (ref 4.0–10.5)
nRBC: 0 % (ref 0.0–0.2)

## 2020-01-20 MED ORDER — ACETAMINOPHEN 500 MG PO TABS
1000.0000 mg | ORAL_TABLET | Freq: Once | ORAL | Status: AC
  Administered 2020-01-21: 1000 mg via ORAL
  Filled 2020-01-20: qty 2

## 2020-01-20 MED ORDER — SODIUM CHLORIDE 0.9 % IV SOLN
1.0000 g | Freq: Once | INTRAVENOUS | Status: AC
  Administered 2020-01-21: 1 g via INTRAVENOUS
  Filled 2020-01-20: qty 10

## 2020-01-20 MED ORDER — ONDANSETRON HCL 4 MG/2ML IJ SOLN: 4.0000 mg | Freq: Once | INTRAMUSCULAR | Status: DC

## 2020-01-20 MED ORDER — LACTATED RINGERS IV BOLUS
500.0000 mL | Freq: Once | INTRAVENOUS | Status: AC
  Administered 2020-01-21: 500 mL via INTRAVENOUS

## 2020-01-20 NOTE — ED Triage Notes (Signed)
Pt here from home with c/o n/v and abd pain , pt also states that her urine is dark and has an odor

## 2020-01-20 NOTE — ED Provider Notes (Signed)
[facility] EMERGENCY DEPARTMENT Provider Note   CSN: [id number] Arrival date & time: 01/20/20  1000     History CC: cough  Courtney Cline is a 86 y.o. female with a history of COPD, A. fib on Eliquis, hyperlipidemia, CHF who presents with left-sided back pain, abdominal pain, nausea/vomiting, shortness of breath, intermittent fever, dark urine, and cough.  She is here today because her cough acutely worsened with increased sputum production, sputum purulence/change in color, and had an episode of scant hemoptysis today.  She states that initially about 3 weeks ago she began having left-sided back pain and then the rest of her symptoms started about a week ago and worsened in the past few days.  She states she has been unable to tolerate a meal for the past 5 days, only drinking water and taking medications.  She states she has never felt like this before and the last time she was in the ER was when she gave birth.  Has right greater than left calf soreness and swelling but states this has been chronic since she had a fall about 2 years ago.  Has not taken her Eliquis in about a week. No dysuria.   Illness Quality:  Left back pain, N/V, cough with worsening sputum production, dark urine, abdominal pain, decreased appetite and PO intake Severity:  Moderate Onset quality:  Gradual Duration:  1 week Timing:  Constant Progression:  Worsening Chronicity:  New Relieved by:  Nothing Worsened by:  Nothing Associated symptoms: abdominal pain, chest pain, cough, fatigue, fever (T-max 101 F yesterday), nausea, shortness of breath and vomiting (once)   Associated symptoms: no diarrhea, no ear pain, no headaches (intermittent but none at this time), no loss of consciousness, no rash and no sore throat   Abdominal pain:    Location:  Epigastric and periumbilical   Severity:  Moderate   Duration:  1 week Cough:    Cough characteristics:  Productive   Sputum  characteristics:  Green and yellow (with one episode with scant blood)   Severity:  Moderate   Progression:  Worsening      Past Medical History:  Diagnosis Date  . Allergic rhinitis   . Arthritis   . Breast calcification seen on mammogram    Right breast  . Breast cancer ([place])   . COPD (chronic obstructive pulmonary disease) ([place])   . GERD (gastroesophageal reflux disease)   . Hyperglycemia   . Hyperlipidemia   . Hypertension   . Low back pain   . On home oxygen therapy    all the time  . Osteopenia   . Peripheral edema   . Shortness of breath   . Subclavian artery stenosis, left ([place])   . Vitamin D deficiency   . Wears dentures    top  . Wears glasses    reading    Patient Active Problem List   Diagnosis Date Noted  . Persistent atrial fibrillation ([place]) 08/20/2019  . COPD without exacerbation ([place]) 10/15/2018  . Healthcare maintenance 10/15/2018  . Physical deconditioning 10/15/2018  . Sinusitis 01/29/2018  . Diastolic CHF ([place]) 87/56/4332  . COPD exacerbation ([place]) 06/30/2017  . Vitamin D deficiency   . Allergic rhinitis 11/23/2015  . Osteopenia with high risk of fracture 11/05/2015  . Post-nasal drip 03/16/2015  . Anemia of chronic disease 03/10/2015  . Subclavian artery stenosis, left ([place]) 04/24/2014  . Bilateral lower extremity edema 04/24/2014  . Essential hypertension 04/24/2014  . Hyperlipidemia 04/24/2014  .  Breast cancer of upper-inner quadrant of right female breast ([place]) 11/19/2013  . Chronic hypoxemic respiratory failure ([place]) 10/02/2013    Past Surgical History:  Procedure Laterality Date  . BREAST BIOPSY    . BREAST LUMPECTOMY     right 2015  . BREAST LUMPECTOMY WITH RADIOACTIVE SEED LOCALIZATION Right 12/20/2013   Procedure: RIGHT BREAST LUMPECTOMY WITH RADIOACTIVE SEED LOCALIZATION;  Surgeon: Erroll Luna, MD;  Location: [facility];  Service: General;  Laterality: Right;  . CAROTID DUPLEX  2014  . CATARACT EXTRACTION      both eyes     OB History   No obstetric history on file.     Family History  Problem Relation Age of Onset  . Emphysema Mother   . Emphysema Maternal Grandfather     Social History   Tobacco Use  . Smoking status: Former Smoker    Packs/day: 1.00    Years: 40.00    Pack years: 40.00    Types: Cigarettes    Quit date: 02/14/1994    Years since quitting: 25.9  . Smokeless tobacco: Never Used  Substance Use Topics  . Alcohol use: No  . Drug use: No    Home Medications Prior to Admission medications   Medication Sig Start Date End Date Taking? Authorizing Provider  acetaminophen (TYLENOL) 325 MG tablet Take 650 mg by mouth every 6 (six) hours as needed.    [provider]  apixaban (ELIQUIS) 5 MG TABS tablet Take 1 tablet (5 mg total) by mouth 2 (two) times daily. 07/17/19   Julian Hy, DO  azelastine (ASTELIN) 0.1 % nasal spray Place 2 sprays into both nostrils 2 (two) times daily. Use in each nostril as directed 07/17/19   Julian Hy, DO  Cholecalciferol (VITAMIN D) 2000 UNITS CAPS Take 1 capsule by mouth daily.    [provider]  Cyanocobalamin (VITAMIN B 12 PO) Take 1 tablet by mouth daily.    [provider]  DiphenhydrAMINE HCl (BENADRYL PO) Take 1 tablet by mouth as needed.    [provider]  famotidine (PEPCID) 20 MG tablet 1 TABLET AT BEDTIME AS NEEDED EVERY OTHER DAY, WITH BREAKFAST ORALLY 90 DAYS 08/21/18   [provider]  Fluticasone-Umeclidin-Vilant (TRELEGY ELLIPTA) 100-62.5-25 MCG/INH AEPB INHALE 1 PUFF BY MOUTH EVERY DAY 07/17/19   Julian Hy, DO  Fluticasone-Umeclidin-Vilant (TRELEGY ELLIPTA) 100-62.5-25 MCG/INH AEPB Inhale 1 puff into the lungs daily. 10/23/19   Julian Hy, DO  furosemide (LASIX) 40 MG tablet Take 1 tablet (40 mg total) by mouth 2 (two) times daily. <PLEASE MAKE APPOINTMENT FOR REFILLS> Patient taking differently: Take 40 mg by mouth daily. <PLEASE MAKE APPOINTMENT FOR REFILLS> 10/27/16    Lorretta Harp, MD  KLOR-CON M20 20 MEQ tablet TAKE 1 TABLET BY MOUTH DAILY 09/14/15   Lorretta Harp, MD  lisinopril (PRINIVIL,ZESTRIL) 40 MG tablet Take 40 mg by mouth daily.    [provider]  lovastatin (MEVACOR) 40 MG tablet Take 40 mg by mouth at bedtime.    [provider]  metoprolol tartrate (LOPRESSOR) 25 MG tablet Take 1 tablet (25 mg total) by mouth 2 (two) times daily. 07/17/19   Julian Hy, DO  nystatin (MYCOSTATIN/NYSTOP) powder Apply topically 3 (three) times daily. 12/24/18   Alla Feeling, NP  omeprazole (PRILOSEC) 20 MG capsule Take 20 mg by mouth daily.    [provider]  PROAIR HFA 108 [phone] Base) MCG/ACT inhaler TAKE 2 PUFFS BY  MOUTH EVERY 6 HOURS AS NEEDED FOR WHEEZE OR SHORTNESS OF BREATH 08/16/17   Juanito Doom, MD    Allergies    Patient has no known allergies.  Review of Systems   Review of Systems  Constitutional: Positive for appetite change, fatigue and fever (T-max 101 F yesterday). Negative for chills.  HENT: Negative for ear pain and sore throat.   Eyes: Negative for pain and visual disturbance.  Respiratory: Positive for cough, chest tightness and shortness of breath.   Cardiovascular: Positive for chest pain and leg swelling. Negative for palpitations.  Gastrointestinal: Positive for abdominal pain, constipation, nausea and vomiting (once). Negative for diarrhea.  Genitourinary: Positive for decreased urine volume. Negative for dysuria and hematuria.  Musculoskeletal: Positive for back pain. Negative for neck pain.  Skin: Negative for color change and rash.  Neurological: Negative for seizures, loss of consciousness, syncope and headaches (intermittent but none at this time).  All other systems reviewed and are negative.   Physical Exam Updated Vital Signs BP (!) 143/54 (BP Location: Right Arm)   Pulse (!) 110   Temp 98.9 F (37.2 C) (Oral)   Resp 18   SpO2 96%   Physical Exam Vitals and nursing note  reviewed.  Constitutional:      General: She is not in acute distress.    Appearance: She is well-developed. She is not ill-appearing or toxic-appearing.  HENT:     Head: Normocephalic and atraumatic.     Right Ear: External ear normal.     Nose: Nose normal. No rhinorrhea.  Eyes:     Conjunctiva/sclera: Conjunctivae normal.  Cardiovascular:     Rate and Rhythm: Normal rate and regular rhythm.     Pulses: Normal pulses.          Radial pulses are 2+ on the right side and 2+ on the left side.       Posterior tibial pulses are 2+ on the right side and 2+ on the left side.     Heart sounds: No murmur heard.  No friction rub. No gallop.   Pulmonary:     Effort: Pulmonary effort is normal. No respiratory distress.     Breath sounds: Normal breath sounds. No wheezing, rhonchi or rales.     Comments: On 3L [place] baseline Abdominal:     Palpations: Abdomen is soft.     Tenderness: There is abdominal tenderness in the epigastric area and periumbilical area. There is no right CVA tenderness, left CVA tenderness, guarding or rebound.  Musculoskeletal:        General: Tenderness (Right calf) present.     Cervical back: Neck supple.     Right lower leg: Edema (R>L) present.     Left lower leg: Edema present.  Skin:    General: Skin is warm and dry.  Neurological:     Mental Status: She is alert and oriented to person, place, and time. Mental status is at baseline.     ED Results / Procedures / Treatments   Labs (all labs ordered are listed, but only abnormal results are displayed) Labs Reviewed  COMPREHENSIVE METABOLIC PANEL - Abnormal; Notable for the following components:      Result Value   Glucose, Bld 107 (*)    Creatinine, Ser 1.25 (*)    Albumin 2.6 (*)    AST 14 (*)    GFR, Estimated 43 (*)    All other components within normal limits  CBC - Abnormal; Notable for the following components:  RBC 3.25 (*)    Hemoglobin 9.5 (*)    HCT 31.5 (*)    All other components within  normal limits  URINALYSIS, ROUTINE W REFLEX MICROSCOPIC - Abnormal; Notable for the following components:   Color, Urine AMBER (*)    APPearance CLOUDY (*)    Hgb urine dipstick MODERATE (*)    Ketones, ur 5 (*)    Protein, ur 30 (*)    Leukocytes,Ua LARGE (*)    WBC, UA >50 (*)    Bacteria, UA MANY (*)    All other components within normal limits  URINE CULTURE  RESP PANEL BY RT-PCR (FLU A&B, COVID) ARPGX2  LIPASE, BLOOD  TROPONIN I (HIGH SENSITIVITY)  TROPONIN I (HIGH SENSITIVITY)    EKG None  Radiology No results found.  Procedures Procedures (including critical care time)  Medications Ordered in ED Medications  cefTRIAXone (ROCEPHIN) 1 g in sodium chloride 0.9 % 100 mL IVPB (has no administration in time range)  lactated ringers bolus 500 mL (has no administration in time range)  acetaminophen (TYLENOL) tablet 1,000 mg (has no administration in time range)  ondansetron (ZOFRAN) injection 4 mg (has no administration in time range)    ED Course  I have reviewed the triage vital signs and the nursing notes.  Pertinent labs & imaging results that were available during my care of the patient were reviewed by me and considered in my medical decision making (see chart for details).    MDM Rules/Calculators/A&P                          MDM: Courtney Cline is a 86 y.o. female who presents with cough, fever, abdominal pain, and back pain as per above. I have reviewed the nursing documentation for past medical history, family history, and social history. Pertinent previous records reviewed. She is awake, alert.  Tachycardic but otherwise HDS. Afebrile. Physical exam is most notable for lungs clear bilaterally, mild epigastric tenderness, right calf swelling and soreness.  Equal pulses in all 4 extremities.  Patient no acute distress, on home 3 L nasal cannula.  Labs: UA with numerous WBC BCs, many bacteria, large leukocytes, 11-20 RBCs.  CMP unremarkable.  CBC unremarkable  with hemoglobin 9.5 at baseline. EKG: EKG pending at time of handoff. Imaging: CTA PE and CT abdomen/pelvis pending at time of handoff. Consults: none Tx: We will give ceftriaxone, 4 mg IV Zofran, 500 cc bolus LR, 1000 mg p.o. Tylenol ordered  Differential Dx: I am most concerned for CAP versus COPD exacerbation.  I am also concerned for intra-abdominal pathology given abdominal pain in a 36-year-old female.  Most concerned for possible pyelonephritis given patient's back pain associated with UTI.  High pretest probability for PE given patient's unilateral leg swelling, hemoptysis, shortness of breath, chest pain, and the fact that she has been off Eliquis for a week.  Given history, physical exam, and work-up, I do not think she has aortic dissection/aneurysm, ACS, pneumothorax, esophageal pathology, CHF exacerbation, bowel perforation, pancreatitis, PUD, or trauma.  MDM: Courtney Cline is a 86 y.o. female with history of COPD presents with multiple concerns including back pain, abdominal pain, chest tightness, shortness of breath, cough with increased sputum production and purulence, dark urine, and fever. On initial interview, patient's family member at bedside frustrated with long wait time of almost 12 hours in waiting room. Mentioned significant ED patient volume today and apologized for long wait time. Family member frustrated but  polite.  Due to high pretest probability for PE, will obtain CTA PE.  Additionally, patient is high risk for significant morbidity/mortality as she is an 86 year old female with abdominal pain and thus will obtain CT abdomen/pelvis given her nonspecific abdominal tenderness and back pain.  No CVA tenderness and given that she has had 3 weeks of this pain, I have lower concern for pyelonephritis as I would expect that she would be sicker with 3 weeks of Pyelo; however, given concern for UTI/Pyelo and COPD exacerbation/CAP, treated both with 1 g ceftriaxone.  Low  concern for Pseudomonas as patient does not have any risk factors and thus will not treat COPD exacerbation with pseudomonal coverage at this time. Holding on steroids for COPD exacerbation given concern for UTI/pyelo.  Thought process and plan for further work-up including CT imaging discussed with patient and son at bedside.  They voiced understanding.  Medications for symptomatic management ordered.  Transfer of care at 2330 to oncoming ED team, Courtney Lange, PA-C. Assessment and plan of care communicated including following up on EKG, imaging, and reassessing the patient. Patient in stable condition at time of transfer.   The plan for this patient was discussed with Dr. Ashok Cordia, who voiced agreement and who oversaw evaluation and treatment of this patient.   Final Clinical Impression(s) / ED Diagnoses Final diagnoses:  None    Rx / DC Orders ED Discharge Orders    None       Xyon Lukasik, MD 01/20/20 7614    Lajean Saver, MD 01/21/20 1407

## 2020-01-20 NOTE — ED Provider Notes (Signed)
COPD on 3L, A-FIB on Eliquis C/o back pain x 3 weeks - epigastric pain, SOB, dec. Appetite, N, V, fever yesterday of 101, increased cough, blood tinged sputum Here because of cough  Right calf soreness and swelling, "not new" but remarkable on exam +UTI Off Eliquis x 1 week, hemotpysis - pending CTA, abd/pel.  Rocephin 1 gm  Pending imaging, EKG review, tachycardia  Patient has been resting comfortably, with only discomfort lying flat for CT. COVID specimen delayed but is pending.   CTA negative for PE but shows findings concerning for bibasilar PNA vs atx, favor PNA given clinical picture. She has been given Rocephin for UTI. Zithromax added. She is COVID vaccinated.   COVID negative.   Discussed work up at [employer] with son, [name], and patient. She has been hypoxic at home to 88-89% on her 3L with any activity. Now with fever, vomiting, decreased appetite/not eating, hemoptysis on Eliquis and UTI, feel she would benefit from admission to the hospital to improve prior to discharge home.   Discussed with Dr. Hal Hope, [facility], who accepts the patient for admission.    Charlann Lange, PA-C 01/21/20 0981    Lajean Saver, MD 01/21/20 [id number]

## 2020-01-21 ENCOUNTER — Emergency Department (HOSPITAL_COMMUNITY): Payer: Medicare HMO

## 2020-01-21 ENCOUNTER — Inpatient Hospital Stay (HOSPITAL_COMMUNITY): Payer: Medicare HMO

## 2020-01-21 ENCOUNTER — Encounter (HOSPITAL_COMMUNITY): Payer: Self-pay | Admitting: Internal Medicine

## 2020-01-21 DIAGNOSIS — J189 Pneumonia, unspecified organism: Secondary | ICD-10-CM | POA: Diagnosis present

## 2020-01-21 DIAGNOSIS — Z825 Family history of asthma and other chronic lower respiratory diseases: Secondary | ICD-10-CM | POA: Diagnosis not present

## 2020-01-21 DIAGNOSIS — N390 Urinary tract infection, site not specified: Secondary | ICD-10-CM | POA: Diagnosis present

## 2020-01-21 DIAGNOSIS — Z87891 Personal history of nicotine dependence: Secondary | ICD-10-CM | POA: Diagnosis not present

## 2020-01-21 DIAGNOSIS — I4819 Other persistent atrial fibrillation: Secondary | ICD-10-CM | POA: Diagnosis present

## 2020-01-21 DIAGNOSIS — J9611 Chronic respiratory failure with hypoxia: Secondary | ICD-10-CM | POA: Diagnosis present

## 2020-01-21 DIAGNOSIS — R609 Edema, unspecified: Secondary | ICD-10-CM | POA: Diagnosis not present

## 2020-01-21 DIAGNOSIS — E785 Hyperlipidemia, unspecified: Secondary | ICD-10-CM | POA: Diagnosis present

## 2020-01-21 DIAGNOSIS — R059 Cough, unspecified: Secondary | ICD-10-CM | POA: Diagnosis present

## 2020-01-21 DIAGNOSIS — Z853 Personal history of malignant neoplasm of breast: Secondary | ICD-10-CM | POA: Diagnosis not present

## 2020-01-21 DIAGNOSIS — R0902 Hypoxemia: Secondary | ICD-10-CM | POA: Diagnosis not present

## 2020-01-21 DIAGNOSIS — I5032 Chronic diastolic (congestive) heart failure: Secondary | ICD-10-CM | POA: Diagnosis present

## 2020-01-21 DIAGNOSIS — I130 Hypertensive heart and chronic kidney disease with heart failure and stage 1 through stage 4 chronic kidney disease, or unspecified chronic kidney disease: Secondary | ICD-10-CM | POA: Diagnosis present

## 2020-01-21 DIAGNOSIS — N3000 Acute cystitis without hematuria: Secondary | ICD-10-CM | POA: Diagnosis not present

## 2020-01-21 DIAGNOSIS — K219 Gastro-esophageal reflux disease without esophagitis: Secondary | ICD-10-CM | POA: Diagnosis present

## 2020-01-21 DIAGNOSIS — I10 Essential (primary) hypertension: Secondary | ICD-10-CM | POA: Diagnosis not present

## 2020-01-21 DIAGNOSIS — M8580 Other specified disorders of bone density and structure, unspecified site: Secondary | ICD-10-CM | POA: Diagnosis present

## 2020-01-21 DIAGNOSIS — R269 Unspecified abnormalities of gait and mobility: Secondary | ICD-10-CM | POA: Diagnosis not present

## 2020-01-21 DIAGNOSIS — E559 Vitamin D deficiency, unspecified: Secondary | ICD-10-CM | POA: Diagnosis present

## 2020-01-21 DIAGNOSIS — C50211 Malignant neoplasm of upper-inner quadrant of right female breast: Secondary | ICD-10-CM | POA: Diagnosis not present

## 2020-01-21 DIAGNOSIS — M1990 Unspecified osteoarthritis, unspecified site: Secondary | ICD-10-CM | POA: Diagnosis present

## 2020-01-21 DIAGNOSIS — N1832 Chronic kidney disease, stage 3b: Secondary | ICD-10-CM | POA: Diagnosis present

## 2020-01-21 DIAGNOSIS — Z9981 Dependence on supplemental oxygen: Secondary | ICD-10-CM | POA: Diagnosis not present

## 2020-01-21 DIAGNOSIS — R110 Nausea: Secondary | ICD-10-CM | POA: Diagnosis not present

## 2020-01-21 DIAGNOSIS — J441 Chronic obstructive pulmonary disease with (acute) exacerbation: Secondary | ICD-10-CM | POA: Diagnosis present

## 2020-01-21 DIAGNOSIS — K8020 Calculus of gallbladder without cholecystitis without obstruction: Secondary | ICD-10-CM | POA: Diagnosis present

## 2020-01-21 DIAGNOSIS — I708 Atherosclerosis of other arteries: Secondary | ICD-10-CM | POA: Diagnosis present

## 2020-01-21 DIAGNOSIS — M79609 Pain in unspecified limb: Secondary | ICD-10-CM | POA: Diagnosis not present

## 2020-01-21 DIAGNOSIS — J440 Chronic obstructive pulmonary disease with acute lower respiratory infection: Secondary | ICD-10-CM | POA: Diagnosis present

## 2020-01-21 DIAGNOSIS — Z20822 Contact with and (suspected) exposure to covid-19: Secondary | ICD-10-CM | POA: Diagnosis present

## 2020-01-21 DIAGNOSIS — D638 Anemia in other chronic diseases classified elsewhere: Secondary | ICD-10-CM | POA: Diagnosis present

## 2020-01-21 DIAGNOSIS — R1310 Dysphagia, unspecified: Secondary | ICD-10-CM | POA: Diagnosis present

## 2020-01-21 DIAGNOSIS — Z7901 Long term (current) use of anticoagulants: Secondary | ICD-10-CM | POA: Diagnosis not present

## 2020-01-21 DIAGNOSIS — J9610 Chronic respiratory failure, unspecified whether with hypoxia or hypercapnia: Secondary | ICD-10-CM | POA: Diagnosis not present

## 2020-01-21 DIAGNOSIS — J449 Chronic obstructive pulmonary disease, unspecified: Secondary | ICD-10-CM | POA: Diagnosis not present

## 2020-01-21 LAB — COMPREHENSIVE METABOLIC PANEL
ALT: 9 U/L (ref 0–44)
AST: 13 U/L — ABNORMAL LOW (ref 15–41)
Albumin: 2.2 g/dL — ABNORMAL LOW (ref 3.5–5.0)
Alkaline Phosphatase: 62 U/L (ref 38–126)
Anion gap: 14 (ref 5–15)
BUN: 25 mg/dL — ABNORMAL HIGH (ref 8–23)
CO2: 22 mmol/L (ref 22–32)
Calcium: 8.6 mg/dL — ABNORMAL LOW (ref 8.9–10.3)
Chloride: 105 mmol/L (ref 98–111)
Creatinine, Ser: 1.17 mg/dL — ABNORMAL HIGH (ref 0.44–1.00)
GFR, Estimated: 46 mL/min — ABNORMAL LOW (ref >60)
Glucose, Bld: 102 mg/dL — ABNORMAL HIGH (ref 70–99)
Potassium: 3.6 mmol/L (ref 3.5–5.1)
Sodium: 141 mmol/L (ref 135–145)
Total Bilirubin: 0.7 mg/dL (ref 0.3–1.2)
Total Protein: 6 g/dL — ABNORMAL LOW (ref 6.5–8.1)

## 2020-01-21 LAB — TROPONIN I (HIGH SENSITIVITY)
Troponin I (High Sensitivity): 32 ng/L — ABNORMAL HIGH (ref <18)
Troponin I (High Sensitivity): 37 ng/L — ABNORMAL HIGH (ref <18)
Troponin I (High Sensitivity): 37 ng/L — ABNORMAL HIGH (ref <18)

## 2020-01-21 LAB — CBC
HCT: 28.3 % — ABNORMAL LOW (ref 36.0–46.0)
Hemoglobin: 8.5 g/dL — ABNORMAL LOW (ref 12.0–15.0)
MCH: 29.5 pg (ref 26.0–34.0)
MCHC: 30 g/dL (ref 30.0–36.0)
MCV: 98.3 fL (ref 80.0–100.0)
Platelets: 277 10*3/uL (ref 150–400)
RBC: 2.88 MIL/uL — ABNORMAL LOW (ref 3.87–5.11)
RDW: 13.1 % (ref 11.5–15.5)
WBC: 6.5 10*3/uL (ref 4.0–10.5)
nRBC: 0 % (ref 0.0–0.2)

## 2020-01-21 LAB — EXPECTORATED SPUTUM ASSESSMENT W GRAM STAIN, RFLX TO RESP C

## 2020-01-21 LAB — RESP PANEL BY RT-PCR (FLU A&B, COVID) ARPGX2
Influenza A by PCR: NEGATIVE
Influenza B by PCR: NEGATIVE
SARS Coronavirus 2 by RT PCR: NEGATIVE

## 2020-01-21 MED ORDER — IOHEXOL 350 MG/ML SOLN
80.0000 mL | Freq: Once | INTRAVENOUS | Status: AC | PRN
  Administered 2020-01-21: 80 mL via INTRAVENOUS

## 2020-01-21 MED ORDER — SODIUM CHLORIDE 0.9 % IV SOLN
2.0000 g | INTRAVENOUS | Status: DC
  Administered 2020-01-21 – 2020-01-23 (×3): 2 g via INTRAVENOUS
  Filled 2020-01-21 (×2): qty 20
  Filled 2020-01-21: qty 2

## 2020-01-21 MED ORDER — ALBUTEROL SULFATE HFA 108 (90 BASE) MCG/ACT IN AERS
2.0000 | INHALATION_SPRAY | RESPIRATORY_TRACT | Status: DC
  Administered 2020-01-21 – 2020-01-23 (×10): 2 via RESPIRATORY_TRACT
  Filled 2020-01-21: qty 6.7

## 2020-01-21 MED ORDER — SODIUM CHLORIDE 0.9 % IV SOLN: 2.0000 g | INTRAVENOUS | Status: DC

## 2020-01-21 MED ORDER — POLYETHYLENE GLYCOL 3350 17 G PO PACK
17.0000 g | PACK | Freq: Two times a day (BID) | ORAL | Status: DC
  Administered 2020-01-21 – 2020-01-22 (×4): 17 g via ORAL
  Filled 2020-01-21 (×4): qty 1

## 2020-01-21 MED ORDER — TECHNETIUM TC 99M MEBROFENIN IV KIT
5.3000 | PACK | Freq: Once | INTRAVENOUS | Status: AC | PRN
  Administered 2020-01-21: 5.3 via INTRAVENOUS

## 2020-01-21 MED ORDER — ALBUTEROL SULFATE HFA 108 (90 BASE) MCG/ACT IN AERS
8.0000 | INHALATION_SPRAY | Freq: Once | RESPIRATORY_TRACT | Status: AC
  Administered 2020-01-21: 8 via RESPIRATORY_TRACT
  Filled 2020-01-21: qty 6.7

## 2020-01-21 MED ORDER — OXYMETAZOLINE HCL 0.05 % NA SOLN
1.0000 | Freq: Once | NASAL | Status: AC
  Administered 2020-01-21: 1 via NASAL
  Filled 2020-01-21: qty 30

## 2020-01-21 MED ORDER — LISINOPRIL 40 MG PO TABS
40.0000 mg | ORAL_TABLET | Freq: Every day | ORAL | Status: DC
  Administered 2020-01-21 – 2020-01-23 (×3): 40 mg via ORAL
  Filled 2020-01-21 (×2): qty 1
  Filled 2020-01-21: qty 2

## 2020-01-21 MED ORDER — FUROSEMIDE 40 MG PO TABS
40.0000 mg | ORAL_TABLET | Freq: Every day | ORAL | Status: DC
  Administered 2020-01-21 – 2020-01-23 (×3): 40 mg via ORAL
  Filled 2020-01-21: qty 1
  Filled 2020-01-21: qty 2
  Filled 2020-01-21: qty 1

## 2020-01-21 MED ORDER — PANTOPRAZOLE SODIUM 40 MG PO TBEC
40.0000 mg | DELAYED_RELEASE_TABLET | Freq: Every day | ORAL | Status: DC
  Administered 2020-01-21 – 2020-01-23 (×3): 40 mg via ORAL
  Filled 2020-01-21 (×3): qty 1

## 2020-01-21 MED ORDER — SODIUM CHLORIDE 0.9 % IV SOLN
500.0000 mg | Freq: Every day | INTRAVENOUS | Status: DC
  Administered 2020-01-21 – 2020-01-22 (×2): 500 mg via INTRAVENOUS
  Filled 2020-01-21 (×3): qty 500

## 2020-01-21 MED ORDER — POTASSIUM CHLORIDE CRYS ER 20 MEQ PO TBCR
20.0000 meq | EXTENDED_RELEASE_TABLET | Freq: Every day | ORAL | Status: DC
  Administered 2020-01-21 – 2020-01-23 (×3): 20 meq via ORAL
  Filled 2020-01-21 (×4): qty 1

## 2020-01-21 MED ORDER — SODIUM CHLORIDE 0.9 % IV SOLN: 500.0000 mg | INTRAVENOUS | Status: DC

## 2020-01-21 MED ORDER — PRAVASTATIN SODIUM 40 MG PO TABS
40.0000 mg | ORAL_TABLET | Freq: Every day | ORAL | Status: DC
  Administered 2020-01-21 – 2020-01-22 (×2): 40 mg via ORAL
  Filled 2020-01-21 (×2): qty 1

## 2020-01-21 MED ORDER — APIXABAN 5 MG PO TABS
5.0000 mg | ORAL_TABLET | Freq: Two times a day (BID) | ORAL | Status: DC
  Administered 2020-01-21 – 2020-01-23 (×5): 5 mg via ORAL
  Filled 2020-01-21 (×5): qty 1

## 2020-01-21 MED ORDER — UMECLIDINIUM BROMIDE 62.5 MCG/INH IN AEPB
1.0000 | INHALATION_SPRAY | Freq: Every day | RESPIRATORY_TRACT | Status: DC
  Administered 2020-01-21 – 2020-01-23 (×3): 1 via RESPIRATORY_TRACT
  Filled 2020-01-21: qty 7

## 2020-01-21 MED ORDER — FLUTICASONE-UMECLIDIN-VILANT 100-62.5-25 MCG/INH IN AEPB: 1.0000 | INHALATION_SPRAY | Freq: Every day | RESPIRATORY_TRACT | Status: DC

## 2020-01-21 MED ORDER — FLUTICASONE FUROATE-VILANTEROL 100-25 MCG/INH IN AEPB
1.0000 | INHALATION_SPRAY | Freq: Every day | RESPIRATORY_TRACT | Status: DC
  Administered 2020-01-21 – 2020-01-23 (×3): 1 via RESPIRATORY_TRACT
  Filled 2020-01-21: qty 28

## 2020-01-21 MED ORDER — SODIUM CHLORIDE 0.9 % IV SOLN
500.0000 mg | Freq: Once | INTRAVENOUS | Status: AC
  Administered 2020-01-21: 500 mg via INTRAVENOUS
  Filled 2020-01-21 (×2): qty 500

## 2020-01-21 MED ORDER — METOPROLOL TARTRATE 25 MG PO TABS
25.0000 mg | ORAL_TABLET | Freq: Two times a day (BID) | ORAL | Status: DC
  Administered 2020-01-21 – 2020-01-23 (×4): 25 mg via ORAL
  Filled 2020-01-21 (×5): qty 1

## 2020-01-21 NOTE — ED Notes (Signed)
Patient off to CT, will obtain VS when patient returns.

## 2020-01-21 NOTE — ED Notes (Signed)
Patient transported to [facility] 

## 2020-01-21 NOTE — ED Notes (Signed)
Pt still in Nuclear Med.

## 2020-01-21 NOTE — ED Notes (Signed)
O2 tank replaced, pt remains at her baseline 3L via n.c, resp e/u, nad.

## 2020-01-21 NOTE — Progress Notes (Signed)
Patient seen and examined.  Admitted early morning hours by nighttime hospitalist with poor appetite, diffuse upper abdominal pain, fatigue and weakness.  She is already on 3 L oxygen at home.  CT scan of the chest showed basal atelectasis.  UA was abnormal however patient is not symptomatic with her urinary complaints.  She has gallstone with normal biliary function.  HIDA scan is underway.  Patient seen in the emergency room waiting for inpatient bed assignment.  Her son was at the bedside.  Patient was also worried about not going to bathroom for the last 7 days.  Plan: Continue Rocephin and azithromycin to treat for UTI.  HIDA scan is pending, however clinically there is no evidence of cholecystitis. Mobilize Aggressive bowel regimen with MiraLAX

## 2020-01-21 NOTE — ED Notes (Signed)
Lunch Tray Ordered @ 1036. 

## 2020-01-21 NOTE — Evaluation (Signed)
Physical Therapy Evaluation Patient Details Name: Courtney Cline MRN: [id number] DOB: February 02, 1934 Today's Date: 01/21/2020   History of Present Illness  Pt is an 86 y/o female admitted secondary to CAP and UTI. PMH includes HTN, breast CA, dCHF, COPD on 3L of O2, and a fib.   Clinical Impression  Pt admitted secondary to problem above with deficits below. Pt requiring min A to stand and ambulate short distance. Pt limited in ambulation tolerance secondary to fatigue. Anticipate pt will progress well once feeling better. Will continue to follow acutely.     Follow Up Recommendations Home health PT;Supervision/Assistance - 24 hour (24/7 initially )    Equipment Recommendations  3in1 (PT)    Recommendations for Other Services OT consult     Precautions / Restrictions Precautions Precautions: Fall Restrictions Weight Bearing Restrictions: No      Mobility  Bed Mobility Overal bed mobility: Needs Assistance Bed Mobility: Supine to Sit;Sit to Supine     Supine to sit: Min guard Sit to supine: Min guard   General bed mobility comments: Min guard for safety throughout bed mobility.     Transfers Overall transfer level: Needs assistance Equipment used: 2 person hand held assist Transfers: Sit to/from Stand Sit to Stand: Min assist         General transfer comment: Min A for lift assist and steadying. Pt's son standing on other side of pt and holding pt's hand for support.   Ambulation/Gait Ambulation/Gait assistance: Min assist;+2 safety/equipment Gait Distance (Feet): 15 Feet Assistive device: 2 person hand held assist Gait Pattern/deviations: Step-through pattern;Decreased stride length Gait velocity: Decreased   General Gait Details: Very guarded gait. Pt with short steps and fatiguing easily, which limited ambulation tolerance. Pt's son standing on one side and PT on the other for support. Educated about using RW at home.   Stairs             Wheelchair Mobility    Modified Rankin (Stroke Patients Only)       Balance Overall balance assessment: Needs assistance Sitting-balance support: No upper extremity supported;Feet supported Sitting balance-Leahy Scale: Fair     Standing balance support: Bilateral upper extremity supported;During functional activity Standing balance-Leahy Scale: Poor Standing balance comment: Reliant on BUE support                              Pertinent Vitals/Pain Pain Assessment: No/denies pain    Home Living Family/patient expects to be discharged to:: Private residence Living Arrangements: Spouse/significant other Available Help at Discharge: Family Type of Home: House Home Access: Stairs to enter Entrance Stairs-Rails: Psychiatric nurse of Steps: 4 Home Layout: One level Home Equipment: Environmental consultant - 2 wheels;Cane - single point Additional Comments: on 3L of oxygen     Prior Function Level of Independence: Independent               Hand Dominance        Extremity/Trunk Assessment   Upper Extremity Assessment Upper Extremity Assessment: Defer to OT evaluation    Lower Extremity Assessment Lower Extremity Assessment: Generalized weakness    Cervical / Trunk Assessment Cervical / Trunk Assessment: Normal  Communication   Communication: No difficulties  Cognition Arousal/Alertness: Awake/alert Behavior During Therapy: WFL for tasks assessed/performed Overall Cognitive Status: Within Functional Limits for tasks assessed  General Comments General comments (skin integrity, edema, etc.): Pt's son present during session.     Exercises     Assessment/Plan    PT Assessment Patient needs continued PT services  PT Problem List Decreased strength;Decreased activity tolerance;Decreased balance;Decreased mobility;Decreased knowledge of use of DME;Decreased knowledge of precautions        PT Treatment Interventions Gait training;DME instruction;Stair training;Therapeutic activities;Functional mobility training;Therapeutic exercise;Balance training;Patient/family education    PT Goals (Current goals can be found in the Care Plan section)  Acute Rehab PT Goals Patient Stated Goal: to feel better PT Goal Formulation: With patient Time For Goal Achievement: 02/04/20 Potential to Achieve Goals: Good    Frequency Min 3X/week   Barriers to discharge        Co-evaluation               AM-PAC PT "6 Clicks" Mobility  Outcome Measure Help needed turning from your back to your side while in a flat bed without using bedrails?: A Little Help needed moving from lying on your back to sitting on the side of a flat bed without using bedrails?: A Little Help needed moving to and from a bed to a chair (including a wheelchair)?: A Little Help needed standing up from a chair using your arms (e.g., wheelchair or bedside chair)?: A Little Help needed to walk in hospital room?: A Little Help needed climbing 3-5 steps with a railing? : A Lot 6 Click Score: 17    End of Session Equipment Utilized During Treatment: Oxygen Activity Tolerance: Patient limited by fatigue Patient left: in bed;with call bell/phone within reach;with family/visitor present (on stretcher in ED ) Nurse Communication: Mobility status PT Visit Diagnosis: Unsteadiness on feet (R26.81);Muscle weakness (generalized) (M62.81);Difficulty in walking, not elsewhere classified (R26.2)    Time: 4315-4008 PT Time Calculation (min) (ACUTE ONLY): 15 min   Charges:   PT Evaluation $PT Eval Moderate Complexity: 1 Mod          Reuel Derby, PT, DPT  Acute Rehabilitation Services  Pager: [phone] Office: [phone]   Rudean Hitt 01/21/2020, 11:33 AM

## 2020-01-21 NOTE — H&P (Signed)
History and Physical    Courtney Cline:[id number] DOB: April 25, 1933 DOA: 01/20/2020  PCP: Leeroy Cha, MD  Patient coming from: Home.  Chief Complaint: Productive cough and upper back pain.  Nausea.  HPI: Courtney Cline is a 86 y.o. female with history of persistent A. fib, diastolic CHF, chronic kidney disease stage III, COPD on home oxygen 3 L presents to the ER because of persistent productive cough which was discolored for the last few days with upper back pain.  Also has been examined nausea poor appetite unable to take her medications for last 3 days.  Denies any diarrhea.  Has diffuse abdominal discomfort.  Noticed increasing swelling in the lower extremity mostly in the right side.  ED Course: In the ER patient was afebrile and CT angiogram of the chest abdomen pelvis shows features concerning for multifocal pneumonia and also gallbladder stones.  Labs are largely at baseline high-sensitivity opponent was 37 and 37.  Covid test was negative.  EKG shows normal sinus rhythm.  UA concerning for UTI.  Given the multiple comorbidities and patient having nausea unable to take oral medication admitted for observation with IV antibiotics for pneumonia and UTI.  Review of Systems: As per HPI, rest all negative.   Past Medical History:  Diagnosis Date  . Allergic rhinitis   . Arthritis   . Breast calcification seen on mammogram    Right breast  . Breast cancer ([place])   . COPD (chronic obstructive pulmonary disease) ([place])   . GERD (gastroesophageal reflux disease)   . Hyperglycemia   . Hyperlipidemia   . Hypertension   . Low back pain   . On home oxygen therapy    all the time  . Osteopenia   . Peripheral edema   . Shortness of breath   . Subclavian artery stenosis, left ([place])   . Vitamin D deficiency   . Wears dentures    top  . Wears glasses    reading    Past Surgical History:  Procedure Laterality Date  . BREAST BIOPSY    . BREAST LUMPECTOMY      right 2015  . BREAST LUMPECTOMY WITH RADIOACTIVE SEED LOCALIZATION Right 12/20/2013   Procedure: RIGHT BREAST LUMPECTOMY WITH RADIOACTIVE SEED LOCALIZATION;  Surgeon: Erroll Luna, MD;  Location: [place];  Service: General;  Laterality: Right;  . CAROTID DUPLEX  2014  . CATARACT EXTRACTION     both eyes     reports that she quit smoking about 25 years ago. Her smoking use included cigarettes. She has a 40.00 pack-year smoking history. She has never used smokeless tobacco. She reports that she does not drink alcohol and does not use drugs.  No Known Allergies  Family History  Problem Relation Age of Onset  . Emphysema Mother   . Emphysema Maternal Grandfather     Prior to Admission medications   Medication Sig Start Date End Date Taking? Authorizing Provider  acetaminophen (TYLENOL) 325 MG tablet Take 650 mg by mouth every 6 (six) hours as needed.    [provider]  apixaban (ELIQUIS) 5 MG TABS tablet Take 1 tablet (5 mg total) by mouth 2 (two) times daily. 07/17/19   Julian Hy, DO  azelastine (ASTELIN) 0.1 % nasal spray Place 2 sprays into both nostrils 2 (two) times daily. Use in each nostril as directed 07/17/19   Julian Hy, DO  Cholecalciferol (VITAMIN D) 2000 UNITS CAPS Take 1 capsule by mouth daily.  [provider]  Cyanocobalamin (VITAMIN B 12 PO) Take 1 tablet by mouth daily.    [provider]  DiphenhydrAMINE HCl (BENADRYL PO) Take 1 tablet by mouth as needed.    [provider]  famotidine (PEPCID) 20 MG tablet 1 TABLET AT BEDTIME AS NEEDED EVERY OTHER DAY, WITH BREAKFAST ORALLY 90 DAYS 08/21/18   [provider]  Fluticasone-Umeclidin-Vilant (TRELEGY ELLIPTA) 100-62.5-25 MCG/INH AEPB INHALE 1 PUFF BY MOUTH EVERY DAY 07/17/19   Julian Hy, DO  Fluticasone-Umeclidin-Vilant (TRELEGY ELLIPTA) 100-62.5-25 MCG/INH AEPB Inhale 1 puff into the lungs daily. 10/23/19   Julian Hy, DO  furosemide (LASIX) 40  MG tablet Take 1 tablet (40 mg total) by mouth 2 (two) times daily. <PLEASE MAKE APPOINTMENT FOR REFILLS> Patient taking differently: Take 40 mg by mouth daily. <PLEASE MAKE APPOINTMENT FOR REFILLS> 10/27/16   Lorretta Harp, MD  KLOR-CON M20 20 MEQ tablet TAKE 1 TABLET BY MOUTH DAILY 09/14/15   Lorretta Harp, MD  lisinopril (PRINIVIL,ZESTRIL) 40 MG tablet Take 40 mg by mouth daily.    [provider]  lovastatin (MEVACOR) 40 MG tablet Take 40 mg by mouth at bedtime.    [provider]  metoprolol tartrate (LOPRESSOR) 25 MG tablet Take 1 tablet (25 mg total) by mouth 2 (two) times daily. 07/17/19   Julian Hy, DO  nystatin (MYCOSTATIN/NYSTOP) powder Apply topically 3 (three) times daily. 12/24/18   Alla Feeling, NP  omeprazole (PRILOSEC) 20 MG capsule Take 20 mg by mouth daily.    [provider]  PROAIR HFA 108 (90 Base) MCG/ACT inhaler TAKE 2 PUFFS BY MOUTH EVERY 6 HOURS AS NEEDED FOR WHEEZE OR SHORTNESS OF BREATH 08/16/17   Juanito Doom, MD    Physical Exam: Constitutional: Moderately built and nourished. Vitals:   01/20/20 1447 01/20/20 2155 01/21/20 0132 01/21/20 0444  BP: 114/75 (!) 143/54 97/62 (!) 122/43  Pulse: (!) 50 (!) 110 97 99  Resp: 16 18 16 16   Temp:   98 F (36.7 C)   TempSrc:   Oral   SpO2: 100% 96% 98% 97%   Eyes: Anicteric no pallor. ENMT: No discharge from the ears eyes nose or mouth. Neck: No mass felt.  No neck rigidity. Respiratory: No rhonchi or crepitations. Cardiovascular: S1-S2 heard. Abdomen: Soft diffuse tenderness no guarding or rigidity. Musculoskeletal: Bilateral lower extremity edema more on the right side. Skin: No rash. Neurologic: Alert awake oriented to time place and person.  Moves all extremities. Psychiatric: Appears normal.  Normal affect.   Labs on Admission: I have personally reviewed following labs and imaging studies  CBC: Recent Labs  Lab 01/20/20 1032  WBC 7.5  HGB 9.5*  HCT 31.5*  MCV  96.9  PLT 616   Basic Metabolic Panel: Recent Labs  Lab 01/20/20 1032  NA 137  K 3.5  CL 101  CO2 24  GLUCOSE 107*  BUN 21  CREATININE 1.25*  CALCIUM 9.2   GFR: CrCl cannot be calculated (Unknown ideal weight.). Liver Function Tests: Recent Labs  Lab 01/20/20 1032  AST 14*  ALT 9  ALKPHOS 66  BILITOT 0.8  PROT 6.7  ALBUMIN 2.6*   Recent Labs  Lab 01/20/20 1032  LIPASE 21   No results for input(s): AMMONIA in the last 168 hours. Coagulation Profile: No results for input(s): INR, PROTIME in the last 168 hours. Cardiac Enzymes: No results for input(s): CKTOTAL, CKMB, CKMBINDEX, TROPONINI in the last 168 hours. BNP (last 3  results) No results for input(s): PROBNP in the last 8760 hours. HbA1C: No results for input(s): HGBA1C in the last 72 hours. CBG: No results for input(s): GLUCAP in the last 168 hours. Lipid Profile: No results for input(s): CHOL, HDL, LDLCALC, TRIG, CHOLHDL, LDLDIRECT in the last 72 hours. Thyroid Function Tests: No results for input(s): TSH, T4TOTAL, FREET4, T3FREE, THYROIDAB in the last 72 hours. Anemia Panel: No results for input(s): VITAMINB12, FOLATE, FERRITIN, TIBC, IRON, RETICCTPCT in the last 72 hours. Urine analysis:    Component Value Date/Time   COLORURINE AMBER (A) 01/20/2020 1746   APPEARANCEUR CLOUDY (A) 01/20/2020 1746   LABSPEC 1.020 01/20/2020 1746   PHURINE 5.0 01/20/2020 1746   GLUCOSEU NEGATIVE 01/20/2020 1746   HGBUR MODERATE (A) 01/20/2020 1746   BILIRUBINUR NEGATIVE 01/20/2020 1746   KETONESUR 5 (A) 01/20/2020 1746   PROTEINUR 30 (A) 01/20/2020 1746   NITRITE NEGATIVE 01/20/2020 1746   LEUKOCYTESUR LARGE (A) 01/20/2020 1746   Sepsis Labs: @LABRCNTIP (procalcitonin:4,lacticidven:4) ) Recent Results (from the past 240 hour(s))  Resp Panel by RT-PCR (Flu A&B, Covid) Nasopharyngeal Swab     Status: None   Collection Time: 01/21/20  2:40 AM   Specimen: Nasopharyngeal Swab; Nasopharyngeal(NP) swabs in vial  transport medium  Result Value Ref Range Status   SARS Coronavirus 2 by RT PCR NEGATIVE NEGATIVE Final    Comment: (NOTE) SARS-CoV-2 target nucleic acids are NOT DETECTED.  The SARS-CoV-2 RNA is generally detectable in upper respiratory specimens during the acute phase of infection. The lowest concentration of SARS-CoV-2 viral copies this assay can detect is 138 copies/mL. A negative result does not preclude SARS-Cov-2 infection and should not be used as the sole basis for treatment or other patient management decisions. A negative result may occur with  improper specimen collection/handling, submission of specimen other than nasopharyngeal swab, presence of viral mutation(s) within the areas targeted by this assay, and inadequate number of viral copies(<138 copies/mL). A negative result must be combined with clinical observations, patient history, and epidemiological information. The expected result is Negative.  Fact Sheet for Patients:  EntrepreneurPulse.com.au  Fact Sheet for Healthcare Providers:  IncredibleEmployment.be  This test is no t yet approved or cleared by the [place] FDA and  has been authorized for detection and/or diagnosis of SARS-CoV-2 by FDA under an Emergency Use Authorization (EUA). This EUA will remain  in effect (meaning this test can be used) for the duration of the COVID-19 declaration under Section 564(b)(1) of the Act, 21 U.S.C.section 360bbb-3(b)(1), unless the authorization is terminated  or revoked sooner.       Influenza A by PCR NEGATIVE NEGATIVE Final   Influenza B by PCR NEGATIVE NEGATIVE Final    Comment: (NOTE) The Xpert Xpress SARS-CoV-2/FLU/RSV plus assay is intended as an aid in the diagnosis of influenza from Nasopharyngeal swab specimens and should not be used as a sole basis for treatment. Nasal washings and aspirates are unacceptable for Xpert Xpress SARS-CoV-2/FLU/RSV testing.  Fact  Sheet for Patients: EntrepreneurPulse.com.au  Fact Sheet for Healthcare Providers: IncredibleEmployment.be  This test is not yet approved or cleared by the [place] FDA and has been authorized for detection and/or diagnosis of SARS-CoV-2 by FDA under an Emergency Use Authorization (EUA). This EUA will remain in effect (meaning this test can be used) for the duration of the COVID-19 declaration under Section 564(b)(1) of the Act, 21 U.S.C. section 360bbb-3(b)(1), unless the authorization is terminated or revoked.  Performed at [facility], [place] [address]  [place], [place] [address]      Radiological Exams on Admission: CT Angio Chest PE W and/or Wo Contrast  Result Date: 01/21/2020 CLINICAL DATA:  Abdominal pain and cough EXAM: CT ANGIOGRAPHY CHEST WITH CONTRAST TECHNIQUE: Multidetector CT imaging of the chest was performed using the standard protocol during bolus administration of intravenous contrast. Multiplanar CT image reconstructions and MIPs were obtained to evaluate the vascular anatomy. CONTRAST:  44mL OMNIPAQUE IOHEXOL 350 MG/ML SOLN COMPARISON:  None. FINDINGS: Cardiovascular: There is a optimal opacification of the pulmonary arteries. There is no central,segmental, or subsegmental filling defects within the pulmonary arteries. The heart is normal in size. No pericardial effusion or thickening. No evidence right heart strain. There is normal three-vessel brachiocephalic anatomy without proximal stenosis. Aortic and mitral valve calcifications are seen. Coronary artery and aortic atherosclerosis is seen. Mediastinum/Nodes: No hilar, mediastinal, or axillary adenopathy. Thyroid gland, trachea, and esophagus demonstrate no significant findings. Lungs/Pleura: Small bilateral pleural effusions are present, right greater than left. There is patchy airspace consolidation with air bronchogram seen at the posterior lung bases, left greater than  right. Centrilobular emphysematous changes seen predominantly at both upper lungs. Upper Abdomen: No acute abnormalities present in the visualized portions of the upper abdomen. Musculoskeletal: No chest wall abnormality. No acute or significant osseous findings. Review of the MIP images confirms the above findings. Abdomen/pelvis: Hepatobiliary: The liver is normal in density without focal abnormality.The main portal vein is patent. Large calcified gallstones are present. Pancreas: Unremarkable. No pancreatic ductal dilatation or surrounding inflammatory changes. Spleen: Normal in size without focal abnormality. Adrenals/Urinary Tract: Both adrenal glands appear normal. The kidneys and collecting system appear normal without evidence of urinary tract calculus or hydronephrosis. Bladder is unremarkable. Stomach/Bowel: The stomach, small bowel, and colon are normal in appearance. No inflammatory changes, wall thickening, or obstructive findings. Extensive colonic diverticulosis is seen. Vascular/Lymphatic: There are no enlarged mesenteric, retroperitoneal, or pelvic lymph nodes. Scattered aortic atherosclerotic calcifications are seen without aneurysmal dilatation. Reproductive: The uterus and adnexa are unremarkable. Other: No evidence of abdominal wall mass or hernia. Musculoskeletal: No acute or significant osseous findings. IMPRESSION: No central, segmental, or subsegmental pulmonary embolism. Multifocal patchy airspace opacities seen at both posterior lung bases which could be due to atelectasis or infectious etiology. Small bilateral pleural effusions. Diverticulosis without diverticulitis. Cholelithiasis Aortic Atherosclerosis (ICD10-I70.0). Electronically Signed   By: Prudencio Pair M.D.   On: 01/21/2020 01:50   CT ABDOMEN PELVIS W CONTRAST  Result Date: 01/21/2020 CLINICAL DATA:  Abdominal pain and cough EXAM: CT ANGIOGRAPHY CHEST WITH CONTRAST TECHNIQUE: Multidetector CT imaging of the chest was performed  using the standard protocol during bolus administration of intravenous contrast. Multiplanar CT image reconstructions and MIPs were obtained to evaluate the vascular anatomy. CONTRAST:  26mL OMNIPAQUE IOHEXOL 350 MG/ML SOLN COMPARISON:  None. FINDINGS: Cardiovascular: There is a optimal opacification of the pulmonary arteries. There is no central,segmental, or subsegmental filling defects within the pulmonary arteries. The heart is normal in size. No pericardial effusion or thickening. No evidence right heart strain. There is normal three-vessel brachiocephalic anatomy without proximal stenosis. Aortic and mitral valve calcifications are seen. Coronary artery and aortic atherosclerosis is seen. Mediastinum/Nodes: No hilar, mediastinal, or axillary adenopathy. Thyroid gland, trachea, and esophagus demonstrate no significant findings. Lungs/Pleura: Small bilateral pleural effusions are present, right greater than left. There is patchy airspace consolidation with air bronchogram seen at the posterior lung bases, left greater than right. Centrilobular emphysematous changes seen predominantly at both upper lungs. Upper Abdomen: No  acute abnormalities present in the visualized portions of the upper abdomen. Musculoskeletal: No chest wall abnormality. No acute or significant osseous findings. Review of the MIP images confirms the above findings. Abdomen/pelvis: Hepatobiliary: The liver is normal in density without focal abnormality.The main portal vein is patent. Large calcified gallstones are present. Pancreas: Unremarkable. No pancreatic ductal dilatation or surrounding inflammatory changes. Spleen: Normal in size without focal abnormality. Adrenals/Urinary Tract: Both adrenal glands appear normal. The kidneys and collecting system appear normal without evidence of urinary tract calculus or hydronephrosis. Bladder is unremarkable. Stomach/Bowel: The stomach, small bowel, and colon are normal in appearance. No  inflammatory changes, wall thickening, or obstructive findings. Extensive colonic diverticulosis is seen. Vascular/Lymphatic: There are no enlarged mesenteric, retroperitoneal, or pelvic lymph nodes. Scattered aortic atherosclerotic calcifications are seen without aneurysmal dilatation. Reproductive: The uterus and adnexa are unremarkable. Other: No evidence of abdominal wall mass or hernia. Musculoskeletal: No acute or significant osseous findings. IMPRESSION: No central, segmental, or subsegmental pulmonary embolism. Multifocal patchy airspace opacities seen at both posterior lung bases which could be due to atelectasis or infectious etiology. Small bilateral pleural effusions. Diverticulosis without diverticulitis. Cholelithiasis Aortic Atherosclerosis (ICD10-I70.0). Electronically Signed   By: Prudencio Pair M.D.   On: 01/21/2020 01:50    EKG: Independently reviewed.  Normal sinus rhythm.  Assessment/Plan Principal Problem:   CAP (community acquired pneumonia) Active Problems:   Chronic hypoxemic respiratory failure (HCC)   Breast cancer of upper-inner quadrant of right female breast ([place])   Subclavian artery stenosis, left ([facility])   Essential hypertension   Anemia of chronic disease   Diastolic CHF ([place])   COPD without exacerbation (HCC)   Persistent atrial fibrillation (HCC)   Nausea    1. Community-acquired pneumonia for which patient is on empiric antibiotics check sputum cultures urine for Legionella and strep antigen. 2. UTI on empiric antibiotics likely causing nausea. 3. Abdominal discomfort with nausea has not taken her medication for last 3 days.  Given that patient has gallstones will check HIDA scan.  Abdominal discomfort and nausea could also be from UTI. 4. Persistent A. fib presently rate controlled will restart apixaban and metoprolol. 5. Hypertension on metoprolol and lisinopril. 6. Chronic diastolic CHF on diuretics. 7. Bilateral lower extremity edema more on the right  side recently noticed.  Will check Dopplers.  Patient is already on apixaban but did miss a dose for last 3 days. 8. History of breast cancer being followed by oncologist. 9. COPD on chronic home oxygen therapy baseline of 3 L. 10. Anemia follow CBC. 11. History of subclavian stenosis.   DVT prophylaxis: Apixaban. Code Status: Full code. Family Communication: Patient's son. Disposition Plan: Home. Consults called: None. Admission status: Observation.   Rise Patience MD [facility] Pager [phone].  If 7PM-7AM, please contact night-coverage [email or web address] Password [facility]  01/21/2020, 5:44 AM

## 2020-01-21 NOTE — Progress Notes (Signed)
Attempted lower extremity venous duplex, however patient is currently in nuclear medicine. Will attempt again as schedule permits.  01/21/2020 2:21 PM Kelby Aline., MHA, RVT, RDCS, RDMS

## 2020-01-21 NOTE — ED Notes (Signed)
Pt was informed Nuclear med called this morning & stated she would be brought to their dept. between 12pm & 1pm & that she needed to remain NPO for the scan, son at bedside.

## 2020-01-22 ENCOUNTER — Other Ambulatory Visit: Payer: Self-pay

## 2020-01-22 ENCOUNTER — Inpatient Hospital Stay (HOSPITAL_COMMUNITY): Payer: Medicare HMO

## 2020-01-22 DIAGNOSIS — I4819 Other persistent atrial fibrillation: Secondary | ICD-10-CM

## 2020-01-22 DIAGNOSIS — R609 Edema, unspecified: Secondary | ICD-10-CM

## 2020-01-22 DIAGNOSIS — R110 Nausea: Secondary | ICD-10-CM

## 2020-01-22 DIAGNOSIS — D638 Anemia in other chronic diseases classified elsewhere: Secondary | ICD-10-CM

## 2020-01-22 DIAGNOSIS — M79609 Pain in unspecified limb: Secondary | ICD-10-CM | POA: Diagnosis not present

## 2020-01-22 DIAGNOSIS — C50211 Malignant neoplasm of upper-inner quadrant of right female breast: Secondary | ICD-10-CM

## 2020-01-22 DIAGNOSIS — I5032 Chronic diastolic (congestive) heart failure: Secondary | ICD-10-CM

## 2020-01-22 DIAGNOSIS — I10 Essential (primary) hypertension: Secondary | ICD-10-CM

## 2020-01-22 DIAGNOSIS — Z170 Estrogen receptor positive status [ER+]: Secondary | ICD-10-CM

## 2020-01-22 LAB — CBC
HCT: 27.7 % — ABNORMAL LOW (ref 36.0–46.0)
Hemoglobin: 8.9 g/dL — ABNORMAL LOW (ref 12.0–15.0)
MCH: 30.6 pg (ref 26.0–34.0)
MCHC: 32.1 g/dL (ref 30.0–36.0)
MCV: 95.2 fL (ref 80.0–100.0)
Platelets: 311 10*3/uL (ref 150–400)
RBC: 2.91 MIL/uL — ABNORMAL LOW (ref 3.87–5.11)
RDW: 13.2 % (ref 11.5–15.5)
WBC: 6.5 10*3/uL (ref 4.0–10.5)
nRBC: 0 % (ref 0.0–0.2)

## 2020-01-22 LAB — URINE CULTURE

## 2020-01-22 LAB — BASIC METABOLIC PANEL
Anion gap: 12 (ref 5–15)
BUN: 22 mg/dL (ref 8–23)
CO2: 26 mmol/L (ref 22–32)
Calcium: 8.8 mg/dL — ABNORMAL LOW (ref 8.9–10.3)
Chloride: 104 mmol/L (ref 98–111)
Creatinine, Ser: 1.19 mg/dL — ABNORMAL HIGH (ref 0.44–1.00)
GFR, Estimated: 45 mL/min — ABNORMAL LOW (ref >60)
Glucose, Bld: 88 mg/dL (ref 70–99)
Potassium: 3.8 mmol/L (ref 3.5–5.1)
Sodium: 142 mmol/L (ref 135–145)

## 2020-01-22 MED ORDER — SODIUM CHLORIDE 0.9 % IV SOLN: INTRAVENOUS | Status: DC

## 2020-01-22 NOTE — Evaluation (Signed)
Occupational Therapy Evaluation Patient Details Name: Courtney Cline MRN: [id number] DOB: April 01, 1933 Today's Date: 01/22/2020    History of Present Illness Pt is an 86 y/o female admitted secondary to CAP and UTI. PMH includes HTN, breast CA, dCHF, COPD on 3L of O2, and a fib.    Clinical Impression   Pt admitted with the above diagnoses and presents with below problem list. Pt will benefit from continued acute OT to address the below listed deficits and maximize independence with basic ADLs prior to d/c home. At baseline, pt is independent with ADLs. Pt is currently min guard assist with LB ADLs and functional transfers. Pt able to tolerate only short distances ambulating (aobut 6' utilizing rw) this session d/t DOE 2/4. She does endorse feeling a bit better today than yesterday. Son present throughout session.      Follow Up Recommendations  Home health OT;Supervision - Intermittent (OOB/mobility)    Equipment Recommendations  None recommended by OT    Recommendations for Other Services       Precautions / Restrictions Precautions Precautions: Fall Restrictions Weight Bearing Restrictions: No    Mobility Bed Mobility Overal bed mobility: Needs Assistance Bed Mobility: Supine to Sit     Supine to sit: Min guard     General bed mobility comments: min guard for safety    Transfers Overall transfer level: Needs assistance Equipment used: Rolling walker (2 wheeled) Transfers: Sit to/from Stand Sit to Stand: Min guard         General transfer comment: min guard assist utilizing rw. Pt seeks external support in standing.     Balance Overall balance assessment: Needs assistance Sitting-balance support: No upper extremity supported;Feet supported Sitting balance-Leahy Scale: Fair     Standing balance support: Bilateral upper extremity supported;During functional activity Standing balance-Leahy Scale: Poor Standing balance comment: Reliant on BUE support                             ADL either performed or assessed with clinical judgement   ADL Overall ADL's : Needs assistance/impaired Eating/Feeding: Set up;Sitting   Grooming: Set up;Sitting   Upper Body Bathing: Set up;Sitting   Lower Body Bathing: Min guard;Sit to/from stand   Upper Body Dressing : Set up;Sitting   Lower Body Dressing: Min guard;Sit to/from stand   Toilet Transfer: Min guard;Stand-pivot;BSC;RW   Toileting- Water quality scientist and Hygiene: Min guard;Set up;Sitting/lateral lean;Sit to/from stand     Tub/Shower Transfer Details (indicate cue type and reason): n/a sponge bathes at home d/t tub shower Functional mobility during ADLs: Min guard;Rolling walker General ADL Comments: Pt completed bed mobility, sat EOB several minutes. Pt ambulated from EOB to sink (6') utilizing rw. DOE 2/4 with minimal activity. Pt sitting EOB with son present at end of session.     Vision         Perception     Praxis      Pertinent Vitals/Pain Pain Assessment: No/denies pain     Hand Dominance     Extremity/Trunk Assessment Upper Extremity Assessment Upper Extremity Assessment: Generalized weakness   Lower Extremity Assessment Lower Extremity Assessment: Defer to PT evaluation   Cervical / Trunk Assessment Cervical / Trunk Assessment: Normal   Communication Communication Communication: No difficulties   Cognition Arousal/Alertness: Awake/alert Behavior During Therapy: WFL for tasks assessed/performed Overall Cognitive Status: Within Functional Limits for tasks assessed  General Comments  Pt's son present during session.     Exercises     Shoulder Instructions      Home Living Family/patient expects to be discharged to:: Private residence Living Arrangements: Spouse/significant other Available Help at Discharge: Family Type of Home: House Home Access: Stairs to enter Technical brewer  of Steps: 4 Entrance Stairs-Rails: [place]: One level     Bathroom Shower/Tub: Teacher, early years/pre: [place]: Environmental consultant - 2 wheels;Cane - single point;Toilet riser   Additional Comments: on 3L of oxygen       Prior Functioning/Environment Level of Independence: Independent                 OT Problem List: Decreased strength;Decreased activity tolerance;Impaired balance (sitting and/or standing);Decreased knowledge of use of DME or AE;Decreased knowledge of precautions;Cardiopulmonary status limiting activity      OT Treatment/Interventions: Self-care/ADL training;Therapeutic exercise;Energy conservation;DME and/or AE instruction;Therapeutic activities;Patient/family education;Balance training    OT Goals(Current goals can be found in the care plan section) Acute Rehab OT Goals Patient Stated Goal: to feel better OT Goal Formulation: With patient/family Time For Goal Achievement: 02/05/20 Potential to Achieve Goals: Good ADL Goals Pt Will Perform Grooming: with modified independence;standing Pt Will Perform Lower Body Bathing: with modified independence;sit to/from stand Pt Will Perform Lower Body Dressing: with modified independence;sit to/from stand Pt Will Transfer to Toilet: with modified independence;ambulating Pt Will Perform Toileting - Clothing Manipulation and hygiene: with modified independence;sit to/from stand  OT Frequency: Min 2X/week   Barriers to D/C:            Co-evaluation              AM-PAC OT "6 Clicks" Daily Activity     Outcome Measure Help from another person eating meals?: None Help from another person taking care of personal grooming?: None Help from another person toileting, which includes using toliet, bedpan, or urinal?: A Little Help from another person bathing (including washing, rinsing, drying)?: A Little Help from another person to put on and taking off regular upper body  clothing?: None Help from another person to put on and taking off regular lower body clothing?: A Little 6 Click Score: 21   End of Session Equipment Utilized During Treatment: Rolling walker;Oxygen (3L)  Activity Tolerance: Other (comment);Patient limited by fatigue (DOE 2/4) Patient left: in bed;with call bell/phone within reach;with bed alarm set;with family/visitor present;Other (comment) (sitting EOB, son present)  OT Visit Diagnosis: Unsteadiness on feet (R26.81);Muscle weakness (generalized) (M62.81)                Time: 9518-8416 OT Time Calculation (min): 22 min Charges:  OT General Charges $OT Visit: 1 Visit OT Evaluation $OT Eval Low Complexity: [place], OT Acute Rehabilitation Services Pager: [phone] Office: [phone]   Hortencia Pilar 01/22/2020, 10:36 AM

## 2020-01-22 NOTE — Progress Notes (Signed)
Bilateral lower extremity venous study completed.      Please see CV Proc for preliminary results.   Laree Garron, RVT  

## 2020-01-22 NOTE — Evaluation (Signed)
Clinical/Bedside Swallow Evaluation Patient Details  Name: Courtney Cline MRN: [id number] Date of Birth: 03-03-1933  Today's Date: 01/22/2020 Time: SLP Start Time (ACUTE ONLY): 45 SLP Stop Time (ACUTE ONLY): 1543 SLP Time Calculation (min) (ACUTE ONLY): 15 min  Past Medical History:  Past Medical History:  Diagnosis Date  . Allergic rhinitis   . Arthritis   . Breast calcification seen on mammogram    Right breast  . Breast cancer ([place])   . COPD (chronic obstructive pulmonary disease) ([place])   . GERD (gastroesophageal reflux disease)   . Hyperglycemia   . Hyperlipidemia   . Hypertension   . Low back pain   . On home oxygen therapy    all the time  . Osteopenia   . Peripheral edema   . Shortness of breath   . Subclavian artery stenosis, left ([place])   . Vitamin D deficiency   . Wears dentures    top  . Wears glasses    reading   Past Surgical History:  Past Surgical History:  Procedure Laterality Date  . BREAST BIOPSY    . BREAST LUMPECTOMY     right 2015  . BREAST LUMPECTOMY WITH RADIOACTIVE SEED LOCALIZATION Right 12/20/2013   Procedure: RIGHT BREAST LUMPECTOMY WITH RADIOACTIVE SEED LOCALIZATION;  Surgeon: Erroll Luna, MD;  Location: [facility];  Service: General;  Laterality: Right;  . CAROTID DUPLEX  2014  . CATARACT EXTRACTION     both eyes   HPI:  Pt is a 86 y.o. female with history of persistent A. fib, diastolic CHF, chronic kidney disease stage III, COPD on home oxygen 3 L who presented to the ED due to persistent productive cough for the last few days with upper back pain. CT abdomen: Multifocal patchy airspace opacities seen at both posterior lung bases which could be due to atelectasis or infectious etiology.   Assessment / Plan / Recommendation Clinical Impression  Pt was seen for bedside swallow evaluation with her son present. Both parties reported that the pt has some difficulty swallowing pills and pt stated that she "gets  strangled" on water easily. Oral mechanism exam was [facility]. She presented with maxillary dentures and three mandibular incisors. She tolerated all solids and liquids without signs or symptoms of oropharyngeal dysphagia. A regular texture diet with thin liquids is recommended at this time. Considering her reports of signs of aspiration and difficulty with pills, a modified barium swallow study is recommended to further assess swallow function.  SLP Visit Diagnosis: Dysphagia, unspecified (R13.10)    Aspiration Risk  No limitations    Diet Recommendation Regular;Thin liquid   Liquid Administration via: Cup;Straw Medication Administration: Whole meds with puree Supervision: Patient able to self feed Compensations: Slow rate;Small sips/bites Postural Changes: Seated upright at 90 degrees    Other  Recommendations Oral Care Recommendations: Oral care BID   Follow up Recommendations  (TBD)      Frequency and Duration            Prognosis Prognosis for Safe Diet Advancement: Good      Swallow Study   General Date of Onset: 01/21/20 HPI: Pt is a 86 y.o. female with history of persistent A. fib, diastolic CHF, chronic kidney disease stage III, COPD on home oxygen 3 L who presented to the ED due to persistent productive cough for the last few days with upper back pain. CT abdomen: Multifocal patchy airspace opacities seen at both posterior lung bases which could be due to atelectasis or  infectious etiology. Type of Study: Bedside Swallow Evaluation Previous Swallow Assessment: None Diet Prior to this Study: Regular;Thin liquids Temperature Spikes Noted: No Respiratory Status: Nasal cannula History of Recent Intubation: No Behavior/Cognition: Alert;Cooperative;Pleasant mood Oral Cavity Assessment: Within Functional Limits Oral Care Completed by SLP: No Oral Cavity - Dentition: Dentures, top;Missing dentition (Three mandibular incisors) Vision: Functional for self-feeding Self-Feeding  Abilities: Able to feed self Patient Positioning: Upright in bed Baseline Vocal Quality: Normal Volitional Cough: Strong Volitional Swallow: Able to elicit    Oral/Motor/Sensory Function Overall Oral Motor/Sensory Function: Within functional limits   Ice Chips Ice chips: Within functional limits Presentation: Spoon   Thin Liquid Thin Liquid: Within functional limits Presentation: Straw;Cup    Nectar Thick Nectar Thick Liquid: Not tested   Honey Thick Honey Thick Liquid: Not tested   Puree Puree: Within functional limits Presentation: Spoon   Solid     Solid: Within functional limits Presentation: Demetrius Charity I. Hardin Negus, [place], [place] Office number [phone] Pager [phone]  Horton Marshall 01/22/2020,3:50 PM

## 2020-01-22 NOTE — Plan of Care (Signed)

## 2020-01-22 NOTE — Progress Notes (Signed)
SLP Cancellation Note  Patient Details Name: Courtney Cline MRN: [id number] DOB: July 28, 1933   Cancelled treatment:       Reason Eval/Treat Not Completed: Patient at procedure or test/unavailable (Pt currently on bedside commode. SLP will f/u)  Tobie Poet I. Hardin Negus, [place], [place] Office number [phone] Pager [place] 01/22/2020, 2:33 PM

## 2020-01-22 NOTE — TOC Initial Note (Signed)
Transition of Care [facility]) - Initial/Assessment Note    Patient Details  Name: Courtney Cline MRN: [id number] Date of Birth: 1933/02/18  [facility] CM/SW Contact:    Joanne Chars, LCSW Phone Number: 01/22/2020, 3:52 PM  Clinical Narrative:  CSW met with pt and son regarding discharge plan.  Permission given to speak with husband, two sons.  Pt will consider HH, said she "wanted to think about it" but son is encouraging her to accept services.  Pt is vaccinated, no services currently in the home.  Pt does receive O2 at home through [employer] (confirmed)  Equipment in home: walker only.                  Expected Discharge Plan: [place] Barriers to Discharge: Continued Medical Work up   Patient Goals and CMS Choice Patient states their goals for this hospitalization and ongoing recovery are:: "get back to how I was before I was sick" CMS Medicare.gov Compare Post Acute Care list provided to:: Patient Choice offered to / list presented to : Patient  Expected Discharge Plan and Services Expected Discharge Plan: [place] Choice: [place] arrangements for the past 2 months: Single Family Home                                      Prior Living Arrangements/Services Living arrangements for the past 2 months: Single Family Home Lives with:: Spouse Patient language and need for interpreter reviewed:: Yes Do you feel safe going back to the place where you live?: Yes      Need for Family Participation in Patient Care: Yes (Comment) Care giver support system in place?: Yes (comment) Current home services: Home PT, Home OT Criminal Activity/Legal Involvement Pertinent to Current Situation/Hospitalization: No - Comment as needed  Activities of Daily Living Home Assistive Devices/Equipment: None ADL Screening (condition at time of admission) Patient's cognitive ability adequate to safely  complete daily activities?: Yes Is the patient deaf or have difficulty hearing?: No Does the patient have difficulty seeing, even when wearing glasses/contacts?: No Does the patient have difficulty concentrating, remembering, or making decisions?: No Patient able to express need for assistance with ADLs?: Yes Does the patient have difficulty dressing or bathing?: No Independently performs ADLs?: Yes (appropriate for developmental age) Does the patient have difficulty walking or climbing stairs?: Yes Weakness of Legs: Both Weakness of Arms/Hands: None  Permission Sought/Granted Permission sought to share information with : Family Supports, Chartered certified accountant granted to share information with : Yes, Verbal Permission Granted  Share Information with NAME: spouse Herbie [place], sons [name] and Tanna Furry  Permission granted to share info w AGENCY: HH        Emotional Assessment Appearance:: Appears stated age Attitude/Demeanor/Rapport: Engaged Affect (typically observed): Appropriate, Pleasant Orientation: : Oriented to Self, Oriented to Place, Oriented to  Time, Oriented to Situation Alcohol / Substance Use: Not Applicable Psych Involvement: No (comment)  Admission diagnosis:  Pneumonia [J18.9] Hypoxia [R09.02] CAP (community acquired pneumonia) [J18.9] Acute cystitis without hematuria [N30.00] Pneumonia of both lower lobes due to infectious organism [J18.9] Patient Active Problem List   Diagnosis Date Noted  . CAP (community acquired pneumonia) 01/21/2020  . Nausea 01/21/2020  . Pneumonia 01/21/2020  . Acute cystitis without hematuria   . Persistent atrial fibrillation ([place]) 08/20/2019  .  COPD without exacerbation ([place]) 10/15/2018  . Healthcare maintenance 10/15/2018  . Physical deconditioning 10/15/2018  . Sinusitis 01/29/2018  . Diastolic CHF ([place]) 03/54/6568  . COPD exacerbation ([place]) 06/30/2017  . Vitamin D deficiency   . Allergic rhinitis 11/23/2015  .  Osteopenia with high risk of fracture 11/05/2015  . Post-nasal drip 03/16/2015  . Anemia of chronic disease 03/10/2015  . Subclavian artery stenosis, left ([place]) 04/24/2014  . Bilateral lower extremity edema 04/24/2014  . Essential hypertension 04/24/2014  . Hyperlipidemia 04/24/2014  . Breast cancer of upper-inner quadrant of right female breast ([place]) 11/19/2013  . Chronic hypoxemic respiratory failure ([place]) 10/02/2013   PCP:  Leeroy Cha, MD Pharmacy:   [id number] [place] [place] [place]Phone: [phone]: [phone] [place] [address] [address]Phone: [phone]: [phone]    Social Determinants of Health (SDOH) Interventions    Readmission Risk Interventions No flowsheet data found.

## 2020-01-22 NOTE — Progress Notes (Signed)
PROGRESS NOTE    Courtney Cline  TGG:[id number] DOB: [birth year] DOA: 01/20/2020 PCP: Leeroy Cha, MD   Brief Narrative:  Patient is a 86 year old female with past medical history of persistent A. fib, diastolic CHF, CKD stage III, COPD-on 3 L of oxygen via nasal cannulae at home presented with productive cough and upper back pain since last few days.  Reports nausea, poor appetite, unable to take her medication and diffuse abdominal discomfort.  ED course: Patient was afebrile, CTA chest abdomen pelvis shows features concerning for multifocal pneumonia and also gallbladder stones.  Labs are largely at baseline, troponin-flat x2.  Covid test: Negative.  EKG shows sinus rhythm.  UA concerning for UTI.  Given the multiple comorbidities and patient having nausea unable to take oral medication admitted for observation with IV antibiotics for pneumonia and UTI.  Assessment & Plan:  Multifocal pneumonia: -Patient presented with shortness of breath, productive cough.  CTA chest negative for PE shows multifocal airspace opacity concerning for atelectasis versus infectious etiology.  Patient started on azithromycin and Rocephin. -Urine strep antigen and urine Legionella antigen: Pending. -Remained afebrile, no leukocytosis. -Continue IV antibiotics.  UTI: -Patient UA is concerning for infection.  She is asymptomatic.  Urine culture shows multiple species recommended recollection.  Repeat urine culture is ordered.  Continue Rocephin.  Generalized abdominal pain: -CT abdomen concerning for cholelithiasis.  HIDA scan came back negative for cholecystitis. -LFTs: WNL.  Remained afebrile.  Pain improved.  Persistent A. fib: Rate controlled.  Continue Eliquis and metoprolol.  Continue to monitor heart rate on telemetry.  Hypertension: Stable.  Continue metoprolol and lisinopril.  CKD stage IIIb: Renal function improved.  Continue to monitor.  Anemia of chronic disease: Likely in the  setting of CKD stage III.  H&H is stable.  Continue to monitor.  Chronic hypoxemic respiratory failure: COPD patient is on 3 L of oxygen at home.  Continue same.  CTA chest reviewed negative for PE. -Continue albuterol every 4 hour, Breo Ellipta  Hyperlipidemia: Continue statin  Chronic diastolic CHF: Patient appears euvolemic on exam.   -Continue Lasix 40 mg daily with potassium supplements.  Metoprolol, statin. -Monitor signs for fluid overload. -Doppler ultrasound of lower extremity: Pending  GERD: Continue PPI  Chronic nausea/dysphagia: -Consult SLP for swallow evaluation.  DVT prophylaxis: Eliquis Code Status: Full code Family Communication: Patient's son present at bedside.  Plan of care discussed with patient in length and she verbalized understanding and agreed with it. Disposition Plan: Likely home tomorrow  Consultants:   None  Procedures:   CTA chest abdomen pelvis  Antimicrobials:   Rocephin  Azithromycin  Status is: Inpatient  Dispo: The patient is from: Home              Anticipated d/c is to: Home              Anticipated d/c date is: 1 day              Patient currently is not medically stable to d/c.   Subjective: Patient seen and examined.  Son at bedside.  Patient tells me that she feels better overall.  Continues to have chronic nausea, decreased appetite, problem with swallowing, denies abdominal pain, vomiting, fever, chills, chest pain, shortness of breath, palpitation.   Objective: Vitals:   01/21/20 1737 01/21/20 1944 01/21/20 2311 01/22/20 0819  BP: (!) 142/57  (!) 87/65   Pulse: 84 84 84 82  Resp: 18 18 18 18   Temp: 98.2 F (36.8 C)  97.8 F (36.6 C)   TempSrc:   Oral   SpO2: 100%  97% 96%  Weight:      Height:        Intake/Output Summary (Last 24 hours) at 01/22/2020 1135 Last data filed at 01/22/2020 0331 Gross per 24 hour  Intake 250 ml  Output 50 ml  Net 200 ml   Filed Weights   01/21/20 1731  Weight: 74.9 kg     Examination:  General exam: Appears calm and comfortable, elderly, on 3 L oxygen via nasal cannula, communicating well Respiratory system: Clear to auscultation. Respiratory effort normal. Cardiovascular system: S1 & S2 heard, RRR. No JVD, murmurs, rubs, gallops or clicks. No pedal edema. Gastrointestinal system: Abdomen is nondistended, soft and nontender. No organomegaly or masses felt. Normal bowel sounds heard. Central nervous system: Alert and oriented. No focal neurological deficits. Extremities: Symmetric 5 x 5 power. Skin: No rashes, lesions or ulcers Psychiatry: Judgement and insight appear normal. Mood & affect appropriate.    Data Reviewed: I have personally reviewed following labs and imaging studies  CBC: Recent Labs  Lab 01/20/20 1032 01/21/20 0639 01/22/20 0840  WBC 7.5 6.5 6.5  HGB 9.5* 8.5* 8.9*  HCT 31.5* 28.3* 27.7*  MCV 96.9 98.3 95.2  PLT 299 277 081   Basic Metabolic Panel: Recent Labs  Lab 01/20/20 1032 01/21/20 0639 01/22/20 0840  NA 137 141 142  K 3.5 3.6 3.8  CL 101 105 104  CO2 24 22 26   GLUCOSE 107* 102* 88  BUN 21 25* 22  CREATININE 1.25* 1.17* 1.19*  CALCIUM 9.2 8.6* 8.8*   GFR: Estimated Creatinine Clearance: 31.8 mL/min (A) (by C-G formula based on SCr of 1.19 mg/dL (H)). Liver Function Tests: Recent Labs  Lab 01/20/20 1032 01/21/20 0639  AST 14* 13*  ALT 9 9  ALKPHOS 66 62  BILITOT 0.8 0.7  PROT 6.7 6.0*  ALBUMIN 2.6* 2.2*   Recent Labs  Lab 01/20/20 1032  LIPASE 21   No results for input(s): AMMONIA in the last 168 hours. Coagulation Profile: No results for input(s): INR, PROTIME in the last 168 hours. Cardiac Enzymes: No results for input(s): CKTOTAL, CKMB, CKMBINDEX, TROPONINI in the last 168 hours. BNP (last 3 results) No results for input(s): PROBNP in the last 8760 hours. HbA1C: No results for input(s): HGBA1C in the last 72 hours. CBG: No results for input(s): GLUCAP in the last 168 hours. Lipid  Profile: No results for input(s): CHOL, HDL, LDLCALC, TRIG, CHOLHDL, LDLDIRECT in the last 72 hours. Thyroid Function Tests: No results for input(s): TSH, T4TOTAL, FREET4, T3FREE, THYROIDAB in the last 72 hours. Anemia Panel: No results for input(s): VITAMINB12, FOLATE, FERRITIN, TIBC, IRON, RETICCTPCT in the last 72 hours. Sepsis Labs: No results for input(s): PROCALCITON, LATICACIDVEN in the last 168 hours.  Recent Results (from the past 240 hour(s))  Urine Culture     Status: Abnormal   Collection Time: 01/20/20  5:40 PM   Specimen: Urine, Random  Result Value Ref Range Status   Specimen Description URINE, RANDOM  Final   Special Requests   Final    NONE Performed at [facility], [address], [place], [place] [address]    Culture MULTIPLE SPECIES PRESENT, SUGGEST RECOLLECTION (A)  Final   Report Status 01/22/2020 FINAL  Final  Resp Panel by RT-PCR (Flu A&B, Covid) Nasopharyngeal Swab     Status: None   Collection Time: 01/21/20  2:40 AM   Specimen: Nasopharyngeal Swab; Nasopharyngeal(NP)  swabs in vial transport medium  Result Value Ref Range Status   SARS Coronavirus 2 by RT PCR NEGATIVE NEGATIVE Final    Comment: (NOTE) SARS-CoV-2 target nucleic acids are NOT DETECTED.  The SARS-CoV-2 RNA is generally detectable in upper respiratory specimens during the acute phase of infection. The lowest concentration of SARS-CoV-2 viral copies this assay can detect is 138 copies/mL. A negative result does not preclude SARS-Cov-2 infection and should not be used as the sole basis for treatment or other patient management decisions. A negative result may occur with  improper specimen collection/handling, submission of specimen other than nasopharyngeal swab, presence of viral mutation(s) within the areas targeted by this assay, and inadequate number of viral copies(<138 copies/mL). A negative result must be combined with clinical observations, patient history, and  epidemiological information. The expected result is Negative.  Fact Sheet for Patients:  EntrepreneurPulse.com.au  Fact Sheet for Healthcare Providers:  IncredibleEmployment.be  This test is no t yet approved or cleared by the [place] FDA and  has been authorized for detection and/or diagnosis of SARS-CoV-2 by FDA under an Emergency Use Authorization (EUA). This EUA will remain  in effect (meaning this test can be used) for the duration of the COVID-19 declaration under Section 564(b)(1) of the Act, 21 U.S.C.section 360bbb-3(b)(1), unless the authorization is terminated  or revoked sooner.       Influenza A by PCR NEGATIVE NEGATIVE Final   Influenza B by PCR NEGATIVE NEGATIVE Final    Comment: (NOTE) The Xpert Xpress SARS-CoV-2/FLU/RSV plus assay is intended as an aid in the diagnosis of influenza from Nasopharyngeal swab specimens and should not be used as a sole basis for treatment. Nasal washings and aspirates are unacceptable for Xpert Xpress SARS-CoV-2/FLU/RSV testing.  Fact Sheet for Patients: EntrepreneurPulse.com.au  Fact Sheet for Healthcare Providers: IncredibleEmployment.be  This test is not yet approved or cleared by the [place] FDA and has been authorized for detection and/or diagnosis of SARS-CoV-2 by FDA under an Emergency Use Authorization (EUA). This EUA will remain in effect (meaning this test can be used) for the duration of the COVID-19 declaration under Section 564(b)(1) of the Act, 21 U.S.C. section 360bbb-3(b)(1), unless the authorization is terminated or revoked.  Performed at [facility], [place] [address], [place], [place] [address]   Culture, sputum-assessment     Status: None   Collection Time: 01/21/20  5:42 AM   Specimen: Expectorated Sputum  Result Value Ref Range Status   Specimen Description EXPECTORATED SPUTUM  Final   Special Requests NONE  Final    Sputum evaluation   Final    Sputum specimen not acceptable for testing.  Please recollect.   RESULT CALLED TO, READ BACK BY AND VERIFIED WITH: RN Sallyanne Havers [phone] FCP Performed at [facility] [address], [place], [place] [address]    Report Status 01/21/2020 FINAL  Final      Radiology Studies: CT Angio Chest PE W and/or Wo Contrast  Result Date: 01/21/2020 CLINICAL DATA:  Abdominal pain and cough EXAM: CT ANGIOGRAPHY CHEST WITH CONTRAST TECHNIQUE: Multidetector CT imaging of the chest was performed using the standard protocol during bolus administration of intravenous contrast. Multiplanar CT image reconstructions and MIPs were obtained to evaluate the vascular anatomy. CONTRAST:  68mL OMNIPAQUE IOHEXOL 350 MG/ML SOLN COMPARISON:  None. FINDINGS: Cardiovascular: There is a optimal opacification of the pulmonary arteries. There is no central,segmental, or subsegmental filling defects within the pulmonary arteries. The heart is normal  in size. No pericardial effusion or thickening. No evidence right heart strain. There is normal three-vessel brachiocephalic anatomy without proximal stenosis. Aortic and mitral valve calcifications are seen. Coronary artery and aortic atherosclerosis is seen. Mediastinum/Nodes: No hilar, mediastinal, or axillary adenopathy. Thyroid gland, trachea, and esophagus demonstrate no significant findings. Lungs/Pleura: Small bilateral pleural effusions are present, right greater than left. There is patchy airspace consolidation with air bronchogram seen at the posterior lung bases, left greater than right. Centrilobular emphysematous changes seen predominantly at both upper lungs. Upper Abdomen: No acute abnormalities present in the visualized portions of the upper abdomen. Musculoskeletal: No chest wall abnormality. No acute or significant osseous findings. Review of the MIP images confirms the above findings. Abdomen/pelvis: Hepatobiliary: The liver is  normal in density without focal abnormality.The main portal vein is patent. Large calcified gallstones are present. Pancreas: Unremarkable. No pancreatic ductal dilatation or surrounding inflammatory changes. Spleen: Normal in size without focal abnormality. Adrenals/Urinary Tract: Both adrenal glands appear normal. The kidneys and collecting system appear normal without evidence of urinary tract calculus or hydronephrosis. Bladder is unremarkable. Stomach/Bowel: The stomach, small bowel, and colon are normal in appearance. No inflammatory changes, wall thickening, or obstructive findings. Extensive colonic diverticulosis is seen. Vascular/Lymphatic: There are no enlarged mesenteric, retroperitoneal, or pelvic lymph nodes. Scattered aortic atherosclerotic calcifications are seen without aneurysmal dilatation. Reproductive: The uterus and adnexa are unremarkable. Other: No evidence of abdominal wall mass or hernia. Musculoskeletal: No acute or significant osseous findings. IMPRESSION: No central, segmental, or subsegmental pulmonary embolism. Multifocal patchy airspace opacities seen at both posterior lung bases which could be due to atelectasis or infectious etiology. Small bilateral pleural effusions. Diverticulosis without diverticulitis. Cholelithiasis Aortic Atherosclerosis (ICD10-I70.0). Electronically Signed   By: Prudencio Pair M.D.   On: 01/21/2020 01:50   NM Hepatobiliary Liver Func  Result Date: 01/21/2020 CLINICAL DATA:  Persistent productive cough with upper back pain, as well as diffuse abdominal discomfort. EXAM: NUCLEAR MEDICINE HEPATOBILIARY IMAGING TECHNIQUE: Sequential images of the abdomen were obtained out to 60 minutes following intravenous administration of radiopharmaceutical. RADIOPHARMACEUTICALS:  5.3 mCi Tc-74m  Choletec IV COMPARISON:  None. FINDINGS: Prompt uptake and biliary excretion of activity by the liver is seen. Gallbladder activity is visualized, consistent with patency of  cystic duct. Biliary activity passes into small bowel, consistent with patent common bile duct. IMPRESSION: Normal nuclear medicine hepatobiliary scan. Electronically Signed   By: Virgina Norfolk M.D.   On: 01/21/2020 17:40   CT ABDOMEN PELVIS W CONTRAST  Result Date: 01/21/2020 CLINICAL DATA:  Abdominal pain and cough EXAM: CT ANGIOGRAPHY CHEST WITH CONTRAST TECHNIQUE: Multidetector CT imaging of the chest was performed using the standard protocol during bolus administration of intravenous contrast. Multiplanar CT image reconstructions and MIPs were obtained to evaluate the vascular anatomy. CONTRAST:  63mL OMNIPAQUE IOHEXOL 350 MG/ML SOLN COMPARISON:  None. FINDINGS: Cardiovascular: There is a optimal opacification of the pulmonary arteries. There is no central,segmental, or subsegmental filling defects within the pulmonary arteries. The heart is normal in size. No pericardial effusion or thickening. No evidence right heart strain. There is normal three-vessel brachiocephalic anatomy without proximal stenosis. Aortic and mitral valve calcifications are seen. Coronary artery and aortic atherosclerosis is seen. Mediastinum/Nodes: No hilar, mediastinal, or axillary adenopathy. Thyroid gland, trachea, and esophagus demonstrate no significant findings. Lungs/Pleura: Small bilateral pleural effusions are present, right greater than left. There is patchy airspace consolidation with air bronchogram seen at the posterior lung bases, left greater than right. Centrilobular emphysematous changes seen predominantly  at both upper lungs. Upper Abdomen: No acute abnormalities present in the visualized portions of the upper abdomen. Musculoskeletal: No chest wall abnormality. No acute or significant osseous findings. Review of the MIP images confirms the above findings. Abdomen/pelvis: Hepatobiliary: The liver is normal in density without focal abnormality.The main portal vein is patent. Large calcified gallstones are  present. Pancreas: Unremarkable. No pancreatic ductal dilatation or surrounding inflammatory changes. Spleen: Normal in size without focal abnormality. Adrenals/Urinary Tract: Both adrenal glands appear normal. The kidneys and collecting system appear normal without evidence of urinary tract calculus or hydronephrosis. Bladder is unremarkable. Stomach/Bowel: The stomach, small bowel, and colon are normal in appearance. No inflammatory changes, wall thickening, or obstructive findings. Extensive colonic diverticulosis is seen. Vascular/Lymphatic: There are no enlarged mesenteric, retroperitoneal, or pelvic lymph nodes. Scattered aortic atherosclerotic calcifications are seen without aneurysmal dilatation. Reproductive: The uterus and adnexa are unremarkable. Other: No evidence of abdominal wall mass or hernia. Musculoskeletal: No acute or significant osseous findings. IMPRESSION: No central, segmental, or subsegmental pulmonary embolism. Multifocal patchy airspace opacities seen at both posterior lung bases which could be due to atelectasis or infectious etiology. Small bilateral pleural effusions. Diverticulosis without diverticulitis. Cholelithiasis Aortic Atherosclerosis (ICD10-I70.0). Electronically Signed   By: Prudencio Pair M.D.   On: 01/21/2020 01:50    Scheduled Meds: . albuterol  2 puff Inhalation Q4H  . apixaban  5 mg Oral BID  . fluticasone furoate-vilanterol  1 puff Inhalation Daily   And  . umeclidinium bromide  1 puff Inhalation Daily  . furosemide  40 mg Oral Daily  . lisinopril  40 mg Oral Daily  . metoprolol tartrate  25 mg Oral BID  . ondansetron (ZOFRAN) IV  4 mg Intravenous Once  . pantoprazole  40 mg Oral Daily  . polyethylene glycol  17 g Oral BID  . potassium chloride SA  20 mEq Oral Daily  . pravastatin  40 mg Oral q1800   Continuous Infusions: . azithromycin 500 mg (01/21/20 2313)  . cefTRIAXone (ROCEPHIN)  IV 2 g (01/21/20 0958)     LOS: 1 day   Time spent: 40  minutes.  Mckinley Jewel, MD [facility]  If 7PM-7AM, please contact night-coverage [email or web address] 01/22/2020, 11:35 AM

## 2020-01-23 ENCOUNTER — Inpatient Hospital Stay (HOSPITAL_COMMUNITY): Payer: Medicare HMO

## 2020-01-23 DIAGNOSIS — I771 Stricture of artery: Secondary | ICD-10-CM

## 2020-01-23 DIAGNOSIS — J9611 Chronic respiratory failure with hypoxia: Secondary | ICD-10-CM

## 2020-01-23 DIAGNOSIS — J449 Chronic obstructive pulmonary disease, unspecified: Secondary | ICD-10-CM

## 2020-01-23 LAB — COMPREHENSIVE METABOLIC PANEL
ALT: 11 U/L (ref 0–44)
AST: 14 U/L — ABNORMAL LOW (ref 15–41)
Albumin: 2.1 g/dL — ABNORMAL LOW (ref 3.5–5.0)
Alkaline Phosphatase: 66 U/L (ref 38–126)
Anion gap: 10 (ref 5–15)
BUN: 16 mg/dL (ref 8–23)
CO2: 26 mmol/L (ref 22–32)
Calcium: 8.8 mg/dL — ABNORMAL LOW (ref 8.9–10.3)
Chloride: 106 mmol/L (ref 98–111)
Creatinine, Ser: 1.21 mg/dL — ABNORMAL HIGH (ref 0.44–1.00)
GFR, Estimated: 44 mL/min — ABNORMAL LOW (ref >60)
Glucose, Bld: 92 mg/dL (ref 70–99)
Potassium: 3.7 mmol/L (ref 3.5–5.1)
Sodium: 142 mmol/L (ref 135–145)
Total Bilirubin: 0.5 mg/dL (ref 0.3–1.2)
Total Protein: 5.8 g/dL — ABNORMAL LOW (ref 6.5–8.1)

## 2020-01-23 LAB — CBC
HCT: 28.1 % — ABNORMAL LOW (ref 36.0–46.0)
Hemoglobin: 8.4 g/dL — ABNORMAL LOW (ref 12.0–15.0)
MCH: 29.2 pg (ref 26.0–34.0)
MCHC: 29.9 g/dL — ABNORMAL LOW (ref 30.0–36.0)
MCV: 97.6 fL (ref 80.0–100.0)
Platelets: 318 10*3/uL (ref 150–400)
RBC: 2.88 MIL/uL — ABNORMAL LOW (ref 3.87–5.11)
RDW: 13.1 % (ref 11.5–15.5)
WBC: 6.5 10*3/uL (ref 4.0–10.5)
nRBC: 0 % (ref 0.0–0.2)

## 2020-01-23 LAB — MAGNESIUM: Magnesium: 2.1 mg/dL (ref 1.7–2.4)

## 2020-01-23 MED ORDER — AMOXICILLIN-POT CLAVULANATE 875-125 MG PO TABS: 1.0000 | ORAL_TABLET | Freq: Two times a day (BID) | ORAL | 0 refills | Status: AC

## 2020-01-23 MED ORDER — ALBUTEROL SULFATE HFA 108 (90 BASE) MCG/ACT IN AERS: 2.0000 | INHALATION_SPRAY | RESPIRATORY_TRACT | Status: DC | PRN

## 2020-01-23 NOTE — Progress Notes (Signed)
Modified Barium Swallow Progress Note  Patient Details  Name: Courtney Cline MRN: [id number] Date of Birth: Mar 08, 1933  Today's Date: 01/23/2020  Modified Barium Swallow completed.  Full report located under Chart Review in the Imaging Section.  Brief recommendations include the following:  Clinical Impression  Pt was seen in radiology suite for modified barium swallow study. Trials of puree solids, dysphagia 3 solids, regular texture solids, a 45mm barium tablet, and thin liquids via cup and straw were administered. Pt's oropharyngeal swallow mechanism was within functional limits without any instances of penetration/aspiration. Esophageal screening revealed stasis of the barium tablet (taken with thin liquids) in the upper thoracic esophagus. Use of additional boluses of thin liquids or puree did not facilitate transport, but transit was ultimately facilitated with a dysphagia 3 bolus. Determination of the etiology of this is beyond the scope of this study. Consider dedicated esophageal assessment (e.g., esophagram) to determine etiology and assess whether intervention is necessary. A regular texture diet with thin liquids is still recommended. The results of the modified barium swallow study were discussed with the pt. She indicated that she has been living with and adjusting to this difficulty with pills for some time. She stated that she is comfortable continuing to do so and agreed that she will take smaller pills whole in puree and break/crush larger pills. Further skilled SLP services are not clinically indicated at this time.   Swallow Evaluation Recommendations   Recommended Consults: Consider esophageal assessment   SLP Diet Recommendations: Regular solids;Thin liquid   Liquid Administration via: Cup;Straw   Medication Administration: Whole meds with puree (crush larger pills)   Supervision: Patient able to self feed   Compensations: Slow rate;Small sips/bites   Postural  Changes: Seated upright at 90 degrees   Oral Care Recommendations: Oral care BID      Courtney Stokely I. Courtney Cline, Courtney Cline, Courtney Cline Office number [phone] Pager [phone]   Horton Marshall 01/23/2020,11:28 AM

## 2020-01-23 NOTE — Discharge Summary (Signed)
Physician Discharge Summary  Courtney Cline OIN:[id number] DOB: 1933-09-01 DOA: 01/20/2020  PCP: Leeroy Cha, MD  Admit date: 01/20/2020 Discharge date: 01/23/2020  Admitted From: Home Disposition: Home with home health services PT/OT  Recommendations for Outpatient Follow-up:  1. Follow-up with PCP in 1 week 2. Repeat CBC, BMP and magnesium level on follow-up visit 3. Recommend outpatient esophagogram 4. Follow-up pending urine strep antigen and Legionella antigen  Home Health: Yes  equipment/Devices: None Discharge Condition: Stable CODE STATUS: Full code Diet recommendation: Regular solids, thin liquids, hold meds with pure (crush larger pills)  Brief/Interim Summary: Patient is a 86 year old female with past medical history of persistent A. fib, diastolic CHF, CKD stage III, COPD-on 3 L of oxygen via nasal cannulae at home presented with productive cough and upper back pain since last few days.  Reports nausea, poor appetite, unable to take her medication and diffuse abdominal discomfort.  ED course: Patient was afebrile, CTA chest abdomen pelvis shows features concerning for multifocal pneumonia/atelectasis and also gallbladder stones.  Labs are largely at baseline, troponin-flat x2.  Covid test: Negative.  EKG shows sinus rhythm.  UA concerning for UTI.  Given the multiple comorbidities and patient having nausea unable to take oral medication admitted for observation with IV antibiotics for pneumonia and UTI.  Multifocal pneumonia: -Patient presented with shortness of breath, productive cough.  CTA chest negative for PE shows multifocal airspace opacity concerning for atelectasis versus infectious etiology.  Patient started on azithromycin and Rocephin on admission. -Urine strep antigen and urine Legionella antigen: Pending. -Remained afebrile, no leukocytosis. -Maintained oxygen saturation on 3 L of oxygen which is her baseline. -She received total 3 doses of IV  Rocephin and azithromycin.  We will discharge her on Augmentin twice daily for 4 more days to complete 7-day course of antibiotics  UTI: -Patient UA is concerning for infection.  She remained asymptomatic.  Urine culture shows multiple species recommended recollection.    Completed Rocephin x3.  Generalized abdominal pain: -CT abdomen concerning for cholelithiasis.  HIDA scan came back negative for cholecystitis. -LFTs: WNL.  Remained afebrile.  Pain improved.  Persistent A. fib: Rate controlled.  Continued Eliquis and metoprolol.   Hypertension: Remained stable.  Continued metoprolol and lisinopril.  CKD stage IIIb:  Remained at baseline.  Anemia of chronic disease: Likely in the setting of CKD stage III.  H&H remained stable between 8-9.  Chronic hypoxemic respiratory failure: Secondary to underlying  COPD, on 3 L at baseline at home.  CTA chest reviewed negative for PE.  No wheezing noted on exam. -Continued home inhalers albuterol every 4 hour, Breo Ellipta  Hyperlipidemia: Continued statin  Chronic diastolic CHF: Patient appears euvolemic on exam.   -Continued Lasix 40 mg daily with potassium supplements.  Metoprolol, statin. -Monitored signs for fluid overload. -Doppler ultrasound of lower extremity: Negative for DVT  GERD: Continued Pepcid and PPI  Chronic nausea/dysphagia: -Consult SLP for swallow evaluation.  Patient underwent modified barium swallow on 12/9 revealed stasis of barium tablet in the upper thoracic esophagus.  Recommended esophageal assessment however patient wishes to go home.  Discussed with patient's son who agreed with the discharge and follow-up with PCP and outpatient esophageal gram for further assessment.  Discharge Diagnoses:  Multifocal pneumonia UTI Generalized abdominal pain Persistent A. fib Hypertension CKD stage IIIb Anemia of chronic disease Chronic hypoxemic respiratory failure in the setting of COPD Hyperlipidemia Chronic  diastolic CHF GERD Chronic nausea/dysphagia   Discharge Instructions  Discharge Instructions    Discharge  instructions   Complete by: As directed    Follow-up with PCP in 1 week Repeat CBC, BMP and magnesium level on follow-up visit Finish antibiotics as prescribed Recommend esophagogram outpatient   Increase activity slowly   Complete by: As directed      Allergies as of 01/23/2020   No Known Allergies     Medication List    STOP taking these medications   nystatin powder Commonly known as: MYCOSTATIN/NYSTOP     TAKE these medications   acetaminophen 325 MG tablet Commonly known as: TYLENOL Take 650 mg by mouth every 6 (six) hours as needed for mild pain or headache.   amoxicillin-clavulanate 875-125 MG tablet Commonly known as: Augmentin Take 1 tablet by mouth 2 (two) times daily for 4 days.   apixaban 5 MG Tabs tablet Commonly known as: ELIQUIS Take 1 tablet (5 mg total) by mouth 2 (two) times daily.   azelastine 0.1 % nasal spray Commonly known as: ASTELIN Place 2 sprays into both nostrils 2 (two) times daily. Use in each nostril as directed What changed:   when to take this  reasons to take this  additional instructions   BENADRYL PO Take 1 tablet by mouth daily as needed (allergy).   famotidine 20 MG tablet Commonly known as: PEPCID Take 20 mg by mouth every other day.   furosemide 40 MG tablet Commonly known as: LASIX Take 1 tablet (40 mg total) by mouth 2 (two) times daily. <PLEASE MAKE APPOINTMENT FOR REFILLS> What changed:   when to take this  additional instructions   Klor-Con M20 20 MEQ tablet Generic drug: potassium chloride SA TAKE 1 TABLET BY MOUTH DAILY What changed: how much to take   lisinopril 40 MG tablet Commonly known as: ZESTRIL Take 40 mg by mouth daily.   lovastatin 40 MG tablet Commonly known as: MEVACOR Take 40 mg by mouth at bedtime.   metoprolol tartrate 25 MG tablet Commonly known as: LOPRESSOR Take 1  tablet (25 mg total) by mouth 2 (two) times daily.   omeprazole 20 MG capsule Commonly known as: PRILOSEC Take 20 mg by mouth every other day.   ProAir HFA 108 (90 Base) MCG/ACT inhaler Generic drug: albuterol TAKE 2 PUFFS BY MOUTH EVERY 6 HOURS AS NEEDED FOR WHEEZE OR SHORTNESS OF BREATH What changed: See the new instructions.   Trelegy Ellipta 100-62.5-25 MCG/INH Aepb Generic drug: Fluticasone-Umeclidin-Vilant Inhale 1 puff into the lungs daily. What changed: Another medication with the same name was removed. Continue taking this medication, and follow the directions you see here.   VITAMIN B 12 PO Take 1 tablet by mouth daily.   Vitamin D 50 MCG (2000 UT) Caps Take 2,000 Units by mouth daily.            Durable Medical Equipment  (From admission, onward)         Start     Ordered   01/23/20 1131  For home use only DME 3 n 1  Once        01/23/20 1131          Follow-up Information    Leeroy Cha, MD Follow up in 1 week(s).   Specialty: Internal Medicine Contact information: [address] [address] [phone]              No Known Allergies  Consultations:  None   Procedures/Studies: CT Angio Chest PE W and/or Wo Contrast  Result Date: 01/21/2020 CLINICAL DATA:  Abdominal  pain and cough EXAM: CT ANGIOGRAPHY CHEST WITH CONTRAST TECHNIQUE: Multidetector CT imaging of the chest was performed using the standard protocol during bolus administration of intravenous contrast. Multiplanar CT image reconstructions and MIPs were obtained to evaluate the vascular anatomy. CONTRAST:  30mL OMNIPAQUE IOHEXOL 350 MG/ML SOLN COMPARISON:  None. FINDINGS: Cardiovascular: There is a optimal opacification of the pulmonary arteries. There is no central,segmental, or subsegmental filling defects within the pulmonary arteries. The heart is normal in size. No pericardial effusion or thickening. No evidence right heart strain. There is  normal three-vessel brachiocephalic anatomy without proximal stenosis. Aortic and mitral valve calcifications are seen. Coronary artery and aortic atherosclerosis is seen. Mediastinum/Nodes: No hilar, mediastinal, or axillary adenopathy. Thyroid gland, trachea, and esophagus demonstrate no significant findings. Lungs/Pleura: Small bilateral pleural effusions are present, right greater than left. There is patchy airspace consolidation with air bronchogram seen at the posterior lung bases, left greater than right. Centrilobular emphysematous changes seen predominantly at both upper lungs. Upper Abdomen: No acute abnormalities present in the visualized portions of the upper abdomen. Musculoskeletal: No chest wall abnormality. No acute or significant osseous findings. Review of the MIP images confirms the above findings. Abdomen/pelvis: Hepatobiliary: The liver is normal in density without focal abnormality.The main portal vein is patent. Large calcified gallstones are present. Pancreas: Unremarkable. No pancreatic ductal dilatation or surrounding inflammatory changes. Spleen: Normal in size without focal abnormality. Adrenals/Urinary Tract: Both adrenal glands appear normal. The kidneys and collecting system appear normal without evidence of urinary tract calculus or hydronephrosis. Bladder is unremarkable. Stomach/Bowel: The stomach, small bowel, and colon are normal in appearance. No inflammatory changes, wall thickening, or obstructive findings. Extensive colonic diverticulosis is seen. Vascular/Lymphatic: There are no enlarged mesenteric, retroperitoneal, or pelvic lymph nodes. Scattered aortic atherosclerotic calcifications are seen without aneurysmal dilatation. Reproductive: The uterus and adnexa are unremarkable. Other: No evidence of abdominal wall mass or hernia. Musculoskeletal: No acute or significant osseous findings. IMPRESSION: No central, segmental, or subsegmental pulmonary embolism. Multifocal patchy  airspace opacities seen at both posterior lung bases which could be due to atelectasis or infectious etiology. Small bilateral pleural effusions. Diverticulosis without diverticulitis. Cholelithiasis Aortic Atherosclerosis (ICD10-I70.0). Electronically Signed   By: Prudencio Pair M.D.   On: 01/21/2020 01:50   NM Hepatobiliary Liver Func  Result Date: 01/21/2020 CLINICAL DATA:  Persistent productive cough with upper back pain, as well as diffuse abdominal discomfort. EXAM: NUCLEAR MEDICINE HEPATOBILIARY IMAGING TECHNIQUE: Sequential images of the abdomen were obtained out to 60 minutes following intravenous administration of radiopharmaceutical. RADIOPHARMACEUTICALS:  5.3 mCi Tc-40m  Choletec IV COMPARISON:  None. FINDINGS: Prompt uptake and biliary excretion of activity by the liver is seen. Gallbladder activity is visualized, consistent with patency of cystic duct. Biliary activity passes into small bowel, consistent with patent common bile duct. IMPRESSION: Normal nuclear medicine hepatobiliary scan. Electronically Signed   By: Virgina Norfolk M.D.   On: 01/21/2020 17:40   CT ABDOMEN PELVIS W CONTRAST  Result Date: 01/21/2020 CLINICAL DATA:  Abdominal pain and cough EXAM: CT ANGIOGRAPHY CHEST WITH CONTRAST TECHNIQUE: Multidetector CT imaging of the chest was performed using the standard protocol during bolus administration of intravenous contrast. Multiplanar CT image reconstructions and MIPs were obtained to evaluate the vascular anatomy. CONTRAST:  74mL OMNIPAQUE IOHEXOL 350 MG/ML SOLN COMPARISON:  None. FINDINGS: Cardiovascular: There is a optimal opacification of the pulmonary arteries. There is no central,segmental, or subsegmental filling defects within the pulmonary arteries. The heart is normal in size. No pericardial effusion or  thickening. No evidence right heart strain. There is normal three-vessel brachiocephalic anatomy without proximal stenosis. Aortic and mitral valve calcifications are  seen. Coronary artery and aortic atherosclerosis is seen. Mediastinum/Nodes: No hilar, mediastinal, or axillary adenopathy. Thyroid gland, trachea, and esophagus demonstrate no significant findings. Lungs/Pleura: Small bilateral pleural effusions are present, right greater than left. There is patchy airspace consolidation with air bronchogram seen at the posterior lung bases, left greater than right. Centrilobular emphysematous changes seen predominantly at both upper lungs. Upper Abdomen: No acute abnormalities present in the visualized portions of the upper abdomen. Musculoskeletal: No chest wall abnormality. No acute or significant osseous findings. Review of the MIP images confirms the above findings. Abdomen/pelvis: Hepatobiliary: The liver is normal in density without focal abnormality.The main portal vein is patent. Large calcified gallstones are present. Pancreas: Unremarkable. No pancreatic ductal dilatation or surrounding inflammatory changes. Spleen: Normal in size without focal abnormality. Adrenals/Urinary Tract: Both adrenal glands appear normal. The kidneys and collecting system appear normal without evidence of urinary tract calculus or hydronephrosis. Bladder is unremarkable. Stomach/Bowel: The stomach, small bowel, and colon are normal in appearance. No inflammatory changes, wall thickening, or obstructive findings. Extensive colonic diverticulosis is seen. Vascular/Lymphatic: There are no enlarged mesenteric, retroperitoneal, or pelvic lymph nodes. Scattered aortic atherosclerotic calcifications are seen without aneurysmal dilatation. Reproductive: The uterus and adnexa are unremarkable. Other: No evidence of abdominal wall mass or hernia. Musculoskeletal: No acute or significant osseous findings. IMPRESSION: No central, segmental, or subsegmental pulmonary embolism. Multifocal patchy airspace opacities seen at both posterior lung bases which could be due to atelectasis or infectious etiology.  Small bilateral pleural effusions. Diverticulosis without diverticulitis. Cholelithiasis Aortic Atherosclerosis (ICD10-I70.0). Electronically Signed   By: Prudencio Pair M.D.   On: 01/21/2020 01:50   DG Swallowing Func-Speech Pathology  Result Date: 01/23/2020 Objective Swallowing Evaluation: Type of Study: MBS-Modified Barium Swallow Study  Patient Details Name: Katreena Schupp MRN: [id number] Date of Birth: August 15, 1933 Today's Date: 01/23/2020 Time: SLP Start Time (ACUTE ONLY): 1035 -SLP Stop Time (ACUTE ONLY): 1054 SLP Time Calculation (min) (ACUTE ONLY): 19 min Past Medical History: Past Medical History: Diagnosis Date . Allergic rhinitis  . Arthritis  . Breast calcification seen on mammogram   Right breast . Breast cancer ([facility])  . COPD (chronic obstructive pulmonary disease) ([facility])  . GERD (gastroesophageal reflux disease)  . Hyperglycemia  . Hyperlipidemia  . Hypertension  . Low back pain  . On home oxygen therapy   all the time . Osteopenia  . Peripheral edema  . Shortness of breath  . Subclavian artery stenosis, left ([facility])  . Vitamin D deficiency  . Wears dentures   top . Wears glasses   reading Past Surgical History: Past Surgical History: Procedure Laterality Date . BREAST BIOPSY   . BREAST LUMPECTOMY    right 2015 . BREAST LUMPECTOMY WITH RADIOACTIVE SEED LOCALIZATION Right 12/20/2013  Procedure: RIGHT BREAST LUMPECTOMY WITH RADIOACTIVE SEED LOCALIZATION;  Surgeon: Erroll Luna, MD;  Location: [facility];  Service: General;  Laterality: Right; . CAROTID DUPLEX  2014 . CATARACT EXTRACTION    both eyes HPI: Pt is a 86 y.o. female with history of persistent A. fib, diastolic CHF, chronic kidney disease stage III, COPD on home oxygen 3 L who presented to the ED due to persistent productive cough for the last few days with upper back pain. CT abdomen: Multifocal patchy airspace opacities seen at both posterior lung bases which could be due to atelectasis or infectious etiology.  No  data  recorded Assessment / Plan / Recommendation CHL IP CLINICAL IMPRESSIONS 01/23/2020 Clinical Impression Pt was seen in radiology suite for modified barium swallow study. Trials of puree solids, dysphagia 3 solids, regular texture solids, a 59mm barium tablet, and thin liquids via cup and straw were administered. Pt's oropharyngeal swallow mechanism was within functional limits without any instances of penetration/aspiration. Esophageal screening revealed stasis of the barium tablet (taken with thin liquids) in the upper thoracic esophagus. Use of additional boluses of thin liquids or puree did not facilitate transport, but transit was ultimately facilitated with a dysphagia 3 bolus. Determination of the etiology of this is beyond the scope of this study. Consider dedicated esophageal assessment (e.g., esophagram) to determine etiology and assess whether intervention is necessary. A regular texture diet with thin liquids is still recommended. The results of the modified barium swallow study were discussed with the pt. She indicated that she has been living with and adjusting to this difficulty with pills for some time. She stated that she is comfortable continuing to do so and agreed that she will take smaller pills whole in puree and break/crush larger pills. Further skilled SLP services are not clinically indicated at this time. SLP Visit Diagnosis Dysphagia, unspecified (R13.10) Attention and concentration deficit following -- Frontal lobe and executive function deficit following -- Impact on safety and function No limitations   CHL IP TREATMENT RECOMMENDATION 01/23/2020 Treatment Recommendations Therapy as outlined in treatment plan below   Prognosis 01/23/2020 Prognosis for Safe Diet Advancement Good Barriers to Reach Goals -- Barriers/Prognosis Comment -- CHL IP DIET RECOMMENDATION 01/23/2020 SLP Diet Recommendations Regular solids;Thin liquid Liquid Administration via Cup;Straw Medication Administration Whole meds  with puree Compensations Slow rate;Small sips/bites Postural Changes Seated upright at 90 degrees   CHL IP OTHER RECOMMENDATIONS 01/23/2020 Recommended Consults Consider esophageal assessment Oral Care Recommendations Oral care BID Other Recommendations --   CHL IP FOLLOW UP RECOMMENDATIONS 01/23/2020 Follow up Recommendations None   No flowsheet data found.     CHL IP ORAL PHASE 01/23/2020 Oral Phase WFL Oral - Pudding Teaspoon -- Oral - Pudding Cup -- Oral - Honey Teaspoon -- Oral - Honey Cup -- Oral - Nectar Teaspoon -- Oral - Nectar Cup -- Oral - Nectar Straw -- Oral - Thin Teaspoon -- Oral - Thin Cup -- Oral - Thin Straw -- Oral - Puree -- Oral - Mech Soft -- Oral - Regular -- Oral - Multi-Consistency -- Oral - Pill -- Oral Phase - Comment --  CHL IP PHARYNGEAL PHASE 01/23/2020 Pharyngeal Phase WFL Pharyngeal- Pudding Teaspoon -- Pharyngeal -- Pharyngeal- Pudding Cup -- Pharyngeal -- Pharyngeal- Honey Teaspoon -- Pharyngeal -- Pharyngeal- Honey Cup -- Pharyngeal -- Pharyngeal- Nectar Teaspoon -- Pharyngeal -- Pharyngeal- Nectar Cup -- Pharyngeal -- Pharyngeal- Nectar Straw -- Pharyngeal -- Pharyngeal- Thin Teaspoon -- Pharyngeal -- Pharyngeal- Thin Cup -- Pharyngeal -- Pharyngeal- Thin Straw -- Pharyngeal -- Pharyngeal- Puree -- Pharyngeal -- Pharyngeal- Mechanical Soft -- Pharyngeal -- Pharyngeal- Regular -- Pharyngeal -- Pharyngeal- Multi-consistency -- Pharyngeal -- Pharyngeal- Pill -- Pharyngeal -- Pharyngeal Comment --  CHL IP CERVICAL ESOPHAGEAL PHASE 01/23/2020 Cervical Esophageal Phase (No Data) Pudding Teaspoon -- Pudding Cup -- Honey Teaspoon -- Honey Cup -- Nectar Teaspoon -- Nectar Cup -- Nectar Straw -- Thin Teaspoon -- Thin Cup -- Thin Straw -- Puree -- Mechanical Soft -- Regular -- Multi-consistency -- Pill See impressions Cervical Esophageal Comment -- Shanika I. Hardin Negus, [place], [place] Office number [phone] Pager [phone] Horton Marshall  01/23/2020, 11:33 AM               VAS [place] LOWER EXTREMITY VENOUS (DVT)  Result Date: 01/22/2020  Lower Venous DVT Study Indications: Pain, and Edema.  Comparison Study: No previous exam Performing Technologist: Vonzell Schlatter RVT  Examination Guidelines: A complete evaluation includes B-mode imaging, spectral Doppler, color Doppler, and power Doppler as needed of all accessible portions of each vessel. Bilateral testing is considered an integral part of a complete examination. Limited examinations for reoccurring indications may be performed as noted. The reflux portion of the exam is performed with the patient in reverse Trendelenburg.  +---------+---------------+---------+-----------+----------+--------------+ RIGHT    CompressibilityPhasicitySpontaneityPropertiesThrombus Aging +---------+---------------+---------+-----------+----------+--------------+ CFV      Full           Yes      Yes                                 +---------+---------------+---------+-----------+----------+--------------+ SFJ      Full                                                        +---------+---------------+---------+-----------+----------+--------------+ FV Prox  Full                                                        +---------+---------------+---------+-----------+----------+--------------+ FV Mid   Full                                                        +---------+---------------+---------+-----------+----------+--------------+ FV DistalFull                                                        +---------+---------------+---------+-----------+----------+--------------+ PFV      Full                                                        +---------+---------------+---------+-----------+----------+--------------+ POP      Full           Yes      Yes                                 +---------+---------------+---------+-----------+----------+--------------+ PTV      Full                                                         +---------+---------------+---------+-----------+----------+--------------+ PERO  Full                                                        +---------+---------------+---------+-----------+----------+--------------+   +---------+---------------+---------+-----------+----------+--------------+ LEFT     CompressibilityPhasicitySpontaneityPropertiesThrombus Aging +---------+---------------+---------+-----------+----------+--------------+ CFV      Full           Yes      Yes                                 +---------+---------------+---------+-----------+----------+--------------+ SFJ      Full                                                        +---------+---------------+---------+-----------+----------+--------------+ FV Prox  Full                                                        +---------+---------------+---------+-----------+----------+--------------+ FV Mid   Full                                                        +---------+---------------+---------+-----------+----------+--------------+ FV DistalFull                                                        +---------+---------------+---------+-----------+----------+--------------+ PFV      Full                                                        +---------+---------------+---------+-----------+----------+--------------+ POP      Full           Yes      Yes                                 +---------+---------------+---------+-----------+----------+--------------+ PTV      Full                                                        +---------+---------------+---------+-----------+----------+--------------+ PERO     Full                                                        +---------+---------------+---------+-----------+----------+--------------+  Summary: RIGHT: - There is no evidence of deep vein thrombosis in the lower  extremity.  - No cystic structure found in the popliteal fossa.  LEFT: - There is no evidence of deep vein thrombosis in the lower extremity.  - No cystic structure found in the popliteal fossa.  *See table(s) above for measurements and observations. Electronically signed by Servando Snare MD on 01/22/2020 at 3:08:09 PM.    Final        Subjective: Patient seen and examined.  Son at bedside.  Tells me that she feels better and denies chest pain, shortness of breath, palpitation, leg swelling, orthopnea or PND.  Tells me that her cough has improved and wishes to go home.  Discharge Exam: Vitals:   01/23/20 0504 01/23/20 0900  BP: (!) 151/55   Pulse: 73   Resp: 18   Temp: 97.8 F (36.6 C)   SpO2: 97% 96%   Vitals:   01/22/20 2024 01/23/20 0504 01/23/20 0800 01/23/20 0900  BP: (!) 128/46 (!) 151/55    Pulse: 83 73    Resp: 18 18    Temp: 98 F (36.7 C) 97.8 F (36.6 C)    TempSrc: Oral Oral Oral   SpO2: 97% 97%  96%  Weight:      Height:        General: Pt is alert, awake, not in acute distress, elderly looking, on 3 L of oxygen via nasal cannula, communicating well. Cardiovascular: RRR, S1/S2 +, no rubs, no gallops Respiratory: CTA bilaterally, no wheezing, no rhonchi Abdominal: Soft, NT, ND, bowel sounds + Extremities: no edema, no cyanosis    The results of significant diagnostics from this hospitalization (including imaging, microbiology, ancillary and laboratory) are listed below for reference.     Microbiology: Recent Results (from the past 240 hour(s))  Urine Culture     Status: Abnormal   Collection Time: 01/20/20  5:40 PM   Specimen: Urine, Random  Result Value Ref Range Status   Specimen Description URINE, RANDOM  Final   Special Requests   Final    NONE Performed at [facility], [address] [place], [place] [address]    Culture MULTIPLE SPECIES PRESENT, SUGGEST RECOLLECTION (A)  Final   Report Status 01/22/2020 FINAL  Final  Resp Panel by  RT-PCR (Flu A&B, Covid) Nasopharyngeal Swab     Status: None   Collection Time: 01/21/20  2:40 AM   Specimen: Nasopharyngeal Swab; Nasopharyngeal(NP) swabs in vial transport medium  Result Value Ref Range Status   SARS Coronavirus 2 by RT PCR NEGATIVE NEGATIVE Final    Comment: (NOTE) SARS-CoV-2 target nucleic acids are NOT DETECTED.  The SARS-CoV-2 RNA is generally detectable in upper respiratory specimens during the acute phase of infection. The lowest concentration of SARS-CoV-2 viral copies this assay can detect is 138 copies/mL. A negative result does not preclude SARS-Cov-2 infection and should not be used as the sole basis for treatment or other patient management decisions. A negative result may occur with  improper specimen collection/handling, submission of specimen other than nasopharyngeal swab, presence of viral mutation(s) within the areas targeted by this assay, and inadequate number of viral copies(<138 copies/mL). A negative result must be combined with clinical observations, patient history, and epidemiological information. The expected result is Negative.  Fact Sheet for Patients:  EntrepreneurPulse.com.au  Fact Sheet for Healthcare Providers:  IncredibleEmployment.be  This test is no t yet approved or cleared by the [place] and  has been authorized  for detection and/or diagnosis of SARS-CoV-2 by FDA under an Emergency Use Authorization (EUA). This EUA will remain  in effect (meaning this test can be used) for the duration of the COVID-19 declaration under Section 564(b)(1) of the Act, 21 U.S.C.section 360bbb-3(b)(1), unless the authorization is terminated  or revoked sooner.       Influenza A by PCR NEGATIVE NEGATIVE Final   Influenza B by PCR NEGATIVE NEGATIVE Final    Comment: (NOTE) The Xpert Xpress SARS-CoV-2/FLU/RSV plus assay is intended as an aid in the diagnosis of influenza from Nasopharyngeal swab  specimens and should not be used as a sole basis for treatment. Nasal washings and aspirates are unacceptable for Xpert Xpress SARS-CoV-2/FLU/RSV testing.  Fact Sheet for Patients: EntrepreneurPulse.com.au  Fact Sheet for Healthcare Providers: IncredibleEmployment.be  This test is not yet approved or cleared by the [place] FDA and has been authorized for detection and/or diagnosis of SARS-CoV-2 by FDA under an Emergency Use Authorization (EUA). This EUA will remain in effect (meaning this test can be used) for the duration of the COVID-19 declaration under Section 564(b)(1) of the Act, 21 U.S.C. section 360bbb-3(b)(1), unless the authorization is terminated or revoked.  Performed at [facility], [place] [address] [place], [place] [address]   Culture, sputum-assessment     Status: None   Collection Time: 01/21/20  5:42 AM   Specimen: Expectorated Sputum  Result Value Ref Range Status   Specimen Description EXPECTORATED SPUTUM  Final   Special Requests NONE  Final   Sputum evaluation   Final    Sputum specimen not acceptable for testing.  Please recollect.   RESULT CALLED TO, READ BACK BY AND VERIFIED WITH: RN Sallyanne Havers [phone] FCP Performed at [facility] [address], [place], [place] [address]    Report Status 01/21/2020 FINAL  Final     Labs: BNP (last 3 results) No results for input(s): BNP in the last 8760 hours. Basic Metabolic Panel: Recent Labs  Lab 01/20/20 1032 01/21/20 0639 01/22/20 0840 01/23/20 0225  NA 137 141 142 142  K 3.5 3.6 3.8 3.7  CL 101 105 104 106  CO2 24 22 26 26   GLUCOSE 107* 102* 88 92  BUN 21 25* 22 16  CREATININE 1.25* 1.17* 1.19* 1.21*  CALCIUM 9.2 8.6* 8.8* 8.8*  MG  --   --   --  2.1   Liver Function Tests: Recent Labs  Lab 01/20/20 1032 01/21/20 0639 01/23/20 0225  AST 14* 13* 14*  ALT 9 9 11   ALKPHOS 66 62 66  BILITOT 0.8 0.7 0.5  PROT 6.7 6.0* 5.8*   ALBUMIN 2.6* 2.2* 2.1*   Recent Labs  Lab 01/20/20 1032  LIPASE 21   No results for input(s): AMMONIA in the last 168 hours. CBC: Recent Labs  Lab 01/20/20 1032 01/21/20 0639 01/22/20 0840 01/23/20 0225  WBC 7.5 6.5 6.5 6.5  HGB 9.5* 8.5* 8.9* 8.4*  HCT 31.5* 28.3* 27.7* 28.1*  MCV 96.9 98.3 95.2 97.6  PLT 299 277 311 318   Cardiac Enzymes: No results for input(s): CKTOTAL, CKMB, CKMBINDEX, TROPONINI in the last 168 hours. BNP: Invalid input(s): POCBNP CBG: No results for input(s): GLUCAP in the last 168 hours. D-Dimer No results for input(s): DDIMER in the last 72 hours. Hgb A1c No results for input(s): HGBA1C in the last 72 hours. Lipid Profile No results for input(s): CHOL, HDL, LDLCALC, TRIG, CHOLHDL, LDLDIRECT in the last 72 hours. Thyroid function studies  No results for input(s): TSH, T4TOTAL, T3FREE, THYROIDAB in the last 72 hours.  Invalid input(s): FREET3 Anemia work up No results for input(s): VITAMINB12, FOLATE, FERRITIN, TIBC, IRON, RETICCTPCT in the last 72 hours. Urinalysis    Component Value Date/Time   COLORURINE AMBER (A) 01/20/2020 1746   APPEARANCEUR CLOUDY (A) 01/20/2020 1746   LABSPEC 1.020 01/20/2020 1746   PHURINE 5.0 01/20/2020 1746   GLUCOSEU NEGATIVE 01/20/2020 1746   HGBUR MODERATE (A) 01/20/2020 1746   BILIRUBINUR NEGATIVE 01/20/2020 1746   KETONESUR 5 (A) 01/20/2020 1746   PROTEINUR 30 (A) 01/20/2020 1746   NITRITE NEGATIVE 01/20/2020 1746   LEUKOCYTESUR LARGE (A) 01/20/2020 1746   Sepsis Labs Invalid input(s): PROCALCITONIN,  WBC,  LACTICIDVEN Microbiology Recent Results (from the past 240 hour(s))  Urine Culture     Status: Abnormal   Collection Time: 01/20/20  5:40 PM   Specimen: Urine, Random  Result Value Ref Range Status   Specimen Description URINE, RANDOM  Final   Special Requests   Final    NONE Performed at [facility], [place] [address] [place], [place] [address]    Culture MULTIPLE SPECIES PRESENT,  SUGGEST RECOLLECTION (A)  Final   Report Status 01/22/2020 FINAL  Final  Resp Panel by RT-PCR (Flu A&B, Covid) Nasopharyngeal Swab     Status: None   Collection Time: 01/21/20  2:40 AM   Specimen: Nasopharyngeal Swab; Nasopharyngeal(NP) swabs in vial transport medium  Result Value Ref Range Status   SARS Coronavirus 2 by RT PCR NEGATIVE NEGATIVE Final    Comment: (NOTE) SARS-CoV-2 target nucleic acids are NOT DETECTED.  The SARS-CoV-2 RNA is generally detectable in upper respiratory specimens during the acute phase of infection. The lowest concentration of SARS-CoV-2 viral copies this assay can detect is 138 copies/mL. A negative result does not preclude SARS-Cov-2 infection and should not be used as the sole basis for treatment or other patient management decisions. A negative result may occur with  improper specimen collection/handling, submission of specimen other than nasopharyngeal swab, presence of viral mutation(s) within the areas targeted by this assay, and inadequate number of viral copies(<138 copies/mL). A negative result must be combined with clinical observations, patient history, and epidemiological information. The expected result is Negative.  Fact Sheet for Patients:  EntrepreneurPulse.com.au  Fact Sheet for Healthcare Providers:  IncredibleEmployment.be  This test is no t yet approved or cleared by the [place] FDA and  has been authorized for detection and/or diagnosis of SARS-CoV-2 by FDA under an Emergency Use Authorization (EUA). This EUA will remain  in effect (meaning this test can be used) for the duration of the COVID-19 declaration under Section 564(b)(1) of the Act, 21 U.S.C.section 360bbb-3(b)(1), unless the authorization is terminated  or revoked sooner.       Influenza A by PCR NEGATIVE NEGATIVE Final   Influenza B by PCR NEGATIVE NEGATIVE Final    Comment: (NOTE) The Xpert Xpress SARS-CoV-2/FLU/RSV plus  assay is intended as an aid in the diagnosis of influenza from Nasopharyngeal swab specimens and should not be used as a sole basis for treatment. Nasal washings and aspirates are unacceptable for Xpert Xpress SARS-CoV-2/FLU/RSV testing.  Fact Sheet for Patients: EntrepreneurPulse.com.au  Fact Sheet for Healthcare Providers: IncredibleEmployment.be  This test is not yet approved or cleared by the [place] FDA and has been authorized for detection and/or diagnosis of SARS-CoV-2 by FDA under an Emergency Use Authorization (EUA). This EUA will remain in effect (meaning this test can be  used) for the duration of the COVID-19 declaration under Section 564(b)(1) of the Act, 21 U.S.C. section 360bbb-3(b)(1), unless the authorization is terminated or revoked.  Performed at [facility], [place] [address], [place], [place] [address]   Culture, sputum-assessment     Status: None   Collection Time: 01/21/20  5:42 AM   Specimen: Expectorated Sputum  Result Value Ref Range Status   Specimen Description EXPECTORATED SPUTUM  Final   Special Requests NONE  Final   Sputum evaluation   Final    Sputum specimen not acceptable for testing.  Please recollect.   RESULT CALLED TO, READ BACK BY AND VERIFIED WITH: RN Sallyanne Havers [phone] FCP Performed at [facility] [address] [place], [place] [address]    Report Status 01/21/2020 FINAL  Final     Time coordinating discharge: Over 30 minutes  SIGNED:   Mckinley Jewel, MD  [facility] 01/23/2020, 1:05 PM Pager   If 7PM-7AM, please contact night-coverage [email or web address]

## 2020-01-23 NOTE — TOC Transition Note (Signed)
Transition of Care [facility]) - CM/SW Discharge Note   Patient Details  Name: Courtney Cline MRN: [id number] Date of Birth: March 04, 1933  Transition of Care [facility]) CM/SW Contact:  Joanne Chars, LCSW Phone Number: 01/23/2020, 1:27 PM   Clinical Narrative:   Pt discharging home today with [facility] services through [place].  Home O2 and 3n1 provided by [employer].  Son will transport home and has O2 for this transportation.     Final next level of care: [place] Barriers to Discharge: Barriers Resolved   Patient Goals and CMS Choice Patient states their goals for this hospitalization and ongoing recovery are:: "get back to how I was before I was sick" CMS Medicare.gov Compare Post Acute Care list provided to:: Patient Choice offered to / list presented to : Patient  Discharge Placement                       Discharge Plan and Services     Post Acute Care Choice: Home Health          DME Arranged: 3-N-1,Oxygen DME Agency: [employer] Date DME Agency Contacted: 01/23/20 Time DME Agency Contacted: 1118 Representative spoke with at DME Agency: [place]: PT,OT [place]: [place] Date [place]: 01/23/20 Time [place]: 1054 Representative spoke with at [place]: [place] ([place]) Interventions     Readmission Risk Interventions No flowsheet data found.

## 2020-01-23 NOTE — Progress Notes (Signed)
Pt discharged home.  All discharge instructions reviewed including medications and follow-up care appointments.  NAD

## 2020-01-26 DIAGNOSIS — J189 Pneumonia, unspecified organism: Secondary | ICD-10-CM | POA: Diagnosis not present

## 2020-01-26 DIAGNOSIS — J432 Centrilobular emphysema: Secondary | ICD-10-CM | POA: Diagnosis not present

## 2020-01-26 DIAGNOSIS — I0981 Rheumatic heart failure: Secondary | ICD-10-CM | POA: Diagnosis not present

## 2020-01-26 DIAGNOSIS — N390 Urinary tract infection, site not specified: Secondary | ICD-10-CM | POA: Diagnosis not present

## 2020-01-26 DIAGNOSIS — N1832 Chronic kidney disease, stage 3b: Secondary | ICD-10-CM | POA: Diagnosis not present

## 2020-01-26 DIAGNOSIS — I5032 Chronic diastolic (congestive) heart failure: Secondary | ICD-10-CM | POA: Diagnosis not present

## 2020-01-26 DIAGNOSIS — I2510 Atherosclerotic heart disease of native coronary artery without angina pectoris: Secondary | ICD-10-CM | POA: Diagnosis not present

## 2020-01-26 DIAGNOSIS — D631 Anemia in chronic kidney disease: Secondary | ICD-10-CM | POA: Diagnosis not present

## 2020-01-26 DIAGNOSIS — I130 Hypertensive heart and chronic kidney disease with heart failure and stage 1 through stage 4 chronic kidney disease, or unspecified chronic kidney disease: Secondary | ICD-10-CM | POA: Diagnosis not present

## 2020-01-27 DIAGNOSIS — J189 Pneumonia, unspecified organism: Secondary | ICD-10-CM | POA: Diagnosis not present

## 2020-01-27 DIAGNOSIS — N390 Urinary tract infection, site not specified: Secondary | ICD-10-CM | POA: Diagnosis not present

## 2020-01-27 DIAGNOSIS — I0981 Rheumatic heart failure: Secondary | ICD-10-CM | POA: Diagnosis not present

## 2020-01-27 DIAGNOSIS — J432 Centrilobular emphysema: Secondary | ICD-10-CM | POA: Diagnosis not present

## 2020-01-27 DIAGNOSIS — N1832 Chronic kidney disease, stage 3b: Secondary | ICD-10-CM | POA: Diagnosis not present

## 2020-01-27 DIAGNOSIS — I2510 Atherosclerotic heart disease of native coronary artery without angina pectoris: Secondary | ICD-10-CM | POA: Diagnosis not present

## 2020-01-27 DIAGNOSIS — I130 Hypertensive heart and chronic kidney disease with heart failure and stage 1 through stage 4 chronic kidney disease, or unspecified chronic kidney disease: Secondary | ICD-10-CM | POA: Diagnosis not present

## 2020-01-27 DIAGNOSIS — I5032 Chronic diastolic (congestive) heart failure: Secondary | ICD-10-CM | POA: Diagnosis not present

## 2020-01-27 DIAGNOSIS — D631 Anemia in chronic kidney disease: Secondary | ICD-10-CM | POA: Diagnosis not present

## 2020-01-29 DIAGNOSIS — I130 Hypertensive heart and chronic kidney disease with heart failure and stage 1 through stage 4 chronic kidney disease, or unspecified chronic kidney disease: Secondary | ICD-10-CM | POA: Diagnosis not present

## 2020-01-29 DIAGNOSIS — I2510 Atherosclerotic heart disease of native coronary artery without angina pectoris: Secondary | ICD-10-CM | POA: Diagnosis not present

## 2020-01-29 DIAGNOSIS — D631 Anemia in chronic kidney disease: Secondary | ICD-10-CM | POA: Diagnosis not present

## 2020-01-29 DIAGNOSIS — I0981 Rheumatic heart failure: Secondary | ICD-10-CM | POA: Diagnosis not present

## 2020-01-29 DIAGNOSIS — N390 Urinary tract infection, site not specified: Secondary | ICD-10-CM | POA: Diagnosis not present

## 2020-01-29 DIAGNOSIS — I5032 Chronic diastolic (congestive) heart failure: Secondary | ICD-10-CM | POA: Diagnosis not present

## 2020-01-29 DIAGNOSIS — J189 Pneumonia, unspecified organism: Secondary | ICD-10-CM | POA: Diagnosis not present

## 2020-01-29 DIAGNOSIS — N1832 Chronic kidney disease, stage 3b: Secondary | ICD-10-CM | POA: Diagnosis not present

## 2020-01-29 DIAGNOSIS — J432 Centrilobular emphysema: Secondary | ICD-10-CM | POA: Diagnosis not present

## 2020-01-30 DIAGNOSIS — D509 Iron deficiency anemia, unspecified: Secondary | ICD-10-CM | POA: Diagnosis not present

## 2020-01-30 DIAGNOSIS — I10 Essential (primary) hypertension: Secondary | ICD-10-CM | POA: Diagnosis not present

## 2020-01-30 DIAGNOSIS — J189 Pneumonia, unspecified organism: Secondary | ICD-10-CM | POA: Diagnosis not present

## 2020-01-30 DIAGNOSIS — I480 Paroxysmal atrial fibrillation: Secondary | ICD-10-CM | POA: Diagnosis not present

## 2020-01-30 DIAGNOSIS — I5032 Chronic diastolic (congestive) heart failure: Secondary | ICD-10-CM | POA: Diagnosis not present

## 2020-01-30 DIAGNOSIS — N390 Urinary tract infection, site not specified: Secondary | ICD-10-CM | POA: Diagnosis not present

## 2020-01-30 DIAGNOSIS — D500 Iron deficiency anemia secondary to blood loss (chronic): Secondary | ICD-10-CM | POA: Diagnosis not present

## 2020-01-30 DIAGNOSIS — J449 Chronic obstructive pulmonary disease, unspecified: Secondary | ICD-10-CM | POA: Diagnosis not present

## 2020-01-30 DIAGNOSIS — D0511 Intraductal carcinoma in situ of right breast: Secondary | ICD-10-CM | POA: Diagnosis not present

## 2020-01-30 DIAGNOSIS — E785 Hyperlipidemia, unspecified: Secondary | ICD-10-CM | POA: Diagnosis not present

## 2020-01-30 DIAGNOSIS — K219 Gastro-esophageal reflux disease without esophagitis: Secondary | ICD-10-CM | POA: Diagnosis not present

## 2020-01-31 DIAGNOSIS — J432 Centrilobular emphysema: Secondary | ICD-10-CM | POA: Diagnosis not present

## 2020-01-31 DIAGNOSIS — I5032 Chronic diastolic (congestive) heart failure: Secondary | ICD-10-CM | POA: Diagnosis not present

## 2020-01-31 DIAGNOSIS — I0981 Rheumatic heart failure: Secondary | ICD-10-CM | POA: Diagnosis not present

## 2020-01-31 DIAGNOSIS — I2510 Atherosclerotic heart disease of native coronary artery without angina pectoris: Secondary | ICD-10-CM | POA: Diagnosis not present

## 2020-01-31 DIAGNOSIS — J189 Pneumonia, unspecified organism: Secondary | ICD-10-CM | POA: Diagnosis not present

## 2020-01-31 DIAGNOSIS — N1832 Chronic kidney disease, stage 3b: Secondary | ICD-10-CM | POA: Diagnosis not present

## 2020-01-31 DIAGNOSIS — I130 Hypertensive heart and chronic kidney disease with heart failure and stage 1 through stage 4 chronic kidney disease, or unspecified chronic kidney disease: Secondary | ICD-10-CM | POA: Diagnosis not present

## 2020-01-31 DIAGNOSIS — D631 Anemia in chronic kidney disease: Secondary | ICD-10-CM | POA: Diagnosis not present

## 2020-01-31 DIAGNOSIS — N390 Urinary tract infection, site not specified: Secondary | ICD-10-CM | POA: Diagnosis not present

## 2020-02-02 DIAGNOSIS — R0902 Hypoxemia: Secondary | ICD-10-CM | POA: Diagnosis not present

## 2020-02-02 DIAGNOSIS — J449 Chronic obstructive pulmonary disease, unspecified: Secondary | ICD-10-CM | POA: Diagnosis not present

## 2020-02-02 DIAGNOSIS — J9610 Chronic respiratory failure, unspecified whether with hypoxia or hypercapnia: Secondary | ICD-10-CM | POA: Diagnosis not present

## 2020-02-04 DIAGNOSIS — I2510 Atherosclerotic heart disease of native coronary artery without angina pectoris: Secondary | ICD-10-CM | POA: Diagnosis not present

## 2020-02-04 DIAGNOSIS — J189 Pneumonia, unspecified organism: Secondary | ICD-10-CM | POA: Diagnosis not present

## 2020-02-04 DIAGNOSIS — I130 Hypertensive heart and chronic kidney disease with heart failure and stage 1 through stage 4 chronic kidney disease, or unspecified chronic kidney disease: Secondary | ICD-10-CM | POA: Diagnosis not present

## 2020-02-04 DIAGNOSIS — J432 Centrilobular emphysema: Secondary | ICD-10-CM | POA: Diagnosis not present

## 2020-02-04 DIAGNOSIS — N390 Urinary tract infection, site not specified: Secondary | ICD-10-CM | POA: Diagnosis not present

## 2020-02-04 DIAGNOSIS — I0981 Rheumatic heart failure: Secondary | ICD-10-CM | POA: Diagnosis not present

## 2020-02-04 DIAGNOSIS — N1832 Chronic kidney disease, stage 3b: Secondary | ICD-10-CM | POA: Diagnosis not present

## 2020-02-04 DIAGNOSIS — I5032 Chronic diastolic (congestive) heart failure: Secondary | ICD-10-CM | POA: Diagnosis not present

## 2020-02-04 DIAGNOSIS — D631 Anemia in chronic kidney disease: Secondary | ICD-10-CM | POA: Diagnosis not present

## 2020-02-05 DIAGNOSIS — I5032 Chronic diastolic (congestive) heart failure: Secondary | ICD-10-CM | POA: Diagnosis not present

## 2020-02-05 DIAGNOSIS — I0981 Rheumatic heart failure: Secondary | ICD-10-CM | POA: Diagnosis not present

## 2020-02-05 DIAGNOSIS — D631 Anemia in chronic kidney disease: Secondary | ICD-10-CM | POA: Diagnosis not present

## 2020-02-05 DIAGNOSIS — I130 Hypertensive heart and chronic kidney disease with heart failure and stage 1 through stage 4 chronic kidney disease, or unspecified chronic kidney disease: Secondary | ICD-10-CM | POA: Diagnosis not present

## 2020-02-05 DIAGNOSIS — J189 Pneumonia, unspecified organism: Secondary | ICD-10-CM | POA: Diagnosis not present

## 2020-02-05 DIAGNOSIS — I2510 Atherosclerotic heart disease of native coronary artery without angina pectoris: Secondary | ICD-10-CM | POA: Diagnosis not present

## 2020-02-05 DIAGNOSIS — J432 Centrilobular emphysema: Secondary | ICD-10-CM | POA: Diagnosis not present

## 2020-02-05 DIAGNOSIS — N1832 Chronic kidney disease, stage 3b: Secondary | ICD-10-CM | POA: Diagnosis not present

## 2020-02-05 DIAGNOSIS — N390 Urinary tract infection, site not specified: Secondary | ICD-10-CM | POA: Diagnosis not present

## 2020-02-06 DIAGNOSIS — I5032 Chronic diastolic (congestive) heart failure: Secondary | ICD-10-CM | POA: Diagnosis not present

## 2020-02-06 DIAGNOSIS — J189 Pneumonia, unspecified organism: Secondary | ICD-10-CM | POA: Diagnosis not present

## 2020-02-06 DIAGNOSIS — N390 Urinary tract infection, site not specified: Secondary | ICD-10-CM | POA: Diagnosis not present

## 2020-02-06 DIAGNOSIS — I2510 Atherosclerotic heart disease of native coronary artery without angina pectoris: Secondary | ICD-10-CM | POA: Diagnosis not present

## 2020-02-06 DIAGNOSIS — D631 Anemia in chronic kidney disease: Secondary | ICD-10-CM | POA: Diagnosis not present

## 2020-02-06 DIAGNOSIS — N1832 Chronic kidney disease, stage 3b: Secondary | ICD-10-CM | POA: Diagnosis not present

## 2020-02-06 DIAGNOSIS — J432 Centrilobular emphysema: Secondary | ICD-10-CM | POA: Diagnosis not present

## 2020-02-06 DIAGNOSIS — I0981 Rheumatic heart failure: Secondary | ICD-10-CM | POA: Diagnosis not present

## 2020-02-06 DIAGNOSIS — I130 Hypertensive heart and chronic kidney disease with heart failure and stage 1 through stage 4 chronic kidney disease, or unspecified chronic kidney disease: Secondary | ICD-10-CM | POA: Diagnosis not present

## 2020-02-10 DIAGNOSIS — I5032 Chronic diastolic (congestive) heart failure: Secondary | ICD-10-CM | POA: Diagnosis not present

## 2020-02-10 DIAGNOSIS — I130 Hypertensive heart and chronic kidney disease with heart failure and stage 1 through stage 4 chronic kidney disease, or unspecified chronic kidney disease: Secondary | ICD-10-CM | POA: Diagnosis not present

## 2020-02-10 DIAGNOSIS — I0981 Rheumatic heart failure: Secondary | ICD-10-CM | POA: Diagnosis not present

## 2020-02-10 DIAGNOSIS — J432 Centrilobular emphysema: Secondary | ICD-10-CM | POA: Diagnosis not present

## 2020-02-10 DIAGNOSIS — I2510 Atherosclerotic heart disease of native coronary artery without angina pectoris: Secondary | ICD-10-CM | POA: Diagnosis not present

## 2020-02-10 DIAGNOSIS — J189 Pneumonia, unspecified organism: Secondary | ICD-10-CM | POA: Diagnosis not present

## 2020-02-10 DIAGNOSIS — N1832 Chronic kidney disease, stage 3b: Secondary | ICD-10-CM | POA: Diagnosis not present

## 2020-02-10 DIAGNOSIS — N390 Urinary tract infection, site not specified: Secondary | ICD-10-CM | POA: Diagnosis not present

## 2020-02-10 DIAGNOSIS — D631 Anemia in chronic kidney disease: Secondary | ICD-10-CM | POA: Diagnosis not present

## 2020-02-12 DIAGNOSIS — J189 Pneumonia, unspecified organism: Secondary | ICD-10-CM | POA: Diagnosis not present

## 2020-02-12 DIAGNOSIS — I5032 Chronic diastolic (congestive) heart failure: Secondary | ICD-10-CM | POA: Diagnosis not present

## 2020-02-12 DIAGNOSIS — N1832 Chronic kidney disease, stage 3b: Secondary | ICD-10-CM | POA: Diagnosis not present

## 2020-02-12 DIAGNOSIS — I0981 Rheumatic heart failure: Secondary | ICD-10-CM | POA: Diagnosis not present

## 2020-02-12 DIAGNOSIS — D631 Anemia in chronic kidney disease: Secondary | ICD-10-CM | POA: Diagnosis not present

## 2020-02-12 DIAGNOSIS — J432 Centrilobular emphysema: Secondary | ICD-10-CM | POA: Diagnosis not present

## 2020-02-12 DIAGNOSIS — I130 Hypertensive heart and chronic kidney disease with heart failure and stage 1 through stage 4 chronic kidney disease, or unspecified chronic kidney disease: Secondary | ICD-10-CM | POA: Diagnosis not present

## 2020-02-12 DIAGNOSIS — N390 Urinary tract infection, site not specified: Secondary | ICD-10-CM | POA: Diagnosis not present

## 2020-02-12 DIAGNOSIS — I2510 Atherosclerotic heart disease of native coronary artery without angina pectoris: Secondary | ICD-10-CM | POA: Diagnosis not present

## 2020-02-13 DIAGNOSIS — J432 Centrilobular emphysema: Secondary | ICD-10-CM | POA: Diagnosis not present

## 2020-02-13 DIAGNOSIS — I0981 Rheumatic heart failure: Secondary | ICD-10-CM | POA: Diagnosis not present

## 2020-02-13 DIAGNOSIS — N1832 Chronic kidney disease, stage 3b: Secondary | ICD-10-CM | POA: Diagnosis not present

## 2020-02-13 DIAGNOSIS — N390 Urinary tract infection, site not specified: Secondary | ICD-10-CM | POA: Diagnosis not present

## 2020-02-13 DIAGNOSIS — I130 Hypertensive heart and chronic kidney disease with heart failure and stage 1 through stage 4 chronic kidney disease, or unspecified chronic kidney disease: Secondary | ICD-10-CM | POA: Diagnosis not present

## 2020-02-13 DIAGNOSIS — I2510 Atherosclerotic heart disease of native coronary artery without angina pectoris: Secondary | ICD-10-CM | POA: Diagnosis not present

## 2020-02-13 DIAGNOSIS — D631 Anemia in chronic kidney disease: Secondary | ICD-10-CM | POA: Diagnosis not present

## 2020-02-13 DIAGNOSIS — J189 Pneumonia, unspecified organism: Secondary | ICD-10-CM | POA: Diagnosis not present

## 2020-02-13 DIAGNOSIS — I5032 Chronic diastolic (congestive) heart failure: Secondary | ICD-10-CM | POA: Diagnosis not present

## 2020-02-18 DIAGNOSIS — J432 Centrilobular emphysema: Secondary | ICD-10-CM | POA: Diagnosis not present

## 2020-02-18 DIAGNOSIS — I5032 Chronic diastolic (congestive) heart failure: Secondary | ICD-10-CM | POA: Diagnosis not present

## 2020-02-18 DIAGNOSIS — I0981 Rheumatic heart failure: Secondary | ICD-10-CM | POA: Diagnosis not present

## 2020-02-18 DIAGNOSIS — J189 Pneumonia, unspecified organism: Secondary | ICD-10-CM | POA: Diagnosis not present

## 2020-02-18 DIAGNOSIS — N1832 Chronic kidney disease, stage 3b: Secondary | ICD-10-CM | POA: Diagnosis not present

## 2020-02-18 DIAGNOSIS — I2510 Atherosclerotic heart disease of native coronary artery without angina pectoris: Secondary | ICD-10-CM | POA: Diagnosis not present

## 2020-02-18 DIAGNOSIS — N390 Urinary tract infection, site not specified: Secondary | ICD-10-CM | POA: Diagnosis not present

## 2020-02-18 DIAGNOSIS — D631 Anemia in chronic kidney disease: Secondary | ICD-10-CM | POA: Diagnosis not present

## 2020-02-18 DIAGNOSIS — I130 Hypertensive heart and chronic kidney disease with heart failure and stage 1 through stage 4 chronic kidney disease, or unspecified chronic kidney disease: Secondary | ICD-10-CM | POA: Diagnosis not present

## 2020-02-19 DIAGNOSIS — D631 Anemia in chronic kidney disease: Secondary | ICD-10-CM | POA: Diagnosis not present

## 2020-02-19 DIAGNOSIS — N1832 Chronic kidney disease, stage 3b: Secondary | ICD-10-CM | POA: Diagnosis not present

## 2020-02-19 DIAGNOSIS — I130 Hypertensive heart and chronic kidney disease with heart failure and stage 1 through stage 4 chronic kidney disease, or unspecified chronic kidney disease: Secondary | ICD-10-CM | POA: Diagnosis not present

## 2020-02-19 DIAGNOSIS — I0981 Rheumatic heart failure: Secondary | ICD-10-CM | POA: Diagnosis not present

## 2020-02-19 DIAGNOSIS — N390 Urinary tract infection, site not specified: Secondary | ICD-10-CM | POA: Diagnosis not present

## 2020-02-19 DIAGNOSIS — J432 Centrilobular emphysema: Secondary | ICD-10-CM | POA: Diagnosis not present

## 2020-02-19 DIAGNOSIS — J189 Pneumonia, unspecified organism: Secondary | ICD-10-CM | POA: Diagnosis not present

## 2020-02-19 DIAGNOSIS — I2510 Atherosclerotic heart disease of native coronary artery without angina pectoris: Secondary | ICD-10-CM | POA: Diagnosis not present

## 2020-02-19 DIAGNOSIS — I5032 Chronic diastolic (congestive) heart failure: Secondary | ICD-10-CM | POA: Diagnosis not present

## 2020-02-20 DIAGNOSIS — J189 Pneumonia, unspecified organism: Secondary | ICD-10-CM | POA: Diagnosis not present

## 2020-02-20 DIAGNOSIS — N1832 Chronic kidney disease, stage 3b: Secondary | ICD-10-CM | POA: Diagnosis not present

## 2020-02-20 DIAGNOSIS — I130 Hypertensive heart and chronic kidney disease with heart failure and stage 1 through stage 4 chronic kidney disease, or unspecified chronic kidney disease: Secondary | ICD-10-CM | POA: Diagnosis not present

## 2020-02-20 DIAGNOSIS — N390 Urinary tract infection, site not specified: Secondary | ICD-10-CM | POA: Diagnosis not present

## 2020-02-20 DIAGNOSIS — I2510 Atherosclerotic heart disease of native coronary artery without angina pectoris: Secondary | ICD-10-CM | POA: Diagnosis not present

## 2020-02-20 DIAGNOSIS — I5032 Chronic diastolic (congestive) heart failure: Secondary | ICD-10-CM | POA: Diagnosis not present

## 2020-02-20 DIAGNOSIS — J432 Centrilobular emphysema: Secondary | ICD-10-CM | POA: Diagnosis not present

## 2020-02-20 DIAGNOSIS — I0981 Rheumatic heart failure: Secondary | ICD-10-CM | POA: Diagnosis not present

## 2020-02-20 DIAGNOSIS — D631 Anemia in chronic kidney disease: Secondary | ICD-10-CM | POA: Diagnosis not present

## 2020-02-26 DIAGNOSIS — N1832 Chronic kidney disease, stage 3b: Secondary | ICD-10-CM | POA: Diagnosis not present

## 2020-02-26 DIAGNOSIS — J432 Centrilobular emphysema: Secondary | ICD-10-CM | POA: Diagnosis not present

## 2020-02-26 DIAGNOSIS — N289 Disorder of kidney and ureter, unspecified: Secondary | ICD-10-CM | POA: Diagnosis not present

## 2020-02-26 DIAGNOSIS — I480 Paroxysmal atrial fibrillation: Secondary | ICD-10-CM | POA: Diagnosis not present

## 2020-02-26 DIAGNOSIS — N390 Urinary tract infection, site not specified: Secondary | ICD-10-CM | POA: Diagnosis not present

## 2020-02-26 DIAGNOSIS — Z1389 Encounter for screening for other disorder: Secondary | ICD-10-CM | POA: Diagnosis not present

## 2020-02-26 DIAGNOSIS — D631 Anemia in chronic kidney disease: Secondary | ICD-10-CM | POA: Diagnosis not present

## 2020-02-26 DIAGNOSIS — D0511 Intraductal carcinoma in situ of right breast: Secondary | ICD-10-CM | POA: Diagnosis not present

## 2020-02-26 DIAGNOSIS — M8588 Other specified disorders of bone density and structure, other site: Secondary | ICD-10-CM | POA: Diagnosis not present

## 2020-02-26 DIAGNOSIS — J189 Pneumonia, unspecified organism: Secondary | ICD-10-CM | POA: Diagnosis not present

## 2020-02-26 DIAGNOSIS — I130 Hypertensive heart and chronic kidney disease with heart failure and stage 1 through stage 4 chronic kidney disease, or unspecified chronic kidney disease: Secondary | ICD-10-CM | POA: Diagnosis not present

## 2020-02-26 DIAGNOSIS — I10 Essential (primary) hypertension: Secondary | ICD-10-CM | POA: Diagnosis not present

## 2020-02-26 DIAGNOSIS — E785 Hyperlipidemia, unspecified: Secondary | ICD-10-CM | POA: Diagnosis not present

## 2020-02-26 DIAGNOSIS — I5032 Chronic diastolic (congestive) heart failure: Secondary | ICD-10-CM | POA: Diagnosis not present

## 2020-02-26 DIAGNOSIS — I0981 Rheumatic heart failure: Secondary | ICD-10-CM | POA: Diagnosis not present

## 2020-02-26 DIAGNOSIS — Z0000 Encounter for general adult medical examination without abnormal findings: Secondary | ICD-10-CM | POA: Diagnosis not present

## 2020-02-26 DIAGNOSIS — I2510 Atherosclerotic heart disease of native coronary artery without angina pectoris: Secondary | ICD-10-CM | POA: Diagnosis not present

## 2020-02-26 DIAGNOSIS — D509 Iron deficiency anemia, unspecified: Secondary | ICD-10-CM | POA: Diagnosis not present

## 2020-03-02 ENCOUNTER — Encounter: Payer: Self-pay | Admitting: Cardiology

## 2020-03-02 ENCOUNTER — Telehealth (INDEPENDENT_AMBULATORY_CARE_PROVIDER_SITE_OTHER): Payer: Medicare HMO | Admitting: Cardiology

## 2020-03-02 VITALS — BP 115/52 | HR 71 | Ht 60.0 in | Wt 163.0 lb

## 2020-03-02 DIAGNOSIS — J9611 Chronic respiratory failure with hypoxia: Secondary | ICD-10-CM | POA: Diagnosis not present

## 2020-03-02 DIAGNOSIS — I10 Essential (primary) hypertension: Secondary | ICD-10-CM

## 2020-03-02 DIAGNOSIS — I771 Stricture of artery: Secondary | ICD-10-CM | POA: Diagnosis not present

## 2020-03-02 DIAGNOSIS — J189 Pneumonia, unspecified organism: Secondary | ICD-10-CM | POA: Diagnosis not present

## 2020-03-02 DIAGNOSIS — I5032 Chronic diastolic (congestive) heart failure: Secondary | ICD-10-CM

## 2020-03-02 DIAGNOSIS — I4819 Other persistent atrial fibrillation: Secondary | ICD-10-CM | POA: Diagnosis not present

## 2020-03-02 NOTE — Progress Notes (Signed)
Virtual Visit via Telephone Note   This visit type was conducted due to national recommendations for restrictions regarding the COVID-19 Pandemic (e.g. social distancing) in an effort to limit this patient's exposure and mitigate transmission in our community.  Due to her co-morbid illnesses, this patient is at least at moderate risk for complications without adequate follow up.  This format is felt to be most appropriate for this patient at this time.  The patient did not have access to video technology/had technical difficulties with video requiring transitioning to audio format only (telephone).  All issues noted in this document were discussed and addressed.  No physical exam could be performed with this format.  Please refer to the patient's chart for her  consent to telehealth for [facility].    Date:  03/02/2020   ID:  [name], DOB 1933/12/18, MRN [id number] The patient was identified using 2 identifiers.  Patient Location: Home Provider Location: Home Office  PCP:  Leeroy Cha, MD  Cardiologist:  Dr Gwenlyn Found Electrophysiologist:  None   Evaluation Performed:  Follow-Up Visit  Chief Complaint:  none  History of Present Illness:    Morna Flud is a pleasant 87 y.o. female with a history of hypertension, COPD on home O2, chronic atrial fibrillation on Eliquis, chronic renal insufficiency stage III, chronic anemia, and a history of asymptomatic left subclavian artery stenosis.    The patient was admitted in December 2021 with community-acquired pneumonia which was COVID-negative and a UTI.  She was contacted today for routine follow-up.  She lives at home with her husband, they have 2 sons nearby that check on them frequently.  Since discharge she has done well.  She denies any issues with her medications.  Her breathing is at baseline.  She is unaware of her atrial fibrillation.  Her vital signs are stable.  She has had issues with lower extremity  edema in the past and she says this is stable as well.  The patient does not have symptoms concerning for COVID-19 infection (fever, chills, cough, or new shortness of breath).    Past Medical History:  Diagnosis Date  . Allergic rhinitis   . Arthritis   . Breast calcification seen on mammogram    Right breast  . Breast cancer ([place])   . COPD (chronic obstructive pulmonary disease) ([place])   . GERD (gastroesophageal reflux disease)   . Hyperglycemia   . Hyperlipidemia   . Hypertension   . Low back pain   . On home oxygen therapy    all the time  . Osteopenia   . Peripheral edema   . Shortness of breath   . Subclavian artery stenosis, left ([place])   . Vitamin D deficiency   . Wears dentures    top  . Wears glasses    reading   Past Surgical History:  Procedure Laterality Date  . BREAST BIOPSY    . BREAST LUMPECTOMY     right 2015  . BREAST LUMPECTOMY WITH RADIOACTIVE SEED LOCALIZATION Right 12/20/2013   Procedure: RIGHT BREAST LUMPECTOMY WITH RADIOACTIVE SEED LOCALIZATION;  Surgeon: Erroll Luna, MD;  Location: [facility];  Service: General;  Laterality: Right;  . CAROTID DUPLEX  2014  . CATARACT EXTRACTION     both eyes     Current Meds  Medication Sig  . acetaminophen (TYLENOL) 325 MG tablet Take 650 mg by mouth every 6 (six) hours as needed for mild pain or headache.   Marland Kitchen apixaban (ELIQUIS)  5 MG TABS tablet Take 1 tablet (5 mg total) by mouth 2 (two) times daily.  Marland Kitchen azelastine (ASTELIN) 0.1 % nasal spray Place 2 sprays into both nostrils 2 (two) times daily. Use in each nostril as directed (Patient taking differently: Place 2 sprays into both nostrils 2 (two) times daily as needed for rhinitis.)  . Cholecalciferol (VITAMIN D) 2000 UNITS CAPS Take 2,000 Units by mouth daily.   . Cyanocobalamin (VITAMIN B 12 PO) Take 1 tablet by mouth daily.  . DiphenhydrAMINE HCl (BENADRYL PO) Take 1 tablet by mouth daily as needed (allergy).   . famotidine (PEPCID) 20 MG  tablet Take 20 mg by mouth every other day.   . Fluticasone-Umeclidin-Vilant (TRELEGY ELLIPTA) 100-62.5-25 MCG/INH AEPB Inhale 1 puff into the lungs daily.  . furosemide (LASIX) 40 MG tablet Take 1 tablet (40 mg total) by mouth 2 (two) times daily. <PLEASE MAKE APPOINTMENT FOR REFILLS> (Patient taking differently: Take 40 mg by mouth every other day.)  . KLOR-CON M20 20 MEQ tablet TAKE 1 TABLET BY MOUTH DAILY (Patient taking differently: Take 20 mEq by mouth daily.)  . lisinopril (PRINIVIL,ZESTRIL) 40 MG tablet Take 40 mg by mouth daily.  Marland Kitchen lovastatin (MEVACOR) 40 MG tablet Take 40 mg by mouth at bedtime.  . metoprolol tartrate (LOPRESSOR) 25 MG tablet Take 1 tablet (25 mg total) by mouth 2 (two) times daily.  Marland Kitchen omeprazole (PRILOSEC) 20 MG capsule Take 20 mg by mouth every other day.   Marland Kitchen PROAIR HFA 108 (90 Base) MCG/ACT inhaler TAKE 2 PUFFS BY MOUTH EVERY 6 HOURS AS NEEDED FOR WHEEZE OR SHORTNESS OF BREATH (Patient taking differently: Inhale 2 puffs into the lungs every 6 (six) hours as needed for wheezing or shortness of breath.)     Allergies:   Patient has no known allergies.   Social History   Tobacco Use  . Smoking status: Former Smoker    Packs/day: 1.00    Years: 40.00    Pack years: 40.00    Types: Cigarettes    Quit date: 02/14/1994    Years since quitting: 26.0  . Smokeless tobacco: Never Used  Substance Use Topics  . Alcohol use: No  . Drug use: No     Family Hx: The patient's family history includes Emphysema in her maternal grandfather and mother.  ROS:   Please see the history of present illness.    All other systems reviewed and are negative.   Prior CV studies:   The following studies were reviewed today: Echo 2013- Study Conclusions   - Left ventricle: The cavity size was normal. Wall thickness was  normal. Systolic function was normal. The estimated ejection  fraction was in the range of 60% to 65%. Wall motion was normal;  there were no regional  wall motion abnormalities. Doppler  parameters are consistent with abnormal left ventricular  relaxation (grade 1 diastolic dysfunction).  - Aortic valve: Valve area (VTI): 2.38 cm^2. Valve area (Vmax):  2.48 cm^2. Valve area (Vmean): 2.48 cm^2.  - Pulmonary arteries: Systolic pressure was mildly increased. PA  peak pressure: 35 mm Hg (S).     Labs/Other Tests and Data Reviewed:    EKG:  An ECG dated 01/20/2020 was personally reviewed today and demonstrated:  AF with VR 94  Recent Labs: 01/23/2020: ALT 11; BUN 16; Creatinine, Ser 1.21; Hemoglobin 8.4; Magnesium 2.1; Platelets 318; Potassium 3.7; Sodium 142   Recent Lipid Panel No results found for: CHOL, TRIG, HDL, CHOLHDL, LDLCALC, LDLDIRECT  Wt Readings  from Last 3 Encounters:  03/02/20 163 lb (73.9 kg)  01/21/20 165 lb 2 oz (74.9 kg)  08/20/19 174 lb 6.4 oz (79.1 kg)     Risk Assessment/Calculations:    :[id number]}  CHA2DS2-VASc Score =   6 This indicates a  % annual risk of stroke. The patient's score is based upon:age, sex, CHF, vascular disease, HTN       Objective:    Vital Signs:  BP (!) 115/52   Pulse 71   Ht 5' (1.524 m)   Wt 163 lb (73.9 kg)   SpO2 97%   BMI 31.83 kg/m    VITAL SIGNS:  reviewed  ASSESSMENT & PLAN:    CAF- Rate controlled, asymptomatic  Chronic anticoagulation- On Eliquis 5 mg BID, SCr 1.21, wgt 74 kg  History of CAP- Admitted in Dec 2021 with COVID negative CAP and UTI- she has done well since discharge.  LSCA stenosis- Asymptomatic  COPD- Stable by her history- on home O2  CRI-3- GFR 44  Chronic anemia- Hgb runs between 8-9  History of diastolic CHF- I am not convinced of this diagnosis. She has had some minor lower extremity edema that resolved with a diuretic. BNP done in the past was normal. Her last echocardiogram was in 2016 and showed grade 1 diastolic dysfunction.  Plan: No change in Rx- f/u Dr Gwenlyn Found in the office in July.   COVID-19 Education: The  signs and symptoms of COVID-19 were discussed with the patient and how to seek care for testing (follow up with PCP or arrange E-visit).  The importance of social distancing was discussed today.  Time:   Today, I have spent 10 minutes with the patient with telehealth technology discussing the above problems.     Medication Adjustments/Labs and Tests Ordered: Current medicines are reviewed at length with the patient today.  Concerns regarding medicines are outlined above.   Tests Ordered: No orders of the defined types were placed in this encounter.   Medication Changes: No orders of the defined types were placed in this encounter.   Follow Up:  In Person in July with Dr Gwenlyn Found  Signed, Kerin Ransom, PA-C  03/02/2020 11:13 AM    [place]

## 2020-03-02 NOTE — Patient Instructions (Signed)
Medication Instructions:  Continue current medications  *If you need a refill on your cardiac medications before your next appointment, please call your pharmacy*   Lab Work: None Ordered   Testing/Procedures: None Ordered   Follow-Up: At [facility], you and your health needs are our priority.  As part of our continuing mission to provide you with exceptional heart care, we have created designated Provider Care Teams.  These Care Teams include your primary Cardiologist (physician) and Advanced Practice Providers (APPs -  Physician Assistants and Nurse Practitioners) who all work together to provide you with the care you need, when you need it.  We recommend signing up for the patient portal called "MyChart".  Sign up information is provided on this After Visit Summary.  MyChart is used to connect with patients for Virtual Visits (Telemedicine).  Patients are able to view lab/test results, encounter notes, upcoming appointments, etc.  Non-urgent messages can be sent to your provider as well.   To learn more about what you can do with MyChart, go to [email or web address].    Your next appointment:   6 Months  The format for your next appointment:   In Person  Provider:   Quay Burow, MD

## 2020-03-03 DIAGNOSIS — E785 Hyperlipidemia, unspecified: Secondary | ICD-10-CM | POA: Diagnosis not present

## 2020-03-03 DIAGNOSIS — I10 Essential (primary) hypertension: Secondary | ICD-10-CM | POA: Diagnosis not present

## 2020-03-03 DIAGNOSIS — I5032 Chronic diastolic (congestive) heart failure: Secondary | ICD-10-CM | POA: Diagnosis not present

## 2020-03-03 DIAGNOSIS — D500 Iron deficiency anemia secondary to blood loss (chronic): Secondary | ICD-10-CM | POA: Diagnosis not present

## 2020-03-03 DIAGNOSIS — D509 Iron deficiency anemia, unspecified: Secondary | ICD-10-CM | POA: Diagnosis not present

## 2020-03-03 DIAGNOSIS — I480 Paroxysmal atrial fibrillation: Secondary | ICD-10-CM | POA: Diagnosis not present

## 2020-03-03 DIAGNOSIS — J449 Chronic obstructive pulmonary disease, unspecified: Secondary | ICD-10-CM | POA: Diagnosis not present

## 2020-03-03 DIAGNOSIS — D0511 Intraductal carcinoma in situ of right breast: Secondary | ICD-10-CM | POA: Diagnosis not present

## 2020-03-03 DIAGNOSIS — K219 Gastro-esophageal reflux disease without esophagitis: Secondary | ICD-10-CM | POA: Diagnosis not present

## 2020-03-04 DIAGNOSIS — R0902 Hypoxemia: Secondary | ICD-10-CM | POA: Diagnosis not present

## 2020-03-04 DIAGNOSIS — J449 Chronic obstructive pulmonary disease, unspecified: Secondary | ICD-10-CM | POA: Diagnosis not present

## 2020-03-04 DIAGNOSIS — J9610 Chronic respiratory failure, unspecified whether with hypoxia or hypercapnia: Secondary | ICD-10-CM | POA: Diagnosis not present

## 2020-03-05 DIAGNOSIS — N1832 Chronic kidney disease, stage 3b: Secondary | ICD-10-CM | POA: Diagnosis not present

## 2020-03-05 DIAGNOSIS — I2510 Atherosclerotic heart disease of native coronary artery without angina pectoris: Secondary | ICD-10-CM | POA: Diagnosis not present

## 2020-03-05 DIAGNOSIS — J432 Centrilobular emphysema: Secondary | ICD-10-CM | POA: Diagnosis not present

## 2020-03-05 DIAGNOSIS — I130 Hypertensive heart and chronic kidney disease with heart failure and stage 1 through stage 4 chronic kidney disease, or unspecified chronic kidney disease: Secondary | ICD-10-CM | POA: Diagnosis not present

## 2020-03-05 DIAGNOSIS — D631 Anemia in chronic kidney disease: Secondary | ICD-10-CM | POA: Diagnosis not present

## 2020-03-05 DIAGNOSIS — J189 Pneumonia, unspecified organism: Secondary | ICD-10-CM | POA: Diagnosis not present

## 2020-03-05 DIAGNOSIS — I5032 Chronic diastolic (congestive) heart failure: Secondary | ICD-10-CM | POA: Diagnosis not present

## 2020-03-05 DIAGNOSIS — N390 Urinary tract infection, site not specified: Secondary | ICD-10-CM | POA: Diagnosis not present

## 2020-03-05 DIAGNOSIS — I0981 Rheumatic heart failure: Secondary | ICD-10-CM | POA: Diagnosis not present

## 2020-03-11 DIAGNOSIS — J189 Pneumonia, unspecified organism: Secondary | ICD-10-CM | POA: Diagnosis not present

## 2020-03-11 DIAGNOSIS — N390 Urinary tract infection, site not specified: Secondary | ICD-10-CM | POA: Diagnosis not present

## 2020-03-11 DIAGNOSIS — I2510 Atherosclerotic heart disease of native coronary artery without angina pectoris: Secondary | ICD-10-CM | POA: Diagnosis not present

## 2020-03-11 DIAGNOSIS — N1832 Chronic kidney disease, stage 3b: Secondary | ICD-10-CM | POA: Diagnosis not present

## 2020-03-11 DIAGNOSIS — I5032 Chronic diastolic (congestive) heart failure: Secondary | ICD-10-CM | POA: Diagnosis not present

## 2020-03-11 DIAGNOSIS — I130 Hypertensive heart and chronic kidney disease with heart failure and stage 1 through stage 4 chronic kidney disease, or unspecified chronic kidney disease: Secondary | ICD-10-CM | POA: Diagnosis not present

## 2020-03-11 DIAGNOSIS — J432 Centrilobular emphysema: Secondary | ICD-10-CM | POA: Diagnosis not present

## 2020-03-11 DIAGNOSIS — D631 Anemia in chronic kidney disease: Secondary | ICD-10-CM | POA: Diagnosis not present

## 2020-03-11 DIAGNOSIS — I0981 Rheumatic heart failure: Secondary | ICD-10-CM | POA: Diagnosis not present

## 2020-03-17 DIAGNOSIS — D631 Anemia in chronic kidney disease: Secondary | ICD-10-CM | POA: Diagnosis not present

## 2020-03-17 DIAGNOSIS — I0981 Rheumatic heart failure: Secondary | ICD-10-CM | POA: Diagnosis not present

## 2020-03-17 DIAGNOSIS — N1832 Chronic kidney disease, stage 3b: Secondary | ICD-10-CM | POA: Diagnosis not present

## 2020-03-17 DIAGNOSIS — J432 Centrilobular emphysema: Secondary | ICD-10-CM | POA: Diagnosis not present

## 2020-03-17 DIAGNOSIS — I130 Hypertensive heart and chronic kidney disease with heart failure and stage 1 through stage 4 chronic kidney disease, or unspecified chronic kidney disease: Secondary | ICD-10-CM | POA: Diagnosis not present

## 2020-03-17 DIAGNOSIS — J189 Pneumonia, unspecified organism: Secondary | ICD-10-CM | POA: Diagnosis not present

## 2020-03-17 DIAGNOSIS — N390 Urinary tract infection, site not specified: Secondary | ICD-10-CM | POA: Diagnosis not present

## 2020-03-17 DIAGNOSIS — I5032 Chronic diastolic (congestive) heart failure: Secondary | ICD-10-CM | POA: Diagnosis not present

## 2020-03-17 DIAGNOSIS — I2510 Atherosclerotic heart disease of native coronary artery without angina pectoris: Secondary | ICD-10-CM | POA: Diagnosis not present

## 2020-04-04 DIAGNOSIS — J9610 Chronic respiratory failure, unspecified whether with hypoxia or hypercapnia: Secondary | ICD-10-CM | POA: Diagnosis not present

## 2020-04-04 DIAGNOSIS — R0902 Hypoxemia: Secondary | ICD-10-CM | POA: Diagnosis not present

## 2020-04-04 DIAGNOSIS — J449 Chronic obstructive pulmonary disease, unspecified: Secondary | ICD-10-CM | POA: Diagnosis not present

## 2020-04-10 DIAGNOSIS — J449 Chronic obstructive pulmonary disease, unspecified: Secondary | ICD-10-CM | POA: Diagnosis not present

## 2020-04-10 DIAGNOSIS — K219 Gastro-esophageal reflux disease without esophagitis: Secondary | ICD-10-CM | POA: Diagnosis not present

## 2020-04-10 DIAGNOSIS — I5032 Chronic diastolic (congestive) heart failure: Secondary | ICD-10-CM | POA: Diagnosis not present

## 2020-04-10 DIAGNOSIS — D509 Iron deficiency anemia, unspecified: Secondary | ICD-10-CM | POA: Diagnosis not present

## 2020-04-10 DIAGNOSIS — I480 Paroxysmal atrial fibrillation: Secondary | ICD-10-CM | POA: Diagnosis not present

## 2020-04-10 DIAGNOSIS — D0511 Intraductal carcinoma in situ of right breast: Secondary | ICD-10-CM | POA: Diagnosis not present

## 2020-04-10 DIAGNOSIS — I10 Essential (primary) hypertension: Secondary | ICD-10-CM | POA: Diagnosis not present

## 2020-04-10 DIAGNOSIS — E785 Hyperlipidemia, unspecified: Secondary | ICD-10-CM | POA: Diagnosis not present

## 2020-04-10 DIAGNOSIS — D500 Iron deficiency anemia secondary to blood loss (chronic): Secondary | ICD-10-CM | POA: Diagnosis not present

## 2020-04-16 ENCOUNTER — Ambulatory Visit
Admission: RE | Admit: 2020-04-16 | Discharge: 2020-04-16 | Disposition: A | Discharge status: Home / Self Care | Payer: Medicare HMO | Source: Ambulatory Visit | Attending: Internal Medicine | Admitting: Internal Medicine

## 2020-04-16 ENCOUNTER — Other Ambulatory Visit: Payer: Self-pay

## 2020-04-16 DIAGNOSIS — Z9889 Other specified postprocedural states: Secondary | ICD-10-CM

## 2020-04-16 DIAGNOSIS — R922 Inconclusive mammogram: Secondary | ICD-10-CM | POA: Diagnosis not present

## 2020-04-28 DIAGNOSIS — E785 Hyperlipidemia, unspecified: Secondary | ICD-10-CM | POA: Diagnosis not present

## 2020-04-28 DIAGNOSIS — I480 Paroxysmal atrial fibrillation: Secondary | ICD-10-CM | POA: Diagnosis not present

## 2020-04-28 DIAGNOSIS — J449 Chronic obstructive pulmonary disease, unspecified: Secondary | ICD-10-CM | POA: Diagnosis not present

## 2020-04-28 DIAGNOSIS — K219 Gastro-esophageal reflux disease without esophagitis: Secondary | ICD-10-CM | POA: Diagnosis not present

## 2020-04-28 DIAGNOSIS — I5032 Chronic diastolic (congestive) heart failure: Secondary | ICD-10-CM | POA: Diagnosis not present

## 2020-04-28 DIAGNOSIS — N183 Chronic kidney disease, stage 3 unspecified: Secondary | ICD-10-CM | POA: Diagnosis not present

## 2020-04-28 DIAGNOSIS — I10 Essential (primary) hypertension: Secondary | ICD-10-CM | POA: Diagnosis not present

## 2020-04-28 DIAGNOSIS — D509 Iron deficiency anemia, unspecified: Secondary | ICD-10-CM | POA: Diagnosis not present

## 2020-04-28 DIAGNOSIS — D500 Iron deficiency anemia secondary to blood loss (chronic): Secondary | ICD-10-CM | POA: Diagnosis not present

## 2020-05-02 DIAGNOSIS — J9610 Chronic respiratory failure, unspecified whether with hypoxia or hypercapnia: Secondary | ICD-10-CM | POA: Diagnosis not present

## 2020-05-02 DIAGNOSIS — J449 Chronic obstructive pulmonary disease, unspecified: Secondary | ICD-10-CM | POA: Diagnosis not present

## 2020-05-02 DIAGNOSIS — R0902 Hypoxemia: Secondary | ICD-10-CM | POA: Diagnosis not present

## 2020-05-26 DIAGNOSIS — J449 Chronic obstructive pulmonary disease, unspecified: Secondary | ICD-10-CM | POA: Diagnosis not present

## 2020-05-26 DIAGNOSIS — D500 Iron deficiency anemia secondary to blood loss (chronic): Secondary | ICD-10-CM | POA: Diagnosis not present

## 2020-05-26 DIAGNOSIS — D0511 Intraductal carcinoma in situ of right breast: Secondary | ICD-10-CM | POA: Diagnosis not present

## 2020-05-26 DIAGNOSIS — I5032 Chronic diastolic (congestive) heart failure: Secondary | ICD-10-CM | POA: Diagnosis not present

## 2020-05-26 DIAGNOSIS — E785 Hyperlipidemia, unspecified: Secondary | ICD-10-CM | POA: Diagnosis not present

## 2020-05-26 DIAGNOSIS — I480 Paroxysmal atrial fibrillation: Secondary | ICD-10-CM | POA: Diagnosis not present

## 2020-05-26 DIAGNOSIS — I10 Essential (primary) hypertension: Secondary | ICD-10-CM | POA: Diagnosis not present

## 2020-05-26 DIAGNOSIS — D509 Iron deficiency anemia, unspecified: Secondary | ICD-10-CM | POA: Diagnosis not present

## 2020-05-26 DIAGNOSIS — K219 Gastro-esophageal reflux disease without esophagitis: Secondary | ICD-10-CM | POA: Diagnosis not present

## 2020-06-02 DIAGNOSIS — R0902 Hypoxemia: Secondary | ICD-10-CM | POA: Diagnosis not present

## 2020-06-02 DIAGNOSIS — J9610 Chronic respiratory failure, unspecified whether with hypoxia or hypercapnia: Secondary | ICD-10-CM | POA: Diagnosis not present

## 2020-06-02 DIAGNOSIS — J449 Chronic obstructive pulmonary disease, unspecified: Secondary | ICD-10-CM | POA: Diagnosis not present

## 2020-07-01 DIAGNOSIS — I10 Essential (primary) hypertension: Secondary | ICD-10-CM | POA: Diagnosis not present

## 2020-07-01 DIAGNOSIS — I5032 Chronic diastolic (congestive) heart failure: Secondary | ICD-10-CM | POA: Diagnosis not present

## 2020-07-01 DIAGNOSIS — N183 Chronic kidney disease, stage 3 unspecified: Secondary | ICD-10-CM | POA: Diagnosis not present

## 2020-07-01 DIAGNOSIS — I480 Paroxysmal atrial fibrillation: Secondary | ICD-10-CM | POA: Diagnosis not present

## 2020-07-01 DIAGNOSIS — K219 Gastro-esophageal reflux disease without esophagitis: Secondary | ICD-10-CM | POA: Diagnosis not present

## 2020-07-01 DIAGNOSIS — D509 Iron deficiency anemia, unspecified: Secondary | ICD-10-CM | POA: Diagnosis not present

## 2020-07-01 DIAGNOSIS — J449 Chronic obstructive pulmonary disease, unspecified: Secondary | ICD-10-CM | POA: Diagnosis not present

## 2020-07-01 DIAGNOSIS — E785 Hyperlipidemia, unspecified: Secondary | ICD-10-CM | POA: Diagnosis not present

## 2020-07-02 DIAGNOSIS — R0902 Hypoxemia: Secondary | ICD-10-CM | POA: Diagnosis not present

## 2020-07-02 DIAGNOSIS — J9610 Chronic respiratory failure, unspecified whether with hypoxia or hypercapnia: Secondary | ICD-10-CM | POA: Diagnosis not present

## 2020-07-02 DIAGNOSIS — J449 Chronic obstructive pulmonary disease, unspecified: Secondary | ICD-10-CM | POA: Diagnosis not present

## 2020-08-02 DIAGNOSIS — J449 Chronic obstructive pulmonary disease, unspecified: Secondary | ICD-10-CM | POA: Diagnosis not present

## 2020-08-02 DIAGNOSIS — J9610 Chronic respiratory failure, unspecified whether with hypoxia or hypercapnia: Secondary | ICD-10-CM | POA: Diagnosis not present

## 2020-08-02 DIAGNOSIS — R0902 Hypoxemia: Secondary | ICD-10-CM | POA: Diagnosis not present

## 2020-08-10 ENCOUNTER — Other Ambulatory Visit: Payer: Self-pay | Admitting: Critical Care Medicine

## 2020-08-12 ENCOUNTER — Other Ambulatory Visit: Payer: Self-pay | Admitting: Internal Medicine

## 2020-08-12 ENCOUNTER — Telehealth: Payer: Self-pay | Admitting: Critical Care Medicine

## 2020-08-12 ENCOUNTER — Other Ambulatory Visit: Payer: Self-pay

## 2020-08-12 ENCOUNTER — Ambulatory Visit
Admission: RE | Admit: 2020-08-12 | Discharge: 2020-08-12 | Disposition: A | Discharge status: Home / Self Care | Payer: Medicare HMO | Source: Ambulatory Visit | Attending: Internal Medicine | Admitting: Internal Medicine

## 2020-08-12 ENCOUNTER — Other Ambulatory Visit: Payer: Self-pay | Admitting: Critical Care Medicine

## 2020-08-12 DIAGNOSIS — J449 Chronic obstructive pulmonary disease, unspecified: Secondary | ICD-10-CM | POA: Diagnosis not present

## 2020-08-12 DIAGNOSIS — I5032 Chronic diastolic (congestive) heart failure: Secondary | ICD-10-CM | POA: Diagnosis not present

## 2020-08-12 DIAGNOSIS — D638 Anemia in other chronic diseases classified elsewhere: Secondary | ICD-10-CM | POA: Diagnosis not present

## 2020-08-12 DIAGNOSIS — J90 Pleural effusion, not elsewhere classified: Secondary | ICD-10-CM | POA: Diagnosis not present

## 2020-08-12 DIAGNOSIS — I480 Paroxysmal atrial fibrillation: Secondary | ICD-10-CM | POA: Diagnosis not present

## 2020-08-12 DIAGNOSIS — E785 Hyperlipidemia, unspecified: Secondary | ICD-10-CM | POA: Diagnosis not present

## 2020-08-12 NOTE — Telephone Encounter (Signed)
Called and spoke with pt who states that when she is ambulating, her O2 sats have been dropping down to the 80s.  Pt states that she is on O2 and wears 3L 24/7.  Stated to pt that we did not have any openings today for an appt and stated to her if she felt like she needed to be seen today that she should go to either UC or ED for evaluation and she verbalized understanding. I did schedule pt for a f/u since it has been over a year since her last appt with [place] so we can reevaluate her O2 sats at that appt. Nothing further needed.

## 2020-08-13 DIAGNOSIS — I5032 Chronic diastolic (congestive) heart failure: Secondary | ICD-10-CM | POA: Diagnosis not present

## 2020-08-13 DIAGNOSIS — D500 Iron deficiency anemia secondary to blood loss (chronic): Secondary | ICD-10-CM | POA: Diagnosis not present

## 2020-08-13 DIAGNOSIS — I10 Essential (primary) hypertension: Secondary | ICD-10-CM | POA: Diagnosis not present

## 2020-08-13 DIAGNOSIS — J449 Chronic obstructive pulmonary disease, unspecified: Secondary | ICD-10-CM | POA: Diagnosis not present

## 2020-08-13 DIAGNOSIS — K219 Gastro-esophageal reflux disease without esophagitis: Secondary | ICD-10-CM | POA: Diagnosis not present

## 2020-08-13 DIAGNOSIS — I480 Paroxysmal atrial fibrillation: Secondary | ICD-10-CM | POA: Diagnosis not present

## 2020-08-13 DIAGNOSIS — E785 Hyperlipidemia, unspecified: Secondary | ICD-10-CM | POA: Diagnosis not present

## 2020-08-13 DIAGNOSIS — D0511 Intraductal carcinoma in situ of right breast: Secondary | ICD-10-CM | POA: Diagnosis not present

## 2020-08-13 DIAGNOSIS — D509 Iron deficiency anemia, unspecified: Secondary | ICD-10-CM | POA: Diagnosis not present

## 2020-08-26 DIAGNOSIS — E785 Hyperlipidemia, unspecified: Secondary | ICD-10-CM | POA: Diagnosis not present

## 2020-08-26 DIAGNOSIS — Z23 Encounter for immunization: Secondary | ICD-10-CM | POA: Diagnosis not present

## 2020-08-26 DIAGNOSIS — I10 Essential (primary) hypertension: Secondary | ICD-10-CM | POA: Diagnosis not present

## 2020-08-26 DIAGNOSIS — R202 Paresthesia of skin: Secondary | ICD-10-CM | POA: Diagnosis not present

## 2020-08-26 DIAGNOSIS — G629 Polyneuropathy, unspecified: Secondary | ICD-10-CM | POA: Diagnosis not present

## 2020-08-26 DIAGNOSIS — J449 Chronic obstructive pulmonary disease, unspecified: Secondary | ICD-10-CM | POA: Diagnosis not present

## 2020-08-26 DIAGNOSIS — N1832 Chronic kidney disease, stage 3b: Secondary | ICD-10-CM | POA: Diagnosis not present

## 2020-08-26 DIAGNOSIS — D509 Iron deficiency anemia, unspecified: Secondary | ICD-10-CM | POA: Diagnosis not present

## 2020-08-26 DIAGNOSIS — I480 Paroxysmal atrial fibrillation: Secondary | ICD-10-CM | POA: Diagnosis not present

## 2020-08-31 ENCOUNTER — Other Ambulatory Visit: Payer: Self-pay

## 2020-08-31 ENCOUNTER — Encounter: Payer: Self-pay | Admitting: Primary Care

## 2020-08-31 ENCOUNTER — Ambulatory Visit (INDEPENDENT_AMBULATORY_CARE_PROVIDER_SITE_OTHER): Payer: Medicare HMO

## 2020-08-31 ENCOUNTER — Ambulatory Visit: Payer: Medicare HMO | Admitting: Primary Care

## 2020-08-31 VITALS — BP 130/72 | HR 69 | Ht 60.0 in | Wt 170.0 lb

## 2020-08-31 DIAGNOSIS — R0602 Shortness of breath: Secondary | ICD-10-CM | POA: Diagnosis not present

## 2020-08-31 DIAGNOSIS — J449 Chronic obstructive pulmonary disease, unspecified: Secondary | ICD-10-CM

## 2020-08-31 DIAGNOSIS — J9611 Chronic respiratory failure with hypoxia: Secondary | ICD-10-CM | POA: Diagnosis not present

## 2020-08-31 DIAGNOSIS — J9811 Atelectasis: Secondary | ICD-10-CM | POA: Diagnosis not present

## 2020-08-31 DIAGNOSIS — J439 Emphysema, unspecified: Secondary | ICD-10-CM | POA: Diagnosis not present

## 2020-08-31 DIAGNOSIS — J90 Pleural effusion, not elsewhere classified: Secondary | ICD-10-CM | POA: Diagnosis not present

## 2020-08-31 DIAGNOSIS — I4819 Other persistent atrial fibrillation: Secondary | ICD-10-CM | POA: Diagnosis not present

## 2020-08-31 MED ORDER — TRELEGY ELLIPTA 100-62.5-25 MCG/INH IN AEPB: 1.0000 | INHALATION_SPRAY | Freq: Every day | RESPIRATORY_TRACT | 0 refills | Status: DC

## 2020-08-31 NOTE — Patient Instructions (Signed)
Recommendations: No changes today Ambulatory walk test showed that her O2 level stayed above 96% on 3L We will check CXR to ensure there is no evidence for pneumonia  Use incentive spirometry 5-10 deep breaths every hour while awake  Follow-up: - FU in 1-2 months/ Needs to establish with new [facility] pulmonary provider- COPD (recommend Dr. Silas Flood, [place] or [place])

## 2020-08-31 NOTE — Assessment & Plan Note (Signed)
-   Regular rate on exam  - Continue Eliquis 5mg  BID and Metoprolol 25mg  BID

## 2020-08-31 NOTE — Assessment & Plan Note (Addendum)
-   Patient reports oxygen desaturation 90% on oxygen. There were no oxygen desaturations on ambulatory walk test today in office. Her O2 stay above 96% on 3L with 1 lap. We will check CXR to ensure there is no evidence for pneumonia or pleural effusion. Advised she use incentive spirometry 5-10 deep breaths every hour while awake.

## 2020-08-31 NOTE — Assessment & Plan Note (Signed)
-   No signs of acute exacerbation. Denies cough, chest tightness of wheezing.  - Continue Trelegy Ellipta 127mcg one puff daily - Needs follow-up in 1-2 months to establish with new [facility] pulmonary provider.

## 2020-08-31 NOTE — Addendum Note (Signed)
Addended by: Dessie Coma on: 08/31/2020 05:28 PM   Modules accepted: Orders

## 2020-08-31 NOTE — Progress Notes (Signed)
Please let patient know CXR showed trace bilateral pleural effusion, ensure she is taking her diuretics as prescribed. No evidence of pneumonia. Continue with IS 10/hr. Return if O2 consistently drops <88% on 3L and does not improve with rest

## 2020-08-31 NOTE — Progress Notes (Signed)
@Patient  ID: Courtney Cline, female    DOB: August 17, 1933, 87 y.o.   MRN: [id number]  Chief Complaint  Patient presents with   Follow-up    Patient reports her breathing is worse on exertion. Worse in the last few months,.     Referring provider: Leeroy Cha,*  HPI: 87 year old female, former smoker quit in [place] (40 pack year hx). PMH significant for COPD, chronic hypoxemic respiratory failure, diastolic CHF, HTN, afib, subclavian artery stenosis, hyperlipidemia, anemia, hx breast cancer. Former [name] patient, last seen on 07/17/19.   Previous [facility] pulmonary encounter: 07/17/19- [name]  [name] is an 87 year old woman with a history of COPD, chronic hypoxic respiratory failure, remote tobacco abuse who presents for follow-up.  Her last COPD exacerbation was in 2019 due to allergies.  At that time she was prescribed Flonase and Zyrtec.  She has been having more sinus congestion, postnasal drip, sneezing due to allergies recently, but stopped taking Zyrtec due to perceived ineffectiveness.  She did not notice a benefit with Claritin either.  She has not been using Flonase. She has year-round rhinitis even when her allergies are at baseline.  She has been compliant with using 3 L supplemental oxygen continuously, including with sleeping.  She continues to use Trelegy daily.  She uses albuterol 2-4 times daily for shortness of breath with improvement in her symptoms.   She is able to walk around her house and around stores stopping frequently, but had to have a wheelchair to get from the lobby to the exam room today.  She has no cough, wheezing, sputum production. No CP, palpitations. She has had leg edema since missing a dose of lasix recently. She has occasional epistaxis, but no other bleeding. She is UTD on her Covid vaccine, and her entire family has been vaccinated.      08/31/2020 - Interim hx  During last visit in June 2021 she was noted to be in new onset Afib. She was  started on Eliquis 5mg  twice daily and metoprolol 25mg  twice daily. She was encouraged to follow-up with her PCP. Patient presents today for overdue follow-up. Accompanied by her son today. Patient reports decreased O2 readings on oxygen. She is maintained on Trelegy once daily and prn Albuterol for COPD. She wears 3L supplemental oxygen continuously. The lowest she has seen her oxygen has been 90%. She feels well today. No acute complaints except for fatigue and some mild sinus congestion. She is compliant with Metoprolol and Eliquis for afib. CXR on 08/12/20 showed bibasiliar atelectasis or scarring. Denies cough, chest tightness, wheezing.    No Known Allergies  Immunization History  Administered Date(s) Administered   Fluad Quad(high Dose 65+) 10/15/2018   Influenza Split 11/23/2012, 11/14/2014   Influenza, High Dose Seasonal PF 11/16/2015, 11/10/2016   Influenza,inj,Quad PF,6+ Mos 11/11/2013   Influenza-Unspecified 11/17/2017   Moderna Sars-Covid-2 Vaccination 04/13/2019, 05/11/2019, 12/16/2019   Pneumococcal Conjugate-13 12/11/2013   Pneumococcal Polysaccharide-23 11/15/2002   Tdap 11/10/2011    Past Medical History:  Diagnosis Date   Allergic rhinitis    Arthritis    Breast calcification seen on mammogram    Right breast   Breast cancer (HCC)    COPD (chronic obstructive pulmonary disease) (HCC)    GERD (gastroesophageal reflux disease)    Hyperglycemia    Hyperlipidemia    Hypertension    Low back pain    On home oxygen therapy    all the time   Osteopenia    Peripheral  edema    Shortness of breath    Subclavian artery stenosis, left (HCC)    Vitamin D deficiency    Wears dentures    top   Wears glasses    reading    Tobacco History: Social History   Tobacco Use  Smoking Status Former   Packs/day: 1.00   Years: 40.00   Pack years: 40.00   Types: Cigarettes   Quit date: 02/14/1994   Years since quitting: 26.5  Smokeless Tobacco Never   Counseling given:  Not Answered   Outpatient Medications Prior to Visit  Medication Sig Dispense Refill   acetaminophen (TYLENOL) 325 MG tablet Take 650 mg by mouth every 6 (six) hours as needed for mild pain or headache.      apixaban (ELIQUIS) 5 MG TABS tablet Take 1 tablet (5 mg total) by mouth 2 (two) times daily. 60 tablet 0   Cholecalciferol (VITAMIN D) 2000 UNITS CAPS Take 2,000 Units by mouth daily.      Cyanocobalamin (VITAMIN B 12 PO) Take 1 tablet by mouth daily.     DiphenhydrAMINE HCl (BENADRYL PO) Take 1 tablet by mouth daily as needed (allergy).      famotidine (PEPCID) 20 MG tablet Take 20 mg by mouth every other day.      Fluticasone-Umeclidin-Vilant (TRELEGY ELLIPTA) 100-62.5-25 MCG/INH AEPB INHALE 1 PUFF BY MOUTH EVERY DAY 60 each 0   furosemide (LASIX) 40 MG tablet Take 1 tablet (40 mg total) by mouth 2 (two) times daily. <PLEASE MAKE APPOINTMENT FOR REFILLS> (Patient taking differently: Take 40 mg by mouth daily.) 180 tablet 2   lisinopril (PRINIVIL,ZESTRIL) 40 MG tablet Take 40 mg by mouth daily.     metoprolol tartrate (LOPRESSOR) 25 MG tablet Take 1 tablet (25 mg total) by mouth 2 (two) times daily. 60 tablet 0   PROAIR HFA 108 (90 Base) MCG/ACT inhaler TAKE 2 PUFFS BY MOUTH EVERY 6 HOURS AS NEEDED FOR WHEEZE OR SHORTNESS OF BREATH (Patient taking differently: Inhale 2 puffs into the lungs every 6 (six) hours as needed for wheezing or shortness of breath.) 8.5 Inhaler 11   azelastine (ASTELIN) 0.1 % nasal spray Place 2 sprays into both nostrils 2 (two) times daily. Use in each nostril as directed (Patient not taking: Reported on 08/31/2020) 30 mL 12   KLOR-CON M20 20 MEQ tablet TAKE 1 TABLET BY MOUTH DAILY (Patient not taking: Reported on 08/31/2020) 90 tablet 2   lovastatin (MEVACOR) 40 MG tablet Take 40 mg by mouth at bedtime. (Patient not taking: Reported on 08/31/2020)     omeprazole (PRILOSEC) 20 MG capsule Take 20 mg by mouth every other day.  (Patient not taking: Reported on 08/31/2020)      No facility-administered medications prior to visit.    Review of Systems  Review of Systems  Constitutional: Negative.   HENT:  Positive for congestion.   Respiratory:  Negative for cough, chest tightness, shortness of breath and wheezing.   Cardiovascular: Negative.     Physical Exam  BP 130/72 (BP Location: Left Arm, Patient Position: Sitting, Cuff Size: Normal)   Pulse 69   Ht 5' (1.524 m)   Wt 170 lb (77.1 kg)   SpO2 100%   BMI 33.20 kg/m  Physical Exam Constitutional:      Appearance: Normal appearance.  HENT:     Mouth/Throat:     Comments: Deferred d.t masking  Cardiovascular:     Rate and Rhythm: Normal rate and regular rhythm.  Pulmonary:  Effort: Pulmonary effort is normal.     Breath sounds: Normal breath sounds.  Skin:    General: Skin is warm and dry.  Neurological:     General: No focal deficit present.     Mental Status: She is alert and oriented to person, place, and time. Mental status is at baseline.  Psychiatric:        Mood and Affect: Mood normal.        Behavior: Behavior normal.        Thought Content: Thought content normal.        Judgment: Judgment normal.     Lab Results:  CBC    Component Value Date/Time   WBC 6.5 01/23/2020 0225   RBC 2.88 (L) 01/23/2020 0225   HGB 8.4 (L) 01/23/2020 0225   HGB 9.3 (L) 12/24/2018 1116   HGB 9.6 (L) 11/07/2016 0932   HCT 28.1 (L) 01/23/2020 0225   HCT 31.0 (L) 11/07/2016 0932   PLT 318 01/23/2020 0225   PLT 223 12/24/2018 1116   PLT 205 11/07/2016 0932   MCV 97.6 01/23/2020 0225   MCV 96.6 11/07/2016 0932   MCH 29.2 01/23/2020 0225   MCHC 29.9 (L) 01/23/2020 0225   RDW 13.1 01/23/2020 0225   RDW 13.4 11/07/2016 0932   LYMPHSABS 1.2 12/24/2018 1116   LYMPHSABS 1.3 11/07/2016 0932   MONOABS 0.5 12/24/2018 1116   MONOABS 0.4 11/07/2016 0932   EOSABS 0.1 12/24/2018 1116   EOSABS 0.2 11/07/2016 0932   BASOSABS 0.0 12/24/2018 1116   BASOSABS 0.0 11/07/2016 0932    BMET     Component Value Date/Time   NA 142 01/23/2020 0225   NA 142 11/07/2016 0932   K 3.7 01/23/2020 0225   K 4.3 11/07/2016 0932   CL 106 01/23/2020 0225   CO2 26 01/23/2020 0225   CO2 30 (H) 11/07/2016 0932   GLUCOSE 92 01/23/2020 0225   GLUCOSE 118 11/07/2016 0932   BUN 16 01/23/2020 0225   BUN 28.2 (H) 11/07/2016 0932   CREATININE 1.21 (H) 01/23/2020 0225   CREATININE 1.39 (H) 12/24/2018 1116   CREATININE 1.3 (H) 11/07/2016 0932   CALCIUM 8.8 (L) 01/23/2020 0225   CALCIUM 9.7 11/07/2016 0932   GFRNONAA 44 (L) 01/23/2020 0225   GFRNONAA 35 (L) 12/24/2018 1116   GFRAA 41 (L) 12/24/2018 1116    BNP No results found for: BNP  ProBNP    Component Value Date/Time   PROBNP 29.0 07/17/2017 1501    Imaging: DG Chest 2 View  Result Date: 08/12/2020 CLINICAL DATA:  87 year old female with COPD. EXAM: CHEST - 2 VIEW COMPARISON:  Chest radiograph dated 02/12/2018. FINDINGS: Linear and streaky bibasilar atelectasis or scarring. Pneumonia is not excluded clinical correlation is recommended. Trace pleural effusions may be present. No focal consolidation or pneumothorax. The cardiac silhouette is within limits. Atherosclerotic calcification of the aorta. No acute osseous pathology. Degenerative changes of the spine. IMPRESSION: Bibasilar atelectasis or scarring. Pneumonia is not excluded. Electronically Signed   By: Anner Crete M.D.   On: 08/12/2020 16:37     Assessment & Plan:   Chronic hypoxemic respiratory failure ([place]) - Patient reports oxygen desaturation 90% on oxygen. There were no oxygen desaturations on ambulatory walk test today in office. Her O2 stay above 96% on 3L with 1 lap. We will check CXR to ensure there is no evidence for pneumonia or pleural effusion. Advised she use incentive spirometry 5-10 deep breaths every hour while awake.  COPD without exacerbation (  [place]) - No signs of acute exacerbation. Denies cough, chest tightness of wheezing.  - Continue Trelegy Ellipta  116mcg one puff daily - Needs follow-up in 1-2 months to establish with new [facility] pulmonary provider.   Persistent atrial fibrillation (HCC) - Regular rate on exam  - Continue Eliquis 5mg  BID and Metoprolol 25mg  BID   40 mins spent on case, >50% time face to face  Martyn Ehrich, NP 08/31/2020

## 2020-09-01 ENCOUNTER — Encounter: Payer: Self-pay | Admitting: Cardiovascular Disease

## 2020-09-01 ENCOUNTER — Ambulatory Visit: Payer: Medicare HMO | Admitting: Cardiovascular Disease

## 2020-09-01 VITALS — BP 114/50 | HR 72 | Ht 60.0 in | Wt 168.0 lb

## 2020-09-01 DIAGNOSIS — R0902 Hypoxemia: Secondary | ICD-10-CM | POA: Diagnosis not present

## 2020-09-01 DIAGNOSIS — I10 Essential (primary) hypertension: Secondary | ICD-10-CM | POA: Diagnosis not present

## 2020-09-01 DIAGNOSIS — I5032 Chronic diastolic (congestive) heart failure: Secondary | ICD-10-CM | POA: Diagnosis not present

## 2020-09-01 DIAGNOSIS — E782 Mixed hyperlipidemia: Secondary | ICD-10-CM | POA: Diagnosis not present

## 2020-09-01 DIAGNOSIS — J9610 Chronic respiratory failure, unspecified whether with hypoxia or hypercapnia: Secondary | ICD-10-CM | POA: Diagnosis not present

## 2020-09-01 DIAGNOSIS — I4819 Other persistent atrial fibrillation: Secondary | ICD-10-CM | POA: Diagnosis not present

## 2020-09-01 DIAGNOSIS — J449 Chronic obstructive pulmonary disease, unspecified: Secondary | ICD-10-CM | POA: Diagnosis not present

## 2020-09-01 DIAGNOSIS — R600 Localized edema: Secondary | ICD-10-CM

## 2020-09-01 DIAGNOSIS — I771 Stricture of artery: Secondary | ICD-10-CM | POA: Diagnosis not present

## 2020-09-01 NOTE — Progress Notes (Signed)
09/01/2020 Courtney Cline [facility]   Oct 12, 1933  [id number]  Primary Physician Courtney Cha, MD Primary Cardiologist: Courtney Harp MD Courtney Cline, [place]  HPI:  Courtney Cline is a 87 y.o.  married Caucasian female mother of 2, grandmother 4 grandchildren who is accompanied by her son Courtney Cline today.  She was initially referred to me by Dr. Antony Cline for peripheral vascular evaluation because of presumed left subclavian artery stenosis and/or steel. I last saw her in the office 08/20/2019. Her cardiac risk factor profile is notable for remote tobacco abuse having quit 22 years ago, treated hypertension, and hyperlipidemia. She has never had a heart attack or stroke. She was a symptomatically with regards to left upper extremity claudication or dizziness and therefore continue conservative treatment of her subclavian artery stenosis was recommended at that time. She has noticed increasing bilateral lower extremity edema over the last one month which has improved somewhat with the addition of an oral diuretic. She does admit to dietary indiscretion. She is on chronic home O2 for her COPD and has not noticed any increasing shortness of breath.   Since I saw her a year ago she remained stable.  She does have trace lower extreme edema on oral diuretics.  She is on chronic O2 for COPD.  She remains in A. fib rate controlled on Eliquis oral anticoagulation.  She denies chest pain.     Current Meds  Medication Sig   acetaminophen (TYLENOL) 325 MG tablet Take 650 mg by mouth every 6 (six) hours as needed for mild pain or headache.    apixaban (ELIQUIS) 5 MG TABS tablet Take 1 tablet (5 mg total) by mouth 2 (two) times daily.   Cholecalciferol (VITAMIN D) 2000 UNITS CAPS Take 2,000 Units by mouth daily.    Cyanocobalamin (VITAMIN B 12 PO) Take 1 tablet by mouth daily.   DiphenhydrAMINE HCl (BENADRYL PO) Take 1 tablet by mouth daily as needed (allergy).    famotidine (PEPCID) 20  MG tablet Take 20 mg by mouth every other day.    Fluticasone-Umeclidin-Vilant (TRELEGY ELLIPTA) 100-62.5-25 MCG/INH AEPB INHALE 1 PUFF BY MOUTH EVERY DAY   Fluticasone-Umeclidin-Vilant (TRELEGY ELLIPTA) 100-62.5-25 MCG/INH AEPB Inhale 1 puff into the lungs daily.   furosemide (LASIX) 40 MG tablet Take 1 tablet (40 mg total) by mouth 2 (two) times daily. <PLEASE MAKE APPOINTMENT FOR REFILLS> (Patient taking differently: Take 40 mg by mouth daily.)   lisinopril (PRINIVIL,ZESTRIL) 40 MG tablet Take 40 mg by mouth daily.   metoprolol tartrate (LOPRESSOR) 25 MG tablet Take 1 tablet (25 mg total) by mouth 2 (two) times daily.   PROAIR HFA 108 (90 Base) MCG/ACT inhaler TAKE 2 PUFFS BY MOUTH EVERY 6 HOURS AS NEEDED FOR WHEEZE OR SHORTNESS OF BREATH (Patient taking differently: Inhale 2 puffs into the lungs every 6 (six) hours as needed for wheezing or shortness of breath.)     No Known Allergies  Social History   Socioeconomic History   Marital status: Married    Spouse name: Not on file   Number of children: Not on file   Years of education: Not on file   Highest education level: Not on file  Occupational History   Occupation: retired    Comment: Chartered certified accountant  Tobacco Use   Smoking status: Former    Packs/day: 1.00    Years: 40.00    Pack years: 40.00    Types: Cigarettes    Quit date: 02/14/1994    Years  since quitting: 26.5   Smokeless tobacco: Never  Substance and Sexual Activity   Alcohol use: No   Drug use: No   Sexual activity: Not on file  Other Topics Concern   Not on file  Social History Narrative   Not on file   Social Determinants of Health   Financial Resource Strain: Not on file  Food Insecurity: Not on file  Transportation Needs: Not on file  Physical Activity: Not on file  Stress: Not on file  Social Connections: Not on file  Intimate Partner Violence: Not on file     Review of Systems: General: negative for chills, fever, night sweats or weight changes.   Cardiovascular: negative for chest pain, dyspnea on exertion, edema, orthopnea, palpitations, paroxysmal nocturnal dyspnea or shortness of breath Dermatological: negative for rash Respiratory: negative for cough or wheezing Urologic: negative for hematuria Abdominal: negative for nausea, vomiting, diarrhea, bright red blood per rectum, melena, or hematemesis Neurologic: negative for visual changes, syncope, or dizziness All other systems reviewed and are otherwise negative except as noted above.    Blood pressure (!) 114/50, pulse 72, height 5' (1.524 m), weight 168 lb (76.2 kg).  General appearance: alert and no distress Neck: no adenopathy, no carotid bruit, no JVD, supple, symmetrical, trachea midline, and thyroid not enlarged, symmetric, no tenderness/mass/nodules Lungs: clear to auscultation bilaterally Heart: regular rate and rhythm, S1, S2 normal, no murmur, click, rub or gallop Extremities: extremities normal, atraumatic, no cyanosis or edema Pulses: 2+ and symmetric Skin: Skin color, texture, turgor normal. No rashes or lesions Neurologic: Grossly normal  EKG sinus rhythm at 72 without ST or T wave changes.  I personally reviewed this EKG.  ASSESSMENT AND PLAN:   Subclavian artery stenosis, left (HCC) History of left subclavian artery stenosis stable at this time  Bilateral lower extremity edema Minimal bilateral lower extreme edema on exam today on oral diuretics.  Essential hypertension History of essential hypertension a blood pressure measured today at 114/50.  She is on lisinopril and metoprolol.  Hyperlipidemia History of hyperlipidemia not on statin therapy currently but was on lovastatin in the past.  Her most recent lipid profile performed 02/26/2020 revealed total cholesterol 270, LDL of 172 and HDL of 53.  This is significantly increased compared to her last lipid profile 02/03/2019 at which time her LDL was 113.  Diastolic CHF ([place]) History of diastolic  heart failure with echo performed 07/18/2014 revealing normal LV systolic function with grade 1 diastolic dysfunction.  She is on oral diuretic.  Persistent atrial fibrillation (HCC) History of persistent A. fib rate controlled on Eliquis oral anticoagulation.     Courtney Harp MD FACP,FACC,FAHA, [facility] 09/01/2020 3:24 PM

## 2020-09-01 NOTE — Assessment & Plan Note (Signed)
History of left subclavian artery stenosis stable at this time

## 2020-09-01 NOTE — Patient Instructions (Signed)
Medication Instructions:  The current medical regimen is effective;  continue present plan and medications.  *If you need a refill on your cardiac medications before your next appointment, please call your pharmacy*  Follow-Up: At [facility], you and your health needs are our priority.  As part of our continuing mission to provide you with exceptional heart care, we have created designated Provider Care Teams.  These Care Teams include your primary Cardiologist (physician) and Advanced Practice Providers (APPs -  Physician Assistants and Nurse Practitioners) who all work together to provide you with the care you need, when you need it.  We recommend signing up for the patient portal called "MyChart".  Sign up information is provided on this After Visit Summary.  MyChart is used to connect with patients for Virtual Visits (Telemedicine).  Patients are able to view lab/test results, encounter notes, upcoming appointments, etc.  Non-urgent messages can be sent to your provider as well.   To learn more about what you can do with MyChart, go to [email or web address].    Your next appointment:   6 month(s)  The format for your next appointment:   In Person  Provider:   You will see one of the following Advanced Practice Providers on your designated Care Team:   Callie Goodrich, PA-C Jesse Cleaver, FNP  Then, Jonathan Berry, MD will plan to see you again in 12 month(s).      

## 2020-09-01 NOTE — Assessment & Plan Note (Signed)
Minimal bilateral lower extreme edema on exam today on oral diuretics.

## 2020-09-01 NOTE — Assessment & Plan Note (Signed)
History of hyperlipidemia not on statin therapy currently but was on lovastatin in the past.  Her most recent lipid profile performed 02/26/2020 revealed total cholesterol 270, LDL of 172 and HDL of 53.  This is significantly increased compared to her last lipid profile 02/03/2019 at which time her LDL was 113.

## 2020-09-01 NOTE — Assessment & Plan Note (Signed)
History of persistent A-fib rate controlled on Eliquis oral anticoagulation. 

## 2020-09-01 NOTE — Assessment & Plan Note (Signed)
History of essential hypertension a blood pressure measured today at 114/50.  She is on lisinopril and metoprolol.

## 2020-09-01 NOTE — Assessment & Plan Note (Signed)
History of diastolic heart failure with echo performed 07/18/2014 revealing normal LV systolic function with grade 1 diastolic dysfunction.  She is on oral diuretic.

## 2020-09-02 ENCOUNTER — Telehealth: Payer: Self-pay | Admitting: Primary Care

## 2020-09-02 NOTE — Telephone Encounter (Signed)
I called and spoke with patient regarding CXR results. Patient verbalized understanding and stated this taking fluid pill as prescribed. Patient will keep using IS and inform us if breathing becomes worse of if O2 stats drop and do not come up with rest. Nothing further needed.

## 2020-09-04 DIAGNOSIS — I10 Essential (primary) hypertension: Secondary | ICD-10-CM | POA: Diagnosis not present

## 2020-09-04 DIAGNOSIS — D638 Anemia in other chronic diseases classified elsewhere: Secondary | ICD-10-CM | POA: Diagnosis not present

## 2020-09-04 DIAGNOSIS — D509 Iron deficiency anemia, unspecified: Secondary | ICD-10-CM | POA: Diagnosis not present

## 2020-09-04 DIAGNOSIS — D500 Iron deficiency anemia secondary to blood loss (chronic): Secondary | ICD-10-CM | POA: Diagnosis not present

## 2020-09-04 DIAGNOSIS — I5032 Chronic diastolic (congestive) heart failure: Secondary | ICD-10-CM | POA: Diagnosis not present

## 2020-09-04 DIAGNOSIS — E785 Hyperlipidemia, unspecified: Secondary | ICD-10-CM | POA: Diagnosis not present

## 2020-09-04 DIAGNOSIS — D0511 Intraductal carcinoma in situ of right breast: Secondary | ICD-10-CM | POA: Diagnosis not present

## 2020-09-04 DIAGNOSIS — J449 Chronic obstructive pulmonary disease, unspecified: Secondary | ICD-10-CM | POA: Diagnosis not present

## 2020-09-04 DIAGNOSIS — I480 Paroxysmal atrial fibrillation: Secondary | ICD-10-CM | POA: Diagnosis not present

## 2020-09-21 ENCOUNTER — Telehealth: Payer: Self-pay | Admitting: Cardiovascular Disease

## 2020-09-21 NOTE — Telephone Encounter (Signed)
Patient calling the office for samples of medication:   1.  What medication and dosage are you requesting samples for? apixaban (ELIQUIS) 5 MG TABS tablet  2.  Are you currently out of this medication? Yes    

## 2020-09-21 NOTE — Telephone Encounter (Signed)
Returned call to patient. Made patient aware that 2 boxes of Eliquis 5mg  LOT: [id number] EXP: 6/24 will be left at the front desk for pick up.  Advised patient to call back to office with any issues, questions, or concerns. Patient verbalized understanding.

## 2020-09-22 ENCOUNTER — Telehealth: Payer: Self-pay | Admitting: Cardiovascular Disease

## 2020-09-22 NOTE — Telephone Encounter (Signed)
Patient calling the office for samples of medication:   1.  What medication and dosage are you requesting samples for? Eliquis   2.  Are you currently out of this medication? Patient will pick up medication on thursday

## 2020-09-23 NOTE — Telephone Encounter (Signed)
Pt has been approved for pt assistance and they are on the way she just need some until they get to her

## 2020-09-30 DIAGNOSIS — I480 Paroxysmal atrial fibrillation: Secondary | ICD-10-CM | POA: Diagnosis not present

## 2020-09-30 DIAGNOSIS — D500 Iron deficiency anemia secondary to blood loss (chronic): Secondary | ICD-10-CM | POA: Diagnosis not present

## 2020-09-30 DIAGNOSIS — D509 Iron deficiency anemia, unspecified: Secondary | ICD-10-CM | POA: Diagnosis not present

## 2020-09-30 DIAGNOSIS — J449 Chronic obstructive pulmonary disease, unspecified: Secondary | ICD-10-CM | POA: Diagnosis not present

## 2020-09-30 DIAGNOSIS — I5032 Chronic diastolic (congestive) heart failure: Secondary | ICD-10-CM | POA: Diagnosis not present

## 2020-09-30 DIAGNOSIS — D638 Anemia in other chronic diseases classified elsewhere: Secondary | ICD-10-CM | POA: Diagnosis not present

## 2020-09-30 DIAGNOSIS — E785 Hyperlipidemia, unspecified: Secondary | ICD-10-CM | POA: Diagnosis not present

## 2020-09-30 DIAGNOSIS — I10 Essential (primary) hypertension: Secondary | ICD-10-CM | POA: Diagnosis not present

## 2020-09-30 DIAGNOSIS — D0511 Intraductal carcinoma in situ of right breast: Secondary | ICD-10-CM | POA: Diagnosis not present

## 2020-10-02 DIAGNOSIS — J9610 Chronic respiratory failure, unspecified whether with hypoxia or hypercapnia: Secondary | ICD-10-CM | POA: Diagnosis not present

## 2020-10-02 DIAGNOSIS — J449 Chronic obstructive pulmonary disease, unspecified: Secondary | ICD-10-CM | POA: Diagnosis not present

## 2020-10-02 DIAGNOSIS — R0902 Hypoxemia: Secondary | ICD-10-CM | POA: Diagnosis not present

## 2020-11-02 DIAGNOSIS — J449 Chronic obstructive pulmonary disease, unspecified: Secondary | ICD-10-CM | POA: Diagnosis not present

## 2020-11-02 DIAGNOSIS — J9610 Chronic respiratory failure, unspecified whether with hypoxia or hypercapnia: Secondary | ICD-10-CM | POA: Diagnosis not present

## 2020-11-02 DIAGNOSIS — R0902 Hypoxemia: Secondary | ICD-10-CM | POA: Diagnosis not present

## 2020-12-02 DIAGNOSIS — J449 Chronic obstructive pulmonary disease, unspecified: Secondary | ICD-10-CM | POA: Diagnosis not present

## 2020-12-02 DIAGNOSIS — R0902 Hypoxemia: Secondary | ICD-10-CM | POA: Diagnosis not present

## 2020-12-02 DIAGNOSIS — J9610 Chronic respiratory failure, unspecified whether with hypoxia or hypercapnia: Secondary | ICD-10-CM | POA: Diagnosis not present

## 2020-12-31 DIAGNOSIS — K006 Disturbances in tooth eruption: Secondary | ICD-10-CM | POA: Diagnosis not present

## 2021-01-14 ENCOUNTER — Other Ambulatory Visit: Payer: Self-pay | Admitting: Critical Care Medicine

## 2021-01-15 ENCOUNTER — Telehealth: Payer: Self-pay | Admitting: Primary Care

## 2021-01-15 MED ORDER — TRELEGY ELLIPTA 100-62.5-25 MCG/ACT IN AEPB: 100.0000 ug | INHALATION_SPRAY | Freq: Every day | RESPIRATORY_TRACT | 0 refills | Status: DC

## 2021-01-15 NOTE — Telephone Encounter (Signed)
Trelegy 100 sent in to pharmacy nothing further needed.

## 2021-01-15 NOTE — Telephone Encounter (Signed)
This is a duplicate message. Please see phone note 01/15/21.   Nothing further needed at this time.

## 2021-01-18 ENCOUNTER — Telehealth: Payer: Self-pay | Admitting: Primary Care

## 2021-01-19 NOTE — Telephone Encounter (Signed)
We do not have any samples of Trelegy 100. Called and spoke with pt letting her know this info. Asked if she had applied for pt assistance to see if she could be approved and she stated that she had and stated that the Rx should arrive to her in about 4 days. Nothing further needed.

## 2021-01-19 NOTE — Telephone Encounter (Signed)
Patient checking on samples of Trelegy. Patient phone number is [phone].

## 2021-01-21 DIAGNOSIS — D638 Anemia in other chronic diseases classified elsewhere: Secondary | ICD-10-CM | POA: Diagnosis not present

## 2021-01-21 DIAGNOSIS — K219 Gastro-esophageal reflux disease without esophagitis: Secondary | ICD-10-CM | POA: Diagnosis not present

## 2021-01-21 DIAGNOSIS — D0511 Intraductal carcinoma in situ of right breast: Secondary | ICD-10-CM | POA: Diagnosis not present

## 2021-01-21 DIAGNOSIS — D500 Iron deficiency anemia secondary to blood loss (chronic): Secondary | ICD-10-CM | POA: Diagnosis not present

## 2021-01-21 DIAGNOSIS — E785 Hyperlipidemia, unspecified: Secondary | ICD-10-CM | POA: Diagnosis not present

## 2021-01-21 DIAGNOSIS — N183 Chronic kidney disease, stage 3 unspecified: Secondary | ICD-10-CM | POA: Diagnosis not present

## 2021-01-21 DIAGNOSIS — I10 Essential (primary) hypertension: Secondary | ICD-10-CM | POA: Diagnosis not present

## 2021-01-21 DIAGNOSIS — I5032 Chronic diastolic (congestive) heart failure: Secondary | ICD-10-CM | POA: Diagnosis not present

## 2021-01-21 DIAGNOSIS — I480 Paroxysmal atrial fibrillation: Secondary | ICD-10-CM | POA: Diagnosis not present

## 2021-02-15 ENCOUNTER — Observation Stay (HOSPITAL_COMMUNITY): Payer: Medicare HMO

## 2021-02-15 ENCOUNTER — Inpatient Hospital Stay (HOSPITAL_COMMUNITY)
Admission: EM | Admit: 2021-02-15 | Discharge: 2021-02-19 | DRG: 191 | Disposition: A | Discharge status: Home Health | Payer: Medicare HMO | Attending: Family Medicine | Admitting: Family Medicine

## 2021-02-15 ENCOUNTER — Emergency Department (HOSPITAL_COMMUNITY): Payer: Medicare HMO

## 2021-02-15 ENCOUNTER — Other Ambulatory Visit: Payer: Self-pay

## 2021-02-15 ENCOUNTER — Encounter (HOSPITAL_COMMUNITY): Payer: Self-pay | Admitting: Emergency Medicine

## 2021-02-15 DIAGNOSIS — Z9842 Cataract extraction status, left eye: Secondary | ICD-10-CM | POA: Diagnosis not present

## 2021-02-15 DIAGNOSIS — Z20822 Contact with and (suspected) exposure to covid-19: Secondary | ICD-10-CM | POA: Diagnosis not present

## 2021-02-15 DIAGNOSIS — N179 Acute kidney failure, unspecified: Secondary | ICD-10-CM | POA: Diagnosis not present

## 2021-02-15 DIAGNOSIS — N184 Chronic kidney disease, stage 4 (severe): Secondary | ICD-10-CM | POA: Diagnosis present

## 2021-02-15 DIAGNOSIS — K219 Gastro-esophageal reflux disease without esophagitis: Secondary | ICD-10-CM | POA: Diagnosis present

## 2021-02-15 DIAGNOSIS — R042 Hemoptysis: Secondary | ICD-10-CM | POA: Diagnosis not present

## 2021-02-15 DIAGNOSIS — Z9981 Dependence on supplemental oxygen: Secondary | ICD-10-CM

## 2021-02-15 DIAGNOSIS — E785 Hyperlipidemia, unspecified: Secondary | ICD-10-CM | POA: Diagnosis present

## 2021-02-15 DIAGNOSIS — E861 Hypovolemia: Secondary | ICD-10-CM | POA: Diagnosis present

## 2021-02-15 DIAGNOSIS — Z853 Personal history of malignant neoplasm of breast: Secondary | ICD-10-CM

## 2021-02-15 DIAGNOSIS — I10 Essential (primary) hypertension: Secondary | ICD-10-CM | POA: Diagnosis present

## 2021-02-15 DIAGNOSIS — J439 Emphysema, unspecified: Secondary | ICD-10-CM | POA: Diagnosis not present

## 2021-02-15 DIAGNOSIS — Z87891 Personal history of nicotine dependence: Secondary | ICD-10-CM

## 2021-02-15 DIAGNOSIS — D638 Anemia in other chronic diseases classified elsewhere: Secondary | ICD-10-CM | POA: Diagnosis present

## 2021-02-15 DIAGNOSIS — Z825 Family history of asthma and other chronic lower respiratory diseases: Secondary | ICD-10-CM | POA: Diagnosis not present

## 2021-02-15 DIAGNOSIS — I5030 Unspecified diastolic (congestive) heart failure: Secondary | ICD-10-CM | POA: Diagnosis present

## 2021-02-15 DIAGNOSIS — Z9841 Cataract extraction status, right eye: Secondary | ICD-10-CM | POA: Diagnosis not present

## 2021-02-15 DIAGNOSIS — J9611 Chronic respiratory failure with hypoxia: Secondary | ICD-10-CM | POA: Diagnosis present

## 2021-02-15 DIAGNOSIS — J441 Chronic obstructive pulmonary disease with (acute) exacerbation: Secondary | ICD-10-CM | POA: Diagnosis present

## 2021-02-15 DIAGNOSIS — I959 Hypotension, unspecified: Secondary | ICD-10-CM | POA: Diagnosis not present

## 2021-02-15 DIAGNOSIS — I4821 Permanent atrial fibrillation: Secondary | ICD-10-CM | POA: Diagnosis not present

## 2021-02-15 DIAGNOSIS — Z6832 Body mass index (BMI) 32.0-32.9, adult: Secondary | ICD-10-CM

## 2021-02-15 DIAGNOSIS — I5032 Chronic diastolic (congestive) heart failure: Secondary | ICD-10-CM | POA: Diagnosis not present

## 2021-02-15 DIAGNOSIS — R059 Cough, unspecified: Secondary | ICD-10-CM | POA: Diagnosis not present

## 2021-02-15 DIAGNOSIS — E440 Moderate protein-calorie malnutrition: Secondary | ICD-10-CM | POA: Diagnosis present

## 2021-02-15 DIAGNOSIS — E611 Iron deficiency: Secondary | ICD-10-CM | POA: Diagnosis present

## 2021-02-15 DIAGNOSIS — I272 Pulmonary hypertension, unspecified: Secondary | ICD-10-CM | POA: Diagnosis not present

## 2021-02-15 DIAGNOSIS — K8020 Calculus of gallbladder without cholecystitis without obstruction: Secondary | ICD-10-CM | POA: Diagnosis not present

## 2021-02-15 DIAGNOSIS — Z79899 Other long term (current) drug therapy: Secondary | ICD-10-CM | POA: Diagnosis not present

## 2021-02-15 DIAGNOSIS — I130 Hypertensive heart and chronic kidney disease with heart failure and stage 1 through stage 4 chronic kidney disease, or unspecified chronic kidney disease: Secondary | ICD-10-CM | POA: Diagnosis present

## 2021-02-15 DIAGNOSIS — Z7901 Long term (current) use of anticoagulants: Secondary | ICD-10-CM | POA: Diagnosis not present

## 2021-02-15 HISTORY — DX: Acute kidney failure, unspecified: N17.9

## 2021-02-15 LAB — PROTIME-INR
INR: 1.2 (ref 0.8–1.2)
Prothrombin Time: 15.7 seconds — ABNORMAL HIGH (ref 11.4–15.2)

## 2021-02-15 LAB — COMPREHENSIVE METABOLIC PANEL
ALT: 8 U/L (ref 0–44)
AST: 12 U/L — ABNORMAL LOW (ref 15–41)
Albumin: 3.1 g/dL — ABNORMAL LOW (ref 3.5–5.0)
Alkaline Phosphatase: 87 U/L (ref 38–126)
Anion gap: 12 (ref 5–15)
BUN: 34 mg/dL — ABNORMAL HIGH (ref 8–23)
CO2: 18 mmol/L — ABNORMAL LOW (ref 22–32)
Calcium: 9.1 mg/dL (ref 8.9–10.3)
Chloride: 109 mmol/L (ref 98–111)
Creatinine, Ser: 2.1 mg/dL — ABNORMAL HIGH (ref 0.44–1.00)
GFR, Estimated: 23 mL/min — ABNORMAL LOW (ref >60)
Glucose, Bld: 101 mg/dL — ABNORMAL HIGH (ref 70–99)
Potassium: 4.5 mmol/L (ref 3.5–5.1)
Sodium: 139 mmol/L (ref 135–145)
Total Bilirubin: 0.9 mg/dL (ref 0.3–1.2)
Total Protein: 7 g/dL (ref 6.5–8.1)

## 2021-02-15 LAB — CBC
HCT: 32 % — ABNORMAL LOW (ref 36.0–46.0)
Hemoglobin: 9.3 g/dL — ABNORMAL LOW (ref 12.0–15.0)
MCH: 29.4 pg (ref 26.0–34.0)
MCHC: 29.1 g/dL — ABNORMAL LOW (ref 30.0–36.0)
MCV: 101.3 fL — ABNORMAL HIGH (ref 80.0–100.0)
Platelets: 224 10*3/uL (ref 150–400)
RBC: 3.16 MIL/uL — ABNORMAL LOW (ref 3.87–5.11)
RDW: 13.5 % (ref 11.5–15.5)
WBC: 8.3 10*3/uL (ref 4.0–10.5)
nRBC: 0 % (ref 0.0–0.2)

## 2021-02-15 LAB — HIV ANTIBODY (ROUTINE TESTING W REFLEX): HIV Screen 4th Generation wRfx: NONREACTIVE

## 2021-02-15 LAB — IRON AND TIBC
Iron: 10 ug/dL — ABNORMAL LOW (ref 28–170)
Saturation Ratios: 4 % — ABNORMAL LOW (ref 10.4–31.8)
TIBC: 224 ug/dL — ABNORMAL LOW (ref 250–450)
UIBC: 214 ug/dL

## 2021-02-15 LAB — RESP PANEL BY RT-PCR (FLU A&B, COVID) ARPGX2
Influenza A by PCR: NEGATIVE
Influenza B by PCR: NEGATIVE
SARS Coronavirus 2 by RT PCR: NEGATIVE

## 2021-02-15 LAB — TYPE AND SCREEN
ABO/RH(D): A POS
Antibody Screen: NEGATIVE

## 2021-02-15 LAB — FOLATE: Folate: 11.3 ng/mL (ref >5.9)

## 2021-02-15 LAB — LACTIC ACID, PLASMA: Lactic Acid, Venous: 1.4 mmol/L (ref 0.5–1.9)

## 2021-02-15 LAB — VITAMIN B12: Vitamin B-12: 1013 pg/mL — ABNORMAL HIGH (ref 180–914)

## 2021-02-15 LAB — FERRITIN: Ferritin: 81 ng/mL (ref 11–307)

## 2021-02-15 MED ORDER — AMLODIPINE BESYLATE 5 MG PO TABS
5.0000 mg | ORAL_TABLET | Freq: Every day | ORAL | Status: DC
  Administered 2021-02-15 – 2021-02-19 (×5): 5 mg via ORAL
  Filled 2021-02-15 (×5): qty 1

## 2021-02-15 MED ORDER — AZITHROMYCIN 250 MG PO TABS
500.0000 mg | ORAL_TABLET | Freq: Every day | ORAL | Status: AC
  Administered 2021-02-16 – 2021-02-19 (×4): 500 mg via ORAL
  Filled 2021-02-15 (×4): qty 2

## 2021-02-15 MED ORDER — UMECLIDINIUM BROMIDE 62.5 MCG/ACT IN AEPB
1.0000 | INHALATION_SPRAY | Freq: Every day | RESPIRATORY_TRACT | Status: DC
  Administered 2021-02-15 – 2021-02-19 (×5): 1 via RESPIRATORY_TRACT
  Filled 2021-02-15: qty 7

## 2021-02-15 MED ORDER — BUDESONIDE 0.5 MG/2ML IN SUSP
2.0000 mg | Freq: Two times a day (BID) | RESPIRATORY_TRACT | Status: DC
  Administered 2021-02-15: 2 mg via RESPIRATORY_TRACT
  Filled 2021-02-15 (×2): qty 8

## 2021-02-15 MED ORDER — METOPROLOL TARTRATE 25 MG PO TABS
25.0000 mg | ORAL_TABLET | Freq: Two times a day (BID) | ORAL | Status: DC
  Administered 2021-02-15 – 2021-02-19 (×8): 25 mg via ORAL
  Filled 2021-02-15 (×8): qty 1

## 2021-02-15 MED ORDER — PREDNISONE 20 MG PO TABS
40.0000 mg | ORAL_TABLET | Freq: Every day | ORAL | Status: DC
  Administered 2021-02-16: 40 mg via ORAL
  Filled 2021-02-15: qty 2

## 2021-02-15 MED ORDER — FLUTICASONE FUROATE-VILANTEROL 100-25 MCG/ACT IN AEPB
1.0000 | INHALATION_SPRAY | Freq: Every day | RESPIRATORY_TRACT | Status: DC
  Administered 2021-02-15 – 2021-02-19 (×5): 1 via RESPIRATORY_TRACT
  Filled 2021-02-15: qty 28

## 2021-02-15 MED ORDER — ALBUTEROL SULFATE (2.5 MG/3ML) 0.083% IN NEBU: 2.5000 mg | INHALATION_SOLUTION | RESPIRATORY_TRACT | Status: DC | PRN

## 2021-02-15 MED ORDER — VITAMIN B-12 1000 MCG PO TABS
1000.0000 ug | ORAL_TABLET | Freq: Every day | ORAL | Status: DC
  Administered 2021-02-16 – 2021-02-19 (×4): 1000 ug via ORAL
  Filled 2021-02-15 (×4): qty 1

## 2021-02-15 MED ORDER — SODIUM CHLORIDE 0.9 % IV SOLN: INTRAVENOUS | Status: AC

## 2021-02-15 MED ORDER — IPRATROPIUM-ALBUTEROL 0.5-2.5 (3) MG/3ML IN SOLN
3.0000 mL | Freq: Four times a day (QID) | RESPIRATORY_TRACT | Status: DC
  Administered 2021-02-15 – 2021-02-16 (×2): 3 mL via RESPIRATORY_TRACT
  Filled 2021-02-15 (×2): qty 3

## 2021-02-15 MED ORDER — HYDRALAZINE HCL 25 MG PO TABS: 25.0000 mg | ORAL_TABLET | Freq: Four times a day (QID) | ORAL | Status: DC | PRN

## 2021-02-15 MED ORDER — BENZONATATE 100 MG PO CAPS
100.0000 mg | ORAL_CAPSULE | Freq: Three times a day (TID) | ORAL | Status: AC
  Administered 2021-02-15 – 2021-02-18 (×9): 100 mg via ORAL
  Filled 2021-02-15 (×9): qty 1

## 2021-02-15 MED ORDER — ONDANSETRON HCL 4 MG/2ML IJ SOLN: 4.0000 mg | Freq: Four times a day (QID) | INTRAMUSCULAR | Status: DC | PRN

## 2021-02-15 MED ORDER — ACETAMINOPHEN 325 MG PO TABS: 650.0000 mg | ORAL_TABLET | Freq: Four times a day (QID) | ORAL | Status: DC | PRN

## 2021-02-15 MED ORDER — ROSUVASTATIN CALCIUM 20 MG PO TABS
40.0000 mg | ORAL_TABLET | Freq: Every day | ORAL | Status: DC
  Administered 2021-02-15 – 2021-02-19 (×5): 40 mg via ORAL
  Filled 2021-02-15 (×5): qty 2

## 2021-02-15 MED ORDER — SODIUM CHLORIDE 0.9 % IV SOLN
500.0000 mg | INTRAVENOUS | Status: AC
  Administered 2021-02-15: 500 mg via INTRAVENOUS
  Filled 2021-02-15: qty 5

## 2021-02-15 MED ORDER — FLUTICASONE-UMECLIDIN-VILANT 100-62.5-25 MCG/ACT IN AEPB: 100.0000 ug | INHALATION_SPRAY | Freq: Every day | RESPIRATORY_TRACT | Status: DC

## 2021-02-15 MED ORDER — VITAMIN D 25 MCG (1000 UNIT) PO TABS
2000.0000 [IU] | ORAL_TABLET | Freq: Every day | ORAL | Status: DC
  Administered 2021-02-15 – 2021-02-19 (×5): 2000 [IU] via ORAL
  Filled 2021-02-15 (×5): qty 2

## 2021-02-15 MED ORDER — VITAMIN B-12 100 MCG PO TABS
ORAL_TABLET | Freq: Every day | ORAL | Status: DC
  Administered 2021-02-15: 100 ug via ORAL
  Filled 2021-02-15: qty 1

## 2021-02-15 MED ORDER — LACTATED RINGERS IV BOLUS
1000.0000 mL | Freq: Once | INTRAVENOUS | Status: AC
  Administered 2021-02-15: 1000 mL via INTRAVENOUS

## 2021-02-15 MED ORDER — GUAIFENESIN ER 600 MG PO TB12
1200.0000 mg | ORAL_TABLET | Freq: Two times a day (BID) | ORAL | Status: DC
  Administered 2021-02-16 – 2021-02-19 (×6): 1200 mg via ORAL
  Filled 2021-02-15 (×7): qty 2

## 2021-02-15 NOTE — ED Triage Notes (Signed)
Patient BIB [facility] from home. Complaint of hemoptysis for 2-3 days, hypotensive today, 90s with EMS, 150s after 200cc NS. A&Ox4

## 2021-02-15 NOTE — H&P (Signed)
History and Physical    Courtney Cline ACZ:[id number] DOB: 1933/07/21 DOA: 02/15/2021  PCP: Leeroy Cha, MD (Confirm with patient/family/NH records and if not entered, this has to be entered at [facility] point of entry) Patient coming from: Home  I have personally briefly reviewed patient's old medical records in [place]  Chief Complaint: Coughing up blood, SOB  HPI: Courtney Cline is a 88 y.o. female with medical history significant of chronic hypoxic respite failure secondary to COPD on 3 L oxygen 24/7, chronic A. fib on Eliquis, chronic macrocytic anemia, chronic diastolic CHF, CKD stage II, moderate pulmonary hypertension, presented with cough, shortness of breath, hemoptysis and poor intake x 4 days.  Symptoms started 4 days ago, initially was dry cough then with whitish sputum, low subjective fever no chills.  Then 2 days ago, patient started to cough up whitish sputum mixed with streaks of blood.  Denies any chest pain, no lightheadedness.  For the last 2 days, patient also felt frequent nauseous and her eating and drinking symptomatic compromised by repeated cough and shortness of breath.  She has been using inhalers but no nebulizer at home.  She denies any vomiting, her urine color has been darker but she denied any dysuria.  ED Course: No hypoxia, afebrile.  Hemoglobin 9.3 is about her baseline.  Creatinine 2.1 compared to baseline 1.2 last year.  X-ray showed no acute infiltrate.  Review of Systems: As per HPI otherwise 14 point review of systems negative.    Past Medical History:  Diagnosis Date   Allergic rhinitis    Arthritis    Breast calcification seen on mammogram    Right breast   Breast cancer (HCC)    COPD (chronic obstructive pulmonary disease) (HCC)    GERD (gastroesophageal reflux disease)    Hyperglycemia    Hyperlipidemia    Hypertension    Low back pain    On home oxygen therapy    all the time   Osteopenia    Peripheral  edema    Shortness of breath    Subclavian artery stenosis, left ([place])    Vitamin D deficiency    Wears dentures    top   Wears glasses    reading    Past Surgical History:  Procedure Laterality Date   BREAST BIOPSY     BREAST LUMPECTOMY     right 2015   BREAST LUMPECTOMY WITH RADIOACTIVE SEED LOCALIZATION Right 12/20/2013   Procedure: RIGHT BREAST LUMPECTOMY WITH RADIOACTIVE SEED LOCALIZATION;  Surgeon: Erroll Luna, MD;  Location: [facility];  Service: General;  Laterality: Right;   CAROTID DUPLEX  2014   CATARACT EXTRACTION     both eyes     reports that she quit smoking about 27 years ago. Her smoking use included cigarettes. She has a 40.00 pack-year smoking history. She has never used smokeless tobacco. She reports that she does not drink alcohol and does not use drugs.  No Known Allergies  Family History  Problem Relation Age of Onset   Emphysema Mother    Emphysema Maternal Grandfather      Prior to Admission medications   Medication Sig Start Date End Date Taking? Authorizing Provider  acetaminophen (TYLENOL) 325 MG tablet Take 650 mg by mouth every 6 (six) hours as needed for mild pain or headache.    Yes [provider]  apixaban (ELIQUIS) 5 MG TABS tablet Take 1 tablet (5 mg total) by mouth 2 (two) times daily. 07/17/19  Yes Noemi Chapel P, DO  Cholecalciferol (VITAMIN D) 2000 UNITS CAPS Take 2,000 Units by mouth daily.    Yes [provider]  Cyanocobalamin (VITAMIN B 12 PO) Take 1 tablet by mouth daily.   Yes [provider]  Fluticasone-Umeclidin-Vilant (TRELEGY ELLIPTA) 100-62.5-25 MCG/ACT AEPB Inhale 100 mcg into the lungs daily. 01/15/21  Yes Martyn Ehrich, NP  furosemide (LASIX) 40 MG tablet Take 1 tablet (40 mg total) by mouth 2 (two) times daily. <PLEASE MAKE APPOINTMENT FOR REFILLS> Patient taking differently: Take 40 mg by mouth daily. 10/27/16  Yes Lorretta Harp, MD  lisinopril (PRINIVIL,ZESTRIL) 40 MG  tablet Take 40 mg by mouth daily.   Yes [provider]  metoprolol tartrate (LOPRESSOR) 25 MG tablet Take 1 tablet (25 mg total) by mouth 2 (two) times daily. 07/17/19  Yes Clark, Laura P, DO  PROAIR HFA 108 (90 Base) MCG/ACT inhaler TAKE 2 PUFFS BY MOUTH EVERY 6 HOURS AS NEEDED FOR WHEEZE OR SHORTNESS OF BREATH Patient taking differently: Inhale 2 puffs into the lungs every 6 (six) hours as needed for wheezing or shortness of breath. 08/16/17  Yes Juanito Doom, MD  rosuvastatin (CRESTOR) 40 MG tablet Take 40 mg by mouth daily. 02/02/21  Yes [provider]  Fluticasone-Umeclidin-Vilant (TRELEGY ELLIPTA) 100-62.5-25 MCG/ACT AEPB 1 PUFF DAILY Patient not taking: Reported on 02/15/2021 01/15/21   Martyn Ehrich, NP    Physical Exam: Vitals:   02/15/21 1700 02/15/21 1715 02/15/21 1730 02/15/21 1800  BP: (!) 132/119 (!) 133/55 (!) 139/57 (!) 131/45  Pulse: (!) 104 90 96 94  Resp: 14 (!) 29 16 (!) 21  Temp:      TempSrc:      SpO2: 95% 99% 100% 99%    Constitutional: NAD, calm, comfortable Vitals:   02/15/21 1700 02/15/21 1715 02/15/21 1730 02/15/21 1800  BP: (!) 132/119 (!) 133/55 (!) 139/57 (!) 131/45  Pulse: (!) 104 90 96 94  Resp: 14 (!) 29 16 (!) 21  Temp:      TempSrc:      SpO2: 95% 99% 100% 99%   Eyes: PERRL, lids and conjunctivae normal ENMT: Mucous membranes are dry. Posterior pharynx clear of any exudate or lesions.Normal dentition.  Neck: normal, supple, no masses, no thyromegaly Respiratory: Diminished breathing sound bilaterally, scattered wheezing, no crackles.  Increasing respiratory effort, talking in broken sentences no accessory muscle use.  Cardiovascular: Regular rate and rhythm, no murmurs / rubs / gallops. No extremity edema. 2+ pedal pulses. No carotid bruits.  Abdomen: no tenderness, no masses palpated. No hepatosplenomegaly. Bowel sounds positive.  Musculoskeletal: no clubbing / cyanosis. No joint deformity upper and lower extremities.  Good ROM, no contractures. Normal muscle tone.  Skin: no rashes, lesions, ulcers. No induration Neurologic: CN 2-12 grossly intact. Sensation intact, DTR normal. Strength 5/5 in all 4.  Psychiatric: Normal judgment and insight. Alert and oriented x 3. Normal mood.   (  Labs on Admission: I have personally reviewed following labs and imaging studies  CBC: Recent Labs  Lab 02/15/21 1339  WBC 8.3  HGB 9.3*  HCT 32.0*  MCV 101.3*  PLT 301   Basic Metabolic Panel: Recent Labs  Lab 02/15/21 1339  NA 139  K 4.5  CL 109  CO2 18*  GLUCOSE 101*  BUN 34*  CREATININE 2.10*  CALCIUM 9.1   GFR: CrCl cannot be calculated (Unknown ideal weight.). Liver Function Tests: Recent Labs  Lab 02/15/21 1339  AST 12*  ALT 8  ALKPHOS 87  BILITOT 0.9  PROT 7.0  ALBUMIN 3.1*   No results for input(s): LIPASE, AMYLASE in the last 168 hours. No results for input(s): AMMONIA in the last 168 hours. Coagulation Profile: Recent Labs  Lab 02/15/21 1339  INR 1.2   Cardiac Enzymes: No results for input(s): CKTOTAL, CKMB, CKMBINDEX, TROPONINI in the last 168 hours. BNP (last 3 results) No results for input(s): PROBNP in the last 8760 hours. HbA1C: No results for input(s): HGBA1C in the last 72 hours. CBG: No results for input(s): GLUCAP in the last 168 hours. Lipid Profile: No results for input(s): CHOL, HDL, LDLCALC, TRIG, CHOLHDL, LDLDIRECT in the last 72 hours. Thyroid Function Tests: No results for input(s): TSH, T4TOTAL, FREET4, T3FREE, THYROIDAB in the last 72 hours. Anemia Panel: No results for input(s): VITAMINB12, FOLATE, FERRITIN, TIBC, IRON, RETICCTPCT in the last 72 hours. Urine analysis:    Component Value Date/Time   COLORURINE AMBER (A) 01/20/2020 1746   APPEARANCEUR CLOUDY (A) 01/20/2020 1746   LABSPEC 1.020 01/20/2020 1746   PHURINE 5.0 01/20/2020 1746   GLUCOSEU NEGATIVE 01/20/2020 1746   HGBUR MODERATE (A) 01/20/2020 1746   BILIRUBINUR NEGATIVE 01/20/2020 1746    KETONESUR 5 (A) 01/20/2020 1746   PROTEINUR 30 (A) 01/20/2020 1746   NITRITE NEGATIVE 01/20/2020 1746   LEUKOCYTESUR LARGE (A) 01/20/2020 1746    Radiological Exams on Admission: DG Chest 2 View  Result Date: 02/15/2021 CLINICAL DATA:  Hemoptysis.  Suspected sepsis. EXAM: CHEST - 2 VIEW COMPARISON:  08/31/2020 FINDINGS: Chronic densities at the right lung base. Upper lungs are clear. Heart and mediastinum are within normal limits. Atherosclerotic calcifications at the aortic arch. No large pleural effusions. Chronic hyperinflation with emphysematous changes. IMPRESSION: 1. No acute cardiopulmonary disease. 2. Emphysema with chronic densities at the right lung base. Electronically Signed   By: Markus Daft M.D.   On: 02/15/2021 14:08    EKG: Ordered  Assessment/Plan Principal Problem:   AKI (acute kidney injury) ([place])  (please populate well all problems here in Problem List. (For example, if patient is on BP meds at home and you resume or decide to hold them, it is a problem that needs to be her. Same for CAD, COPD, HLD and so on)  AKI on CKD stage II -Likely prerenal secondary to poor oral intake, clinically appears to be dehydrated -Gentle hydration x12 hours then reevaluate -Hold Lasix, hold lisinopril -Renal ultrasound and UA pending.  Hemoptysis -Likely secondary to worsening of cough from COPD exacerbation and being on Eliquis. Given the onset of hemoptysis onset after 2 days of severe cough and COPD exacerbation, will conservatively manage for now if H&H remained stable, I do not expect she will need bronchoscopy.  Otherwise we will consider consult pulmonary tomorrow. -H&H no significant drop compared to baseline -Recheck H&H tonight and tomorrow morning, hold Eliquis for today.  No chemical DVT prophylaxis today. -Other Ddx, CHF less likely given her volume status on the low side, gentle hydration, will check echo since her most recent echo was 2016. GI bleed unlikely given the  clinical presentation.  Chronic macrocytic anemia -Check iron study and reticulocyte count.  Acute COPD exacerbation -Breathing treatments, nebulizer, short course of p.o. steroid -Continue oxygen support. -Incentive spectrometry and flutter valve -Short course of azithromycin -PT in AM.  HTN -Hold Lasix and lisinopril -Continue metoprolol, add as needed hydralazine.  Chronic diastolic CHF -Mild hypovolemic and AKI, hold Lasix  PAF -In sinus rhythm -Hold Eliquis as mentioned above.  DVT  prophylaxis: SCD Code Status: Full code Family Communication: 2 sons at bedside Disposition Plan: Expect less than 2 midnight hospital stay, if H&H stable and creatinine level stable or improved likely can go home tomorrow. Consults called: [facility]One Admission status: Tele obs   Lequita Halt MD [facility] Pager [phone]  02/15/2021, 6:41 PM

## 2021-02-15 NOTE — ED Notes (Signed)
Patient transported to Ultrasound 

## 2021-02-15 NOTE — ED Provider Notes (Signed)
Emergency Medicine Provider Triage Evaluation Note  Courtney Cline , a 88 y.o. female  was evaluated in triage.  Pt complains of hemoptysis that started today.  Patient's been feeling unwell at home with productive cough, general malaise, myalgias. No abdominal pain, nausea, vomiting, diarrhea.   Review of Systems  Positive:  Negative: See above   Physical Exam  BP (!) 143/50    Pulse (!) 114    Temp 98.3 F (36.8 C) (Oral)    Resp 19    SpO2 98%  Gen:   Awake, no distress   Resp:  Normal effort  MSK:   Moves extremities without difficulty  Other:    Medical Decision Making  Medically screening exam initiated at 1:25 PM.  Appropriate orders placed.  Courtney Cline was informed that the remainder of the evaluation will be completed by another provider, this initial triage assessment does not replace that evaluation, and the importance of remaining in the ED until their evaluation is complete.     Courtney Cline [place], PA-C 02/15/21 1328    Godfrey Pick, MD 02/17/21 [phone]

## 2021-02-15 NOTE — ED Provider Notes (Signed)
[facility] EMERGENCY DEPARTMENT Provider Note   CSN: [id number] Arrival date & time: 02/15/21  1310     History  Chief Complaint  Patient presents with   Hemoptysis   Hypotension    Courtney Cline is a 88 y.o. female with past medical history significant for COPD on 3 L nasal cannula at home, A. fib on Eliquis, HTN, HLD who presents with cough, congestion, headaches, and hemoptysis.  Patient states that her cough and congestion started about 4 days ago.  Yesterday she started coughing up streaks of blood.  Prior to that it has been yellow.  She has had headaches intermittently throughout the illness.  She denies any fevers.  She also reports anorexia and decreased p.o. intake   Home Medications Prior to Admission medications   Medication Sig Start Date End Date Taking? Authorizing Provider  acetaminophen (TYLENOL) 325 MG tablet Take 650 mg by mouth every 6 (six) hours as needed for mild pain or headache.     [provider]  apixaban (ELIQUIS) 5 MG TABS tablet Take 1 tablet (5 mg total) by mouth 2 (two) times daily. 07/17/19   Julian Hy, DO  Cholecalciferol (VITAMIN D) 2000 UNITS CAPS Take 2,000 Units by mouth daily.     [provider]  Cyanocobalamin (VITAMIN B 12 PO) Take 1 tablet by mouth daily.    [provider]  DiphenhydrAMINE HCl (BENADRYL PO) Take 1 tablet by mouth daily as needed (allergy).     [provider]  famotidine (PEPCID) 20 MG tablet Take 20 mg by mouth every other day.  08/21/18   [provider]  Fluticasone-Umeclidin-Vilant (TRELEGY ELLIPTA) 100-62.5-25 MCG/ACT AEPB 1 PUFF DAILY 01/15/21   Martyn Ehrich, NP  Fluticasone-Umeclidin-Vilant (TRELEGY ELLIPTA) 100-62.5-25 MCG/ACT AEPB Inhale 100 mcg into the lungs daily. 01/15/21   Martyn Ehrich, NP  furosemide (LASIX) 40 MG tablet Take 1 tablet (40 mg total) by mouth 2 (two) times daily. <PLEASE MAKE APPOINTMENT FOR REFILLS> Patient  taking differently: Take 40 mg by mouth daily. 10/27/16   Lorretta Harp, MD  lisinopril (PRINIVIL,ZESTRIL) 40 MG tablet Take 40 mg by mouth daily.    [provider]  metoprolol tartrate (LOPRESSOR) 25 MG tablet Take 1 tablet (25 mg total) by mouth 2 (two) times daily. 07/17/19   Noemi Chapel P, DO  PROAIR HFA 108 (90 Base) MCG/ACT inhaler TAKE 2 PUFFS BY MOUTH EVERY 6 HOURS AS NEEDED FOR WHEEZE OR SHORTNESS OF BREATH Patient taking differently: Inhale 2 puffs into the lungs every 6 (six) hours as needed for wheezing or shortness of breath. 08/16/17   Juanito Doom, MD      Allergies    Patient has no known allergies.    Review of Systems   Review of Systems  Constitutional:  Positive for appetite change. Negative for chills and fever.  HENT:  Positive for congestion. Negative for ear pain and sore throat.   Eyes:  Negative for pain and visual disturbance.  Respiratory:  Positive for cough and shortness of breath.   Cardiovascular:  Negative for chest pain and palpitations.  Gastrointestinal:  Negative for abdominal pain and vomiting.  Genitourinary:  Positive for decreased urine volume. Negative for dysuria and hematuria.  Musculoskeletal:  Negative for arthralgias and back pain.  Skin:  Negative for color change and rash.  Neurological:  Positive for headaches. Negative for seizures and syncope.  All other systems reviewed and are negative.  Physical Exam Updated  Vital Signs BP (!) 143/50    Pulse (!) 114    Temp 98.3 F (36.8 C) (Oral)    Resp 19    SpO2 98%  Physical Exam Vitals and nursing note reviewed.  Constitutional:      General: She is not in acute distress.    Appearance: She is well-developed.  HENT:     Head: Normocephalic and atraumatic.     Right Ear: External ear normal.     Left Ear: External ear normal.     Nose: Nose normal.     Mouth/Throat:     Mouth: Mucous membranes are dry.     Pharynx: Oropharynx is clear.  Eyes:     Extraocular  Movements: Extraocular movements intact.     Conjunctiva/sclera: Conjunctivae normal.     Pupils: Pupils are equal, round, and reactive to light.  Cardiovascular:     Rate and Rhythm: Normal rate and regular rhythm.     Pulses: Normal pulses.     Heart sounds: Normal heart sounds. No murmur heard. Pulmonary:     Effort: Pulmonary effort is normal. No respiratory distress.     Breath sounds: Decreased breath sounds present.     Comments: Diminished breath sounds diffusely likely secondary to severe emphysema Abdominal:     Palpations: Abdomen is soft.     Tenderness: There is no abdominal tenderness.  Musculoskeletal:        General: No swelling.     Cervical back: Neck supple.  Skin:    General: Skin is warm and dry.     Capillary Refill: Capillary refill takes 2 to 3 seconds.  Neurological:     Mental Status: She is alert.  Psychiatric:        Mood and Affect: Mood normal.    ED Results / Procedures / Treatments   Labs (all labs ordered are listed, but only abnormal results are displayed) Labs Reviewed  COMPREHENSIVE METABOLIC PANEL - Abnormal; Notable for the following components:      Result Value   CO2 18 (*)    Glucose, Bld 101 (*)    BUN 34 (*)    Creatinine, Ser 2.10 (*)    Albumin 3.1 (*)    AST 12 (*)    GFR, Estimated 23 (*)    All other components within normal limits  CBC - Abnormal; Notable for the following components:   RBC 3.16 (*)    Hemoglobin 9.3 (*)    HCT 32.0 (*)    MCV 101.3 (*)    MCHC 29.1 (*)    All other components within normal limits  PROTIME-INR - Abnormal; Notable for the following components:   Prothrombin Time 15.7 (*)    All other components within normal limits  CULTURE, BLOOD (ROUTINE X 2)  CULTURE, BLOOD (ROUTINE X 2)  RESP PANEL BY RT-PCR (FLU A&B, COVID) ARPGX2  LACTIC ACID, PLASMA  LACTIC ACID, PLASMA  URINALYSIS, ROUTINE W REFLEX MICROSCOPIC  TYPE AND SCREEN  ABO/RH    EKG None  Radiology DG Chest 2  View  Result Date: 02/15/2021 CLINICAL DATA:  Hemoptysis.  Suspected sepsis. EXAM: CHEST - 2 VIEW COMPARISON:  08/31/2020 FINDINGS: Chronic densities at the right lung base. Upper lungs are clear. Heart and mediastinum are within normal limits. Atherosclerotic calcifications at the aortic arch. No large pleural effusions. Chronic hyperinflation with emphysematous changes. IMPRESSION: 1. No acute cardiopulmonary disease. 2. Emphysema with chronic densities at the right lung base. Electronically Signed   By:  Markus Daft M.D.   On: 02/15/2021 14:08    Procedures Procedures   Medications Ordered in ED Medications - No data to display  ED Course/ Medical Decision Making/ A&P                            Patient presents with URI symptoms as described in HPI above.  Physical exam is largely unremarkable.  She has diffusely diminished breath sounds, but I do not appreciate any wheezing to suggest COPD exacerbation.  She is saturating in the high 90s on 2 L nasal cannula despite reporting using 3 L at home.  CBC without leukocytosis making pneumonia and COPD exacerbation less likely.  CXR with emphysematous changes but no acute cardiopulmonary abnormalities.  Specifically no focal consolidation to suggest pneumonia.  The patient is having some hemoptysis but she takes a blood thinner and is not hypoxic on her home O2.  She was tachycardic initially in triage, but is not tachycardic on my evaluation.  I do not have high suspicion for pulmonary embolism as this more likely sounds like a viral illness.  The patient has a significant AKI on her metabolic panel which is likely due to poor p.o. intake in the setting of her illness.  Liter bolus ordered for fluid resuscitation in the emergency department.  I believe the patient requires admission for her AKI and possibly further work-up for her hemoptysis.  I discussed the patient with hospitalist medicine who will admit the patient.  Final Clinical Impression(s) / ED  Diagnoses Final diagnoses:  AKI (acute kidney injury) [facility])    Rx / DC Orders ED Discharge Orders     None         Rosealie Reach, Amalia Hailey, MD 02/15/21 2014    Sherwood Gambler, MD 02/18/21 [phone]

## 2021-02-16 ENCOUNTER — Observation Stay (HOSPITAL_COMMUNITY): Payer: Medicare HMO

## 2021-02-16 ENCOUNTER — Encounter (HOSPITAL_COMMUNITY): Payer: Self-pay | Admitting: Internal Medicine

## 2021-02-16 DIAGNOSIS — Z20822 Contact with and (suspected) exposure to covid-19: Secondary | ICD-10-CM | POA: Diagnosis present

## 2021-02-16 DIAGNOSIS — E440 Moderate protein-calorie malnutrition: Secondary | ICD-10-CM | POA: Diagnosis present

## 2021-02-16 DIAGNOSIS — Z6832 Body mass index (BMI) 32.0-32.9, adult: Secondary | ICD-10-CM | POA: Diagnosis not present

## 2021-02-16 DIAGNOSIS — Z9981 Dependence on supplemental oxygen: Secondary | ICD-10-CM | POA: Diagnosis not present

## 2021-02-16 DIAGNOSIS — Z853 Personal history of malignant neoplasm of breast: Secondary | ICD-10-CM | POA: Diagnosis not present

## 2021-02-16 DIAGNOSIS — I4821 Permanent atrial fibrillation: Secondary | ICD-10-CM | POA: Diagnosis present

## 2021-02-16 DIAGNOSIS — I130 Hypertensive heart and chronic kidney disease with heart failure and stage 1 through stage 4 chronic kidney disease, or unspecified chronic kidney disease: Secondary | ICD-10-CM | POA: Diagnosis present

## 2021-02-16 DIAGNOSIS — Z9841 Cataract extraction status, right eye: Secondary | ICD-10-CM | POA: Diagnosis not present

## 2021-02-16 DIAGNOSIS — R042 Hemoptysis: Secondary | ICD-10-CM | POA: Diagnosis present

## 2021-02-16 DIAGNOSIS — I272 Pulmonary hypertension, unspecified: Secondary | ICD-10-CM | POA: Diagnosis present

## 2021-02-16 DIAGNOSIS — Z7901 Long term (current) use of anticoagulants: Secondary | ICD-10-CM | POA: Diagnosis not present

## 2021-02-16 DIAGNOSIS — Z9842 Cataract extraction status, left eye: Secondary | ICD-10-CM | POA: Diagnosis not present

## 2021-02-16 DIAGNOSIS — J439 Emphysema, unspecified: Secondary | ICD-10-CM | POA: Diagnosis present

## 2021-02-16 DIAGNOSIS — I5032 Chronic diastolic (congestive) heart failure: Secondary | ICD-10-CM | POA: Diagnosis present

## 2021-02-16 DIAGNOSIS — Z87891 Personal history of nicotine dependence: Secondary | ICD-10-CM | POA: Diagnosis not present

## 2021-02-16 DIAGNOSIS — J9611 Chronic respiratory failure with hypoxia: Secondary | ICD-10-CM | POA: Diagnosis present

## 2021-02-16 DIAGNOSIS — J441 Chronic obstructive pulmonary disease with (acute) exacerbation: Secondary | ICD-10-CM | POA: Diagnosis present

## 2021-02-16 DIAGNOSIS — D638 Anemia in other chronic diseases classified elsewhere: Secondary | ICD-10-CM | POA: Diagnosis present

## 2021-02-16 DIAGNOSIS — E861 Hypovolemia: Secondary | ICD-10-CM | POA: Diagnosis present

## 2021-02-16 DIAGNOSIS — Z825 Family history of asthma and other chronic lower respiratory diseases: Secondary | ICD-10-CM | POA: Diagnosis not present

## 2021-02-16 DIAGNOSIS — E611 Iron deficiency: Secondary | ICD-10-CM | POA: Diagnosis present

## 2021-02-16 DIAGNOSIS — K219 Gastro-esophageal reflux disease without esophagitis: Secondary | ICD-10-CM | POA: Diagnosis present

## 2021-02-16 DIAGNOSIS — Z79899 Other long term (current) drug therapy: Secondary | ICD-10-CM | POA: Diagnosis not present

## 2021-02-16 DIAGNOSIS — N184 Chronic kidney disease, stage 4 (severe): Secondary | ICD-10-CM | POA: Diagnosis present

## 2021-02-16 DIAGNOSIS — E785 Hyperlipidemia, unspecified: Secondary | ICD-10-CM | POA: Diagnosis present

## 2021-02-16 LAB — RETICULOCYTES
Immature Retic Fract: 13.3 % (ref 2.3–15.9)
RBC.: 2.64 MIL/uL — ABNORMAL LOW (ref 3.87–5.11)
Retic Count, Absolute: 32.2 10*3/uL (ref 19.0–186.0)
Retic Ct Pct: 1.2 % (ref 0.4–3.1)

## 2021-02-16 LAB — ABO/RH: ABO/RH(D): A POS

## 2021-02-16 LAB — BASIC METABOLIC PANEL
Anion gap: 9 (ref 5–15)
BUN: 29 mg/dL — ABNORMAL HIGH (ref 8–23)
CO2: 19 mmol/L — ABNORMAL LOW (ref 22–32)
Calcium: 8.3 mg/dL — ABNORMAL LOW (ref 8.9–10.3)
Chloride: 111 mmol/L (ref 98–111)
Creatinine, Ser: 1.83 mg/dL — ABNORMAL HIGH (ref 0.44–1.00)
GFR, Estimated: 27 mL/min — ABNORMAL LOW (ref >60)
Glucose, Bld: 78 mg/dL (ref 70–99)
Potassium: 4.4 mmol/L (ref 3.5–5.1)
Sodium: 139 mmol/L (ref 135–145)

## 2021-02-16 LAB — ECHOCARDIOGRAM COMPLETE
AR max vel: 1.41 cm2
AV Area VTI: 1.43 cm2
AV Area mean vel: 1.34 cm2
AV Mean grad: 13.3 mmHg
AV Peak grad: 24.6 mmHg
Ao pk vel: 2.48 m/s
Area-P 1/2: 1.99 cm2
Calc EF: 63.6 %
MV M vel: 5.33 m/s
MV Peak grad: 113.6 mmHg
MV VTI: 1.1 cm2
S' Lateral: 2.9 cm
Single Plane A2C EF: 64.1 %
Single Plane A4C EF: 63.1 %

## 2021-02-16 LAB — CBC
HCT: 25.3 % — ABNORMAL LOW (ref 36.0–46.0)
Hemoglobin: 7.7 g/dL — ABNORMAL LOW (ref 12.0–15.0)
MCH: 29.5 pg (ref 26.0–34.0)
MCHC: 30.4 g/dL (ref 30.0–36.0)
MCV: 96.9 fL (ref 80.0–100.0)
Platelets: 216 10*3/uL (ref 150–400)
RBC: 2.61 MIL/uL — ABNORMAL LOW (ref 3.87–5.11)
RDW: 13.6 % (ref 11.5–15.5)
WBC: 7 10*3/uL (ref 4.0–10.5)
nRBC: 0 % (ref 0.0–0.2)

## 2021-02-16 LAB — HEMOGLOBIN AND HEMATOCRIT, BLOOD
HCT: 27.7 % — ABNORMAL LOW (ref 36.0–46.0)
Hemoglobin: 8.5 g/dL — ABNORMAL LOW (ref 12.0–15.0)

## 2021-02-16 LAB — LACTIC ACID, PLASMA: Lactic Acid, Venous: 0.9 mmol/L (ref 0.5–1.9)

## 2021-02-16 MED ORDER — METHYLPREDNISOLONE SODIUM SUCC 125 MG IJ SOLR
60.0000 mg | Freq: Every day | INTRAMUSCULAR | Status: DC
  Administered 2021-02-16 – 2021-02-18 (×3): 60 mg via INTRAVENOUS
  Filled 2021-02-16 (×3): qty 2

## 2021-02-16 MED ORDER — LACTATED RINGERS IV SOLN: INTRAVENOUS | Status: DC

## 2021-02-16 MED ORDER — IPRATROPIUM-ALBUTEROL 0.5-2.5 (3) MG/3ML IN SOLN: 3.0000 mL | Freq: Four times a day (QID) | RESPIRATORY_TRACT | Status: DC | PRN

## 2021-02-16 MED ORDER — FERROUS SULFATE 325 (65 FE) MG PO TABS
325.0000 mg | ORAL_TABLET | Freq: Two times a day (BID) | ORAL | Status: DC
  Administered 2021-02-16 – 2021-02-19 (×6): 325 mg via ORAL
  Filled 2021-02-16 (×6): qty 1

## 2021-02-16 MED ORDER — ADULT MULTIVITAMIN W/MINERALS CH
1.0000 | ORAL_TABLET | Freq: Every day | ORAL | Status: DC
  Administered 2021-02-16 – 2021-02-19 (×3): 1 via ORAL
  Filled 2021-02-16 (×4): qty 1

## 2021-02-16 MED ORDER — BUDESONIDE 0.25 MG/2ML IN SUSP
0.2500 mg | Freq: Two times a day (BID) | RESPIRATORY_TRACT | Status: DC
  Administered 2021-02-16 – 2021-02-19 (×6): 0.25 mg via RESPIRATORY_TRACT
  Filled 2021-02-16 (×6): qty 2

## 2021-02-16 MED ORDER — ENSURE ENLIVE PO LIQD
237.0000 mL | Freq: Two times a day (BID) | ORAL | Status: DC
  Administered 2021-02-16: 237 mL via ORAL

## 2021-02-16 MED ORDER — POLYVINYL ALCOHOL 1.4 % OP SOLN
1.0000 [drp] | OPHTHALMIC | Status: DC | PRN
  Administered 2021-02-16 – 2021-02-19 (×3): 1 [drp] via OPHTHALMIC
  Filled 2021-02-16: qty 15

## 2021-02-16 MED ORDER — ENSURE ENLIVE PO LIQD
237.0000 mL | Freq: Three times a day (TID) | ORAL | Status: DC
  Administered 2021-02-16 – 2021-02-19 (×8): 237 mL via ORAL

## 2021-02-16 NOTE — Assessment & Plan Note (Signed)
Baseline O2 requirement is 3 L/min continuous. Stable.

## 2021-02-16 NOTE — Care Management Obs Status (Signed)
[place] NOTIFICATION   Patient Details  Name: Courtney Cline MRN: [id number] Date of Birth: [birth year]   Medicare Observation Status Notification Given:  Yes    Carles Collet, RN 02/16/2021, 3:43 PM

## 2021-02-16 NOTE — Assessment & Plan Note (Signed)
Appears hypovolemic on admission, AKI. --Lasix on hold --continue metoprolol

## 2021-02-16 NOTE — Plan of Care (Signed)
  Problem: Health Behavior/Discharge Planning: Goal: Ability to manage health-related needs will improve Outcome: Progressing   

## 2021-02-16 NOTE — Progress Notes (Signed)
Initial Nutrition Assessment  DOCUMENTATION CODES:  Non-severe (moderate) malnutrition in context of chronic illness  INTERVENTION:  Obtain updated weight.  Increase Ensure from BID to TID.  Add MVI with minerals daily.  Encourage PO and supplement intake.  NUTRITION DIAGNOSIS:  Moderate Malnutrition related to chronic illness (COPD) as evidenced by mild fat depletion, moderate muscle depletion, severe muscle depletion.  GOAL:  Patient will meet greater than or equal to 90% of their needs  MONITOR:  PO intake, Supplement acceptance, Labs, Weight trends, I & O's  REASON FOR ASSESSMENT:  Consult, Malnutrition Screening Tool Assessment of nutrition requirement/status  ASSESSMENT:  88 yo female with a PMH of COPD on 3L O2, A fib, HTN, and HLD who presents with hemptysis, general malaise, and myalgias. Admitted with AKI.  Spoke with pt and family at bedside. Pt reports that she had four of her teeth pulled on the bottom row and she has been in significant pain for about 7 weeks. She reports that she originally had an appointment to get dentures fitted, but she was in too much pain for the appointment.  She reports mostly eating very soft foods and soups for the past several weeks. Family at bedside reported that pt has eaten very little over the past week due to her cough and trouble breathing.  Pt denies any significant weight change. She reports "maybe a few pounds."   Pt with no admission weight. RD to order. Last weight was 09/01/20 in Epic.  RD to increase Ensure to TID and add MVI with minerals daily.  Supplements: Ensure BID  Medications: reviewed; Vitamin D3, prednisone, Vitamin B12  Labs: reviewed; BUN 29 (H - trending down), Crt 1.83 (H - trending down)  NUTRITION - FOCUSED PHYSICAL EXAM: Flowsheet Row Most Recent Value  Orbital Region Mild depletion  Upper Arm Region No depletion  Thoracic and Lumbar Region No depletion  Buccal Region Mild depletion  Temple  Region No depletion  Clavicle Bone Region No depletion  Clavicle and Acromion Bone Region Moderate depletion  Scapular Bone Region No depletion  Dorsal Hand Severe depletion  Patellar Region No depletion  Anterior Thigh Region No depletion  Posterior Calf Region No depletion  Edema (RD Assessment) None  Hair Reviewed  Eyes Reviewed  Mouth Reviewed  [Missing teeth]  Skin Reviewed  Nails Reviewed   Diet Order:   Diet Order             Diet Heart Room service appropriate? Yes; Fluid consistency: Thin  Diet effective now                  EDUCATION NEEDS:  Education needs have been addressed  Skin:  Skin Assessment: Reviewed RN Assessment  Last BM:  02/13/21  Height:  Ht Readings from Last 1 Encounters:  09/01/20 5' (1.524 m)   Weight:  Wt Readings from Last 1 Encounters:  09/01/20 76.2 kg   BMI:  There is no height or weight on file to calculate BMI.  Estimated Nutritional Needs:  Kcal:  1700-1900 Protein:  85-100 grams Fluid:  >1.7 L  Derrel Nip, RD, LDN (she/her/hers) Clinical Inpatient Dietitian RD Pager/After-Hours/Weekend Pager # in [place]

## 2021-02-16 NOTE — Assessment & Plan Note (Signed)
Related to chronic illness (COPD) as evidenced by mild fat depletion, moderate muscle depletion, severe muscle depletion --Ensure supplements, MVI ordered. --Appreciate dietitian recommendations.

## 2021-02-16 NOTE — Assessment & Plan Note (Signed)
Iron deficient.  Pt reports taking iron supplement and hx of getting iron infusions.  Folate normal, B12 elevated. Hbg stable 9.3>>7.7>>8.5 this afternoon. --Monitor CBC/Hbg --Resume PO iron BID WC

## 2021-02-16 NOTE — Assessment & Plan Note (Signed)
Continue Crestor 

## 2021-02-16 NOTE — Discharge Instructions (Signed)
Nutrition Post Hospital Stay Proper nutrition can help your body recover from illness and injury.   Foods and beverages high in protein, vitamins, and minerals help rebuild muscle loss, promote healing, & reduce fall risk.   .In addition to eating healthy foods, a nutrition shake is an easy, delicious way to get the nutrition you need during and after your hospital stay  It is recommended that you continue to drink 2 bottles per day of:       Ensure for at least 1 month (30 days) after your hospital stay   Tips for adding a nutrition shake into your routine: As allowed, drink one with vitamins or medications instead of water or juice Enjoy one as a tasty mid-morning or afternoon snack Drink cold or make a milkshake out of it Drink one instead of milk with cereal or snacks Use as a coffee creamer   Available at the following grocery stores and pharmacies:           * [employer] * [employer] * [employer]co  * Rite Aid          * Walmart * [employer]  * Walgreens      * Target  * BJ's   * CVS  * Low[employer] Foods   * [facility] [phone]            For COUPONS visit: www.ensure.com/join or www.[email or web address]/members/sign-[email or web address]   Suggested Substitutions Ensure Plus = Boost Plus = Carnation Breakfast Essentials = Boost Compact Ensure Active Clear = Boost Breeze Glucerna Shake = Boost Glucose Control = Carnation Breakfast Essentials SUGAR FREE       

## 2021-02-16 NOTE — Assessment & Plan Note (Signed)
Pt has SOB, cough and very poor air movement on exam.   --Continue IV steroids another 24-48 hours then prednisone to complete 5 days vs longer taper depending on clinical course --Oxygen per protocol --Zithromax --Nebs, antitussives, pulmonary hygiene

## 2021-02-16 NOTE — Evaluation (Signed)
Physical Therapy Evaluation Patient Details Name: Courtney Cline MRN: [id number] DOB: [birth year] Today's Date: 02/16/2021  History of Present Illness  Pt is a 88 y/o F presenting to ED on 1/2 for hemptysis, general malaise,and myalgias. PMH includes COPD on 3L O2 via [place], A fib, HTN, and HLD.  Clinical Impression  Received pt semi-reclined in bed. Pt reported mild SOB during session but O2 sat remained >92% on 3L. Pt performed bed mobility with supervision using bed features and transferred into recliner using RW and min guard. Pt left with all needs within reach. Pt will benefit from continued PT services to address strength, balance, and endurance deficits. Acute PT to cont to follow.      Recommendations for follow up therapy are one component of a multi-disciplinary discharge planning process, led by the attending physician.  Recommendations may be updated based on patient status, additional functional criteria and insurance authorization.  Follow Up Recommendations Home health PT    Assistance Recommended at Discharge Intermittent Supervision/Assistance  Patient can return home with the following  A little help with walking and/or transfers    Equipment Recommendations Rolling walker (2 wheels)  Recommendations for Other Services       Functional Status Assessment Patient has had a recent decline in their functional status and demonstrates the ability to make significant improvements in function in a reasonable and predictable amount of time.     Precautions / Restrictions Precautions Precautions: Fall Precaution Comments: 3L O2 Restrictions Weight Bearing Restrictions: No      Mobility  Bed Mobility Overal bed mobility: Needs Assistance Bed Mobility: Rolling;Supine to Sit Rolling: Supervision   Supine to sit: Supervision Sit to supine: Supervision   General bed mobility comments: HOB elevated and use of bedrails Patient Response:  Cooperative  Transfers Overall transfer level: Needs assistance Equipment used: Rolling walker (2 wheels) Transfers: Sit to/from Stand;Bed to chair/wheelchair/BSC Sit to Stand: Min guard Stand pivot transfers: Min guard         General transfer comment: pt transferred from bed<>recliner with RW. O2 sat >92%    Ambulation/Gait                  Stairs            Wheelchair Mobility    Modified Rankin (Stroke Patients Only)       Balance Overall balance assessment: Needs assistance Sitting-balance support: Feet supported;Bilateral upper extremity supported Sitting balance-Leahy Scale: Fair     Standing balance support: Bilateral upper extremity supported;During functional activity (RW) Standing balance-Leahy Scale: Poor                               Pertinent Vitals/Pain Pain Assessment: No/denies pain    Home Living Family/patient expects to be discharged to:: Private residence Living Arrangements: Spouse/significant other Available Help at Discharge: Family;Available PRN/intermittently (has 2 sons who can provide intermittent assist.) Type of Home: House Home Access: Stairs to enter Entrance Stairs-Rails: Can reach both Entrance Stairs-Number of Steps: 3   Home Layout: One level Home Equipment: Conservation officer, nature (2 wheels);Toilet riser;Shower seat;Wheelchair - manual Additional Comments: on 3L O2 at home    Prior Function Prior Level of Function : Independent/Modified Independent             Mobility Comments: reports not using AD prior to admission ADLs Comments: does IADLs     Hand Dominance   Dominant Hand: Right    Extremity/Trunk  Assessment   Upper Extremity Assessment Upper Extremity Assessment: Generalized weakness    Lower Extremity Assessment Lower Extremity Assessment: Generalized weakness    Cervical / Trunk Assessment Cervical / Trunk Assessment: Kyphotic  Communication   Communication: No difficulties   Cognition Arousal/Alertness: Awake/alert Behavior During Therapy: WFL for tasks assessed/performed Overall Cognitive Status: Within Functional Limits for tasks assessed                                          General Comments General comments (skin integrity, edema, etc.): Pt on 3LO2 via [place] throughout session, felt very SOB sitting EOB, used rescue inhaler x2 while sitting EOB. SpO2 >90% throughout session. Son present at end of session    Exercises     Assessment/Plan    PT Assessment Patient needs continued PT services  PT Problem List Decreased strength;Decreased activity tolerance;Decreased balance;Decreased mobility;Decreased coordination       PT Treatment Interventions DME instruction;Gait training;Stair training;Functional mobility training;Therapeutic activities;Therapeutic exercise;Balance training;Neuromuscular re-education;Patient/family education;Wheelchair mobility training;Modalities    PT Goals (Current goals can be found in the Care Plan section)  Acute Rehab PT Goals Patient Stated Goal: did not state PT Goal Formulation: With patient Time For Goal Achievement: 02/23/21 Potential to Achieve Goals: Good    Frequency Min 3X/week     Co-evaluation               AM-PAC PT "6 Clicks" Mobility  Outcome Measure Help needed turning from your back to your side while in a flat bed without using bedrails?: A Little Help needed moving from lying on your back to sitting on the side of a flat bed without using bedrails?: A Little Help needed moving to and from a bed to a chair (including a wheelchair)?: A Little Help needed standing up from a chair using your arms (e.g., wheelchair or bedside chair)?: A Little Help needed to walk in hospital room?: A Little Help needed climbing 3-5 steps with a railing? : A Little 6 Click Score: 18    End of Session Equipment Utilized During Treatment: Oxygen Activity Tolerance: Patient tolerated treatment  well Patient left: in chair;with call bell/phone within reach;with chair alarm set;with nursing/sitter in room Nurse Communication: Mobility status PT Visit Diagnosis: Unsteadiness on feet (R26.81);Other abnormalities of gait and mobility (R26.89);Muscle weakness (generalized) (M62.81);Other (comment) (SOB)    Time: 3491-7915 PT Time Calculation (min) (ACUTE ONLY): 20 min   Charges:   PT Evaluation $PT Eval Low Complexity: 1 Low          Becky Sax PT, DPT  Blenda Nicely 02/16/2021, 11:56 AM

## 2021-02-16 NOTE — Assessment & Plan Note (Signed)
Hbg is stable.  Likely due to airway irritation related to coughing fits.  Small volume but per pt is ongoing. Monitor Hbg.  Treat COPD and cough.

## 2021-02-16 NOTE — Assessment & Plan Note (Signed)
BP stable to soft.  On IV fluids. --Lasix on hold --On metoprolol

## 2021-02-16 NOTE — Assessment & Plan Note (Signed)
POA, due to poor PO intake, likely pre-renal etiology.  Renal function improving with IV hydration.  Renal U/S negative for signs of obstruction, no hydronephrosis.   Cr 2.10 >> 1.83 today.  Baseline Cr ~1.2 last Dec. --Hold Lasix and lisinopril --Continue further IV hydration today --Follow BMP

## 2021-02-16 NOTE — TOC Initial Note (Addendum)
Transition of Care [facility]) - Initial/Assessment Note    Patient Details  Name: Courtney Cline MRN: [id number] Date of Birth: 21-Dec-1933  Transition of [facility]) CM/SW Contact:    Carles Collet, RN Phone Number: 02/16/2021, 2:06 PM  Clinical Narrative:       Spoke w patient and spouse at son at bedside. They would like [facility] since granddaughter works for them [facility] states they may have an 80% copay, liaison is checking with specific plan and will get back to me. Patient has all needed DME at home. TOC will continue to follow for [facility] needs            15:45 [facility] is waiting to hear back from intake what copay will be. If high, patient's second choice is [place]   Expected Discharge Plan: [place] Barriers to Discharge: Continued Medical Work up   Patient Goals and CMS Choice Patient states their goals for this hospitalization and ongoing recovery are:: to go home CMS Medicare.gov Compare Post Acute Care list provided to:: Other (Comment Required) Choice offered to / list presented to : Adult Children  Expected Discharge Plan and Services Expected Discharge Plan: [place]   Discharge Planning Services: CM Consult                     DME Arranged: [name]         HH Arranged: PT, OT          Prior Living Arrangements/Services                       Activities of Daily Living Home Assistive Devices/Equipment: Walker (specify type), Bedside commode/3-in-1, Blood pressure cuff, Oxygen ADL Screening (condition at time of admission) Patient's cognitive ability adequate to safely complete daily activities?: Yes Is the patient deaf or have difficulty hearing?: No Does the patient have difficulty seeing, even when wearing glasses/contacts?: No Does the patient have difficulty concentrating, remembering, or making decisions?: No Patient able to express need for assistance with ADLs?: Yes Does the patient have difficulty  dressing or bathing?: Yes Independently performs ADLs?: Yes (appropriate for developmental age) Does the patient have difficulty walking or climbing stairs?: Yes Weakness of Legs: None (SOB) Weakness of Arms/Hands: None  Permission Sought/Granted                  Emotional Assessment              Admission diagnosis:  AKI (acute kidney injury) ([place]) [N17.9] Patient Active Problem List   Diagnosis Date Noted   AKI (acute kidney injury) ([place]) 02/15/2021   CAP (community acquired pneumonia) 01/21/2020   Nausea 01/21/2020   Pneumonia 01/21/2020   Acute cystitis without hematuria    Persistent atrial fibrillation ([place]) 08/20/2019   COPD without exacerbation ([place]) 10/15/2018   Healthcare maintenance 10/15/2018   Physical deconditioning 10/15/2018   Sinusitis 44/96/7591   Diastolic CHF ([place]) 63/84/6659   Vitamin D deficiency    Allergic rhinitis 11/23/2015   Osteopenia with high risk of fracture 11/05/2015   Post-nasal drip 03/16/2015   Anemia of chronic disease 03/10/2015   Subclavian artery stenosis, left ([place]) 04/24/2014   Bilateral lower extremity edema 04/24/2014   Essential hypertension 04/24/2014   Hyperlipidemia 04/24/2014   Breast cancer of upper-inner quadrant of right female breast ([place]) 11/19/2013   Chronic hypoxemic respiratory failure ([place]) 10/02/2013   PCP:  Leeroy Cha, MD Pharmacy:  [facility], [address] [place] [address] Phone: [phone] Fax: [phone]  [facility] - [place], [place] - [address] [place] [address] Phone: [phone] Fax: [phone]     Social Determinants of Health (SDOH) Interventions    Readmission Risk Interventions No flowsheet data found.

## 2021-02-16 NOTE — Hospital Course (Signed)
Courtney Cline is a 88 y.o. female with medical history significant of chronic hypoxic respite failure secondary to COPD on 3 L oxygen 24/7, chronic A. fib on Eliquis, chronic macrocytic anemia, chronic diastolic CHF, CKD stage II, moderate pulmonary hypertension, presented to the ED on 02/15/21 with cough, shortness of breath, hemoptysis and poor PO intake x 4 days.  Admitted for management of COPD with acute exacerbation, monitoring of hemoptysis and IV hydration for AKI.

## 2021-02-16 NOTE — Evaluation (Signed)
Occupational Therapy Evaluation Patient Details Name: Yamaira Spinner MRN: [id number] DOB: 1934/01/26 Today's Date: 02/16/2021   History of Present Illness Pt is a 88 y/o F presenting to ED on 1/2 for hemptysis, general malaise,and myalgias. PMH includes COPD on 3L O2 via [place], A fib, HTN, and HLD.   Clinical Impression   Pt reports independence at baseline with ADLs and functional mobility, sponge bathes at baseline and has DME including RW, shower seat, and toilet riser. Currently, Pt min A for ADLs, supervision for bed mobility, and min guard for transfers. Pt limited by SOB this session only able to take a few side steps, used rescue inhaler x2 while sitting EOB, SpO2 remained above 90% throughout session. Reviewed energy conservation strategies with pt including sponge bathing vs. showering, pt verbalized understanding. Pt presenting with impairments listed below, will continue to follow acutely. Recommend d/c home with [place].     Recommendations for follow up therapy are one component of a multi-disciplinary discharge planning process, led by the attending physician.  Recommendations may be updated based on patient status, additional functional criteria and insurance authorization.   Follow Up Recommendations  Home health OT    Assistance Recommended at Discharge Set up Supervision/Assistance  Patient can return home with the following A little help with walking and/or transfers;A little help with bathing/dressing/bathroom    Functional Status Assessment  Patient has had a recent decline in their functional status and demonstrates the ability to make significant improvements in function in a reasonable and predictable amount of time.  Equipment Recommendations  None recommended by OT;Other (comment) (pt has all needed DME)    Recommendations for Other Services PT consult     Precautions / Restrictions Precautions Precautions: Fall Restrictions Weight Bearing Restrictions: No       Mobility Bed Mobility Overal bed mobility: Needs Assistance Bed Mobility: Supine to Sit;Sit to Supine     Supine to sit: Supervision Sit to supine: Supervision   General bed mobility comments: increased time, benefits from use of bedrails    Transfers Overall transfer level: Needs assistance Equipment used: None Transfers: Sit to/from Stand Sit to Stand: Min guard           General transfer comment: pt feeling very SOB upon sitting EOB, able to perform side stepping towards [facility]      Balance Overall balance assessment: Needs assistance Sitting-balance support: Feet supported;No upper extremity supported Sitting balance-Leahy Scale: Fair     Standing balance support: During functional activity;No upper extremity supported Standing balance-Leahy Scale: Fair                             ADL either performed or assessed with clinical judgement   ADL Overall ADL's : Needs assistance/impaired Eating/Feeding: Independent;Sitting   Grooming: Independent;Sitting   Upper Body Bathing: Minimal assistance;Sitting   Lower Body Bathing: Minimal assistance;Sitting/lateral leans   Upper Body Dressing : Set up;Sitting   Lower Body Dressing: Minimal assistance;Sit to/from stand Lower Body Dressing Details (indicate cue type and reason): dons socks sitting EOB Toilet Transfer: Min guard;Minimal assistance;Rolling walker (2 wheels);Stand-pivot   Toileting- Water quality scientist and Hygiene: Supervision/safety;Sit to/from stand       Functional mobility during ADLs: Min guard;Rolling walker (2 wheels)       Vision   Vision Assessment?: No apparent visual deficits     Perception     Praxis      Pertinent Vitals/Pain Pain Assessment: No/denies pain  Hand Dominance     Extremity/Trunk Assessment Upper Extremity Assessment Upper Extremity Assessment: Generalized weakness   Lower Extremity Assessment Lower Extremity Assessment: Defer to PT  evaluation   Cervical / Trunk Assessment Cervical / Trunk Assessment: Kyphotic   Communication Communication Communication: No difficulties   Cognition Arousal/Alertness: Awake/alert                                           General Comments  Pt on 3LO2 via  throughout session, felt very SOB sitting EOB, used rescue inhaler x2 while sitting EOB. SpO2 >90% throughout session. Son present at end of session    Exercises     Shoulder Instructions      Home Living Family/patient expects to be discharged to:: Private residence Living Arrangements: Spouse/significant other Available Help at Discharge: Family;Other (Comment) (has 2 sons that do not live with her, but are able to help, Son that is present during session states he is able to stop by 1x/day) Type of Home: House Home Access: Stairs to enter [employer] of Steps: 3 Entrance Stairs-Rails: Can reach both Home Layout: One level     Bathroom Shower/Tub: Tub/shower unit;Curtain   Bathroom Toilet: Handicapped height     Home Equipment: Conservation officer, nature (2 wheels);Toilet riser;Shower seat   Additional Comments: on 3L O2 at home      Prior Functioning/Environment Prior Level of Function : Independent/Modified Independent               ADLs Comments: does IADLs        OT Problem List: Decreased activity tolerance;Impaired balance (sitting and/or standing);Decreased safety awareness;Decreased knowledge of use of DME or AE;Cardiopulmonary status limiting activity      OT Treatment/Interventions: Self-care/ADL training;Therapeutic exercise;Therapeutic activities;Visual/perceptual remediation/compensation;Balance training;Patient/family education;Energy conservation    OT Goals(Current goals can be found in the care plan section) Acute Rehab OT Goals Patient Stated Goal: return PLOF OT Goal Formulation: With patient Time For Goal Achievement: 03/02/21 Potential to Achieve Goals:  Good ADL Goals Pt Will Perform Upper Body Dressing: with modified independence;sitting Pt Will Perform Lower Body Dressing: with modified independence;sit to/from stand Pt Will Transfer to Toilet: with supervision;ambulating;regular height toilet  OT Frequency: Min 2X/week    Co-evaluation              AM-PAC OT "6 Clicks" Daily Activity     Outcome Measure Help from another person eating meals?: None Help from another person taking care of personal grooming?: A Little Help from another person toileting, which includes using toliet, bedpan, or urinal?: A Little Help from another person bathing (including washing, rinsing, drying)?: A Little Help from another person to put on and taking off regular upper body clothing?: A Little Help from another person to put on and taking off regular lower body clothing?: A Little 6 Click Score: 19   End of Session Equipment Utilized During Treatment: Oxygen Nurse Communication: Mobility status;Other (comment) (pt's use of rescue inhaler)  Activity Tolerance: Patient limited by fatigue Patient left: in bed;with call bell/phone within reach;with bed alarm set;with family/visitor present  OT Visit Diagnosis: Unsteadiness on feet (R26.81);Other abnormalities of gait and mobility (R26.89);Muscle weakness (generalized) (M62.81)                Time: 1540-0867 OT Time Calculation (min): 28 min Charges:  OT General Charges $OT Visit: 1 Visit OT Evaluation $OT Eval Low  Complexity: 1 Low OT Treatments $Self Care/Home Management : 8-22 mins  Lynnda Child, OTD, OTR/L Acute Rehab [phone] - [place] 02/16/2021, 8:53 AM

## 2021-02-16 NOTE — Progress Notes (Signed)
Progress Note   Patient: Courtney Cline ZOX:[id number] DOB: [birth year] DOA: 02/15/2021     0 DOS: the patient was seen and examined on 02/16/2021   Brief hospital course: Courtney Cline is a 88 y.o. female with medical history significant of chronic hypoxic respite failure secondary to COPD on 3 L oxygen 24/7, chronic A. fib on Eliquis, chronic macrocytic anemia, chronic diastolic CHF, CKD stage II, moderate pulmonary hypertension, presented to the ED on 02/15/21 with cough, shortness of breath, hemoptysis and poor PO intake x 4 days.  Admitted for management of COPD with acute exacerbation, monitoring of hemoptysis and IV hydration for AKI.   Assessment and Plan * COPD with acute exacerbation (HCC)- (present on admission) Pt has SOB, cough and very poor air movement on exam.   --Continue IV steroids another 24-48 hours then prednisone to complete 5 days vs longer taper depending on clinical course --Oxygen per protocol --Zithromax --Nebs, antitussives, pulmonary hygiene  AKI (acute kidney injury) (HCC)- (present on admission) POA, due to poor PO intake, likely pre-renal etiology.  Renal function improving with IV hydration.  Renal U/S negative for signs of obstruction, no hydronephrosis.   Cr 2.10 >> 1.83 today.  Baseline Cr ~1.2 last Dec. --Hold Lasix and lisinopril --Continue further IV hydration today --Follow BMP    Cough with hemoptysis- (present on admission) Hbg is stable.  Likely due to airway irritation related to coughing fits.  Small volume but per pt is ongoing. Monitor Hbg.  Treat COPD and cough.  Chronic hypoxemic respiratory failure (HCC)- (present on admission) Baseline O2 requirement is 3 L/min continuous. Stable.  Diastolic CHF (HCC)- (present on admission) Appears hypovolemic on admission, AKI. --Lasix on hold --continue metoprolol  Anemia of chronic disease- (present on admission) Iron deficient.  Pt reports taking iron supplement and hx of  getting iron infusions.  Folate normal, B12 elevated. Hbg stable 9.3>>7.7>>8.5 this afternoon. --Monitor CBC/Hbg --Resume PO iron BID WC  Essential hypertension- (present on admission) BP stable to soft.  On IV fluids. --Lasix on hold --On metoprolol  Hyperlipidemia- (present on admission) Continue Crestor  Malnutrition of moderate degree- (present on admission) Related to chronic illness (COPD) as evidenced by mild fat depletion, moderate muscle depletion, severe muscle depletion --Ensure supplements, MVI ordered. --Appreciate dietitian recommendations.     Subjective: Pt awake resting in bed with son at bedside this AM.  She has ongoing coughing fits with some small amounts of hemoptysis continuing today.  No fever but some subjective chills at times.  Admits to poor PO intake at home recently.  Reports taking iron pills at home.  Objective Vitals notable for O2 sats upper 90's on baselinen 3 L/min [place] O2, afebrile, HR mildly tachycardic, BP's stable for soft diastolics.   General exam: awake, alert, no acute distress HEENT: moist mucus membranes, hearing grossly normal  Respiratory system: frequent coughing, very poor air movement, no wheezes able to be heard, no rhonchi, mildly respiratory effort due to coughing. Cardiovascular system: normal S1/S2, RRR, no pedal edema.   Gastrointestinal system: soft, NT, ND Central nervous system: A&O x3. no gross focal neurologic deficits, normal speech Extremities: moves all, no edema, normal tone Skin: dry, intact, normal temperature Psychiatry: normal mood, congruent affect, judgement and insight appear normal   Data Reviewed: Labs notable for Cr 2.10>>1.83, iron 10, TIBC 224, sat ratio 4, normal folate, elevated vit B12, Hbg 9.3>>7.7>>8. 5  Echo today EF 60-65%, indeterminate diastolic parameters  Family Communication: son at bedside on rounds  Disposition: Status is: Inpatient  Remains inpatient appropriate because: continues  on IV fluids for AKI and IV steroids for COPD exacerbation         Time spent: 35 minutes  Author: Pennie Banter 02/16/2021 6:02 PM  For on call review [email or web address].

## 2021-02-17 LAB — CBC
HCT: 25.5 % — ABNORMAL LOW (ref 36.0–46.0)
Hemoglobin: 7.9 g/dL — ABNORMAL LOW (ref 12.0–15.0)
MCH: 29.5 pg (ref 26.0–34.0)
MCHC: 31 g/dL (ref 30.0–36.0)
MCV: 95.1 fL (ref 80.0–100.0)
Platelets: 226 10*3/uL (ref 150–400)
RBC: 2.68 MIL/uL — ABNORMAL LOW (ref 3.87–5.11)
RDW: 13.4 % (ref 11.5–15.5)
WBC: 5.8 10*3/uL (ref 4.0–10.5)
nRBC: 0 % (ref 0.0–0.2)

## 2021-02-17 LAB — BASIC METABOLIC PANEL
Anion gap: 5 (ref 5–15)
BUN: 32 mg/dL — ABNORMAL HIGH (ref 8–23)
CO2: 21 mmol/L — ABNORMAL LOW (ref 22–32)
Calcium: 8.8 mg/dL — ABNORMAL LOW (ref 8.9–10.3)
Chloride: 112 mmol/L — ABNORMAL HIGH (ref 98–111)
Creatinine, Ser: 1.68 mg/dL — ABNORMAL HIGH (ref 0.44–1.00)
GFR, Estimated: 30 mL/min — ABNORMAL LOW (ref >60)
Glucose, Bld: 156 mg/dL — ABNORMAL HIGH (ref 70–99)
Potassium: 4.7 mmol/L (ref 3.5–5.1)
Sodium: 138 mmol/L (ref 135–145)

## 2021-02-17 MED ORDER — KETOTIFEN FUMARATE 0.025 % OP SOLN
1.0000 [drp] | Freq: Two times a day (BID) | OPHTHALMIC | Status: DC
  Administered 2021-02-17 – 2021-02-19 (×5): 1 [drp] via OPHTHALMIC
  Filled 2021-02-17: qty 5

## 2021-02-17 NOTE — Progress Notes (Signed)
Occupational Therapy Treatment Patient Details Name: Manna Gose MRN: [id number] DOB: 08-09-1933 Today's Date: 02/17/2021   History of present illness Pt is a 88 y/o F presenting to ED on 1/2 for hemptysis, general malaise,and myalgias. PMH includes COPD on 3L O2 via [place], A fib, HTN, and HLD.   OT comments  Pt progressing towards goals this session, currently min guard for ADLs and transfers, supervision for bed mobility. Pt able to walk short household distance and perform simulated toilet transfer in room with RW.  Pt SpO2 88% on 3L O2 after transfer, but quickly increased to ~95% after getting back in bed. Pt continues to be limited by activity tolerance at this time. Will continue to follow acutely. Recommend d/c home with [place].   Recommendations for follow up therapy are one component of a multi-disciplinary discharge planning process, led by the attending physician.  Recommendations may be updated based on patient status, additional functional criteria and insurance authorization.    Follow Up Recommendations  Home health OT    Assistance Recommended at Discharge Set up Supervision/Assistance  Patient can return home with the following  A little help with walking and/or transfers;A little help with bathing/dressing/bathroom   Equipment Recommendations  None recommended by OT;Other (comment) (pt has all needed DME)    Recommendations for Other Services PT consult    Precautions / Restrictions Precautions Precautions: Fall Precaution Comments: watch O2 Restrictions Weight Bearing Restrictions: No       Mobility Bed Mobility Overal bed mobility: Needs Assistance Bed Mobility: Rolling;Supine to Sit Rolling: Supervision   Supine to sit: Supervision Sit to supine: Supervision   General bed mobility comments: HOB elevated and use of bedrails    Transfers Overall transfer level: Needs assistance Equipment used: Rolling walker (2 wheels) Transfers: Sit to/from  Stand;Bed to chair/wheelchair/BSC Sit to Stand: Min guard   Step pivot transfers: Min guard       General transfer comment: Pt able to complete sit to stand transfer x2 with short distance ambulation     Balance Overall balance assessment: Needs assistance Sitting-balance support: Feet supported;Bilateral upper extremity supported Sitting balance-Leahy Scale: Fair     Standing balance support: Bilateral upper extremity supported;During functional activity;Reliant on assistive device for balance Standing balance-Leahy Scale: Poor Standing balance comment: fair static standing balance                           ADL either performed or assessed with clinical judgement   ADL Overall ADL's : Needs assistance/impaired                         Toilet Transfer: Min guard;Rolling walker (2 wheels);Ambulation;Comfort height toilet Toilet Transfer Details (indicate cue type and reason): simulated in room         Functional mobility during ADLs: Min guard      Extremity/Trunk Assessment Upper Extremity Assessment Upper Extremity Assessment: Generalized weakness   Lower Extremity Assessment Lower Extremity Assessment: Defer to PT evaluation        Vision   Vision Assessment?: No apparent visual deficits   Perception Perception Perception: Not tested   Praxis Praxis Praxis: Not tested    Cognition Arousal/Alertness: Awake/alert Behavior During Therapy: WFL for tasks assessed/performed Overall Cognitive Status: Within Functional Limits for tasks assessed  Exercises     Shoulder Instructions       General Comments Pt remained on 3L O2 via [place] thorughout session, able to walk short distance in room, SpO2 88%, quickly recovered to 95% once back in bed.    Pertinent Vitals/ Pain       Pain Assessment: No/denies pain  Home Living                                           Prior Functioning/Environment              Frequency  Min 2X/week        Progress Toward Goals  OT Goals(current goals can now be found in the care plan section)  Progress towards OT goals: Progressing toward goals  Acute Rehab OT Goals Patient Stated Goal: return to PLOF OT Goal Formulation: With patient Time For Goal Achievement: 03/02/21 Potential to Achieve Goals: Good ADL Goals Pt Will Perform Upper Body Dressing: with modified independence;sitting Pt Will Perform Lower Body Dressing: with modified independence;sit to/from stand Pt Will Transfer to Toilet: with supervision;ambulating;regular height toilet  Plan Discharge plan remains appropriate;Frequency remains appropriate    Co-evaluation                 AM-PAC OT "6 Clicks" Daily Activity     Outcome Measure   Help from another person eating meals?: None Help from another person taking care of personal grooming?: None Help from another person toileting, which includes using toliet, bedpan, or urinal?: A Little Help from another person bathing (including washing, rinsing, drying)?: A Lot Help from another person to put on and taking off regular upper body clothing?: A Little Help from another person to put on and taking off regular lower body clothing?: A Little 6 Click Score: 19    End of Session Equipment Utilized During Treatment: Oxygen;Gait belt;Rolling walker (2 wheels)  OT Visit Diagnosis: Unsteadiness on feet (R26.81);Other abnormalities of gait and mobility (R26.89);Muscle weakness (generalized) (M62.81)   Activity Tolerance Patient limited by fatigue   Patient Left in bed;with call bell/phone within reach;with bed alarm set;with nursing/sitter in room   Nurse Communication Mobility status        Time: 1335-1350 OT Time Calculation (min): 15 min  Charges: OT General Charges $OT Visit: 1 Visit OT Evaluation $OT Eval Low Complexity: 1 Low OT Treatments $Self Care/Home Management  : 8-22 mins  Lynnda Child, OTD, OTR/L Acute Rehab [phone] - [place] 02/17/2021, 2:03 PM

## 2021-02-17 NOTE — Progress Notes (Signed)
PROGRESS NOTE    Courtney Cline  OHY:[id number] DOB: 02-01-1934 DOA: 02/15/2021 PCP: Leeroy Cha, MD   Brief Narrative:  Courtney Cline is a 88 y.o. female with medical history significant of chronic hypoxic respite failure secondary to COPD on 3 L oxygen 24/7, chronic A. fib on Eliquis, chronic macrocytic anemia, chronic diastolic CHF, CKD stage II, moderate pulmonary hypertension, presented to the ED on 02/15/21 with cough, shortness of breath, hemoptysis and poor PO intake x 4 days.   Admitted for management of COPD with acute exacerbation, monitoring of hemoptysis and IV hydration for AKI.  Assessment & Plan:   Principal Problem:   COPD with acute exacerbation ([place]) Active Problems:   Chronic hypoxemic respiratory failure (HCC)   Essential hypertension   Hyperlipidemia   Anemia of chronic disease   Diastolic CHF (HCC)   AKI (acute kidney injury) (HCC)   Malnutrition of moderate degree   Cough with hemoptysis  Chronic hypoxic respiratory failure/COPD with acute exacerbation ([place])- (present on admission): She appears very comfortable and is on 3 L of oxygen which is her baseline however she still complains that her breathing is not at baseline.  On exam to, she has decreased breath sounds but no wheezes at all.  I will continue current management with Solu-Medrol, bronchodilators.   AKI on CKD stage IIIb: Please note that patient does not have AKI.  She has CKD stage IIIb and now she has AKI on top of CKD stage IIIb.  Her baseline creatinine is 1.3, she presented with 2.1 and now it is improved to 1.7.  We will continue IV fluids.  Repeat labs in the morning.   Diastolic CHF ([place])- (present on admission) Appears hypovolemic on admission, AKI. --Lasix on hold --continue metoprolol   Anemia of chronic disease- (present on admission) Iron deficient.  Pt reports taking iron supplement and hx of getting iron infusions.  Folate normal, B12 elevated. Hbg  stable.   Essential hypertension- (present on admission) BP stable to soft.  On IV fluids. --Lasix on hold --On metoprolol   Hyperlipidemia- (present on admission) Continue Crestor   Malnutrition of moderate degree- (present on admission) Related to chronic illness (COPD) as evidenced by mild fat depletion, moderate muscle depletion, severe muscle depletion --Ensure supplements, MVI ordered. --Appreciate dietitian recommendations.   DVT prophylaxis: SCDs Start: 02/15/21 1838   Code Status: Full Code  Family Communication:  None present at bedside.  Plan of care discussed with patient in length and he verbalized understanding and agreed with it.  Status is: Inpatient  Remains inpatient appropriate because: Needs IV medications  Estimated body mass index is 32.33 kg/m as calculated from the following:   Height as of this encounter: 5' (1.524 m).   Weight as of this encounter: 75.1 kg.     Nutritional Assessment: Body mass index is 32.33 kg/m.Marland Kitchen Seen by dietician.  I agree with the assessment and plan as outlined below: Nutrition Status: Nutrition Problem: Moderate Malnutrition Etiology: chronic illness (COPD) Signs/Symptoms: mild fat depletion, moderate muscle depletion, severe muscle depletion Interventions: Ensure Enlive (each supplement provides 350kcal and 20 grams of protein), MVI  .  Skin Assessment: I have examined the patient's skin and I agree with the wound assessment as performed by the wound care RN as outlined below:    Consultants:  None  Procedures:  None  Antimicrobials:  Anti-infectives (From admission, onward)    Start     Dose/Rate Route Frequency Ordered Stop   02/16/21 1845  azithromycin (  ZITHROMAX) tablet 500 mg       See Hyperspace for full Linked Orders Report.   500 mg Oral Daily 02/15/21 1840 02/20/21 0959   02/15/21 1845  azithromycin (ZITHROMAX) 500 mg in sodium chloride 0.9 % 250 mL IVPB       See Hyperspace for full Linked Orders  Report.   500 mg 250 mL/hr over 60 Minutes Intravenous Every 24 hours 02/15/21 1840 02/15/21 2108          Subjective: Seen and examined.  She states that she is still  Objective: Vitals:   02/16/21 2106 02/17/21 0456 02/17/21 0825 02/17/21 0943  BP:  (!) 128/55 (!) 143/52   Pulse:  76 76   Resp:  17 15   Temp:  97.7 F (36.5 C) 97.6 F (36.4 C)   TempSrc:  Oral Oral   SpO2: 97% 98% 99% 99%  Weight:      Height:        Intake/Output Summary (Last 24 hours) at 02/17/2021 1331 Last data filed at 02/17/2021 1142 Gross per 24 hour  Intake 1635.8 ml  Output 460 ml  Net 1175.8 ml   Filed Weights   02/16/21 1500  Weight: 75.1 kg    Examination:  General exam: Appears calm and comfortable  Respiratory system: Slightly diminished breath sounds but no wheezes. Respiratory effort normal. Cardiovascular system: S1 & S2 heard, RRR. No JVD, murmurs, rubs, gallops or clicks. No pedal edema. Gastrointestinal system: Abdomen is nondistended, soft and nontender. No organomegaly or masses felt. Normal bowel sounds heard. Central nervous system: Alert and oriented. No focal neurological deficits. Extremities: Symmetric 5 x 5 power. Skin: No rashes, lesions or ulcers Psychiatry: Judgement and insight appear normal. Mood & affect appropriate.    Data Reviewed: I have personally reviewed following labs and imaging studies  CBC: Recent Labs  Lab 02/15/21 1339 02/16/21 0339 02/16/21 1344 02/17/21 0212  WBC 8.3 7.0  --  5.8  HGB 9.3* 7.7* 8.5* 7.9*  HCT 32.0* 25.3* 27.7* 25.5*  MCV 101.3* 96.9  --  95.1  PLT 224 216  --  371   Basic Metabolic Panel: Recent Labs  Lab 02/15/21 1339 02/16/21 0339 02/17/21 0212  NA 139 139 138  K 4.5 4.4 4.7  CL 109 111 112*  CO2 18* 19* 21*  GLUCOSE 101* 78 156*  BUN 34* 29* 32*  CREATININE 2.10* 1.83* 1.68*  CALCIUM 9.1 8.3* 8.8*   GFR: Estimated Creatinine Clearance: 22.1 mL/min (A) (by C-G formula based on SCr of 1.68 mg/dL  (H)). Liver Function Tests: Recent Labs  Lab 02/15/21 1339  AST 12*  ALT 8  ALKPHOS 87  BILITOT 0.9  PROT 7.0  ALBUMIN 3.1*   No results for input(s): LIPASE, AMYLASE in the last 168 hours. No results for input(s): AMMONIA in the last 168 hours. Coagulation Profile: Recent Labs  Lab 02/15/21 1339  INR 1.2   Cardiac Enzymes: No results for input(s): CKTOTAL, CKMB, CKMBINDEX, TROPONINI in the last 168 hours. BNP (last 3 results) No results for input(s): PROBNP in the last 8760 hours. HbA1C: No results for input(s): HGBA1C in the last 72 hours. CBG: No results for input(s): GLUCAP in the last 168 hours. Lipid Profile: No results for input(s): CHOL, HDL, LDLCALC, TRIG, CHOLHDL, LDLDIRECT in the last 72 hours. Thyroid Function Tests: No results for input(s): TSH, T4TOTAL, FREET4, T3FREE, THYROIDAB in the last 72 hours. Anemia Panel: Recent Labs    02/15/21 2008 02/16/21 0339  VITAMINB12 1,013*  --  FOLATE 11.3  --   FERRITIN 81  --   TIBC 224*  --   IRON 10*  --   RETICCTPCT  --  1.2   Sepsis Labs: Recent Labs  Lab 02/15/21 1339 02/16/21 0339  LATICACIDVEN 1.4 0.9    Recent Results (from the past 240 hour(s))  Resp Panel by RT-PCR (Flu A&B, Covid) Nasopharyngeal Swab     Status: None   Collection Time: 02/15/21  1:26 PM   Specimen: Nasopharyngeal Swab; Nasopharyngeal(NP) swabs in vial transport medium  Result Value Ref Range Status   SARS Coronavirus 2 by RT PCR NEGATIVE NEGATIVE Final    Comment: (NOTE) SARS-CoV-2 target nucleic acids are NOT DETECTED.  The SARS-CoV-2 RNA is generally detectable in upper respiratory specimens during the acute phase of infection. The lowest concentration of SARS-CoV-2 viral copies this assay can detect is 138 copies/mL. A negative result does not preclude SARS-Cov-2 infection and should not be used as the sole basis for treatment or other patient management decisions. A negative result may occur with  improper specimen  collection/handling, submission of specimen other than nasopharyngeal swab, presence of viral mutation(s) within the areas targeted by this assay, and inadequate number of viral copies(<138 copies/mL). A negative result must be combined with clinical observations, patient history, and epidemiological information. The expected result is Negative.  Fact Sheet for Patients:  EntrepreneurPulse.com.au  Fact Sheet for Healthcare Providers:  IncredibleEmployment.be  This test is no t yet approved or cleared by the [place] FDA and  has been authorized for detection and/or diagnosis of SARS-CoV-2 by FDA under an Emergency Use Authorization (EUA). This EUA will remain  in effect (meaning this test can be used) for the duration of the COVID-19 declaration under Section 564(b)(1) of the Act, 21 U.S.C.section 360bbb-3(b)(1), unless the authorization is terminated  or revoked sooner.       Influenza A by PCR NEGATIVE NEGATIVE Final   Influenza B by PCR NEGATIVE NEGATIVE Final    Comment: (NOTE) The Xpert Xpress SARS-CoV-2/FLU/RSV plus assay is intended as an aid in the diagnosis of influenza from Nasopharyngeal swab specimens and should not be used as a sole basis for treatment. Nasal washings and aspirates are unacceptable for Xpert Xpress SARS-CoV-2/FLU/RSV testing.  Fact Sheet for Patients: EntrepreneurPulse.com.au  Fact Sheet for Healthcare Providers: IncredibleEmployment.be  This test is not yet approved or cleared by the [place] FDA and has been authorized for detection and/or diagnosis of SARS-CoV-2 by FDA under an Emergency Use Authorization (EUA). This EUA will remain in effect (meaning this test can be used) for the duration of the COVID-19 declaration under Section 564(b)(1) of the Act, 21 U.S.C. section 360bbb-3(b)(1), unless the authorization is terminated or revoked.  Performed at [facility], [place] [address] [place], [place] [address]   Culture, blood (Routine x 2)     Status: None (Preliminary result)   Collection Time: 02/15/21  1:39 PM   Specimen: Right Antecubital; Blood  Result Value Ref Range Status   Specimen Description RIGHT ANTECUBITAL  Final   Special Requests   Final    BOTTLES DRAWN AEROBIC ONLY Blood Culture adequate volume   Culture   Final    NO GROWTH 2 DAYS Performed at [facility], [place] [address] [place], [place] [address]    Report Status PENDING  Incomplete  Culture, blood (Routine x 2)     Status: None (Preliminary result)   Collection Time: 02/15/21  1:39 PM  Specimen: Right Antecubital; Blood  Result Value Ref Range Status   Specimen Description RIGHT ANTECUBITAL  Final   Special Requests   Final    BOTTLES DRAWN AEROBIC ONLY Blood Culture adequate volume   Culture   Final    NO GROWTH 2 DAYS Performed at [facility], [address], [place], [place] [address]    Report Status PENDING  Incomplete      Radiology Studies: DG Chest 2 View  Result Date: 02/15/2021 CLINICAL DATA:  Hemoptysis.  Suspected sepsis. EXAM: CHEST - 2 VIEW COMPARISON:  08/31/2020 FINDINGS: Chronic densities at the right lung base. Upper lungs are clear. Heart and mediastinum are within normal limits. Atherosclerotic calcifications at the aortic arch. No large pleural effusions. Chronic hyperinflation with emphysematous changes. IMPRESSION: 1. No acute cardiopulmonary disease. 2. Emphysema with chronic densities at the right lung base. Electronically Signed   By: Markus Daft M.D.   On: 02/15/2021 14:08   US RENAL  Result Date: 02/15/2021 CLINICAL DATA:  Acute kidney injury. EXAM: RENAL / URINARY TRACT ULTRASOUND COMPLETE COMPARISON:  None. FINDINGS: Right Kidney: Renal measurements: 9.1 cm x 3.8 cm x 3.7 cm = volume: 68.03 mL. Diffusely increased echogenicity of the renal parenchyma is noted with diffuse renal cortical thinning. No mass or  hydronephrosis visualized. Left Kidney: Renal measurements: 9.2 cm x 4.3 cm x 2.9 cm = volume: 60.83 mL. Echogenicity within normal limits. No mass or hydronephrosis visualized. Bladder: Appears normal for degree of bladder distention. Other: Multiple shadowing echogenic gallstones are seen within the gallbladder. IMPRESSION: 1. Increased parenchymal echogenicity and cortical thinning of the right kidney, which may represent sequelae associated with an inflammatory or infectious process. 2. Cholelithiasis. Electronically Signed   By: Virgina Norfolk M.D.   On: 02/15/2021 20:10   ECHOCARDIOGRAM COMPLETE  Result Date: 02/16/2021    ECHOCARDIOGRAM REPORT   Patient Name:   Courtney Cline Date of Exam: 02/16/2021 Medical Rec #:  [id number]              Height:       60.0 in Accession #:    [id number]             Weight:       168.0 lb Date of Birth:  1933/07/30               BSA:          1.733 m Patient Age:    30 years               BP:           92/60 mmHg Patient Gender: F                      HR:           90 bpm. Exam Location:  Inpatient Procedure: 2D Echo, Color Doppler and Cardiac Doppler Indications:    CHF  History:        Patient has no prior history of Echocardiogram examinations.                 COPD; Risk Factors:Hypertension.  Sonographer:    Jyl Heinz Referring Phys: [id number] [place]  1. Left ventricular ejection fraction, by estimation, is 60 to 65%. The left ventricle has normal function. The left ventricle has no regional wall motion abnormalities. Left ventricular diastolic parameters are indeterminate.  2. Right ventricular systolic function is normal. The right ventricular  size is normal.  3. The mitral valve is normal in structure. Trivial mitral valve regurgitation. No evidence of mitral stenosis.  4. The aortic valve is calcified. Aortic valve regurgitation is not visualized. Mild to moderate aortic valve stenosis. Aortic valve area, by VTI measures 1.43 cm. Aortic  valve mean gradient measures 13.3 mmHg. Aortic valve Vmax measures 2.48 m/s.  5. The inferior vena cava is normal in size with greater than 50% respiratory variability, suggesting right atrial pressure of 3 mmHg. Comparison(s): No prior Echocardiogram. FINDINGS  Left Ventricle: Left ventricular ejection fraction, by estimation, is 60 to 65%. The left ventricle has normal function. The left ventricle has no regional wall motion abnormalities. The left ventricular internal cavity size was normal in size. There is  no left ventricular hypertrophy. Left ventricular diastolic function could not be evaluated due to mitral annular calcification (moderate or greater). Left ventricular diastolic parameters are indeterminate. Right Ventricle: The right ventricular size is normal. No increase in right ventricular wall thickness. Right ventricular systolic function is normal. Left Atrium: Left atrial size was normal in size. Right Atrium: Right atrial size was normal in size. Pericardium: There is no evidence of pericardial effusion. Mitral Valve: Moderate mitral annular calcification. The mitral valve is normal in structure. Trivial mitral valve regurgitation. No evidence of mitral valve stenosis. MV peak gradient, 20.0 mmHg. The mean mitral valve gradient is 11.0 mmHg. Tricuspid Valve: The tricuspid valve is normal in structure. Tricuspid valve regurgitation is not demonstrated. No evidence of tricuspid stenosis. Aortic Valve: There appears to be an echo bright rigde above the noncoronary cusp which is seen on the PSL view, suspect this is an artifact as it is not seen in other views. The aortic valve is calcified. Aortic valve regurgitation is not visualized. Mild to moderate aortic stenosis is present. Aortic valve mean gradient measures 13.3 mmHg. Aortic valve peak gradient measures 24.6 mmHg. Aortic valve area, by VTI measures 1.43 cm. Pulmonic Valve: The pulmonic valve was normal in structure. Pulmonic valve  regurgitation is not visualized. No evidence of pulmonic stenosis. Aorta: The aortic root is normal in size and structure. Venous: The inferior vena cava is normal in size with greater than 50% respiratory variability, suggesting right atrial pressure of 3 mmHg. IAS/Shunts: No atrial level shunt detected by color flow Doppler.  LEFT VENTRICLE PLAX 2D LVIDd:         4.60 cm     Diastology LVIDs:         2.90 cm     LV e' medial:    6.20 cm/s LV PW:         1.10 cm     LV E/e' medial:  31.9 LV IVS:        1.10 cm     LV e' lateral:   6.20 cm/s LVOT diam:     1.80 cm     LV E/e' lateral: 31.9 LV SV:         75 LV SV Index:   43 LVOT Area:     2.54 cm  LV Volumes (MOD) LV vol d, MOD A2C: 92.2 ml LV vol d, MOD A4C: 93.8 ml LV vol s, MOD A2C: 33.1 ml LV vol s, MOD A4C: 34.6 ml LV SV MOD A2C:     59.1 ml LV SV MOD A4C:     93.8 ml LV SV MOD BP:      59.3 ml RIGHT VENTRICLE  IVC RV Basal diam:  3.10 cm     IVC diam: 1.50 cm RV Mid diam:    2.90 cm RV S prime:     16.00 cm/s TAPSE (M-mode): 2.8 cm LEFT ATRIUM             Index        RIGHT ATRIUM           Index LA diam:        3.60 cm 2.08 cm/m   RA Area:     13.50 cm LA Vol (A2C):   48.3 ml 27.87 ml/m  RA Volume:   34.10 ml  19.67 ml/m LA Vol (A4C):   48.5 ml 27.98 ml/m LA Biplane Vol: 53.4 ml 30.81 ml/m  AORTIC VALVE AV Area (Vmax):    1.41 cm AV Area (Vmean):   1.34 cm AV Area (VTI):     1.43 cm AV Vmax:           248.00 cm/s AV Vmean:          175.667 cm/s AV VTI:            0.524 m AV Peak Grad:      24.6 mmHg AV Mean Grad:      13.3 mmHg LVOT Vmax:         137.50 cm/s LVOT Vmean:        92.600 cm/s LVOT VTI:          0.295 m LVOT/AV VTI ratio: 0.56  AORTA Ao Root diam: 2.90 cm Ao Asc diam:  3.20 cm MITRAL VALVE                TRICUSPID VALVE MV Area (PHT): 1.99 cm     TR Peak grad:   56.2 mmHg MV Area VTI:   1.10 cm     TR Vmax:        375.00 cm/s MV Peak grad:  20.0 mmHg MV Mean grad:  11.0 mmHg    SHUNTS MV Vmax:       2.23 m/s     Systemic  VTI:  0.30 m MV Vmean:      160.7 cm/s   Systemic Diam: 1.80 cm MV Decel Time: 382 msec MR Peak grad: 113.6 mmHg MR Vmax:      533.00 cm/s MV E velocity: 198.00 cm/s MV A velocity: 202.00 cm/s MV E/A ratio:  0.98 Kardie Tobb DO Electronically signed by Berniece Salines DO Signature Date/Time: 02/16/2021/2:08:57 PM    Final     Scheduled Meds:  amLODipine  5 mg Oral Daily   azithromycin  500 mg Oral Daily   benzonatate  100 mg Oral TID   budesonide (PULMICORT) nebulizer solution  0.25 mg Nebulization BID   cholecalciferol  2,000 Units Oral Daily   feeding supplement  237 mL Oral TID BM   ferrous sulfate  325 mg Oral BID WC   umeclidinium bromide  1 puff Inhalation Daily   And   fluticasone furoate-vilanterol  1 puff Inhalation Daily   guaiFENesin  1,200 mg Oral BID   ketotifen  1 drop Both Eyes BID   methylPREDNISolone (SOLU-MEDROL) injection  60 mg Intravenous Daily   metoprolol tartrate  25 mg Oral BID   multivitamin with minerals  1 tablet Oral Daily   rosuvastatin  40 mg Oral Daily   vitamin B-12  1,000 mcg Oral Daily   Continuous Infusions:  lactated ringers 75 mL/hr at 02/16/21 1046     LOS: 1  day   Time spent: 30 minutes   Darliss Cheney, MD [facility]  02/17/2021, 1:31 PM  Please page via [place] and do not message via secure chat for anything urgent. Secure chat can be used for anything non urgent.  How to contact the [facility] Attending or Consulting provider [place] or covering provider during after hours [place], for this patient?  Check the care team in [place] and look for a) attending/consulting [facility] provider listed and b) the [facility] team listed. Page or secure chat [phone]. Log into [email or web address] and use [place]'s universal password to access. If you do not have the password, please contact the hospital operator. Locate the [facility] provider you are looking for under [facility] and page to a number that you can be directly reached. If you still have difficulty reaching the  provider, please page the [facility] (Director on Call) for the Hospitalists listed on amion for assistance.

## 2021-02-17 NOTE — TOC Progression Note (Addendum)
Transition of Care [facility]) - Progression Note    Patient Details  Name: Courtney Cline MRN: [id number] Date of Birth: 06/27/1933  [facility]) CM/SW Contact  Jacalyn Lefevre Edson Snowball, RN Phone Number: 02/17/2021, 1:32 PM  Clinical Narrative:     Left Amy with [name] message regarding co pay, waiting to hear back.   60 Amy with [facility] requesting home health orders , [facility] can submit to insurance to see what co pays would be   NCM entered orders   Expected Discharge Plan: [place] Barriers to Discharge: Continued Medical Work up  Expected Discharge Plan and Services Expected Discharge Plan: [place]   Discharge Planning Services: CM Consult                     DME Arranged: [name]         HH Arranged: PT, OT           Social Determinants of Health (SDOH) Interventions    Readmission Risk Interventions No flowsheet data found.

## 2021-02-18 LAB — BASIC METABOLIC PANEL
Anion gap: 7 (ref 5–15)
Anion gap: 6 (ref 5–15)
BUN: 38 mg/dL — ABNORMAL HIGH (ref 8–23)
BUN: 41 mg/dL — ABNORMAL HIGH (ref 8–23)
CO2: 21 mmol/L — ABNORMAL LOW (ref 22–32)
CO2: 22 mmol/L (ref 22–32)
Calcium: 9.2 mg/dL (ref 8.9–10.3)
Calcium: 9.1 mg/dL (ref 8.9–10.3)
Chloride: 111 mmol/L (ref 98–111)
Chloride: 111 mmol/L (ref 98–111)
Creatinine, Ser: 1.73 mg/dL — ABNORMAL HIGH (ref 0.44–1.00)
Creatinine, Ser: 1.77 mg/dL — ABNORMAL HIGH (ref 0.44–1.00)
GFR, Estimated: 29 mL/min — ABNORMAL LOW (ref >60)
GFR, Estimated: 28 mL/min — ABNORMAL LOW (ref >60)
Glucose, Bld: 136 mg/dL — ABNORMAL HIGH (ref 70–99)
Glucose, Bld: 176 mg/dL — ABNORMAL HIGH (ref 70–99)
Potassium: 4.8 mmol/L (ref 3.5–5.1)
Potassium: 4.7 mmol/L (ref 3.5–5.1)
Sodium: 139 mmol/L (ref 135–145)
Sodium: 139 mmol/L (ref 135–145)

## 2021-02-18 MED ORDER — APIXABAN 2.5 MG PO TABS
2.5000 mg | ORAL_TABLET | Freq: Two times a day (BID) | ORAL | Status: DC
  Administered 2021-02-18 – 2021-02-19 (×3): 2.5 mg via ORAL
  Filled 2021-02-18 (×3): qty 1

## 2021-02-18 MED ORDER — PREDNISONE 50 MG PO TABS
50.0000 mg | ORAL_TABLET | Freq: Every day | ORAL | Status: DC
  Administered 2021-02-19: 50 mg via ORAL
  Filled 2021-02-18: qty 1

## 2021-02-18 NOTE — Progress Notes (Addendum)
Mobility Specialist Progress Note   02/18/21 1550  Mobility  Activity Ambulated in room  Level of Assistance Contact guard assist, steadying assist  Assistive Device Front wheel walker  Distance Ambulated (ft) 20 ft  Mobility Ambulated with assistance in room  Mobility Response Tolerated well  Mobility performed by Mobility specialist  $Mobility charge 1 Mobility   Received pt in bed having no complaints and agreeable to mobility. Asymptomatic during ambulation >88% SpO2 on 3LO2 throughout, returned back to EOB w/ call bell by side and all needs met.  Holland Falling Mobility Specialist Phone Number [phone]

## 2021-02-18 NOTE — Care Management (Addendum)
Messaged Courtney Cline with [facility] regarding home health. Await response.   Courtney Cline with [name] will get cost and call patient and family directly to discuss.   Patient aware.   NCM confirmed with patient if she decides not to go with [facility] she would like Courtney Cline.   Patient has all DME already. Patient's portable oxygen tank is at bedside.

## 2021-02-18 NOTE — Progress Notes (Signed)
Physical Therapy Treatment Patient Details Name: Courtney Cline MRN: [id number] DOB: 13-Oct-1933 Today's Date: 02/18/2021   History of Present Illness Pt is a 88 y/o F presenting to ED on 1/2 for hemptysis, general malaise,and myalgias. PMH includes COPD on 3L O2 via [place], A fib, HTN, and HLD.    PT Comments    Received pt sitting in recliner requesting to return to bed. Encouraged ambulation, however pt politely declined due to fatigue ultimately requesting to get back in bed. Pt transferred recliner<>bed stand<>pivot with RW and min guard - cues for RW safety as pt balancing with 1 UE support and attempting to step around RW. Sit<>supine with supervision and pt left with all needs within reach. O2 sat 95% on 3L O2 via [place] with activity. Acute PT to cont to follow.    Recommendations for follow up therapy are one component of a multi-disciplinary discharge planning process, led by the attending physician.  Recommendations may be updated based on patient status, additional functional criteria and insurance authorization.  Follow Up Recommendations  Home health PT     Assistance Recommended at Discharge Intermittent Supervision/Assistance  Patient can return home with the following     Equipment Recommendations  Rolling walker (2 wheels)    Recommendations for Other Services       Precautions / Restrictions Precautions Precautions: Fall Precaution Comments: monitor O2 Restrictions Weight Bearing Restrictions: No     Mobility  Bed Mobility Overal bed mobility: Needs Assistance Bed Mobility: Sit to Supine;Rolling Rolling: Supervision     Sit to supine: Supervision   General bed mobility comments: HOB elevated and use of bedrails Patient Response: Cooperative  Transfers Overall transfer level: Needs assistance Equipment used: Rolling walker (2 wheels) Transfers: Sit to/from Stand;Bed to chair/wheelchair/BSC Sit to Stand: Min guard Stand pivot transfers: Min guard          General transfer comment: pt transferred recliner<>bed stand<>pivot with RW and min guard - did require cues for RW safety as pt attempting to balance with 1 UE support on RW and step around it to get to bed.    Ambulation/Gait                   Stairs             Wheelchair Mobility    Modified Rankin (Stroke Patients Only)       Balance Overall balance assessment: Needs assistance Sitting-balance support: Feet supported;No upper extremity supported Sitting balance-Leahy Scale: Fair     Standing balance support: Bilateral upper extremity supported;During functional activity (RW) Standing balance-Leahy Scale: Poor Standing balance comment: able to maintain static and dynamic standing balance with min guard                            Cognition Arousal/Alertness: Awake/alert Behavior During Therapy: WFL for tasks assessed/performed Overall Cognitive Status: Within Functional Limits for tasks assessed                                          Exercises      General Comments General comments (skin integrity, edema, etc.): Pt remained on 3L O2 via [place] during session with O2 sat 95%.      Pertinent Vitals/Pain Pain Assessment: No/denies pain    Home Living  Prior Function            PT Goals (current goals can now be found in the care plan section) Acute Rehab PT Goals Patient Stated Goal: did not state PT Goal Formulation: With patient Time For Goal Achievement: 02/23/21 Potential to Achieve Goals: Good Progress towards PT goals: Progressing toward goals    Frequency    Min 3X/week      PT Plan Current plan remains appropriate    Co-evaluation              AM-PAC PT "6 Clicks" Mobility   Outcome Measure  Help needed turning from your back to your side while in a flat bed without using bedrails?: A Little Help needed moving from lying on your back to sitting  on the side of a flat bed without using bedrails?: A Little Help needed moving to and from a bed to a chair (including a wheelchair)?: A Little Help needed standing up from a chair using your arms (e.g., wheelchair or bedside chair)?: A Little Help needed to walk in hospital room?: A Little Help needed climbing 3-5 steps with a railing? : A Little 6 Click Score: 18    End of Session Equipment Utilized During Treatment: Oxygen;Gait belt Activity Tolerance: Patient limited by fatigue Patient left: in bed;with call bell/phone within reach Nurse Communication: Mobility status PT Visit Diagnosis: Unsteadiness on feet (R26.81);Other abnormalities of gait and mobility (R26.89);Muscle weakness (generalized) (M62.81)     Time: 4332-9518 PT Time Calculation (min) (ACUTE ONLY): 12 min  Charges:  $Therapeutic Activity: 8-22 mins                     Becky Sax PT, DPT  Blenda Nicely 02/18/2021, 12:18 PM

## 2021-02-18 NOTE — Progress Notes (Signed)
PROGRESS NOTE    Courtney Cline  AYT:[id number] DOB: Jun 18, 1933 DOA: 02/15/2021 PCP: Leeroy Cha, MD   Brief Narrative:  Courtney Cline is a 88 y.o. female with medical history significant of chronic hypoxic respite failure secondary to COPD on 3 L oxygen 24/7, chronic A. fib on Eliquis, chronic macrocytic anemia, chronic diastolic CHF, CKD stage II, moderate pulmonary hypertension, presented to the ED on 02/15/21 with cough, shortness of breath, hemoptysis and poor PO intake x 4 days.   Admitted for management of COPD with acute exacerbation, monitoring of hemoptysis and IV hydration for AKI.  Assessment & Plan:   Principal Problem:   COPD with acute exacerbation ([place]) Active Problems:   Chronic hypoxemic respiratory failure (HCC)   Essential hypertension   Hyperlipidemia   Anemia of chronic disease   Diastolic CHF (HCC)   AKI (acute kidney injury) (HCC)   Malnutrition of moderate degree   Cough with hemoptysis  Chronic hypoxic respiratory failure/COPD with acute exacerbation ([place])- (present on admission): She appears very comfortable and is on 3 L of oxygen which is her baseline and she states that she is feeling better than yesterday as well.  She is very close to her baseline as far as her breathing goes.   AKI on CKD stage IIIb: Please note that patient does not have AKI.  She has CKD stage IIIb and now she has AKI on top of CKD stage IIIb.  Her baseline creatinine is 1.3, she presented with 2.1 and now it is improved to 1.7 and has not improved compared to yesterday.  We will continue IV fluids and repeat labs in the morning.   Diastolic CHF ([place])- (present on admission) Appears hypovolemic on admission, AKI. --Lasix on hold --continue metoprolol   Anemia of chronic disease- (present on admission) Iron deficient.  Pt reports taking iron supplement and hx of getting iron infusions.  Folate normal, B12 elevated. Hbg stable.   Essential hypertension-  (present on admission) BP stable to soft.  On IV fluids. --Lasix on hold --On metoprolol   Hyperlipidemia- (present on admission) Continue Crestor   Malnutrition of moderate degree- (present on admission) Related to chronic illness (COPD) as evidenced by mild fat depletion, moderate muscle depletion, severe muscle depletion --Ensure supplements, MVI ordered. --Appreciate dietitian recommendations.   DVT prophylaxis: apixaban (ELIQUIS) tablet 2.5 mg Start: 02/18/21 1245 SCDs Start: 02/15/21 1838   Code Status: Full Code  Family Communication:  None present at bedside.  Plan of care discussed with patient in length and he verbalized understanding and agreed with it.  Status is: Inpatient  Remains inpatient appropriate because: Needs IV fluids.  Estimated body mass index is 32.33 kg/m as calculated from the following:   Height as of this encounter: 5' (1.524 m).   Weight as of this encounter: 75.1 kg.     Nutritional Assessment: Body mass index is 32.33 kg/m.[place] Kitchen Seen by dietician.  I agree with the assessment and plan as outlined below: Nutrition Status: Nutrition Problem: Moderate Malnutrition Etiology: chronic illness (COPD) Signs/Symptoms: mild fat depletion, moderate muscle depletion, severe muscle depletion Interventions: Ensure Enlive (each supplement provides 350kcal and 20 grams of protein), MVI  .  Skin Assessment: I have examined the patient's skin and I agree with the wound assessment as performed by the wound care RN as outlined below:    Consultants:  None  Procedures:  None  Antimicrobials:  Anti-infectives (From admission, onward)    Start     Dose/Rate Route Frequency Ordered  Stop   02/16/21 1845  azithromycin (ZITHROMAX) tablet 500 mg       See Hyperspace for full Linked Orders Report.   500 mg Oral Daily 02/15/21 1840 02/20/21 0959   02/15/21 1845  azithromycin (ZITHROMAX) 500 mg in sodium chloride 0.9 % 250 mL IVPB       See Hyperspace for full  Linked Orders Report.   500 mg 250 mL/hr over 60 Minutes Intravenous Every 24 hours 02/15/21 1840 02/15/21 2108          Subjective: Seen and examined.  She states that her breathing is better than yesterday and now very close to her baseline.  No other complaint.  Objective: Vitals:   02/18/21 0418 02/18/21 0743 02/18/21 0839 02/18/21 0842  BP: 120/68 (!) 130/55    Pulse: 79 82    Resp: 17 18    Temp: 97.8 F (36.6 C) 97.9 F (36.6 C)    TempSrc: Oral Oral    SpO2: 96% 96% 97% 97%  Weight:      Height:        Intake/Output Summary (Last 24 hours) at 02/18/2021 1346 Last data filed at 02/18/2021 0900 Gross per 24 hour  Intake 1296.99 ml  Output 400 ml  Net 896.99 ml    Filed Weights   02/16/21 1500  Weight: 75.1 kg    Examination:  General exam: Appears calm and comfortable  Respiratory system: Clear to auscultation. Respiratory effort normal. Cardiovascular system: S1 & S2 heard, RRR. No JVD, murmurs, rubs, gallops or clicks. No pedal edema. Gastrointestinal system: Abdomen is nondistended, soft and nontender. No organomegaly or masses felt. Normal bowel sounds heard. Central nervous system: Alert and oriented. No focal neurological deficits. Extremities: Symmetric 5 x 5 power. Skin: No rashes, lesions or ulcers.  Psychiatry: Judgement and insight appear normal. Mood & affect appropriate.   Data Reviewed: I have personally reviewed following labs and imaging studies  CBC: Recent Labs  Lab 02/15/21 1339 02/16/21 0339 02/16/21 1344 02/17/21 0212  WBC 8.3 7.0  --  5.8  HGB 9.3* 7.7* 8.5* 7.9*  HCT 32.0* 25.3* 27.7* 25.5*  MCV 101.3* 96.9  --  95.1  PLT 224 216  --  553    Basic Metabolic Panel: Recent Labs  Lab 02/15/21 1339 02/16/21 0339 02/17/21 0212 02/18/21 0139 02/18/21 1227  NA 139 139 138 139 139  K 4.5 4.4 4.7 4.8 4.7  CL 109 111 112* 111 111  CO2 18* 19* 21* 21* 22  GLUCOSE 101* 78 156* 136* 176*  BUN 34* 29* 32* 38* 41*  CREATININE  2.10* 1.83* 1.68* 1.73* 1.77*  CALCIUM 9.1 8.3* 8.8* 9.2 9.1    GFR: Estimated Creatinine Clearance: 21 mL/min (A) (by C-G formula based on SCr of 1.77 mg/dL (H)). Liver Function Tests: Recent Labs  Lab 02/15/21 1339  AST 12*  ALT 8  ALKPHOS 87  BILITOT 0.9  PROT 7.0  ALBUMIN 3.1*    No results for input(s): LIPASE, AMYLASE in the last 168 hours. No results for input(s): AMMONIA in the last 168 hours. Coagulation Profile: Recent Labs  Lab 02/15/21 1339  INR 1.2    Cardiac Enzymes: No results for input(s): CKTOTAL, CKMB, CKMBINDEX, TROPONINI in the last 168 hours. BNP (last 3 results) No results for input(s): PROBNP in the last 8760 hours. HbA1C: No results for input(s): HGBA1C in the last 72 hours. CBG: No results for input(s): GLUCAP in the last 168 hours. Lipid Profile: No results for input(s): CHOL,  HDL, LDLCALC, TRIG, CHOLHDL, LDLDIRECT in the last 72 hours. Thyroid Function Tests: No results for input(s): TSH, T4TOTAL, FREET4, T3FREE, THYROIDAB in the last 72 hours. Anemia Panel: Recent Labs    02/15/21 2008 02/16/21 0339  VITAMINB12 1,013*  --   FOLATE 11.3  --   FERRITIN 81  --   TIBC 224*  --   IRON 10*  --   RETICCTPCT  --  1.2    Sepsis Labs: Recent Labs  Lab 02/15/21 1339 02/16/21 0339  LATICACIDVEN 1.4 0.9     Recent Results (from the past 240 hour(s))  Resp Panel by RT-PCR (Flu A&B, Covid) Nasopharyngeal Swab     Status: None   Collection Time: 02/15/21  1:26 PM   Specimen: Nasopharyngeal Swab; Nasopharyngeal(NP) swabs in vial transport medium  Result Value Ref Range Status   SARS Coronavirus 2 by RT PCR NEGATIVE NEGATIVE Final    Comment: (NOTE) SARS-CoV-2 target nucleic acids are NOT DETECTED.  The SARS-CoV-2 RNA is generally detectable in upper respiratory specimens during the acute phase of infection. The lowest concentration of SARS-CoV-2 viral copies this assay can detect is 138 copies/mL. A negative result does not preclude  SARS-Cov-2 infection and should not be used as the sole basis for treatment or other patient management decisions. A negative result may occur with  improper specimen collection/handling, submission of specimen other than nasopharyngeal swab, presence of viral mutation(s) within the areas targeted by this assay, and inadequate number of viral copies(<138 copies/mL). A negative result must be combined with clinical observations, patient history, and epidemiological information. The expected result is Negative.  Fact Sheet for Patients:  EntrepreneurPulse.com.au  Fact Sheet for Healthcare Providers:  IncredibleEmployment.be  This test is no t yet approved or cleared by the [place] FDA and  has been authorized for detection and/or diagnosis of SARS-CoV-2 by FDA under an Emergency Use Authorization (EUA). This EUA will remain  in effect (meaning this test can be used) for the duration of the COVID-19 declaration under Section 564(b)(1) of the Act, 21 U.S.C.section 360bbb-3(b)(1), unless the authorization is terminated  or revoked sooner.       Influenza A by PCR NEGATIVE NEGATIVE Final   Influenza B by PCR NEGATIVE NEGATIVE Final    Comment: (NOTE) The Xpert Xpress SARS-CoV-2/FLU/RSV plus assay is intended as an aid in the diagnosis of influenza from Nasopharyngeal swab specimens and should not be used as a sole basis for treatment. Nasal washings and aspirates are unacceptable for Xpert Xpress SARS-CoV-2/FLU/RSV testing.  Fact Sheet for Patients: EntrepreneurPulse.com.au  Fact Sheet for Healthcare Providers: IncredibleEmployment.be  This test is not yet approved or cleared by the [place] FDA and has been authorized for detection and/or diagnosis of SARS-CoV-2 by FDA under an Emergency Use Authorization (EUA). This EUA will remain in effect (meaning this test can be used) for the duration of  the COVID-19 declaration under Section 564(b)(1) of the Act, 21 U.S.C. section 360bbb-3(b)(1), unless the authorization is terminated or revoked.  Performed at [facility], [place] [address], [place], [place] [address]   Culture, blood (Routine x 2)     Status: None (Preliminary result)   Collection Time: 02/15/21  1:39 PM   Specimen: Right Antecubital; Blood  Result Value Ref Range Status   Specimen Description RIGHT ANTECUBITAL  Final   Special Requests   Final    BOTTLES DRAWN AEROBIC ONLY Blood Culture adequate volume   Culture   Final    NO GROWTH  3 DAYS Performed at [facility], [place] [address], [place], [place] [address]    Report Status PENDING  Incomplete  Culture, blood (Routine x 2)     Status: None (Preliminary result)   Collection Time: 02/15/21  1:39 PM   Specimen: Right Antecubital; Blood  Result Value Ref Range Status   Specimen Description RIGHT ANTECUBITAL  Final   Special Requests   Final    BOTTLES DRAWN AEROBIC ONLY Blood Culture adequate volume   Culture   Final    NO GROWTH 3 DAYS Performed at [facility], [place] [address] [place], [place] [address]    Report Status PENDING  Incomplete       Radiology Studies: No results found.  Scheduled Meds:  amLODipine  5 mg Oral Daily   apixaban  2.5 mg Oral BID   azithromycin  500 mg Oral Daily   benzonatate  100 mg Oral TID   budesonide (PULMICORT) nebulizer solution  0.25 mg Nebulization BID   cholecalciferol  2,000 Units Oral Daily   feeding supplement  237 mL Oral TID BM   ferrous sulfate  325 mg Oral BID WC   umeclidinium bromide  1 puff Inhalation Daily   And   fluticasone furoate-vilanterol  1 puff Inhalation Daily   guaiFENesin  1,200 mg Oral BID   ketotifen  1 drop Both Eyes BID   methylPREDNISolone (SOLU-MEDROL) injection  60 mg Intravenous Daily   metoprolol tartrate  25 mg Oral BID   multivitamin with minerals  1 tablet Oral Daily   rosuvastatin  40 mg Oral Daily    vitamin B-12  1,000 mcg Oral Daily   Continuous Infusions:  lactated ringers 75 mL/hr at 02/18/21 0329     LOS: 2 days   Time spent: 25 minutes   Darliss Cheney, MD [facility]  02/18/2021, 1:46 PM  Please page via [place] [place] and do not message via secure chat for anything urgent. Secure chat can be used for anything non urgent.  How to contact the [facility] Attending or Consulting provider [place] or covering provider during after hours [place], for this patient?  Check the care team in [facility] and look for a) attending/consulting [facility] provider listed and b) the [facility] team listed. Page or secure chat [phone]. Log into [email or web address] and use [place]'s universal password to access. If you do not have the password, please contact the hospital operator. Locate the [facility] provider you are looking for under [facility] and page to a number that you can be directly reached. If you still have difficulty reaching the provider, please page the [facility] (Director on Call) for the Hospitalists listed on amion for assistance.

## 2021-02-18 NOTE — Plan of Care (Signed)

## 2021-02-19 LAB — BASIC METABOLIC PANEL
Anion gap: 6 (ref 5–15)
BUN: 44 mg/dL — ABNORMAL HIGH (ref 8–23)
CO2: 24 mmol/L (ref 22–32)
Calcium: 9.1 mg/dL (ref 8.9–10.3)
Chloride: 111 mmol/L (ref 98–111)
Creatinine, Ser: 1.81 mg/dL — ABNORMAL HIGH (ref 0.44–1.00)
GFR, Estimated: 27 mL/min — ABNORMAL LOW (ref >60)
Glucose, Bld: 169 mg/dL — ABNORMAL HIGH (ref 70–99)
Potassium: 5.3 mmol/L — ABNORMAL HIGH (ref 3.5–5.1)
Sodium: 141 mmol/L (ref 135–145)

## 2021-02-19 MED ORDER — SODIUM ZIRCONIUM CYCLOSILICATE 10 G PO PACK
10.0000 g | PACK | Freq: Once | ORAL | Status: AC
  Administered 2021-02-19: 10 g via ORAL
  Filled 2021-02-19: qty 1

## 2021-02-19 MED ORDER — FUROSEMIDE 40 MG PO TABS: 20.0000 mg | ORAL_TABLET | Freq: Two times a day (BID) | ORAL | 0 refills | Status: DC

## 2021-02-19 MED ORDER — APIXABAN 2.5 MG PO TABS: 2.5000 mg | ORAL_TABLET | Freq: Two times a day (BID) | ORAL | 0 refills | Status: DC

## 2021-02-19 NOTE — Care Management Important Message (Signed)
Important Message  Patient Details  Name: Courtney Cline MRN: [id number] Date of Birth: 1933-04-03   Medicare Important Message Given:  Yes     Hannah Beat 02/19/2021, 11:56 AM

## 2021-02-19 NOTE — Care Management (Signed)
Notified Amy with [facility] that discharge order is in.

## 2021-02-19 NOTE — Discharge Summary (Signed)
Physician Discharge Summary  Thomas Rhude Shehadeh MBW:[id number] DOB: 01-16-1934 DOA: 02/15/2021  PCP: Leeroy Cha, MD  Admit date: 02/15/2021 Discharge date: 02/19/2021 30 Day Unplanned Readmission Risk Score    Flowsheet Row ED to Hosp-Admission (Current) from 02/15/2021 in [place]  30 Day Unplanned Readmission Risk Score (%) 20.34 Filed at 02/19/2021 0801       This score is the patient's risk of an unplanned readmission within 30 days of being discharged (0 -100%). The score is based on dignosis, age, lab data, medications, orders, and past utilization.   Low:  0-14.9   Medium: 15-21.9   High: 22-29.9   Extreme: 30 and above          Admitted From: home Disposition:  home  Recommendations for Outpatient Follow-up:  Follow up with PCP in 1-2 weeks Please obtain BMP/CBC in one week Please follow up with your PCP on the following pending results: Unresulted Labs (From admission, onward)     Start     Ordered   02/15/21 1324  Urinalysis, Routine w reflex microscopic  Once,   STAT        02/15/21 1325              Home Health:yes  Equipment/Devices: None  Discharge Condition: Stable CODE STATUS: Full code Diet recommendation: Cardiac  Subjective: Seen and examined.  She has no complaints.  Her breathing is back to baseline and she is happy that she is being discharged today.  Brief/Interim Summary: Kaylia Winborne is a 88 y.o. female with medical history significant of chronic hypoxic respiratory failure secondary to COPD on 3 L oxygen 24/7, pulm A. fib on Eliquis, chronic macrocytic anemia, chronic diastolic CHF, CKD stage IIIb, moderate pulmonary hypertension, presented to the ED on 02/15/21 with cough, shortness of breath, hemoptysis and poor PO intake x 4 days. Admitted for management of COPD with acute exacerbation, monitoring of hemoptysis and IV hydration for AKI.  She is requiring 3 L oxygen which is her  baseline.  She was started on IV Solu-Medrol, bronchodilators and antibiotics.  She has now completed therapy for 5 days, her breathing is at baseline, no wheezes on examination.  She is being discharged home in stable condition.  No further need of continued prednisone at discharge.  AKI on CKD stage IIIb: Please note that patient does not have AKI as mentioned in previous note/H&P.Marland Kitchen  She had CKD stage IIIb and during this hospitalization, her creatinine was found to be elevated, initially it was presumed that she had AKI on CKD stage IIIb and that is what is documented in the notes however now that we have evidence that her creatinine has remained stable around 2.8 with GFR around 27, she does seem to have accomplished a new baseline which would be CKD stage IV.   Chronic diastolic congestive heart failure/bilateral lower extremity edema: Due to elevated creatinine, she was not given her routine Lasix that she takes 40 mg p.o. twice daily at home and despite of this, she did not develop fluid overload, pulmonary edema or worsening edema.  Now that her creatinine baseline is elevated, I am reducing her Lasix to 20 mg twice daily.  Will defer to her cardiologist if this needs to be changed again.  Permanent atrial fibrillation: Resume metoprolol but reduce dose of Eliquis from 5 mg twice daily to 2.5 mg twice daily based on her renal function.   Anemia of chronic disease- (present on admission)  Iron deficient.  Pt reports taking iron supplement and hx of getting iron infusions.  Folate normal, B12 elevated. Hbg stable.   Essential hypertension-patient's Lasix and lisinopril were held due to elevated creatinine and amlodipine 5 mg was added and her blood pressure remained controlled on this.  I am discontinuing her lisinopril as she does not have any systolic dysfunction.  Discharging on amlodipine though.   Hyperlipidemia- (present on admission) Continue Crestor   Malnutrition of moderate degree-  (present on admission) Related to chronic illness (COPD) as evidenced by mild fat depletion, moderate muscle depletion, severe muscle depletion.  Encouraged proper diet.  Discharge Diagnoses:  Principal Problem:   COPD with acute exacerbation ([facility]) Active Problems:   Chronic hypoxemic respiratory failure (HCC)   Essential hypertension   Hyperlipidemia   Anemia of chronic disease   Diastolic CHF (HCC)   AKI (acute kidney injury) (HCC)   Malnutrition of moderate degree   Cough with hemoptysis    Discharge Instructions   Allergies as of 02/19/2021   No Known Allergies      Medication List     STOP taking these medications    lisinopril 40 MG tablet Commonly known as: ZESTRIL       TAKE these medications    acetaminophen 325 MG tablet Commonly known as: TYLENOL Take 650 mg by mouth every 6 (six) hours as needed for mild pain or headache.   apixaban 2.5 MG Tabs tablet Commonly known as: ELIQUIS Take 1 tablet (2.5 mg total) by mouth 2 (two) times daily. What changed:  medication strength how much to take   furosemide 40 MG tablet Commonly known as: LASIX Take 0.5 tablets (20 mg total) by mouth 2 (two) times daily. <PLEASE MAKE APPOINTMENT FOR REFILLS> What changed:  how much to take when to take this   metoprolol tartrate 25 MG tablet Commonly known as: LOPRESSOR Take 1 tablet (25 mg total) by mouth 2 (two) times daily.   ProAir HFA 108 (90 Base) MCG/ACT inhaler Generic drug: albuterol TAKE 2 PUFFS BY MOUTH EVERY 6 HOURS AS NEEDED FOR WHEEZE OR SHORTNESS OF BREATH What changed: See the new instructions.   rosuvastatin 40 MG tablet Commonly known as: CRESTOR Take 40 mg by mouth daily.   Trelegy Ellipta 100-62.5-25 MCG/ACT Aepb Generic drug: Fluticasone-Umeclidin-Vilant Inhale 100 mcg into the lungs daily.   VITAMIN B 12 PO Take 1 tablet by mouth daily.   Vitamin D 50 MCG (2000 UT) Caps Take 2,000 Units by mouth daily.        Follow-up  Information     Leeroy Cha, MD .   Specialty: Internal Medicine Contact information: [address] [place] [address] [phone]         [facility] Follow up.   Specialty: Home Health Services Why: [place] information: [place] [address] [phone]         Leeroy Cha, MD Follow up in 1 week(s).   Specialty: Internal Medicine Contact information: [address] [place] [address] [phone]                No Known Allergies  Consultations: None   Procedures/Studies: DG Chest 2 View  Result Date: 02/15/2021 CLINICAL DATA:  Hemoptysis.  Suspected sepsis. EXAM: CHEST - 2 VIEW COMPARISON:  08/31/2020 FINDINGS: Chronic densities at the right lung base. Upper lungs are clear. Heart and mediastinum are within normal limits. Atherosclerotic calcifications at the  aortic arch. No large pleural effusions. Chronic hyperinflation with emphysematous changes. IMPRESSION: 1. No acute cardiopulmonary disease. 2. Emphysema with chronic densities at the right lung base. Electronically Signed   By: Markus Daft M.D.   On: 02/15/2021 14:08   US RENAL  Result Date: 02/15/2021 CLINICAL DATA:  Acute kidney injury. EXAM: RENAL / URINARY TRACT ULTRASOUND COMPLETE COMPARISON:  None. FINDINGS: Right Kidney: Renal measurements: 9.1 cm x 3.8 cm x 3.7 cm = volume: 68.03 mL. Diffusely increased echogenicity of the renal parenchyma is noted with diffuse renal cortical thinning. No mass or hydronephrosis visualized. Left Kidney: Renal measurements: 9.2 cm x 4.3 cm x 2.9 cm = volume: 60.83 mL. Echogenicity within normal limits. No mass or hydronephrosis visualized. Bladder: Appears normal for degree of bladder distention. Other: Multiple shadowing echogenic gallstones are seen within the gallbladder. IMPRESSION: 1. Increased parenchymal echogenicity and cortical thinning of the right kidney,  which may represent sequelae associated with an inflammatory or infectious process. 2. Cholelithiasis. Electronically Signed   By: Virgina Norfolk M.D.   On: 02/15/2021 20:10   ECHOCARDIOGRAM COMPLETE  Result Date: 02/16/2021    ECHOCARDIOGRAM REPORT   Patient Name:   JISELA MERLINO Date of Exam: 02/16/2021 Medical Rec #:  [id number]              Height:       60.0 in Accession #:    [id number]             Weight:       168.0 lb Date of Birth:  1933-11-07               BSA:          1.733 m Patient Age:    88 years               BP:           92/60 mmHg Patient Gender: F                      HR:           90 bpm. Exam Location:  Inpatient Procedure: 2D Echo, Color Doppler and Cardiac Doppler Indications:    CHF  History:        Patient has no prior history of Echocardiogram examinations.                 COPD; Risk Factors:Hypertension.  Sonographer:    Jyl Heinz Referring Phys: [id number] [place]  1. Left ventricular ejection fraction, by estimation, is 60 to 65%. The left ventricle has normal function. The left ventricle has no regional wall motion abnormalities. Left ventricular diastolic parameters are indeterminate.  2. Right ventricular systolic function is normal. The right ventricular size is normal.  3. The mitral valve is normal in structure. Trivial mitral valve regurgitation. No evidence of mitral stenosis.  4. The aortic valve is calcified. Aortic valve regurgitation is not visualized. Mild to moderate aortic valve stenosis. Aortic valve area, by VTI measures 1.43 cm. Aortic valve mean gradient measures 13.3 mmHg. Aortic valve Vmax measures 2.48 m/s.  5. The inferior vena cava is normal in size with greater than 50% respiratory variability, suggesting right atrial pressure of 3 mmHg. Comparison(s): No prior Echocardiogram. FINDINGS  Left Ventricle: Left ventricular ejection fraction, by estimation, is 60 to 65%. The left ventricle has normal function. The left ventricle has no  regional wall motion abnormalities. The left ventricular internal cavity  size was normal in size. There is  no left ventricular hypertrophy. Left ventricular diastolic function could not be evaluated due to mitral annular calcification (moderate or greater). Left ventricular diastolic parameters are indeterminate. Right Ventricle: The right ventricular size is normal. No increase in right ventricular wall thickness. Right ventricular systolic function is normal. Left Atrium: Left atrial size was normal in size. Right Atrium: Right atrial size was normal in size. Pericardium: There is no evidence of pericardial effusion. Mitral Valve: Moderate mitral annular calcification. The mitral valve is normal in structure. Trivial mitral valve regurgitation. No evidence of mitral valve stenosis. MV peak gradient, 20.0 mmHg. The mean mitral valve gradient is 11.0 mmHg. Tricuspid Valve: The tricuspid valve is normal in structure. Tricuspid valve regurgitation is not demonstrated. No evidence of tricuspid stenosis. Aortic Valve: There appears to be an echo bright rigde above the noncoronary cusp which is seen on the PSL view, suspect this is an artifact as it is not seen in other views. The aortic valve is calcified. Aortic valve regurgitation is not visualized. Mild to moderate aortic stenosis is present. Aortic valve mean gradient measures 13.3 mmHg. Aortic valve peak gradient measures 24.6 mmHg. Aortic valve area, by VTI measures 1.43 cm. Pulmonic Valve: The pulmonic valve was normal in structure. Pulmonic valve regurgitation is not visualized. No evidence of pulmonic stenosis. Aorta: The aortic root is normal in size and structure. Venous: The inferior vena cava is normal in size with greater than 50% respiratory variability, suggesting right atrial pressure of 3 mmHg. IAS/Shunts: No atrial level shunt detected by color flow Doppler.  LEFT VENTRICLE PLAX 2D LVIDd:         4.60 cm     Diastology LVIDs:         2.90 cm     LV  e' medial:    6.20 cm/s LV PW:         1.10 cm     LV E/e' medial:  31.9 LV IVS:        1.10 cm     LV e' lateral:   6.20 cm/s LVOT diam:     1.80 cm     LV E/e' lateral: 31.9 LV SV:         75 LV SV Index:   43 LVOT Area:     2.54 cm  LV Volumes (MOD) LV vol d, MOD A2C: 92.2 ml LV vol d, MOD A4C: 93.8 ml LV vol s, MOD A2C: 33.1 ml LV vol s, MOD A4C: 34.6 ml LV SV MOD A2C:     59.1 ml LV SV MOD A4C:     93.8 ml LV SV MOD BP:      59.3 ml RIGHT VENTRICLE             IVC RV Basal diam:  3.10 cm     IVC diam: 1.50 cm RV Mid diam:    2.90 cm RV S prime:     16.00 cm/s TAPSE (M-mode): 2.8 cm LEFT ATRIUM             Index        RIGHT ATRIUM           Index LA diam:        3.60 cm 2.08 cm/m   RA Area:     13.50 cm LA Vol (A2C):   48.3 ml 27.87 ml/m  RA Volume:   34.10 ml  19.67 ml/m LA Vol (A4C):   48.5 ml 27.98  ml/m LA Biplane Vol: 53.4 ml 30.81 ml/m  AORTIC VALVE AV Area (Vmax):    1.41 cm AV Area (Vmean):   1.34 cm AV Area (VTI):     1.43 cm AV Vmax:           248.00 cm/s AV Vmean:          175.667 cm/s AV VTI:            0.524 m AV Peak Grad:      24.6 mmHg AV Mean Grad:      13.3 mmHg LVOT Vmax:         137.50 cm/s LVOT Vmean:        92.600 cm/s LVOT VTI:          0.295 m LVOT/AV VTI ratio: 0.56  AORTA Ao Root diam: 2.90 cm Ao Asc diam:  3.20 cm MITRAL VALVE                TRICUSPID VALVE MV Area (PHT): 1.99 cm     TR Peak grad:   56.2 mmHg MV Area VTI:   1.10 cm     TR Vmax:        375.00 cm/s MV Peak grad:  20.0 mmHg MV Mean grad:  11.0 mmHg    SHUNTS MV Vmax:       2.23 m/s     Systemic VTI:  0.30 m MV Vmean:      160.7 cm/s   Systemic Diam: 1.80 cm MV Decel Time: 382 msec MR Peak grad: 113.6 mmHg MR Vmax:      533.00 cm/s MV E velocity: 198.00 cm/s MV A velocity: 202.00 cm/s MV E/A ratio:  0.98 Kardie Tobb DO Electronically signed by Berniece Salines DO Signature Date/Time: 02/16/2021/2:08:57 PM    Final      Discharge Exam: Vitals:   02/19/21 0826 02/19/21 0827  BP:    Pulse:    Resp:    Temp:     SpO2: 96% 96%   Vitals:   02/18/21 2000 02/19/21 0452 02/19/21 0826 02/19/21 0827  BP:  124/67    Pulse:  64    Resp:  18    Temp:  97.7 F (36.5 C)    TempSrc:  Oral    SpO2: 95% 100% 96% 96%  Weight:      Height:        General: Pt is alert, awake, not in acute distress Cardiovascular: RRR, S1/S2 +, no rubs, no gallops Respiratory: CTA bilaterally, no wheezing, no rhonchi Abdominal: Soft, NT, ND, bowel sounds + Extremities: +1 pitting edema bilateral lower extremity, no cyanosis    The results of significant diagnostics from this hospitalization (including imaging, microbiology, ancillary and laboratory) are listed below for reference.     Microbiology: Recent Results (from the past 240 hour(s))  Resp Panel by RT-PCR (Flu A&B, Covid) Nasopharyngeal Swab     Status: None   Collection Time: 02/15/21  1:26 PM   Specimen: Nasopharyngeal Swab; Nasopharyngeal(NP) swabs in vial transport medium  Result Value Ref Range Status   SARS Coronavirus 2 by RT PCR NEGATIVE NEGATIVE Final    Comment: (NOTE) SARS-CoV-2 target nucleic acids are NOT DETECTED.  The SARS-CoV-2 RNA is generally detectable in upper respiratory specimens during the acute phase of infection. The lowest concentration of SARS-CoV-2 viral copies this assay can detect is 138 copies/mL. A negative result does not preclude SARS-Cov-2 infection and should not be used as the sole basis for treatment or other  patient management decisions. A negative result may occur with  improper specimen collection/handling, submission of specimen other than nasopharyngeal swab, presence of viral mutation(s) within the areas targeted by this assay, and inadequate number of viral copies(<138 copies/mL). A negative result must be combined with clinical observations, patient history, and epidemiological information. The expected result is Negative.  Fact Sheet for Patients:  EntrepreneurPulse.com.au  Fact Sheet  for Healthcare Providers:  IncredibleEmployment.be  This test is no t yet approved or cleared by the [place] FDA and  has been authorized for detection and/or diagnosis of SARS-CoV-2 by FDA under an Emergency Use Authorization (EUA). This EUA will remain  in effect (meaning this test can be used) for the duration of the COVID-19 declaration under Section 564(b)(1) of the Act, 21 U.S.C.section 360bbb-3(b)(1), unless the authorization is terminated  or revoked sooner.       Influenza A by PCR NEGATIVE NEGATIVE Final   Influenza B by PCR NEGATIVE NEGATIVE Final    Comment: (NOTE) The Xpert Xpress SARS-CoV-2/FLU/RSV plus assay is intended as an aid in the diagnosis of influenza from Nasopharyngeal swab specimens and should not be used as a sole basis for treatment. Nasal washings and aspirates are unacceptable for Xpert Xpress SARS-CoV-2/FLU/RSV testing.  Fact Sheet for Patients: EntrepreneurPulse.com.au  Fact Sheet for Healthcare Providers: IncredibleEmployment.be  This test is not yet approved or cleared by the [place] FDA and has been authorized for detection and/or diagnosis of SARS-CoV-2 by FDA under an Emergency Use Authorization (EUA). This EUA will remain in effect (meaning this test can be used) for the duration of the COVID-19 declaration under Section 564(b)(1) of the Act, 21 U.S.C. section 360bbb-3(b)(1), unless the authorization is terminated or revoked.  Performed at [facility], [place] [address], [place], [place] [address]   Culture, blood (Routine x 2)     Status: None (Preliminary result)   Collection Time: 02/15/21  1:39 PM   Specimen: Right Antecubital; Blood  Result Value Ref Range Status   Specimen Description RIGHT ANTECUBITAL  Final   Special Requests   Final    BOTTLES DRAWN AEROBIC ONLY Blood Culture adequate volume   Culture   Final    NO GROWTH 3 DAYS Performed at [facility], [place] [address] [place], [place] [address]    Report Status PENDING  Incomplete  Culture, blood (Routine x 2)     Status: None (Preliminary result)   Collection Time: 02/15/21  1:39 PM   Specimen: Right Antecubital; Blood  Result Value Ref Range Status   Specimen Description RIGHT ANTECUBITAL  Final   Special Requests   Final    BOTTLES DRAWN AEROBIC ONLY Blood Culture adequate volume   Culture   Final    NO GROWTH 3 DAYS Performed at [facility], [place] [address] [place], [place] [address]    Report Status PENDING  Incomplete     Labs: BNP (last 3 results) No results for input(s): BNP in the last 8760 hours. Basic Metabolic Panel: Recent Labs  Lab 02/16/21 0339 02/17/21 0212 02/18/21 0139 02/18/21 1227 02/19/21 0110  NA 139 138 139 139 141  K 4.4 4.7 4.8 4.7 5.3*  CL 111 112* 111 111 111  CO2 19* 21* 21* 22 24  GLUCOSE 78 156* 136* 176* 169*  BUN 29* 32* 38* 41* 44*  CREATININE 1.83* 1.68* 1.73* 1.77* 1.81*  CALCIUM 8.3* 8.8* 9.2 9.1 9.1   Liver Function Tests: Recent Labs  Lab  02/15/21 1339  AST 12*  ALT 8  ALKPHOS 87  BILITOT 0.9  PROT 7.0  ALBUMIN 3.1*   No results for input(s): LIPASE, AMYLASE in the last 168 hours. No results for input(s): AMMONIA in the last 168 hours. CBC: Recent Labs  Lab 02/15/21 1339 02/16/21 0339 02/16/21 1344 02/17/21 0212  WBC 8.3 7.0  --  5.8  HGB 9.3* 7.7* 8.5* 7.9*  HCT 32.0* 25.3* 27.7* 25.5*  MCV 101.3* 96.9  --  95.1  PLT 224 216  --  226   Cardiac Enzymes: No results for input(s): CKTOTAL, CKMB, CKMBINDEX, TROPONINI in the last 168 hours. BNP: Invalid input(s): POCBNP CBG: No results for input(s): GLUCAP in the last 168 hours. D-Dimer No results for input(s): DDIMER in the last 72 hours. Hgb A1c No results for input(s): HGBA1C in the last 72 hours. Lipid Profile No results for input(s): CHOL, HDL, LDLCALC, TRIG, CHOLHDL, LDLDIRECT in the last 72 hours. Thyroid function studies No results  for input(s): TSH, T4TOTAL, T3FREE, THYROIDAB in the last 72 hours.  Invalid input(s): FREET3 Anemia work up No results for input(s): VITAMINB12, FOLATE, FERRITIN, TIBC, IRON, RETICCTPCT in the last 72 hours. Urinalysis    Component Value Date/Time   COLORURINE AMBER (A) 01/20/2020 1746   APPEARANCEUR CLOUDY (A) 01/20/2020 1746   LABSPEC 1.020 01/20/2020 1746   PHURINE 5.0 01/20/2020 1746   GLUCOSEU NEGATIVE 01/20/2020 1746   HGBUR MODERATE (A) 01/20/2020 1746   BILIRUBINUR NEGATIVE 01/20/2020 1746   KETONESUR 5 (A) 01/20/2020 1746   PROTEINUR 30 (A) 01/20/2020 1746   NITRITE NEGATIVE 01/20/2020 1746   LEUKOCYTESUR LARGE (A) 01/20/2020 1746   Sepsis Labs Invalid input(s): PROCALCITONIN,  WBC,  LACTICIDVEN Microbiology Recent Results (from the past 240 hour(s))  Resp Panel by RT-PCR (Flu A&B, Covid) Nasopharyngeal Swab     Status: None   Collection Time: 02/15/21  1:26 PM   Specimen: Nasopharyngeal Swab; Nasopharyngeal(NP) swabs in vial transport medium  Result Value Ref Range Status   SARS Coronavirus 2 by RT PCR NEGATIVE NEGATIVE Final    Comment: (NOTE) SARS-CoV-2 target nucleic acids are NOT DETECTED.  The SARS-CoV-2 RNA is generally detectable in upper respiratory specimens during the acute phase of infection. The lowest concentration of SARS-CoV-2 viral copies this assay can detect is 138 copies/mL. A negative result does not preclude SARS-Cov-2 infection and should not be used as the sole basis for treatment or other patient management decisions. A negative result may occur with  improper specimen collection/handling, submission of specimen other than nasopharyngeal swab, presence of viral mutation(s) within the areas targeted by this assay, and inadequate number of viral copies(<138 copies/mL). A negative result must be combined with clinical observations, patient history, and epidemiological information. The expected result is Negative.  Fact Sheet for Patients:   EntrepreneurPulse.com.au  Fact Sheet for Healthcare Providers:  IncredibleEmployment.be  This test is no t yet approved or cleared by the [place] FDA and  has been authorized for detection and/or diagnosis of SARS-CoV-2 by FDA under an Emergency Use Authorization (EUA). This EUA will remain  in effect (meaning this test can be used) for the duration of the COVID-19 declaration under Section 564(b)(1) of the Act, 21 U.S.C.section 360bbb-3(b)(1), unless the authorization is terminated  or revoked sooner.       Influenza A by PCR NEGATIVE NEGATIVE Final   Influenza B by PCR NEGATIVE NEGATIVE Final    Comment: (NOTE) The Xpert Xpress SARS-CoV-2/FLU/RSV plus assay is intended as an aid  in the diagnosis of influenza from Nasopharyngeal swab specimens and should not be used as a sole basis for treatment. Nasal washings and aspirates are unacceptable for Xpert Xpress SARS-CoV-2/FLU/RSV testing.  Fact Sheet for Patients: EntrepreneurPulse.com.au  Fact Sheet for Healthcare Providers: IncredibleEmployment.be  This test is not yet approved or cleared by the [place] FDA and has been authorized for detection and/or diagnosis of SARS-CoV-2 by FDA under an Emergency Use Authorization (EUA). This EUA will remain in effect (meaning this test can be used) for the duration of the COVID-19 declaration under Section 564(b)(1) of the Act, 21 U.S.C. section 360bbb-3(b)(1), unless the authorization is terminated or revoked.  Performed at [facility], [place] [address], [place], [place] [address]   Culture, blood (Routine x 2)     Status: None (Preliminary result)   Collection Time: 02/15/21  1:39 PM   Specimen: Right Antecubital; Blood  Result Value Ref Range Status   Specimen Description RIGHT ANTECUBITAL  Final   Special Requests   Final    BOTTLES DRAWN AEROBIC ONLY Blood Culture adequate volume    Culture   Final    NO GROWTH 3 DAYS Performed at [facility], [place] [address] [place], [place] [address]    Report Status PENDING  Incomplete  Culture, blood (Routine x 2)     Status: None (Preliminary result)   Collection Time: 02/15/21  1:39 PM   Specimen: Right Antecubital; Blood  Result Value Ref Range Status   Specimen Description RIGHT ANTECUBITAL  Final   Special Requests   Final    BOTTLES DRAWN AEROBIC ONLY Blood Culture adequate volume   Culture   Final    NO GROWTH 3 DAYS Performed at [facility], [place] [address], [place], [place] [address]    Report Status PENDING  Incomplete     Time coordinating discharge: Over 30 minutes  SIGNED:   Darliss Cheney, MD  [facility] 02/19/2021, 8:41 AM  If 7PM-7AM, please contact night-coverage [email or web address]

## 2021-02-20 LAB — CULTURE, BLOOD (ROUTINE X 2)
Culture: NO GROWTH
Culture: NO GROWTH
Special Requests: ADEQUATE
Special Requests: ADEQUATE

## 2021-03-04 NOTE — Progress Notes (Deleted)
Cardiology Office Note:    Date:  03/04/2021   ID:  Courtney Cline, DOB [birth year], MRN [id number]  PCP:  Leeroy Cha, MD  Cardiologist:  None  Electrophysiologist:  None   Referring MD: Leeroy Cha,*   Chief Complaint: follow-up of CHF, atrial fibrillation, and PAD  History of Present Illness:    Courtney Cline is a 88 y.o. female with a history of chronic diastolic CHF, paroxysmal atrial fibrillation on Eliquis, PAD with left subclavian artery stenosis, hypertension, hyperlipidemia, COPD with remote tobacco use, CKD stage IV, anemia of chronic disease, and prior breast cancer who is followed by Dr. Gwenlyn Found and presents today for routine follow-up.   Patient was initially referred to Dr. Gwenlyn Found for evaluation of PAD because of presumed left subclavian artery stenosis which was confirmed on dopplers. However, patient asymptomatic with this so conservative treatment was recommended. Since that time she has also been diagnosed with chronic diastolic CHF and persistent atrial fibrillation. She is on chronic anticoagulation with Eliquis. Patient was last seen by Dr. Gwenlyn Found in 08/2020 at which time she was stable from a cardiac standpoint.  Since last visit, patient recently admitted from 02/15/2021 to 02/19/2021 for COPD exacerbation and AKI. Echo during admission showed LVEF of 60-65% with normal wall motion and mild to moderate AS. She was treated with IV steroids, bronchodilators, and antibiotics as well as some IV hydration with improvement.   Patient presents today for routine follow-up. ***  Chronic Diastolic CHF Recent Echo on 02/16/2021 showed LVEF of 60-65% with normal wall motion and mild to moderate AS.  - Euvolemic on exam.  - Continue Lasix 20mg  twice daily. - Continue daily weights and sodium/fluid restrictions.  Persistent Atrial Fibrillation Maintaining sinus rhythm on exam. *** - Continue Lopressor 25mg  twice daily. - Continue Eliquis 2.5mg  twice  daily (reduced dosing due to age and renal function).  Subclavian Artery Stenosis History of known left subclavian artery stenosis which has been treated conservatively given no symptoms. - Continues to be asymptomatic.  - No aspirin due to need for Eliquis. - Continue high-intensity statin.  Hypertension BP well controlled. - Continue Lopressor 25mg  twice daily.  Hyperlipidemia Last lipid panel in ***: - Continue Crestor 40mg  daily.  CKD Stage IV Baseline creatine was around 1.2 to 1.4 in 01/2020 but was 2.10 on recent admission. Creatinine was 1.81 on discharge and this was felt to be new baseline.  - Will repeat BMET today. ***   Past Medical History:  Diagnosis Date   Allergic rhinitis    Arthritis    Breast calcification seen on mammogram    Right breast   Breast cancer (HCC)    COPD (chronic obstructive pulmonary disease) (HCC)    GERD (gastroesophageal reflux disease)    Hyperglycemia    Hyperlipidemia    Hypertension    Low back pain    On home oxygen therapy    all the time   Osteopenia    Peripheral edema    Shortness of breath    Subclavian artery stenosis, left (HCC)    Vitamin D deficiency    Wears dentures    top   Wears glasses    reading    Past Surgical History:  Procedure Laterality Date   BREAST BIOPSY     BREAST LUMPECTOMY     right 2015   BREAST LUMPECTOMY WITH RADIOACTIVE SEED LOCALIZATION Right 12/20/2013   Procedure: RIGHT BREAST LUMPECTOMY WITH RADIOACTIVE SEED LOCALIZATION;  Surgeon: Erroll Luna, MD;  Location: [facility];  Service: General;  Laterality: Right;   CAROTID DUPLEX  2014   CATARACT EXTRACTION     both eyes    Current Medications: No outpatient medications have been marked as taking for the 03/15/21 encounter (Appointment) with Courtney Mclean, PA-C.     Allergies:   Patient has no known allergies.   Social History   Socioeconomic History   Marital status: Married    Spouse name: Courtney Cline    Number of children: 2   Years of education: Not on file   Highest education level: Not on file  Occupational History   Occupation: retired    Comment: Chartered certified accountant  Tobacco Use   Smoking status: Former    Packs/day: 1.00    Years: 40.00    Pack years: 40.00    Types: Cigarettes    Quit date: 02/14/1994    Years since quitting: 27.0   Smokeless tobacco: Never  Substance and Sexual Activity   Alcohol use: No   Drug use: No   Sexual activity: Not on file  Other Topics Concern   Not on file  Social History Narrative   Not on file   Social Determinants of Health   Financial Resource Strain: Not on file  Food Insecurity: Not on file  Transportation Needs: Not on file  Physical Activity: Not on file  Stress: Not on file  Social Connections: Not on file     Family History: The patient's family history includes Emphysema in her maternal grandfather and mother.  ROS:   Please see the history of present illness.     EKGs/Labs/Other Studies Reviewed:    The following studies were reviewed today:  Echocardiogram 02/16/2021: Impressions:  1. Left ventricular ejection fraction, by estimation, is 60 to 65%. The  left ventricle has normal function. The left ventricle has no regional  wall motion abnormalities. Left ventricular diastolic parameters are  indeterminate.   2. Right ventricular systolic function is normal. The right ventricular  size is normal.   3. The mitral valve is normal in structure. Trivial mitral valve  regurgitation. No evidence of mitral stenosis.   4. The aortic valve is calcified. Aortic valve regurgitation is not  visualized. Mild to moderate aortic valve stenosis. Aortic valve area, by  VTI measures 1.43 cm. Aortic valve mean gradient measures 13.3 mmHg.  Aortic valve Vmax measures 2.48 m/s.   5. The inferior vena cava is normal in size with greater than 50%  respiratory variability, suggesting right atrial pressure of 3 mmHg.   Comparison(s): No  prior Echocardiogram.   EKG:  EKG not ordered today.   Recent Labs: 02/15/2021: ALT 8 02/17/2021: Hemoglobin 7.9; Platelets 226 02/19/2021: BUN 44; Creatinine, Ser 1.81; Potassium 5.3; Sodium 141  Recent Lipid Panel No results found for: CHOL, TRIG, HDL, CHOLHDL, VLDL, LDLCALC, LDLDIRECT  Physical Exam:    Vital Signs: There were no vitals taken for this visit.    Wt Readings from Last 3 Encounters:  02/16/21 165 lb 9.1 oz (75.1 kg)  09/01/20 168 lb (76.2 kg)  08/31/20 170 lb (77.1 kg)     General: 88 y.o. female in no acute distress. HEENT: Normocephalic and atraumatic. Sclera clear. EOMs intact. Neck: Supple. No carotid bruits. No JVD. Heart: *** RRR. Distinct S1 and S2. No murmurs, gallops, or rubs. Radial and distal pedal pulses 2+ and equal bilaterally. Lungs: No increased work of breathing. Clear to ausculation bilaterally. No wheezes, rhonchi, or rales.  Abdomen: Soft, non-distended, and  non-tender to palpation. Bowel sounds present in all 4 quadrants.  MSK: Normal strength and tone for age. *** Extremities: No lower extremity edema.    Skin: Warm and dry. Neuro: Alert and oriented x3. No focal deficits. Psych: Normal affect. Responds appropriately.   Assessment:    No diagnosis found.  Plan:     Disposition: Follow up in ***   Medication Adjustments/Labs and Tests Ordered: Current medicines are reviewed at length with the patient today.  Concerns regarding medicines are outlined above.  No orders of the defined types were placed in this encounter.  No orders of the defined types were placed in this encounter.   There are no Patient Instructions on file for this visit.   Signed, Courtney Mclean, PA-C  03/04/2021 10:21 AM    [place]

## 2021-03-15 ENCOUNTER — Ambulatory Visit: Payer: Medicare HMO | Admitting: Student

## 2021-03-15 NOTE — Progress Notes (Deleted)
Office Visit    Patient Name: Courtney Cline Date of Encounter: 03/15/2021  Primary Care Provider:  Leeroy Cha, MD Primary Cardiologist:  Quay Burow, MD  Chief Complaint   88 year old female with a history of chronic diastolic heart failure, chronic atrial fibrillation on Eliquis, asymptomatic left subclavian artery stenosis, hypertension, hyperlipidemia, CKD stage IIIb, COPD on home O2, GERD, and arthritis who presents for follow-up related to atrial fibrillation, heart failure, and subclavian artery stenosis.  Past Medical History    Past Medical History:  Diagnosis Date   Allergic rhinitis    Arthritis    Breast calcification seen on mammogram    Right breast   Breast cancer (HCC)    COPD (chronic obstructive pulmonary disease) (HCC)    GERD (gastroesophageal reflux disease)    Hyperglycemia    Hyperlipidemia    Hypertension    Low back pain    On home oxygen therapy    all the time   Osteopenia    Peripheral edema    Shortness of breath    Subclavian artery stenosis, left (HCC)    Vitamin D deficiency    Wears dentures    top   Wears glasses    reading   Past Surgical History:  Procedure Laterality Date   BREAST BIOPSY     BREAST LUMPECTOMY     right 2015   BREAST LUMPECTOMY WITH RADIOACTIVE SEED LOCALIZATION Right 12/20/2013   Procedure: RIGHT BREAST LUMPECTOMY WITH RADIOACTIVE SEED LOCALIZATION;  Surgeon: Erroll Luna, MD;  Location: [facility];  Service: General;  Laterality: Right;   CAROTID DUPLEX  2014   CATARACT EXTRACTION     both eyes    Allergies  No Known Allergies  History of Present Illness    88 year old female with medical history including chronic diastolic heart failure, chronic atrial fibrillation on Eliquis, asymptomatic left subclavian artery stenosis, hypertension, hyperlipidemia, CKD stage IIIb, COPD on home O2, GERD, and arthritis.  She has a history of asymmetric upper extremity blood  pressures. Carotid Dopplers in 2014 showed bilateral mild carotid stenosis, <50%, proximal left subclavian artery occlusion.  ABIs in 2015 showed no evidence of lower extremity arterial occlusive disease.  Echocardiogram in 2016 showed EF 60 to 65%, grade 1 DD, mildly elevated PASP, 35 mmHg. She was last seen in the office in July 2022 and was stable from a cardiac standpoint. She did report trace lower extremity edema on oral diuretics. She was recently hospitalized from 02/15/2021-02/19/2021 in the setting of COPD exacerbation.  Chest x-ray was unremarkable for acute process. Repeat echocardiogram during hospitalization showed an EF 60 to 65%, normal LV function, aortic valve calcification, mild to moderate aortic stenosis, mean gradient 13.3 mmHg.   She presents today for follow-up.  Since her last visit  Chronic diastolic heart failure: Persistent atrial fibrillation: Carotid artery stenosis/left subclavian artery stenosis: Hypertension: Hyperlipidemia: CKD stage IIIb: COPD: Disposition:  Home Medications    Current Outpatient Medications  Medication Sig Dispense Refill   acetaminophen (TYLENOL) 325 MG tablet Take 650 mg by mouth every 6 (six) hours as needed for mild pain or headache.      apixaban (ELIQUIS) 2.5 MG TABS tablet Take 1 tablet (2.5 mg total) by mouth 2 (two) times daily. 60 tablet 0   Cholecalciferol (VITAMIN D) 2000 UNITS CAPS Take 2,000 Units by mouth daily.      Cyanocobalamin (VITAMIN B 12 PO) Take 1 tablet by mouth daily.     Fluticasone-Umeclidin-Vilant (TRELEGY ELLIPTA)  100-62.5-25 MCG/ACT AEPB Inhale 100 mcg into the lungs daily. 60 each 0   furosemide (LASIX) 40 MG tablet Take 0.5 tablets (20 mg total) by mouth 2 (two) times daily. <PLEASE MAKE APPOINTMENT FOR REFILLS> 30 tablet 0   metoprolol tartrate (LOPRESSOR) 25 MG tablet Take 1 tablet (25 mg total) by mouth 2 (two) times daily. 60 tablet 0   PROAIR HFA 108 (90 Base) MCG/ACT inhaler TAKE 2 PUFFS BY MOUTH EVERY 6  HOURS AS NEEDED FOR WHEEZE OR SHORTNESS OF BREATH (Patient taking differently: Inhale 2 puffs into the lungs every 6 (six) hours as needed for wheezing or shortness of breath.) 8.5 Inhaler 11   rosuvastatin (CRESTOR) 40 MG tablet Take 40 mg by mouth daily.     No current facility-administered medications for this visit.     Review of Systems    ***.  All other systems reviewed and are otherwise negative except as noted above.    Physical Exam    VS:  There were no vitals taken for this visit. , BMI There is no height or weight on file to calculate BMI.     GEN: Well nourished, well developed, in no acute distress. HEENT: normal. Neck: Supple, no JVD, carotid bruits, or masses. Cardiac: RRR, no murmurs, rubs, or gallops. No clubbing, cyanosis, edema.  Radials/DP/PT 2+ and equal bilaterally.  Respiratory:  Respirations regular and unlabored, clear to auscultation bilaterally. GI: Soft, nontender, nondistended, BS + x 4. MS: no deformity or atrophy. Skin: warm and dry, no rash. Neuro:  Strength and sensation are intact. Psych: Normal affect.  Accessory Clinical Findings    ECG personally reviewed by me today - *** - no acute changes.  Lab Results  Component Value Date   WBC 5.8 02/17/2021   HGB 7.9 (L) 02/17/2021   HCT 25.5 (L) 02/17/2021   MCV 95.1 02/17/2021   PLT 226 02/17/2021   Lab Results  Component Value Date   CREATININE 1.81 (H) 02/19/2021   BUN 44 (H) 02/19/2021   NA 141 02/19/2021   K 5.3 (H) 02/19/2021   CL 111 02/19/2021   CO2 24 02/19/2021   Lab Results  Component Value Date   ALT 8 02/15/2021   AST 12 (L) 02/15/2021   ALKPHOS 87 02/15/2021   BILITOT 0.9 02/15/2021   No results found for: CHOL, HDL, LDLCALC, LDLDIRECT, TRIG, CHOLHDL  No results found for: HGBA1C  Assessment & Plan    1.  ***   Lenna Sciara, NP 03/15/2021, 4:37 AM

## 2021-03-31 DIAGNOSIS — D0511 Intraductal carcinoma in situ of right breast: Secondary | ICD-10-CM | POA: Diagnosis not present

## 2021-03-31 DIAGNOSIS — I10 Essential (primary) hypertension: Secondary | ICD-10-CM | POA: Diagnosis not present

## 2021-03-31 DIAGNOSIS — G629 Polyneuropathy, unspecified: Secondary | ICD-10-CM | POA: Diagnosis not present

## 2021-03-31 DIAGNOSIS — Z0000 Encounter for general adult medical examination without abnormal findings: Secondary | ICD-10-CM | POA: Diagnosis not present

## 2021-03-31 DIAGNOSIS — E785 Hyperlipidemia, unspecified: Secondary | ICD-10-CM | POA: Diagnosis not present

## 2021-03-31 DIAGNOSIS — E559 Vitamin D deficiency, unspecified: Secondary | ICD-10-CM | POA: Diagnosis not present

## 2021-03-31 DIAGNOSIS — D638 Anemia in other chronic diseases classified elsewhere: Secondary | ICD-10-CM | POA: Diagnosis not present

## 2021-03-31 DIAGNOSIS — Z1389 Encounter for screening for other disorder: Secondary | ICD-10-CM | POA: Diagnosis not present

## 2021-03-31 DIAGNOSIS — I480 Paroxysmal atrial fibrillation: Secondary | ICD-10-CM | POA: Diagnosis not present

## 2021-03-31 DIAGNOSIS — D6869 Other thrombophilia: Secondary | ICD-10-CM | POA: Diagnosis not present

## 2021-05-03 DIAGNOSIS — I10 Essential (primary) hypertension: Secondary | ICD-10-CM | POA: Diagnosis not present

## 2021-05-03 DIAGNOSIS — J449 Chronic obstructive pulmonary disease, unspecified: Secondary | ICD-10-CM | POA: Diagnosis not present

## 2021-05-03 DIAGNOSIS — E785 Hyperlipidemia, unspecified: Secondary | ICD-10-CM | POA: Diagnosis not present

## 2021-05-06 ENCOUNTER — Other Ambulatory Visit: Payer: Self-pay

## 2021-05-06 ENCOUNTER — Ambulatory Visit: Payer: Medicare HMO | Admitting: Cardiovascular Disease

## 2021-05-06 ENCOUNTER — Encounter: Payer: Self-pay | Admitting: Cardiovascular Disease

## 2021-05-06 VITALS — BP 130/58 | HR 86 | Ht 60.0 in | Wt 156.8 lb

## 2021-05-06 DIAGNOSIS — R600 Localized edema: Secondary | ICD-10-CM

## 2021-05-06 DIAGNOSIS — I771 Stricture of artery: Secondary | ICD-10-CM

## 2021-05-06 DIAGNOSIS — E782 Mixed hyperlipidemia: Secondary | ICD-10-CM

## 2021-05-06 DIAGNOSIS — I10 Essential (primary) hypertension: Secondary | ICD-10-CM | POA: Diagnosis not present

## 2021-05-06 DIAGNOSIS — I350 Nonrheumatic aortic (valve) stenosis: Secondary | ICD-10-CM | POA: Insufficient documentation

## 2021-05-06 DIAGNOSIS — I4819 Other persistent atrial fibrillation: Secondary | ICD-10-CM

## 2021-05-06 DIAGNOSIS — I5032 Chronic diastolic (congestive) heart failure: Secondary | ICD-10-CM | POA: Diagnosis not present

## 2021-05-06 NOTE — Assessment & Plan Note (Signed)
Stable, asymptomatic. 

## 2021-05-06 NOTE — Progress Notes (Signed)
? ? ? ?05/06/2021 ?Courtney Cline   ?[birth year]  ?[id number] ? ?Primary Physician Leeroy Cha, MD ?Primary Cardiologist: Lorretta Harp MD [name], [place] ? ?HPI:  Courtney Cline is a 88 y.o.  married Caucasian female mother of 2, grandmother 4 grandchildren who is accompanied by her son Courtney Cline today.  She was initially referred to me by Dr. Antony Blackbird for peripheral vascular evaluation because of presumed left subclavian artery stenosis and/or steel. I last saw her in the office 09/01/2020. Her cardiac risk factor profile is notable for remote tobacco abuse having quit 22 years ago, treated hypertension, and hyperlipidemia. She has never had a heart attack or stroke. She was a symptomatically with regards to left upper extremity claudication or dizziness and therefore continue conservative treatment of her subclavian artery stenosis was recommended at that time. She has noticed increasing bilateral lower extremity edema over the last one month which has improved somewhat with the addition of an oral diuretic. She does admit to dietary indiscretion. She is on chronic home O2 for her COPD and has not noticed any increasing shortness of breath. ?  ?Since I saw her a year ago she remained stable.  She does have trace lower extreme edema on oral diuretics.  She is on chronic O2 for COPD.  She currently is in sinus rhythm today on Eliquis oral anticoagulation.  She denies chest pain. ?  ? ? ?Current Meds  ?Medication Sig  ? acetaminophen (TYLENOL) 325 MG tablet Take 650 mg by mouth every 6 (six) hours as needed for mild pain or headache.   ? apixaban (ELIQUIS) 2.5 MG TABS tablet Take 1 tablet (2.5 mg total) by mouth 2 (two) times daily.  ? Cholecalciferol (VITAMIN D) 2000 UNITS CAPS Take 2,000 Units by mouth daily.   ? Cyanocobalamin (VITAMIN B 12 PO) Take 1 tablet by mouth daily.  ? Fluticasone-Umeclidin-Vilant (TRELEGY ELLIPTA) 100-62.5-25 MCG/ACT AEPB Inhale 100 mcg into the lungs  daily.  ? furosemide (LASIX) 40 MG tablet Take 0.5 tablets (20 mg total) by mouth 2 (two) times daily. <PLEASE MAKE APPOINTMENT FOR REFILLS>  ? metoprolol tartrate (LOPRESSOR) 25 MG tablet Take 1 tablet (25 mg total) by mouth 2 (two) times daily.  ? PROAIR HFA 108 (90 Base) MCG/ACT inhaler TAKE 2 PUFFS BY MOUTH EVERY 6 HOURS AS NEEDED FOR WHEEZE OR SHORTNESS OF BREATH (Patient taking differently: Inhale 2 puffs into the lungs every 6 (six) hours as needed for wheezing or shortness of breath.)  ? rosuvastatin (CRESTOR) 40 MG tablet Take 40 mg by mouth daily.  ?  ? ?No Known Allergies ? ?Social History  ? ?Socioeconomic History  ? Marital status: Married  ?  Spouse name: Courtney Cline  ? Number of children: 2  ? Years of education: Not on file  ? Highest education level: Not on file  ?Occupational History  ? Occupation: retired  ?  Comment: House Keeper  ?Tobacco Use  ? Smoking status: Former  ?  Packs/day: 1.00  ?  Years: 40.00  ?  Pack years: 40.00  ?  Types: Cigarettes  ?  Quit date: 02/14/1994  ?  Years since quitting: 27.2  ? Smokeless tobacco: Never  ?Substance and Sexual Activity  ? Alcohol use: No  ? Drug use: No  ? Sexual activity: Not on file  ?Other Topics Concern  ? Not on file  ?Social History Narrative  ? Not on file  ? ?Social Determinants of Health  ? ?Financial Resource Strain: Not  on file  ?Food Insecurity: Not on file  ?Transportation Needs: Not on file  ?Physical Activity: Not on file  ?Stress: Not on file  ?Social Connections: Not on file  ?Intimate Partner Violence: Not on file  ?  ? ?Review of Systems: ?General: negative for chills, fever, night sweats or weight changes.  ?Cardiovascular: negative for chest pain, dyspnea on exertion, edema, orthopnea, palpitations, paroxysmal nocturnal dyspnea or shortness of breath ?Dermatological: negative for rash ?Respiratory: negative for cough or wheezing ?Urologic: negative for hematuria ?Abdominal: negative for nausea, vomiting, diarrhea, bright red blood per  rectum, melena, or hematemesis ?Neurologic: negative for visual changes, syncope, or dizziness ?All other systems reviewed and are otherwise negative except as noted above. ? ? ? ?Blood pressure (!) 130/58, pulse 86, height 5' (1.524 m), weight 156 lb 12.8 oz (71.1 kg), SpO2 99 %.  ?General appearance: alert and no distress ?Neck: no adenopathy, no carotid bruit, no JVD, supple, symmetrical, trachea midline, and thyroid not enlarged, symmetric, no tenderness/mass/nodules ?Lungs: clear to auscultation bilaterally ?Heart: regular rate and rhythm, S1, S2 normal, no murmur, click, rub or gallop ?Extremities: extremities normal, atraumatic, no cyanosis or edema ?Pulses: 2+ and symmetric ?Skin: Skin color, texture, turgor normal. No rashes or lesions ?Neurologic: Grossly normal ? ?EKG sinus rhythm 86 without ST or T wave changes.  Personally reviewed this EKG. ? ?ASSESSMENT AND PLAN:  ? ?Subclavian artery stenosis, left (HCC) ?Stable, asymptomatic ? ?Bilateral lower extremity edema ?Currently she has no edema on oral diuretics ? ?Essential hypertension ?History of essential hypertension blood pressure measured at 130/58.  She is on metoprolol. ? ?Hyperlipidemia ?History of hyperlipidemia on statin therapy with lipid profile performed 03/31/2021 revealing total cholesterol of 152,, LDL 71 and HDL 64. ? ?Persistent atrial fibrillation ([place]) ?History of PAF maintaining sinus rhythm on Eliquis oral anticoagulation. ? ?Diastolic CHF ([place]) ?Chronic diastolic heart failure on oral diuretics ? ?Aortic stenosis ?History of mild to moderate aortic stenosis by 2D echo recently performed 02/16/2021 with a valve area of 1.43 cm? and a mean gradient of 13 mmHg. ? ? ? ? ?Lorretta Harp MD FACP,FACC,FAHA, FSCAI ?05/06/2021 ?3:57 PM ?

## 2021-05-06 NOTE — Assessment & Plan Note (Signed)
History of mild to moderate aortic stenosis by 2D echo recently performed 02/16/2021 with a valve area of 1.43 cm? and a mean gradient of 13 mmHg. ?

## 2021-05-06 NOTE — Assessment & Plan Note (Signed)
History of essential hypertension blood pressure measured at 130/58.  She is on metoprolol. ?

## 2021-05-06 NOTE — Patient Instructions (Signed)

## 2021-05-06 NOTE — Assessment & Plan Note (Addendum)
History of hyperlipidemia on statin therapy with lipid profile performed 03/31/2021 revealing total cholesterol of 152,, LDL 71 and HDL 64. 

## 2021-05-06 NOTE — Assessment & Plan Note (Signed)
Chronic diastolic heart failure on oral diuretics ?

## 2021-05-06 NOTE — Assessment & Plan Note (Signed)
Currently she has no edema on oral diuretics ?

## 2021-05-06 NOTE — Assessment & Plan Note (Signed)
History of PAF maintaining sinus rhythm on Eliquis oral anticoagulation. 

## 2021-05-10 ENCOUNTER — Ambulatory Visit: Payer: Medicare HMO | Admitting: General Practice

## 2021-05-31 ENCOUNTER — Encounter: Payer: Self-pay | Admitting: Podiatry

## 2021-05-31 ENCOUNTER — Ambulatory Visit: Payer: Medicare HMO | Admitting: Podiatry

## 2021-05-31 DIAGNOSIS — B351 Tinea unguium: Secondary | ICD-10-CM

## 2021-05-31 DIAGNOSIS — M79675 Pain in left toe(s): Secondary | ICD-10-CM

## 2021-05-31 DIAGNOSIS — M79674 Pain in right toe(s): Secondary | ICD-10-CM

## 2021-05-31 NOTE — Progress Notes (Signed)
Subjective:  ? ?Patient ID: Courtney Cline, female   DOB: 88 y.o.   MRN: [id number]  ? ?HPI ?Patient presents stating all nails are bothering her making it hard for her to wear shoe gear comfortably and states that they become very thick and elongated and presents with caregiver ? ? ?ROS ? ? ?   ?Objective:  ?Physical Exam  ?Neurovascular status intact with thick yellow brittle nailbeds 1-5 both feet dystrophic painful when pressed ? ?   ?Assessment:  ?Chronic mycotic nail infection with pain 1-5 both feet ? ?   ?Plan:  ?Debrided painful nailbeds 1-5 both feet no iatrogenic bleeding reappoint routine care ?   ? ? ?

## 2021-06-04 DIAGNOSIS — D509 Iron deficiency anemia, unspecified: Secondary | ICD-10-CM | POA: Diagnosis not present

## 2021-06-04 DIAGNOSIS — I5032 Chronic diastolic (congestive) heart failure: Secondary | ICD-10-CM | POA: Diagnosis not present

## 2021-06-04 DIAGNOSIS — I480 Paroxysmal atrial fibrillation: Secondary | ICD-10-CM | POA: Diagnosis not present

## 2021-06-04 DIAGNOSIS — R040 Epistaxis: Secondary | ICD-10-CM | POA: Diagnosis not present

## 2021-06-08 ENCOUNTER — Telehealth: Payer: Self-pay | Admitting: Hematology

## 2021-06-08 NOTE — Telephone Encounter (Signed)
Scheduled appt per 4/24 referral. Pt was already established with Dr. Burr Medico. Pt is aware of appt date and time. Pt is aware to arrive 15 mins prior to appt time and to bring and updated insurance card. Pt is aware of appt location.   ?

## 2021-06-24 ENCOUNTER — Other Ambulatory Visit: Payer: Self-pay

## 2021-06-24 ENCOUNTER — Encounter: Payer: Self-pay | Admitting: Hematology

## 2021-06-24 ENCOUNTER — Inpatient Hospital Stay: Payer: Medicare HMO | Attending: Hematology | Admitting: Hematology

## 2021-06-24 ENCOUNTER — Inpatient Hospital Stay: Payer: Medicare HMO

## 2021-06-24 VITALS — BP 155/56 | HR 96 | Temp 98.3°F | Resp 20 | Ht 60.0 in | Wt 154.3 lb

## 2021-06-24 DIAGNOSIS — C50211 Malignant neoplasm of upper-inner quadrant of right female breast: Secondary | ICD-10-CM | POA: Diagnosis not present

## 2021-06-24 DIAGNOSIS — Z170 Estrogen receptor positive status [ER+]: Secondary | ICD-10-CM | POA: Diagnosis not present

## 2021-06-24 DIAGNOSIS — I4891 Unspecified atrial fibrillation: Secondary | ICD-10-CM | POA: Diagnosis not present

## 2021-06-24 DIAGNOSIS — J449 Chronic obstructive pulmonary disease, unspecified: Secondary | ICD-10-CM | POA: Insufficient documentation

## 2021-06-24 DIAGNOSIS — Z7901 Long term (current) use of anticoagulants: Secondary | ICD-10-CM | POA: Diagnosis not present

## 2021-06-24 DIAGNOSIS — E611 Iron deficiency: Secondary | ICD-10-CM | POA: Insufficient documentation

## 2021-06-24 DIAGNOSIS — D500 Iron deficiency anemia secondary to blood loss (chronic): Secondary | ICD-10-CM

## 2021-06-24 DIAGNOSIS — Z79899 Other long term (current) drug therapy: Secondary | ICD-10-CM | POA: Diagnosis not present

## 2021-06-24 DIAGNOSIS — Z853 Personal history of malignant neoplasm of breast: Secondary | ICD-10-CM | POA: Insufficient documentation

## 2021-06-24 DIAGNOSIS — D631 Anemia in chronic kidney disease: Secondary | ICD-10-CM | POA: Diagnosis not present

## 2021-06-24 DIAGNOSIS — N184 Chronic kidney disease, stage 4 (severe): Secondary | ICD-10-CM | POA: Insufficient documentation

## 2021-06-24 DIAGNOSIS — M8580 Other specified disorders of bone density and structure, unspecified site: Secondary | ICD-10-CM | POA: Insufficient documentation

## 2021-06-24 LAB — CBC WITH DIFFERENTIAL/PLATELET
Abs Immature Granulocytes: 0.03 10*3/uL (ref 0.00–0.07)
Basophils Absolute: 0 10*3/uL (ref 0.0–0.1)
Basophils Relative: 0 %
Eosinophils Absolute: 0.1 10*3/uL (ref 0.0–0.5)
Eosinophils Relative: 1 %
HCT: 24.3 % — ABNORMAL LOW (ref 36.0–46.0)
Hemoglobin: 7.5 g/dL — ABNORMAL LOW (ref 12.0–15.0)
Immature Granulocytes: 0 %
Lymphocytes Relative: 12 %
Lymphs Abs: 1 10*3/uL (ref 0.7–4.0)
MCH: 29.1 pg (ref 26.0–34.0)
MCHC: 30.9 g/dL (ref 30.0–36.0)
MCV: 94.2 fL (ref 80.0–100.0)
Monocytes Absolute: 0.5 10*3/uL (ref 0.1–1.0)
Monocytes Relative: 6 %
Neutro Abs: 6.8 10*3/uL (ref 1.7–7.7)
Neutrophils Relative %: 81 %
Platelets: 250 10*3/uL (ref 150–400)
RBC: 2.58 MIL/uL — ABNORMAL LOW (ref 3.87–5.11)
RDW: 13.7 % (ref 11.5–15.5)
WBC: 8.5 10*3/uL (ref 4.0–10.5)
nRBC: 0 % (ref 0.0–0.2)

## 2021-06-24 LAB — IRON AND IRON BINDING CAPACITY (CC-WL,HP ONLY)
Iron: 27 ug/dL — ABNORMAL LOW (ref 28–170)
Saturation Ratios: 8 % — ABNORMAL LOW (ref 10.4–31.8)
TIBC: 333 ug/dL (ref 250–450)
UIBC: 306 ug/dL (ref 148–442)

## 2021-06-24 LAB — COMPREHENSIVE METABOLIC PANEL
ALT: 9 U/L (ref 0–44)
AST: 14 U/L — ABNORMAL LOW (ref 15–41)
Albumin: 3.6 g/dL (ref 3.5–5.0)
Alkaline Phosphatase: 57 U/L (ref 38–126)
Anion gap: 7 (ref 5–15)
BUN: 28 mg/dL — ABNORMAL HIGH (ref 8–23)
CO2: 32 mmol/L (ref 22–32)
Calcium: 9.1 mg/dL (ref 8.9–10.3)
Chloride: 102 mmol/L (ref 98–111)
Creatinine, Ser: 1.43 mg/dL — ABNORMAL HIGH (ref 0.44–1.00)
GFR, Estimated: 36 mL/min — ABNORMAL LOW (ref >60)
Glucose, Bld: 101 mg/dL — ABNORMAL HIGH (ref 70–99)
Potassium: 4.3 mmol/L (ref 3.5–5.1)
Sodium: 141 mmol/L (ref 135–145)
Total Bilirubin: 0.3 mg/dL (ref 0.3–1.2)
Total Protein: 7.1 g/dL (ref 6.5–8.1)

## 2021-06-24 LAB — FERRITIN: Ferritin: 30 ng/mL (ref 11–307)

## 2021-06-24 NOTE — Progress Notes (Addendum)
[facility]   Telephone:(336) [phone] Fax:(336) [phone]   Clinic Follow up Note   Patient Care Team: Lorenda Ishihara, MD as PCP - General (Internal Medicine) Runell Gess, MD as PCP - Cardiology (Cardiology) Lupita Leash, MD as Consulting Physician (Pulmonary Disease) Malachy Mood, MD as Consulting Physician (Hematology) Harriette Bouillon, MD as Consulting Physician (General Surgery)  Date of Service:  06/24/2021  CHIEF COMPLAINT: f/u of anemia, h/o breast cancer  CURRENT THERAPY:  PENDING Injection  ASSESSMENT & PLAN:  Courtney Cline is a 88 y.o. female with   1. Normocytic anemia secondary to iron deficiency and chronic kidney disease -long standing history of low hemoglobin secondary to CKD -she is on Eliquis for A.fib -endorses taking oral iron for ~1 year -prior stool occult test was negative. -she recently presented to her PCP with nosebleeds. She was referred to ENT but has not heard back from them yet. -Her recent ferritin and iron level were low, supporting iron deficiency, likely from nose or GI bleeding, probably related to anticoagulation. -She also has anemia of chronic disease secondary to CKD -we will obtain new baseline labs today.  If her ferritin less than 100, I recommend IV iron.  I also recommend ESA injection to help boost her blood counts.  Potential benefit and side effects discussed with her, especially small risk of thrombosis. we will work to [employer] approval and move forward as soon as possible. -We also reviewed as a possibility of anemia, given his longstanding history of anemia, the possibility of MDS or other primary bone marrow disease is low, will hold on bone marrow biopsy for now due to her advanced age.   2. CKD, stage IV -creatinine has been elevated since at least 2018 -I advised her to follow with her PCP  3. A.fib, COPD -she is oxygen-dependent since ~2015 -she is on Eliquis -followed by  Drs. McQuaid and Berry  4. H/o Stage IA Right Breast Cancer, ER+/PR+/Her2- -diagnosed in 10/2013, s/p right lumpectomy. Completed 5 years of anastrozole in 11/2018.   PLAN: -lab today -lab and Retacrit injection in 1, 3, and 5 weeks -IV Venofer if her ferritin less than 100, on different day of retacrit  -f/u in 5 weeks   No problem-specific Assessment & Plan notes found for this encounter.   SUMMARY OF ONCOLOGIC HISTORY: Oncology History Overview Note  Breast cancer of upper-inner quadrant of right female breast   Staging form: Breast, AJCC 7th Edition     Clinical: Stage Unknown (T1c, NX, cM0) - Signed by Renaye Rakers, MD on 11/19/2013       Staging comments: ER 100%+, PR 100%+, Ki-67 7%       Breast cancer of upper-inner quadrant of right female breast (HCC)  12/06/2012 Imaging   Bone Density performed at [facility] physicians T score Lumbar spine -0.1 (-0.7 in 2012), Right neck femur -1.4 (-1.2 before), Left neck femur -1.4 (-1.5 before) and Interpretation is OSTEOPENIA by WHO criteria.     10/11/2013 Mammogram   Abnormal Screening mammogram showing Right Breast mass.    10/23/2013 Breast US   Diagnostic mammogram and US showed an irregular hypoechoic mass at 9'0 clock position right breast 8 cm from nipple. [place] measurement 1.2x0.7x1.1 cm, no axillary adenopathy.    10/23/2013 Initial Biopsy   US guided biopsy  done with clip placement.    10/23/2013 Pathology Results   Invasive ductal carcinoma (IDC), DCIS, invasive cancer grade 2, ER 100%+, PR 100%+, Ki-67% 7%,  HER-2/NEU by CISH No amplification, ratio of her2:cep17 1.00, average her2 copy number per cell 1.95 (HER 2 Negative tumor). Molecular Classification LUMINAL A.    11/01/2013 Surgery   Initial surgical evaluation by Dr Maisie Fus Cornett: "Not good surgical candidate because of pulmonary status". Recommended anti-estrogen therapy.    11/18/2013 -  Anti-estrogen oral therapy   Anastrozole 1 mg once daily     12/20/2013 Surgery   right breast lumpectomy without SLN biopsy, negative margins     12/20/2013 Pathologic Stage   pT1cNxMx, G2, LVI (-), tumor measures 1.2cm. Surgical margins are negative.      10/18/2016 Imaging   MM DIAG BREAST TOMO BILATERAL 10/18/16 IMPRESSION: No mammographic evidence of malignancy involving either breast. Expected post lumpectomy changes in the right breast.   11/08/2018 Mammogram   IMPRESSION: No evidence of malignancy in either breast. Lumpectomy changes on the right. RECOMMENDATION: Diagnostic mammogram is suggested in 1 year. (Code:DM-B-01Y)      INTERVAL HISTORY:  Courtney Cline is here for a follow up of anemia. She was last seen by NP Lacie in 12/2018. She presents to the clinic accompanied by her son. She reports her nosebleeds have been intermittent over the last several years. She reports she had one today that lasted for about 45 minutes, and the last one before this was about 2 weeks ago. She notes black stool secondary to the oral iron she takes. She denies any bowel habit changes or weight loss.   All other systems were reviewed with the patient and are negative.  MEDICAL HISTORY:  Past Medical History:  Diagnosis Date   Allergic rhinitis    Arthritis    Breast calcification seen on mammogram    Right breast   Breast cancer (HCC)    COPD (chronic obstructive pulmonary disease) (HCC)    GERD (gastroesophageal reflux disease)    Hyperglycemia    Hyperlipidemia    Hypertension    Low back pain    On home oxygen therapy    all the time   Osteopenia    Peripheral edema    Shortness of breath    Subclavian artery stenosis, left (HCC)    Vitamin D deficiency    Wears dentures    top   Wears glasses    reading    SURGICAL HISTORY: Past Surgical History:  Procedure Laterality Date   BREAST BIOPSY     BREAST LUMPECTOMY     right 2015   BREAST LUMPECTOMY WITH RADIOACTIVE SEED LOCALIZATION Right 12/20/2013   Procedure:  RIGHT BREAST LUMPECTOMY WITH RADIOACTIVE SEED LOCALIZATION;  Surgeon: Harriette Bouillon, MD;  Location: [facility];  Service: General;  Laterality: Right;   CAROTID DUPLEX  2014   CATARACT EXTRACTION     both eyes    I have reviewed the social history and family history with the patient and they are unchanged from previous note.  ALLERGIES:  has No Known Allergies.  MEDICATIONS:  Current Outpatient Medications  Medication Sig Dispense Refill   acetaminophen (TYLENOL) 325 MG tablet Take 650 mg by mouth every 6 (six) hours as needed for mild pain or headache.      apixaban (ELIQUIS) 2.5 MG TABS tablet Take 1 tablet (2.5 mg total) by mouth 2 (two) times daily. 60 tablet 0   Cholecalciferol (VITAMIN D) 2000 UNITS CAPS Take 2,000 Units by mouth daily.      Cyanocobalamin (VITAMIN B 12 PO) Take 1 tablet by mouth daily.     Fluticasone-Umeclidin-Vilant (  TRELEGY ELLIPTA) 100-62.5-25 MCG/ACT AEPB Inhale 100 mcg into the lungs daily. 60 each 0   furosemide (LASIX) 40 MG tablet Take 0.5 tablets (20 mg total) by mouth 2 (two) times daily. <PLEASE MAKE APPOINTMENT FOR REFILLS> 30 tablet 0   metoprolol tartrate (LOPRESSOR) 25 MG tablet Take 1 tablet (25 mg total) by mouth 2 (two) times daily. 60 tablet 0   PROAIR HFA 108 (90 Base) MCG/ACT inhaler TAKE 2 PUFFS BY MOUTH EVERY 6 HOURS AS NEEDED FOR WHEEZE OR SHORTNESS OF BREATH (Patient taking differently: Inhale 2 puffs into the lungs every 6 (six) hours as needed for wheezing or shortness of breath.) 8.5 Inhaler 11   rosuvastatin (CRESTOR) 40 MG tablet Take 40 mg by mouth daily.     No current facility-administered medications for this visit.    PHYSICAL EXAMINATION: ECOG PERFORMANCE STATUS: 3 - Symptomatic, >50% confined to bed  Vitals:   06/24/21 1330  BP: (!) 155/56  Pulse: 96  Resp: 20  Temp: 98.3 F (36.8 C)  SpO2: 99%   Wt Readings from Last 3 Encounters:  06/24/21 154 lb 4.8 oz (70 kg)  05/06/21 156 lb 12.8 oz (71.1 kg)   02/16/21 165 lb 9.1 oz (75.1 kg)     GENERAL:alert, no distress and comfortable SKIN: skin color, texture, turgor are normal, no rashes or significant lesions EYES: normal, Conjunctiva are pink and non-injected, sclera clear  LUNGS: clear to auscultation and percussion with normal breathing effort HEART: regular rate & rhythm and no murmurs and no lower extremity edema NEURO: alert & oriented x 3 with fluent speech, no focal motor/sensory deficits  LABORATORY DATA:  I have reviewed the data as listed    Latest Ref Rng & Units 06/24/2021    2:08 PM 02/17/2021    2:12 AM 02/16/2021    1:44 PM  CBC  WBC 4.0 - 10.5 K/uL 8.5   5.8     Hemoglobin 12.0 - 15.0 g/dL 7.5   7.9   8.5    Hematocrit 36.0 - 46.0 % 24.3   25.5   27.7    Platelets 150 - 400 K/uL 250   226           Latest Ref Rng & Units 06/24/2021    2:08 PM 02/19/2021    1:10 AM 02/18/2021   12:27 PM  CMP  Glucose 70 - 99 mg/dL 644   034   742    BUN 8 - 23 mg/dL 28   44   41    Creatinine 0.44 - 1.00 mg/dL 5.95   6.38   7.56    Sodium 135 - 145 mmol/L 141   141   139    Potassium 3.5 - 5.1 mmol/L 4.3   5.3   4.7    Chloride 98 - 111 mmol/L 102   111   111    CO2 22 - 32 mmol/L 32   24   22    Calcium 8.9 - 10.3 mg/dL 9.1   9.1   9.1    Total Protein 6.5 - 8.1 g/dL 7.1      Total Bilirubin 0.3 - 1.2 mg/dL 0.3      Alkaline Phos 38 - 126 U/L 57      AST 15 - 41 U/L 14      ALT 0 - 44 U/L 9          RADIOGRAPHIC STUDIES: I have personally reviewed the radiological images as listed and agreed  with the findings in the report. No results found.    Orders Placed This Encounter  Procedures   CBC with Differential/Platelet    Standing Status:   Standing    Number of Occurrences:   50    Standing Expiration Date:   06/25/2022   Comprehensive metabolic panel    Standing Status:   Standing    Number of Occurrences:   10    Standing Expiration Date:   06/25/2022   Ferritin    Standing Status:   Standing    Number of  Occurrences:   20    Standing Expiration Date:   06/25/2022   Iron and Iron Binding Capacity ([facility] only)    Standing Status:   Standing    Number of Occurrences:   3    Standing Expiration Date:   06/25/2022   All questions were answered. The patient knows to call the clinic with any problems, questions or concerns. No barriers to learning was detected. The total time spent in the appointment was 40 minutes.     Malachy Mood, MD 06/24/2021   I, Mickie Bail, am acting as scribe for Malachy Mood, MD.   I have reviewed the above documentation for accuracy and completeness, and I agree with the above.

## 2021-07-01 ENCOUNTER — Inpatient Hospital Stay: Payer: Medicare HMO

## 2021-07-01 ENCOUNTER — Other Ambulatory Visit: Payer: Self-pay

## 2021-07-01 VITALS — BP 157/59 | HR 82 | Temp 97.7°F | Resp 20

## 2021-07-01 DIAGNOSIS — Z7901 Long term (current) use of anticoagulants: Secondary | ICD-10-CM | POA: Diagnosis not present

## 2021-07-01 DIAGNOSIS — E611 Iron deficiency: Secondary | ICD-10-CM | POA: Diagnosis not present

## 2021-07-01 DIAGNOSIS — D638 Anemia in other chronic diseases classified elsewhere: Secondary | ICD-10-CM

## 2021-07-01 DIAGNOSIS — K219 Gastro-esophageal reflux disease without esophagitis: Secondary | ICD-10-CM | POA: Diagnosis not present

## 2021-07-01 DIAGNOSIS — I480 Paroxysmal atrial fibrillation: Secondary | ICD-10-CM | POA: Diagnosis not present

## 2021-07-01 DIAGNOSIS — M8580 Other specified disorders of bone density and structure, unspecified site: Secondary | ICD-10-CM | POA: Diagnosis not present

## 2021-07-01 DIAGNOSIS — Z79899 Other long term (current) drug therapy: Secondary | ICD-10-CM | POA: Diagnosis not present

## 2021-07-01 DIAGNOSIS — J449 Chronic obstructive pulmonary disease, unspecified: Secondary | ICD-10-CM | POA: Diagnosis not present

## 2021-07-01 DIAGNOSIS — Z853 Personal history of malignant neoplasm of breast: Secondary | ICD-10-CM | POA: Diagnosis not present

## 2021-07-01 DIAGNOSIS — I10 Essential (primary) hypertension: Secondary | ICD-10-CM | POA: Diagnosis not present

## 2021-07-01 DIAGNOSIS — I5032 Chronic diastolic (congestive) heart failure: Secondary | ICD-10-CM | POA: Diagnosis not present

## 2021-07-01 DIAGNOSIS — D500 Iron deficiency anemia secondary to blood loss (chronic): Secondary | ICD-10-CM

## 2021-07-01 DIAGNOSIS — N184 Chronic kidney disease, stage 4 (severe): Secondary | ICD-10-CM | POA: Diagnosis not present

## 2021-07-01 DIAGNOSIS — E785 Hyperlipidemia, unspecified: Secondary | ICD-10-CM | POA: Diagnosis not present

## 2021-07-01 DIAGNOSIS — D631 Anemia in chronic kidney disease: Secondary | ICD-10-CM | POA: Diagnosis not present

## 2021-07-01 DIAGNOSIS — I4891 Unspecified atrial fibrillation: Secondary | ICD-10-CM | POA: Diagnosis not present

## 2021-07-01 LAB — CBC WITH DIFFERENTIAL/PLATELET
Abs Immature Granulocytes: 0.04 10*3/uL (ref 0.00–0.07)
Basophils Absolute: 0 10*3/uL (ref 0.0–0.1)
Basophils Relative: 0 %
Eosinophils Absolute: 0.2 10*3/uL (ref 0.0–0.5)
Eosinophils Relative: 2 %
HCT: 26 % — ABNORMAL LOW (ref 36.0–46.0)
Hemoglobin: 7.9 g/dL — ABNORMAL LOW (ref 12.0–15.0)
Immature Granulocytes: 1 %
Lymphocytes Relative: 18 %
Lymphs Abs: 1.1 10*3/uL (ref 0.7–4.0)
MCH: 28.9 pg (ref 26.0–34.0)
MCHC: 30.4 g/dL (ref 30.0–36.0)
MCV: 95.2 fL (ref 80.0–100.0)
Monocytes Absolute: 0.4 10*3/uL (ref 0.1–1.0)
Monocytes Relative: 6 %
Neutro Abs: 4.5 10*3/uL (ref 1.7–7.7)
Neutrophils Relative %: 73 %
Platelets: 206 10*3/uL (ref 150–400)
RBC: 2.73 MIL/uL — ABNORMAL LOW (ref 3.87–5.11)
RDW: 14.1 % (ref 11.5–15.5)
WBC: 6.2 10*3/uL (ref 4.0–10.5)
nRBC: 0 % (ref 0.0–0.2)

## 2021-07-01 LAB — FERRITIN: Ferritin: 23 ng/mL (ref 11–307)

## 2021-07-01 MED ORDER — EPOETIN ALFA-EPBX 10000 UNIT/ML IJ SOLN
10000.0000 [IU] | Freq: Once | INTRAMUSCULAR | Status: AC
  Administered 2021-07-01: 10000 [IU] via SUBCUTANEOUS
  Filled 2021-07-01: qty 1

## 2021-07-02 ENCOUNTER — Telehealth: Payer: Self-pay | Admitting: Hematology

## 2021-07-02 NOTE — Telephone Encounter (Signed)
.  Called patient to schedule appointment per 5/12 inbasket, patient is aware of date and time.   ?

## 2021-07-04 ENCOUNTER — Other Ambulatory Visit: Payer: Self-pay | Admitting: Hematology

## 2021-07-05 ENCOUNTER — Telehealth: Payer: Self-pay

## 2021-07-05 DIAGNOSIS — J3489 Other specified disorders of nose and nasal sinuses: Secondary | ICD-10-CM | POA: Diagnosis not present

## 2021-07-05 DIAGNOSIS — Z7901 Long term (current) use of anticoagulants: Secondary | ICD-10-CM | POA: Diagnosis not present

## 2021-07-05 DIAGNOSIS — R040 Epistaxis: Secondary | ICD-10-CM | POA: Diagnosis not present

## 2021-07-05 NOTE — Telephone Encounter (Signed)
This nurse reached out to this patient related to iron levels and orders being placed for IV Venofer.  No answer and unable to leave a message.  No further concerns at this time.

## 2021-07-08 ENCOUNTER — Inpatient Hospital Stay: Payer: Medicare HMO

## 2021-07-08 ENCOUNTER — Other Ambulatory Visit: Payer: Self-pay

## 2021-07-08 VITALS — BP 147/51 | HR 81 | Temp 98.3°F | Resp 18

## 2021-07-08 DIAGNOSIS — N184 Chronic kidney disease, stage 4 (severe): Secondary | ICD-10-CM | POA: Diagnosis not present

## 2021-07-08 DIAGNOSIS — E611 Iron deficiency: Secondary | ICD-10-CM | POA: Diagnosis not present

## 2021-07-08 DIAGNOSIS — Z79899 Other long term (current) drug therapy: Secondary | ICD-10-CM | POA: Diagnosis not present

## 2021-07-08 DIAGNOSIS — J449 Chronic obstructive pulmonary disease, unspecified: Secondary | ICD-10-CM | POA: Diagnosis not present

## 2021-07-08 DIAGNOSIS — M8580 Other specified disorders of bone density and structure, unspecified site: Secondary | ICD-10-CM | POA: Diagnosis not present

## 2021-07-08 DIAGNOSIS — D638 Anemia in other chronic diseases classified elsewhere: Secondary | ICD-10-CM

## 2021-07-08 DIAGNOSIS — I4891 Unspecified atrial fibrillation: Secondary | ICD-10-CM | POA: Diagnosis not present

## 2021-07-08 DIAGNOSIS — D631 Anemia in chronic kidney disease: Secondary | ICD-10-CM | POA: Diagnosis not present

## 2021-07-08 DIAGNOSIS — Z7901 Long term (current) use of anticoagulants: Secondary | ICD-10-CM | POA: Diagnosis not present

## 2021-07-08 DIAGNOSIS — Z853 Personal history of malignant neoplasm of breast: Secondary | ICD-10-CM | POA: Diagnosis not present

## 2021-07-08 MED ORDER — SODIUM CHLORIDE 0.9 % IV SOLN
300.0000 mg | Freq: Once | INTRAVENOUS | Status: AC
  Administered 2021-07-08: 300 mg via INTRAVENOUS
  Filled 2021-07-08: qty 300

## 2021-07-08 MED ORDER — SODIUM CHLORIDE 0.9 % IV SOLN: Freq: Once | INTRAVENOUS | Status: AC

## 2021-07-08 NOTE — Patient Instructions (Signed)

## 2021-07-08 NOTE — Progress Notes (Signed)
Patient tolerated venofer infusion well. Monitored for 30 minute post observation period without incident. WSS, wheeled to lobby with O2 intact.

## 2021-07-13 ENCOUNTER — Telehealth: Payer: Self-pay | Admitting: Hematology

## 2021-07-13 NOTE — Telephone Encounter (Signed)
Scheduled per 5/26 in basket, pt has been called and confirmed

## 2021-07-15 ENCOUNTER — Inpatient Hospital Stay: Payer: Medicare HMO | Attending: Hematology

## 2021-07-15 ENCOUNTER — Other Ambulatory Visit: Payer: Self-pay

## 2021-07-15 ENCOUNTER — Inpatient Hospital Stay: Payer: Medicare HMO

## 2021-07-15 VITALS — BP 141/54 | HR 82 | Resp 18

## 2021-07-15 DIAGNOSIS — Z79899 Other long term (current) drug therapy: Secondary | ICD-10-CM | POA: Diagnosis not present

## 2021-07-15 DIAGNOSIS — E785 Hyperlipidemia, unspecified: Secondary | ICD-10-CM | POA: Insufficient documentation

## 2021-07-15 DIAGNOSIS — N184 Chronic kidney disease, stage 4 (severe): Secondary | ICD-10-CM | POA: Insufficient documentation

## 2021-07-15 DIAGNOSIS — Z7901 Long term (current) use of anticoagulants: Secondary | ICD-10-CM | POA: Diagnosis not present

## 2021-07-15 DIAGNOSIS — Z853 Personal history of malignant neoplasm of breast: Secondary | ICD-10-CM | POA: Diagnosis not present

## 2021-07-15 DIAGNOSIS — I4891 Unspecified atrial fibrillation: Secondary | ICD-10-CM | POA: Insufficient documentation

## 2021-07-15 DIAGNOSIS — J449 Chronic obstructive pulmonary disease, unspecified: Secondary | ICD-10-CM | POA: Diagnosis not present

## 2021-07-15 DIAGNOSIS — I129 Hypertensive chronic kidney disease with stage 1 through stage 4 chronic kidney disease, or unspecified chronic kidney disease: Secondary | ICD-10-CM | POA: Diagnosis not present

## 2021-07-15 DIAGNOSIS — Z9981 Dependence on supplemental oxygen: Secondary | ICD-10-CM | POA: Diagnosis not present

## 2021-07-15 DIAGNOSIS — M8589 Other specified disorders of bone density and structure, multiple sites: Secondary | ICD-10-CM | POA: Insufficient documentation

## 2021-07-15 DIAGNOSIS — E611 Iron deficiency: Secondary | ICD-10-CM | POA: Insufficient documentation

## 2021-07-15 DIAGNOSIS — D638 Anemia in other chronic diseases classified elsewhere: Secondary | ICD-10-CM

## 2021-07-15 DIAGNOSIS — D631 Anemia in chronic kidney disease: Secondary | ICD-10-CM | POA: Insufficient documentation

## 2021-07-15 DIAGNOSIS — D500 Iron deficiency anemia secondary to blood loss (chronic): Secondary | ICD-10-CM

## 2021-07-15 LAB — CBC WITH DIFFERENTIAL/PLATELET
Abs Immature Granulocytes: 0.03 10*3/uL (ref 0.00–0.07)
Basophils Absolute: 0 10*3/uL (ref 0.0–0.1)
Basophils Relative: 0 %
Eosinophils Absolute: 0.1 10*3/uL (ref 0.0–0.5)
Eosinophils Relative: 2 %
HCT: 27.6 % — ABNORMAL LOW (ref 36.0–46.0)
Hemoglobin: 8.5 g/dL — ABNORMAL LOW (ref 12.0–15.0)
Immature Granulocytes: 1 %
Lymphocytes Relative: 21 %
Lymphs Abs: 1.4 10*3/uL (ref 0.7–4.0)
MCH: 29.1 pg (ref 26.0–34.0)
MCHC: 30.8 g/dL (ref 30.0–36.0)
MCV: 94.5 fL (ref 80.0–100.0)
Monocytes Absolute: 0.5 10*3/uL (ref 0.1–1.0)
Monocytes Relative: 7 %
Neutro Abs: 4.5 10*3/uL (ref 1.7–7.7)
Neutrophils Relative %: 69 %
Platelets: 231 10*3/uL (ref 150–400)
RBC: 2.92 MIL/uL — ABNORMAL LOW (ref 3.87–5.11)
RDW: 14.4 % (ref 11.5–15.5)
WBC: 6.5 10*3/uL (ref 4.0–10.5)
nRBC: 0 % (ref 0.0–0.2)

## 2021-07-15 LAB — FERRITIN: Ferritin: 143 ng/mL (ref 11–307)

## 2021-07-15 MED ORDER — EPOETIN ALFA-EPBX 10000 UNIT/ML IJ SOLN
10000.0000 [IU] | Freq: Once | INTRAMUSCULAR | Status: AC
  Administered 2021-07-15: 10000 [IU] via SUBCUTANEOUS
  Filled 2021-07-15: qty 1

## 2021-07-22 ENCOUNTER — Inpatient Hospital Stay: Payer: Medicare HMO

## 2021-07-22 ENCOUNTER — Other Ambulatory Visit: Payer: Self-pay

## 2021-07-22 VITALS — BP 149/56 | HR 85 | Temp 98.3°F | Resp 17

## 2021-07-22 DIAGNOSIS — N184 Chronic kidney disease, stage 4 (severe): Secondary | ICD-10-CM | POA: Diagnosis not present

## 2021-07-22 DIAGNOSIS — Z7901 Long term (current) use of anticoagulants: Secondary | ICD-10-CM | POA: Diagnosis not present

## 2021-07-22 DIAGNOSIS — M8589 Other specified disorders of bone density and structure, multiple sites: Secondary | ICD-10-CM | POA: Diagnosis not present

## 2021-07-22 DIAGNOSIS — I4891 Unspecified atrial fibrillation: Secondary | ICD-10-CM | POA: Diagnosis not present

## 2021-07-22 DIAGNOSIS — D638 Anemia in other chronic diseases classified elsewhere: Secondary | ICD-10-CM

## 2021-07-22 DIAGNOSIS — J449 Chronic obstructive pulmonary disease, unspecified: Secondary | ICD-10-CM | POA: Diagnosis not present

## 2021-07-22 DIAGNOSIS — E785 Hyperlipidemia, unspecified: Secondary | ICD-10-CM | POA: Diagnosis not present

## 2021-07-22 DIAGNOSIS — I129 Hypertensive chronic kidney disease with stage 1 through stage 4 chronic kidney disease, or unspecified chronic kidney disease: Secondary | ICD-10-CM | POA: Diagnosis not present

## 2021-07-22 DIAGNOSIS — D631 Anemia in chronic kidney disease: Secondary | ICD-10-CM | POA: Diagnosis not present

## 2021-07-22 DIAGNOSIS — E611 Iron deficiency: Secondary | ICD-10-CM | POA: Diagnosis not present

## 2021-07-22 MED ORDER — SODIUM CHLORIDE 0.9 % IV SOLN
300.0000 mg | Freq: Once | INTRAVENOUS | Status: AC
  Administered 2021-07-22: 300 mg via INTRAVENOUS
  Filled 2021-07-22: qty 300

## 2021-07-22 MED ORDER — SODIUM CHLORIDE 0.9 % IV SOLN: Freq: Once | INTRAVENOUS | Status: AC

## 2021-07-22 NOTE — Patient Instructions (Signed)

## 2021-08-05 ENCOUNTER — Inpatient Hospital Stay: Payer: Medicare HMO | Admitting: Hematology

## 2021-08-05 ENCOUNTER — Inpatient Hospital Stay: Payer: Medicare HMO

## 2021-08-05 ENCOUNTER — Other Ambulatory Visit: Payer: Self-pay

## 2021-08-05 VITALS — BP 155/49 | HR 66 | Temp 98.6°F | Resp 18 | Ht 60.0 in | Wt 159.2 lb

## 2021-08-05 VITALS — BP 169/56 | HR 70 | Temp 98.1°F | Resp 18

## 2021-08-05 DIAGNOSIS — I129 Hypertensive chronic kidney disease with stage 1 through stage 4 chronic kidney disease, or unspecified chronic kidney disease: Secondary | ICD-10-CM | POA: Diagnosis not present

## 2021-08-05 DIAGNOSIS — Z7901 Long term (current) use of anticoagulants: Secondary | ICD-10-CM | POA: Diagnosis not present

## 2021-08-05 DIAGNOSIS — C50211 Malignant neoplasm of upper-inner quadrant of right female breast: Secondary | ICD-10-CM

## 2021-08-05 DIAGNOSIS — N184 Chronic kidney disease, stage 4 (severe): Secondary | ICD-10-CM | POA: Diagnosis not present

## 2021-08-05 DIAGNOSIS — Z170 Estrogen receptor positive status [ER+]: Secondary | ICD-10-CM

## 2021-08-05 DIAGNOSIS — M8589 Other specified disorders of bone density and structure, multiple sites: Secondary | ICD-10-CM | POA: Diagnosis not present

## 2021-08-05 DIAGNOSIS — Z1231 Encounter for screening mammogram for malignant neoplasm of breast: Secondary | ICD-10-CM | POA: Diagnosis not present

## 2021-08-05 DIAGNOSIS — D631 Anemia in chronic kidney disease: Secondary | ICD-10-CM | POA: Diagnosis not present

## 2021-08-05 DIAGNOSIS — I4891 Unspecified atrial fibrillation: Secondary | ICD-10-CM | POA: Diagnosis not present

## 2021-08-05 DIAGNOSIS — D638 Anemia in other chronic diseases classified elsewhere: Secondary | ICD-10-CM

## 2021-08-05 DIAGNOSIS — D500 Iron deficiency anemia secondary to blood loss (chronic): Secondary | ICD-10-CM

## 2021-08-05 DIAGNOSIS — J449 Chronic obstructive pulmonary disease, unspecified: Secondary | ICD-10-CM | POA: Diagnosis not present

## 2021-08-05 DIAGNOSIS — E785 Hyperlipidemia, unspecified: Secondary | ICD-10-CM | POA: Diagnosis not present

## 2021-08-05 DIAGNOSIS — E611 Iron deficiency: Secondary | ICD-10-CM | POA: Diagnosis not present

## 2021-08-05 LAB — CBC WITH DIFFERENTIAL/PLATELET
Abs Immature Granulocytes: 0.02 10*3/uL (ref 0.00–0.07)
Basophils Absolute: 0 10*3/uL (ref 0.0–0.1)
Basophils Relative: 1 %
Eosinophils Absolute: 0.1 10*3/uL (ref 0.0–0.5)
Eosinophils Relative: 2 %
HCT: 31.1 % — ABNORMAL LOW (ref 36.0–46.0)
Hemoglobin: 9.6 g/dL — ABNORMAL LOW (ref 12.0–15.0)
Immature Granulocytes: 0 %
Lymphocytes Relative: 19 %
Lymphs Abs: 1.2 10*3/uL (ref 0.7–4.0)
MCH: 29.3 pg (ref 26.0–34.0)
MCHC: 30.9 g/dL (ref 30.0–36.0)
MCV: 94.8 fL (ref 80.0–100.0)
Monocytes Absolute: 0.4 10*3/uL (ref 0.1–1.0)
Monocytes Relative: 7 %
Neutro Abs: 4.5 10*3/uL (ref 1.7–7.7)
Neutrophils Relative %: 71 %
Platelets: 174 10*3/uL (ref 150–400)
RBC: 3.28 MIL/uL — ABNORMAL LOW (ref 3.87–5.11)
RDW: 14.2 % (ref 11.5–15.5)
WBC: 6.4 10*3/uL (ref 4.0–10.5)
nRBC: 0 % (ref 0.0–0.2)

## 2021-08-05 LAB — FERRITIN: Ferritin: 136 ng/mL (ref 11–307)

## 2021-08-05 MED ORDER — SODIUM CHLORIDE 0.9 % IV SOLN: Freq: Once | INTRAVENOUS | Status: AC

## 2021-08-05 MED ORDER — SODIUM CHLORIDE 0.9 % IV SOLN
300.0000 mg | Freq: Once | INTRAVENOUS | Status: AC
  Administered 2021-08-05: 300 mg via INTRAVENOUS
  Filled 2021-08-05: qty 300

## 2021-08-05 NOTE — Progress Notes (Signed)
[place]   Telephone:(336) [phone] Fax:(336) [phone]   Clinic Follow up Note   Patient Care Team: Leeroy Cha, MD as PCP - General (Internal Medicine) Lorretta Harp, MD as PCP - Cardiology (Cardiology) Juanito Doom, MD as Consulting Physician (Pulmonary Disease) Truitt Merle, MD as Consulting Physician (Hematology) Erroll Luna, MD as Consulting Physician (General Surgery)  Date of Service:  08/05/2021  CHIEF COMPLAINT: f/u of anemia, h/o breast cancer  CURRENT THERAPY:  Retacrit injection, starting 07/01/21  ASSESSMENT & PLAN:  Courtney Cline is a 88 y.o. female with   1. Normocytic anemia secondary to iron deficiency and chronic kidney disease -long standing history of low hgb secondary to CKD -she is on Eliquis for A.fib -endorses taking oral iron for ~1 year -prior stool occult test was negative. -she recently presented to her PCP with nosebleeds. She was seen by Dr. Fredric Dine in ENT on 07/05/21. -we started her on retacrit injections on 07/01/21. She also received IV Venofer on 5/25 and 6/8. She tolerates these well with no side effects.  -lab reviewed, hgb is up to 9.6 today. Ferritin is pending, will give IV iron if <100   2. CKD, stage IV -creatinine has been elevated since at least 2018 -most recent cr improved to 1.43 on 06/24/21.   3. A.fib, COPD -she is oxygen-dependent since ~2015 -she is on Eliquis -followed by Drs. McQuaid and Berry   4. H/o Stage IA Right Breast Cancer, ER+/PR+/Her2- -diagnosed in 10/2013, s/p right lumpectomy. Completed 5 years of anastrozole in 11/2018. -she is overdue for annual mammogram. I encouraged her to get this done soon.     PLAN: -lab and Retacrit injection every 2 weeks at same dose for now, titrate dose if needed -IV Venofer if her ferritin less than 100, on different day of retacrit  -mammogram due, I ordered for her today -f/u in 8 weeks   No problem-specific Assessment &  Plan notes found for this encounter.   SUMMARY OF ONCOLOGIC HISTORY: Oncology History Overview Note  Breast cancer of upper-inner quadrant of right female breast   Staging form: Breast, AJCC 7th Edition     Clinical: Stage Unknown (T1c, NX, cM0) - Signed by Glean Salvo, MD on 11/19/2013       Staging comments: ER 100%+, PR 100%+, Ki-67 7%      Breast cancer of upper-inner quadrant of right female breast ([place])  12/06/2012 Imaging   Bone Density performed at [facility] physicians T score Lumbar spine -0.1 (-0.7 in 2012), Right neck femur -1.4 (-1.2 before), Left neck femur -1.4 (-1.5 before) and Interpretation is OSTEOPENIA by WHO criteria.    10/11/2013 Mammogram   Abnormal Screening mammogram showing Right Breast mass.   10/23/2013 Breast US   Diagnostic mammogram and US showed an irregular hypoechoic mass at 9'0 clock position right breast 8 cm from nipple. [place] measurement 1.2x0.7x1.1 cm, no axillary adenopathy.   10/23/2013 Initial Biopsy   US guided biopsy  done with clip placement.   10/23/2013 Pathology Results   Invasive ductal carcinoma (IDC), DCIS, invasive cancer grade 2, ER 100%+, PR 100%+, Ki-67% 7%, HER-2/NEU by CISH No amplification, ratio of her2:cep17 1.00, average her2 copy number per cell 1.95 (HER 2 Negative tumor). Molecular Classification LUMINAL A.   11/01/2013 Surgery   Initial surgical evaluation by Dr Marcello Moores Cornett: "Not good surgical candidate because of pulmonary status". Recommended anti-estrogen therapy.   11/18/2013 -  Anti-estrogen oral therapy   Anastrozole 1 mg once  daily   12/20/2013 Surgery   right breast lumpectomy without SLN biopsy, negative margins    12/20/2013 Pathologic Stage   pT1cNxMx, G2, LVI (-), tumor measures 1.2cm. Surgical margins are negative.     10/18/2016 Imaging   MM DIAG BREAST TOMO BILATERAL 10/18/16 IMPRESSION: No mammographic evidence of malignancy involving either breast. Expected post lumpectomy changes in the right  breast.   11/08/2018 Mammogram   IMPRESSION: No evidence of malignancy in either breast. Lumpectomy changes on the right. RECOMMENDATION: Diagnostic mammogram is suggested in 1 year. (Code:DM-B-01Y)      INTERVAL HISTORY:  [name] is here for a follow up of anemia. She was last seen by me on 06/24/21. She presents to the clinic accompanied by her son. She reports she is doing well overall, denies any problems from retacrit injections.   All other systems were reviewed with the patient and are negative.  MEDICAL HISTORY:  Past Medical History:  Diagnosis Date   Allergic rhinitis    Arthritis    Breast calcification seen on mammogram    Right breast   Breast cancer (HCC)    COPD (chronic obstructive pulmonary disease) (HCC)    GERD (gastroesophageal reflux disease)    Hyperglycemia    Hyperlipidemia    Hypertension    Low back pain    On home oxygen therapy    all the time   Osteopenia    Peripheral edema    Shortness of breath    Subclavian artery stenosis, left ([place])    Vitamin D deficiency    Wears dentures    top   Wears glasses    reading    SURGICAL HISTORY: Past Surgical History:  Procedure Laterality Date   BREAST BIOPSY     BREAST LUMPECTOMY     right 2015   BREAST LUMPECTOMY WITH RADIOACTIVE SEED LOCALIZATION Right 12/20/2013   Procedure: RIGHT BREAST LUMPECTOMY WITH RADIOACTIVE SEED LOCALIZATION;  Surgeon: Erroll Luna, MD;  Location: [place];  Service: General;  Laterality: Right;   CAROTID DUPLEX  2014   CATARACT EXTRACTION     both eyes    I have reviewed the social history and family history with the patient and they are unchanged from previous note.  ALLERGIES:  has No Known Allergies.  MEDICATIONS:  Current Outpatient Medications  Medication Sig Dispense Refill   acetaminophen (TYLENOL) 325 MG tablet Take 650 mg by mouth every 6 (six) hours as needed for mild pain or headache.      apixaban (ELIQUIS) 2.5  MG TABS tablet Take 1 tablet (2.5 mg total) by mouth 2 (two) times daily. 60 tablet 0   Cholecalciferol (VITAMIN D) 2000 UNITS CAPS Take 2,000 Units by mouth daily.      Cyanocobalamin (VITAMIN B 12 PO) Take 1 tablet by mouth daily.     Fluticasone-Umeclidin-Vilant (TRELEGY ELLIPTA) 100-62.5-25 MCG/ACT AEPB Inhale 100 mcg into the lungs daily. 60 each 0   furosemide (LASIX) 40 MG tablet Take 0.5 tablets (20 mg total) by mouth 2 (two) times daily. <PLEASE MAKE APPOINTMENT FOR REFILLS> 30 tablet 0   metoprolol tartrate (LOPRESSOR) 25 MG tablet Take 1 tablet (25 mg total) by mouth 2 (two) times daily. 60 tablet 0   PROAIR HFA 108 (90 Base) MCG/ACT inhaler TAKE 2 PUFFS BY MOUTH EVERY 6 HOURS AS NEEDED FOR WHEEZE OR SHORTNESS OF BREATH (Patient taking differently: Inhale 2 puffs into the lungs every 6 (six) hours as needed for wheezing or shortness of  breath.) 8.5 Inhaler 11   rosuvastatin (CRESTOR) 40 MG tablet Take 40 mg by mouth daily.     No current facility-administered medications for this visit.   Facility-Administered Medications Ordered in Other Visits  Medication Dose Route Frequency Provider Last Rate Last Admin   iron sucrose (VENOFER) 300 mg in sodium chloride 0.9 % 250 mL IVPB  300 mg Intravenous Once Truitt Merle, MD 176.7 mL/hr at 08/05/21 1430 300 mg at 08/05/21 1430    PHYSICAL EXAMINATION: ECOG PERFORMANCE STATUS: 2 - Symptomatic, <50% confined to bed  Vitals:   08/05/21 1256  BP: (!) 155/49  Pulse: 66  Resp: 18  Temp: 98.6 F (37 C)  SpO2: 99%   Wt Readings from Last 3 Encounters:  08/05/21 159 lb 3.2 oz (72.2 kg)  06/24/21 154 lb 4.8 oz (70 kg)  05/06/21 156 lb 12.8 oz (71.1 kg)     GENERAL:alert, no distress and comfortable SKIN: skin color normal, no rashes or significant lesions EYES: normal, Conjunctiva are pink and non-injected, sclera clear  NEURO: alert & oriented x 3 with fluent speech  LABORATORY DATA:  I have reviewed the data as listed    Latest Ref  Rng & Units 08/05/2021   12:26 PM 07/15/2021    2:16 PM 07/01/2021    2:16 PM  CBC  WBC 4.0 - 10.5 K/uL 6.4  6.5  6.2   Hemoglobin 12.0 - 15.0 g/dL 9.6  8.5  7.9   Hematocrit 36.0 - 46.0 % 31.1  27.6  26.0   Platelets 150 - 400 K/uL 174  231  206         Latest Ref Rng & Units 06/24/2021    2:08 PM 02/19/2021    1:10 AM 02/18/2021   12:27 PM  CMP  Glucose 70 - 99 mg/dL 101  169  176   BUN 8 - 23 mg/dL 28  44  41   Creatinine 0.44 - 1.00 mg/dL 1.43  1.81  1.77   Sodium 135 - 145 mmol/L 141  141  139   Potassium 3.5 - 5.1 mmol/L 4.3  5.3  4.7   Chloride 98 - 111 mmol/L 102  111  111   CO2 22 - 32 mmol/L 32  24  22   Calcium 8.9 - 10.3 mg/dL 9.1  9.1  9.1   Total Protein 6.5 - 8.1 g/dL 7.1     Total Bilirubin 0.3 - 1.2 mg/dL 0.3     Alkaline Phos 38 - 126 U/L 57     AST 15 - 41 U/L 14     ALT 0 - 44 U/L 9         RADIOGRAPHIC STUDIES: I have personally reviewed the radiological images as listed and agreed with the findings in the report. No results found.    Orders Placed This Encounter  Procedures   MM Digital Screening    Standing Status:   Future    Standing Expiration Date:   08/05/2022    Order Specific Question:   Reason for Exam (SYMPTOM  OR DIAGNOSIS REQUIRED)    Answer:   screening    Order Specific Question:   Preferred imaging location?    Answer:   [facility]   All questions were answered. The patient knows to call the clinic with any problems, questions or concerns. No barriers to learning was detected. The total time spent in the appointment was 20 minutes.     Truitt Merle, MD 08/05/2021  I, Wilburn Mylar, am acting as scribe for Truitt Merle, MD.   I have reviewed the above documentation for accuracy and completeness, and I agree with the above.

## 2021-08-05 NOTE — Progress Notes (Signed)
Pt declined to stay for 30 minute observation period post Venofer infusion. VSS. No complaints at time of discharge. Wheeled to lobby with O2 on.

## 2021-08-09 ENCOUNTER — Telehealth: Payer: Self-pay | Admitting: Hematology

## 2021-08-16 ENCOUNTER — Ambulatory Visit
Admission: RE | Admit: 2021-08-16 | Discharge: 2021-08-16 | Disposition: A | Discharge status: Home / Self Care | Payer: Medicare HMO | Source: Ambulatory Visit | Attending: Hematology | Admitting: Hematology

## 2021-08-16 DIAGNOSIS — Z1231 Encounter for screening mammogram for malignant neoplasm of breast: Secondary | ICD-10-CM

## 2021-08-19 ENCOUNTER — Inpatient Hospital Stay: Payer: Medicare HMO

## 2021-08-19 ENCOUNTER — Encounter (HOSPITAL_COMMUNITY): Payer: Self-pay | Admitting: Emergency Medicine

## 2021-08-19 ENCOUNTER — Ambulatory Visit (HOSPITAL_COMMUNITY)
Admission: EM | Admit: 2021-08-19 | Discharge: 2021-08-19 | Disposition: A | Discharge status: Home / Self Care | Payer: Medicare HMO | Attending: Student | Admitting: Student

## 2021-08-19 DIAGNOSIS — L089 Local infection of the skin and subcutaneous tissue, unspecified: Secondary | ICD-10-CM

## 2021-08-19 DIAGNOSIS — S20311A Abrasion of right front wall of thorax, initial encounter: Secondary | ICD-10-CM | POA: Diagnosis not present

## 2021-08-19 MED ORDER — CLOTRIMAZOLE 1 % EX CREA: TOPICAL_CREAM | CUTANEOUS | 0 refills | Status: AC

## 2021-08-19 MED ORDER — CEPHALEXIN 250 MG/5ML PO SUSR: 250.0000 mg | Freq: Four times a day (QID) | ORAL | 0 refills | Status: AC

## 2021-08-19 NOTE — Discharge Instructions (Addendum)
-  Start the antibiotic: Keflex, 4x daily x5 days. You can take this with food if you have a sensitive stomach. -Apply clotrimazole twice daily for about 7 days. Try to keep the area clean and dry.

## 2021-08-19 NOTE — ED Provider Notes (Signed)
[place]    CSN: [id number] Arrival date & time: 08/19/21  1542      History   Chief Complaint Chief Complaint  Patient presents with   Rash    HPI Courtney Cline is a 88 y.o. female presenting with rash below the R breast x3 days following mammogram. She states she thinks she sustained a skin tear from the mammogram, as she had pain and bleeding as her breast was being removed. Since then the pain has increased with redness. Denies fevers/chills, drainage. On 3L O2 at baseline, unchanged, denies dyspnea.  HPI  Past Medical History:  Diagnosis Date   Allergic rhinitis    Arthritis    Breast calcification seen on mammogram    Right breast   Breast cancer (HCC)    COPD (chronic obstructive pulmonary disease) (HCC)    GERD (gastroesophageal reflux disease)    Hyperglycemia    Hyperlipidemia    Hypertension    Low back pain    On home oxygen therapy    all the time   Osteopenia    Peripheral edema    Shortness of breath    Subclavian artery stenosis, left (HCC)    Vitamin D deficiency    Wears dentures    top   Wears glasses    reading    Patient Active Problem List   Diagnosis Date Noted   Aortic stenosis 05/06/2021   Malnutrition of moderate degree 02/16/2021   COPD with acute exacerbation ([place]) 02/16/2021   Cough with hemoptysis 02/16/2021   AKI (acute kidney injury) ([place]) 02/15/2021   CAP (community acquired pneumonia) 01/21/2020   Nausea 01/21/2020   Pneumonia 01/21/2020   Acute cystitis without hematuria    Persistent atrial fibrillation ([place]) 08/20/2019   COPD without exacerbation ([place]) 10/15/2018   Healthcare maintenance 10/15/2018   Physical deconditioning 10/15/2018   Sinusitis 41/93/7902   Diastolic CHF ([place]) 40/97/3532   Vitamin D deficiency    Allergic rhinitis 11/23/2015   Osteopenia with high risk of fracture 11/05/2015   Post-nasal drip 03/16/2015   Anemia of chronic disease 03/10/2015   Subclavian artery stenosis,  left ([place]) 04/24/2014   Bilateral lower extremity edema 04/24/2014   Essential hypertension 04/24/2014   Hyperlipidemia 04/24/2014   Breast cancer of upper-inner quadrant of right female breast ([place]) 11/19/2013   Chronic hypoxemic respiratory failure ([place]) 10/02/2013    Past Surgical History:  Procedure Laterality Date   BREAST BIOPSY     BREAST LUMPECTOMY     right 2015   BREAST LUMPECTOMY WITH RADIOACTIVE SEED LOCALIZATION Right 12/20/2013   Procedure: RIGHT BREAST LUMPECTOMY WITH RADIOACTIVE SEED LOCALIZATION;  Surgeon: Erroll Luna, MD;  Location: [facility];  Service: General;  Laterality: Right;   CAROTID DUPLEX  2014   CATARACT EXTRACTION     both eyes    OB History   No obstetric history on file.      Home Medications    Prior to Admission medications   Medication Sig Start Date End Date Taking? Authorizing Provider  cephALEXin (KEFLEX) 250 MG/5ML suspension Take 5 mLs (250 mg total) by mouth 4 (four) times daily for 5 days. 08/19/21 08/24/21 Yes Hazel Sams, PA-C  clotrimazole (LOTRIMIN) 1 % cream Apply to affected area 2 times daily 08/19/21 08/26/21 Yes Hazel Sams, PA-C  acetaminophen (TYLENOL) 325 MG tablet Take 650 mg by mouth every 6 (six) hours as needed for mild pain or headache.     [provider]  apixaban (ELIQUIS) 2.5 MG TABS tablet Take 1 tablet (2.5 mg total) by mouth 2 (two) times daily. 02/19/21   Darliss Cheney, MD  Cholecalciferol (VITAMIN D) 2000 UNITS CAPS Take 2,000 Units by mouth daily.     [provider]  Cyanocobalamin (VITAMIN B 12 PO) Take 1 tablet by mouth daily.    [provider]  Fluticasone-Umeclidin-Vilant (TRELEGY ELLIPTA) 100-62.5-25 MCG/ACT AEPB Inhale 100 mcg into the lungs daily. 01/15/21   Martyn Ehrich, NP  furosemide (LASIX) 40 MG tablet Take 0.5 tablets (20 mg total) by mouth 2 (two) times daily. <PLEASE MAKE APPOINTMENT FOR REFILLS> 02/19/21 05/06/21  Darliss Cheney, MD  metoprolol  tartrate (LOPRESSOR) 25 MG tablet Take 1 tablet (25 mg total) by mouth 2 (two) times daily. 07/17/19   Noemi Chapel P, DO  PROAIR HFA 108 (90 Base) MCG/ACT inhaler TAKE 2 PUFFS BY MOUTH EVERY 6 HOURS AS NEEDED FOR WHEEZE OR SHORTNESS OF BREATH Patient taking differently: Inhale 2 puffs into the lungs every 6 (six) hours as needed for wheezing or shortness of breath. 08/16/17   Juanito Doom, MD  rosuvastatin (CRESTOR) 40 MG tablet Take 40 mg by mouth daily. 02/02/21   [provider]    Family History Family History  Problem Relation Age of Onset   Emphysema Mother    Emphysema Maternal Grandfather     Social History Social History   Tobacco Use   Smoking status: Former    Packs/day: 1.00    Years: 40.00    Total pack years: 40.00    Types: Cigarettes    Quit date: 02/14/1994    Years since quitting: 27.5   Smokeless tobacco: Never  Substance Use Topics   Alcohol use: No   Drug use: No     Allergies   Patient has no known allergies.   Review of Systems Review of Systems  Skin:  Positive for color change.  All other systems reviewed and are negative.    Physical Exam Triage Vital Signs ED Triage Vitals  Enc Vitals Group     BP 08/19/21 1641 (!) 182/76     Pulse Rate 08/19/21 1641 70     Resp 08/19/21 1641 18     Temp 08/19/21 1641 98.2 F (36.8 C)     Temp src --      SpO2 08/19/21 1641 94 %     Weight --      Height --      Head Circumference --      Peak Flow --      Pain Score 08/19/21 1640 0     Pain Loc --      Pain Edu? --      Excl. in [place]? --    No data found.  Updated Vital Signs BP (!) 182/76   Pulse 70   Temp 98.2 F (36.8 C)   Resp 18   SpO2 94%   Visual Acuity Right Eye Distance:   Left Eye Distance:   Bilateral Distance:    Right Eye Near:   Left Eye Near:    Bilateral Near:     Physical Exam Vitals reviewed.  Constitutional:      General: She is not in acute distress.    Appearance: Normal appearance. She is not  ill-appearing.  HENT:     Head: Normocephalic and atraumatic.  Pulmonary:     Effort: Pulmonary effort is normal.  Skin:    Comments: R inframammary region with 2cm skin  tear, no active bleeding. There is 5cm linear area of erythema, warmth, and tenderness.   Neurological:     General: No focal deficit present.     Mental Status: She is alert and oriented to person, place, and time.  Psychiatric:        Mood and Affect: Mood normal.        Behavior: Behavior normal.        Thought Content: Thought content normal.        Judgment: Judgment normal.      UC Treatments / Results  Labs (all labs ordered are listed, but only abnormal results are displayed) Labs Reviewed - No data to display  EKG   Radiology No results found.  Procedures Procedures (including critical care time)  Medications Ordered in UC Medications - No data to display  Initial Impression / Assessment and Plan / UC Course  I have reviewed the triage vital signs and the nursing notes.  Pertinent labs & imaging results that were available during my care of the patient were reviewed by me and considered in my medical decision making (see chart for details).     This patient is a very pleasant 88 y.o. year old female presenting with cellulitis following skin tear R inframmary region. Afebrile, nontachy. The infection is well circumscribed, and she is nontoxic appearing. Will manage with keflex suspension. Clotrimazole sent to cover for possible fungal component.  Of note, she is on 3 L of home oxygen by nasal cannula, her oxygen was running low during the visit so she was placed on our oxygen.  She denies any increase in dyspnea, chest pain, fevers; she is feeling well overall. ED return precautions discussed. Patient verbalizes understanding and agreement.    Final Clinical Impressions(s) / UC Diagnoses   Final diagnoses:  Abrasion of right chest wall with infection, initial encounter     Discharge  Instructions      -Start the antibiotic: Keflex, 4x daily x5 days. You can take this with food if you have a sensitive stomach. -Apply clotrimazole twice daily for about 7 days. Try to keep the area clean and dry.    ED Prescriptions     Medication Sig Dispense Auth. Provider   cephALEXin (KEFLEX) 250 MG/5ML suspension Take 5 mLs (250 mg total) by mouth 4 (four) times daily for 5 days. 100 mL Hazel Sams, PA-C   clotrimazole (LOTRIMIN) 1 % cream Apply to affected area 2 times daily 15 g Hazel Sams, PA-C      PDMP not reviewed this encounter.   Hazel Sams, PA-C 08/19/21 1717

## 2021-08-20 ENCOUNTER — Inpatient Hospital Stay: Payer: Medicare HMO | Attending: Hematology

## 2021-08-20 ENCOUNTER — Inpatient Hospital Stay: Payer: Medicare HMO

## 2021-08-20 ENCOUNTER — Other Ambulatory Visit: Payer: Self-pay

## 2021-08-20 VITALS — BP 149/41 | HR 76 | Temp 98.3°F | Resp 18

## 2021-08-20 DIAGNOSIS — N184 Chronic kidney disease, stage 4 (severe): Secondary | ICD-10-CM | POA: Insufficient documentation

## 2021-08-20 DIAGNOSIS — D631 Anemia in chronic kidney disease: Secondary | ICD-10-CM | POA: Insufficient documentation

## 2021-08-20 DIAGNOSIS — Z79899 Other long term (current) drug therapy: Secondary | ICD-10-CM | POA: Diagnosis not present

## 2021-08-20 DIAGNOSIS — E611 Iron deficiency: Secondary | ICD-10-CM | POA: Diagnosis not present

## 2021-08-20 DIAGNOSIS — D638 Anemia in other chronic diseases classified elsewhere: Secondary | ICD-10-CM

## 2021-08-20 DIAGNOSIS — D500 Iron deficiency anemia secondary to blood loss (chronic): Secondary | ICD-10-CM

## 2021-08-20 LAB — CBC WITH DIFFERENTIAL/PLATELET
Abs Immature Granulocytes: 0.01 10*3/uL (ref 0.00–0.07)
Basophils Absolute: 0 10*3/uL (ref 0.0–0.1)
Basophils Relative: 1 %
Eosinophils Absolute: 0.1 10*3/uL (ref 0.0–0.5)
Eosinophils Relative: 2 %
HCT: 29.8 % — ABNORMAL LOW (ref 36.0–46.0)
Hemoglobin: 9.4 g/dL — ABNORMAL LOW (ref 12.0–15.0)
Immature Granulocytes: 0 %
Lymphocytes Relative: 21 %
Lymphs Abs: 1.1 10*3/uL (ref 0.7–4.0)
MCH: 29.7 pg (ref 26.0–34.0)
MCHC: 31.5 g/dL (ref 30.0–36.0)
MCV: 94.3 fL (ref 80.0–100.0)
Monocytes Absolute: 0.4 10*3/uL (ref 0.1–1.0)
Monocytes Relative: 7 %
Neutro Abs: 3.7 10*3/uL (ref 1.7–7.7)
Neutrophils Relative %: 69 %
Platelets: 179 10*3/uL (ref 150–400)
RBC: 3.16 MIL/uL — ABNORMAL LOW (ref 3.87–5.11)
RDW: 14.4 % (ref 11.5–15.5)
WBC: 5.3 10*3/uL (ref 4.0–10.5)
nRBC: 0 % (ref 0.0–0.2)

## 2021-08-20 LAB — FERRITIN: Ferritin: 199 ng/mL (ref 11–307)

## 2021-08-20 MED ORDER — EPOETIN ALFA-EPBX 10000 UNIT/ML IJ SOLN
10000.0000 [IU] | Freq: Once | INTRAMUSCULAR | Status: AC
  Administered 2021-08-20: 10000 [IU] via SUBCUTANEOUS
  Filled 2021-08-20: qty 1

## 2021-08-20 NOTE — Patient Instructions (Signed)
Epoetin Alfa injection ?What is this medication? ?EPOETIN ALFA (e POE e tin AL fa) helps your body make more red blood cells. This medicine is used to treat anemia caused by chronic kidney disease, cancer chemotherapy, or HIV-therapy. It may also be used before surgery if you have anemia. ?This medicine may be used for other purposes; ask your health care provider or pharmacist if you have questions. ?COMMON BRAND NAME(S): Epogen, Procrit, Retacrit ?What should I tell my care team before I take this medication? ?They need to know if you have any of these conditions: ?cancer ?heart disease ?high blood pressure ?history of blood clots ?history of stroke ?low levels of folate, iron, or vitamin B12 in the blood ?seizures ?an unusual or allergic reaction to erythropoietin, albumin, benzyl alcohol, hamster proteins, other medicines, foods, dyes, or preservatives ?pregnant or trying to get pregnant ?breast-feeding ?How should I use this medication? ?This medicine is for injection into a vein or under the skin. It is usually given by a health care professional in a hospital or clinic setting. ?If you get this medicine at home, you will be taught how to prepare and give this medicine. Use exactly as directed. Take your medicine at regular intervals. Do not take your medicine more often than directed. ?It is important that you put your used needles and syringes in a special sharps container. Do not put them in a trash can. If you do not have a sharps container, call your pharmacist or healthcare provider to get one. ?A special MedGuide will be given to you by the pharmacist with each prescription and refill. Be sure to read this information carefully each time. ?Talk to your pediatrician regarding the use of this medicine in children. While this drug may be prescribed for selected conditions, precautions do apply. ?Overdosage: If you think you have taken too much of this medicine contact a poison control center or emergency  room at once. ?NOTE: This medicine is only for you. Do not share this medicine with others. ?What if I miss a dose? ?If you miss a dose, take it as soon as you can. If it is almost time for your next dose, take only that dose. Do not take double or extra doses. ?What may interact with this medication? ?Interactions have not been studied. ?This list may not describe all possible interactions. Give your health care provider a list of all the medicines, herbs, non-prescription drugs, or dietary supplements you use. Also tell them if you smoke, drink alcohol, or use illegal drugs. Some items may interact with your medicine. ?What should I watch for while using this medication? ?Your condition will be monitored carefully while you are receiving this medicine. ?You may need blood work done while you are taking this medicine. ?This medicine may cause a decrease in vitamin B6. You should make sure that you get enough vitamin B6 while you are taking this medicine. Discuss the foods you eat and the vitamins you take with your health care professional. ?What side effects may I notice from receiving this medication? ?Side effects that you should report to your doctor or health care professional as soon as possible: ?allergic reactions like skin rash, itching or hives, swelling of the face, lips, or tongue ?seizures ?signs and symptoms of a blood clot such as breathing problems; changes in vision; chest pain; severe, sudden headache; pain, swelling, warmth in the leg; trouble speaking; sudden numbness or weakness of the face, arm or leg ?signs and symptoms of a stroke like   changes in vision; confusion; trouble speaking or understanding; severe headaches; sudden numbness or weakness of the face, arm or leg; trouble walking; dizziness; loss of balance or coordination ?Side effects that usually do not require medical attention (report to your doctor or health care professional if they continue or are  bothersome): ?chills ?cough ?dizziness ?fever ?headaches ?joint pain ?muscle cramps ?muscle pain ?nausea, vomiting ?pain, redness, or irritation at site where injected ?This list may not describe all possible side effects. Call your doctor for medical advice about side effects. You may report side effects to FDA at 1-800-FDA-1088. ?Where should I keep my medication? ?Keep out of the reach of children. ?Store in a refrigerator between 2 and 8 degrees C (36 and 46 degrees F). Do not freeze or shake. Throw away any unused portion if using a single-dose vial. Multi-dose vials can be kept in the refrigerator for up to 21 days after the initial dose. Throw away unused medicine. ?NOTE: This sheet is a summary. It may not cover all possible information. If you have questions about this medicine, talk to your doctor, pharmacist, or health care provider. ?? 2023 Elsevier/Gold Standard (2016-10-04 00:00:00) ? ?

## 2021-08-30 ENCOUNTER — Ambulatory Visit: Payer: Medicare HMO | Admitting: Pulmonary Disease

## 2021-08-30 ENCOUNTER — Encounter: Payer: Self-pay | Admitting: Pulmonary Disease

## 2021-08-30 VITALS — BP 122/78 | HR 80 | Temp 97.6°F | Ht 60.0 in | Wt 159.8 lb

## 2021-08-30 DIAGNOSIS — J9611 Chronic respiratory failure with hypoxia: Secondary | ICD-10-CM | POA: Diagnosis not present

## 2021-08-30 DIAGNOSIS — J449 Chronic obstructive pulmonary disease, unspecified: Secondary | ICD-10-CM | POA: Diagnosis not present

## 2021-08-30 NOTE — Patient Instructions (Signed)
COPD Chronic respiratory failure INCREASE Trelegy 200 ONE puff ONCE a day. Rinse mouth out after use CONTINUE Albuterol AS NEEDED for shortness of breath or wheezing Discussed huff cough technique CONTINUE 3L oxygen via nasal cannula with activity and sleep Keep up to date with vaccinations  Follow-up with me in 6 months

## 2021-08-30 NOTE — Progress Notes (Signed)
Subjective:   PATIENT ID: Courtney Cline GENDER: female DOB: 1933-11-20, MRN: [id number]   HPI  Chief Complaint  Patient presents with   New Patient (Initial Visit)    SOB     Reason for Visit: New consult for COPD  Ms. Courtney Cline is an 88 year old female former smoker with COPD, chronic respiratory failure, chronic diastolic heart failure, moderate pulmonary hypertension, hypertension, atrial fibrillation, subclavian arterial stenosis, hyperlipidemia history of breast cancer. Presents with son.  She is a former [name] patient.  Last seen on 07/17/2019.  She was seen again in clinic 08/31/2020 with NP Volanda Napoleon.  Ambulatory O2 follow-up demonstrated stable O2 requirement of 3 L. she is currently on Trelegy 100.   She is compliant with Trelegy daily and uses albuterol 2-3 times after exertion. Occasional sputum production. Denies wheezing. Does not have any symptoms at night. Compliant with her oxygen with activity and sleep. Has been on stable oxygen 8 years. Able to shower, dress and care for herself without assistance. Currently lives with husband. Sons help with grocery shopping and errands. She and husband no longer drive. She was last hospitalized 02/2021 for COPD exacerbation and this was followed by COVID infection after being discharged home. She took Paxlovid and recovered to baseline.  Social History: Former smoker. Quit in 1996. 40 pack-years. Previously housekeeper x 10 years  I have personally reviewed patient's past medical/family/social history, allergies, current medications.  Past Medical History:  Diagnosis Date   Allergic rhinitis    Arthritis    Breast calcification seen on mammogram    Right breast   Breast cancer (HCC)    COPD (chronic obstructive pulmonary disease) (HCC)    GERD (gastroesophageal reflux disease)    Hyperglycemia    Hyperlipidemia    Hypertension    Low back pain    On home oxygen therapy    all the time   Osteopenia     Peripheral edema    Shortness of breath    Subclavian artery stenosis, left (HCC)    Vitamin D deficiency    Wears dentures    top   Wears glasses    reading     Family History  Problem Relation Age of Onset   Emphysema Mother    Emphysema Maternal Grandfather      Social History   Occupational History   Occupation: retired    Comment: Chartered certified accountant  Tobacco Use   Smoking status: Former    Packs/day: 1.00    Years: 40.00    Total pack years: 40.00    Types: Cigarettes    Quit date: 02/14/1994    Years since quitting: 27.5   Smokeless tobacco: Never  Substance and Sexual Activity   Alcohol use: No   Drug use: No   Sexual activity: Not on file    No Known Allergies   Outpatient Medications Prior to Visit  Medication Sig Dispense Refill   acetaminophen (TYLENOL) 325 MG tablet Take 650 mg by mouth every 6 (six) hours as needed for mild pain or headache.      apixaban (ELIQUIS) 2.5 MG TABS tablet Take 1 tablet (2.5 mg total) by mouth 2 (two) times daily. 60 tablet 0   Cholecalciferol (VITAMIN D) 2000 UNITS CAPS Take 2,000 Units by mouth daily.      Cyanocobalamin (VITAMIN B 12 PO) Take 1 tablet by mouth daily.     Fluticasone-Umeclidin-Vilant (TRELEGY ELLIPTA) 100-62.5-25 MCG/ACT AEPB Inhale 100 mcg into the lungs daily. [place]  each 0   metoprolol tartrate (LOPRESSOR) 25 MG tablet Take 1 tablet (25 mg total) by mouth 2 (two) times daily. 60 tablet 0   PROAIR HFA 108 (90 Base) MCG/ACT inhaler TAKE 2 PUFFS BY MOUTH EVERY 6 HOURS AS NEEDED FOR WHEEZE OR SHORTNESS OF BREATH (Patient taking differently: Inhale 2 puffs into the lungs every 6 (six) hours as needed for wheezing or shortness of breath.) 8.5 Inhaler 11   rosuvastatin (CRESTOR) 40 MG tablet Take 40 mg by mouth daily.     furosemide (LASIX) 40 MG tablet Take 0.5 tablets (20 mg total) by mouth 2 (two) times daily. <PLEASE MAKE APPOINTMENT FOR REFILLS> 30 tablet 0   No facility-administered medications prior to visit.     Review of Systems  Constitutional:  Negative for chills, diaphoresis, fever, malaise/fatigue and weight loss.  HENT:  Negative for congestion.   Respiratory:  Positive for cough, sputum production and shortness of breath. Negative for hemoptysis and wheezing.   Cardiovascular:  Negative for chest pain, palpitations and leg swelling.     Objective:   Vitals:   08/30/21 1020  BP: 122/78  Pulse: 80  Temp: 97.6 F (36.4 C)  TempSrc: Oral  SpO2: 97%  Weight: 159 lb 12.8 oz (72.5 kg)  Height: 5' (1.524 m)   SpO2: 97 % O2 Device: Nasal cannula O2 Flow Rate (L/min): 3 L/min O2 Type: Continuous O2  Physical Exam: General: Elderly, frail-appearing, no acute distress HENT: [place], AT Eyes: EOMI, no scleral icterus Respiratory: Clear to auscultation bilaterally.  No crackles, wheezing or rales Cardiovascular: Irregular rate and rhythm, -M/R/G, no JVD Extremities:-Edema,-tenderness Neuro: AAO x4, CNII-XII grossly intact Psych: Normal mood, normal affect  Data Reviewed:  Imaging: CXR 02/15/21 - Hyperinflation. Unchanged basilar densities R>L  PFT: 07/17/17 FVC 1.3 (60%) FEV1 0.5 (31%) Ratio 38  Interpretation: Very severe obstructive defect  Labs: CBC    Component Value Date/Time   WBC 5.3 08/20/2021 1416   RBC 3.16 (L) 08/20/2021 1416   HGB 9.4 (L) 08/20/2021 1416   HGB 9.3 (L) 12/24/2018 1116   HGB 9.6 (L) 11/07/2016 0932   HCT 29.8 (L) 08/20/2021 1416   HCT 31.0 (L) 11/07/2016 0932   PLT 179 08/20/2021 1416   PLT 223 12/24/2018 1116   PLT 205 11/07/2016 0932   MCV 94.3 08/20/2021 1416   MCV 96.6 11/07/2016 0932   MCH 29.7 08/20/2021 1416   MCHC 31.5 08/20/2021 1416   RDW 14.4 08/20/2021 1416   RDW 13.4 11/07/2016 0932   LYMPHSABS 1.1 08/20/2021 1416   LYMPHSABS 1.3 11/07/2016 0932   MONOABS 0.4 08/20/2021 1416   MONOABS 0.4 11/07/2016 0932   EOSABS 0.1 08/20/2021 1416   EOSABS 0.2 11/07/2016 0932   BASOSABS 0.0 08/20/2021 1416   BASOSABS 0.0 11/07/2016 0932        Assessment & Plan:   Discussion: 88 year old female former smoker with COPD, chronic respiratory failure, chronic diastolic heart failure, moderate pulmonary hypertension, hypertension, atrial fibrillation, subclavian arterial stenosis, hyperlipidemia history of breast cancer. Follow-up for COPD management and to establish care. Previously followed by [name]. Increased albuterol use for shortness of breath. On low-dose Trelegy. Has minimal cough production that is improved with hydration. Discussed and demonstrated huff cough technique and encouraged good fluid intake.  COPD Chronic respiratory failure INCREASE Trelegy 200 ONE puff ONCE a day. Rinse mouth out after use CONTINUE Albuterol AS NEEDED for shortness of breath or wheezing Discussed huff cough technique CONTINUE 3L oxygen via nasal cannula with  activity and sleep   Health Maintenance Immunization History  Administered Date(s) Administered   Fluad Quad(high Dose 65+) 10/15/2018   Influenza Split 11/14/2001, 11/15/2002, 01/15/2004, 12/15/2004, 11/14/2005, 11/28/2008, 11/23/2012, 11/14/2014, 11/21/2016, 12/17/2019, 10/28/2020   Influenza, High Dose Seasonal PF 11/16/2015, 11/10/2016   Influenza,inj,Quad PF,6+ Mos 11/11/2013   Influenza-Unspecified 11/17/2017   Moderna Sars-Covid-2 Vaccination 04/13/2019, 05/11/2019, 12/16/2019, 10/28/2020   Pneumococcal Conjugate-13 11/25/2013, 12/11/2013   Pneumococcal Polysaccharide-23 11/15/2002, 07/26/2016   Td 04/05/2001   Tdap 11/10/2011   Zoster, Live 02/26/2020, 08/26/2020   CT Lung Screen - not qualified due to age and >15 years since tobacco use  No orders of the defined types were placed in this encounter. No orders of the defined types were placed in this encounter.   Return in about 6 months (around 03/02/2022).  I have spent a total time of 45-minutes on the day of the appointment reviewing prior documentation, coordinating care and discussing medical diagnosis and  plan with the patient/family. Imaging, labs and tests included in this note have been reviewed and interpreted independently by me.  [place], MD [facility] 08/30/2021 10:54 AM  Office Number [phone]

## 2021-09-02 ENCOUNTER — Other Ambulatory Visit: Payer: Self-pay

## 2021-09-02 ENCOUNTER — Inpatient Hospital Stay: Payer: Medicare HMO

## 2021-09-02 VITALS — BP 149/50 | HR 68 | Temp 98.1°F | Resp 18

## 2021-09-02 DIAGNOSIS — D631 Anemia in chronic kidney disease: Secondary | ICD-10-CM | POA: Diagnosis not present

## 2021-09-02 DIAGNOSIS — D500 Iron deficiency anemia secondary to blood loss (chronic): Secondary | ICD-10-CM

## 2021-09-02 DIAGNOSIS — D638 Anemia in other chronic diseases classified elsewhere: Secondary | ICD-10-CM

## 2021-09-02 DIAGNOSIS — Z79899 Other long term (current) drug therapy: Secondary | ICD-10-CM | POA: Diagnosis not present

## 2021-09-02 DIAGNOSIS — N184 Chronic kidney disease, stage 4 (severe): Secondary | ICD-10-CM | POA: Diagnosis not present

## 2021-09-02 DIAGNOSIS — E611 Iron deficiency: Secondary | ICD-10-CM | POA: Diagnosis not present

## 2021-09-02 LAB — CBC WITH DIFFERENTIAL/PLATELET
Abs Immature Granulocytes: 0.01 10*3/uL (ref 0.00–0.07)
Basophils Absolute: 0 10*3/uL (ref 0.0–0.1)
Basophils Relative: 0 %
Eosinophils Absolute: 0.1 10*3/uL (ref 0.0–0.5)
Eosinophils Relative: 3 %
HCT: 30.5 % — ABNORMAL LOW (ref 36.0–46.0)
Hemoglobin: 9.5 g/dL — ABNORMAL LOW (ref 12.0–15.0)
Immature Granulocytes: 0 %
Lymphocytes Relative: 24 %
Lymphs Abs: 1.1 10*3/uL (ref 0.7–4.0)
MCH: 29.6 pg (ref 26.0–34.0)
MCHC: 31.1 g/dL (ref 30.0–36.0)
MCV: 95 fL (ref 80.0–100.0)
Monocytes Absolute: 0.4 10*3/uL (ref 0.1–1.0)
Monocytes Relative: 8 %
Neutro Abs: 3 10*3/uL (ref 1.7–7.7)
Neutrophils Relative %: 65 %
Platelets: 149 10*3/uL — ABNORMAL LOW (ref 150–400)
RBC: 3.21 MIL/uL — ABNORMAL LOW (ref 3.87–5.11)
RDW: 14.6 % (ref 11.5–15.5)
WBC: 4.6 10*3/uL (ref 4.0–10.5)
nRBC: 0 % (ref 0.0–0.2)

## 2021-09-02 MED ORDER — EPOETIN ALFA-EPBX 10000 UNIT/ML IJ SOLN
10000.0000 [IU] | Freq: Once | INTRAMUSCULAR | Status: AC
  Administered 2021-09-02: 10000 [IU] via SUBCUTANEOUS
  Filled 2021-09-02: qty 1

## 2021-09-03 LAB — FERRITIN: Ferritin: 154 ng/mL (ref 11–307)

## 2021-09-06 ENCOUNTER — Other Ambulatory Visit: Payer: Self-pay | Admitting: Pharmacist

## 2021-09-16 ENCOUNTER — Inpatient Hospital Stay: Payer: Medicare HMO | Attending: Hematology

## 2021-09-16 ENCOUNTER — Inpatient Hospital Stay: Payer: Medicare HMO

## 2021-09-16 ENCOUNTER — Other Ambulatory Visit: Payer: Self-pay

## 2021-09-16 VITALS — BP 141/55 | HR 84 | Temp 98.9°F | Resp 20

## 2021-09-16 DIAGNOSIS — D631 Anemia in chronic kidney disease: Secondary | ICD-10-CM | POA: Diagnosis not present

## 2021-09-16 DIAGNOSIS — D638 Anemia in other chronic diseases classified elsewhere: Secondary | ICD-10-CM

## 2021-09-16 DIAGNOSIS — N184 Chronic kidney disease, stage 4 (severe): Secondary | ICD-10-CM | POA: Insufficient documentation

## 2021-09-16 LAB — CBC WITH DIFFERENTIAL/PLATELET
Abs Immature Granulocytes: 0.01 10*3/uL (ref 0.00–0.07)
Basophils Absolute: 0 10*3/uL (ref 0.0–0.1)
Basophils Relative: 1 %
Eosinophils Absolute: 0.1 10*3/uL (ref 0.0–0.5)
Eosinophils Relative: 2 %
HCT: 32.4 % — ABNORMAL LOW (ref 36.0–46.0)
Hemoglobin: 10.1 g/dL — ABNORMAL LOW (ref 12.0–15.0)
Immature Granulocytes: 0 %
Lymphocytes Relative: 25 %
Lymphs Abs: 1.3 10*3/uL (ref 0.7–4.0)
MCH: 29.9 pg (ref 26.0–34.0)
MCHC: 31.2 g/dL (ref 30.0–36.0)
MCV: 95.9 fL (ref 80.0–100.0)
Monocytes Absolute: 0.4 10*3/uL (ref 0.1–1.0)
Monocytes Relative: 8 %
Neutro Abs: 3.4 10*3/uL (ref 1.7–7.7)
Neutrophils Relative %: 64 %
Platelets: 174 10*3/uL (ref 150–400)
RBC: 3.38 MIL/uL — ABNORMAL LOW (ref 3.87–5.11)
RDW: 14.6 % (ref 11.5–15.5)
WBC: 5.3 10*3/uL (ref 4.0–10.5)
nRBC: 0 % (ref 0.0–0.2)

## 2021-09-16 LAB — FERRITIN: Ferritin: 133 ng/mL (ref 11–307)

## 2021-09-16 MED ORDER — EPOETIN ALFA 10000 UNIT/ML IJ SOLN
10000.0000 [IU] | Freq: Once | INTRAMUSCULAR | Status: DC
  Filled 2021-09-16: qty 1

## 2021-09-16 NOTE — Progress Notes (Signed)
Pt does not have prior auth for Retacrit injection today. Per Dr.Feng, pt's Hgb is 10.1 and for her to reschedule for next week in order to get authorization for medication. Pt has been sent to scheduling and verbalized understanding.

## 2021-09-22 ENCOUNTER — Other Ambulatory Visit: Payer: Self-pay

## 2021-09-22 ENCOUNTER — Inpatient Hospital Stay: Payer: Medicare HMO

## 2021-09-22 VITALS — BP 150/50 | HR 66 | Temp 98.4°F | Resp 20

## 2021-09-22 DIAGNOSIS — N184 Chronic kidney disease, stage 4 (severe): Secondary | ICD-10-CM | POA: Diagnosis not present

## 2021-09-22 DIAGNOSIS — D638 Anemia in other chronic diseases classified elsewhere: Secondary | ICD-10-CM

## 2021-09-22 DIAGNOSIS — D631 Anemia in chronic kidney disease: Secondary | ICD-10-CM | POA: Diagnosis not present

## 2021-09-22 DIAGNOSIS — D500 Iron deficiency anemia secondary to blood loss (chronic): Secondary | ICD-10-CM

## 2021-09-22 LAB — CBC WITH DIFFERENTIAL/PLATELET
Abs Immature Granulocytes: 0.01 10*3/uL (ref 0.00–0.07)
Basophils Absolute: 0 10*3/uL (ref 0.0–0.1)
Basophils Relative: 0 %
Eosinophils Absolute: 0.1 10*3/uL (ref 0.0–0.5)
Eosinophils Relative: 2 %
HCT: 32.5 % — ABNORMAL LOW (ref 36.0–46.0)
Hemoglobin: 10.3 g/dL — ABNORMAL LOW (ref 12.0–15.0)
Immature Granulocytes: 0 %
Lymphocytes Relative: 20 %
Lymphs Abs: 1.2 10*3/uL (ref 0.7–4.0)
MCH: 29.8 pg (ref 26.0–34.0)
MCHC: 31.7 g/dL (ref 30.0–36.0)
MCV: 93.9 fL (ref 80.0–100.0)
Monocytes Absolute: 0.4 10*3/uL (ref 0.1–1.0)
Monocytes Relative: 7 %
Neutro Abs: 4.4 10*3/uL (ref 1.7–7.7)
Neutrophils Relative %: 71 %
Platelets: 177 10*3/uL (ref 150–400)
RBC: 3.46 MIL/uL — ABNORMAL LOW (ref 3.87–5.11)
RDW: 14.4 % (ref 11.5–15.5)
WBC: 6.2 10*3/uL (ref 4.0–10.5)
nRBC: 0 % (ref 0.0–0.2)

## 2021-09-22 MED ORDER — EPOETIN ALFA-EPBX 10000 UNIT/ML IJ SOLN
10000.0000 [IU] | Freq: Once | INTRAMUSCULAR | Status: AC
  Administered 2021-09-22: 10000 [IU] via SUBCUTANEOUS
  Filled 2021-09-22: qty 1

## 2021-09-22 NOTE — Patient Instructions (Signed)

## 2021-09-23 LAB — FERRITIN: Ferritin: 140 ng/mL (ref 11–307)

## 2021-09-30 ENCOUNTER — Telehealth: Payer: Self-pay | Admitting: Pulmonary Disease

## 2021-09-30 DIAGNOSIS — I10 Essential (primary) hypertension: Secondary | ICD-10-CM | POA: Diagnosis not present

## 2021-09-30 DIAGNOSIS — I480 Paroxysmal atrial fibrillation: Secondary | ICD-10-CM | POA: Diagnosis not present

## 2021-09-30 DIAGNOSIS — E785 Hyperlipidemia, unspecified: Secondary | ICD-10-CM | POA: Diagnosis not present

## 2021-09-30 DIAGNOSIS — J449 Chronic obstructive pulmonary disease, unspecified: Secondary | ICD-10-CM | POA: Diagnosis not present

## 2021-09-30 DIAGNOSIS — I5032 Chronic diastolic (congestive) heart failure: Secondary | ICD-10-CM | POA: Diagnosis not present

## 2021-09-30 MED ORDER — TRELEGY ELLIPTA 200-62.5-25 MCG/ACT IN AEPB: 1.0000 | INHALATION_SPRAY | Freq: Every day | RESPIRATORY_TRACT | 5 refills | Status: DC

## 2021-09-30 NOTE — Telephone Encounter (Signed)
Called and spoke with patient and confirmed pharmacy with her and went over that I was sending in her trelegy to her pharmacy. Patient verbalized understanding. Nothing further needed

## 2021-10-01 ENCOUNTER — Inpatient Hospital Stay: Payer: Medicare HMO

## 2021-10-01 ENCOUNTER — Inpatient Hospital Stay: Payer: Medicare HMO | Admitting: Hematology

## 2021-10-05 ENCOUNTER — Ambulatory Visit (HOSPITAL_COMMUNITY)
Admission: EM | Admit: 2021-10-05 | Discharge: 2021-10-05 | Disposition: A | Discharge status: Home / Self Care | Payer: Medicare HMO

## 2021-10-05 ENCOUNTER — Other Ambulatory Visit: Payer: Self-pay

## 2021-10-05 ENCOUNTER — Inpatient Hospital Stay: Payer: Medicare HMO

## 2021-10-05 ENCOUNTER — Encounter: Payer: Self-pay | Admitting: Hematology

## 2021-10-05 ENCOUNTER — Encounter (HOSPITAL_COMMUNITY): Payer: Self-pay

## 2021-10-05 ENCOUNTER — Inpatient Hospital Stay (HOSPITAL_BASED_OUTPATIENT_CLINIC_OR_DEPARTMENT_OTHER): Payer: Medicare HMO | Admitting: Hematology

## 2021-10-05 VITALS — BP 143/48 | HR 66 | Temp 97.5°F | Resp 18 | Ht 60.0 in | Wt 161.3 lb

## 2021-10-05 DIAGNOSIS — Z7901 Long term (current) use of anticoagulants: Secondary | ICD-10-CM | POA: Diagnosis not present

## 2021-10-05 DIAGNOSIS — D631 Anemia in chronic kidney disease: Secondary | ICD-10-CM | POA: Diagnosis not present

## 2021-10-05 DIAGNOSIS — D500 Iron deficiency anemia secondary to blood loss (chronic): Secondary | ICD-10-CM

## 2021-10-05 DIAGNOSIS — N184 Chronic kidney disease, stage 4 (severe): Secondary | ICD-10-CM | POA: Diagnosis not present

## 2021-10-05 DIAGNOSIS — C50211 Malignant neoplasm of upper-inner quadrant of right female breast: Secondary | ICD-10-CM

## 2021-10-05 DIAGNOSIS — S40811A Abrasion of right upper arm, initial encounter: Secondary | ICD-10-CM | POA: Diagnosis not present

## 2021-10-05 DIAGNOSIS — Z170 Estrogen receptor positive status [ER+]: Secondary | ICD-10-CM

## 2021-10-05 LAB — CBC WITH DIFFERENTIAL/PLATELET
Abs Immature Granulocytes: 0.01 10*3/uL (ref 0.00–0.07)
Basophils Absolute: 0 10*3/uL (ref 0.0–0.1)
Basophils Relative: 1 %
Eosinophils Absolute: 0.2 10*3/uL (ref 0.0–0.5)
Eosinophils Relative: 3 %
HCT: 34.8 % — ABNORMAL LOW (ref 36.0–46.0)
Hemoglobin: 11 g/dL — ABNORMAL LOW (ref 12.0–15.0)
Immature Granulocytes: 0 %
Lymphocytes Relative: 21 %
Lymphs Abs: 1.4 10*3/uL (ref 0.7–4.0)
MCH: 29.8 pg (ref 26.0–34.0)
MCHC: 31.6 g/dL (ref 30.0–36.0)
MCV: 94.3 fL (ref 80.0–100.0)
Monocytes Absolute: 0.4 10*3/uL (ref 0.1–1.0)
Monocytes Relative: 7 %
Neutro Abs: 4.6 10*3/uL (ref 1.7–7.7)
Neutrophils Relative %: 68 %
Platelets: 191 10*3/uL (ref 150–400)
RBC: 3.69 MIL/uL — ABNORMAL LOW (ref 3.87–5.11)
RDW: 14.6 % (ref 11.5–15.5)
WBC: 6.6 10*3/uL (ref 4.0–10.5)
nRBC: 0 % (ref 0.0–0.2)

## 2021-10-05 LAB — FERRITIN: Ferritin: 118 ng/mL (ref 11–307)

## 2021-10-05 NOTE — ED Provider Notes (Signed)
[facility]    CSN: [id number] Arrival date & time: 10/05/21  1555     History   Chief Complaint Chief Complaint  Patient presents with   Abrasion    HPI Courtney Cline is a 88 y.o. female.  Presents with abrasion to the right arm.  Was putting the regulator on her oxygen tank and the regulator snagged a piece of her skin.  Reports bleeding that stopped on its own.  Very mild pain. Her son accompanies her and was worried about the large bruise forming around the abrasion. She does take Eliquis  Denies any falls, headache, shortness of breath, chest pain.  Denies injury anywhere else  Past Medical History:  Diagnosis Date   Allergic rhinitis    Arthritis    Breast calcification seen on mammogram    Right breast   Breast cancer (HCC)    COPD (chronic obstructive pulmonary disease) (HCC)    GERD (gastroesophageal reflux disease)    Hyperglycemia    Hyperlipidemia    Hypertension    Low back pain    On home oxygen therapy    all the time   Osteopenia    Peripheral edema    Shortness of breath    Subclavian artery stenosis, left (HCC)    Vitamin D deficiency    Wears dentures    top   Wears glasses    reading    Patient Active Problem List   Diagnosis Date Noted   Aortic stenosis 05/06/2021   Malnutrition of moderate degree 02/16/2021   Acute cystitis without hematuria    Persistent atrial fibrillation ([place]) 08/20/2019   COPD without exacerbation ([place]) 10/15/2018   Healthcare maintenance 10/15/2018   Physical deconditioning 10/15/2018   Sinusitis 69/62/9528   Diastolic CHF ([place]) 41/32/4401   Vitamin D deficiency    Allergic rhinitis 11/23/2015   Osteopenia with high risk of fracture 11/05/2015   Post-nasal drip 03/16/2015   Anemia of chronic disease 03/10/2015   Subclavian artery stenosis, left ([place]) 04/24/2014   Bilateral lower extremity edema 04/24/2014   Essential hypertension 04/24/2014   Hyperlipidemia 04/24/2014   Breast cancer  of upper-inner quadrant of right female breast ([place]) 11/19/2013   Chronic hypoxemic respiratory failure ([place]) 10/02/2013    Past Surgical History:  Procedure Laterality Date   BREAST BIOPSY     BREAST LUMPECTOMY     right 2015   BREAST LUMPECTOMY WITH RADIOACTIVE SEED LOCALIZATION Right 12/20/2013   Procedure: RIGHT BREAST LUMPECTOMY WITH RADIOACTIVE SEED LOCALIZATION;  Surgeon: Erroll Luna, MD;  Location: [facility];  Service: General;  Laterality: Right;   CAROTID DUPLEX  2014   CATARACT EXTRACTION     both eyes    OB History   No obstetric history on file.      Home Medications    Prior to Admission medications   Medication Sig Start Date End Date Taking? Authorizing Provider  acetaminophen (TYLENOL) 325 MG tablet Take 650 mg by mouth every 6 (six) hours as needed for mild pain or headache.     [provider]  apixaban (ELIQUIS) 2.5 MG TABS tablet Take 1 tablet (2.5 mg total) by mouth 2 (two) times daily. 02/19/21   Darliss Cheney, MD  Cholecalciferol (VITAMIN D) 2000 UNITS CAPS Take 2,000 Units by mouth daily.     [provider]  Cyanocobalamin (VITAMIN B 12 PO) Take 1 tablet by mouth daily.    [provider]  Fluticasone-Umeclidin-Vilant (TRELEGY ELLIPTA) 200-62.5-25 MCG/ACT AEPB  Inhale 1 puff into the lungs daily. 09/30/21   Margaretha Seeds, MD  furosemide (LASIX) 40 MG tablet Take 0.5 tablets (20 mg total) by mouth 2 (two) times daily. <PLEASE MAKE APPOINTMENT FOR REFILLS> 02/19/21 05/06/21  Darliss Cheney, MD  metoprolol tartrate (LOPRESSOR) 25 MG tablet Take 1 tablet (25 mg total) by mouth 2 (two) times daily. 07/17/19   Noemi Chapel P, DO  PROAIR HFA 108 (90 Base) MCG/ACT inhaler TAKE 2 PUFFS BY MOUTH EVERY 6 HOURS AS NEEDED FOR WHEEZE OR SHORTNESS OF BREATH Patient taking differently: Inhale 2 puffs into the lungs every 6 (six) hours as needed for wheezing or shortness of breath. 08/16/17   Juanito Doom, MD  rosuvastatin  (CRESTOR) 40 MG tablet Take 40 mg by mouth daily. 02/02/21   [provider]    Family History Family History  Problem Relation Age of Onset   Emphysema Mother    Emphysema Maternal Grandfather     Social History Social History   Tobacco Use   Smoking status: Former    Packs/day: 1.00    Years: 40.00    Total pack years: 40.00    Types: Cigarettes    Quit date: 02/14/1994    Years since quitting: 27.6   Smokeless tobacco: Never  Substance Use Topics   Alcohol use: No   Drug use: No     Allergies   Patient has no known allergies.   Review of Systems Review of Systems Per HPI  Physical Exam Triage Vital Signs ED Triage Vitals  Enc Vitals Group     BP 10/05/21 1711 (!) 147/49     Pulse Rate 10/05/21 1711 62     Resp --      Temp 10/05/21 1711 98 F (36.7 C)     Temp src --      SpO2 10/05/21 1711 98 %     Weight --      Height --      Head Circumference --      Peak Flow --      Pain Score 10/05/21 1709 0     Pain Loc --      Pain Edu? --      Excl. in [place]? --    No data found.  Updated Vital Signs BP (!) 147/49 (BP Location: Right Arm)   Pulse 62   Temp 98 F (36.7 C)   SpO2 98%    Physical Exam Constitutional:      Appearance: Normal appearance.  HENT:     Mouth/Throat:     Pharynx: Oropharynx is clear.  Eyes:     Conjunctiva/sclera: Conjunctivae normal.  Cardiovascular:     Rate and Rhythm: Normal rate and regular rhythm.     Pulses: Normal pulses.  Pulmonary:     Effort: Pulmonary effort is normal.     Comments: On oxygen chronically Skin:    Findings: Abrasion and bruising present.     Comments: Right dorsal forearm has an area of skin avulsed, bleeding controlled.  1 to 2 inch diameter of ecchymosis surrounding   Neurological:     Mental Status: She is alert and oriented to person, place, and time.     Comments: Radial pulse 2+.  Distal sensation intact.     UC Treatments / Results  Labs (all labs ordered are listed,  but only abnormal results are displayed) Labs Reviewed - No data to display  EKG  Radiology No results found.  Procedures Procedures  Medications Ordered in UC Medications - No data to display  Initial Impression / Assessment and Plan / UC Course  I have reviewed the triage vital signs and the nursing notes.  Pertinent labs & imaging results that were available during my care of the patient were reviewed by me and considered in my medical decision making (see chart for details).  Avulsed skin does not come back over the open area of wound.  Patient has very thin skin. Cleansed with disinfectant and sterile saline.  Applied antibiotic ointment and wrapped with nonstick bandage.  We discussed using antibiotic ointment twice daily, keeping the wound clean and changing dressing daily.  She and her son should keep an eye on the bruising and make sure it is not worsening.  Discussed with son that bruising occurred due to the blood thinner she takes. Return precautions discussed. Strict ED precautions. Patient agrees to plan  Final Clinical Impressions(s) / UC Diagnoses   Final diagnoses:  Abrasion of right upper extremity, initial encounter  Anticoagulated     Discharge Instructions      Apply antibiotic ointment to the area twice daily. You can change the dressing as needed, once daily would be fine.  Follow up with your primary care provider regarding your Eliquis and INR.  Please go to the emergency department if symptoms worsen.    ED Prescriptions   None    PDMP not reviewed this encounter.   Les Pou, [place] 10/05/21 1821

## 2021-10-05 NOTE — Discharge Instructions (Signed)
Apply antibiotic ointment to the area twice daily. You can change the dressing as needed, once daily would be fine.  Follow up with your primary care provider regarding your Eliquis and INR.  Please go to the emergency department if symptoms worsen.

## 2021-10-05 NOTE — Progress Notes (Signed)
[facility]   [phone] [phone]   Clinic Follow up Note   Patient Care Team: Leeroy Cha, MD as PCP - General (Internal Medicine) Lorretta Harp, MD as PCP - Cardiology (Cardiology) Juanito Doom, MD as Consulting Physician (Pulmonary Disease) Truitt Merle, MD as Consulting Physician (Hematology) Erroll Luna, MD as Consulting Physician (General Surgery)  Date of Service:  10/05/2021  CHIEF COMPLAINT: f/u of anemia, h/o breast cancer  CURRENT THERAPY:  Retacrit injection, starting 07/01/21  ASSESSMENT & PLAN:  Courtney Cline is a 88 y.o. female with   1. Normocytic anemia secondary to iron deficiency and chronic kidney disease -long standing history of low hgb secondary to CKD -she is on Eliquis for A.fib -endorses taking oral iron  -prior stool occult test was negative. -she recently presented to her PCP with nosebleeds. She was seen by Dr. Fredric Dine in ENT on 07/05/21. -we started her on retacrit injections on 07/01/21. She also receives IV Venofer for ferritin <100. She tolerates these well with no side effects.  -lab reviewed, hgb is up to 11 today. Given this, we will hold retacrit injection today, proceed with next dose in 2 weeks, then change to every 4 weeks given good response.   2. CKD, stage IV -creatinine has been elevated since at least 2018 -most recent cr improved to 1.43 on 06/24/21.   3. A.fib, COPD -she is oxygen-dependent since ~2015 -she is on Eliquis -followed by Drs. Loanne Drilling and Gwenlyn Found   4. H/o Stage IA Right Breast Cancer, ER+/PR+/Her2- -diagnosed in 10/2013, s/p right lumpectomy. Completed 5 years of anastrozole in 11/2018. -most recent mammogram 08/16/21 was negative.     PLAN: -cancel today's Retacrit injection -lab and Retacrit injection in 2 weeks, then every 4 weeks -IV Venofer if her ferritin less than 100, on different day of retacrit  -f/u in 14 weeks   No problem-specific  Assessment & Plan notes found for this encounter.   SUMMARY OF ONCOLOGIC HISTORY: Oncology History Overview Note  Breast cancer of upper-inner quadrant of right female breast   Staging form: Breast, AJCC 7th Edition     Clinical: Stage Unknown (T1c, NX, cM0) - Signed by Glean Salvo, MD on 11/19/2013       Staging comments: ER 100%+, PR 100%+, Ki-67 7%      Breast cancer of upper-inner quadrant of right female breast ([place])  12/06/2012 Imaging   Bone Density performed at [facility] physicians T score Lumbar spine -0.1 (-0.7 in 2012), Right neck femur -1.4 (-1.2 before), Left neck femur -1.4 (-1.5 before) and Interpretation is OSTEOPENIA by WHO criteria.    10/11/2013 Mammogram   Abnormal Screening mammogram showing Right Breast mass.   10/23/2013 Breast US   Diagnostic mammogram and US showed an irregular hypoechoic mass at 9'0 clock position right breast 8 cm from nipple. [place] measurement 1.2x0.7x1.1 cm, no axillary adenopathy.   10/23/2013 Initial Biopsy   US guided biopsy  done with clip placement.   10/23/2013 Pathology Results   Invasive ductal carcinoma (IDC), DCIS, invasive cancer grade 2, ER 100%+, PR 100%+, Ki-67% 7%, HER-2/NEU by CISH No amplification, ratio of her2:cep17 1.00, average her2 copy number per cell 1.95 (HER 2 Negative tumor). Molecular Classification LUMINAL A.   11/01/2013 Surgery   Initial surgical evaluation by Dr Marcello Moores Cornett: "Not good surgical candidate because of pulmonary status". Recommended anti-estrogen therapy.   11/18/2013 -  Anti-estrogen oral therapy   Anastrozole 1 mg once daily   12/20/2013  Surgery   right breast lumpectomy without SLN biopsy, negative margins    12/20/2013 Pathologic Stage   pT1cNxMx, G2, LVI (-), tumor measures 1.2cm. Surgical margins are negative.     10/18/2016 Imaging   MM DIAG BREAST TOMO BILATERAL 10/18/16 IMPRESSION: No mammographic evidence of malignancy involving either breast. Expected post lumpectomy changes in the  right breast.   11/08/2018 Mammogram   IMPRESSION: No evidence of malignancy in either breast. Lumpectomy changes on the right. RECOMMENDATION: Diagnostic mammogram is suggested in 1 year. (Code:DM-B-01Y)      INTERVAL HISTORY:  Courtney Cline is here for a follow up of anemia. She was last seen by me on 08/05/21. She presents to the clinic accompanied by her son. She reports she is doing well overall, no new concerns.   All other systems were reviewed with the patient and are negative.  MEDICAL HISTORY:  Past Medical History:  Diagnosis Date   Allergic rhinitis    Arthritis    Breast calcification seen on mammogram    Right breast   Breast cancer (HCC)    COPD (chronic obstructive pulmonary disease) (HCC)    GERD (gastroesophageal reflux disease)    Hyperglycemia    Hyperlipidemia    Hypertension    Low back pain    On home oxygen therapy    all the time   Osteopenia    Peripheral edema    Shortness of breath    Subclavian artery stenosis, left ([place])    Vitamin D deficiency    Wears dentures    top   Wears glasses    reading    SURGICAL HISTORY: Past Surgical History:  Procedure Laterality Date   BREAST BIOPSY     BREAST LUMPECTOMY     right 2015   BREAST LUMPECTOMY WITH RADIOACTIVE SEED LOCALIZATION Right 12/20/2013   Procedure: RIGHT BREAST LUMPECTOMY WITH RADIOACTIVE SEED LOCALIZATION;  Surgeon: Erroll Luna, MD;  Location: [facility];  Service: General;  Laterality: Right;   CAROTID DUPLEX  2014   CATARACT EXTRACTION     both eyes    I have reviewed the social history and family history with the patient and they are unchanged from previous note.  ALLERGIES:  has No Known Allergies.  MEDICATIONS:  Current Outpatient Medications  Medication Sig Dispense Refill   acetaminophen (TYLENOL) 325 MG tablet Take 650 mg by mouth every 6 (six) hours as needed for mild pain or headache.      apixaban (ELIQUIS) 2.5 MG TABS tablet Take 1  tablet (2.5 mg total) by mouth 2 (two) times daily. 60 tablet 0   Cholecalciferol (VITAMIN D) 2000 UNITS CAPS Take 2,000 Units by mouth daily.      Cyanocobalamin (VITAMIN B 12 PO) Take 1 tablet by mouth daily.     Fluticasone-Umeclidin-Vilant (TRELEGY ELLIPTA) 200-62.5-25 MCG/ACT AEPB Inhale 1 puff into the lungs daily. 60 each 5   furosemide (LASIX) 40 MG tablet Take 0.5 tablets (20 mg total) by mouth 2 (two) times daily. <PLEASE MAKE APPOINTMENT FOR REFILLS> 30 tablet 0   metoprolol tartrate (LOPRESSOR) 25 MG tablet Take 1 tablet (25 mg total) by mouth 2 (two) times daily. 60 tablet 0   PROAIR HFA 108 (90 Base) MCG/ACT inhaler TAKE 2 PUFFS BY MOUTH EVERY 6 HOURS AS NEEDED FOR WHEEZE OR SHORTNESS OF BREATH (Patient taking differently: Inhale 2 puffs into the lungs every 6 (six) hours as needed for wheezing or shortness of breath.) 8.5 Inhaler 11   rosuvastatin (  CRESTOR) 40 MG tablet Take 40 mg by mouth daily.     No current facility-administered medications for this visit.    PHYSICAL EXAMINATION: ECOG PERFORMANCE STATUS: 3 - Symptomatic, >50% confined to bed  Vitals:   10/05/21 1423  BP: (!) 143/48  Pulse: 66  Resp: 18  Temp: (!) 97.5 F (36.4 C)  SpO2: 100%   Wt Readings from Last 3 Encounters:  10/05/21 161 lb 4.8 oz (73.2 kg)  08/30/21 159 lb 12.8 oz (72.5 kg)  08/05/21 159 lb 3.2 oz (72.2 kg)     GENERAL:alert, no distress and comfortable SKIN: skin color normal, no rashes or significant lesions EYES: normal, Conjunctiva are pink and non-injected, sclera clear  NEURO: alert & oriented x 3 with fluent speech  LABORATORY DATA:  I have reviewed the data as listed    Latest Ref Rng & Units 10/05/2021    2:04 PM 09/22/2021    2:22 PM 09/16/2021    2:04 PM  CBC  WBC 4.0 - 10.5 K/uL 6.6  6.2  5.3   Hemoglobin 12.0 - 15.0 g/dL 11.0  10.3  10.1   Hematocrit 36.0 - 46.0 % 34.8  32.5  32.4   Platelets 150 - 400 K/uL 191  177  174         Latest Ref Rng & Units 06/24/2021     2:08 PM 02/19/2021    1:10 AM 02/18/2021   12:27 PM  CMP  Glucose 70 - 99 mg/dL 101  169  176   BUN 8 - 23 mg/dL 28  44  41   Creatinine 0.44 - 1.00 mg/dL 1.43  1.81  1.77   Sodium 135 - 145 mmol/L 141  141  139   Potassium 3.5 - 5.1 mmol/L 4.3  5.3  4.7   Chloride 98 - 111 mmol/L 102  111  111   CO2 22 - 32 mmol/L 32  24  22   Calcium 8.9 - 10.3 mg/dL 9.1  9.1  9.1   Total Protein 6.5 - 8.1 g/dL 7.1     Total Bilirubin 0.3 - 1.2 mg/dL 0.3     Alkaline Phos 38 - 126 U/L 57     AST 15 - 41 U/L 14     ALT 0 - 44 U/L 9         RADIOGRAPHIC STUDIES: I have personally reviewed the radiological images as listed and agreed with the findings in the report. No results found.    No orders of the defined types were placed in this encounter.  All questions were answered. The patient knows to call the clinic with any problems, questions or concerns. No barriers to learning was detected. The total time spent in the appointment was 20 minutes.     Truitt Merle, MD 10/05/2021   I, Wilburn Mylar, am acting as scribe for Truitt Merle, MD.   I have reviewed the above documentation for accuracy and completeness, and I agree with the above.

## 2021-10-05 NOTE — ED Triage Notes (Signed)
Pt present's w/ skin tear to outer aspect of right forearm, approx 1"x1" in size with bleeding controlled. Pt states she was putting the regulator on her home oxygen tank when her arm got snagged by the regulator.

## 2021-10-08 DIAGNOSIS — I700 Atherosclerosis of aorta: Secondary | ICD-10-CM | POA: Diagnosis not present

## 2021-10-08 DIAGNOSIS — I10 Essential (primary) hypertension: Secondary | ICD-10-CM | POA: Diagnosis not present

## 2021-10-08 DIAGNOSIS — J9611 Chronic respiratory failure with hypoxia: Secondary | ICD-10-CM | POA: Diagnosis not present

## 2021-10-08 DIAGNOSIS — N183 Chronic kidney disease, stage 3 unspecified: Secondary | ICD-10-CM | POA: Diagnosis not present

## 2021-10-08 DIAGNOSIS — J449 Chronic obstructive pulmonary disease, unspecified: Secondary | ICD-10-CM | POA: Diagnosis not present

## 2021-10-08 DIAGNOSIS — I480 Paroxysmal atrial fibrillation: Secondary | ICD-10-CM | POA: Diagnosis not present

## 2021-10-08 DIAGNOSIS — T148XXA Other injury of unspecified body region, initial encounter: Secondary | ICD-10-CM | POA: Diagnosis not present

## 2021-10-08 DIAGNOSIS — Z853 Personal history of malignant neoplasm of breast: Secondary | ICD-10-CM | POA: Diagnosis not present

## 2021-10-08 DIAGNOSIS — I5032 Chronic diastolic (congestive) heart failure: Secondary | ICD-10-CM | POA: Diagnosis not present

## 2021-10-19 ENCOUNTER — Inpatient Hospital Stay: Payer: Medicare HMO

## 2021-10-19 ENCOUNTER — Inpatient Hospital Stay: Payer: Medicare HMO | Attending: Hematology

## 2021-10-19 ENCOUNTER — Other Ambulatory Visit: Payer: Self-pay

## 2021-10-19 VITALS — BP 142/50 | HR 66 | Temp 98.1°F | Resp 18

## 2021-10-19 DIAGNOSIS — D500 Iron deficiency anemia secondary to blood loss (chronic): Secondary | ICD-10-CM

## 2021-10-19 DIAGNOSIS — D631 Anemia in chronic kidney disease: Secondary | ICD-10-CM | POA: Diagnosis not present

## 2021-10-19 DIAGNOSIS — N184 Chronic kidney disease, stage 4 (severe): Secondary | ICD-10-CM | POA: Insufficient documentation

## 2021-10-19 DIAGNOSIS — D638 Anemia in other chronic diseases classified elsewhere: Secondary | ICD-10-CM

## 2021-10-19 LAB — CBC WITH DIFFERENTIAL/PLATELET
Abs Immature Granulocytes: 0.01 10*3/uL (ref 0.00–0.07)
Basophils Absolute: 0 10*3/uL (ref 0.0–0.1)
Basophils Relative: 0 %
Eosinophils Absolute: 0.1 10*3/uL (ref 0.0–0.5)
Eosinophils Relative: 3 %
HCT: 34.7 % — ABNORMAL LOW (ref 36.0–46.0)
Hemoglobin: 10.9 g/dL — ABNORMAL LOW (ref 12.0–15.0)
Immature Granulocytes: 0 %
Lymphocytes Relative: 23 %
Lymphs Abs: 1.2 10*3/uL (ref 0.7–4.0)
MCH: 29.7 pg (ref 26.0–34.0)
MCHC: 31.4 g/dL (ref 30.0–36.0)
MCV: 94.6 fL (ref 80.0–100.0)
Monocytes Absolute: 0.4 10*3/uL (ref 0.1–1.0)
Monocytes Relative: 7 %
Neutro Abs: 3.4 10*3/uL (ref 1.7–7.7)
Neutrophils Relative %: 67 %
Platelets: 171 10*3/uL (ref 150–400)
RBC: 3.67 MIL/uL — ABNORMAL LOW (ref 3.87–5.11)
RDW: 14 % (ref 11.5–15.5)
WBC: 5.1 10*3/uL (ref 4.0–10.5)
nRBC: 0 % (ref 0.0–0.2)

## 2021-10-19 LAB — FERRITIN: Ferritin: 139 ng/mL (ref 11–307)

## 2021-10-19 MED ORDER — EPOETIN ALFA-EPBX 10000 UNIT/ML IJ SOLN
10000.0000 [IU] | Freq: Once | INTRAMUSCULAR | Status: AC
  Administered 2021-10-19: 10000 [IU] via SUBCUTANEOUS
  Filled 2021-10-19: qty 1

## 2021-11-16 ENCOUNTER — Inpatient Hospital Stay: Payer: Medicare HMO

## 2021-11-16 ENCOUNTER — Inpatient Hospital Stay: Payer: Medicare HMO | Attending: Hematology

## 2021-11-16 ENCOUNTER — Other Ambulatory Visit: Payer: Self-pay

## 2021-11-16 VITALS — BP 153/54 | HR 60 | Temp 98.6°F | Resp 18

## 2021-11-16 DIAGNOSIS — I129 Hypertensive chronic kidney disease with stage 1 through stage 4 chronic kidney disease, or unspecified chronic kidney disease: Secondary | ICD-10-CM | POA: Diagnosis not present

## 2021-11-16 DIAGNOSIS — C50211 Malignant neoplasm of upper-inner quadrant of right female breast: Secondary | ICD-10-CM

## 2021-11-16 DIAGNOSIS — Z853 Personal history of malignant neoplasm of breast: Secondary | ICD-10-CM | POA: Insufficient documentation

## 2021-11-16 DIAGNOSIS — N184 Chronic kidney disease, stage 4 (severe): Secondary | ICD-10-CM | POA: Diagnosis not present

## 2021-11-16 DIAGNOSIS — D509 Iron deficiency anemia, unspecified: Secondary | ICD-10-CM | POA: Diagnosis not present

## 2021-11-16 DIAGNOSIS — D500 Iron deficiency anemia secondary to blood loss (chronic): Secondary | ICD-10-CM

## 2021-11-16 DIAGNOSIS — Z79811 Long term (current) use of aromatase inhibitors: Secondary | ICD-10-CM | POA: Diagnosis not present

## 2021-11-16 DIAGNOSIS — D631 Anemia in chronic kidney disease: Secondary | ICD-10-CM | POA: Insufficient documentation

## 2021-11-16 DIAGNOSIS — D638 Anemia in other chronic diseases classified elsewhere: Secondary | ICD-10-CM

## 2021-11-16 LAB — IRON AND IRON BINDING CAPACITY (CC-WL,HP ONLY)
Iron: 71 ug/dL (ref 28–170)
Saturation Ratios: 25 % (ref 10.4–31.8)
TIBC: 281 ug/dL (ref 250–450)
UIBC: 210 ug/dL (ref 148–442)

## 2021-11-16 LAB — CBC WITH DIFFERENTIAL/PLATELET
Abs Immature Granulocytes: 0.02 10*3/uL (ref 0.00–0.07)
Basophils Absolute: 0 10*3/uL (ref 0.0–0.1)
Basophils Relative: 0 %
Eosinophils Absolute: 0.1 10*3/uL (ref 0.0–0.5)
Eosinophils Relative: 2 %
HCT: 32 % — ABNORMAL LOW (ref 36.0–46.0)
Hemoglobin: 10 g/dL — ABNORMAL LOW (ref 12.0–15.0)
Immature Granulocytes: 0 %
Lymphocytes Relative: 18 %
Lymphs Abs: 1 10*3/uL (ref 0.7–4.0)
MCH: 30 pg (ref 26.0–34.0)
MCHC: 31.3 g/dL (ref 30.0–36.0)
MCV: 96.1 fL (ref 80.0–100.0)
Monocytes Absolute: 0.4 10*3/uL (ref 0.1–1.0)
Monocytes Relative: 8 %
Neutro Abs: 3.9 10*3/uL (ref 1.7–7.7)
Neutrophils Relative %: 72 %
Platelets: 193 10*3/uL (ref 150–400)
RBC: 3.33 MIL/uL — ABNORMAL LOW (ref 3.87–5.11)
RDW: 13.9 % (ref 11.5–15.5)
WBC: 5.5 10*3/uL (ref 4.0–10.5)
nRBC: 0 % (ref 0.0–0.2)

## 2021-11-16 LAB — COMPREHENSIVE METABOLIC PANEL
ALT: 12 U/L (ref 0–44)
AST: 17 U/L (ref 15–41)
Albumin: 3.8 g/dL (ref 3.5–5.0)
Alkaline Phosphatase: 58 U/L (ref 38–126)
Anion gap: 4 — ABNORMAL LOW (ref 5–15)
BUN: 30 mg/dL — ABNORMAL HIGH (ref 8–23)
CO2: 35 mmol/L — ABNORMAL HIGH (ref 22–32)
Calcium: 9 mg/dL (ref 8.9–10.3)
Chloride: 103 mmol/L (ref 98–111)
Creatinine, Ser: 1.42 mg/dL — ABNORMAL HIGH (ref 0.44–1.00)
GFR, Estimated: 36 mL/min — ABNORMAL LOW (ref >60)
Glucose, Bld: 79 mg/dL (ref 70–99)
Potassium: 4.3 mmol/L (ref 3.5–5.1)
Sodium: 142 mmol/L (ref 135–145)
Total Bilirubin: 0.3 mg/dL (ref 0.3–1.2)
Total Protein: 6.6 g/dL (ref 6.5–8.1)

## 2021-11-16 MED ORDER — EPOETIN ALFA-EPBX 10000 UNIT/ML IJ SOLN
10000.0000 [IU] | Freq: Once | INTRAMUSCULAR | Status: AC
  Administered 2021-11-16: 10000 [IU] via SUBCUTANEOUS
  Filled 2021-11-16: qty 1

## 2021-11-17 LAB — FERRITIN: Ferritin: 152 ng/mL (ref 11–307)

## 2021-12-14 ENCOUNTER — Inpatient Hospital Stay: Payer: Medicare HMO

## 2021-12-23 ENCOUNTER — Emergency Department (HOSPITAL_COMMUNITY)
Admission: EM | Admit: 2021-12-23 | Discharge: 2021-12-24 | Disposition: A | Discharge status: Home / Self Care | Payer: Medicare HMO | Attending: Emergency Medicine | Admitting: Emergency Medicine

## 2021-12-23 ENCOUNTER — Encounter (HOSPITAL_COMMUNITY): Payer: Self-pay

## 2021-12-23 ENCOUNTER — Other Ambulatory Visit: Payer: Self-pay

## 2021-12-23 DIAGNOSIS — Z7901 Long term (current) use of anticoagulants: Secondary | ICD-10-CM | POA: Insufficient documentation

## 2021-12-23 DIAGNOSIS — Z7951 Long term (current) use of inhaled steroids: Secondary | ICD-10-CM | POA: Diagnosis not present

## 2021-12-23 DIAGNOSIS — R040 Epistaxis: Secondary | ICD-10-CM | POA: Insufficient documentation

## 2021-12-23 DIAGNOSIS — I10 Essential (primary) hypertension: Secondary | ICD-10-CM | POA: Insufficient documentation

## 2021-12-23 DIAGNOSIS — Z79899 Other long term (current) drug therapy: Secondary | ICD-10-CM | POA: Diagnosis not present

## 2021-12-23 DIAGNOSIS — J449 Chronic obstructive pulmonary disease, unspecified: Secondary | ICD-10-CM | POA: Insufficient documentation

## 2021-12-23 DIAGNOSIS — Z853 Personal history of malignant neoplasm of breast: Secondary | ICD-10-CM | POA: Insufficient documentation

## 2021-12-23 MED ORDER — OXYMETAZOLINE HCL 0.05 % NA SOLN
1.0000 | Freq: Once | NASAL | Status: AC
  Administered 2021-12-24: 1 via NASAL
  Filled 2021-12-23: qty 30

## 2021-12-23 NOTE — ED Triage Notes (Signed)
Pt has had nosebleed x 1.5hrs today. Pt states she had a nosebleed 4 days ago that took awhile to stop as well. Pt wears oxygen, does not have humidity on oxygen. Pt takes eliquis.

## 2021-12-23 NOTE — ED Provider Triage Note (Signed)
Emergency Medicine Provider Triage Evaluation Note  Courtney Cline , a 88 y.o. female  was evaluated in triage.  Pt complains of epistaxis x 2 hours. On eliquis. Had similar nosebleed three days ago and it finally resolved after several hours. On chronic oxygen without humidifier. Has appointment to see ENT tomorrow.   Review of Systems  Positive: epistaxis Negative:   Physical Exam  BP 98/61   Pulse 86   Temp 97.8 F (36.6 C) (Oral)   Resp 16   Ht 5' (1.524 m)   Wt 69.9 kg   SpO2 90%   BMI 30.08 kg/m  Gen:   Awake, no distress   Resp:  Normal effort  MSK:   Moves extremities without difficulty  Other:  Minimal bleeding in triage, but continues to dab nose with cloth and spit small amounts of blood into trash can  Medical Decision Making  Medically screening exam initiated at 8:19 PM.  Appropriate orders placed.  Courtney Cline was informed that the remainder of the evaluation will be completed by another provider, this initial triage assessment does not replace that evaluation, and the importance of remaining in the ED until their evaluation is complete.     Courtney Gathright T, PA-C 12/23/21 2020

## 2021-12-24 DIAGNOSIS — I5032 Chronic diastolic (congestive) heart failure: Secondary | ICD-10-CM | POA: Diagnosis not present

## 2021-12-24 DIAGNOSIS — I480 Paroxysmal atrial fibrillation: Secondary | ICD-10-CM | POA: Diagnosis not present

## 2021-12-24 DIAGNOSIS — I10 Essential (primary) hypertension: Secondary | ICD-10-CM | POA: Diagnosis not present

## 2021-12-24 DIAGNOSIS — E785 Hyperlipidemia, unspecified: Secondary | ICD-10-CM | POA: Diagnosis not present

## 2021-12-24 DIAGNOSIS — K219 Gastro-esophageal reflux disease without esophagitis: Secondary | ICD-10-CM | POA: Diagnosis not present

## 2021-12-24 DIAGNOSIS — D509 Iron deficiency anemia, unspecified: Secondary | ICD-10-CM | POA: Diagnosis not present

## 2021-12-24 DIAGNOSIS — N183 Chronic kidney disease, stage 3 unspecified: Secondary | ICD-10-CM | POA: Diagnosis not present

## 2021-12-24 DIAGNOSIS — J449 Chronic obstructive pulmonary disease, unspecified: Secondary | ICD-10-CM | POA: Diagnosis not present

## 2021-12-24 DIAGNOSIS — D500 Iron deficiency anemia secondary to blood loss (chronic): Secondary | ICD-10-CM | POA: Diagnosis not present

## 2021-12-24 LAB — TYPE AND SCREEN
ABO/RH(D): A POS
Antibody Screen: NEGATIVE

## 2021-12-24 LAB — BASIC METABOLIC PANEL
Anion gap: 14 (ref 5–15)
BUN: 22 mg/dL (ref 8–23)
CO2: 29 mmol/L (ref 22–32)
Calcium: 9.3 mg/dL (ref 8.9–10.3)
Chloride: 96 mmol/L — ABNORMAL LOW (ref 98–111)
Creatinine, Ser: 1.57 mg/dL — ABNORMAL HIGH (ref 0.44–1.00)
GFR, Estimated: 32 mL/min — ABNORMAL LOW (ref >60)
Glucose, Bld: 127 mg/dL — ABNORMAL HIGH (ref 70–99)
Potassium: 3.6 mmol/L (ref 3.5–5.1)
Sodium: 139 mmol/L (ref 135–145)

## 2021-12-24 LAB — CBC WITH DIFFERENTIAL/PLATELET
Abs Immature Granulocytes: 0.04 10*3/uL (ref 0.00–0.07)
Basophils Absolute: 0.1 10*3/uL (ref 0.0–0.1)
Basophils Relative: 1 %
Eosinophils Absolute: 0.5 10*3/uL (ref 0.0–0.5)
Eosinophils Relative: 6 %
HCT: 29.6 % — ABNORMAL LOW (ref 36.0–46.0)
Hemoglobin: 9.2 g/dL — ABNORMAL LOW (ref 12.0–15.0)
Immature Granulocytes: 1 %
Lymphocytes Relative: 18 %
Lymphs Abs: 1.5 10*3/uL (ref 0.7–4.0)
MCH: 30.2 pg (ref 26.0–34.0)
MCHC: 31.1 g/dL (ref 30.0–36.0)
MCV: 97 fL (ref 80.0–100.0)
Monocytes Absolute: 0.7 10*3/uL (ref 0.1–1.0)
Monocytes Relative: 9 %
Neutro Abs: 5.4 10*3/uL (ref 1.7–7.7)
Neutrophils Relative %: 65 %
Platelets: 278 10*3/uL (ref 150–400)
RBC: 3.05 MIL/uL — ABNORMAL LOW (ref 3.87–5.11)
RDW: 13.4 % (ref 11.5–15.5)
WBC: 8.2 10*3/uL (ref 4.0–10.5)
nRBC: 0 % (ref 0.0–0.2)

## 2021-12-24 NOTE — ED Notes (Signed)
Dr. Horton at bedside. 

## 2021-12-24 NOTE — Discharge Instructions (Signed)
You were seen today for nosebleed.  This is likely related to using chronic oxygen and being on Eliquis.  Try to humidify the air is much as possible.  You may use Vaseline and nasal saline to keep your mucous membranes moist.  Follow-up with the ENT.

## 2021-12-24 NOTE — ED Notes (Signed)
Pt nose is no longer bleeding

## 2021-12-24 NOTE — ED Notes (Signed)
Patient verbalizes understanding of discharge instructions. Opportunity for questioning and answers were provided. Armband removed by staff, pt discharged from ED via wheelchair.  

## 2021-12-24 NOTE — ED Provider Notes (Signed)
[facility] EMERGENCY DEPARTMENT Provider Note   CSN: [id number] Arrival date & time: 12/23/21  1922     History  Chief Complaint  Patient presents with   Epistaxis    Courtney Cline is a 88 y.o. female.  HPI     This is an 88 year old female who presents with epistaxis.  Patient reports that she developed epistaxis approximately 1 hour prior to arrival.  She has had nosebleeds recently but has been able to get them to stop that at home.  She does wear chronic oxygen and takes Eliquis.  She believes it is mostly out of her left naris.  Denies trauma.  Home Medications Prior to Admission medications   Medication Sig Start Date End Date Taking? Authorizing Provider  acetaminophen (TYLENOL) 325 MG tablet Take 650 mg by mouth every 6 (six) hours as needed for mild pain or headache.     [provider]  apixaban (ELIQUIS) 2.5 MG TABS tablet Take 1 tablet (2.5 mg total) by mouth 2 (two) times daily. 02/19/21   Darliss Cheney, MD  Cholecalciferol (VITAMIN D) 2000 UNITS CAPS Take 2,000 Units by mouth daily.     [provider]  Cyanocobalamin (VITAMIN B 12 PO) Take 1 tablet by mouth daily.    [provider]  Fluticasone-Umeclidin-Vilant (TRELEGY ELLIPTA) 200-62.5-25 MCG/ACT AEPB Inhale 1 puff into the lungs daily. 09/30/21   Margaretha Seeds, MD  furosemide (LASIX) 40 MG tablet Take 0.5 tablets (20 mg total) by mouth 2 (two) times daily. <PLEASE MAKE APPOINTMENT FOR REFILLS> 02/19/21 05/06/21  Darliss Cheney, MD  metoprolol tartrate (LOPRESSOR) 25 MG tablet Take 1 tablet (25 mg total) by mouth 2 (two) times daily. 07/17/19   Noemi Chapel P, DO  PROAIR HFA 108 (90 Base) MCG/ACT inhaler TAKE 2 PUFFS BY MOUTH EVERY 6 HOURS AS NEEDED FOR WHEEZE OR SHORTNESS OF BREATH Patient taking differently: Inhale 2 puffs into the lungs every 6 (six) hours as needed for wheezing or shortness of breath. 08/16/17   Juanito Doom, MD  rosuvastatin (CRESTOR) 40  MG tablet Take 40 mg by mouth daily. 02/02/21   [provider]      Allergies    Patient has no known allergies.    Review of Systems   Review of Systems  Constitutional:  Negative for fever.  HENT:  Positive for nosebleeds.   All other systems reviewed and are negative.   Physical Exam Updated Vital Signs BP (!) 147/57   Pulse 75   Temp 98 F (36.7 C) (Oral)   Resp 18   Ht 1.524 m (5')   Wt 69.9 kg   SpO2 99%   BMI 30.08 kg/m  Physical Exam Vitals and nursing note reviewed.  Constitutional:      Appearance: She is well-developed. She is not ill-appearing.  HENT:     Head: Normocephalic and atraumatic.     Nose:     Comments: Large clot noted in the left naris, after evacuation, significant septal friability noted of the anterior nasal septum, no active bleeding Eyes:     Pupils: Pupils are equal, round, and reactive to light.  Cardiovascular:     Rate and Rhythm: Normal rate and regular rhythm.  Pulmonary:     Effort: Pulmonary effort is normal. No respiratory distress.     Comments: Nasal cannula in place Abdominal:     Palpations: Abdomen is soft.  Musculoskeletal:     Cervical back: Neck supple.  Skin:  General: Skin is warm and dry.  Neurological:     Mental Status: She is alert and oriented to person, place, and time.  Psychiatric:        Mood and Affect: Mood normal.     ED Results / Procedures / Treatments   Labs (all labs ordered are listed, but only abnormal results are displayed) Labs Reviewed  CBC WITH DIFFERENTIAL/PLATELET - Abnormal; Notable for the following components:      Result Value   RBC 3.05 (*)    Hemoglobin 9.2 (*)    HCT 29.6 (*)    All other components within normal limits  BASIC METABOLIC PANEL - Abnormal; Notable for the following components:   Chloride 96 (*)    Glucose, Bld 127 (*)    Creatinine, Ser 1.57 (*)    GFR, Estimated 32 (*)    All other components within normal limits  TYPE AND SCREEN     EKG None  Radiology No results found.  Procedures .Epistaxis Management  Date/Time: 12/24/2021 3:37 AM  Performed by: Merryl Hacker, MD Authorized by: Merryl Hacker, MD   Consent:    Consent obtained:  Verbal   Consent given by:  Patient   Risks, benefits, and alternatives were discussed: yes     Risks discussed:  Bleeding, infection and nasal injury   Alternatives discussed:  No treatment Universal protocol:    Patient identity confirmed:  Verbally with patient Anesthesia:    Anesthesia method:  None Procedure details:    Treatment site:  L anterior   Treatment method:  Silver nitrate   Treatment complexity:  Limited   Treatment episode: initial   Post-procedure details:    Assessment:  Bleeding stopped   Procedure completion:  Tolerated     Medications Ordered in ED Medications  oxymetazoline (AFRIN) 0.05 % nasal spray 1 spray (1 spray Each Nare Given 12/24/21 0223)    ED Course/ Medical Decision Making/ A&P                           Medical Decision Making  This patient presents to the ED for concern of epistaxis, this involves an extensive number of treatment options, and is a complaint that carries with it a high risk of complications and morbidity.  I considered the following differential and admission for this acute, potentially life threatening condition.  The differential diagnosis includes epistaxis secondary to dry mucous membranes, septal abnormality, coagulopathy  MDM:    This is an 88 year old female who presents with left anterior nosebleed.  She has notable clot in the left naris.  This was evacuated.  Afrin was instilled.  Patient had significant septal friability.  Silver nitrate was applied.  Patient was monitored closely.  No recurrent bleeding noted.  Labs obtained from triage reviewed and shows no significant change.  Hemoglobin is 9.2.  Baseline 10.0.  Patient is not lightheaded and has no complaints at this time.  We discussed  using humidified oxygen and nasal saline.  Follow-up with ENT  (Labs, imaging, consults)  Labs: I Ordered, and personally interpreted labs.  The pertinent results include: CBC, BMP  Imaging Studies ordered: I ordered imaging studies including none I independently visualized and interpreted imaging. I agree with the radiologist interpretation  Additional history obtained from son at bedside.  External records from outside source obtained and reviewed including prior evaluations  Cardiac Monitoring: The patient was maintained on a cardiac monitor.  I personally  viewed and interpreted the cardiac monitored which showed an underlying rhythm of: Sinus rhythm  Reevaluation: After the interventions noted above, I reevaluated the patient and found that they have :resolved  Social Determinants of Health:  lives independently  Disposition: Discharge  Co morbidities that complicate the patient evaluation  Past Medical History:  Diagnosis Date   Allergic rhinitis    Arthritis    Breast calcification seen on mammogram    Right breast   Breast cancer ([place])    COPD (chronic obstructive pulmonary disease) (HCC)    GERD (gastroesophageal reflux disease)    Hyperglycemia    Hyperlipidemia    Hypertension    Low back pain    On home oxygen therapy    all the time   Osteopenia    Peripheral edema    Shortness of breath    Subclavian artery stenosis, left (HCC)    Vitamin D deficiency    Wears dentures    top   Wears glasses    reading     Medicines Meds ordered this encounter  Medications   oxymetazoline (AFRIN) 0.05 % nasal spray 1 spray    I have reviewed the patients home medicines and have made adjustments as needed  Problem List / ED Course: Problem List Items Addressed This Visit   None Visit Diagnoses     Epistaxis    -  Primary                   Final Clinical Impression(s) / ED Diagnoses Final diagnoses:  Epistaxis    Rx / DC Orders ED  Discharge Orders     None         Merryl Hacker, MD 12/24/21 [id number]

## 2021-12-24 NOTE — ED Notes (Signed)
ENT and afrin cart at bedside

## 2021-12-24 NOTE — ED Notes (Signed)
Pt son stated his mother is now feeling light headed. Triage nurse, Gretta Cool, RN) notified.

## 2021-12-24 NOTE — ED Notes (Signed)
Patient reevaled in triage, new orders for bloodwork ordered and processed.

## 2021-12-29 DIAGNOSIS — J3489 Other specified disorders of nose and nasal sinuses: Secondary | ICD-10-CM | POA: Diagnosis not present

## 2021-12-29 DIAGNOSIS — R040 Epistaxis: Secondary | ICD-10-CM | POA: Diagnosis not present

## 2021-12-29 DIAGNOSIS — Z7901 Long term (current) use of anticoagulants: Secondary | ICD-10-CM | POA: Diagnosis not present

## 2022-01-02 ENCOUNTER — Encounter (HOSPITAL_COMMUNITY): Payer: Self-pay | Admitting: Emergency Medicine

## 2022-01-02 ENCOUNTER — Emergency Department (HOSPITAL_COMMUNITY): Payer: Medicare HMO

## 2022-01-02 ENCOUNTER — Other Ambulatory Visit: Payer: Self-pay

## 2022-01-02 ENCOUNTER — Inpatient Hospital Stay (HOSPITAL_COMMUNITY)
Admission: EM | Admit: 2022-01-02 | Discharge: 2022-01-05 | DRG: 871 | Disposition: A | Discharge status: Home Health | Payer: Medicare HMO | Attending: Internal Medicine | Admitting: Internal Medicine

## 2022-01-02 DIAGNOSIS — Z79899 Other long term (current) drug therapy: Secondary | ICD-10-CM | POA: Diagnosis not present

## 2022-01-02 DIAGNOSIS — A419 Sepsis, unspecified organism: Secondary | ICD-10-CM | POA: Diagnosis not present

## 2022-01-02 DIAGNOSIS — R6520 Severe sepsis without septic shock: Secondary | ICD-10-CM

## 2022-01-02 DIAGNOSIS — Z66 Do not resuscitate: Secondary | ICD-10-CM | POA: Diagnosis not present

## 2022-01-02 DIAGNOSIS — E785 Hyperlipidemia, unspecified: Secondary | ICD-10-CM | POA: Diagnosis present

## 2022-01-02 DIAGNOSIS — E559 Vitamin D deficiency, unspecified: Secondary | ICD-10-CM | POA: Diagnosis present

## 2022-01-02 DIAGNOSIS — E872 Acidosis, unspecified: Secondary | ICD-10-CM | POA: Diagnosis not present

## 2022-01-02 DIAGNOSIS — I10 Essential (primary) hypertension: Secondary | ICD-10-CM | POA: Diagnosis present

## 2022-01-02 DIAGNOSIS — E669 Obesity, unspecified: Secondary | ICD-10-CM | POA: Diagnosis present

## 2022-01-02 DIAGNOSIS — R7989 Other specified abnormal findings of blood chemistry: Secondary | ICD-10-CM | POA: Insufficient documentation

## 2022-01-02 DIAGNOSIS — Z9981 Dependence on supplemental oxygen: Secondary | ICD-10-CM | POA: Diagnosis not present

## 2022-01-02 DIAGNOSIS — N179 Acute kidney failure, unspecified: Secondary | ICD-10-CM | POA: Diagnosis present

## 2022-01-02 DIAGNOSIS — J439 Emphysema, unspecified: Secondary | ICD-10-CM | POA: Diagnosis present

## 2022-01-02 DIAGNOSIS — E782 Mixed hyperlipidemia: Secondary | ICD-10-CM

## 2022-01-02 DIAGNOSIS — Z6830 Body mass index (BMI) 30.0-30.9, adult: Secondary | ICD-10-CM | POA: Diagnosis not present

## 2022-01-02 DIAGNOSIS — M1990 Unspecified osteoarthritis, unspecified site: Secondary | ICD-10-CM | POA: Diagnosis present

## 2022-01-02 DIAGNOSIS — Z7901 Long term (current) use of anticoagulants: Secondary | ICD-10-CM | POA: Diagnosis not present

## 2022-01-02 DIAGNOSIS — J189 Pneumonia, unspecified organism: Secondary | ICD-10-CM | POA: Diagnosis not present

## 2022-01-02 DIAGNOSIS — I2489 Other forms of acute ischemic heart disease: Secondary | ICD-10-CM | POA: Diagnosis present

## 2022-01-02 DIAGNOSIS — Z825 Family history of asthma and other chronic lower respiratory diseases: Secondary | ICD-10-CM

## 2022-01-02 DIAGNOSIS — I959 Hypotension, unspecified: Secondary | ICD-10-CM | POA: Diagnosis not present

## 2022-01-02 DIAGNOSIS — Z1152 Encounter for screening for COVID-19: Secondary | ICD-10-CM | POA: Diagnosis not present

## 2022-01-02 DIAGNOSIS — N184 Chronic kidney disease, stage 4 (severe): Secondary | ICD-10-CM | POA: Diagnosis not present

## 2022-01-02 DIAGNOSIS — M8580 Other specified disorders of bone density and structure, unspecified site: Secondary | ICD-10-CM | POA: Diagnosis present

## 2022-01-02 DIAGNOSIS — I129 Hypertensive chronic kidney disease with stage 1 through stage 4 chronic kidney disease, or unspecified chronic kidney disease: Secondary | ICD-10-CM | POA: Diagnosis present

## 2022-01-02 DIAGNOSIS — E8809 Other disorders of plasma-protein metabolism, not elsewhere classified: Secondary | ICD-10-CM | POA: Diagnosis not present

## 2022-01-02 DIAGNOSIS — R000 Tachycardia, unspecified: Secondary | ICD-10-CM | POA: Diagnosis not present

## 2022-01-02 DIAGNOSIS — D631 Anemia in chronic kidney disease: Secondary | ICD-10-CM | POA: Diagnosis present

## 2022-01-02 DIAGNOSIS — J440 Chronic obstructive pulmonary disease with acute lower respiratory infection: Secondary | ICD-10-CM | POA: Diagnosis present

## 2022-01-02 DIAGNOSIS — R509 Fever, unspecified: Secondary | ICD-10-CM | POA: Diagnosis not present

## 2022-01-02 DIAGNOSIS — I4819 Other persistent atrial fibrillation: Secondary | ICD-10-CM | POA: Diagnosis not present

## 2022-01-02 DIAGNOSIS — Z853 Personal history of malignant neoplasm of breast: Secondary | ICD-10-CM

## 2022-01-02 DIAGNOSIS — J9611 Chronic respiratory failure with hypoxia: Secondary | ICD-10-CM | POA: Diagnosis not present

## 2022-01-02 DIAGNOSIS — J90 Pleural effusion, not elsewhere classified: Secondary | ICD-10-CM | POA: Diagnosis not present

## 2022-01-02 DIAGNOSIS — J9601 Acute respiratory failure with hypoxia: Secondary | ICD-10-CM

## 2022-01-02 DIAGNOSIS — Z7951 Long term (current) use of inhaled steroids: Secondary | ICD-10-CM

## 2022-01-02 DIAGNOSIS — Z87891 Personal history of nicotine dependence: Secondary | ICD-10-CM

## 2022-01-02 DIAGNOSIS — J449 Chronic obstructive pulmonary disease, unspecified: Secondary | ICD-10-CM | POA: Diagnosis not present

## 2022-01-02 DIAGNOSIS — R0602 Shortness of breath: Secondary | ICD-10-CM | POA: Diagnosis not present

## 2022-01-02 DIAGNOSIS — J9621 Acute and chronic respiratory failure with hypoxia: Secondary | ICD-10-CM | POA: Diagnosis not present

## 2022-01-02 DIAGNOSIS — R0902 Hypoxemia: Secondary | ICD-10-CM | POA: Diagnosis not present

## 2022-01-02 LAB — PROTIME-INR
INR: 1.3 — ABNORMAL HIGH (ref 0.8–1.2)
Prothrombin Time: 15.7 seconds — ABNORMAL HIGH (ref 11.4–15.2)

## 2022-01-02 LAB — I-STAT VENOUS BLOOD GAS, ED
Acid-Base Excess: 1 mmol/L (ref 0.0–2.0)
Bicarbonate: 25.6 mmol/L (ref 20.0–28.0)
Calcium, Ion: 1.18 mmol/L (ref 1.15–1.40)
HCT: 27 % — ABNORMAL LOW (ref 36.0–46.0)
Hemoglobin: 9.2 g/dL — ABNORMAL LOW (ref 12.0–15.0)
O2 Saturation: 96 %
Potassium: 3.8 mmol/L (ref 3.5–5.1)
Sodium: 135 mmol/L (ref 135–145)
TCO2: 27 mmol/L (ref 22–32)
pCO2, Ven: 41.3 mmHg — ABNORMAL LOW (ref 44–60)
pH, Ven: 7.4 (ref 7.25–7.43)
pO2, Ven: 79 mmHg — ABNORMAL HIGH (ref 32–45)

## 2022-01-02 LAB — APTT: aPTT: 36 seconds (ref 24–36)

## 2022-01-02 LAB — COMPREHENSIVE METABOLIC PANEL
ALT: 9 U/L (ref 0–44)
AST: 17 U/L (ref 15–41)
Albumin: 2.9 g/dL — ABNORMAL LOW (ref 3.5–5.0)
Alkaline Phosphatase: 57 U/L (ref 38–126)
Anion gap: 12 (ref 5–15)
BUN: 23 mg/dL (ref 8–23)
CO2: 24 mmol/L (ref 22–32)
Calcium: 9 mg/dL (ref 8.9–10.3)
Chloride: 100 mmol/L (ref 98–111)
Creatinine, Ser: 1.69 mg/dL — ABNORMAL HIGH (ref 0.44–1.00)
GFR, Estimated: 29 mL/min — ABNORMAL LOW (ref >60)
Glucose, Bld: 167 mg/dL — ABNORMAL HIGH (ref 70–99)
Potassium: 3.8 mmol/L (ref 3.5–5.1)
Sodium: 136 mmol/L (ref 135–145)
Total Bilirubin: 0.8 mg/dL (ref 0.3–1.2)
Total Protein: 6.5 g/dL (ref 6.5–8.1)

## 2022-01-02 LAB — CBC WITH DIFFERENTIAL/PLATELET
Abs Immature Granulocytes: 0.04 10*3/uL (ref 0.00–0.07)
Basophils Absolute: 0 10*3/uL (ref 0.0–0.1)
Basophils Relative: 0 %
Eosinophils Absolute: 0 10*3/uL (ref 0.0–0.5)
Eosinophils Relative: 0 %
HCT: 29.6 % — ABNORMAL LOW (ref 36.0–46.0)
Hemoglobin: 9.1 g/dL — ABNORMAL LOW (ref 12.0–15.0)
Immature Granulocytes: 0 %
Lymphocytes Relative: 9 %
Lymphs Abs: 1 10*3/uL (ref 0.7–4.0)
MCH: 30.3 pg (ref 26.0–34.0)
MCHC: 30.7 g/dL (ref 30.0–36.0)
MCV: 98.7 fL (ref 80.0–100.0)
Monocytes Absolute: 0.5 10*3/uL (ref 0.1–1.0)
Monocytes Relative: 5 %
Neutro Abs: 9 10*3/uL — ABNORMAL HIGH (ref 1.7–7.7)
Neutrophils Relative %: 86 %
Platelets: 215 10*3/uL (ref 150–400)
RBC: 3 MIL/uL — ABNORMAL LOW (ref 3.87–5.11)
RDW: 14.3 % (ref 11.5–15.5)
WBC: 10.6 10*3/uL — ABNORMAL HIGH (ref 4.0–10.5)
nRBC: 0 % (ref 0.0–0.2)

## 2022-01-02 LAB — RESP PANEL BY RT-PCR (FLU A&B, COVID) ARPGX2
Influenza A by PCR: NEGATIVE
Influenza B by PCR: NEGATIVE
SARS Coronavirus 2 by RT PCR: NEGATIVE

## 2022-01-02 LAB — LACTIC ACID, PLASMA
Lactic Acid, Venous: 2.1 mmol/L (ref 0.5–1.9)
Lactic Acid, Venous: 1.7 mmol/L (ref 0.5–1.9)

## 2022-01-02 LAB — TROPONIN I (HIGH SENSITIVITY)
Troponin I (High Sensitivity): 54 ng/L — ABNORMAL HIGH (ref <18)
Troponin I (High Sensitivity): 49 ng/L — ABNORMAL HIGH (ref <18)

## 2022-01-02 MED ORDER — ROSUVASTATIN CALCIUM 20 MG PO TABS
40.0000 mg | ORAL_TABLET | Freq: Every day | ORAL | Status: DC
  Administered 2022-01-03 – 2022-01-05 (×3): 40 mg via ORAL
  Filled 2022-01-02 (×3): qty 2

## 2022-01-02 MED ORDER — VITAMIN D 25 MCG (1000 UNIT) PO TABS
2000.0000 [IU] | ORAL_TABLET | Freq: Every day | ORAL | Status: DC
  Administered 2022-01-03 – 2022-01-05 (×3): 2000 [IU] via ORAL
  Filled 2022-01-02 (×3): qty 2

## 2022-01-02 MED ORDER — ACETAMINOPHEN 325 MG PO TABS
650.0000 mg | ORAL_TABLET | Freq: Once | ORAL | Status: AC
  Administered 2022-01-02: 650 mg via ORAL
  Filled 2022-01-02: qty 2

## 2022-01-02 MED ORDER — ACETAMINOPHEN 325 MG PO TABS: 650.0000 mg | ORAL_TABLET | Freq: Four times a day (QID) | ORAL | Status: DC | PRN

## 2022-01-02 MED ORDER — ALBUTEROL SULFATE (2.5 MG/3ML) 0.083% IN NEBU: 3.0000 mL | INHALATION_SOLUTION | Freq: Four times a day (QID) | RESPIRATORY_TRACT | Status: DC | PRN

## 2022-01-02 MED ORDER — FLUTICASONE FUROATE-VILANTEROL 200-25 MCG/ACT IN AEPB
1.0000 | INHALATION_SPRAY | Freq: Every day | RESPIRATORY_TRACT | Status: DC
  Administered 2022-01-03 – 2022-01-05 (×3): 1 via RESPIRATORY_TRACT
  Filled 2022-01-02: qty 28

## 2022-01-02 MED ORDER — METOPROLOL TARTRATE 25 MG PO TABS
25.0000 mg | ORAL_TABLET | Freq: Two times a day (BID) | ORAL | Status: DC
  Administered 2022-01-02 – 2022-01-05 (×5): 25 mg via ORAL
  Filled 2022-01-02 (×6): qty 1

## 2022-01-02 MED ORDER — UMECLIDINIUM BROMIDE 62.5 MCG/ACT IN AEPB
1.0000 | INHALATION_SPRAY | Freq: Every day | RESPIRATORY_TRACT | Status: DC
  Administered 2022-01-03 – 2022-01-05 (×3): 1 via RESPIRATORY_TRACT
  Filled 2022-01-02: qty 7

## 2022-01-02 MED ORDER — LACTATED RINGERS IV BOLUS (SEPSIS)
1000.0000 mL | Freq: Once | INTRAVENOUS | Status: AC
  Administered 2022-01-02: 1000 mL via INTRAVENOUS

## 2022-01-02 MED ORDER — APIXABAN 2.5 MG PO TABS
2.5000 mg | ORAL_TABLET | Freq: Two times a day (BID) | ORAL | Status: DC
  Administered 2022-01-02 – 2022-01-05 (×6): 2.5 mg via ORAL
  Filled 2022-01-02 (×6): qty 1

## 2022-01-02 MED ORDER — LACTATED RINGERS IV SOLN: INTRAVENOUS | Status: AC

## 2022-01-02 MED ORDER — SODIUM CHLORIDE 0.9 % IV SOLN
2.0000 g | INTRAVENOUS | Status: DC
  Administered 2022-01-02 – 2022-01-04 (×3): 2 g via INTRAVENOUS
  Filled 2022-01-02 (×3): qty 20

## 2022-01-02 MED ORDER — SODIUM CHLORIDE 0.9 % IV SOLN
500.0000 mg | INTRAVENOUS | Status: DC
  Administered 2022-01-02 – 2022-01-04 (×3): 500 mg via INTRAVENOUS
  Filled 2022-01-02 (×3): qty 5

## 2022-01-02 NOTE — ED Triage Notes (Signed)
Patient arrived via [facility] from home with complaints of SHOB, fever and weakness for 2 days. Per EMS, initial Spo-85% on room air (3L [place] O2 at baseline). EMS given '125mg'$  solumederol and douneb x1.  Per EMS: BP-160/50 HR-130 RR-30 Spo-97% on 3L [place] O2

## 2022-01-02 NOTE — Assessment & Plan Note (Signed)
-   Secondary to community-acquired pneumonia - Presented with fever, tachycardia and leukocytosis with findings of bilateral opacities seen on chest x-ray - Continue treatment with IV Rocephin and azithromycin

## 2022-01-02 NOTE — ED Notes (Signed)
Hospitalist at bedside 

## 2022-01-02 NOTE — Assessment & Plan Note (Signed)
Continue home Eliquis, beta-blocker

## 2022-01-02 NOTE — Assessment & Plan Note (Signed)
Troponin elevated to 54.  Suspect likely demand ischemia from sepsis.

## 2022-01-02 NOTE — ED Provider Notes (Signed)
[facility] EMERGENCY DEPARTMENT Provider Note   CSN: [id number] Arrival date & time: 01/02/22  1830     History  Chief Complaint  Patient presents with   Shortness of [place] is a 88 y.o. female.  Patient as above with significant medical history as below, including COPD on 3L [place], HLD, HTN  who presents to the ED with complaint of cough, dib, fever, unwell. Symptom onset 2 days ago, coughing with green/Khadim Lundberg sputum, fevers/chills at home, myalgias, dib, poor appetite. Symptoms worsened today so called EMS. On EMS arrival pt was hypoxic 85% on 3L[place], given duoneb and solumedrol en route with improvement to respiratory status. On arrival pt is breathing well on 3.5 L [place]. She denies recent travel, reports good compliance with home medications, no sick contacts.      Past Medical History:  Diagnosis Date   Allergic rhinitis    Arthritis    Breast calcification seen on mammogram    Right breast   Breast cancer (HCC)    COPD (chronic obstructive pulmonary disease) (HCC)    GERD (gastroesophageal reflux disease)    Hyperglycemia    Hyperlipidemia    Hypertension    Low back pain    On home oxygen therapy    all the time   Osteopenia    Peripheral edema    Shortness of breath    Subclavian artery stenosis, left ([place])    Vitamin D deficiency    Wears dentures    top   Wears glasses    reading    Past Surgical History:  Procedure Laterality Date   BREAST BIOPSY     BREAST LUMPECTOMY     right 2015   BREAST LUMPECTOMY WITH RADIOACTIVE SEED LOCALIZATION Right 12/20/2013   Procedure: RIGHT BREAST LUMPECTOMY WITH RADIOACTIVE SEED LOCALIZATION;  Surgeon: Erroll Luna, MD;  Location: [place];  Service: General;  Laterality: Right;   CAROTID DUPLEX  2014   CATARACT EXTRACTION     both eyes      The history is provided by the patient and the EMS personnel. No language interpreter was used.  Shortness of  Breath Associated symptoms: cough and fever   Associated symptoms: no abdominal pain, no chest pain, no headaches and no rash        Home Medications Prior to Admission medications   Medication Sig Start Date End Date Taking? Authorizing Provider  acetaminophen (TYLENOL) 325 MG tablet Take 650 mg by mouth every 6 (six) hours as needed for mild pain or headache.     [provider]  albuterol (PROVENTIL) (2.5 MG/3ML) 0.083% nebulizer solution Take 2.5 mg by nebulization every 6 (six) hours as needed for wheezing or shortness of breath. 01/02/22   [provider]  apixaban (ELIQUIS) 2.5 MG TABS tablet Take 1 tablet (2.5 mg total) by mouth 2 (two) times daily. 02/19/21   Darliss Cheney, MD  Cholecalciferol (VITAMIN D) 2000 UNITS CAPS Take 2,000 Units by mouth daily.     [provider]  Cyanocobalamin (VITAMIN B 12 PO) Take 1 tablet by mouth daily.    [provider]  Fluticasone-Umeclidin-Vilant (TRELEGY ELLIPTA) 200-62.5-25 MCG/ACT AEPB Inhale 1 puff into the lungs daily. 09/30/21   Margaretha Seeds, MD  furosemide (LASIX) 40 MG tablet Take 0.5 tablets (20 mg total) by mouth 2 (two) times daily. <PLEASE MAKE APPOINTMENT FOR REFILLS> 02/19/21 05/06/21  Darliss Cheney, MD  metoprolol tartrate (LOPRESSOR) 25 MG tablet  Take 1 tablet (25 mg total) by mouth 2 (two) times daily. 07/17/19   Noemi Chapel P, DO  PROAIR HFA 108 (90 Base) MCG/ACT inhaler TAKE 2 PUFFS BY MOUTH EVERY 6 HOURS AS NEEDED FOR WHEEZE OR SHORTNESS OF BREATH Patient taking differently: Inhale 2 puffs into the lungs every 6 (six) hours as needed for wheezing or shortness of breath. 08/16/17   Juanito Doom, MD  rosuvastatin (CRESTOR) 40 MG tablet Take 40 mg by mouth daily. 02/02/21   [provider]      Allergies    Patient has no known allergies.    Review of Systems   Review of Systems  Constitutional:  Positive for appetite change, chills and fever. Negative for activity change.   HENT:  Negative for facial swelling and trouble swallowing.   Eyes:  Negative for discharge and redness.  Respiratory:  Positive for cough, chest tightness and shortness of breath.   Cardiovascular:  Negative for chest pain and palpitations.  Gastrointestinal:  Negative for abdominal pain and nausea.  Genitourinary:  Negative for dysuria and flank pain.  Musculoskeletal:  Negative for back pain and gait problem.  Skin:  Negative for pallor and rash.  Neurological:  Negative for syncope and headaches.    Physical Exam Updated Vital Signs BP (!) 136/54   Pulse (!) 108   Temp (!) 101.5 F (38.6 C) (Rectal)   Resp (!) 24   Ht 5' (1.524 m)   Wt 70.3 kg   SpO2 96%   BMI 30.27 kg/m  Physical Exam Vitals and nursing note reviewed.  Constitutional:      General: She is not in acute distress.    Appearance: Normal appearance. She is well-developed.     Comments: feverish  HENT:     Head: Normocephalic and atraumatic.     Right Ear: External ear normal.     Left Ear: External ear normal.     Nose: Nose normal.     Mouth/Throat:     Mouth: Mucous membranes are moist.  Eyes:     General: No scleral icterus.       Right eye: No discharge.        Left eye: No discharge.  Cardiovascular:     Rate and Rhythm: Normal rate and regular rhythm.     Pulses: Normal pulses.     Heart sounds: Normal heart sounds.  Pulmonary:     Effort: Pulmonary effort is normal. Tachypnea present. No respiratory distress.     Breath sounds: Decreased air movement present. Decreased breath sounds and wheezing present.  Abdominal:     General: Abdomen is flat.     Palpations: Abdomen is soft.     Tenderness: There is no abdominal tenderness.  Musculoskeletal:        General: Normal range of motion.     Cervical back: Full passive range of motion without pain and normal range of motion.     Right lower leg: No edema.     Left lower leg: No edema.  Skin:    General: Skin is warm and dry.      Capillary Refill: Capillary refill takes less than 2 seconds.  Neurological:     Mental Status: She is alert and oriented to person, place, and time.     GCS: GCS eye subscore is 4. GCS verbal subscore is 5. GCS motor subscore is 6.  Psychiatric:        Mood and Affect: Mood normal.  Behavior: Behavior normal.     ED Results / Procedures / Treatments   Labs (all labs ordered are listed, but only abnormal results are displayed) Labs Reviewed  LACTIC ACID, PLASMA - Abnormal; Notable for the following components:      Result Value   Lactic Acid, Venous 2.1 (*)    All other components within normal limits  COMPREHENSIVE METABOLIC PANEL - Abnormal; Notable for the following components:   Glucose, Bld 167 (*)    Creatinine, Ser 1.69 (*)    Albumin 2.9 (*)    GFR, Estimated 29 (*)    All other components within normal limits  CBC WITH DIFFERENTIAL/PLATELET - Abnormal; Notable for the following components:   WBC 10.6 (*)    RBC 3.00 (*)    Hemoglobin 9.1 (*)    HCT 29.6 (*)    Neutro Abs 9.0 (*)    All other components within normal limits  PROTIME-INR - Abnormal; Notable for the following components:   Prothrombin Time 15.7 (*)    INR 1.3 (*)    All other components within normal limits  I-STAT VENOUS BLOOD GAS, ED - Abnormal; Notable for the following components:   pCO2, Ven 41.3 (*)    pO2, Ven 79 (*)    HCT 27.0 (*)    Hemoglobin 9.2 (*)    All other components within normal limits  TROPONIN I (HIGH SENSITIVITY) - Abnormal; Notable for the following components:   Troponin I (High Sensitivity) 54 (*)    All other components within normal limits  RESP PANEL BY RT-PCR (FLU A&B, COVID) ARPGX2  CULTURE, BLOOD (ROUTINE X 2)  CULTURE, BLOOD (ROUTINE X 2)  APTT  LACTIC ACID, PLASMA  URINALYSIS, ROUTINE W REFLEX MICROSCOPIC  TROPONIN I (HIGH SENSITIVITY)    EKG EKG Interpretation  Date/Time:  Sunday January 02 2022 18:42:59 EST Ventricular Rate:  116 PR  Interval:  174 QRS Duration: 98 QT Interval:  307 QTC Calculation: 427 R Axis:   36 Text Interpretation: Sinus tachycardia Borderline repolarization abnormality rate increased from prior no stemi Confirmed by Wynona Dove ([id number]) on 01/02/2022 7:18:16 PM  Radiology DG Chest Port 1 View  Result Date: 01/02/2022 CLINICAL DATA:  Questionable sepsis EXAM: PORTABLE CHEST 1 VIEW COMPARISON:  Chest x-ray 02/15/2021 FINDINGS: There are patchy airspace opacities in both lung bases. There is a small left pleural effusion. Cardiomediastinal silhouette is within normal limits. No acute fractures are seen. IMPRESSION: 1. Patchy airspace opacities in both lung bases, concerning for infection. 2. Small left pleural effusion. Electronically Signed   By: Ronney Asters M.D.   On: 01/02/2022 19:51    Procedures .Critical Care  Performed by: Jeanell Sparrow, DO Authorized by: Jeanell Sparrow, DO   Critical care provider statement:    Critical care time (minutes):  49   Critical care time was exclusive of:  Separately billable procedures and treating other patients   Critical care was necessary to treat or prevent imminent or life-threatening deterioration of the following conditions:  Sepsis   Critical care was time spent personally by me on the following activities:  Development of treatment plan with patient or surrogate, discussions with consultants, evaluation of patient's response to treatment, examination of patient, ordering and review of laboratory studies, ordering and review of radiographic studies, ordering and performing treatments and interventions, pulse oximetry, re-evaluation of patient's condition, review of old charts and obtaining history from patient or surrogate   Care discussed with: admitting provider  Medications Ordered in ED Medications  lactated ringers infusion ( Intravenous New Bag/Given 01/02/22 1920)  cefTRIAXone (ROCEPHIN) 2 g in sodium chloride 0.9 % 100 mL IVPB (0 g  Intravenous Stopped 01/02/22 2049)  azithromycin (ZITHROMAX) 500 mg in sodium chloride 0.9 % 250 mL IVPB (has no administration in time range)  lactated ringers bolus 1,000 mL (0 mLs Intravenous Stopped 01/02/22 2049)  acetaminophen (TYLENOL) tablet 650 mg (650 mg Oral Given 01/02/22 2008)    ED Course/ Medical Decision Making/ A&P                           Medical Decision Making Amount and/or Complexity of Data Reviewed Labs: ordered. Radiology: ordered. ECG/medicine tests: ordered.  Risk OTC drugs. Prescription drug management. Decision regarding hospitalization.   This patient presents to the ED with chief complaint(s) of cough, dib, fever with pertinent past medical history of copd, as above which further complicates the presenting complaint. The complaint involves an extensive differential diagnosis and also carries with it a high risk of complications and morbidity.     In my evaluation of this patient's dyspnea my DDx includes, but is not limited to, pneumonia, pulmonary embolism, pneumothorax, pulmonary edema, metabolic acidosis, asthma, COPD, cardiac cause, anemia, anxiety, etc.   . Serious etiologies were considered.   The initial plan is to screening labs/imaging/apap/ivf/code sepsis   Additional history obtained: Additional history obtained from family Records reviewed  prior ED visits, prior labs/imaging, primary care notes   Independent labs interpretation:  The following labs were independently interpreted:  WBC w/ mild elev 10.6, LA elev 2.1, febrile/tachy/tachypneic; likely pna; code sepsis Cr similar to baseline Trop is elev, favor demand ischemia in setting of PNA/sepsis, EKG stable, no ongoing CP  Independent visualization of imaging: - I independently visualized the following imaging with scope of interpretation limited to determining acute life threatening conditions related to emergency care: CXR, which revealed b/l pna, pleural eff  Cardiac  monitoring was reviewed and interpreted by myself which shows sinus tachy  Treatment and Reassessment: LR  APAP Rocephin/azithro Code sepsis 3.5L [place] (baseline is 3L) >> improved  Consultation: - Consulted or discussed management/test interpretation w/ external professional: na  Consideration for admission or further workup: Admission was considered   Pt here today with history c/w PNA, found to have pna, meets for sepsis. Started rocephin/azithro. Respiratory status improved after nebs via EMS, she is feeling better overall. Meets for sepsis, not in septic shock. Recommend admission in light of PSI/PORT score of 126. Pt/family agreeable. D/w hospitalist who accepts pt for admission.   Social Determinants of health: Social History   Tobacco Use   Smoking status: Former    Packs/day: 1.00    Years: 40.00    Total pack years: 40.00    Types: Cigarettes    Quit date: 02/14/1994    Years since quitting: 27.9   Smokeless tobacco: Never  Substance Use Topics   Alcohol use: No   Drug use: No            Final Clinical Impression(s) / ED Diagnoses Final diagnoses:  Sepsis, due to unspecified organism, unspecified whether acute organ dysfunction present [facility])  Community acquired pneumonia, unspecified laterality    Rx / DC Orders ED Discharge Orders     None         Jeanell Sparrow, DO 01/02/22 2108

## 2022-01-02 NOTE — Assessment & Plan Note (Addendum)
-   Patient with chronic hypoxemia on 3 L secondary to COPD.  Requiring up to 3.5 L in the ED. -Secondary to community-acquired pneumonia seen with bibasilar opacity on chest x-ray with fever and leukocytosis. -Continue treatment with IV Rocephin and azithromycin -Wean down to home 3 L as tolerated with goal O2 greater than 88%

## 2022-01-02 NOTE — Assessment & Plan Note (Signed)
-   Creatinine of 1.69 up from 1.57 - Likely prerenal.  Keep on continuous IV fluids and follow creatinine in the morning.

## 2022-01-02 NOTE — Sepsis Progress Note (Signed)
[facility]link following for Sepsis Protocol 

## 2022-01-02 NOTE — H&P (Addendum)
History and Physical    Patient: Courtney Cline KGY:[id number] DOB: [birth year] DOA: 01/02/2022 DOS: the patient was seen and examined on 01/02/2022 PCP: Leeroy Cha, MD  Patient coming from: Home  Chief Complaint:  Chief Complaint  Patient presents with   Shortness of Breath   HPI: Courtney Cline is a 88 y.o. female with medical history significant of history of right breast cancer s/p right lumpectomy completed anastrozole in remission, iron deficiency anemia s/p Retacrit and IV Venofer, atrial ablation on Eliquis, CKD stage IV, COPD with chronic hypoxemic respiratory failure on 3 L who presents with shortness of breath and cough.  Started to have increase shortness of breath with cough productive of grayish sputum about 2 days ago.  Had some lower midsternal mild chest pain.  Denies any nausea, vomiting or diarrhea.  No sick contact.  She has history of past tobacco use quit 20 years ago and is on 3 L chronically for COPD.  She had to increase her oxygen to 4 L last night although without relief.  No lower extremity edema.   In the ED, she was febrile up to 101.14F, heart rate of 112, RR of 30 and initially hypoxic to 85% on 3 L requiring 3.5 L.  She was then eventually able to wean back down to her home 3 L.  WBC of 10.6, hemoglobin 9.2, lactate of 2.1.  Sodium 135, K of 3.8, creatinine of 1.69 up from 1.57  Negative flu/COVID PCR  EKG my review shows sinus tachycardia.  Troponin mildly elevated at 54.  Chest x-ray shows patchy airspace opacity in bilateral lung bases concerning for pneumonia.  Small left pleural effusion. Review of Systems: As mentioned in the history of present illness. All other systems reviewed and are negative. Past Medical History:  Diagnosis Date   Allergic rhinitis    Arthritis    Breast calcification seen on mammogram    Right breast   Breast cancer (HCC)    COPD (chronic obstructive pulmonary disease) (HCC)    GERD  (gastroesophageal reflux disease)    Hyperglycemia    Hyperlipidemia    Hypertension    Low back pain    On home oxygen therapy    all the time   Osteopenia    Peripheral edema    Shortness of breath    Subclavian artery stenosis, left ([place])    Vitamin D deficiency    Wears dentures    top   Wears glasses    reading   Past Surgical History:  Procedure Laterality Date   BREAST BIOPSY     BREAST LUMPECTOMY     right 2015   BREAST LUMPECTOMY WITH RADIOACTIVE SEED LOCALIZATION Right 12/20/2013   Procedure: RIGHT BREAST LUMPECTOMY WITH RADIOACTIVE SEED LOCALIZATION;  Surgeon: Erroll Luna, MD;  Location: [facility];  Service: General;  Laterality: Right;   CAROTID DUPLEX  2014   CATARACT EXTRACTION     both eyes   Social History:  reports that she quit smoking about 27 years ago. Her smoking use included cigarettes. She has a 40.00 pack-year smoking history. She has never used smokeless tobacco. She reports that she does not drink alcohol and does not use drugs. Patient lives at home with her husband No Known Allergies  Family History  Problem Relation Age of Onset   Emphysema Mother    Emphysema Maternal Grandfather     Prior to Admission medications   Medication Sig Start Date End Date Taking? Authorizing Provider  acetaminophen (TYLENOL) 325 MG tablet Take 650 mg by mouth every 6 (six) hours as needed for mild pain or headache.     [provider]  albuterol (PROVENTIL) (2.5 MG/3ML) 0.083% nebulizer solution Take 2.5 mg by nebulization every 6 (six) hours as needed for wheezing or shortness of breath. 01/02/22   [provider]  apixaban (ELIQUIS) 2.5 MG TABS tablet Take 1 tablet (2.5 mg total) by mouth 2 (two) times daily. 02/19/21   Darliss Cheney, MD  Cholecalciferol (VITAMIN D) 2000 UNITS CAPS Take 2,000 Units by mouth daily.     [provider]  Cyanocobalamin (VITAMIN B 12 PO) Take 1 tablet by mouth daily.    [provider]  Fluticasone-Umeclidin-Vilant (TRELEGY ELLIPTA) 200-62.5-25 MCG/ACT AEPB Inhale 1 puff into the lungs daily. 09/30/21   Margaretha Seeds, MD  furosemide (LASIX) 40 MG tablet Take 0.5 tablets (20 mg total) by mouth 2 (two) times daily. <PLEASE MAKE APPOINTMENT FOR REFILLS> 02/19/21 05/06/21  Darliss Cheney, MD  metoprolol tartrate (LOPRESSOR) 25 MG tablet Take 1 tablet (25 mg total) by mouth 2 (two) times daily. 07/17/19   Noemi Chapel P, DO  PROAIR HFA 108 (90 Base) MCG/ACT inhaler TAKE 2 PUFFS BY MOUTH EVERY 6 HOURS AS NEEDED FOR WHEEZE OR SHORTNESS OF BREATH Patient taking differently: Inhale 2 puffs into the lungs every 6 (six) hours as needed for wheezing or shortness of breath. 08/16/17   Juanito Doom, MD  rosuvastatin (CRESTOR) 40 MG tablet Take 40 mg by mouth daily. 02/02/21   [provider]    Physical Exam: Vitals:   01/02/22 2015 01/02/22 2100 01/02/22 2123 01/02/22 2124  BP: (!) 136/54 (!) 114/49 (!) 135/49   Pulse: (!) 108 (!) 103 (!) 107   Resp: (!) 24 (!) 25 (!) 23   Temp:    98.9 F (37.2 C)  TempSrc:    Oral  SpO2: 96% 96% 97%   Weight:      Height:       Constitutional: NAD, calm, comfortable, nontoxic appearing elderly female sitting upright in bed Eyes: lids and conjunctivae normal ENMT: Mucous membranes are moist.  Neck: normal, supple Respiratory: clear to auscultation bilaterally, no wheezing, no crackles. Normal respiratory effort. No accessory muscle use.  On 3.5 L of oxygen via nasal cannula. Cardiovascular: Regular rate and rhythm, no murmurs / rubs / gallops. No extremity edema.  Abdomen: Soft, nontender.  Bowel sounds positive.  Musculoskeletal: no clubbing / cyanosis. No joint deformity upper and lower extremities. Good ROM, no contractures. Normal muscle tone.  Skin: no rashes, lesions, ulcers. No induration Neurologic: CN 2-12 grossly intact.  Strength 5/5 in all 4.  Psychiatric: Normal judgment and insight. Alert and oriented x 3.  Normal mood. Data Reviewed:  See HPI  Assessment and Plan: * Acute on chronic respiratory failure with hypoxia ([place]) - Patient with chronic hypoxemia on 3 L secondary to COPD.  Requiring up to 3.5 L in the ED. -Secondary to community-acquired pneumonia seen with bibasilar opacity on chest x-ray with fever and leukocytosis. -Continue treatment with IV Rocephin and azithromycin -Wean down to home 3 L as tolerated with goal O2 greater than 88%  Sepsis ([place]) - Secondary to community-acquired pneumonia - Presented with fever, tachycardia and leukocytosis with findings of bilateral opacities seen on chest x-ray - Continue treatment with IV Rocephin and azithromycin  Elevated troponin Troponin elevated to 54.  Suspect likely demand ischemia from sepsis.  AKI (acute kidney injury) ([place]) -  Creatinine of 1.69 up from 1.57 - Likely prerenal.  Keep on continuous IV fluids and follow creatinine in the morning.  Persistent atrial fibrillation (HCC) Continue home Eliquis, beta-blocker  Hyperlipidemia Continue statin  Essential hypertension Normotensive - Continue home metoprolol      Advance Care Planning:   Code Status: DNR DNR  Consults: None  Family Communication: None at bedside  Severity of Illness: The appropriate patient status for this patient is INPATIENT. Inpatient status is judged to be reasonable and necessary in order to provide the required intensity of service to ensure the patient's safety. The patient's presenting symptoms, physical exam findings, and initial radiographic and laboratory data in the context of their chronic comorbidities is felt to place them at high risk for further clinical deterioration. Furthermore, it is not anticipated that the patient will be medically stable for discharge from the hospital within 2 midnights of admission.   * I certify that at the point of admission it is my clinical judgment that the patient will require inpatient hospital care  spanning beyond 2 midnights from the point of admission due to high intensity of service, high risk for further deterioration and high frequency of surveillance required.*  Author: Orene Desanctis, DO 01/02/2022 10:42 PM  For on call review [email or web address].

## 2022-01-02 NOTE — Assessment & Plan Note (Signed)
Continue statin. 

## 2022-01-02 NOTE — Assessment & Plan Note (Signed)
Normotensive - Continue home metoprolol

## 2022-01-03 DIAGNOSIS — R7989 Other specified abnormal findings of blood chemistry: Secondary | ICD-10-CM | POA: Diagnosis not present

## 2022-01-03 DIAGNOSIS — J9621 Acute and chronic respiratory failure with hypoxia: Secondary | ICD-10-CM | POA: Diagnosis not present

## 2022-01-03 DIAGNOSIS — N179 Acute kidney failure, unspecified: Secondary | ICD-10-CM | POA: Diagnosis not present

## 2022-01-03 DIAGNOSIS — J189 Pneumonia, unspecified organism: Secondary | ICD-10-CM | POA: Diagnosis not present

## 2022-01-03 LAB — BASIC METABOLIC PANEL
Anion gap: 10 (ref 5–15)
BUN: 25 mg/dL — ABNORMAL HIGH (ref 8–23)
CO2: 25 mmol/L (ref 22–32)
Calcium: 8.9 mg/dL (ref 8.9–10.3)
Chloride: 103 mmol/L (ref 98–111)
Creatinine, Ser: 1.52 mg/dL — ABNORMAL HIGH (ref 0.44–1.00)
GFR, Estimated: 33 mL/min — ABNORMAL LOW (ref >60)
Glucose, Bld: 161 mg/dL — ABNORMAL HIGH (ref 70–99)
Potassium: 4.2 mmol/L (ref 3.5–5.1)
Sodium: 138 mmol/L (ref 135–145)

## 2022-01-03 LAB — URINALYSIS, ROUTINE W REFLEX MICROSCOPIC
Bilirubin Urine: NEGATIVE
Glucose, UA: NEGATIVE mg/dL
Ketones, ur: NEGATIVE mg/dL
Nitrite: NEGATIVE
Protein, ur: 30 mg/dL — AB
Specific Gravity, Urine: 1.018 (ref 1.005–1.030)
pH: 6 (ref 5.0–8.0)

## 2022-01-03 LAB — CBC
HCT: 26.6 % — ABNORMAL LOW (ref 36.0–46.0)
Hemoglobin: 8.1 g/dL — ABNORMAL LOW (ref 12.0–15.0)
MCH: 30.3 pg (ref 26.0–34.0)
MCHC: 30.5 g/dL (ref 30.0–36.0)
MCV: 99.6 fL (ref 80.0–100.0)
Platelets: 196 10*3/uL (ref 150–400)
RBC: 2.67 MIL/uL — ABNORMAL LOW (ref 3.87–5.11)
RDW: 14.4 % (ref 11.5–15.5)
WBC: 7.8 10*3/uL (ref 4.0–10.5)
nRBC: 0 % (ref 0.0–0.2)

## 2022-01-03 MED ORDER — GUAIFENESIN ER 600 MG PO TB12
1200.0000 mg | ORAL_TABLET | Freq: Two times a day (BID) | ORAL | Status: DC
  Administered 2022-01-03 – 2022-01-04 (×4): 1200 mg via ORAL
  Filled 2022-01-03 (×5): qty 2

## 2022-01-03 NOTE — Progress Notes (Signed)
PROGRESS NOTE    Courtney Cline  JKK:[id number] DOB: February 18, 1933 DOA: 01/02/2022 PCP: Leeroy Cha, MD   Brief Narrative:  The patient is an 88 year old obese Caucasian female with a past medical history significant for but not limited to history of right breast cancer status postlumpectomy, as well as completed anastrozole in remission, history of iron deficiency anemia status post Retacrit and IV Venofer, history of atrial fibrillation on Eliquis, CKD stage IV, COPD with chronic hypoxic respiratory failure with 3 L as well as other comorbidities who presents with worsening shortness of breath and cough.  She stated that she had increased shortness of breath with productive cough of grayish sputum about 2 days ago which then changed to yellowish.  She states that she had some midsternal mild chest pain and denies any nausea or vomiting or diarrhea.  She has had a history of tobacco abuse in the past but quit 20 years ago and is chronically on 3 L of supplemental oxygen for her COPD.  She had to increase her oxygen requirement to 4 L without an relief.  She had no evidence of lower extremity edema.  While she was in the ED she was found to be febrile up to 101.5 had a heart rate of 112 and respiratory rate of 30 and initially was hypoxic at 85% on 3 L.  WBC was slightly elevated at 10.6.  She was negative for flu and COVID via PCR but chest x-ray did show patchy airspace opacities at bilateral lung bases concerning for pneumonia.  She also had a small pleural effusion.  She is improving and will need a repeat chest x-ray and ambulatory home O2 screen prior to discharge,   Assessment and Plan: * Acute on chronic respiratory failure with hypoxia ([place]) 2/2 to Community Acquired Pneumonia superimposed on COPD -Patient with chronic hypoxemia on 3 L secondary to COPD.  Requiring up to 3.5 L in the ED. -Secondary to community-acquired pneumonia seen with bibasilar opacity on chest x-ray with  fever and leukocytosis. -SpO2: 100 % O2 Flow Rate (L/min): 3 L/min -Continuous Pulse Oximetry and Maintain O2 Sats >90% -Continue treatment with IV Rocephin and azithromycin -Continue with Breo Ellipta as well as Incruse Ellipta -Patient has albuterol neb 3 mL IH every 6 as needed for wheezing or shortness of breath -WBC went from 10.6 and is now 7.8 -Lactic acid level went from 2.1 is now 1.7 -We will add guaifenesin 1200 mg p.o. twice daily,, flutter valve and incentive spirometry -Obtain SLP given that patient has had previous aspirations -Wean down to home 3 L as tolerated with goal O2 greater than 88% -She will need an ambulatory home O2 screen prior to discharge and repeat chest x-ray in the a.m.  Sepsis ([place]) 2/2 to PNA -Secondary to community-acquired pneumonia -Presented with fever, tachycardia and leukocytosis with findings of bilateral opacities seen on chest x-ray -Continue treatment with IV Rocephin and Azithromycin -Repeat CXR in the AM   Elevated Troponin -Mild Troponin elevation at 54 and trending down to 49.   -Suspect likely demand ischemia from sepsis. -No CP but if develops will consider Cardiology consultation   CKD Stage IV -Creatinine of 1.69  on admission  -Likely prerenal; patient's BUNs/creatinine is now 25/1.52 -She received a lactated Ringer's bolus of 1 L and placed on maintenance IV fluid hydration at 75 MLS per hour for 20 hours which we will continue Can avoid further nephrotoxic medications, contrast dyes, hypotension and dehydration to ensure adequate renal perfusion -We  will continue monitor renal function carefully and repeat CMP in a.m.  Persistent Atrial Fibrillation ([place]) -Placed on the medical telemetry floor -C/w Metoprolol Tartrate 25 mg po BID  -C/w Apixaban 2.5 mg po BID   Hyperlipidemia -Continue Rosuvastatin 40 mg p.o. daily  Essential Hypertension -Currently Normotensive -Continue Home Metoprolol Tartrate 25 mg po BID -Continue  to Monitor BP per protocol -Last BP reading was 139/56  Normocytic Anemia -Patient's hemoglobin/hematocrit has gone from 9.1/29.6 -> 9.2/27.0 -> 8.1/26.6 -Check Anemia Panel in the AM -Continue to Monitor for S/Sx of Bleeding; No overt bleeding noted -Repeat CBC in the AM   Obesity -Complicates overall prognosis and care -Estimated body mass index is 30.27 kg/m as calculated from the following:   Height as of this encounter: 5' (1.524 m).   Weight as of this encounter: 70.3 kg.  -Weight Loss and Dietary Counseling given   DVT prophylaxis: apixaban (ELIQUIS) tablet 2.5 mg Start: 01/02/22 2245 apixaban (ELIQUIS) tablet 2.5 mg    Code Status: DNR Family Communication: Spoke with son at bedside  Disposition Plan:  Level of care: Telemetry Medical Status is: Inpatient Remains inpatient appropriate because she needs further clinical improvement and weaning of her oxygen.  Consultants:  None  Procedures:  As delineated above  Antimicrobials:  Anti-infectives (From admission, onward)    Start     Dose/Rate Route Frequency Ordered Stop   01/02/22 1900  cefTRIAXone (ROCEPHIN) 2 g in sodium chloride 0.9 % 100 mL IVPB        2 g 200 mL/hr over 30 Minutes Intravenous Every 24 hours 01/02/22 1857 01/07/22 1859   01/02/22 1900  azithromycin (ZITHROMAX) 500 mg in sodium chloride 0.9 % 250 mL IVPB        500 mg 250 mL/hr over 60 Minutes Intravenous Every 24 hours 01/02/22 1857 01/07/22 1859       Subjective: Seen and examined at bedside and thinks that she is doing about 75% better with her breathing.  Continues to cough and has yellowish sputum.  No nausea or vomiting.  Denies any chest pain.  No other concerns or complaints at this time  Objective: Vitals:   01/03/22 0900 01/03/22 1000 01/03/22 1100 01/03/22 1103  BP: 122/64 (!) 134/52 130/83   Pulse: 72 (!) 52 73   Resp: 19 (!) 28 20   Temp:    97.8 F (36.6 C)  TempSrc:    Oral  SpO2: 99% 99% 100%   Weight:       Height:        Intake/Output Summary (Last 24 hours) at 01/03/2022 1229 Last data filed at 01/03/2022 6734 Gross per 24 hour  Intake 791.18 ml  Output --  Net 791.18 ml   Filed Weights   01/02/22 1900  Weight: 70.3 kg   Examination: Physical Exam:  Constitutional: WN/WD obese Caucasian elderly female in no acute distress appears calm Respiratory: Diminished to auscultation bilaterally with coarse breath sounds, no wheezing, rales, rhonchi or crackles. Normal respiratory effort and patient is not tachypenic. No accessory muscle use.  Unlabored breathing Cardiovascular: RRR, no murmurs / rubs / gallops. S1 and S2 auscultated.  Minimal extremity edema Abdomen: Soft, slightly-tender, distended secondary body habitus bowel sounds positive.  GU: Deferred. Musculoskeletal: No clubbing / cyanosis of digits/nails. No joint deformity upper and lower extremities. Skin: No rashes, lesions, ulcers on limited skin evaluation. No induration; Warm and dry.  Neurologic: CN 2-12 grossly intact with no focal deficits.  Romberg sign and cerebellar reflexes not  assessed.  Psychiatric: Normal judgment and insight. Alert and oriented x 3. Normal mood and appropriate affect.   Data Reviewed: I have personally reviewed following labs and imaging studies  CBC: Recent Labs  Lab 01/02/22 1917 01/02/22 1939 01/03/22 0421  WBC 10.6*  --  7.8  NEUTROABS 9.0*  --   --   HGB 9.1* 9.2* 8.1*  HCT 29.6* 27.0* 26.6*  MCV 98.7  --  99.6  PLT 215  --  027   Basic Metabolic Panel: Recent Labs  Lab 01/02/22 1917 01/02/22 1939 01/03/22 0421  NA 136 135 138  K 3.8 3.8 4.2  CL 100  --  103  CO2 24  --  25  GLUCOSE 167*  --  161*  BUN 23  --  25*  CREATININE 1.69*  --  1.52*  CALCIUM 9.0  --  8.9   GFR: Estimated Creatinine Clearance: 23.2 mL/min (A) (by C-G formula based on SCr of 1.52 mg/dL (H)). Liver Function Tests: Recent Labs  Lab 01/02/22 1917  AST 17  ALT 9  ALKPHOS 57  BILITOT 0.8   PROT 6.5  ALBUMIN 2.9*   No results for input(s): "LIPASE", "AMYLASE" in the last 168 hours. No results for input(s): "AMMONIA" in the last 168 hours. Coagulation Profile: Recent Labs  Lab 01/02/22 1917  INR 1.3*   Cardiac Enzymes: No results for input(s): "CKTOTAL", "CKMB", "CKMBINDEX", "TROPONINI" in the last 168 hours. BNP (last 3 results) No results for input(s): "PROBNP" in the last 8760 hours. HbA1C: No results for input(s): "HGBA1C" in the last 72 hours. CBG: No results for input(s): "GLUCAP" in the last 168 hours. Lipid Profile: No results for input(s): "CHOL", "HDL", "LDLCALC", "TRIG", "CHOLHDL", "LDLDIRECT" in the last 72 hours. Thyroid Function Tests: No results for input(s): "TSH", "T4TOTAL", "FREET4", "T3FREE", "THYROIDAB" in the last 72 hours. Anemia Panel: No results for input(s): "VITAMINB12", "FOLATE", "FERRITIN", "TIBC", "IRON", "RETICCTPCT" in the last 72 hours. Sepsis Labs: Recent Labs  Lab 01/02/22 1911 01/02/22 2140  LATICACIDVEN 2.1* 1.7    Recent Results (from the past 240 hour(s))  Resp Panel by RT-PCR (Flu A&B, Covid) Anterior Nasal Swab     Status: None   Collection Time: 01/02/22  6:57 PM   Specimen: Anterior Nasal Swab  Result Value Ref Range Status   SARS Coronavirus 2 by RT PCR NEGATIVE NEGATIVE Final    Comment: (NOTE) SARS-CoV-2 target nucleic acids are NOT DETECTED.  The SARS-CoV-2 RNA is generally detectable in upper respiratory specimens during the acute phase of infection. The lowest concentration of SARS-CoV-2 viral copies this assay can detect is 138 copies/mL. A negative result does not preclude SARS-Cov-2 infection and should not be used as the sole basis for treatment or other patient management decisions. A negative result may occur with  improper specimen collection/handling, submission of specimen other than nasopharyngeal swab, presence of viral mutation(s) within the areas targeted by this assay, and inadequate number  of viral copies(<138 copies/mL). A negative result must be combined with clinical observations, patient history, and epidemiological information. The expected result is Negative.  Fact Sheet for Patients:  EntrepreneurPulse.com.au  Fact Sheet for Healthcare Providers:  IncredibleEmployment.be  This test is no t yet approved or cleared by the [place] FDA and  has been authorized for detection and/or diagnosis of SARS-CoV-2 by FDA under an Emergency Use Authorization (EUA). This EUA will remain  in effect (meaning this test can be used) for the duration of the COVID-19 declaration under Section 564(b)(1)  of the Act, 21 U.S.C.section 360bbb-3(b)(1), unless the authorization is terminated  or revoked sooner.       Influenza A by PCR NEGATIVE NEGATIVE Final   Influenza B by PCR NEGATIVE NEGATIVE Final    Comment: (NOTE) The Xpert Xpress SARS-CoV-2/FLU/RSV plus assay is intended as an aid in the diagnosis of influenza from Nasopharyngeal swab specimens and should not be used as a sole basis for treatment. Nasal washings and aspirates are unacceptable for Xpert Xpress SARS-CoV-2/FLU/RSV testing.  Fact Sheet for Patients: EntrepreneurPulse.com.au  Fact Sheet for Healthcare Providers: IncredibleEmployment.be  This test is not yet approved or cleared by the [place] FDA and has been authorized for detection and/or diagnosis of SARS-CoV-2 by FDA under an Emergency Use Authorization (EUA). This EUA will remain in effect (meaning this test can be used) for the duration of the COVID-19 declaration under Section 564(b)(1) of the Act, 21 U.S.C. section 360bbb-3(b)(1), unless the authorization is terminated or revoked.  Performed at [facility], [place] [address], [place], [place] [address]   Blood Culture (routine x 2)     Status: None (Preliminary result)   Collection Time: 01/02/22  7:07 PM    Specimen: BLOOD  Result Value Ref Range Status   Specimen Description BLOOD RIGHT ANTECUBITAL  Final   Special Requests   Final    BOTTLES DRAWN AEROBIC AND ANAEROBIC Blood Culture results may not be optimal due to an inadequate volume of blood received in culture bottles   Culture   Final    NO GROWTH < 12 HOURS Performed at [facility], [place] [address] [place], [place] [address]    Report Status PENDING  Incomplete  Blood Culture (routine x 2)     Status: None (Preliminary result)   Collection Time: 01/02/22  7:12 PM   Specimen: BLOOD LEFT HAND  Result Value Ref Range Status   Specimen Description BLOOD LEFT HAND  Final   Special Requests   Final    BOTTLES DRAWN AEROBIC AND ANAEROBIC Blood Culture results may not be optimal due to an excessive volume of blood received in culture bottles   Culture   Final    NO GROWTH < 12 HOURS Performed at [facility], [place] [address], [place], [place] [address]    Report Status PENDING  Incomplete    Radiology Studies: DG Chest Port 1 View  Result Date: 01/02/2022 CLINICAL DATA:  Questionable sepsis EXAM: PORTABLE CHEST 1 VIEW COMPARISON:  Chest x-ray 02/15/2021 FINDINGS: There are patchy airspace opacities in both lung bases. There is a small left pleural effusion. Cardiomediastinal silhouette is within normal limits. No acute fractures are seen. IMPRESSION: 1. Patchy airspace opacities in both lung bases, concerning for infection. 2. Small left pleural effusion. Electronically Signed   By: Ronney Asters M.D.   On: 01/02/2022 19:51    Scheduled Meds:  apixaban  2.5 mg Oral BID   cholecalciferol  2,000 Units Oral Daily   fluticasone furoate-vilanterol  1 puff Inhalation Daily   And   umeclidinium bromide  1 puff Inhalation Daily   metoprolol tartrate  25 mg Oral BID   rosuvastatin  40 mg Oral Daily   Continuous Infusions:  azithromycin Stopped (01/02/22 2243)   cefTRIAXone (ROCEPHIN)  IV Stopped (01/02/22 2049)   lactated  ringers 75 mL/hr at 01/03/22 0945    LOS: 1 day   Raiford Noble, DO [facility] Available via Epic secure chat 7am-7pm After these hours, please refer to  coverage provider listed on [email or web address] 01/03/2022, 12:29 PM

## 2022-01-04 ENCOUNTER — Inpatient Hospital Stay (HOSPITAL_COMMUNITY): Payer: Medicare HMO

## 2022-01-04 DIAGNOSIS — N179 Acute kidney failure, unspecified: Secondary | ICD-10-CM | POA: Diagnosis not present

## 2022-01-04 DIAGNOSIS — R7989 Other specified abnormal findings of blood chemistry: Secondary | ICD-10-CM | POA: Diagnosis not present

## 2022-01-04 DIAGNOSIS — J9621 Acute and chronic respiratory failure with hypoxia: Secondary | ICD-10-CM | POA: Diagnosis not present

## 2022-01-04 DIAGNOSIS — J189 Pneumonia, unspecified organism: Secondary | ICD-10-CM | POA: Diagnosis not present

## 2022-01-04 LAB — COMPREHENSIVE METABOLIC PANEL
ALT: 13 U/L (ref 0–44)
AST: 19 U/L (ref 15–41)
Albumin: 2.4 g/dL — ABNORMAL LOW (ref 3.5–5.0)
Alkaline Phosphatase: 51 U/L (ref 38–126)
Anion gap: 9 (ref 5–15)
BUN: 34 mg/dL — ABNORMAL HIGH (ref 8–23)
CO2: 27 mmol/L (ref 22–32)
Calcium: 8.8 mg/dL — ABNORMAL LOW (ref 8.9–10.3)
Chloride: 105 mmol/L (ref 98–111)
Creatinine, Ser: 1.36 mg/dL — ABNORMAL HIGH (ref 0.44–1.00)
GFR, Estimated: 38 mL/min — ABNORMAL LOW (ref >60)
Glucose, Bld: 103 mg/dL — ABNORMAL HIGH (ref 70–99)
Potassium: 4.6 mmol/L (ref 3.5–5.1)
Sodium: 141 mmol/L (ref 135–145)
Total Bilirubin: 0.3 mg/dL (ref 0.3–1.2)
Total Protein: 5.7 g/dL — ABNORMAL LOW (ref 6.5–8.1)

## 2022-01-04 LAB — IRON AND TIBC
Iron: 48 ug/dL (ref 28–170)
Saturation Ratios: 23 % (ref 10.4–31.8)
TIBC: 209 ug/dL — ABNORMAL LOW (ref 250–450)
UIBC: 161 ug/dL

## 2022-01-04 LAB — CBC WITH DIFFERENTIAL/PLATELET
Abs Immature Granulocytes: 0.06 10*3/uL (ref 0.00–0.07)
Basophils Absolute: 0 10*3/uL (ref 0.0–0.1)
Basophils Relative: 0 %
Eosinophils Absolute: 0 10*3/uL (ref 0.0–0.5)
Eosinophils Relative: 0 %
HCT: 26.4 % — ABNORMAL LOW (ref 36.0–46.0)
Hemoglobin: 8 g/dL — ABNORMAL LOW (ref 12.0–15.0)
Immature Granulocytes: 1 %
Lymphocytes Relative: 7 %
Lymphs Abs: 0.8 10*3/uL (ref 0.7–4.0)
MCH: 30.2 pg (ref 26.0–34.0)
MCHC: 30.3 g/dL (ref 30.0–36.0)
MCV: 99.6 fL (ref 80.0–100.0)
Monocytes Absolute: 0.6 10*3/uL (ref 0.1–1.0)
Monocytes Relative: 6 %
Neutro Abs: 9.5 10*3/uL — ABNORMAL HIGH (ref 1.7–7.7)
Neutrophils Relative %: 86 %
Platelets: 208 10*3/uL (ref 150–400)
RBC: 2.65 MIL/uL — ABNORMAL LOW (ref 3.87–5.11)
RDW: 14.1 % (ref 11.5–15.5)
WBC: 11 10*3/uL — ABNORMAL HIGH (ref 4.0–10.5)
nRBC: 0 % (ref 0.0–0.2)

## 2022-01-04 LAB — FOLATE: Folate: 6.1 ng/mL (ref >5.9)

## 2022-01-04 LAB — RETICULOCYTES
Immature Retic Fract: 24.9 % — ABNORMAL HIGH (ref 2.3–15.9)
RBC.: 2.83 MIL/uL — ABNORMAL LOW (ref 3.87–5.11)
Retic Count, Absolute: 60.8 10*3/uL (ref 19.0–186.0)
Retic Ct Pct: 2.2 % (ref 0.4–3.1)

## 2022-01-04 LAB — VITAMIN B12: Vitamin B-12: 411 pg/mL (ref 180–914)

## 2022-01-04 LAB — MAGNESIUM: Magnesium: 2.6 mg/dL — ABNORMAL HIGH (ref 1.7–2.4)

## 2022-01-04 LAB — FERRITIN: Ferritin: 287 ng/mL (ref 11–307)

## 2022-01-04 LAB — PHOSPHORUS: Phosphorus: 3.7 mg/dL (ref 2.5–4.6)

## 2022-01-04 MED ORDER — METOPROLOL TARTRATE 5 MG/5ML IV SOLN
5.0000 mg | Freq: Once | INTRAVENOUS | Status: AC
  Administered 2022-01-04: 5 mg via INTRAVENOUS
  Filled 2022-01-04: qty 5

## 2022-01-04 NOTE — Progress Notes (Signed)
   01/04/22 0300  Assess: MEWS Score  Temp (!) 97.5 F (36.4 C)  BP 122/74  MAP (mmHg) 75  Pulse Rate (!) 51  ECG Heart Rate (!) 50  Resp (!) 22  SpO2 98 %  O2 Device Nasal Cannula  O2 Flow Rate (L/min) 3 L/min  Assess: MEWS Score  MEWS Temp 0  MEWS Systolic 0  MEWS Pulse 1  MEWS RR 1  MEWS LOC 0  MEWS Score 2  MEWS Score Color Yellow  Assess: if the MEWS score is Yellow or Red  Were vital signs taken at a resting state? Yes  Focused Assessment No change from prior assessment  Does the patient meet 2 or more of the SIRS criteria? Yes  Does the patient have a confirmed or suspected source of infection? Yes  Provider and Rapid Response Notified? No  MEWS guidelines implemented *See Row Information* Yes  Treat  Pain Scale 0-10  Pain Score 0  Take Vital Signs  Increase Vital Sign Frequency  Yellow: Q 2hr X 2 then Q 4hr X 2, if remains yellow, continue Q 4hrs  Escalate  MEWS: Escalate Yellow: discuss with charge nurse/RN and consider discussing with provider and RRT  Notify: Charge Nurse/RN  Name of Charge Nurse/RN Notified Janie, RN  Date Charge Nurse/RN Notified 01/04/22  Time Charge Nurse/RN Notified 0500  Provider Notification  Provider Name/Title Mitzi Hansen, MD  Date Provider Notified 01/04/22  Time Provider Notified [phone]  Document  Patient Outcome Stabilized after interventions  Progress note created (see row info) Yes  Assess: SIRS CRITERIA  SIRS Temperature  0  SIRS Pulse 0  SIRS Respirations  1  SIRS WBC 1  SIRS Score Sum  2

## 2022-01-04 NOTE — Evaluation (Signed)
Physical Therapy Evaluation Patient Details Name: Courtney Cline MRN: [id number] DOB: January 10, 1934 Today's Date: 01/04/2022  History of Present Illness  Pt is 88 year old presented to [facility] on  01/02/22 with acute on chronic respiratory failure with hypoxia and sepsis due to PNA superimposed on COPD. PMH - copd, breast CA, ckd, obesity, afib.  Clinical Impression  Pt admitted with above diagnosis and presents to PT with functional limitations due to deficits listed below (See PT problem list). Pt needs skilled PT to maximize independence and safety to allow discharge to home with family. Pt was too fatigued to amb this PM. Expect she will make steady progress and will work on ambulation next visit.          Recommendations for follow up therapy are one component of a multi-disciplinary discharge planning process, led by the attending physician.  Recommendations may be updated based on patient status, additional functional criteria and insurance authorization.  Follow Up Recommendations Home health PT      Assistance Recommended at Discharge Intermittent Supervision/Assistance  Patient can return home with the following  A little help with walking and/or transfers;A little help with bathing/dressing/bathroom;Assist for transportation;Help with stairs or ramp for entrance    Equipment Recommendations None recommended by PT  Recommendations for Other Services       Functional Status Assessment Patient has had a recent decline in their functional status and demonstrates the ability to make significant improvements in function in a reasonable and predictable amount of time.     Precautions / Restrictions Precautions Precautions: Fall      Mobility  Bed Mobility Overal bed mobility: Modified Independent             General bed mobility comments: Incr time    Transfers Overall transfer level: Needs assistance Equipment used: 1 person hand held assist, Rolling walker (2  wheels) Transfers: Sit to/from Stand, Bed to chair/wheelchair/BSC Sit to Stand: Min assist   Step pivot transfers: Min assist       General transfer comment: Assist for balance and support.    Ambulation/Gait               General Gait Details: Pt declined. Reported feeling too weak.  Stairs            Wheelchair Mobility    Modified Rankin (Stroke Patients Only)       Balance Overall balance assessment: Needs assistance Sitting-balance support: No upper extremity supported, Feet supported Sitting balance-Leahy Scale: Good     Standing balance support: Single extremity supported, Bilateral upper extremity supported, During functional activity Standing balance-Leahy Scale: Poor Standing balance comment: UE support for static standing                             Pertinent Vitals/Pain      Home Living Family/patient expects to be discharged to:: Private residence Living Arrangements: Spouse/significant other Available Help at Discharge: Family;Available 24 hours/day (husband present 24/7. Sons available intermittently) Type of Home: House Home Access: Stairs to enter Entrance Stairs-Rails: Right;Left;Can reach both Entrance Stairs-Number of Steps: 3   Home Layout: One level Home Equipment: Conservation officer, nature (2 wheels);Shower seat;Wheelchair - manual;BSC/3in1 Additional Comments: on 3L O2 at home    Prior Function Prior Level of Function : Independent/Modified Independent             Mobility Comments: does not use assistive device       Hand Dominance  Dominant Hand: Right    Extremity/Trunk Assessment   Upper Extremity Assessment Upper Extremity Assessment: Defer to OT evaluation    Lower Extremity Assessment Lower Extremity Assessment: Generalized weakness       Communication   Communication: No difficulties  Cognition Arousal/Alertness: Awake/alert Behavior During Therapy: WFL for tasks assessed/performed Overall  Cognitive Status: Within Functional Limits for tasks assessed                                          General Comments General comments (skin integrity, edema, etc.): HR 120-130 with transfer. Pt on 4L O2 with SpO2 >90%    Exercises     Assessment/Plan    PT Assessment Patient needs continued PT services  PT Problem List Decreased strength;Decreased activity tolerance;Decreased balance;Decreased mobility;Cardiopulmonary status limiting activity       PT Treatment Interventions DME instruction;Gait training;Stair training;Functional mobility training;Therapeutic activities;Therapeutic exercise;Balance training;Patient/family education    PT Goals (Current goals can be found in the Care Plan section)  Acute Rehab PT Goals Patient Stated Goal: return home PT Goal Formulation: With patient Time For Goal Achievement: 01/18/22 Potential to Achieve Goals: Good    Frequency Min 3X/week     Co-evaluation               AM-PAC PT "6 Clicks" Mobility  Outcome Measure Help needed turning from your back to your side while in a flat bed without using bedrails?: None Help needed moving from lying on your back to sitting on the side of a flat bed without using bedrails?: None Help needed moving to and from a bed to a chair (including a wheelchair)?: A Little Help needed standing up from a chair using your arms (e.g., wheelchair or bedside chair)?: A Little Help needed to walk in hospital room?: Total Help needed climbing 3-5 steps with a railing? : Total 6 Click Score: 16    End of Session Equipment Utilized During Treatment: Oxygen Activity Tolerance: Patient limited by fatigue Patient left: in bed;with call bell/phone within reach;with bed alarm set Nurse Communication: Mobility status PT Visit Diagnosis: Unsteadiness on feet (R26.81);Other abnormalities of gait and mobility (R26.89);Muscle weakness (generalized) (M62.81)    Time: 5093-2671 PT Time  Calculation (min) (ACUTE ONLY): 18 min   Charges:   PT Evaluation $PT Eval Moderate Complexity: 1 [place] [facility] [place] 01/04/2022, 4:53 PM

## 2022-01-04 NOTE — Evaluation (Signed)
Clinical/Bedside Swallow Evaluation Patient Details  Name: Adraine Biffle MRN: [id number] Date of Birth: 1933-12-26  Today's Date: 01/04/2022 Time: SLP Start Time (ACUTE ONLY): 8 SLP Stop Time (ACUTE ONLY): 1500 SLP Time Calculation (min) (ACUTE ONLY): 15 min  Past Medical History:  Past Medical History:  Diagnosis Date   Allergic rhinitis    Arthritis    Breast calcification seen on mammogram    Right breast   Breast cancer (HCC)    COPD (chronic obstructive pulmonary disease) (HCC)    GERD (gastroesophageal reflux disease)    Hyperglycemia    Hyperlipidemia    Hypertension    Low back pain    On home oxygen therapy    all the time   Osteopenia    Peripheral edema    Shortness of breath    Subclavian artery stenosis, left (HCC)    Vitamin D deficiency    Wears dentures    top   Wears glasses    reading   Past Surgical History:  Past Surgical History:  Procedure Laterality Date   BREAST BIOPSY     BREAST LUMPECTOMY     right 2015   BREAST LUMPECTOMY WITH RADIOACTIVE SEED LOCALIZATION Right 12/20/2013   Procedure: RIGHT BREAST LUMPECTOMY WITH RADIOACTIVE SEED LOCALIZATION;  Surgeon: Erroll Luna, MD;  Location: [place];  Service: General;  Laterality: Right;   CAROTID DUPLEX  2014   CATARACT EXTRACTION     both eyes   HPI:  Patient is an 88 y.o. female with PMH: COPD with chronic hypoxic respiratory failure, a-fib, CKD stage IV, GERD. She presented to the hospital from home on 01/02/22 with worsening SOB and cough with patient reporting productive cough of grayish sputum about two days prior to admission which then changed to yellowish. She had to increase her oxygen requirement from 3L to 4L without any relief. In ED, she was hypoxic at 85%  on 3L, febrile at 101.5, RR 30 and HR 112. CXR showed patchy airspace opacities at bilateral lung bases concerning for PNA.    Assessment / Plan / Recommendation  Clinical Impression  Patient not  currently presenting with any overt s/s of dysphagia as per this bedside swallow evaluation. She consumed thin liquids via straw sips with timely swallow initiation observed and no overt s/s aspiration or penetration. No changes in vitals or voice. MBS was completed in 2021 during a past hospitalization with normal oral and pharyngeal phase of swallow but suspected esophageal phase dysphagia; esophageal assessment/esophagram was recommended. Patient reports that she still uses strategies learned from [facility] a few years ago which recommended taking some bites of solids (she uses yogurt) to help pills transit through esophagus. SLP does not highly suspect that patient has had significant changes in her swallow function but with her h/o GERD and COPD with supplemental oxygen requirement, she will continue to be at risk for aspiration.(most likely post-prandial aspiration secondary to GERD). SLP not recommending further skilled intervention but recommend consideration of an esophagram as was recommended from MBS. SLP Visit Diagnosis: Dysphagia, unspecified (R13.10)    Aspiration Risk  Mild aspiration risk    Diet Recommendation Regular;Thin liquid   Liquid Administration via: Cup;Straw Medication Administration: Whole meds with liquid Supervision: Patient able to self feed Compensations: Slow rate;Small sips/bites Postural Changes: Remain upright for at least 30 minutes after po intake;Seated upright at 90 degrees    Other  Recommendations Recommended Consults: Consider esophageal assessment Oral Care Recommendations: Oral care BID  Recommendations for follow up therapy are one component of a multi-disciplinary discharge planning process, led by the attending physician.  Recommendations may be updated based on patient status, additional functional criteria and insurance authorization.  Follow up Recommendations No SLP follow up      Assistance Recommended at Discharge    Functional Status  Assessment Patient has not had a recent decline in their functional status  Frequency and Duration   [name]         Prognosis   [name]     Swallow Study   General Date of Onset: 01/02/22 HPI: Patient is an 88 y.o. female with PMH: COPD with chronic hypoxic respiratory failure, a-fib, CKD stage IV, GERD. She presented to the hospital from home on 01/02/22 with worsening SOB and cough with patient reporting productive cough of grayish sputum about two days prior to admission which then changed to yellowish. She had to increase her oxygen requirement from 3L to 4L without any relief. In ED, she was hypoxic at 85%  on 3L, febrile at 101.5, RR 30 and HR 112. CXR showed patchy airspace opacities at bilateral lung bases concerning for PNA. Type of Study: Bedside Swallow Evaluation Previous Swallow Assessment: MBS during previous visit 2021 Diet Prior to this Study: Regular;Thin liquids Temperature Spikes Noted: No Respiratory Status: Nasal cannula History of Recent Intubation: No Behavior/Cognition: Alert;Cooperative;Pleasant mood Oral Cavity Assessment: Within Functional Limits Oral Care Completed by SLP: No Vision: Functional for self-feeding Self-Feeding Abilities: Able to feed self Patient Positioning: Upright in bed Baseline Vocal Quality: Normal Volitional Cough: Strong Volitional Swallow: Able to elicit    Oral/Motor/Sensory Function Overall Oral Motor/Sensory Function: Within functional limits   Ice Chips     Thin Liquid Thin Liquid: Within functional limits Presentation: Straw;Self Fed    Nectar Thick     Honey Thick     Puree Puree: Not tested   Solid     Solid: Not tested      Sonia Baller, MA, CCC-SLP Speech Therapy

## 2022-01-04 NOTE — Progress Notes (Signed)
PROGRESS NOTE    Courtney Cline  KAJ:[id number] DOB: [birth year] DOA: 01/02/2022 PCP: Leeroy Cha, MD   Brief Narrative:  The patient is an 88 year old obese Caucasian female with a past medical history significant for but not limited to history of right breast cancer status postlumpectomy, as well as completed anastrozole in remission, history of iron deficiency anemia status post Retacrit and IV Venofer, history of atrial fibrillation on Eliquis, CKD stage IV, COPD with chronic hypoxic respiratory failure with 3 L as well as other comorbidities who presents with worsening shortness of breath and cough.  She stated that she had increased shortness of breath with productive cough of grayish sputum about 2 days ago which then changed to yellowish.  She states that she had some midsternal mild chest pain and denies any nausea or vomiting or diarrhea.  She has had a history of tobacco abuse in the past but quit 20 years ago and is chronically on 3 L of supplemental oxygen for her COPD.  She had to increase her oxygen requirement to 4 L without an relief.  She had no evidence of lower extremity edema.  While she was in the ED she was found to be febrile up to 101.5 had a heart rate of 112 and respiratory rate of 30 and initially was hypoxic at 85% on 3 L.  WBC was slightly elevated at 10.6.  She was negative for flu and COVID via PCR but chest x-ray did show patchy airspace opacities at bilateral lung bases concerning for pneumonia.  She also had a small pleural effusion.  She is improving and will need a repeat chest x-ray and ambulatory home O2 screen prior to discharge.  Assessment and Plan:  Acute on chronic respiratory failure with hypoxia (HCC) 2/2 to Community Acquired Pneumonia superimposed on COPD -Patient with chronic hypoxemia on 3 L secondary to COPD.  Requiring up to 3.5 L in the ED. -Secondary to community-acquired pneumonia seen with bibasilar opacity on chest x-ray with  fever and leukocytosis. -SpO2: 97 % O2 Flow Rate (L/min): 3 L/min -Continuous Pulse Oximetry and Maintain O2 Sats >90% -Continue treatment with IV Rocephin and azithromycin -Continue with Breo Ellipta as well as Incruse Ellipta -Patient has albuterol neb 3 mL IH every 6 as needed for wheezing or shortness of breath -WBC went from 10.6 -> 7.8 -> 11.0 -Lactic acid level went from 2.1 is now 1.7 -Added guaifenesin 1200 mg p.o. twice daily,, flutter valve and incentive spirometry -Obtain SLP given that patient has had previous aspirations and this is pending -Wean down to home 3 L as tolerated with goal O2 greater than 88% -She will need an ambulatory home O2 screen prior to discharge and repeat chest x-ray in the a.m. -PT/OT to evaluate and Treat    Sepsis ([place]) 2/2 to PNA -Secondary to community-acquired pneumonia -Presented with fever, tachycardia and leukocytosis with findings of bilateral opacities seen on chest x-ray -Continue treatment with IV Rocephin and Azithromycin -SLP evaluation pending -U/A done and showed Hazy Appearance, Moderate Hgb, Moderate Leukocytes, Rare Bacteria 0-5 RBC/HPF, 0-5 Squamous Epithelial Cells, and 0-5 WBC -Patient's WBC went from 10.6 -> 7.8 -> 11.0 -Repeat CXR this AM done and showed "Chronic Emphysema with ongoing bibasilar opacity, mildly increased on the right. Consider acute infectious exacerbation, and small bilateral pleural effusions are possible."   Elevated Troponin -Mild Troponin elevation at 54 and trending down to 49.   -Suspect likely demand ischemia from sepsis. -No CP but if develops will consider  Cardiology consultation    CKD Stage IV -Creatinine of 1.69  on admission  -Likely prerenal; patient's BUNs/creatinine is now 25/1.52 -> 34/1.36 -She received a lactated Ringer's bolus of 1 L and placed on maintenance IV fluid hydration at 75 MLS per hour for 20 hours which we will let expire  Can avoid further nephrotoxic medications, contrast  dyes, hypotension and dehydration to ensure adequate renal perfusion -We will continue monitor renal function carefully and repeat CMP in a.m.   Persistent Atrial Fibrillation ([place]) -Placed on the medical telemetry floor -C/w Metoprolol Tartrate 25 mg po BID  -C/w Apixaban 2.5 mg po BID    Hyperlipidemia -Continue Rosuvastatin 40 mg p.o. daily   Essential Hypertension -Currently Normotensive -Continue Home Metoprolol Tartrate 25 mg po BID -Continue to Monitor BP per protocol -Last BP reading was 156/50   Hypoalbuminemia -Patient's Albumin Level is now 2.9 -> 2.4 -Continue to Monitor and Trend and Repeat CMP in the AM   Normocytic Anemia -Patient's hemoglobin/hematocrit has gone from 9.1/29.6 -> 9.2/27.0 -> 8.1/26.6 -> 8.0/26.4 -Check Anemia Panel and pending  -Continue to Monitor for S/Sx of Bleeding; No overt bleeding noted -Repeat CBC in the AM    Obesity -Complicates overall prognosis and care -Estimated body mass index is 30.27 kg/m as calculated from the following:   Height as of this encounter: 5' (1.524 m).   Weight as of this encounter: 70.3 kg.  -Weight Loss and Dietary Counseling given  DVT prophylaxis: apixaban (ELIQUIS) tablet 2.5 mg Start: 01/02/22 2245 apixaban (ELIQUIS) tablet 2.5 mg    Code Status: DNR Family Communication: No family present at bedside   Disposition Plan:  Level of care: Telemetry Medical Status is: Inpatient Remains inpatient appropriate because: Needs SLP evaluation and PT/OT to Evaluate and Treat   Consultants:  None  Procedures:  As delineated as above  Antimicrobials:  Anti-infectives (From admission, onward)    Start     Dose/Rate Route Frequency Ordered Stop   01/02/22 1900  cefTRIAXone (ROCEPHIN) 2 g in sodium chloride 0.9 % 100 mL IVPB        2 g 200 mL/hr over 30 Minutes Intravenous Every 24 hours 01/02/22 1857 01/07/22 1859   01/02/22 1900  azithromycin (ZITHROMAX) 500 mg in sodium chloride 0.9 % 250 mL IVPB         500 mg 250 mL/hr over 60 Minutes Intravenous Every 24 hours 01/02/22 1857 01/07/22 1859       Subjective: Seen and examined at bedside she states she had a "spell" this AM and had uncontrolled coughing. States she feels about 60% improved and slept ok. No CP or SOB. Awaiting to take her pills with applesauce. No lightheadedness or dizziness. No other concerns or complaints at this time.   Objective: Vitals:   01/04/22 0537 01/04/22 0655 01/04/22 0801 01/04/22 0820  BP: (!) 132/56 (!) 144/49  (!) 156/50  Pulse: 78 80  77  Resp: '14 20  16  '$ Temp: 97.8 F (36.6 C) 97.8 F (36.6 C)  97.8 F (36.6 C)  TempSrc: Oral Oral  Oral  SpO2: 98% 97% 98% 97%  Weight:      Height:        Intake/Output Summary (Last 24 hours) at 01/04/2022 0920 Last data filed at 01/04/2022 0600 Gross per 24 hour  Intake 1058.65 ml  Output 400 ml  Net 658.65 ml   Filed Weights   01/02/22 1900  Weight: 70.3 kg   Examination: Physical Exam:  Constitutional: WN/WD obese  Caucasian elderly female in NAD appears calm Respiratory: Diminished to auscultation bilaterally with coarse breath sounds an some slight crackles and rhonchi, no wheezing, rales. Normal respiratory effort and patient is not tachypenic. No accessory muscle use. Unlabored breathing but wearing supplemental O2 via [place] Cardiovascular: RRR, no murmurs / rubs / gallops. S1 and S2 auscultated. Minimal edema  Abdomen: Soft, non-tender, distended 2/2 to body habitus. Bowel sounds positive.  GU: Deferred. Musculoskeletal: No clubbing / cyanosis of digits/nails. No joint deformity upper and lower extremities.  Skin: No rashes, lesions, ulcers on a limited skin evaluation. No induration; Warm and dry.  Neurologic: CN 2-12 grossly intact with no focal deficits. Romberg sign and cerebellar reflexes not assessed.  Psychiatric: Normal judgment and insight. Alert and oriented x 3. Normal mood and appropriate affect.   Data Reviewed: I have personally  reviewed following labs and imaging studies  CBC: Recent Labs  Lab 01/02/22 1917 01/02/22 1939 01/03/22 0421 01/04/22 0321  WBC 10.6*  --  7.8 11.0*  NEUTROABS 9.0*  --   --  9.5*  HGB 9.1* 9.2* 8.1* 8.0*  HCT 29.6* 27.0* 26.6* 26.4*  MCV 98.7  --  99.6 99.6  PLT 215  --  196 810   Basic Metabolic Panel: Recent Labs  Lab 01/02/22 1917 01/02/22 1939 01/03/22 0421 01/04/22 0321  NA 136 135 138 141  K 3.8 3.8 4.2 4.6  CL 100  --  103 105  CO2 24  --  25 27  GLUCOSE 167*  --  161* 103*  BUN 23  --  25* 34*  CREATININE 1.69*  --  1.52* 1.36*  CALCIUM 9.0  --  8.9 8.8*  MG  --   --   --  2.6*  PHOS  --   --   --  3.7   GFR: Estimated Creatinine Clearance: 26 mL/min (A) (by C-G formula based on SCr of 1.36 mg/dL (H)). Liver Function Tests: Recent Labs  Lab 01/02/22 1917 01/04/22 0321  AST 17 19  ALT 9 13  ALKPHOS 57 51  BILITOT 0.8 0.3  PROT 6.5 5.7*  ALBUMIN 2.9* 2.4*   No results for input(s): "LIPASE", "AMYLASE" in the last 168 hours. No results for input(s): "AMMONIA" in the last 168 hours. Coagulation Profile: Recent Labs  Lab 01/02/22 1917  INR 1.3*   Cardiac Enzymes: No results for input(s): "CKTOTAL", "CKMB", "CKMBINDEX", "TROPONINI" in the last 168 hours. BNP (last 3 results) No results for input(s): "PROBNP" in the last 8760 hours. HbA1C: No results for input(s): "HGBA1C" in the last 72 hours. CBG: No results for input(s): "GLUCAP" in the last 168 hours. Lipid Profile: No results for input(s): "CHOL", "HDL", "LDLCALC", "TRIG", "CHOLHDL", "LDLDIRECT" in the last 72 hours. Thyroid Function Tests: No results for input(s): "TSH", "T4TOTAL", "FREET4", "T3FREE", "THYROIDAB" in the last 72 hours. Anemia Panel: No results for input(s): "VITAMINB12", "FOLATE", "FERRITIN", "TIBC", "IRON", "RETICCTPCT" in the last 72 hours. Sepsis Labs: Recent Labs  Lab 01/02/22 1911 01/02/22 2140  LATICACIDVEN 2.1* 1.7    Recent Results (from the past 240  hour(s))  Resp Panel by RT-PCR (Flu A&B, Covid) Anterior Nasal Swab     Status: None   Collection Time: 01/02/22  6:57 PM   Specimen: Anterior Nasal Swab  Result Value Ref Range Status   SARS Coronavirus 2 by RT PCR NEGATIVE NEGATIVE Final    Comment: (NOTE) SARS-CoV-2 target nucleic acids are NOT DETECTED.  The SARS-CoV-2 RNA is generally detectable in upper respiratory specimens  during the acute phase of infection. The lowest concentration of SARS-CoV-2 viral copies this assay can detect is 138 copies/mL. A negative result does not preclude SARS-Cov-2 infection and should not be used as the sole basis for treatment or other patient management decisions. A negative result may occur with  improper specimen collection/handling, submission of specimen other than nasopharyngeal swab, presence of viral mutation(s) within the areas targeted by this assay, and inadequate number of viral copies(<138 copies/mL). A negative result must be combined with clinical observations, patient history, and epidemiological information. The expected result is Negative.  Fact Sheet for Patients:  EntrepreneurPulse.com.au  Fact Sheet for Healthcare Providers:  IncredibleEmployment.be  This test is no t yet approved or cleared by the [place] FDA and  has been authorized for detection and/or diagnosis of SARS-CoV-2 by FDA under an Emergency Use Authorization (EUA). This EUA will remain  in effect (meaning this test can be used) for the duration of the COVID-19 declaration under Section 564(b)(1) of the Act, 21 U.S.C.section 360bbb-3(b)(1), unless the authorization is terminated  or revoked sooner.       Influenza A by PCR NEGATIVE NEGATIVE Final   Influenza B by PCR NEGATIVE NEGATIVE Final    Comment: (NOTE) The Xpert Xpress SARS-CoV-2/FLU/RSV plus assay is intended as an aid in the diagnosis of influenza from Nasopharyngeal swab specimens and should not be  used as a sole basis for treatment. Nasal washings and aspirates are unacceptable for Xpert Xpress SARS-CoV-2/FLU/RSV testing.  Fact Sheet for Patients: EntrepreneurPulse.com.au  Fact Sheet for Healthcare Providers: IncredibleEmployment.be  This test is not yet approved or cleared by the [place] FDA and has been authorized for detection and/or diagnosis of SARS-CoV-2 by FDA under an Emergency Use Authorization (EUA). This EUA will remain in effect (meaning this test can be used) for the duration of the COVID-19 declaration under Section 564(b)(1) of the Act, 21 U.S.C. section 360bbb-3(b)(1), unless the authorization is terminated or revoked.  Performed at [facility], [place] [address] [place], [place] [address]   Blood Culture (routine x 2)     Status: None (Preliminary result)   Collection Time: 01/02/22  7:07 PM   Specimen: BLOOD  Result Value Ref Range Status   Specimen Description BLOOD RIGHT ANTECUBITAL  Final   Special Requests   Final    BOTTLES DRAWN AEROBIC AND ANAEROBIC Blood Culture results may not be optimal due to an inadequate volume of blood received in culture bottles   Culture   Final    NO GROWTH 2 DAYS Performed at [facility], [place] [address], [place], [place] [address]    Report Status PENDING  Incomplete  Blood Culture (routine x 2)     Status: None (Preliminary result)   Collection Time: 01/02/22  7:12 PM   Specimen: BLOOD LEFT HAND  Result Value Ref Range Status   Specimen Description BLOOD LEFT HAND  Final   Special Requests   Final    BOTTLES DRAWN AEROBIC AND ANAEROBIC Blood Culture results may not be optimal due to an excessive volume of blood received in culture bottles   Culture   Final    NO GROWTH 2 DAYS Performed at [facility], [place] [address] [place], [place] [address]    Report Status PENDING  Incomplete    Radiology Studies: DG CHEST PORT 1 VIEW  Result Date:  01/04/2022 CLINICAL DATA:  88 year old female with shortness of breath.  COPD. EXAM: PORTABLE CHEST 1 VIEW  COMPARISON:  Portable chest 01/02/2022 and earlier. FINDINGS: Portable AP upright view at 0555 hours. Stable pulmonary hyperinflation with emphysema demonstrated by CT in 2021. Stable cardiac size and mediastinal contours. Ongoing patchy bibasilar lung opacity, mildly progressed on the right. Small bilateral pleural effusions are possible. No air bronchograms, pneumothorax or pulmonary edema. Stable visualized osseous structures. Paucity of bowel gas in the visible upper abdomen. IMPRESSION: Chronic Emphysema (ICD10-J43.9) with ongoing bibasilar opacity, mildly increased on the right. Consider acute infectious exacerbation, and small bilateral pleural effusions are possible. Electronically Signed   By: Genevie Ann M.D.   On: 01/04/2022 06:24   DG Chest Port 1 View  Result Date: 01/02/2022 CLINICAL DATA:  Questionable sepsis EXAM: PORTABLE CHEST 1 VIEW COMPARISON:  Chest x-ray 02/15/2021 FINDINGS: There are patchy airspace opacities in both lung bases. There is a small left pleural effusion. Cardiomediastinal silhouette is within normal limits. No acute fractures are seen. IMPRESSION: 1. Patchy airspace opacities in both lung bases, concerning for infection. 2. Small left pleural effusion. Electronically Signed   By: Ronney Asters M.D.   On: 01/02/2022 19:51    Scheduled Meds:  apixaban  2.5 mg Oral BID   cholecalciferol  2,000 Units Oral Daily   fluticasone furoate-vilanterol  1 puff Inhalation Daily   And   umeclidinium bromide  1 puff Inhalation Daily   guaiFENesin  1,200 mg Oral BID   metoprolol tartrate  25 mg Oral BID   rosuvastatin  40 mg Oral Daily   Continuous Infusions:  azithromycin 500 mg (01/03/22 2040)   cefTRIAXone (ROCEPHIN)  IV 2 g (01/03/22 1938)    LOS: 2 days   Raiford Noble, DO [facility] Available via Epic secure chat 7am-7pm After these hours, please refer to  coverage provider listed on [email or web address] 01/04/2022, 9:20 AM

## 2022-01-05 ENCOUNTER — Inpatient Hospital Stay (HOSPITAL_COMMUNITY): Payer: Medicare HMO

## 2022-01-05 ENCOUNTER — Other Ambulatory Visit (HOSPITAL_COMMUNITY): Payer: Self-pay

## 2022-01-05 ENCOUNTER — Encounter: Payer: Self-pay | Admitting: Hematology

## 2022-01-05 DIAGNOSIS — E782 Mixed hyperlipidemia: Secondary | ICD-10-CM | POA: Diagnosis not present

## 2022-01-05 DIAGNOSIS — R7989 Other specified abnormal findings of blood chemistry: Secondary | ICD-10-CM | POA: Diagnosis not present

## 2022-01-05 DIAGNOSIS — J9621 Acute and chronic respiratory failure with hypoxia: Secondary | ICD-10-CM | POA: Diagnosis not present

## 2022-01-05 DIAGNOSIS — J189 Pneumonia, unspecified organism: Secondary | ICD-10-CM | POA: Diagnosis not present

## 2022-01-05 LAB — CBC WITH DIFFERENTIAL/PLATELET
Abs Immature Granulocytes: 0.02 10*3/uL (ref 0.00–0.07)
Basophils Absolute: 0 10*3/uL (ref 0.0–0.1)
Basophils Relative: 0 %
Eosinophils Absolute: 0.2 10*3/uL (ref 0.0–0.5)
Eosinophils Relative: 3 %
HCT: 26.1 % — ABNORMAL LOW (ref 36.0–46.0)
Hemoglobin: 8.2 g/dL — ABNORMAL LOW (ref 12.0–15.0)
Immature Granulocytes: 0 %
Lymphocytes Relative: 17 %
Lymphs Abs: 1.1 10*3/uL (ref 0.7–4.0)
MCH: 30.8 pg (ref 26.0–34.0)
MCHC: 31.4 g/dL (ref 30.0–36.0)
MCV: 98.1 fL (ref 80.0–100.0)
Monocytes Absolute: 0.6 10*3/uL (ref 0.1–1.0)
Monocytes Relative: 9 %
Neutro Abs: 4.4 10*3/uL (ref 1.7–7.7)
Neutrophils Relative %: 71 %
Platelets: 225 10*3/uL (ref 150–400)
RBC: 2.66 MIL/uL — ABNORMAL LOW (ref 3.87–5.11)
RDW: 14.2 % (ref 11.5–15.5)
WBC: 6.2 10*3/uL (ref 4.0–10.5)
nRBC: 0 % (ref 0.0–0.2)

## 2022-01-05 LAB — COMPREHENSIVE METABOLIC PANEL
ALT: 23 U/L (ref 0–44)
AST: 32 U/L (ref 15–41)
Albumin: 2.4 g/dL — ABNORMAL LOW (ref 3.5–5.0)
Alkaline Phosphatase: 51 U/L (ref 38–126)
Anion gap: 13 (ref 5–15)
BUN: 34 mg/dL — ABNORMAL HIGH (ref 8–23)
CO2: 25 mmol/L (ref 22–32)
Calcium: 8.7 mg/dL — ABNORMAL LOW (ref 8.9–10.3)
Chloride: 104 mmol/L (ref 98–111)
Creatinine, Ser: 1.28 mg/dL — ABNORMAL HIGH (ref 0.44–1.00)
GFR, Estimated: 41 mL/min — ABNORMAL LOW (ref >60)
Glucose, Bld: 91 mg/dL (ref 70–99)
Potassium: 4.3 mmol/L (ref 3.5–5.1)
Sodium: 142 mmol/L (ref 135–145)
Total Bilirubin: 0.4 mg/dL (ref 0.3–1.2)
Total Protein: 5.6 g/dL — ABNORMAL LOW (ref 6.5–8.1)

## 2022-01-05 LAB — MAGNESIUM: Magnesium: 2.5 mg/dL — ABNORMAL HIGH (ref 1.7–2.4)

## 2022-01-05 LAB — PHOSPHORUS: Phosphorus: 2.7 mg/dL (ref 2.5–4.6)

## 2022-01-05 MED ORDER — GUAIFENESIN 100 MG/5ML PO LIQD
30.0000 mL | Freq: Four times a day (QID) | ORAL | Status: DC
  Administered 2022-01-05: 30 mL via ORAL
  Filled 2022-01-05 (×2): qty 30

## 2022-01-05 MED ORDER — OXYMETAZOLINE HCL 0.05 % NA SOLN
2.0000 | Freq: Two times a day (BID) | NASAL | Status: DC | PRN
  Administered 2022-01-05: 2 via NASAL
  Filled 2022-01-05: qty 30

## 2022-01-05 MED ORDER — CEFDINIR 300 MG PO CAPS
300.0000 mg | ORAL_CAPSULE | Freq: Every day | ORAL | 0 refills | Status: AC
  Filled 2022-01-05: qty 3, 3d supply, fill #0

## 2022-01-05 MED ORDER — GUAIFENESIN 100 MG/5ML PO LIQD: 30.0000 mL | Freq: Four times a day (QID) | ORAL | Status: DC

## 2022-01-05 MED ORDER — SALINE SPRAY 0.65 % NA SOLN
1.0000 | NASAL | Status: DC | PRN
  Administered 2022-01-05: 1 via NASAL
  Filled 2022-01-05: qty 44

## 2022-01-05 NOTE — TOC Initial Note (Signed)
Transition of Care [facility]) - Initial/Assessment Note    Patient Details  Name: Courtney Cline MRN: [id number] Date of Birth: 1933/05/17  Transition [facility] CM/SW Contact:    Teena Irani Transition of Care Supervisor Phone Number: [phone] 01/05/2022, 9:29 AM  Clinical Narrative:     Patient lives at home with family. PCP - [facility] - "Dr [place] Bibles." Has private insurance with [facility] Medicare with prescription drug coverage. Pharmacy of choice is [facility]. Has home O2 with [employer] at home - walker and bedside commode. Patient  has had [facility] in the past and request to use them again. Amy with [facility] called - they have availability and can accept the referral.             Expected Discharge Plan: [place] Barriers to Discharge: No Barriers Identified   Patient Goals and CMS Choice Patient states their goals for this hospitalization and ongoing recovery are:: to get stronger CMS Medicare.gov Compare Post Acute Care list provided to:: Patient Choice offered to / list presented to : Patient  Expected Discharge Plan and Services Expected Discharge Plan: [place]   Discharge Planning Services: CM Consult Post Acute Care Choice: [place] arrangements for the past 2 months: Single Family Home Expected Discharge Date: 01/05/22                 DME Agency: NA       HH Arranged: PT, OT HH Agency: [facility] Date HH Agency Contacted: 01/05/22 Time [place]: [phone] Representative spoke with at [place]: [place] Arrangements/Services Living arrangements for the past 2 months: [place] with:: Spouse Patient language and need for interpreter reviewed:: No Do you feel safe going back to the place where you live?: Yes      Need for Family Participation in Patient Care: Yes (Comment) (Good  family support - son)     Criminal Activity/Legal  Involvement Pertinent to Current Situation/Hospitalization: No - Comment as needed  Activities of Daily Living Home Assistive Devices/Equipment: None ADL Screening (condition at time of admission) Patient's cognitive ability adequate to safely complete daily activities?: Yes Is the patient deaf or have difficulty hearing?: No Does the patient have difficulty seeing, even when wearing glasses/contacts?: Yes Does the patient have difficulty concentrating, remembering, or making decisions?: No Patient able to express need for assistance with ADLs?: Yes Does the patient have difficulty dressing or bathing?: Yes Independently performs ADLs?: No Communication: Needs assistance Does the patient have difficulty walking or climbing stairs?: No Weakness of Legs: Both Weakness of Arms/Hands: None  Permission Sought/Granted Permission sought to share information with : Case Manager Permission granted to share information with : Yes, Verbal Permission Granted     Permission granted to share info w AGENCY: [facility]        Emotional Assessment Appearance:: Appears stated age Attitude/Demeanor/Rapport: Gracious Affect (typically observed): Accepting Orientation: : Oriented to Self, Oriented to Place, Oriented to  Time, Oriented to Situation Alcohol / Substance Use: Not Applicable Psych Involvement: No (comment)  Admission diagnosis:  Acute on chronic respiratory failure with hypoxia ([place]) [J96.21] Community acquired pneumonia, unspecified laterality [J18.9] Sepsis, due to unspecified organism, unspecified whether acute organ dysfunction present [facility]) [A41.9] Patient Active Problem List   Diagnosis Date Noted   Acute on chronic respiratory failure with hypoxia ([place]) 01/02/2022   Elevated troponin 01/02/2022   Sepsis (  [place]) 01/02/2022   Aortic stenosis 05/06/2021   Malnutrition of moderate degree 02/16/2021   AKI (acute kidney injury) ([place]) 02/15/2021   CAP (community acquired pneumonia)  01/21/2020   Acute cystitis without hematuria    Persistent atrial fibrillation ([place]) 08/20/2019   COPD without exacerbation ([place]) 10/15/2018   Healthcare maintenance 10/15/2018   Physical deconditioning 10/15/2018   Sinusitis 58/10/9831   Diastolic CHF ([place]) 82/50/5397   Vitamin D deficiency    Allergic rhinitis 11/23/2015   Osteopenia with high risk of fracture 11/05/2015   Post-nasal drip 03/16/2015   Anemia of chronic disease 03/10/2015   Subclavian artery stenosis, left ([place]) 04/24/2014   Bilateral lower extremity edema 04/24/2014   Essential hypertension 04/24/2014   Hyperlipidemia 04/24/2014   Breast cancer of upper-inner quadrant of right female breast ([place]) 11/19/2013   Chronic hypoxemic respiratory failure ([place]) 10/02/2013   PCP:  Leeroy Cha, MD Pharmacy:   [place], [place] [place] [place] [address] Phone: [phone] Fax: [place], [place] [address] [place] [address] Phone: [phone] Fax: [phone]  [facility] [address] [address] Phone: [phone] Fax: [phone]     Social Determinants of Health (SDOH) Interventions    Readmission Risk Interventions     No data to display

## 2022-01-05 NOTE — Progress Notes (Signed)
Physical Therapy Treatment Patient Details Name: Courtney Cline MRN: [id number] DOB: 1933/05/24 Today's Date: 01/05/2022   History of Present Illness Pt is 88 year old presented to [facility] on  01/02/22 with acute on chronic respiratory failure with hypoxia and sepsis due to PNA superimposed on COPD. PMH - copd, breast CA, ckd, obesity, afib.    PT Comments    Doing much better with mobility today. Tolerated 95 feet with RW and supervision for safety. SpO2 93% on 3L supplemental O2. No overt loss of balance, demonstrating adequate use of RW for support. All questions answered, family in room and supportive. Pt eager to return home. Will follow until d/c.   Recommendations for follow up therapy are one component of a multi-disciplinary discharge planning process, led by the attending physician.  Recommendations may be updated based on patient status, additional functional criteria and insurance authorization.  Follow Up Recommendations  Home health PT     Assistance Recommended at Discharge Intermittent Supervision/Assistance  Patient can return home with the following A little help with walking and/or transfers;A little help with bathing/dressing/bathroom;Assist for transportation;Help with stairs or ramp for entrance   Equipment Recommendations  None recommended by PT    Recommendations for Other Services       Precautions / Restrictions Precautions Precautions: Fall Precaution Comments: O2 and HR Restrictions Weight Bearing Restrictions: No     Mobility  Bed Mobility               General bed mobility comments: in recliner    Transfers Overall transfer level: Needs assistance Equipment used: Rolling walker (2 wheels) Transfers: Sit to/from Stand Sit to Stand: Supervision           General transfer comment: supervision for safety. no assist needed    Ambulation/Gait Ambulation/Gait assistance: Supervision Gait Distance (Feet): 95 Feet Assistive  device: Rolling walker (2 wheels) Gait Pattern/deviations: Step-through pattern, Trunk flexed Gait velocity: decreased Gait velocity interpretation: <1.8 ft/sec, indicate of risk for recurrent falls   General Gait Details: Educated on safe AD use with RW, cues for walker placement and upright posture for energy conservation. No overt instability noted. Supervision for safety. SpO2 on 3L at 93%, HR mid 90s. Easily fatigued with distance, required one standing rest period. Cues for breathing techniques.   Stairs             Wheelchair Mobility    Modified Rankin (Stroke Patients Only)       Balance Overall balance assessment: Needs assistance Sitting-balance support: No upper extremity supported, Feet supported Sitting balance-Leahy Scale: Good     Standing balance support: No upper extremity supported Standing balance-Leahy Scale: Fair                              Cognition Arousal/Alertness: Awake/alert Behavior During Therapy: WFL for tasks assessed/performed Overall Cognitive Status: Within Functional Limits for tasks assessed                                          Exercises      General Comments General comments (skin integrity, edema, etc.): SpO2 94% at rest on 3L      Pertinent Vitals/Pain Pain Assessment Pain Assessment: No/denies pain    Home Living Family/patient expects to be discharged to:: Private residence Living Arrangements: Spouse/significant other Available Help at Discharge:  Family;Available 24 hours/day Type of Home: House Home Access: Stairs to enter Entrance Stairs-Rails: Right;Left;Can reach both Entrance Stairs-Number of Steps: 3   Home Layout: One level Home Equipment: Conservation officer, nature (2 wheels);Shower seat;Wheelchair - manual;BSC/3in1 Additional Comments: on 3L O2 at home    Prior Function            PT Goals (current goals can now be found in the care plan section) Acute Rehab PT Goals Patient  Stated Goal: return home PT Goal Formulation: With patient Time For Goal Achievement: 01/18/22 Potential to Achieve Goals: Good Progress towards PT goals: Progressing toward goals    Frequency    Min 3X/week      PT Plan Current plan remains appropriate    Co-evaluation              AM-PAC PT "6 Clicks" Mobility   Outcome Measure  Help needed turning from your back to your side while in a flat bed without using bedrails?: None Help needed moving from lying on your back to sitting on the side of a flat bed without using bedrails?: None Help needed moving to and from a bed to a chair (including a wheelchair)?: None Help needed standing up from a chair using your arms (e.g., wheelchair or bedside chair)?: None Help needed to walk in hospital room?: A Little Help needed climbing 3-5 steps with a railing? : A Little 6 Click Score: 22    End of Session Equipment Utilized During Treatment: Oxygen Activity Tolerance: Patient tolerated treatment well Patient left: with call bell/phone within reach;in chair;with family/visitor present   PT Visit Diagnosis: Unsteadiness on feet (R26.81);Other abnormalities of gait and mobility (R26.89);Muscle weakness (generalized) (M62.81)     Time: 4801-6553 PT Time Calculation (min) (ACUTE ONLY): 12 min  Charges:  $Gait Training: 8-22 mins                     Candie Mile, PT, DPT Physical Therapist Acute Rehabilitation Services [place] 01/05/2022, 11:36 AM

## 2022-01-05 NOTE — Progress Notes (Signed)
   Home health services SELECT PROVIDER TYPE *  Patient survey rating Clear all filters Showing 1 - 15 of 15 home health agencies serving your areaPhone number may be to the agency's main office Sort by:  Alphabetical 1. [facility] Quality rating Patient survey rating Compare 2. [facility] Quality rating Patient survey rating Compare 3. [facility] Quality rating Patient survey rating Compare 4. [facility] Quality rating Patient survey rating Compare 5. [facility] [phone] Quality rating Patient survey rating Compare 6. [facility] [phone] Quality rating Patient survey rating Compare 7. [facility] Quality rating Patient survey rating Compare 8. [facility] Quality rating Patient survey rating Compare 9. [name] Quality rating Not available4 Patient survey rating Not available12 Compare 10. [facility] rating Patient survey rating Compare 11. [facility] Quality rating Patient survey rating 11 Compare 12. [facility] Quality rating Patient survey rating Compare 13. [facility] rating Patient survey rating Compare 14. [facility] Quality rating Patient survey rating Compare 15. [facility] [phone] Quality rating Patient survey rating Compare Data last updated: December 08, 2021 To explore and download home health agency data,visit the data catalog on CMS.gov Finding a home health agency in your area Home health agencies provide care in your home, regardless of where their office is located. This list shows the home health agencies that serve your area, based on the location you entered in your search. About  Medicare Nondiscrimination/AccessibilityPrivacy PolicyPrivacy SettingLinking PolicyUsing this sitePlain Writing Department of Health and [place] website managed and paid for by the [employer] for [employer] and [employer]. [place], [place] [address] [address]

## 2022-01-05 NOTE — TOC Transition Note (Signed)
Transition of Care [facility]) - CM/SW Discharge Note   Patient Details  Name: Courtney Cline MRN: [id number] Date of Birth: 1933/10/21  Transition of Care [facility]) CM/SW Contact:  Teena Irani Transition of Care Supervisor Phone Number: [phone] 01/05/2022, 9:35 AM   Clinical Narrative:    Patient offered choice for [facility] services, patient requested [facility]. Amy with [name] called for arrangements. Pt has home 02, son brought portable 02 tank from home and he will provide transportation home. No other DME needed at this time.    Final next level of care: [place] Barriers to Discharge: No Barriers Identified   Patient Goals and CMS Choice Patient states their goals for this hospitalization and ongoing recovery are:: to get stronger CMS Medicare.gov Compare Post Acute Care list provided to:: Patient Choice offered to / list presented to : Patient  Discharge Placement                       Discharge Plan and Services   Discharge Planning Services: CM Consult Post Acute Care Choice: Home Health            DME Agency: NA       HH Arranged: PT, OT HH Agency: [place] Date [place]: 01/05/22 Time [place]: [phone] Representative spoke with at [place]: Amy  Social Determinants of Health ([place]) Interventions     Readmission Risk Interventions     No data to display

## 2022-01-05 NOTE — Progress Notes (Signed)
Patient given discharge instructions with son at bedside. Patient given instructions on follow up appointments, medications, and symptom management. Patient indicated understanding of information with no further questions. Patients IV removed and walked down with wheelchair by staff with O2 tank supplied in car to go home with family via private vehicle.

## 2022-01-05 NOTE — Plan of Care (Signed)
  Problem: Education: Goal: Knowledge of General Education information will improve Description: Including pain rating scale, medication(s)/side effects and non-pharmacologic comfort measures Outcome: Adequate for Discharge   Problem: Health Behavior/Discharge Planning: Goal: Ability to manage health-related needs will improve Outcome: Adequate for Discharge   Problem: Clinical Measurements: Goal: Ability to maintain clinical measurements within normal limits will improve Outcome: Adequate for Discharge   Problem: Activity: Goal: Risk for activity intolerance will decrease Outcome: Adequate for Discharge   Problem: Nutrition: Goal: Adequate nutrition will be maintained Outcome: Adequate for Discharge   Problem: Elimination: Goal: Will not experience complications related to bowel motility Outcome: Adequate for Discharge Goal: Will not experience complications related to urinary retention Outcome: Adequate for Discharge   Problem: Pain Managment: Goal: General experience of comfort will improve Outcome: Adequate for Discharge

## 2022-01-05 NOTE — Evaluation (Signed)
Occupational Therapy Evaluation Patient Details Name: Courtney Cline MRN: [id number] DOB: [birth year] Today's Date: 01/05/2022   History of Present Illness Pt is 88 year old presented to [facility] on  01/02/22 with acute on chronic respiratory failure with hypoxia and sepsis due to PNA superimposed on COPD. PMH - copd, breast CA, ckd, obesity, afib.   Clinical Impression   Patient admitted for the diagnosis above.  PTA she lives at home with her spouse.  Her husband has dementia, and requires oversight and cues.  At home she does not need an AD, but does require 3L of O2 at baseline.  She does not drive, and needed no assist with ADL or iADL.  Currently she gets SOB easily, which impacts her safety and balance.  OT will follow, and HH OT is recommended for post acute rehab.        Recommendations for follow up therapy are one component of a multi-disciplinary discharge planning process, led by the attending physician.  Recommendations may be updated based on patient status, additional functional criteria and insurance authorization.   Follow Up Recommendations  Home health OT     Assistance Recommended at Discharge Intermittent Supervision/Assistance  Patient can return home with the following A little help with bathing/dressing/bathroom;Help with stairs or ramp for entrance;Assist for transportation;Assistance with cooking/housework;A little help with walking and/or transfers    Functional Status Assessment  Patient has had a recent decline in their functional status and demonstrates the ability to make significant improvements in function in a reasonable and predictable amount of time.  Equipment Recommendations  None recommended by OT    Recommendations for Other Services       Precautions / Restrictions Precautions Precautions: Fall Precaution Comments: O2 and HR Restrictions Weight Bearing Restrictions: No      Mobility Bed Mobility Overal bed mobility: Modified  Independent               Patient Response: Cooperative  Transfers Overall transfer level: Needs assistance Equipment used: Rolling walker (2 wheels) Transfers: Sit to/from Stand, Bed to chair/wheelchair/BSC Sit to Stand: Min guard, Min assist     Step pivot transfers: Min assist            Balance Overall balance assessment: Needs assistance Sitting-balance support: No upper extremity supported, Feet supported Sitting balance-Leahy Scale: Good     Standing balance support: Reliant on assistive device for balance Standing balance-Leahy Scale: Poor                             ADL either performed or assessed with clinical judgement   ADL       Grooming: Wash/dry hands;Min guard;Standing               Lower Body Dressing: Minimal assistance;Sit to/from stand   Toilet Transfer: Minimal assistance;Rolling walker (2 wheels);Regular Toilet                   Vision Patient Visual Report: No change from baseline       Perception     Praxis      Pertinent Vitals/Pain Pain Assessment Pain Assessment: No/denies pain     Hand Dominance Right   Extremity/Trunk Assessment Upper Extremity Assessment Upper Extremity Assessment: Generalized weakness   Lower Extremity Assessment Lower Extremity Assessment: Defer to PT evaluation   Cervical / Trunk Assessment Cervical / Trunk Assessment: Kyphotic   Communication Communication Communication: No difficulties   Cognition  Arousal/Alertness: Awake/alert Behavior During Therapy: WFL for tasks assessed/performed Overall Cognitive Status: Within Functional Limits for tasks assessed                                       General Comments   SOB easily    Exercises     Shoulder Instructions      Home Living Family/patient expects to be discharged to:: Private residence Living Arrangements: Spouse/significant other Available Help at Discharge: Family;Available 24  hours/day Type of Home: House Home Access: Stairs to enter [employer] of Steps: 3 Entrance Stairs-Rails: Right;Left;Can reach both Home Layout: One level     Bathroom Shower/Tub: Teacher, early years/pre: Handicapped height Bathroom Accessibility: Yes   Home Equipment: Conservation officer, nature (2 wheels);Shower seat;Wheelchair - manual;BSC/3in1   Additional Comments: on 3L O2 at home      Prior Functioning/Environment Prior Level of Function : Independent/Modified Independent             Mobility Comments: does not use assistive device ADLs Comments: does IADLs, cares for spouse        OT Problem List: Impaired balance (sitting and/or standing);Decreased activity tolerance;Decreased strength      OT Treatment/Interventions: Self-care/ADL training;Therapeutic exercise;Therapeutic activities;Patient/family education;Balance training    OT Goals(Current goals can be found in the care plan section) Acute Rehab OT Goals Patient Stated Goal: Return home OT Goal Formulation: With patient Time For Goal Achievement: 01/19/22 Potential to Achieve Goals: Good ADL Goals Pt Will Perform Grooming: with modified independence;standing Pt Will Perform Lower Body Dressing: with modified independence;sit to/from stand Pt Will Transfer to Toilet: with modified independence;ambulating;regular height toilet  OT Frequency: Min 2X/week    Co-evaluation              AM-PAC OT "6 Clicks" Daily Activity     Outcome Measure Help from another person eating meals?: None Help from another person taking care of personal grooming?: A Little Help from another person toileting, which includes using toliet, bedpan, or urinal?: A Little Help from another person bathing (including washing, rinsing, drying)?: A Lot Help from another person to put on and taking off regular upper body clothing?: None Help from another person to put on and taking off regular lower body clothing?: A  Lot 6 Click Score: 18   End of Session Equipment Utilized During Treatment: Rolling walker (2 wheels);Oxygen Nurse Communication: Mobility status  Activity Tolerance: Patient tolerated treatment well Patient left: in chair;with call bell/phone within reach;with family/visitor present  OT Visit Diagnosis: Unsteadiness on feet (R26.81)                Time: 7106-2694 OT Time Calculation (min): 24 min Charges:  OT General Charges $OT Visit: 1 Visit OT Evaluation $OT Eval Moderate Complexity: 1 Mod OT Treatments $Self Care/Home Management : 8-22 mins  01/05/2022  RP, OTR/L  Acute Rehabilitation Services  Office:  [phone]   Metta Clines 01/05/2022, 9:02 AM

## 2022-01-05 NOTE — Discharge Summary (Signed)
PATIENT DETAILS Name: Courtney Cline Age: 88 y.o. Sex: female Date of Birth: 11/04/1933 MRN: [id number]. Admitting Physician: Orene Desanctis, DO YBW:[id number], Ronie Spies, MD  Admit Date: 01/02/2022 Discharge date: 01/05/2022  Recommendations for Outpatient Follow-up:  Follow up with PCP in 1-2 weeks Please obtain CMP/CBC in one week Please repeat two-view chest x-ray in 4 to 6 weeks to document resolution of PNA.  Admitted From:  Home  Disposition: Home health   Discharge Condition: fair  CODE STATUS:   Code Status: DNR   Diet recommendation:  Diet Order             Diet - low sodium heart healthy           Diet regular Room service appropriate? Yes; Fluid consistency: Thin  Diet effective now                    Brief Summary: 88 year old with history of COPD on 3 L of oxygen at home, A-fib on Eliquis, stage IV CKD who presented to the hospital with worsening shortness of breath/cough-she was found to have acute on chronic hypoxic respiratory failure with sepsis in the setting of PNA.  See below for further details.  Brief Hospital Course: Acute on chronic hypoxic respiratory failure Sepsis due to PNA Managed with supportive care-including bronchodilators/empiric antibiotics-with significant clinical improvement. She is requesting discharge home today-she is back on her usual regimen of 3 L of oxygen. COVID/influenza PCR and blood cultures are negative-plan is to transition from Rocephin/Zithromax to oral antimicrobial therapy. She was evaluated by SLP-recommendations were to continue regular diet with thin liquids.  Minimally elevated troponin Suspect demand ischemia No chest pain or any anginal symptoms For any further workup.  COPD Chronic hypoxic respiratory failure on 3 L of oxygen at home Not in exacerbation Continue usual bronchodilator regimen  CKD stage IV Creatinine at baseline Not felt to have AKI on admission (ruled  out)  Persistent atrial fibrillation Stable Continue metoprolol/Eliquis  HTN Stable Continue metoprolol  HLD Continue statin  Normocytic anemia Chronic issue Likely related to CKD No evidence of blood loss/GI bleeding Continue outpatient monitoring/follow-up with PCP.  Debility/deconditioning Due to acute illness Evaluated by PT/OT-Home health recommended.  Obesity: Estimated body mass index is 30.27 kg/m as calculated from the following:   Height as of this encounter: 5' (1.524 m).   Weight as of this encounter: 70.3 kg.   Discharge Diagnoses:  Principal Problem:   Acute on chronic respiratory failure with hypoxia (HCC) Active Problems:   Essential hypertension   Hyperlipidemia   Persistent atrial fibrillation (HCC)   CAP (community acquired pneumonia)   AKI (acute kidney injury) ([place])   Elevated troponin   Sepsis ([place])   Discharge Instructions:  Activity:  As tolerated with Full fall precautions use walker/cane & assistance as needed   Discharge Instructions     Call MD for:  difficulty breathing, headache or visual disturbances   Complete by: As directed    Diet - low sodium heart healthy   Complete by: As directed    Discharge instructions   Complete by: As directed    Follow with Primary MD  Leeroy Cha, MD in 1-2 weeks  Please get a complete blood count and chemistry panel checked by your Primary MD at your next visit, and again as instructed by your Primary MD.  Get Medicines reviewed and adjusted: Please take all your medications with you for your next visit with your Primary MD  Laboratory/radiological  data: Please request your Primary MD to go over all hospital tests and procedure/radiological results at the follow up, please ask your Primary MD to get all Hospital records sent to his/her office.  In some cases, they will be blood work, cultures and biopsy results pending at the time of your discharge. Please request that your  primary care M.D. follows up on these results.  Also Note the following: If you experience worsening of your admission symptoms, develop shortness of breath, life threatening emergency, suicidal or homicidal thoughts you must seek medical attention immediately by calling 911 or calling your MD immediately  if symptoms less severe.  You must read complete instructions/literature along with all the possible adverse reactions/side effects for all the Medicines you take and that have been prescribed to you. Take any new Medicines after you have completely understood and accpet all the possible adverse reactions/side effects.   Do not drive when taking Pain medications or sleeping medications (Benzodaizepines)  Do not take more than prescribed Pain, Sleep and Anxiety Medications. It is not advisable to combine anxiety,sleep and pain medications without talking with your primary care practitioner  Special Instructions: If you have smoked or chewed Tobacco  in the last 2 yrs please stop smoking, stop any regular Alcohol  and or any Recreational drug use.  Wear Seat belts while driving.  Please note: You were cared for by a hospitalist during your hospital stay. Once you are discharged, your primary care physician will handle any further medical issues. Please note that NO REFILLS for any discharge medications will be authorized once you are discharged, as it is imperative that you return to your primary care physician (or establish a relationship with a primary care physician if you do not have one) for your post hospital discharge needs so that they can reassess your need for medications and monitor your lab values.   Increase activity slowly   Complete by: As directed       Allergies as of 01/05/2022   No Known Allergies      Medication List     TAKE these medications    acetaminophen 325 MG tablet Commonly known as: TYLENOL Take 650 mg by mouth every 6 (six) hours as needed for mild  pain or headache.   apixaban 2.5 MG Tabs tablet Commonly known as: ELIQUIS Take 1 tablet (2.5 mg total) by mouth 2 (two) times daily.   cefdinir 300 MG capsule Commonly known as: OMNICEF Take 1 capsule (300 mg total) by mouth 2 (two) times daily for 3 days.   furosemide 40 MG tablet Commonly known as: LASIX Take 0.5 tablets (20 mg total) by mouth 2 (two) times daily. <PLEASE MAKE APPOINTMENT FOR REFILLS>   metoprolol tartrate 25 MG tablet Commonly known as: LOPRESSOR Take 1 tablet (25 mg total) by mouth 2 (two) times daily.   ProAir HFA 108 (90 Base) MCG/ACT inhaler Generic drug: albuterol TAKE 2 PUFFS BY MOUTH EVERY 6 HOURS AS NEEDED FOR WHEEZE OR SHORTNESS OF BREATH What changed: See the new instructions.   albuterol (2.5 MG/3ML) 0.083% nebulizer solution Commonly known as: PROVENTIL Take 2.5 mg by nebulization every 6 (six) hours as needed for wheezing or shortness of breath. What changed: Another medication with the same name was changed. Make sure you understand how and when to take each.   rosuvastatin 40 MG tablet Commonly known as: CRESTOR Take 40 mg by mouth daily.   Trelegy Ellipta 200-62.5-25 MCG/ACT Aepb Generic drug: Fluticasone-Umeclidin-Vilant Inhale 1 puff  into the lungs daily.   VITAMIN B 12 PO Take 1 tablet by mouth daily.   Vitamin D 50 MCG (2000 UT) Caps Take 2,000 Units by mouth daily.        Follow-up Information     Leeroy Cha, MD. Schedule an appointment as soon as possible for a visit in 1 week(s).   Specialty: Internal Medicine Contact information: [address] [place] [address] [phone]                No Known Allergies   Other Procedures/Studies: DG CHEST PORT 1 VIEW  Result Date: 01/05/2022 CLINICAL DATA:  Shortness of breath. EXAM: PORTABLE CHEST 1 VIEW COMPARISON:  Radiographs 01/04/2022 and 01/02/2022.  CT 01/21/2020. FINDINGS: 0505 hours. Two views obtained. The heart size and  mediastinal contours are stable with aortic atherosclerosis and mild cardiac enlargement. Slight worsening of bibasilar airspace opacities and probable small pleural effusions. No edema or pneumothorax. The bones appear unchanged. Telemetry leads overlie the chest. IMPRESSION: Slight worsening of bibasilar airspace opacities and probable small pleural effusions. Consider aspiration. Electronically Signed   By: Richardean Sale M.D.   On: 01/05/2022 08:19   DG CHEST PORT 1 VIEW  Result Date: 01/04/2022 CLINICAL DATA:  88 year old female with shortness of breath.  COPD. EXAM: PORTABLE CHEST 1 VIEW COMPARISON:  Portable chest 01/02/2022 and earlier. FINDINGS: Portable AP upright view at 0555 hours. Stable pulmonary hyperinflation with emphysema demonstrated by CT in 2021. Stable cardiac size and mediastinal contours. Ongoing patchy bibasilar lung opacity, mildly progressed on the right. Small bilateral pleural effusions are possible. No air bronchograms, pneumothorax or pulmonary edema. Stable visualized osseous structures. Paucity of bowel gas in the visible upper abdomen. IMPRESSION: Chronic Emphysema (ICD10-J43.9) with ongoing bibasilar opacity, mildly increased on the right. Consider acute infectious exacerbation, and small bilateral pleural effusions are possible. Electronically Signed   By: Genevie Ann M.D.   On: 01/04/2022 06:24   DG Chest Port 1 View  Result Date: 01/02/2022 CLINICAL DATA:  Questionable sepsis EXAM: PORTABLE CHEST 1 VIEW COMPARISON:  Chest x-ray 02/15/2021 FINDINGS: There are patchy airspace opacities in both lung bases. There is a small left pleural effusion. Cardiomediastinal silhouette is within normal limits. No acute fractures are seen. IMPRESSION: 1. Patchy airspace opacities in both lung bases, concerning for infection. 2. Small left pleural effusion. Electronically Signed   By: Ronney Asters M.D.   On: 01/02/2022 19:51     TODAY-DAY OF DISCHARGE:  Subjective:   Courtney Cline today has no headache,no chest abdominal pain,no new weakness tingling or numbness, feels much better wants to go home today.   Objective:   Blood pressure (!) 148/66, pulse 76, temperature 97.7 F (36.5 C), temperature source Oral, resp. rate 18, height 5' (1.524 m), weight 70.3 kg, SpO2 98 %.  Intake/Output Summary (Last 24 hours) at 01/05/2022 0858 Last data filed at 01/05/2022 0600 Gross per 24 hour  Intake 697.76 ml  Output 700 ml  Net -2.24 ml   Filed Weights   01/02/22 1900  Weight: 70.3 kg    Exam: Awake Alert, Oriented *3, No new F.N deficits, Normal affect [place].AT,PERRAL Supple Neck,No JVD, No cervical lymphadenopathy appriciated.  Symmetrical Chest wall movement, Good air movement bilaterally, CTAB RRR,No Gallops,Rubs or new Murmurs, No Parasternal Heave +ve B.Sounds, Abd Soft, Non tender, No organomegaly appriciated, No rebound -guarding or rigidity. No Cyanosis, Clubbing or edema, No new Rash or bruise   PERTINENT RADIOLOGIC STUDIES: DG CHEST PORT  1 VIEW  Result Date: 01/05/2022 CLINICAL DATA:  Shortness of breath. EXAM: PORTABLE CHEST 1 VIEW COMPARISON:  Radiographs 01/04/2022 and 01/02/2022.  CT 01/21/2020. FINDINGS: 0505 hours. Two views obtained. The heart size and mediastinal contours are stable with aortic atherosclerosis and mild cardiac enlargement. Slight worsening of bibasilar airspace opacities and probable small pleural effusions. No edema or pneumothorax. The bones appear unchanged. Telemetry leads overlie the chest. IMPRESSION: Slight worsening of bibasilar airspace opacities and probable small pleural effusions. Consider aspiration. Electronically Signed   By: Richardean Sale M.D.   On: 01/05/2022 08:19   DG CHEST PORT 1 VIEW  Result Date: 01/04/2022 CLINICAL DATA:  88 year old female with shortness of breath.  COPD. EXAM: PORTABLE CHEST 1 VIEW COMPARISON:  Portable chest 01/02/2022 and earlier. FINDINGS: Portable AP upright view at 0555 hours.  Stable pulmonary hyperinflation with emphysema demonstrated by CT in 2021. Stable cardiac size and mediastinal contours. Ongoing patchy bibasilar lung opacity, mildly progressed on the right. Small bilateral pleural effusions are possible. No air bronchograms, pneumothorax or pulmonary edema. Stable visualized osseous structures. Paucity of bowel gas in the visible upper abdomen. IMPRESSION: Chronic Emphysema (ICD10-J43.9) with ongoing bibasilar opacity, mildly increased on the right. Consider acute infectious exacerbation, and small bilateral pleural effusions are possible. Electronically Signed   By: Genevie Ann M.D.   On: 01/04/2022 06:24     PERTINENT LAB RESULTS: CBC: Recent Labs    01/04/22 0321 01/05/22 0630  WBC 11.0* 6.2  HGB 8.0* 8.2*  HCT 26.4* 26.1*  PLT 208 225   CMET CMP     Component Value Date/Time   NA 142 01/05/2022 0630   NA 142 11/07/2016 0932   K 4.3 01/05/2022 0630   K 4.3 11/07/2016 0932   CL 104 01/05/2022 0630   CO2 25 01/05/2022 0630   CO2 30 (H) 11/07/2016 0932   GLUCOSE 91 01/05/2022 0630   GLUCOSE 118 11/07/2016 0932   BUN 34 (H) 01/05/2022 0630   BUN 28.2 (H) 11/07/2016 0932   CREATININE 1.28 (H) 01/05/2022 0630   CREATININE 1.39 (H) 12/24/2018 1116   CREATININE 1.3 (H) 11/07/2016 0932   CALCIUM 8.7 (L) 01/05/2022 0630   CALCIUM 9.7 11/07/2016 0932   PROT 5.6 (L) 01/05/2022 0630   PROT 7.3 11/07/2016 0932   ALBUMIN 2.4 (L) 01/05/2022 0630   ALBUMIN 3.6 11/07/2016 0932   AST 32 01/05/2022 0630   AST 9 (L) 12/24/2018 1116   AST 12 11/07/2016 0932   ALT 23 01/05/2022 0630   ALT 6 12/24/2018 1116   ALT <6 11/07/2016 0932   ALKPHOS 51 01/05/2022 0630   ALKPHOS 74 11/07/2016 0932   BILITOT 0.4 01/05/2022 0630   BILITOT 0.3 12/24/2018 1116   BILITOT 0.31 11/07/2016 0932   GFRNONAA 41 (L) 01/05/2022 0630   GFRNONAA 35 (L) 12/24/2018 1116   GFRAA 41 (L) 12/24/2018 1116    GFR Estimated Creatinine Clearance: 27.6 mL/min (A) (by C-G formula based  on SCr of 1.28 mg/dL (H)). No results for input(s): "LIPASE", "AMYLASE" in the last 72 hours. No results for input(s): "CKTOTAL", "CKMB", "CKMBINDEX", "TROPONINI" in the last 72 hours. Invalid input(s): "POCBNP" No results for input(s): "DDIMER" in the last 72 hours. No results for input(s): "HGBA1C" in the last 72 hours. No results for input(s): "CHOL", "HDL", "LDLCALC", "TRIG", "CHOLHDL", "LDLDIRECT" in the last 72 hours. No results for input(s): "TSH", "T4TOTAL", "T3FREE", "THYROIDAB" in the last 72 hours.  Invalid input(s): "FREET3" Recent Labs    01/04/22 0935  VITAMINB12 411  FOLATE 6.1  FERRITIN 287  TIBC 209*  IRON 48  RETICCTPCT 2.2   Coags: Recent Labs    01/02/22 1917  INR 1.3*   Microbiology: Recent Results (from the past 240 hour(s))  Resp Panel by RT-PCR (Flu A&B, Covid) Anterior Nasal Swab     Status: None   Collection Time: 01/02/22  6:57 PM   Specimen: Anterior Nasal Swab  Result Value Ref Range Status   SARS Coronavirus 2 by RT PCR NEGATIVE NEGATIVE Final    Comment: (NOTE) SARS-CoV-2 target nucleic acids are NOT DETECTED.  The SARS-CoV-2 RNA is generally detectable in upper respiratory specimens during the acute phase of infection. The lowest concentration of SARS-CoV-2 viral copies this assay can detect is 138 copies/mL. A negative result does not preclude SARS-Cov-2 infection and should not be used as the sole basis for treatment or other patient management decisions. A negative result may occur with  improper specimen collection/handling, submission of specimen other than nasopharyngeal swab, presence of viral mutation(s) within the areas targeted by this assay, and inadequate number of viral copies(<138 copies/mL). A negative result must be combined with clinical observations, patient history, and epidemiological information. The expected result is Negative.  Fact Sheet for Patients:  EntrepreneurPulse.com.au  Fact Sheet for  Healthcare Providers:  IncredibleEmployment.be  This test is no t yet approved or cleared by the [place] FDA and  has been authorized for detection and/or diagnosis of SARS-CoV-2 by FDA under an Emergency Use Authorization (EUA). This EUA will remain  in effect (meaning this test can be used) for the duration of the COVID-19 declaration under Section 564(b)(1) of the Act, 21 U.S.C.section 360bbb-3(b)(1), unless the authorization is terminated  or revoked sooner.       Influenza A by PCR NEGATIVE NEGATIVE Final   Influenza B by PCR NEGATIVE NEGATIVE Final    Comment: (NOTE) The Xpert Xpress SARS-CoV-2/FLU/RSV plus assay is intended as an aid in the diagnosis of influenza from Nasopharyngeal swab specimens and should not be used as a sole basis for treatment. Nasal washings and aspirates are unacceptable for Xpert Xpress SARS-CoV-2/FLU/RSV testing.  Fact Sheet for Patients: EntrepreneurPulse.com.au  Fact Sheet for Healthcare Providers: IncredibleEmployment.be  This test is not yet approved or cleared by the [place] FDA and has been authorized for detection and/or diagnosis of SARS-CoV-2 by FDA under an Emergency Use Authorization (EUA). This EUA will remain in effect (meaning this test can be used) for the duration of the COVID-19 declaration under Section 564(b)(1) of the Act, 21 U.S.C. section 360bbb-3(b)(1), unless the authorization is terminated or revoked.  Performed at [facility], [place] [address], [place], [place] [address]   Blood Culture (routine x 2)     Status: None (Preliminary result)   Collection Time: 01/02/22  7:07 PM   Specimen: BLOOD  Result Value Ref Range Status   Specimen Description BLOOD RIGHT ANTECUBITAL  Final   Special Requests   Final    BOTTLES DRAWN AEROBIC AND ANAEROBIC Blood Culture results may not be optimal due to an inadequate volume of blood received in culture bottles    Culture   Final    NO GROWTH 3 DAYS Performed at [facility], [place] [address], [place],  [address]    Report Status PENDING  Incomplete  Blood Culture (routine x 2)     Status: None (Preliminary result)   Collection Time: 01/02/22  7:12 PM   Specimen: BLOOD LEFT HAND  Result Value Ref Range Status   Specimen Description BLOOD LEFT HAND  Final   Special Requests   Final    BOTTLES DRAWN AEROBIC AND ANAEROBIC Blood Culture results may not be optimal due to an excessive volume of blood received in culture bottles   Culture   Final    NO GROWTH 3 DAYS Performed at [facility] [address], [place], [place] [address]    Report Status PENDING  Incomplete    FURTHER DISCHARGE INSTRUCTIONS:  Get Medicines reviewed and adjusted: Please take all your medications with you for your next visit with your Primary MD  Laboratory/radiological data: Please request your Primary MD to go over all hospital tests and procedure/radiological results at the follow up, please ask your Primary MD to get all Hospital records sent to his/her office.  In some cases, they will be blood work, cultures and biopsy results pending at the time of your discharge. Please request that your primary care M.D. goes through all the records of your hospital data and follows up on these results.  Also Note the following: If you experience worsening of your admission symptoms, develop shortness of breath, life threatening emergency, suicidal or homicidal thoughts you must seek medical attention immediately by calling 911 or calling your MD immediately  if symptoms less severe.  You must read complete instructions/literature along with all the possible adverse reactions/side effects for all the Medicines you take and that have been prescribed to you. Take any new Medicines after you have completely understood and accpet all the possible adverse reactions/side effects.   Do not drive when taking Pain  medications or sleeping medications (Benzodaizepines)  Do not take more than prescribed Pain, Sleep and Anxiety Medications. It is not advisable to combine anxiety,sleep and pain medications without talking with your primary care practitioner  Special Instructions: If you have smoked or chewed Tobacco  in the last 2 yrs please stop smoking, stop any regular Alcohol  and or any Recreational drug use.  Wear Seat belts while driving.  Please note: You were cared for by a hospitalist during your hospital stay. Once you are discharged, your primary care physician will handle any further medical issues. Please note that NO REFILLS for any discharge medications will be authorized once you are discharged, as it is imperative that you return to your primary care physician (or establish a relationship with a primary care physician if you do not have one) for your post hospital discharge needs so that they can reassess your need for medications and monitor your lab values.  Total Time spent coordinating discharge including counseling, education and face to face time equals greater than 30 minutes.  SignedOren Binet 01/05/2022 8:58 AM

## 2022-01-07 LAB — CULTURE, BLOOD (ROUTINE X 2)
Culture: NO GROWTH
Culture: NO GROWTH

## 2022-01-11 NOTE — Progress Notes (Unsigned)
[place]   [phone] [phone]   Clinic Follow up Note   Patient Care Team: Leeroy Cha, MD as PCP - General (Internal Medicine) Lorretta Harp, MD as PCP - Cardiology (Cardiology) Juanito Doom, MD as Consulting Physician (Pulmonary Disease) Truitt Merle, MD as Consulting Physician (Hematology) Erroll Luna, MD as Consulting Physician (General Surgery)  Date of Service:  01/12/2022  CHIEF COMPLAINT: f/u of anemia, h/o breast cancer   CURRENT THERAPY: Retacrit injection, starting 07/01/21  ASSESSMENT:  Courtney Cline is a 88 y.o. female with    1. Normocytic anemia secondary to iron deficiency and chronic kidney disease -long standing history of low hgb secondary to CKD -she is on Eliquis for A.fib -endorses taking oral iron  -prior stool occult test was negative. -she recently presented to her PCP with nosebleeds. She was seen by Dr. Fredric Dine in ENT on 07/05/21. -we started her on retacrit injections on 07/01/21. She also receives IV Venofer for ferritin <100. She tolerates these well with no side effects.  -lab reviewed, hgb is up to 11 today. Given this, we will hold retacrit injection today, proceed with next dose in 2 weeks, then change to every 4 weeks given good response.   2. CKD, stage IV -creatinine has been elevated since at least 2018 -most recent cr improved to 1.43 on 06/24/21.   3. A.fib, COPD -she is oxygen-dependent since ~2015 -she is on Eliquis, but she is reluctant to take it due to intermittent nose bleeding  -followed by Drs. Loanne Drilling and Gwenlyn Found   4. H/o Stage IA Right Breast Cancer, ER+/PR+/Her2- -diagnosed in 10/2013, s/p right lumpectomy. Completed 5 years of anastrozole in 11/2018. -most recent mammogram 08/16/21 was negative.     PLAN: -Lab reviewed  -Retacrit injection today and continue every 4 weeks  -Lab and retacrit injection every 4 weeks x6 F/u in 6 months    SUMMARY OF  ONCOLOGIC HISTORY: Oncology History Overview Note  Breast cancer of upper-inner quadrant of right female breast   Staging form: Breast, AJCC 7th Edition     Clinical: Stage Unknown (T1c, NX, cM0) - Signed by Glean Salvo, MD on 11/19/2013       Staging comments: ER 100%+, PR 100%+, Ki-67 7%      Breast cancer of upper-inner quadrant of right female breast ([place])  12/06/2012 Imaging   Bone Density performed at [facility] physicians T score Lumbar spine -0.1 (-0.7 in 2012), Right neck femur -1.4 (-1.2 before), Left neck femur -1.4 (-1.5 before) and Interpretation is OSTEOPENIA by WHO criteria.    10/11/2013 Mammogram   Abnormal Screening mammogram showing Right Breast mass.   10/23/2013 Breast US   Diagnostic mammogram and US showed an irregular hypoechoic mass at 9'0 clock position right breast 8 cm from nipple. [place] measurement 1.2x0.7x1.1 cm, no axillary adenopathy.   10/23/2013 Initial Biopsy   US guided biopsy  done with clip placement.   10/23/2013 Pathology Results   Invasive ductal carcinoma (IDC), DCIS, invasive cancer grade 2, ER 100%+, PR 100%+, Ki-67% 7%, HER-2/NEU by CISH No amplification, ratio of her2:cep17 1.00, average her2 copy number per cell 1.95 (HER 2 Negative tumor). Molecular Classification LUMINAL A.   11/01/2013 Surgery   Initial surgical evaluation by Dr Marcello Moores Cornett: "Not good surgical candidate because of pulmonary status". Recommended anti-estrogen therapy.   11/18/2013 -  Anti-estrogen oral therapy   Anastrozole 1 mg once daily   12/20/2013 Surgery   right breast lumpectomy without SLN  biopsy, negative margins    12/20/2013 Pathologic Stage   pT1cNxMx, G2, LVI (-), tumor measures 1.2cm. Surgical margins are negative.     10/18/2016 Imaging   MM DIAG BREAST TOMO BILATERAL 10/18/16 IMPRESSION: No mammographic evidence of malignancy involving either breast. Expected post lumpectomy changes in the right breast.   11/08/2018 Mammogram   IMPRESSION: No evidence  of malignancy in either breast. Lumpectomy changes on the right. RECOMMENDATION: Diagnostic mammogram is suggested in 1 year. (Code:DM-B-01Y)      INTERVAL HISTORY:  Courtney Cline is here for a follow up of  nemia, h/o breast cancer  She was last seen by me on 10/05/2021 She presents to the clinic accompanied by her son. Pt states she was hospitalize for Pneumonia.Pt states she feel well, have a little cough, no fever.  All other systems were reviewed with the patient and are negative.  MEDICAL HISTORY:  Past Medical History:  Diagnosis Date   Allergic rhinitis    Arthritis    Breast calcification seen on mammogram    Right breast   Breast cancer (HCC)    COPD (chronic obstructive pulmonary disease) (HCC)    GERD (gastroesophageal reflux disease)    Hyperglycemia    Hyperlipidemia    Hypertension    Low back pain    On home oxygen therapy    all the time   Osteopenia    Peripheral edema    Shortness of breath    Subclavian artery stenosis, left ([place])    Vitamin D deficiency    Wears dentures    top   Wears glasses    reading    SURGICAL HISTORY: Past Surgical History:  Procedure Laterality Date   BREAST BIOPSY     BREAST LUMPECTOMY     right 2015   BREAST LUMPECTOMY WITH RADIOACTIVE SEED LOCALIZATION Right 12/20/2013   Procedure: RIGHT BREAST LUMPECTOMY WITH RADIOACTIVE SEED LOCALIZATION;  Surgeon: Erroll Luna, MD;  Location: [place];  Service: General;  Laterality: Right;   CAROTID DUPLEX  2014   CATARACT EXTRACTION     both eyes    I have reviewed the social history and family history with the patient and they are unchanged from previous note.  ALLERGIES:  has No Known Allergies.  MEDICATIONS:  Current Outpatient Medications  Medication Sig Dispense Refill   acetaminophen (TYLENOL) 325 MG tablet Take 650 mg by mouth every 6 (six) hours as needed for mild pain or headache.      albuterol (PROVENTIL) (2.5 MG/3ML) 0.083%  nebulizer solution Take 2.5 mg by nebulization every 6 (six) hours as needed for wheezing or shortness of breath.     apixaban (ELIQUIS) 2.5 MG TABS tablet Take 1 tablet (2.5 mg total) by mouth 2 (two) times daily. 60 tablet 0   Cholecalciferol (VITAMIN D) 2000 UNITS CAPS Take 2,000 Units by mouth daily.      Cyanocobalamin (VITAMIN B 12 PO) Take 1 tablet by mouth daily.     Fluticasone-Umeclidin-Vilant (TRELEGY ELLIPTA) 200-62.5-25 MCG/ACT AEPB Inhale 1 puff into the lungs daily. 60 each 5   furosemide (LASIX) 40 MG tablet Take 0.5 tablets (20 mg total) by mouth 2 (two) times daily. <PLEASE MAKE APPOINTMENT FOR REFILLS> 30 tablet 0   metoprolol tartrate (LOPRESSOR) 25 MG tablet Take 1 tablet (25 mg total) by mouth 2 (two) times daily. 60 tablet 0   PROAIR HFA 108 (90 Base) MCG/ACT inhaler TAKE 2 PUFFS BY MOUTH EVERY 6 HOURS AS NEEDED FOR WHEEZE  OR SHORTNESS OF BREATH (Patient taking differently: Inhale 2 puffs into the lungs every 6 (six) hours as needed for wheezing or shortness of breath.) 8.5 Inhaler 11   rosuvastatin (CRESTOR) 40 MG tablet Take 40 mg by mouth daily.     No current facility-administered medications for this visit.    PHYSICAL EXAMINATION: ECOG PERFORMANCE STATUS: 2 - Symptomatic, <50% confined to bed  Vitals:   01/12/22 1328  BP: (!) 162/59  Pulse: 68  Resp: 18  Temp: 97.7 F (36.5 C)  SpO2: 98%   Wt Readings from Last 3 Encounters:  01/02/22 155 lb (70.3 kg)  12/23/21 154 lb (69.9 kg)  10/05/21 161 lb 4.8 oz (73.2 kg)     GENERAL:alert, no distress and comfortable SKIN: skin color normal, no rashes or significant lesions EYES: normal, Conjunctiva are pink and non-injected, sclera clear  NEURO: alert & oriented x 3 with fluent speech  LABORATORY DATA:  I have reviewed the data as listed    Latest Ref Rng & Units 01/12/2022   12:57 PM 01/05/2022    6:30 AM 01/04/2022    3:21 AM  CBC  WBC 4.0 - 10.5 K/uL 7.6  6.2  11.0   Hemoglobin 12.0 - 15.0 g/dL 9.0   8.2  8.0   Hematocrit 36.0 - 46.0 % 29.0  26.1  26.4   Platelets 150 - 400 K/uL 303  225  208         Latest Ref Rng & Units 01/05/2022    6:30 AM 01/04/2022    3:21 AM 01/03/2022    4:21 AM  CMP  Glucose 70 - 99 mg/dL 91  103  161   BUN 8 - 23 mg/dL 34  34  25   Creatinine 0.44 - 1.00 mg/dL 1.28  1.36  1.52   Sodium 135 - 145 mmol/L 142  141  138   Potassium 3.5 - 5.1 mmol/L 4.3  4.6  4.2   Chloride 98 - 111 mmol/L 104  105  103   CO2 22 - 32 mmol/L _0 Calcium 8.9 - 10.3 mg/dL 8.7  8.8  8.9   Total Protein 6.5 - 8.1 g/dL 5.6  5.7    Total Bilirubin 0.3 - 1.2 mg/dL 0.4  0.3    Alkaline Phos 38 - 126 U/L 51  51    AST 15 - 41 U/L 32  19    ALT 0 - 44 U/L 23  13        RADIOGRAPHIC STUDIES: I have personally reviewed the radiological images as listed and agreed with the findings in the report. No results found.    No orders of the defined types were placed in this encounter.  All questions were answered. The patient knows to call the clinic with any problems, questions or concerns. No barriers to learning was detected. The total time spent in the appointment was 20 minutes.     Truitt Merle, MD 01/12/2022   Felicity Coyer, CMA, am acting as scribe for Truitt Merle, MD.   I have reviewed the above documentation for accuracy and completeness, and I agree with the above.

## 2022-01-12 ENCOUNTER — Other Ambulatory Visit: Payer: Self-pay

## 2022-01-12 ENCOUNTER — Inpatient Hospital Stay: Payer: Medicare HMO

## 2022-01-12 ENCOUNTER — Inpatient Hospital Stay: Payer: Medicare HMO | Attending: Hematology | Admitting: Hematology

## 2022-01-12 ENCOUNTER — Encounter: Payer: Self-pay | Admitting: Hematology

## 2022-01-12 VITALS — BP 154/61 | HR 68 | Temp 98.2°F | Resp 18

## 2022-01-12 VITALS — BP 162/59 | HR 68 | Temp 97.7°F | Resp 18

## 2022-01-12 DIAGNOSIS — Z9981 Dependence on supplemental oxygen: Secondary | ICD-10-CM | POA: Diagnosis not present

## 2022-01-12 DIAGNOSIS — C50211 Malignant neoplasm of upper-inner quadrant of right female breast: Secondary | ICD-10-CM | POA: Diagnosis not present

## 2022-01-12 DIAGNOSIS — N184 Chronic kidney disease, stage 4 (severe): Secondary | ICD-10-CM | POA: Diagnosis not present

## 2022-01-12 DIAGNOSIS — Z7901 Long term (current) use of anticoagulants: Secondary | ICD-10-CM | POA: Diagnosis not present

## 2022-01-12 DIAGNOSIS — I4891 Unspecified atrial fibrillation: Secondary | ICD-10-CM | POA: Insufficient documentation

## 2022-01-12 DIAGNOSIS — Z853 Personal history of malignant neoplasm of breast: Secondary | ICD-10-CM | POA: Insufficient documentation

## 2022-01-12 DIAGNOSIS — D631 Anemia in chronic kidney disease: Secondary | ICD-10-CM | POA: Insufficient documentation

## 2022-01-12 DIAGNOSIS — D500 Iron deficiency anemia secondary to blood loss (chronic): Secondary | ICD-10-CM

## 2022-01-12 DIAGNOSIS — Z170 Estrogen receptor positive status [ER+]: Secondary | ICD-10-CM | POA: Diagnosis not present

## 2022-01-12 DIAGNOSIS — J449 Chronic obstructive pulmonary disease, unspecified: Secondary | ICD-10-CM | POA: Diagnosis not present

## 2022-01-12 DIAGNOSIS — D638 Anemia in other chronic diseases classified elsewhere: Secondary | ICD-10-CM

## 2022-01-12 LAB — CBC WITH DIFFERENTIAL/PLATELET
Abs Immature Granulocytes: 0.03 10*3/uL (ref 0.00–0.07)
Basophils Absolute: 0 10*3/uL (ref 0.0–0.1)
Basophils Relative: 0 %
Eosinophils Absolute: 0.2 10*3/uL (ref 0.0–0.5)
Eosinophils Relative: 2 %
HCT: 29 % — ABNORMAL LOW (ref 36.0–46.0)
Hemoglobin: 9 g/dL — ABNORMAL LOW (ref 12.0–15.0)
Immature Granulocytes: 0 %
Lymphocytes Relative: 13 %
Lymphs Abs: 1 10*3/uL (ref 0.7–4.0)
MCH: 30.2 pg (ref 26.0–34.0)
MCHC: 31 g/dL (ref 30.0–36.0)
MCV: 97.3 fL (ref 80.0–100.0)
Monocytes Absolute: 0.5 10*3/uL (ref 0.1–1.0)
Monocytes Relative: 6 %
Neutro Abs: 5.9 10*3/uL (ref 1.7–7.7)
Neutrophils Relative %: 79 %
Platelets: 303 10*3/uL (ref 150–400)
RBC: 2.98 MIL/uL — ABNORMAL LOW (ref 3.87–5.11)
RDW: 14.3 % (ref 11.5–15.5)
WBC: 7.6 10*3/uL (ref 4.0–10.5)
nRBC: 0 % (ref 0.0–0.2)

## 2022-01-12 LAB — FERRITIN: Ferritin: 227 ng/mL (ref 11–307)

## 2022-01-12 MED ORDER — EPOETIN ALFA-EPBX 10000 UNIT/ML IJ SOLN
10000.0000 [IU] | Freq: Once | INTRAMUSCULAR | Status: AC
  Administered 2022-01-12: 10000 [IU] via SUBCUTANEOUS
  Filled 2022-01-12: qty 1

## 2022-01-12 NOTE — Patient Instructions (Signed)

## 2022-01-13 ENCOUNTER — Telehealth: Payer: Self-pay | Admitting: Hematology

## 2022-01-13 NOTE — Telephone Encounter (Signed)
Called patient to notify of upcoming appointment. Patient notified.  

## 2022-01-17 DIAGNOSIS — N183 Chronic kidney disease, stage 3 unspecified: Secondary | ICD-10-CM | POA: Diagnosis not present

## 2022-01-17 DIAGNOSIS — J9611 Chronic respiratory failure with hypoxia: Secondary | ICD-10-CM | POA: Diagnosis not present

## 2022-01-17 DIAGNOSIS — I5032 Chronic diastolic (congestive) heart failure: Secondary | ICD-10-CM | POA: Diagnosis not present

## 2022-01-17 DIAGNOSIS — I480 Paroxysmal atrial fibrillation: Secondary | ICD-10-CM | POA: Diagnosis not present

## 2022-01-17 DIAGNOSIS — E876 Hypokalemia: Secondary | ICD-10-CM | POA: Diagnosis not present

## 2022-01-17 DIAGNOSIS — J189 Pneumonia, unspecified organism: Secondary | ICD-10-CM | POA: Diagnosis not present

## 2022-01-17 DIAGNOSIS — I10 Essential (primary) hypertension: Secondary | ICD-10-CM | POA: Diagnosis not present

## 2022-02-09 ENCOUNTER — Telehealth: Payer: Self-pay | Admitting: Hematology

## 2022-02-09 ENCOUNTER — Inpatient Hospital Stay: Payer: Medicare HMO

## 2022-02-09 NOTE — Telephone Encounter (Signed)
Patient's son called to r/s appointment due to illness.

## 2022-02-15 ENCOUNTER — Inpatient Hospital Stay: Payer: Medicare HMO | Attending: Hematology

## 2022-02-15 ENCOUNTER — Inpatient Hospital Stay: Payer: Medicare HMO

## 2022-02-15 ENCOUNTER — Other Ambulatory Visit: Payer: Self-pay

## 2022-02-15 VITALS — BP 145/67 | HR 63 | Temp 98.6°F | Resp 18

## 2022-02-15 DIAGNOSIS — D638 Anemia in other chronic diseases classified elsewhere: Secondary | ICD-10-CM

## 2022-02-15 DIAGNOSIS — N184 Chronic kidney disease, stage 4 (severe): Secondary | ICD-10-CM | POA: Insufficient documentation

## 2022-02-15 DIAGNOSIS — D500 Iron deficiency anemia secondary to blood loss (chronic): Secondary | ICD-10-CM

## 2022-02-15 DIAGNOSIS — D631 Anemia in chronic kidney disease: Secondary | ICD-10-CM | POA: Diagnosis not present

## 2022-02-15 LAB — CBC WITH DIFFERENTIAL/PLATELET
Abs Immature Granulocytes: 0.01 K/uL (ref 0.00–0.07)
Basophils Absolute: 0 K/uL (ref 0.0–0.1)
Basophils Relative: 0 %
Eosinophils Absolute: 0.1 K/uL (ref 0.0–0.5)
Eosinophils Relative: 3 %
HCT: 28.7 % — ABNORMAL LOW (ref 36.0–46.0)
Hemoglobin: 8.9 g/dL — ABNORMAL LOW (ref 12.0–15.0)
Immature Granulocytes: 0 %
Lymphocytes Relative: 15 %
Lymphs Abs: 0.8 K/uL (ref 0.7–4.0)
MCH: 30.2 pg (ref 26.0–34.0)
MCHC: 31 g/dL (ref 30.0–36.0)
MCV: 97.3 fL (ref 80.0–100.0)
Monocytes Absolute: 0.4 K/uL (ref 0.1–1.0)
Monocytes Relative: 7 %
Neutro Abs: 3.9 K/uL (ref 1.7–7.7)
Neutrophils Relative %: 75 %
Platelets: 203 K/uL (ref 150–400)
RBC: 2.95 MIL/uL — ABNORMAL LOW (ref 3.87–5.11)
RDW: 14 % (ref 11.5–15.5)
WBC: 5.3 K/uL (ref 4.0–10.5)
nRBC: 0 % (ref 0.0–0.2)

## 2022-02-15 LAB — FERRITIN: Ferritin: 81 ng/mL (ref 11–307)

## 2022-02-15 MED ORDER — EPOETIN ALFA-EPBX 10000 UNIT/ML IJ SOLN
10000.0000 [IU] | Freq: Once | INTRAMUSCULAR | Status: AC
  Administered 2022-02-15: 10000 [IU] via SUBCUTANEOUS
  Filled 2022-02-15: qty 1

## 2022-03-09 ENCOUNTER — Inpatient Hospital Stay: Payer: Medicare HMO

## 2022-03-16 ENCOUNTER — Telehealth: Payer: Self-pay | Admitting: Hematology

## 2022-03-16 NOTE — Telephone Encounter (Signed)
Patient's son called to r/s missed injection appointment from 1/24. Rescheduled appointment and adjusted following appointments. Patient will be notified.

## 2022-03-23 ENCOUNTER — Other Ambulatory Visit: Payer: Self-pay

## 2022-03-23 ENCOUNTER — Other Ambulatory Visit: Payer: Self-pay | Admitting: Hematology

## 2022-03-23 ENCOUNTER — Inpatient Hospital Stay: Payer: Medicare HMO | Attending: Hematology

## 2022-03-23 ENCOUNTER — Inpatient Hospital Stay: Payer: Medicare HMO

## 2022-03-23 VITALS — BP 162/77 | HR 71 | Temp 98.2°F | Resp 17

## 2022-03-23 DIAGNOSIS — N184 Chronic kidney disease, stage 4 (severe): Secondary | ICD-10-CM | POA: Insufficient documentation

## 2022-03-23 DIAGNOSIS — D638 Anemia in other chronic diseases classified elsewhere: Secondary | ICD-10-CM

## 2022-03-23 DIAGNOSIS — D631 Anemia in chronic kidney disease: Secondary | ICD-10-CM | POA: Insufficient documentation

## 2022-03-23 DIAGNOSIS — Z79899 Other long term (current) drug therapy: Secondary | ICD-10-CM | POA: Insufficient documentation

## 2022-03-23 DIAGNOSIS — D500 Iron deficiency anemia secondary to blood loss (chronic): Secondary | ICD-10-CM

## 2022-03-23 LAB — CBC WITH DIFFERENTIAL/PLATELET
Abs Immature Granulocytes: 0.03 10*3/uL (ref 0.00–0.07)
Basophils Absolute: 0 10*3/uL (ref 0.0–0.1)
Basophils Relative: 0 %
Eosinophils Absolute: 0.1 10*3/uL (ref 0.0–0.5)
Eosinophils Relative: 2 %
HCT: 28.9 % — ABNORMAL LOW (ref 36.0–46.0)
Hemoglobin: 9.1 g/dL — ABNORMAL LOW (ref 12.0–15.0)
Immature Granulocytes: 1 %
Lymphocytes Relative: 22 %
Lymphs Abs: 1.3 10*3/uL (ref 0.7–4.0)
MCH: 29.5 pg (ref 26.0–34.0)
MCHC: 31.5 g/dL (ref 30.0–36.0)
MCV: 93.8 fL (ref 80.0–100.0)
Monocytes Absolute: 0.5 10*3/uL (ref 0.1–1.0)
Monocytes Relative: 9 %
Neutro Abs: 4.1 10*3/uL (ref 1.7–7.7)
Neutrophils Relative %: 66 %
Platelets: 172 10*3/uL (ref 150–400)
RBC: 3.08 MIL/uL — ABNORMAL LOW (ref 3.87–5.11)
RDW: 14 % (ref 11.5–15.5)
WBC: 6.1 10*3/uL (ref 4.0–10.5)
nRBC: 0 % (ref 0.0–0.2)

## 2022-03-23 LAB — FERRITIN: Ferritin: 49 ng/mL (ref 11–307)

## 2022-03-23 MED ORDER — EPOETIN ALFA-EPBX 10000 UNIT/ML IJ SOLN
10000.0000 [IU] | Freq: Once | INTRAMUSCULAR | Status: AC
  Administered 2022-03-23: 10000 [IU] via SUBCUTANEOUS
  Filled 2022-03-23: qty 1

## 2022-03-23 NOTE — Patient Instructions (Signed)

## 2022-03-26 ENCOUNTER — Other Ambulatory Visit: Payer: Self-pay

## 2022-03-26 ENCOUNTER — Encounter (HOSPITAL_COMMUNITY): Payer: Self-pay | Admitting: Emergency Medicine

## 2022-03-26 ENCOUNTER — Ambulatory Visit (HOSPITAL_COMMUNITY)
Admission: EM | Admit: 2022-03-26 | Discharge: 2022-03-26 | Disposition: A | Discharge status: Home / Self Care | Payer: Medicare HMO | Attending: Family Medicine | Admitting: Family Medicine

## 2022-03-26 DIAGNOSIS — Z9229 Personal history of other drug therapy: Secondary | ICD-10-CM

## 2022-03-26 DIAGNOSIS — R040 Epistaxis: Secondary | ICD-10-CM

## 2022-03-26 MED ORDER — SILVER NITRATE-POT NITRATE 75-25 % EX MISC
CUTANEOUS | Status: AC
  Filled 2022-03-26: qty 10

## 2022-03-26 MED ORDER — OXYMETAZOLINE HCL 0.05 % NA SOLN
NASAL | Status: AC
  Filled 2022-03-26: qty 30

## 2022-03-26 MED ORDER — OXYMETAZOLINE HCL 0.05 % NA SOLN
1.0000 | Freq: Once | NASAL | Status: AC
  Administered 2022-03-26: 1 via NASAL

## 2022-03-26 NOTE — ED Notes (Signed)
Patient is being discharged from the Urgent Care and sent to the Emergency Department via POV . Per Dr Mannie Stabile, patient is in need of higher level of care due to Epistaxis not stopped with available interventions at ucc. Patient is aware and verbalizes understanding of plan of care.  Vitals:   03/26/22 1225  BP: (!) 145/77  Pulse: 68  Resp: 20  Temp: 98.6 F (37 C)  SpO2: 98%

## 2022-03-26 NOTE — ED Triage Notes (Signed)
Patient reports nosebleed started at 6 am.  Patient reports blood from both sides of nose, but mainly left side.  Currently seeping slightly.    Patient reports history of this .  Patient is on o2 constantly.

## 2022-03-26 NOTE — ED Provider Notes (Signed)
[facility]   [id number] 03/26/22 Arrival Time: [id number]  ASSESSMENT & PLAN:  1. Left-sided epistaxis   2. Hx of long term use of blood thinners   Eliquis  Meds ordered this encounter  Medications   oxymetazoline (AFRIN) 0.05 % nasal spray 1 spray   Procedure: Silver Nitrate cauterization Date/Time: 03/26/2022 12:45 PM Performed by: Vanessa Kick, MD  Consent:    Consent obtained:  Verbal    Consent given by:  Patient    Risks, benefits, and alternatives were discussed: yes     Risks discussed:  Bleeding, infection and nasal injury    Alternatives discussed:  No treatment  Universal protocol:    Patient identity confirmed:  Verbally with patient  Anesthesia:    Anesthesia method:  None  Procedure details:    Treatment site:  L anterior    Treatment method:  Silver nitrate    Treatment complexity:  Limited    Treatment episode: initial   Post-procedure details:    Assessment:  Bleeding stopped    Procedure completion:  Tolerated   Observed for 10 min post-procedure. Bleeding has resumed. Second spray of AFRIN given. Unsuccessful in stopping the bleeding.  To ED via POV. Stable upon discharge.   Follow-up Information     Go to  [facility] Emergency Department at [facility].   Specialty: Emergency Medicine Contact information: [id number] [place] [phone]               Reviewed expectations re: course of current medical issues. Questions answered. Outlined signs and symptoms indicating need for more acute intervention. Patient verbalized understanding. After Visit Summary given.   SUBJECTIVE: History from: patient.  Courtney Cline is a 89 y.o. female who presents with L-sided epistaxis; first noted this morning; h/o similar that is quite often difficult to control. On Eliquis. Uses O2 via [place] regularly. No respiratory difficulties.  Social History   Tobacco Use   Smoking Status Former   Packs/day: 1.00   Years: 40.00   Total pack years: 40.00   Types: Cigarettes   Quit date: 02/14/1994   Years since quitting: 28.1  Smokeless Tobacco Never   OBJECTIVE:  Vitals:   03/26/22 1225  BP: (!) 145/77  Pulse: 68  Resp: 20  Temp: 98.6 F (37 C)  TempSrc: Oral  SpO2: 98%    General appearance: alert; no distress HEENT: L-sided epistasis; can see small pulsating capillary over L anterior inner naris Neck: supple without LAD; trachea midline; symmetrical air entry; no respiratory distress Skin: warm and dry Psychological: alert and cooperative; normal mood and affect  No Known Allergies  Past Medical History:  Diagnosis Date   Allergic rhinitis    Arthritis    Breast calcification seen on mammogram    Right breast   Breast cancer (HCC)    COPD (chronic obstructive pulmonary disease) (HCC)    GERD (gastroesophageal reflux disease)    Hyperglycemia    Hyperlipidemia    Hypertension    Low back pain    On home oxygen therapy    all the time   Osteopenia    Peripheral edema    Shortness of breath    Subclavian artery stenosis, left (HCC)    Vitamin D deficiency    Wears dentures    top   Wears glasses    reading   Family History  Problem Relation Age of Onset   Emphysema Mother    Emphysema Maternal  Grandfather    Social History   Socioeconomic History   Marital status: Married    Spouse name: Herbie [place]   Number of children: 2   Years of education: Not on file   Highest education level: Not on file  Occupational History   Occupation: retired    Comment: Chartered certified accountant  Tobacco Use   Smoking status: Former    Packs/day: 1.00    Years: 40.00    Total pack years: 40.00    Types: Cigarettes    Quit date: 02/14/1994    Years since quitting: 28.1   Smokeless tobacco: Never  Vaping Use   Vaping Use: Never used  Substance and Sexual Activity   Alcohol use: No   Drug use: No   Sexual activity: Not on file  Other Topics  Concern   Not on file  Social History Narrative   Not on file   Social Determinants of Health   Financial Resource Strain: Not on file  Food Insecurity: No Food Insecurity (01/03/2022)   Hunger Vital Sign    Worried About Running Out of Food in the Last Year: Never true    Ran Out of Food in the Last Year: Never true  Transportation Needs: No Transportation Needs (01/03/2022)   PRAPARE - Hydrologist (Medical): No    Lack of Transportation (Non-Medical): No  Physical Activity: Not on file  Stress: Not on file  Social Connections: Not on file  Intimate Partner Violence: Not At Risk (01/03/2022)   Humiliation, Afraid, Rape, and Kick questionnaire    Fear of Current or Ex-Partner: No    Emotionally Abused: No    Physically Abused: No    Sexually Abused: No             Vanessa Kick, MD 03/26/22 [phone]

## 2022-03-28 ENCOUNTER — Telehealth: Payer: Self-pay

## 2022-03-28 NOTE — Telephone Encounter (Addendum)
Called patient and relayed message below, patient voiced understanding and agreed to the infusions.   ----- Message from Truitt Merle, MD sent at 03/28/2022  8:00 AM EST ----- Please let pt know her iron is on low side, I recommend iv venofer 371m weekly X2, please schedule if she agrees, thanks  [employer]

## 2022-03-31 ENCOUNTER — Other Ambulatory Visit: Payer: Self-pay | Admitting: Cardiovascular Disease

## 2022-03-31 ENCOUNTER — Telehealth: Payer: Self-pay | Admitting: Cardiovascular Disease

## 2022-03-31 NOTE — Telephone Encounter (Signed)
*  STAT* If patient is at the pharmacy, call can be transferred to refill team.   1. Which medications need to be refilled? (please list name of each medication and dose if known) apixaban (ELIQUIS) 2.5 MG TABS tablet   2. Which pharmacy/location (including street and city if local pharmacy) is medication to be sent to? [id number]- [place], [place] - [address] AT [address]  3. Do they need a 30 day or 90 day supply? [address]

## 2022-03-31 NOTE — Telephone Encounter (Signed)
Pt c/o medication issue:  1. Name of Medication: apixaban (ELIQUIS) 2.5 MG TABS tablet   2. How are you currently taking this medication (dosage and times per day)? Take 1 tablet (2.5 mg total) by mouth 2 (two) times daily.   3. Are you having a reaction (difficulty breathing--STAT)? no  4. What is your medication issue? Office called to say the patient only been taken medication once daily instead of twice a day. And been having nose bleed once every 1-2 months. Would like for our office to reach out to regarding medication.  Please advise

## 2022-03-31 NOTE — Telephone Encounter (Signed)
Spoke to patient, patient ended up in ED for nose bleed few days ago. Patient has dry nose. She uses humidifier in the house and uses normal saline spray as per her ENT doctor's suggestion. Upon reviewing saline spray administration techniques found out she point the tip of the spray towards the inner septum of her nose which could damage inner septum and increases risk of bleeding. Educated patient on appropriate administration techniques of saline nasal spray. Also suggest to get saline spray with gentle stream pressure ( e.g. one for babies or children).   In addition, educated patient on importance of taking Eliquis as prescribed twice daily.

## 2022-04-04 MED ORDER — APIXABAN 2.5 MG PO TABS: 2.5000 mg | ORAL_TABLET | Freq: Two times a day (BID) | ORAL | 3 refills | Status: DC

## 2022-04-04 NOTE — Telephone Encounter (Signed)
Prescription refill request for Eliquis received. Indication: PAF Last office visit: 05/06/21  Adora Fridge MD Scr: 1.28 on 01/05/22 Age: 89 Weight: 71.1kg  02/19/21  Eliquis waas reduced to 2.10m twice daily in hospital due to CKD.  This dose has been continued since.  Refill approved.

## 2022-04-05 DIAGNOSIS — E785 Hyperlipidemia, unspecified: Secondary | ICD-10-CM | POA: Diagnosis not present

## 2022-04-05 DIAGNOSIS — K219 Gastro-esophageal reflux disease without esophagitis: Secondary | ICD-10-CM | POA: Diagnosis not present

## 2022-04-05 DIAGNOSIS — J449 Chronic obstructive pulmonary disease, unspecified: Secondary | ICD-10-CM | POA: Diagnosis not present

## 2022-04-05 DIAGNOSIS — N183 Chronic kidney disease, stage 3 unspecified: Secondary | ICD-10-CM | POA: Diagnosis not present

## 2022-04-05 DIAGNOSIS — I480 Paroxysmal atrial fibrillation: Secondary | ICD-10-CM | POA: Diagnosis not present

## 2022-04-05 DIAGNOSIS — I10 Essential (primary) hypertension: Secondary | ICD-10-CM | POA: Diagnosis not present

## 2022-04-05 DIAGNOSIS — I5032 Chronic diastolic (congestive) heart failure: Secondary | ICD-10-CM | POA: Diagnosis not present

## 2022-04-05 MED ORDER — APIXABAN 2.5 MG PO TABS: 2.5000 mg | ORAL_TABLET | Freq: Two times a day (BID) | ORAL | 3 refills | Status: DC

## 2022-04-05 NOTE — Telephone Encounter (Signed)
Courtney Cline from [facility] is calling stating they received the prescription for this medication, but they are unable to fill it due to it not being a respiratory medicine. She states this would need to be sent to the patient's local pharmacy. Please advise.

## 2022-04-05 NOTE — Telephone Encounter (Signed)
Terri at [facility] stated eliquis needs to be sent to pt pharmacy, not their facility. Verified pharmacy with patient.

## 2022-04-06 ENCOUNTER — Inpatient Hospital Stay: Payer: Medicare HMO

## 2022-04-06 ENCOUNTER — Other Ambulatory Visit: Payer: Self-pay | Admitting: Hematology

## 2022-04-06 ENCOUNTER — Other Ambulatory Visit: Payer: Self-pay

## 2022-04-06 VITALS — BP 157/44 | HR 57 | Temp 98.4°F | Resp 18

## 2022-04-06 DIAGNOSIS — N184 Chronic kidney disease, stage 4 (severe): Secondary | ICD-10-CM | POA: Diagnosis not present

## 2022-04-06 DIAGNOSIS — D638 Anemia in other chronic diseases classified elsewhere: Secondary | ICD-10-CM

## 2022-04-06 DIAGNOSIS — D631 Anemia in chronic kidney disease: Secondary | ICD-10-CM | POA: Diagnosis not present

## 2022-04-06 DIAGNOSIS — Z79899 Other long term (current) drug therapy: Secondary | ICD-10-CM | POA: Diagnosis not present

## 2022-04-06 MED ORDER — SODIUM CHLORIDE 0.9 % IV SOLN: Freq: Once | INTRAVENOUS | Status: AC

## 2022-04-06 MED ORDER — SODIUM CHLORIDE 0.9 % IV SOLN
300.0000 mg | Freq: Once | INTRAVENOUS | Status: AC
  Administered 2022-04-06: 300 mg via INTRAVENOUS
  Filled 2022-04-06: qty 300

## 2022-04-06 NOTE — Patient Instructions (Signed)

## 2022-04-12 ENCOUNTER — Ambulatory Visit: Payer: Medicare HMO | Attending: Cardiovascular Disease | Admitting: Cardiovascular Disease

## 2022-04-12 ENCOUNTER — Encounter: Payer: Self-pay | Admitting: Cardiovascular Disease

## 2022-04-12 VITALS — BP 106/60 | HR 68 | Ht 60.0 in | Wt 155.0 lb

## 2022-04-12 DIAGNOSIS — E782 Mixed hyperlipidemia: Secondary | ICD-10-CM

## 2022-04-12 DIAGNOSIS — R600 Localized edema: Secondary | ICD-10-CM

## 2022-04-12 DIAGNOSIS — I771 Stricture of artery: Secondary | ICD-10-CM

## 2022-04-12 DIAGNOSIS — I350 Nonrheumatic aortic (valve) stenosis: Secondary | ICD-10-CM

## 2022-04-12 DIAGNOSIS — I4819 Other persistent atrial fibrillation: Secondary | ICD-10-CM

## 2022-04-12 NOTE — Assessment & Plan Note (Signed)
History of bilateral lower extremity edema which is minimal today on exam and improved on oral diuretics.

## 2022-04-12 NOTE — Assessment & Plan Note (Signed)
History of hyperlipidemia on statin therapy with lipid profile performed 03/31/2021 revealing total cholesterol of 152,, LDL 71 and HDL 64.

## 2022-04-12 NOTE — Progress Notes (Signed)
04/12/2022 Berkley Rohlik [facility]   02/16/1933  [id number]  Primary Physician Leeroy Cha, MD Primary Cardiologist: Lorretta Harp MD [name], [place]  HPI:  Courtney Cline is a 89 y.o.  married Caucasian female mother of 2, grandmother 4 grandchildren  was initially referred to me by Dr. Antony Blackbird for peripheral vascular evaluation because of presumed left subclavian artery stenosis and/or steel. I last saw her in the office 05/06/2021. Her cardiac risk factor profile is notable for remote tobacco abuse having quit 22 years ago, treated hypertension, and hyperlipidemia. She has never had a heart attack or stroke. She was a symptomatically with regards to left upper extremity claudication or dizziness and therefore continue conservative treatment of her subclavian artery stenosis was recommended at that time. She has noticed increasing bilateral lower extremity edema over the last one month which has improved somewhat with the addition of an oral diuretic. She does admit to dietary indiscretion. She is on chronic home O2 for her COPD and has not noticed any increasing shortness of breath.   Since I saw her a year ago she remained stable.  She does have trace lower extreme edema on oral diuretics.  She is on chronic O2 for COPD.  She currently is in sinus rhythm today on Eliquis oral anticoagulation.  She denies chest pain.     Current Meds  Medication Sig   acetaminophen (TYLENOL) 325 MG tablet Take 650 mg by mouth every 6 (six) hours as needed for mild pain or headache.    albuterol (PROVENTIL) (2.5 MG/3ML) 0.083% nebulizer solution Take 2.5 mg by nebulization every 6 (six) hours as needed for wheezing or shortness of breath.   apixaban (ELIQUIS) 2.5 MG TABS tablet Take 1 tablet (2.5 mg total) by mouth 2 (two) times daily.   Cholecalciferol (VITAMIN D) 2000 UNITS CAPS Take 2,000 Units by mouth daily.    Cyanocobalamin (VITAMIN B 12 PO) Take 1 tablet by mouth  daily.   Fluticasone-Umeclidin-Vilant (TRELEGY ELLIPTA) 200-62.5-25 MCG/ACT AEPB Inhale 1 puff into the lungs daily.   furosemide (LASIX) 40 MG tablet Take 0.5 tablets (20 mg total) by mouth 2 (two) times daily. <PLEASE MAKE APPOINTMENT FOR REFILLS>   metoprolol tartrate (LOPRESSOR) 25 MG tablet Take 1 tablet (25 mg total) by mouth 2 (two) times daily.   PROAIR HFA 108 (90 Base) MCG/ACT inhaler TAKE 2 PUFFS BY MOUTH EVERY 6 HOURS AS NEEDED FOR WHEEZE OR SHORTNESS OF BREATH (Patient taking differently: Inhale 2 puffs into the lungs every 6 (six) hours as needed for wheezing or shortness of breath.)   rosuvastatin (CRESTOR) 40 MG tablet Take 40 mg by mouth daily.     No Known Allergies  Social History   Socioeconomic History   Marital status: Married    Spouse name: Herbie Baltimore   Number of children: 2   Years of education: Not on file   Highest education level: Not on file  Occupational History   Occupation: retired    Comment: Chartered certified accountant  Tobacco Use   Smoking status: Former    Packs/day: 1.00    Years: 40.00    Total pack years: 40.00    Types: Cigarettes    Quit date: 02/14/1994    Years since quitting: 28.1   Smokeless tobacco: Never  Vaping Use   Vaping Use: Never used  Substance and Sexual Activity   Alcohol use: No   Drug use: No   Sexual activity: Not on file  Other Topics  Concern   Not on file  Social History Narrative   Not on file   Social Determinants of Health   Financial Resource Strain: Not on file  Food Insecurity: No Food Insecurity (01/03/2022)   Hunger Vital Sign    Worried About Running Out of Food in the Last Year: Never true    Ran Out of Food in the Last Year: Never true  Transportation Needs: No Transportation Needs (01/03/2022)   PRAPARE - Hydrologist (Medical): No    Lack of Transportation (Non-Medical): No  Physical Activity: Not on file  Stress: Not on file  Social Connections: Not on file  Intimate Partner  Violence: Not At Risk (01/03/2022)   Humiliation, Afraid, Rape, and Kick questionnaire    Fear of Current or Ex-Partner: No    Emotionally Abused: No    Physically Abused: No    Sexually Abused: No     Review of Systems: General: negative for chills, fever, night sweats or weight changes.  Cardiovascular: negative for chest pain, dyspnea on exertion, edema, orthopnea, palpitations, paroxysmal nocturnal dyspnea or shortness of breath Dermatological: negative for rash Respiratory: negative for cough or wheezing Urologic: negative for hematuria Abdominal: negative for nausea, vomiting, diarrhea, bright red blood per rectum, melena, or hematemesis Neurologic: negative for visual changes, syncope, or dizziness All other systems reviewed and are otherwise negative except as noted above.    Blood pressure 106/60, pulse 68, height 5' (1.524 m), weight 155 lb (70.3 kg), SpO2 97 %.  General appearance: alert and no distress Neck: no adenopathy, no carotid bruit, no JVD, supple, symmetrical, trachea midline, and thyroid not enlarged, symmetric, no tenderness/mass/nodules Lungs: clear to auscultation bilaterally Heart: regular rate and rhythm, S1, S2 normal, no murmur, click, rub or gallop Extremities: extremities normal, atraumatic, no cyanosis or edema Pulses: 2+ and symmetric Skin: Skin color, texture, turgor normal. No rashes or lesions Neurologic: Grossly normal  EKG sinus rhythm at 68 without ST or T wave changes.  Personally reviewed this EKG.  ASSESSMENT AND PLAN:   Subclavian artery stenosis, left (HCC) History of left subclavian artery stenosis which she is asymptomatic from.  Bilateral lower extremity edema History of bilateral lower extremity edema which is minimal today on exam and improved on oral diuretics.  Hyperlipidemia History of hyperlipidemia on statin therapy with lipid profile performed 03/31/2021 revealing total cholesterol of 152,, LDL 71 and HDL 64.  Persistent  atrial fibrillation (HCC) History of paroxysmal atrial fibrillation maintaining sinus rhythm on Eliquis oral anticoagulation.  Aortic stenosis History of aortic stenosis by 2D echo most recently checked 02/16/2021.  At this point, I do not feel compelled to continue to follow this noninvasively since she is not a candidate for intervention.     Lorretta Harp MD FACP,FACC,FAHA, [facility] 04/12/2022 3:44 PM

## 2022-04-12 NOTE — Assessment & Plan Note (Signed)
History of paroxysmal atrial fibrillation maintaining sinus rhythm on Eliquis oral anticoagulation.

## 2022-04-12 NOTE — Assessment & Plan Note (Signed)
History of left subclavian artery stenosis which she is asymptomatic from.

## 2022-04-12 NOTE — Assessment & Plan Note (Signed)
History of aortic stenosis by 2D echo most recently checked 02/16/2021.  At this point, I do not feel compelled to continue to follow this noninvasively since she is not a candidate for intervention.

## 2022-04-12 NOTE — Patient Instructions (Signed)

## 2022-04-13 ENCOUNTER — Other Ambulatory Visit: Payer: Self-pay

## 2022-04-13 ENCOUNTER — Inpatient Hospital Stay: Payer: Medicare HMO

## 2022-04-13 VITALS — BP 157/49 | HR 61 | Temp 98.3°F | Resp 18

## 2022-04-13 DIAGNOSIS — D631 Anemia in chronic kidney disease: Secondary | ICD-10-CM | POA: Diagnosis not present

## 2022-04-13 DIAGNOSIS — N184 Chronic kidney disease, stage 4 (severe): Secondary | ICD-10-CM | POA: Diagnosis not present

## 2022-04-13 DIAGNOSIS — D638 Anemia in other chronic diseases classified elsewhere: Secondary | ICD-10-CM

## 2022-04-13 DIAGNOSIS — Z79899 Other long term (current) drug therapy: Secondary | ICD-10-CM | POA: Diagnosis not present

## 2022-04-13 MED ORDER — SODIUM CHLORIDE 0.9 % IV SOLN: Freq: Once | INTRAVENOUS | Status: AC

## 2022-04-13 MED ORDER — SODIUM CHLORIDE 0.9 % IV SOLN
300.0000 mg | Freq: Once | INTRAVENOUS | Status: AC
  Administered 2022-04-13: 300 mg via INTRAVENOUS
  Filled 2022-04-13: qty 300

## 2022-04-13 NOTE — Progress Notes (Signed)
Pt declined to be observed for 30 minutes post Venofer infusion. Pt tolerated trtmt well w/out incident. VSS at discharge.  W/C assist to lobby.

## 2022-04-13 NOTE — Patient Instructions (Signed)

## 2022-04-20 ENCOUNTER — Inpatient Hospital Stay: Payer: Medicare HMO | Attending: Hematology

## 2022-04-20 ENCOUNTER — Other Ambulatory Visit: Payer: Self-pay

## 2022-04-20 ENCOUNTER — Inpatient Hospital Stay: Payer: Medicare HMO

## 2022-04-20 ENCOUNTER — Other Ambulatory Visit: Payer: Self-pay | Admitting: Hematology

## 2022-04-20 VITALS — BP 172/43 | HR 62 | Temp 98.7°F | Resp 18

## 2022-04-20 DIAGNOSIS — D638 Anemia in other chronic diseases classified elsewhere: Secondary | ICD-10-CM

## 2022-04-20 DIAGNOSIS — C50211 Malignant neoplasm of upper-inner quadrant of right female breast: Secondary | ICD-10-CM

## 2022-04-20 DIAGNOSIS — D631 Anemia in chronic kidney disease: Secondary | ICD-10-CM | POA: Insufficient documentation

## 2022-04-20 DIAGNOSIS — N184 Chronic kidney disease, stage 4 (severe): Secondary | ICD-10-CM | POA: Diagnosis not present

## 2022-04-20 DIAGNOSIS — D500 Iron deficiency anemia secondary to blood loss (chronic): Secondary | ICD-10-CM

## 2022-04-20 LAB — CBC WITH DIFFERENTIAL/PLATELET
Abs Immature Granulocytes: 0.01 10*3/uL (ref 0.00–0.07)
Basophils Absolute: 0 10*3/uL (ref 0.0–0.1)
Basophils Relative: 0 %
Eosinophils Absolute: 0.1 10*3/uL (ref 0.0–0.5)
Eosinophils Relative: 2 %
HCT: 28.3 % — ABNORMAL LOW (ref 36.0–46.0)
Hemoglobin: 8.7 g/dL — ABNORMAL LOW (ref 12.0–15.0)
Immature Granulocytes: 0 %
Lymphocytes Relative: 19 %
Lymphs Abs: 1.1 10*3/uL (ref 0.7–4.0)
MCH: 29.6 pg (ref 26.0–34.0)
MCHC: 30.7 g/dL (ref 30.0–36.0)
MCV: 96.3 fL (ref 80.0–100.0)
Monocytes Absolute: 0.4 10*3/uL (ref 0.1–1.0)
Monocytes Relative: 7 %
Neutro Abs: 4.1 10*3/uL (ref 1.7–7.7)
Neutrophils Relative %: 72 %
Platelets: 190 10*3/uL (ref 150–400)
RBC: 2.94 MIL/uL — ABNORMAL LOW (ref 3.87–5.11)
RDW: 14.9 % (ref 11.5–15.5)
WBC: 5.7 10*3/uL (ref 4.0–10.5)
nRBC: 0 % (ref 0.0–0.2)

## 2022-04-20 LAB — COMPREHENSIVE METABOLIC PANEL
ALT: 12 U/L (ref 0–44)
AST: 18 U/L (ref 15–41)
Albumin: 3.8 g/dL (ref 3.5–5.0)
Alkaline Phosphatase: 58 U/L (ref 38–126)
Anion gap: 4 — ABNORMAL LOW (ref 5–15)
BUN: 27 mg/dL — ABNORMAL HIGH (ref 8–23)
CO2: 32 mmol/L (ref 22–32)
Calcium: 9.1 mg/dL (ref 8.9–10.3)
Chloride: 105 mmol/L (ref 98–111)
Creatinine, Ser: 1.25 mg/dL — ABNORMAL HIGH (ref 0.44–1.00)
GFR, Estimated: 42 mL/min — ABNORMAL LOW (ref >60)
Glucose, Bld: 94 mg/dL (ref 70–99)
Potassium: 4.4 mmol/L (ref 3.5–5.1)
Sodium: 141 mmol/L (ref 135–145)
Total Bilirubin: 0.3 mg/dL (ref 0.3–1.2)
Total Protein: 7 g/dL (ref 6.5–8.1)

## 2022-04-20 LAB — FERRITIN: Ferritin: 209 ng/mL (ref 11–307)

## 2022-04-20 MED ORDER — EPOETIN ALFA-EPBX 10000 UNIT/ML IJ SOLN
10000.0000 [IU] | Freq: Once | INTRAMUSCULAR | Status: AC
  Administered 2022-04-20: 10000 [IU] via SUBCUTANEOUS
  Filled 2022-04-20: qty 1

## 2022-04-20 NOTE — Patient Instructions (Signed)

## 2022-04-22 ENCOUNTER — Other Ambulatory Visit: Payer: Self-pay | Admitting: Pulmonary Disease

## 2022-04-25 ENCOUNTER — Telehealth: Payer: Self-pay | Admitting: Pulmonary Disease

## 2022-04-25 MED ORDER — TRELEGY ELLIPTA 200-62.5-25 MCG/ACT IN AEPB: 1.0000 | INHALATION_SPRAY | Freq: Every day | RESPIRATORY_TRACT | 5 refills | Status: DC

## 2022-04-25 NOTE — Telephone Encounter (Signed)
Called and spoke with pt letting her know that we would refill her Rx for Trelegy and she verbalized understanding. Also scheduled pt for a f/u with Dr. Loanne Drilling. Nothing further needed.

## 2022-04-25 NOTE — Telephone Encounter (Signed)
PT calling for Trelegy refill. States Pharm has not been aboe to get thru to [place] by phone.  [facility] [phone] is contact number. Pls call PT to advise @ [phone]

## 2022-05-02 DIAGNOSIS — Z1331 Encounter for screening for depression: Secondary | ICD-10-CM | POA: Diagnosis not present

## 2022-05-02 DIAGNOSIS — R739 Hyperglycemia, unspecified: Secondary | ICD-10-CM | POA: Diagnosis not present

## 2022-05-02 DIAGNOSIS — D6869 Other thrombophilia: Secondary | ICD-10-CM | POA: Diagnosis not present

## 2022-05-02 DIAGNOSIS — Z0000 Encounter for general adult medical examination without abnormal findings: Secondary | ICD-10-CM | POA: Diagnosis not present

## 2022-05-02 DIAGNOSIS — I5032 Chronic diastolic (congestive) heart failure: Secondary | ICD-10-CM | POA: Diagnosis not present

## 2022-05-02 DIAGNOSIS — N1832 Chronic kidney disease, stage 3b: Secondary | ICD-10-CM | POA: Diagnosis not present

## 2022-05-02 DIAGNOSIS — Z0001 Encounter for general adult medical examination with abnormal findings: Secondary | ICD-10-CM | POA: Diagnosis not present

## 2022-05-02 DIAGNOSIS — J9611 Chronic respiratory failure with hypoxia: Secondary | ICD-10-CM | POA: Diagnosis not present

## 2022-05-02 DIAGNOSIS — I700 Atherosclerosis of aorta: Secondary | ICD-10-CM | POA: Diagnosis not present

## 2022-05-02 DIAGNOSIS — E261 Secondary hyperaldosteronism: Secondary | ICD-10-CM | POA: Diagnosis not present

## 2022-05-02 DIAGNOSIS — Z23 Encounter for immunization: Secondary | ICD-10-CM | POA: Diagnosis not present

## 2022-05-02 DIAGNOSIS — Z131 Encounter for screening for diabetes mellitus: Secondary | ICD-10-CM | POA: Diagnosis not present

## 2022-05-03 ENCOUNTER — Ambulatory Visit (HOSPITAL_BASED_OUTPATIENT_CLINIC_OR_DEPARTMENT_OTHER): Payer: Medicare HMO | Admitting: Pulmonary Disease

## 2022-05-03 ENCOUNTER — Encounter: Payer: Self-pay | Admitting: Hematology

## 2022-05-03 ENCOUNTER — Other Ambulatory Visit (HOSPITAL_BASED_OUTPATIENT_CLINIC_OR_DEPARTMENT_OTHER): Payer: Self-pay

## 2022-05-03 ENCOUNTER — Ambulatory Visit (INDEPENDENT_AMBULATORY_CARE_PROVIDER_SITE_OTHER): Payer: Medicare HMO

## 2022-05-03 ENCOUNTER — Encounter (HOSPITAL_BASED_OUTPATIENT_CLINIC_OR_DEPARTMENT_OTHER): Payer: Self-pay | Admitting: Pulmonary Disease

## 2022-05-03 VITALS — BP 130/64 | HR 66 | Ht 60.0 in | Wt 161.4 lb

## 2022-05-03 DIAGNOSIS — J9621 Acute and chronic respiratory failure with hypoxia: Secondary | ICD-10-CM

## 2022-05-03 DIAGNOSIS — I5033 Acute on chronic diastolic (congestive) heart failure: Secondary | ICD-10-CM | POA: Diagnosis not present

## 2022-05-03 DIAGNOSIS — J449 Chronic obstructive pulmonary disease, unspecified: Secondary | ICD-10-CM

## 2022-05-03 DIAGNOSIS — R0602 Shortness of breath: Secondary | ICD-10-CM | POA: Diagnosis not present

## 2022-05-03 DIAGNOSIS — J439 Emphysema, unspecified: Secondary | ICD-10-CM | POA: Diagnosis not present

## 2022-05-03 MED ORDER — ALBUTEROL SULFATE HFA 108 (90 BASE) MCG/ACT IN AERS
2.0000 | INHALATION_SPRAY | Freq: Four times a day (QID) | RESPIRATORY_TRACT | 0 refills | Status: DC | PRN
  Filled 2022-05-03: qty 6.7, 25d supply, fill #0

## 2022-05-03 NOTE — Patient Instructions (Signed)
  COPD Chronic respiratory failure CONTINUE Trelegy 200 ONE puff ONCE a day. Rinse mouth out after use CONTINUE Albuterol AS NEEDED for shortness of breath or wheezing. Refilled INCREASE to 4L oxygen via nasal cannula with activity and sleep Goal for SpO2 >88% REFER to pulmonary rehab  Chronic diastolic heart failure Lower extremity swelling Take additional 1/2 pill (20 mg) for increased weight of 3 lbs in a day or 5 lbs in a week If you are taking this frequently, please contact your cardiologist

## 2022-05-03 NOTE — Progress Notes (Signed)
Subjective:   PATIENT ID: Courtney Cline GENDER: female DOB: 1933/05/01, MRN: [id number]   HPI  Chief Complaint  Patient presents with   Follow-up    States feels like oxygen is been getting low    Reason for Visit: Follow-up  Courtney Cline is an 89 year old female former smoker with COPD, chronic respiratory failure, chronic diastolic heart failure, moderate pulmonary hypertension, hypertension, atrial fibrillation, subclavian arterial stenosis, hyperlipidemia, CKD IV, history of breast cancer.   Initial consult 08/2021 She is a former [name] patient.  Last seen on 07/17/2019.  She was seen again in clinic 08/31/2020 with NP Volanda Napoleon.  Ambulatory O2 follow-up demonstrated stable O2 requirement of 3 L. she is currently on Trelegy 100.   She is compliant with Trelegy daily and uses albuterol 2-3 times after exertion. Occasional sputum production. Denies wheezing. Does not have any symptoms at night. Compliant with her oxygen with activity and sleep. Has been on stable oxygen 8 years. Able to shower, dress and care for herself without assistance. Currently lives with husband. Sons help with grocery shopping and errands. She and husband no longer drive. She was last hospitalized 02/2021 for COPD exacerbation and this was followed by COVID infection after being discharged home. She took Paxlovid and recovered to baseline.  05/03/22 Son present and provides additional history. Since our last visit she was hospitalized for pneumonia in Nov 2023 requiring antibiotics. Was discharged on her home 3L O2. No further exacerbations since then so had a good holiday break. However she reports for the last few days she has had lower oxygen readings to the 70s but would return to 94-95%. Associated with shortness of breath. Some productive cough. Denies wheezing. Denies recent fever, chills or sick contacts. She is sedentary at baseline but walks around the house. Able to perform ADLs without  assistance. Reports worsening fluid in her legs as well and has not taken an extra lasix.  Social History: Former smoker. Quit in 1996. 40 pack-years. Previously housekeeper x 10 years  Past Medical History:  Diagnosis Date   Allergic rhinitis    Arthritis    Breast calcification seen on mammogram    Right breast   Breast cancer (HCC)    COPD (chronic obstructive pulmonary disease) (HCC)    GERD (gastroesophageal reflux disease)    Hyperglycemia    Hyperlipidemia    Hypertension    Low back pain    On home oxygen therapy    all the time   Osteopenia    Peripheral edema    Shortness of breath    Subclavian artery stenosis, left (HCC)    Vitamin D deficiency    Wears dentures    top   Wears glasses    reading     Family History  Problem Relation Age of Onset   Emphysema Mother    Emphysema Maternal Grandfather      Social History   Occupational History   Occupation: retired    Comment: Chartered certified accountant  Tobacco Use   Smoking status: Former    Packs/day: 1.00    Years: 40.00    Additional pack years: 0.00    Total pack years: 40.00    Types: Cigarettes    Quit date: 02/14/1994    Years since quitting: 28.2   Smokeless tobacco: Never  Vaping Use   Vaping Use: Never used  Substance and Sexual Activity   Alcohol use: No   Drug use: No   Sexual activity: Not  on file    No Known Allergies   Outpatient Medications Prior to Visit  Medication Sig Dispense Refill   acetaminophen (TYLENOL) 325 MG tablet Take 650 mg by mouth every 6 (six) hours as needed for mild pain or headache.      albuterol (PROVENTIL) (2.5 MG/3ML) 0.083% nebulizer solution Take 2.5 mg by nebulization every 6 (six) hours as needed for wheezing or shortness of breath.     apixaban (ELIQUIS) 2.5 MG TABS tablet Take 1 tablet (2.5 mg total) by mouth 2 (two) times daily. 60 tablet 3   Cholecalciferol (VITAMIN D) 2000 UNITS CAPS Take 2,000 Units by mouth daily.      Cyanocobalamin (VITAMIN B 12 PO)  Take 1 tablet by mouth daily.     Fluticasone-Umeclidin-Vilant (TRELEGY ELLIPTA) 200-62.5-25 MCG/ACT AEPB Inhale 1 puff into the lungs daily. 60 each 5   metoprolol tartrate (LOPRESSOR) 25 MG tablet Take 1 tablet (25 mg total) by mouth 2 (two) times daily. 60 tablet 0   PROAIR HFA 108 (90 Base) MCG/ACT inhaler TAKE 2 PUFFS BY MOUTH EVERY 6 HOURS AS NEEDED FOR WHEEZE OR SHORTNESS OF BREATH (Patient taking differently: Inhale 2 puffs into the lungs every 6 (six) hours as needed for wheezing or shortness of breath.) 8.5 Inhaler 11   rosuvastatin (CRESTOR) 40 MG tablet Take 40 mg by mouth daily.     furosemide (LASIX) 40 MG tablet Take 0.5 tablets (20 mg total) by mouth 2 (two) times daily. <PLEASE MAKE APPOINTMENT FOR REFILLS> 30 tablet 0   No facility-administered medications prior to visit.    Review of Systems  Constitutional:  Negative for chills, diaphoresis, fever, malaise/fatigue and weight loss.  HENT:  Negative for congestion.   Respiratory:  Positive for cough, sputum production and shortness of breath. Negative for hemoptysis and wheezing.   Cardiovascular:  Positive for leg swelling. Negative for chest pain and palpitations.     Objective:   Vitals:   05/03/22 1500  BP: 130/64  Pulse: 66  SpO2: 97%  Weight: 161 lb 6.4 oz (73.2 kg)  Height: 5' (1.524 m)   SpO2: 97 % (3L) O2 Device: None (Room air)  Physical Exam: General: Elderly, frail-appearing, no acute distress HENT: [place], AT Eyes: EOMI, no scleral icterus Respiratory: Diminished right lung base.  No crackles, wheezing or rales Cardiovascular: RRR, -M/R/G, no JVD Extremities:1 + pitting edema in lower extremities,-tenderness Neuro: AAO x4, CNII-XII grossly intact Psych: Normal mood, normal affect   Data Reviewed:  Imaging: CXR 02/15/21 - Hyperinflation. Unchanged basilar densities R>L CXR 05/03/22 - Increased right pleural effusion  PFT: 07/17/17 FVC 1.3 (60%) FEV1 0.5 (31%) Ratio 38  Interpretation: Very  severe obstructive defect  Labs: CBC    Component Value Date/Time   WBC 5.7 04/20/2022 1358   RBC 2.94 (L) 04/20/2022 1358   HGB 8.7 (L) 04/20/2022 1358   HGB 9.3 (L) 12/24/2018 1116   HGB 9.6 (L) 11/07/2016 0932   HCT 28.3 (L) 04/20/2022 1358   HCT 31.0 (L) 11/07/2016 0932   PLT 190 04/20/2022 1358   PLT 223 12/24/2018 1116   PLT 205 11/07/2016 0932   MCV 96.3 04/20/2022 1358   MCV 96.6 11/07/2016 0932   MCH 29.6 04/20/2022 1358   MCHC 30.7 04/20/2022 1358   RDW 14.9 04/20/2022 1358   RDW 13.4 11/07/2016 0932   LYMPHSABS 1.1 04/20/2022 1358   LYMPHSABS 1.3 11/07/2016 0932   MONOABS 0.4 04/20/2022 1358   MONOABS 0.4 11/07/2016 0932   EOSABS 0.1  04/20/2022 1358   EOSABS 0.2 11/07/2016 0932   BASOSABS 0.0 04/20/2022 1358   BASOSABS 0.0 11/07/2016 0932       Assessment & Plan:   Discussion: 89 year old female former smoker with COPD, chronic respiratory failure, chronic diastolic heart failure, moderate pulmonary hypertension, HTN, atrial fibrillation, subclavian arterial stenosis, HLD, hx breast cancer who presents for follow-up. Last exacerbations requiring hospitalizations in January and November 2023. Stable symptoms on high dose triple therapy since last hospital discharge however now with evidence of volume overload associated with increased O2 requirement on ambulatory O2. CXR obtained and reviewed personally by me with increased right pleural effusion seen. Advised to take additional lasix and daily weights with instructions on when to take increased lasix. Her dyspnea is multifactorial and she would likely benefit from rehab after acute management of volume overload.   COPD Acute on chronic respiratory failure 2/2 acute on chronic diastolic heart failure, pleural effusion, deconditioning CONTINUE Trelegy 200 ONE puff ONCE a day. Rinse mouth out after use CONTINUE Albuterol AS NEEDED for shortness of breath or wheezing. Refilled INCREASE to 4L oxygen via nasal cannula  with activity and sleep Goal for SpO2 >88% REFER to pulmonary rehab  Chronic diastolic heart failure Lower extremity swelling Take additional 1/2 pill (20 mg) for increased weight of 3 lbs in a day or 5 lbs in a week If you are taking this frequently, please contact your cardiologist   Health Maintenance Immunization History  Administered Date(s) Administered   Fluad Quad(high Dose 65+) 10/15/2018   Influenza Split 11/14/2001, 11/15/2002, 01/15/2004, 12/15/2004, 11/14/2005, 11/28/2008, 11/23/2012, 11/14/2014, 11/21/2016, 12/17/2019, 10/28/2020   Influenza, High Dose Seasonal PF 11/16/2015, 11/10/2016   Influenza,inj,Quad PF,6+ Mos 11/11/2013   Influenza-Unspecified 11/17/2017   Moderna Sars-Covid-2 Vaccination 04/13/2019, 05/11/2019, 12/16/2019, 10/28/2020   Pneumococcal Conjugate-13 11/25/2013, 12/11/2013   Pneumococcal Polysaccharide-23 11/15/2002, 07/26/2016   Td 04/05/2001   Tdap 11/10/2011   Zoster, Live 02/26/2020, 08/26/2020   CT Lung Screen - not qualified due to age and >15 years since tobacco use  Orders Placed This Encounter  Procedures   DG Chest 2 View    Standing Status:   Future    Number of Occurrences:   1    Standing Expiration Date:   05/03/2023    Order Specific Question:   Reason for Exam (SYMPTOM  OR DIAGNOSIS REQUIRED)    Answer:   sob    Order Specific Question:   Preferred imaging location?    Answer:   [facility]   AMB referral to pulmonary rehabilitation    Referral Priority:   Routine    Referral Type:   Consultation    Number of Visits Requested:   1   Ambulatory Referral for DME    Referral Priority:   Routine    Referral Type:   Durable Medical Equipment Purchase    Number of Visits Requested:   1   Meds ordered this encounter  Medications   albuterol (VENTOLIN HFA) 108 (90 Base) MCG/ACT inhaler    Sig: Inhale 2 puffs into the lungs every 6 (six) hours as needed for wheezing or shortness of breath.    Dispense:  6.7 g     Refill:  0    Ok to change to insurance covered brand    Return in about 3 months (around 08/03/2022) for routine follow-up, with Dr. Loanne Drilling.  I have spent a total time of 48 -minutes on the day of the appointment including chart review, data review, collecting  history, coordinating care and discussing medical diagnosis and plan with the patient/family. Past medical history, allergies, medications were reviewed. Pertinent imaging, labs and tests included in this note have been reviewed and interpreted independently by me.  [place] [facility], MD [facility] 05/03/2022 9:23 PM  Office Number [phone]

## 2022-05-04 ENCOUNTER — Inpatient Hospital Stay: Payer: Medicare HMO

## 2022-05-04 NOTE — Addendum Note (Signed)
Addended by: Margaretha Seeds on: 05/04/2022 01:37 PM   Modules accepted: Orders

## 2022-05-11 ENCOUNTER — Encounter (HOSPITAL_COMMUNITY): Payer: Self-pay

## 2022-05-18 ENCOUNTER — Other Ambulatory Visit: Payer: Self-pay

## 2022-05-18 ENCOUNTER — Inpatient Hospital Stay: Payer: Medicare HMO | Attending: Hematology

## 2022-05-18 ENCOUNTER — Inpatient Hospital Stay: Payer: Medicare HMO

## 2022-05-18 VITALS — BP 159/45 | HR 60 | Temp 98.4°F | Resp 20

## 2022-05-18 DIAGNOSIS — D638 Anemia in other chronic diseases classified elsewhere: Secondary | ICD-10-CM

## 2022-05-18 DIAGNOSIS — N184 Chronic kidney disease, stage 4 (severe): Secondary | ICD-10-CM | POA: Diagnosis not present

## 2022-05-18 DIAGNOSIS — D500 Iron deficiency anemia secondary to blood loss (chronic): Secondary | ICD-10-CM

## 2022-05-18 DIAGNOSIS — C50211 Malignant neoplasm of upper-inner quadrant of right female breast: Secondary | ICD-10-CM

## 2022-05-18 DIAGNOSIS — D631 Anemia in chronic kidney disease: Secondary | ICD-10-CM | POA: Diagnosis not present

## 2022-05-18 DIAGNOSIS — I129 Hypertensive chronic kidney disease with stage 1 through stage 4 chronic kidney disease, or unspecified chronic kidney disease: Secondary | ICD-10-CM | POA: Insufficient documentation

## 2022-05-18 LAB — FERRITIN: Ferritin: 94 ng/mL (ref 11–307)

## 2022-05-18 LAB — CBC WITH DIFFERENTIAL/PLATELET
Abs Immature Granulocytes: 0.01 10*3/uL (ref 0.00–0.07)
Basophils Absolute: 0 10*3/uL (ref 0.0–0.1)
Basophils Relative: 0 %
Eosinophils Absolute: 0.1 10*3/uL (ref 0.0–0.5)
Eosinophils Relative: 2 %
HCT: 27.4 % — ABNORMAL LOW (ref 36.0–46.0)
Hemoglobin: 8.5 g/dL — ABNORMAL LOW (ref 12.0–15.0)
Immature Granulocytes: 0 %
Lymphocytes Relative: 18 %
Lymphs Abs: 1 10*3/uL (ref 0.7–4.0)
MCH: 29.6 pg (ref 26.0–34.0)
MCHC: 31 g/dL (ref 30.0–36.0)
MCV: 95.5 fL (ref 80.0–100.0)
Monocytes Absolute: 0.3 10*3/uL (ref 0.1–1.0)
Monocytes Relative: 6 %
Neutro Abs: 4.1 10*3/uL (ref 1.7–7.7)
Neutrophils Relative %: 74 %
Platelets: 193 10*3/uL (ref 150–400)
RBC: 2.87 MIL/uL — ABNORMAL LOW (ref 3.87–5.11)
RDW: 14.5 % (ref 11.5–15.5)
WBC: 5.5 10*3/uL (ref 4.0–10.5)
nRBC: 0 % (ref 0.0–0.2)

## 2022-05-18 LAB — CMP (CANCER CENTER ONLY)
ALT: 11 U/L (ref 0–44)
AST: 18 U/L (ref 15–41)
Albumin: 3.8 g/dL (ref 3.5–5.0)
Alkaline Phosphatase: 51 U/L (ref 38–126)
Anion gap: 8 (ref 5–15)
BUN: 23 mg/dL (ref 8–23)
CO2: 28 mmol/L (ref 22–32)
Calcium: 8.8 mg/dL — ABNORMAL LOW (ref 8.9–10.3)
Chloride: 101 mmol/L (ref 98–111)
Creatinine: 1.21 mg/dL — ABNORMAL HIGH (ref 0.44–1.00)
GFR, Estimated: 44 mL/min — ABNORMAL LOW (ref >60)
Glucose, Bld: 88 mg/dL (ref 70–99)
Potassium: 4.5 mmol/L (ref 3.5–5.1)
Sodium: 137 mmol/L (ref 135–145)
Total Bilirubin: 0.4 mg/dL (ref 0.3–1.2)
Total Protein: 6.9 g/dL (ref 6.5–8.1)

## 2022-05-18 MED ORDER — EPOETIN ALFA-EPBX 10000 UNIT/ML IJ SOLN
10000.0000 [IU] | Freq: Once | INTRAMUSCULAR | Status: AC
  Administered 2022-05-18: 10000 [IU] via SUBCUTANEOUS
  Filled 2022-05-18: qty 1

## 2022-06-01 ENCOUNTER — Inpatient Hospital Stay: Payer: Medicare HMO

## 2022-06-13 ENCOUNTER — Telehealth: Payer: Self-pay | Admitting: Hematology

## 2022-06-13 NOTE — Telephone Encounter (Signed)
patient left voicemail to cancel upcoming appointments, reached back out to patient left voicemail.

## 2022-06-15 ENCOUNTER — Inpatient Hospital Stay: Payer: Medicare HMO

## 2022-06-22 ENCOUNTER — Other Ambulatory Visit (HOSPITAL_BASED_OUTPATIENT_CLINIC_OR_DEPARTMENT_OTHER): Payer: Self-pay

## 2022-06-22 ENCOUNTER — Inpatient Hospital Stay: Payer: Medicare HMO | Attending: Hematology

## 2022-06-22 ENCOUNTER — Other Ambulatory Visit: Payer: Self-pay

## 2022-06-22 ENCOUNTER — Inpatient Hospital Stay: Payer: Medicare HMO

## 2022-06-22 VITALS — BP 172/50 | HR 64 | Temp 98.1°F | Resp 18

## 2022-06-22 DIAGNOSIS — D631 Anemia in chronic kidney disease: Secondary | ICD-10-CM | POA: Diagnosis not present

## 2022-06-22 DIAGNOSIS — Z7901 Long term (current) use of anticoagulants: Secondary | ICD-10-CM | POA: Diagnosis not present

## 2022-06-22 DIAGNOSIS — I4891 Unspecified atrial fibrillation: Secondary | ICD-10-CM | POA: Insufficient documentation

## 2022-06-22 DIAGNOSIS — I129 Hypertensive chronic kidney disease with stage 1 through stage 4 chronic kidney disease, or unspecified chronic kidney disease: Secondary | ICD-10-CM | POA: Insufficient documentation

## 2022-06-22 DIAGNOSIS — Z853 Personal history of malignant neoplasm of breast: Secondary | ICD-10-CM | POA: Diagnosis not present

## 2022-06-22 DIAGNOSIS — D638 Anemia in other chronic diseases classified elsewhere: Secondary | ICD-10-CM

## 2022-06-22 DIAGNOSIS — D500 Iron deficiency anemia secondary to blood loss (chronic): Secondary | ICD-10-CM

## 2022-06-22 DIAGNOSIS — N184 Chronic kidney disease, stage 4 (severe): Secondary | ICD-10-CM | POA: Diagnosis not present

## 2022-06-22 LAB — CBC WITH DIFFERENTIAL/PLATELET
Abs Immature Granulocytes: 0.01 10*3/uL (ref 0.00–0.07)
Basophils Absolute: 0 10*3/uL (ref 0.0–0.1)
Basophils Relative: 1 %
Eosinophils Absolute: 0.2 10*3/uL (ref 0.0–0.5)
Eosinophils Relative: 3 %
HCT: 31.7 % — ABNORMAL LOW (ref 36.0–46.0)
Hemoglobin: 9.9 g/dL — ABNORMAL LOW (ref 12.0–15.0)
Immature Granulocytes: 0 %
Lymphocytes Relative: 21 %
Lymphs Abs: 1.2 10*3/uL (ref 0.7–4.0)
MCH: 29.7 pg (ref 26.0–34.0)
MCHC: 31.2 g/dL (ref 30.0–36.0)
MCV: 95.2 fL (ref 80.0–100.0)
Monocytes Absolute: 0.4 10*3/uL (ref 0.1–1.0)
Monocytes Relative: 7 %
Neutro Abs: 3.8 10*3/uL (ref 1.7–7.7)
Neutrophils Relative %: 68 %
Platelets: 179 10*3/uL (ref 150–400)
RBC: 3.33 MIL/uL — ABNORMAL LOW (ref 3.87–5.11)
RDW: 14.1 % (ref 11.5–15.5)
WBC: 5.5 10*3/uL (ref 4.0–10.5)
nRBC: 0 % (ref 0.0–0.2)

## 2022-06-22 LAB — FERRITIN: Ferritin: 76 ng/mL (ref 11–307)

## 2022-06-22 MED ORDER — EPOETIN ALFA-EPBX 10000 UNIT/ML IJ SOLN
10000.0000 [IU] | Freq: Once | INTRAMUSCULAR | Status: AC
  Administered 2022-06-22: 10000 [IU] via SUBCUTANEOUS
  Filled 2022-06-22: qty 1

## 2022-06-22 NOTE — Progress Notes (Signed)
Pt. Here for Retacrit injection.  Blood pressure @ 1440 165/50 and recheck @1448  172/50.  Dr. Mosetta Putt made aware and states to go ahead and treat.

## 2022-06-29 ENCOUNTER — Inpatient Hospital Stay: Payer: Medicare HMO | Admitting: Hematology

## 2022-06-29 ENCOUNTER — Inpatient Hospital Stay: Payer: Medicare HMO

## 2022-07-11 ENCOUNTER — Other Ambulatory Visit: Payer: Self-pay | Admitting: Hematology

## 2022-07-13 ENCOUNTER — Inpatient Hospital Stay: Payer: Medicare HMO | Admitting: Hematology

## 2022-07-13 ENCOUNTER — Inpatient Hospital Stay: Payer: Medicare HMO

## 2022-07-15 ENCOUNTER — Other Ambulatory Visit: Payer: Self-pay

## 2022-07-18 ENCOUNTER — Encounter: Payer: Self-pay | Admitting: Podiatry

## 2022-07-18 ENCOUNTER — Ambulatory Visit: Payer: Medicare HMO | Admitting: Podiatry

## 2022-07-18 VITALS — Ht 61.0 in

## 2022-07-18 DIAGNOSIS — M7662 Achilles tendinitis, left leg: Secondary | ICD-10-CM

## 2022-07-18 DIAGNOSIS — M79675 Pain in left toe(s): Secondary | ICD-10-CM

## 2022-07-18 DIAGNOSIS — B351 Tinea unguium: Secondary | ICD-10-CM | POA: Diagnosis not present

## 2022-07-18 DIAGNOSIS — M79674 Pain in right toe(s): Secondary | ICD-10-CM

## 2022-07-19 ENCOUNTER — Other Ambulatory Visit: Payer: Self-pay

## 2022-07-19 DIAGNOSIS — C50211 Malignant neoplasm of upper-inner quadrant of right female breast: Secondary | ICD-10-CM

## 2022-07-19 DIAGNOSIS — Z170 Estrogen receptor positive status [ER+]: Secondary | ICD-10-CM

## 2022-07-19 DIAGNOSIS — D638 Anemia in other chronic diseases classified elsewhere: Secondary | ICD-10-CM

## 2022-07-20 ENCOUNTER — Inpatient Hospital Stay: Payer: Medicare HMO

## 2022-07-20 ENCOUNTER — Other Ambulatory Visit: Payer: Self-pay

## 2022-07-20 ENCOUNTER — Inpatient Hospital Stay: Payer: Medicare HMO | Attending: Hematology

## 2022-07-20 ENCOUNTER — Encounter: Payer: Self-pay | Admitting: Hematology

## 2022-07-20 ENCOUNTER — Inpatient Hospital Stay: Payer: Medicare HMO | Admitting: Hematology

## 2022-07-20 VITALS — BP 169/65 | HR 62 | Temp 98.5°F | Resp 18 | Ht 61.0 in | Wt 159.1 lb

## 2022-07-20 DIAGNOSIS — C50211 Malignant neoplasm of upper-inner quadrant of right female breast: Secondary | ICD-10-CM

## 2022-07-20 DIAGNOSIS — D638 Anemia in other chronic diseases classified elsewhere: Secondary | ICD-10-CM | POA: Diagnosis not present

## 2022-07-20 DIAGNOSIS — D631 Anemia in chronic kidney disease: Secondary | ICD-10-CM | POA: Insufficient documentation

## 2022-07-20 DIAGNOSIS — I129 Hypertensive chronic kidney disease with stage 1 through stage 4 chronic kidney disease, or unspecified chronic kidney disease: Secondary | ICD-10-CM | POA: Diagnosis not present

## 2022-07-20 DIAGNOSIS — N184 Chronic kidney disease, stage 4 (severe): Secondary | ICD-10-CM | POA: Insufficient documentation

## 2022-07-20 DIAGNOSIS — Z170 Estrogen receptor positive status [ER+]: Secondary | ICD-10-CM

## 2022-07-20 DIAGNOSIS — Z79899 Other long term (current) drug therapy: Secondary | ICD-10-CM | POA: Diagnosis not present

## 2022-07-20 LAB — CMP (CANCER CENTER ONLY)
ALT: 15 U/L (ref 0–44)
AST: 21 U/L (ref 15–41)
Albumin: 3.7 g/dL (ref 3.5–5.0)
Alkaline Phosphatase: 63 U/L (ref 38–126)
Anion gap: 4 — ABNORMAL LOW (ref 5–15)
BUN: 26 mg/dL — ABNORMAL HIGH (ref 8–23)
CO2: 33 mmol/L — ABNORMAL HIGH (ref 22–32)
Calcium: 9.4 mg/dL (ref 8.9–10.3)
Chloride: 104 mmol/L (ref 98–111)
Creatinine: 1.3 mg/dL — ABNORMAL HIGH (ref 0.44–1.00)
GFR, Estimated: 40 mL/min — ABNORMAL LOW (ref >60)
Glucose, Bld: 92 mg/dL (ref 70–99)
Potassium: 4.9 mmol/L (ref 3.5–5.1)
Sodium: 141 mmol/L (ref 135–145)
Total Bilirubin: 0.3 mg/dL (ref 0.3–1.2)
Total Protein: 7 g/dL (ref 6.5–8.1)

## 2022-07-20 LAB — CBC WITH DIFFERENTIAL (CANCER CENTER ONLY)
Abs Immature Granulocytes: 0.02 10*3/uL (ref 0.00–0.07)
Basophils Absolute: 0 10*3/uL (ref 0.0–0.1)
Basophils Relative: 0 %
Eosinophils Absolute: 0.1 10*3/uL (ref 0.0–0.5)
Eosinophils Relative: 2 %
HCT: 30.5 % — ABNORMAL LOW (ref 36.0–46.0)
Hemoglobin: 9.3 g/dL — ABNORMAL LOW (ref 12.0–15.0)
Immature Granulocytes: 0 %
Lymphocytes Relative: 20 %
Lymphs Abs: 1.1 10*3/uL (ref 0.7–4.0)
MCH: 29.1 pg (ref 26.0–34.0)
MCHC: 30.5 g/dL (ref 30.0–36.0)
MCV: 95.3 fL (ref 80.0–100.0)
Monocytes Absolute: 0.4 10*3/uL (ref 0.1–1.0)
Monocytes Relative: 7 %
Neutro Abs: 3.7 10*3/uL (ref 1.7–7.7)
Neutrophils Relative %: 71 %
Platelet Count: 212 10*3/uL (ref 150–400)
RBC: 3.2 MIL/uL — ABNORMAL LOW (ref 3.87–5.11)
RDW: 13.5 % (ref 11.5–15.5)
WBC Count: 5.3 10*3/uL (ref 4.0–10.5)
nRBC: 0 % (ref 0.0–0.2)

## 2022-07-20 LAB — FERRITIN: Ferritin: 86 ng/mL (ref 11–307)

## 2022-07-20 MED ORDER — EPOETIN ALFA-EPBX 10000 UNIT/ML IJ SOLN
10000.0000 [IU] | Freq: Once | INTRAMUSCULAR | Status: AC
  Administered 2022-07-20: 10000 [IU] via SUBCUTANEOUS
  Filled 2022-07-20: qty 1

## 2022-07-20 NOTE — Assessment & Plan Note (Signed)
-  diagnosed in 10/2013, s/p right lumpectomy. Completed 5 years of anastrozole in 11/2018. -most recent mammogram 08/16/21 was negative.

## 2022-07-20 NOTE — Progress Notes (Signed)
Subjective:   Patient ID: Courtney Cline, female   DOB: 89 y.o.   MRN: [id number]   HPI Patient states she is getting a lot of pain in her nailbeds and she cannot cut them well and also has had problems with her left Achilles tendon and the back that is very sore when she puts it down   ROS      Objective:  Physical Exam  Neurovascular status unchanged with patient found to have thickened nailbeds 1-5 both feet with incurvation of the bed that can become painful and more appreciated in the posterior arch  The left heel at the insertion of the Achilles to the back of the heel no breakdown of tissue noted but a lot of strain stress on the area     Assessment:  Achilles tendinitis left that is also just a pressure related phenomenon along with chronic mycotic painful nailbeds 1-5 both feet     Plan:  H&P reviewed both conditions.  For the Achilles I dispensed heel pillows and gave instructions on how to offload weight from the area.  I then debrided nailbeds 1-5 both feet no angiogenic bleeding reappoint to recheck

## 2022-07-20 NOTE — Progress Notes (Signed)
[facility]   [phone] [phone]) [phone]   Clinic Follow up Note   Patient Care Team: Lorenda Ishihara, MD as PCP - General (Internal Medicine) Runell Gess, MD as PCP - Cardiology (Cardiology) Lupita Leash, MD as Consulting Physician (Pulmonary Disease) Malachy Mood, MD as Consulting Physician (Hematology) Harriette Bouillon, MD as Consulting Physician (General Surgery)  Date of Service:  07/20/2022  CHIEF COMPLAINT: f/u of  anemia, h/o breast cancer     CURRENT THERAPY: Retacrit injection, starting 07/01/21, was on Q2WK initially, changed to Q4WK later on    ASSESSMENT:  Courtney Cline is a 89 y.o. female with   Breast cancer of upper-inner quadrant of right female breast (HCC) -diagnosed in 10/2013, s/p right lumpectomy. Completed 5 years of anastrozole in 11/2018. -most recent mammogram 08/16/21 was negative.    Anemia of chronic disease -long standing history of low hgb secondary to CKD -she is on Eliquis for A.fib -endorses taking oral iron  -prior stool occult test was negative. -she recently presented to her PCP with nosebleeds. She was seen by Dr. Marene Lenz in ENT on 07/05/21. -we started her on retacrit injections on 07/01/21. She also receives IV Venofer for ferritin <100. She tolerates these well with no side effects.  -lab reviewed, Hg 9.3, will proceed with therapeutic injection today and continue every 4 weeks.  Will go off hemoglobin is 9-11 -Her recent ferritin was 76 last month, I have scheduled IV Venofer 300 mg next week.and again in 2 weeks       PLAN: -lab reviewed hg-9.3 -Ferritin- pending -recommend to continue the Retacrit injections every 4 weeks if Hg<11.0 -Iv Iron schedule for 6/12 and 6/26 -injection q4 weeks, ab and f/u in 6 months.  SUMMARY OF ONCOLOGIC HISTORY: Oncology History Overview Note  Breast cancer of upper-inner quadrant of right female breast   Staging form: Breast, AJCC 7th  Edition     Clinical: Stage Unknown (T1c, NX, cM0) - Signed by Renaye Rakers, MD on 11/19/2013       Staging comments: ER 100%+, PR 100%+, Ki-67 7%      Breast cancer of upper-inner quadrant of right female breast (HCC)  12/06/2012 Imaging   Bone Density performed at [facility] physicians T score Lumbar spine -0.1 (-0.7 in 2012), Right neck femur -1.4 (-1.2 before), Left neck femur -1.4 (-1.5 before) and Interpretation is OSTEOPENIA by WHO criteria.    10/11/2013 Mammogram   Abnormal Screening mammogram showing Right Breast mass.   10/23/2013 Breast US   Diagnostic mammogram and US showed an irregular hypoechoic mass at 9'0 clock position right breast 8 cm from nipple. [place] measurement 1.2x0.7x1.1 cm, no axillary adenopathy.   10/23/2013 Initial Biopsy   US guided biopsy  done with clip placement.   10/23/2013 Pathology Results   Invasive ductal carcinoma (IDC), DCIS, invasive cancer grade 2, ER 100%+, PR 100%+, Ki-67% 7%, HER-2/NEU by CISH No amplification, ratio of her2:cep17 1.00, average her2 copy number per cell 1.95 (HER 2 Negative tumor). Molecular Classification LUMINAL A.   11/01/2013 Surgery   Initial surgical evaluation by Dr Maisie Fus Cornett: "Not good surgical candidate because of pulmonary status". Recommended anti-estrogen therapy.   11/18/2013 -  Anti-estrogen oral therapy   Anastrozole 1 mg once daily   12/20/2013 Surgery   right breast lumpectomy without SLN biopsy, negative margins    12/20/2013 Pathologic Stage   pT1cNxMx, G2, LVI (-), tumor measures 1.2cm. Surgical margins are negative.     10/18/2016 Imaging  MM DIAG BREAST TOMO BILATERAL 10/18/16 IMPRESSION: No mammographic evidence of malignancy involving either breast. Expected post lumpectomy changes in the right breast.   11/08/2018 Mammogram   IMPRESSION: No evidence of malignancy in either breast. Lumpectomy changes on the right. RECOMMENDATION: Diagnostic mammogram is suggested in 1 year. (Code:DM-B-01Y)       INTERVAL HISTORY:  Courtney Cline is here for a follow up of  anemia, h/o breast cancer . She was last seen by me on 01/12/2022. She presents to the clinic accompanied by son. PT reports of having  upper back pain when she is moving around. Pt state that she is able to do a little cooking and can take care of her self. Pt is on Oxygen at home   All other systems were reviewed with the patient and are negative.  MEDICAL HISTORY:  Past Medical History:  Diagnosis Date   Allergic rhinitis    Arthritis    Breast calcification seen on mammogram    Right breast   Breast cancer (HCC)    COPD (chronic obstructive pulmonary disease) (HCC)    GERD (gastroesophageal reflux disease)    Hyperglycemia    Hyperlipidemia    Hypertension    Low back pain    On home oxygen therapy    all the time   Osteopenia    Peripheral edema    Shortness of breath    Subclavian artery stenosis, left (HCC)    Vitamin D deficiency    Wears dentures    top   Wears glasses    reading    SURGICAL HISTORY: Past Surgical History:  Procedure Laterality Date   BREAST BIOPSY     BREAST LUMPECTOMY     right 2015   BREAST LUMPECTOMY WITH RADIOACTIVE SEED LOCALIZATION Right 12/20/2013   Procedure: RIGHT BREAST LUMPECTOMY WITH RADIOACTIVE SEED LOCALIZATION;  Surgeon: Harriette Bouillon, MD;  Location: [facility];  Service: General;  Laterality: Right;   CAROTID DUPLEX  2014   CATARACT EXTRACTION     both eyes    I have reviewed the social history and family history with the patient and they are unchanged from previous note.  ALLERGIES:  has No Known Allergies.  MEDICATIONS:  Current Outpatient Medications  Medication Sig Dispense Refill   acetaminophen (TYLENOL) 325 MG tablet Take 650 mg by mouth every 6 (six) hours as needed for mild pain or headache.      albuterol (PROVENTIL) (2.5 MG/3ML) 0.083% nebulizer solution Take 2.5 mg by nebulization every 6 (six) hours as needed for  wheezing or shortness of breath.     albuterol (VENTOLIN HFA) 108 (90 Base) MCG/ACT inhaler Inhale 2 puffs into the lungs every 6 (six) hours as needed for wheezing or shortness of breath. 6.7 g 0   apixaban (ELIQUIS) 2.5 MG TABS tablet Take 1 tablet (2.5 mg total) by mouth 2 (two) times daily. 60 tablet 3   Cholecalciferol (VITAMIN D) 2000 UNITS CAPS Take 2,000 Units by mouth daily.      Cyanocobalamin (VITAMIN B 12 PO) Take 1 tablet by mouth daily.     Fluticasone-Umeclidin-Vilant (TRELEGY ELLIPTA) 200-62.5-25 MCG/ACT AEPB Inhale 1 puff into the lungs daily. 60 each 5   furosemide (LASIX) 40 MG tablet Take 0.5 tablets (20 mg total) by mouth 2 (two) times daily. <PLEASE MAKE APPOINTMENT FOR REFILLS> 30 tablet 0   metoprolol tartrate (LOPRESSOR) 25 MG tablet Take 1 tablet (25 mg total) by mouth 2 (two) times daily. 60 tablet 0  PROAIR HFA 108 (90 Base) MCG/ACT inhaler TAKE 2 PUFFS BY MOUTH EVERY 6 HOURS AS NEEDED FOR WHEEZE OR SHORTNESS OF BREATH (Patient taking differently: Inhale 2 puffs into the lungs every 6 (six) hours as needed for wheezing or shortness of breath.) 8.5 Inhaler 11   rosuvastatin (CRESTOR) 40 MG tablet Take 40 mg by mouth daily.     No current facility-administered medications for this visit.    PHYSICAL EXAMINATION: ECOG PERFORMANCE STATUS: 2 - Symptomatic, <50% confined to bed  Vitals:   07/20/22 1455  BP: (!) 169/65  Pulse: 62  Resp: 18  Temp: 98.5 F (36.9 C)  SpO2: 100%   Wt Readings from Last 3 Encounters:  07/20/22 159 lb 1.6 oz (72.2 kg)  05/03/22 161 lb 6.4 oz (73.2 kg)  04/12/22 155 lb (70.3 kg)     GENERAL:alert, no distress and comfortable SKIN: skin color normal, no rashes or significant lesions EYES: normal, Conjunctiva are pink and non-injected, sclera clear  NEURO: alert & oriented x 3 with fluent speech  LABORATORY DATA:  I have reviewed the data as listed    Latest Ref Rng & Units 07/20/2022    2:27 PM 06/22/2022    2:06 PM 05/18/2022     2:05 PM  CBC  WBC 4.0 - 10.5 K/uL 5.3  5.5  5.5   Hemoglobin 12.0 - 15.0 g/dL 9.3  9.9  8.5   Hematocrit 36.0 - 46.0 % 30.5  31.7  27.4   Platelets 150 - 400 K/uL 212  179  193         Latest Ref Rng & Units 07/20/2022    2:27 PM 05/18/2022    2:05 PM 04/20/2022    1:58 PM  CMP  Glucose 70 - 99 mg/dL 92  88  94   BUN 8 - 23 mg/dL 26  23  27    Creatinine 0.44 - 1.00 mg/dL 4.09  8.11  9.14   Sodium 135 - 145 mmol/L 141  137  141   Potassium 3.5 - 5.1 mmol/L 4.9  4.5  4.4   Chloride 98 - 111 mmol/L 104  101  105   CO2 22 - 32 mmol/L 33  28  32   Calcium 8.9 - 10.3 mg/dL 9.4  8.8  9.1   Total Protein 6.5 - 8.1 g/dL 7.0  6.9  7.0   Total Bilirubin 0.3 - 1.2 mg/dL 0.3  0.4  0.3   Alkaline Phos 38 - 126 U/L 63  51  58   AST 15 - 41 U/L 21  18  18    ALT 0 - 44 U/L 15  11  12        RADIOGRAPHIC STUDIES: I have personally reviewed the radiological images as listed and agreed with the findings in the report. No results found.    Orders Placed This Encounter  Procedures   Comprehensive metabolic panel    Standing Status:   Standing    Number of Occurrences:   10    Standing Expiration Date:   07/20/2023   All questions were answered. The patient knows to call the clinic with any problems, questions or concerns. No barriers to learning was detected. The total time spent in the appointment was 20 minutes.     Malachy Mood, MD 07/20/2022   Carolin Coy, CMA, am acting as scribe for Malachy Mood, MD.   I have reviewed the above documentation for accuracy and completeness, and I agree with the above.

## 2022-07-20 NOTE — Assessment & Plan Note (Signed)
-  long standing history of low hgb secondary to CKD -she is on Eliquis for A.fib -endorses taking oral iron  -prior stool occult test was negative. -she recently presented to her PCP with nosebleeds. She was seen by Dr. Marene Lenz in ENT on 07/05/21. -we started her on retacrit injections on 07/01/21. She also receives IV Venofer for ferritin <100. She tolerates these well with no side effects.  -lab reviewed, hgb is up to 11 today. Given this, we will hold retacrit injection today, proceed with next dose in 2 weeks, then change to every 4 weeks given good response.

## 2022-07-27 ENCOUNTER — Other Ambulatory Visit: Payer: Self-pay

## 2022-07-27 ENCOUNTER — Inpatient Hospital Stay: Payer: Medicare HMO

## 2022-07-27 VITALS — BP 160/48 | HR 67 | Temp 98.0°F | Resp 18

## 2022-07-27 DIAGNOSIS — Z79899 Other long term (current) drug therapy: Secondary | ICD-10-CM | POA: Diagnosis not present

## 2022-07-27 DIAGNOSIS — N184 Chronic kidney disease, stage 4 (severe): Secondary | ICD-10-CM | POA: Diagnosis not present

## 2022-07-27 DIAGNOSIS — C50211 Malignant neoplasm of upper-inner quadrant of right female breast: Secondary | ICD-10-CM | POA: Diagnosis not present

## 2022-07-27 DIAGNOSIS — D638 Anemia in other chronic diseases classified elsewhere: Secondary | ICD-10-CM

## 2022-07-27 DIAGNOSIS — D631 Anemia in chronic kidney disease: Secondary | ICD-10-CM | POA: Diagnosis not present

## 2022-07-27 DIAGNOSIS — I129 Hypertensive chronic kidney disease with stage 1 through stage 4 chronic kidney disease, or unspecified chronic kidney disease: Secondary | ICD-10-CM | POA: Diagnosis not present

## 2022-07-27 MED ORDER — SODIUM CHLORIDE 0.9 % IV SOLN
300.0000 mg | Freq: Once | INTRAVENOUS | Status: AC
  Administered 2022-07-27: 300 mg via INTRAVENOUS
  Filled 2022-07-27: qty 300

## 2022-07-27 MED ORDER — SODIUM CHLORIDE 0.9 % IV SOLN: Freq: Once | INTRAVENOUS | Status: AC

## 2022-07-27 NOTE — Progress Notes (Signed)
Pt declined 30 min post observation. VSS at discharge

## 2022-07-27 NOTE — Patient Instructions (Signed)

## 2022-08-03 ENCOUNTER — Other Ambulatory Visit: Payer: Medicare HMO

## 2022-08-03 ENCOUNTER — Ambulatory Visit: Payer: Medicare HMO

## 2022-08-10 ENCOUNTER — Other Ambulatory Visit: Payer: Self-pay

## 2022-08-10 ENCOUNTER — Inpatient Hospital Stay: Payer: Medicare HMO

## 2022-08-10 VITALS — BP 158/58 | HR 68 | Temp 98.2°F | Resp 18

## 2022-08-10 DIAGNOSIS — I129 Hypertensive chronic kidney disease with stage 1 through stage 4 chronic kidney disease, or unspecified chronic kidney disease: Secondary | ICD-10-CM | POA: Diagnosis not present

## 2022-08-10 DIAGNOSIS — D638 Anemia in other chronic diseases classified elsewhere: Secondary | ICD-10-CM

## 2022-08-10 DIAGNOSIS — N184 Chronic kidney disease, stage 4 (severe): Secondary | ICD-10-CM | POA: Diagnosis not present

## 2022-08-10 DIAGNOSIS — Z79899 Other long term (current) drug therapy: Secondary | ICD-10-CM | POA: Diagnosis not present

## 2022-08-10 DIAGNOSIS — C50211 Malignant neoplasm of upper-inner quadrant of right female breast: Secondary | ICD-10-CM | POA: Diagnosis not present

## 2022-08-10 DIAGNOSIS — D631 Anemia in chronic kidney disease: Secondary | ICD-10-CM | POA: Diagnosis not present

## 2022-08-10 MED ORDER — SODIUM CHLORIDE 0.9 % IV SOLN
300.0000 mg | Freq: Once | INTRAVENOUS | Status: AC
  Administered 2022-08-10: 300 mg via INTRAVENOUS
  Filled 2022-08-10: qty 300

## 2022-08-10 NOTE — Patient Instructions (Signed)

## 2022-08-17 ENCOUNTER — Other Ambulatory Visit: Payer: Self-pay

## 2022-08-17 ENCOUNTER — Inpatient Hospital Stay: Payer: Medicare HMO | Attending: Hematology

## 2022-08-17 ENCOUNTER — Inpatient Hospital Stay: Payer: Medicare HMO

## 2022-08-17 VITALS — BP 142/54 | HR 72 | Temp 98.0°F | Resp 17

## 2022-08-17 DIAGNOSIS — D638 Anemia in other chronic diseases classified elsewhere: Secondary | ICD-10-CM

## 2022-08-17 DIAGNOSIS — N184 Chronic kidney disease, stage 4 (severe): Secondary | ICD-10-CM | POA: Insufficient documentation

## 2022-08-17 DIAGNOSIS — I129 Hypertensive chronic kidney disease with stage 1 through stage 4 chronic kidney disease, or unspecified chronic kidney disease: Secondary | ICD-10-CM | POA: Insufficient documentation

## 2022-08-17 DIAGNOSIS — C50211 Malignant neoplasm of upper-inner quadrant of right female breast: Secondary | ICD-10-CM

## 2022-08-17 DIAGNOSIS — D631 Anemia in chronic kidney disease: Secondary | ICD-10-CM | POA: Insufficient documentation

## 2022-08-17 LAB — COMPREHENSIVE METABOLIC PANEL
ALT: 19 U/L (ref 0–44)
AST: 23 U/L (ref 15–41)
Albumin: 3.6 g/dL (ref 3.5–5.0)
Alkaline Phosphatase: 71 U/L (ref 38–126)
Anion gap: 4 — ABNORMAL LOW (ref 5–15)
BUN: 26 mg/dL — ABNORMAL HIGH (ref 8–23)
CO2: 33 mmol/L — ABNORMAL HIGH (ref 22–32)
Calcium: 9.5 mg/dL (ref 8.9–10.3)
Chloride: 105 mmol/L (ref 98–111)
Creatinine, Ser: 1.34 mg/dL — ABNORMAL HIGH (ref 0.44–1.00)
GFR, Estimated: 38 mL/min — ABNORMAL LOW (ref >60)
Glucose, Bld: 87 mg/dL (ref 70–99)
Potassium: 4.9 mmol/L (ref 3.5–5.1)
Sodium: 142 mmol/L (ref 135–145)
Total Bilirubin: 0.4 mg/dL (ref 0.3–1.2)
Total Protein: 6.6 g/dL (ref 6.5–8.1)

## 2022-08-17 LAB — CBC WITH DIFFERENTIAL (CANCER CENTER ONLY)
Abs Immature Granulocytes: 0.02 10*3/uL (ref 0.00–0.07)
Basophils Absolute: 0 10*3/uL (ref 0.0–0.1)
Basophils Relative: 0 %
Eosinophils Absolute: 0.2 10*3/uL (ref 0.0–0.5)
Eosinophils Relative: 2 %
HCT: 32.8 % — ABNORMAL LOW (ref 36.0–46.0)
Hemoglobin: 9.8 g/dL — ABNORMAL LOW (ref 12.0–15.0)
Immature Granulocytes: 0 %
Lymphocytes Relative: 18 %
Lymphs Abs: 1.1 10*3/uL (ref 0.7–4.0)
MCH: 29.1 pg (ref 26.0–34.0)
MCHC: 29.9 g/dL — ABNORMAL LOW (ref 30.0–36.0)
MCV: 97.3 fL (ref 80.0–100.0)
Monocytes Absolute: 0.4 10*3/uL (ref 0.1–1.0)
Monocytes Relative: 7 %
Neutro Abs: 4.5 10*3/uL (ref 1.7–7.7)
Neutrophils Relative %: 73 %
Platelet Count: 177 10*3/uL (ref 150–400)
RBC: 3.37 MIL/uL — ABNORMAL LOW (ref 3.87–5.11)
RDW: 14.5 % (ref 11.5–15.5)
WBC Count: 6.2 10*3/uL (ref 4.0–10.5)
nRBC: 0 % (ref 0.0–0.2)

## 2022-08-17 LAB — FERRITIN: Ferritin: 375 ng/mL — ABNORMAL HIGH (ref 11–307)

## 2022-08-17 MED ORDER — EPOETIN ALFA-EPBX 10000 UNIT/ML IJ SOLN
10000.0000 [IU] | Freq: Once | INTRAMUSCULAR | Status: AC
  Administered 2022-08-17: 10000 [IU] via SUBCUTANEOUS
  Filled 2022-08-17: qty 1

## 2022-09-06 DIAGNOSIS — R202 Paresthesia of skin: Secondary | ICD-10-CM | POA: Diagnosis not present

## 2022-09-06 DIAGNOSIS — G629 Polyneuropathy, unspecified: Secondary | ICD-10-CM | POA: Diagnosis not present

## 2022-09-06 DIAGNOSIS — I83813 Varicose veins of bilateral lower extremities with pain: Secondary | ICD-10-CM | POA: Diagnosis not present

## 2022-09-06 DIAGNOSIS — I480 Paroxysmal atrial fibrillation: Secondary | ICD-10-CM | POA: Diagnosis not present

## 2022-09-06 DIAGNOSIS — Z993 Dependence on wheelchair: Secondary | ICD-10-CM | POA: Diagnosis not present

## 2022-09-21 ENCOUNTER — Inpatient Hospital Stay: Payer: Medicare HMO

## 2022-09-21 ENCOUNTER — Other Ambulatory Visit: Payer: Self-pay

## 2022-09-21 ENCOUNTER — Inpatient Hospital Stay: Payer: Medicare HMO | Attending: Hematology

## 2022-09-21 VITALS — BP 164/55 | HR 67 | Temp 98.4°F | Resp 16

## 2022-09-21 DIAGNOSIS — I129 Hypertensive chronic kidney disease with stage 1 through stage 4 chronic kidney disease, or unspecified chronic kidney disease: Secondary | ICD-10-CM | POA: Diagnosis not present

## 2022-09-21 DIAGNOSIS — C50211 Malignant neoplasm of upper-inner quadrant of right female breast: Secondary | ICD-10-CM

## 2022-09-21 DIAGNOSIS — N184 Chronic kidney disease, stage 4 (severe): Secondary | ICD-10-CM | POA: Insufficient documentation

## 2022-09-21 DIAGNOSIS — D631 Anemia in chronic kidney disease: Secondary | ICD-10-CM | POA: Insufficient documentation

## 2022-09-21 DIAGNOSIS — D638 Anemia in other chronic diseases classified elsewhere: Secondary | ICD-10-CM

## 2022-09-21 LAB — CBC WITH DIFFERENTIAL (CANCER CENTER ONLY)
Abs Immature Granulocytes: 0.02 10*3/uL (ref 0.00–0.07)
Basophils Absolute: 0 10*3/uL (ref 0.0–0.1)
Basophils Relative: 0 %
Eosinophils Absolute: 0.3 10*3/uL (ref 0.0–0.5)
Eosinophils Relative: 6 %
HCT: 29.5 % — ABNORMAL LOW (ref 36.0–46.0)
Hemoglobin: 9.2 g/dL — ABNORMAL LOW (ref 12.0–15.0)
Immature Granulocytes: 0 %
Lymphocytes Relative: 17 %
Lymphs Abs: 0.9 10*3/uL (ref 0.7–4.0)
MCH: 29.9 pg (ref 26.0–34.0)
MCHC: 31.2 g/dL (ref 30.0–36.0)
MCV: 95.8 fL (ref 80.0–100.0)
Monocytes Absolute: 0.4 10*3/uL (ref 0.1–1.0)
Monocytes Relative: 8 %
Neutro Abs: 3.6 10*3/uL (ref 1.7–7.7)
Neutrophils Relative %: 69 %
Platelet Count: 188 10*3/uL (ref 150–400)
RBC: 3.08 MIL/uL — ABNORMAL LOW (ref 3.87–5.11)
RDW: 14.1 % (ref 11.5–15.5)
WBC Count: 5.2 10*3/uL (ref 4.0–10.5)
nRBC: 0 % (ref 0.0–0.2)

## 2022-09-21 LAB — FERRITIN: Ferritin: 268 ng/mL (ref 11–307)

## 2022-09-21 MED ORDER — EPOETIN ALFA-EPBX 10000 UNIT/ML IJ SOLN
10000.0000 [IU] | Freq: Once | INTRAMUSCULAR | Status: AC
  Administered 2022-09-21: 10000 [IU] via SUBCUTANEOUS
  Filled 2022-09-21: qty 1

## 2022-10-19 ENCOUNTER — Inpatient Hospital Stay: Payer: Medicare HMO

## 2022-10-19 ENCOUNTER — Telehealth: Payer: Self-pay | Admitting: Hematology

## 2022-11-02 ENCOUNTER — Inpatient Hospital Stay: Payer: Medicare HMO

## 2022-11-02 ENCOUNTER — Inpatient Hospital Stay: Payer: Medicare HMO | Attending: Hematology

## 2022-11-02 VITALS — BP 111/47 | HR 60 | Temp 97.9°F | Resp 16

## 2022-11-02 DIAGNOSIS — C50211 Malignant neoplasm of upper-inner quadrant of right female breast: Secondary | ICD-10-CM

## 2022-11-02 DIAGNOSIS — I129 Hypertensive chronic kidney disease with stage 1 through stage 4 chronic kidney disease, or unspecified chronic kidney disease: Secondary | ICD-10-CM | POA: Insufficient documentation

## 2022-11-02 DIAGNOSIS — D638 Anemia in other chronic diseases classified elsewhere: Secondary | ICD-10-CM

## 2022-11-02 DIAGNOSIS — D631 Anemia in chronic kidney disease: Secondary | ICD-10-CM | POA: Diagnosis not present

## 2022-11-02 DIAGNOSIS — N184 Chronic kidney disease, stage 4 (severe): Secondary | ICD-10-CM | POA: Insufficient documentation

## 2022-11-02 LAB — CBC WITH DIFFERENTIAL (CANCER CENTER ONLY)
Abs Immature Granulocytes: 0.01 10*3/uL (ref 0.00–0.07)
Basophils Absolute: 0 10*3/uL (ref 0.0–0.1)
Basophils Relative: 0 %
Eosinophils Absolute: 0.2 10*3/uL (ref 0.0–0.5)
Eosinophils Relative: 3 %
HCT: 31.9 % — ABNORMAL LOW (ref 36.0–46.0)
Hemoglobin: 9.8 g/dL — ABNORMAL LOW (ref 12.0–15.0)
Immature Granulocytes: 0 %
Lymphocytes Relative: 18 %
Lymphs Abs: 0.9 10*3/uL (ref 0.7–4.0)
MCH: 30.1 pg (ref 26.0–34.0)
MCHC: 30.7 g/dL (ref 30.0–36.0)
MCV: 97.9 fL (ref 80.0–100.0)
Monocytes Absolute: 0.4 10*3/uL (ref 0.1–1.0)
Monocytes Relative: 7 %
Neutro Abs: 3.8 10*3/uL (ref 1.7–7.7)
Neutrophils Relative %: 72 %
Platelet Count: 162 10*3/uL (ref 150–400)
RBC: 3.26 MIL/uL — ABNORMAL LOW (ref 3.87–5.11)
RDW: 13.4 % (ref 11.5–15.5)
WBC Count: 5.2 10*3/uL (ref 4.0–10.5)
nRBC: 0 % (ref 0.0–0.2)

## 2022-11-02 MED ORDER — EPOETIN ALFA-EPBX 10000 UNIT/ML IJ SOLN
10000.0000 [IU] | Freq: Once | INTRAMUSCULAR | Status: AC
  Administered 2022-11-02: 10000 [IU] via SUBCUTANEOUS
  Filled 2022-11-02: qty 1

## 2022-11-03 ENCOUNTER — Other Ambulatory Visit: Payer: Self-pay | Admitting: Pulmonary Disease

## 2022-11-03 LAB — FERRITIN: Ferritin: 169 ng/mL (ref 11–307)

## 2022-11-16 ENCOUNTER — Inpatient Hospital Stay: Payer: Medicare HMO

## 2022-11-21 DIAGNOSIS — I739 Peripheral vascular disease, unspecified: Secondary | ICD-10-CM | POA: Diagnosis not present

## 2022-11-21 DIAGNOSIS — J9610 Chronic respiratory failure, unspecified whether with hypoxia or hypercapnia: Secondary | ICD-10-CM | POA: Diagnosis not present

## 2022-11-21 DIAGNOSIS — I5032 Chronic diastolic (congestive) heart failure: Secondary | ICD-10-CM | POA: Diagnosis not present

## 2022-11-21 DIAGNOSIS — I868 Varicose veins of other specified sites: Secondary | ICD-10-CM | POA: Diagnosis not present

## 2022-11-21 DIAGNOSIS — Z9989 Dependence on other enabling machines and devices: Secondary | ICD-10-CM | POA: Diagnosis not present

## 2022-11-21 DIAGNOSIS — I8310 Varicose veins of unspecified lower extremity with inflammation: Secondary | ICD-10-CM | POA: Diagnosis not present

## 2022-11-21 DIAGNOSIS — I4891 Unspecified atrial fibrillation: Secondary | ICD-10-CM | POA: Diagnosis not present

## 2022-11-21 DIAGNOSIS — M79606 Pain in leg, unspecified: Secondary | ICD-10-CM | POA: Diagnosis not present

## 2022-11-21 DIAGNOSIS — J449 Chronic obstructive pulmonary disease, unspecified: Secondary | ICD-10-CM | POA: Diagnosis not present

## 2022-11-30 ENCOUNTER — Inpatient Hospital Stay: Payer: Medicare HMO | Attending: Hematology

## 2022-11-30 ENCOUNTER — Inpatient Hospital Stay: Payer: Medicare HMO

## 2022-11-30 VITALS — BP 154/56 | HR 63 | Temp 98.0°F | Resp 16

## 2022-11-30 DIAGNOSIS — I129 Hypertensive chronic kidney disease with stage 1 through stage 4 chronic kidney disease, or unspecified chronic kidney disease: Secondary | ICD-10-CM | POA: Diagnosis not present

## 2022-11-30 DIAGNOSIS — N184 Chronic kidney disease, stage 4 (severe): Secondary | ICD-10-CM | POA: Diagnosis not present

## 2022-11-30 DIAGNOSIS — Z170 Estrogen receptor positive status [ER+]: Secondary | ICD-10-CM

## 2022-11-30 DIAGNOSIS — D631 Anemia in chronic kidney disease: Secondary | ICD-10-CM | POA: Diagnosis not present

## 2022-11-30 DIAGNOSIS — D638 Anemia in other chronic diseases classified elsewhere: Secondary | ICD-10-CM

## 2022-11-30 LAB — COMPREHENSIVE METABOLIC PANEL
ALT: 10 U/L (ref 0–44)
AST: 17 U/L (ref 15–41)
Albumin: 4 g/dL (ref 3.5–5.0)
Alkaline Phosphatase: 66 U/L (ref 38–126)
Anion gap: 7 (ref 5–15)
BUN: 28 mg/dL — ABNORMAL HIGH (ref 8–23)
CO2: 33 mmol/L — ABNORMAL HIGH (ref 22–32)
Calcium: 9.4 mg/dL (ref 8.9–10.3)
Chloride: 100 mmol/L (ref 98–111)
Creatinine, Ser: 1.43 mg/dL — ABNORMAL HIGH (ref 0.44–1.00)
GFR, Estimated: 35 mL/min — ABNORMAL LOW (ref >60)
Glucose, Bld: 84 mg/dL (ref 70–99)
Potassium: 4.8 mmol/L (ref 3.5–5.1)
Sodium: 140 mmol/L (ref 135–145)
Total Bilirubin: 0.4 mg/dL (ref 0.3–1.2)
Total Protein: 7.5 g/dL (ref 6.5–8.1)

## 2022-11-30 LAB — CBC WITH DIFFERENTIAL (CANCER CENTER ONLY)
Abs Immature Granulocytes: 0.02 10*3/uL (ref 0.00–0.07)
Basophils Absolute: 0 10*3/uL (ref 0.0–0.1)
Basophils Relative: 0 %
Eosinophils Absolute: 0.2 10*3/uL (ref 0.0–0.5)
Eosinophils Relative: 3 %
HCT: 33.3 % — ABNORMAL LOW (ref 36.0–46.0)
Hemoglobin: 10.3 g/dL — ABNORMAL LOW (ref 12.0–15.0)
Immature Granulocytes: 0 %
Lymphocytes Relative: 19 %
Lymphs Abs: 0.9 10*3/uL (ref 0.7–4.0)
MCH: 30.5 pg (ref 26.0–34.0)
MCHC: 30.9 g/dL (ref 30.0–36.0)
MCV: 98.5 fL (ref 80.0–100.0)
Monocytes Absolute: 0.4 10*3/uL (ref 0.1–1.0)
Monocytes Relative: 8 %
Neutro Abs: 3.3 10*3/uL (ref 1.7–7.7)
Neutrophils Relative %: 70 %
Platelet Count: 176 10*3/uL (ref 150–400)
RBC: 3.38 MIL/uL — ABNORMAL LOW (ref 3.87–5.11)
RDW: 13.2 % (ref 11.5–15.5)
WBC Count: 4.8 10*3/uL (ref 4.0–10.5)
nRBC: 0 % (ref 0.0–0.2)

## 2022-11-30 MED ORDER — EPOETIN ALFA-EPBX 10000 UNIT/ML IJ SOLN
10000.0000 [IU] | Freq: Once | INTRAMUSCULAR | Status: AC
  Administered 2022-11-30: 10000 [IU] via SUBCUTANEOUS
  Filled 2022-11-30: qty 1

## 2022-12-01 LAB — FERRITIN: Ferritin: 195 ng/mL (ref 11–307)

## 2022-12-21 ENCOUNTER — Inpatient Hospital Stay: Payer: Medicare HMO

## 2022-12-28 ENCOUNTER — Inpatient Hospital Stay: Payer: Medicare HMO

## 2022-12-28 ENCOUNTER — Other Ambulatory Visit: Payer: Self-pay | Admitting: Hematology

## 2022-12-28 ENCOUNTER — Inpatient Hospital Stay: Payer: Medicare HMO | Attending: Hematology

## 2022-12-28 VITALS — BP 119/81 | HR 75 | Temp 97.9°F | Resp 16

## 2022-12-28 DIAGNOSIS — D638 Anemia in other chronic diseases classified elsewhere: Secondary | ICD-10-CM

## 2022-12-28 DIAGNOSIS — D631 Anemia in chronic kidney disease: Secondary | ICD-10-CM | POA: Insufficient documentation

## 2022-12-28 DIAGNOSIS — N184 Chronic kidney disease, stage 4 (severe): Secondary | ICD-10-CM | POA: Insufficient documentation

## 2022-12-28 DIAGNOSIS — I129 Hypertensive chronic kidney disease with stage 1 through stage 4 chronic kidney disease, or unspecified chronic kidney disease: Secondary | ICD-10-CM | POA: Diagnosis not present

## 2022-12-28 DIAGNOSIS — C50211 Malignant neoplasm of upper-inner quadrant of right female breast: Secondary | ICD-10-CM

## 2022-12-28 LAB — CBC WITH DIFFERENTIAL (CANCER CENTER ONLY)
Abs Immature Granulocytes: 0.02 10*3/uL (ref 0.00–0.07)
Basophils Absolute: 0 10*3/uL (ref 0.0–0.1)
Basophils Relative: 1 %
Eosinophils Absolute: 0.2 10*3/uL (ref 0.0–0.5)
Eosinophils Relative: 4 %
HCT: 33.4 % — ABNORMAL LOW (ref 36.0–46.0)
Hemoglobin: 10.3 g/dL — ABNORMAL LOW (ref 12.0–15.0)
Immature Granulocytes: 0 %
Lymphocytes Relative: 20 %
Lymphs Abs: 0.9 10*3/uL (ref 0.7–4.0)
MCH: 30.2 pg (ref 26.0–34.0)
MCHC: 30.8 g/dL (ref 30.0–36.0)
MCV: 97.9 fL (ref 80.0–100.0)
Monocytes Absolute: 0.4 10*3/uL (ref 0.1–1.0)
Monocytes Relative: 8 %
Neutro Abs: 3.3 10*3/uL (ref 1.7–7.7)
Neutrophils Relative %: 67 %
Platelet Count: 158 10*3/uL (ref 150–400)
RBC: 3.41 MIL/uL — ABNORMAL LOW (ref 3.87–5.11)
RDW: 13 % (ref 11.5–15.5)
WBC Count: 4.8 10*3/uL (ref 4.0–10.5)
nRBC: 0 % (ref 0.0–0.2)

## 2022-12-28 MED ORDER — EPOETIN ALFA-EPBX 10000 UNIT/ML IJ SOLN
10000.0000 [IU] | Freq: Once | INTRAMUSCULAR | Status: AC
  Administered 2022-12-28: 10000 [IU] via SUBCUTANEOUS
  Filled 2022-12-28: qty 1

## 2022-12-29 LAB — FERRITIN: Ferritin: 146 ng/mL (ref 11–307)

## 2023-01-18 ENCOUNTER — Inpatient Hospital Stay: Payer: Medicare HMO | Admitting: Hematology

## 2023-01-18 ENCOUNTER — Inpatient Hospital Stay: Payer: Medicare HMO

## 2023-01-23 DIAGNOSIS — I480 Paroxysmal atrial fibrillation: Secondary | ICD-10-CM | POA: Diagnosis not present

## 2023-01-23 DIAGNOSIS — U071 COVID-19: Secondary | ICD-10-CM | POA: Diagnosis not present

## 2023-01-23 DIAGNOSIS — Z9989 Dependence on other enabling machines and devices: Secondary | ICD-10-CM | POA: Diagnosis not present

## 2023-01-23 DIAGNOSIS — J439 Emphysema, unspecified: Secondary | ICD-10-CM | POA: Diagnosis not present

## 2023-01-23 DIAGNOSIS — J0110 Acute frontal sinusitis, unspecified: Secondary | ICD-10-CM | POA: Diagnosis not present

## 2023-01-23 DIAGNOSIS — J069 Acute upper respiratory infection, unspecified: Secondary | ICD-10-CM | POA: Diagnosis not present

## 2023-01-24 ENCOUNTER — Telehealth: Payer: Self-pay | Admitting: Hematology

## 2023-01-25 ENCOUNTER — Inpatient Hospital Stay: Payer: Medicare HMO | Admitting: Hematology

## 2023-01-25 ENCOUNTER — Inpatient Hospital Stay: Payer: Medicare HMO

## 2023-02-13 ENCOUNTER — Other Ambulatory Visit: Payer: Self-pay

## 2023-02-13 ENCOUNTER — Encounter (HOSPITAL_COMMUNITY): Payer: Self-pay

## 2023-02-13 ENCOUNTER — Inpatient Hospital Stay (HOSPITAL_COMMUNITY)
Admission: EM | Admit: 2023-02-13 | Discharge: 2023-02-16 | DRG: 191 | Disposition: A | Discharge status: Home Health | Payer: Medicare HMO | Attending: Internal Medicine | Admitting: Internal Medicine

## 2023-02-13 ENCOUNTER — Ambulatory Visit (HOSPITAL_COMMUNITY)
Admission: EM | Admit: 2023-02-13 | Discharge: 2023-02-13 | Disposition: A | Discharge status: Other Acute Inpatient Hospital | Payer: Medicare HMO | Attending: Family Medicine | Admitting: Family Medicine

## 2023-02-13 ENCOUNTER — Emergency Department (HOSPITAL_COMMUNITY): Payer: Medicare HMO

## 2023-02-13 DIAGNOSIS — U071 COVID-19: Secondary | ICD-10-CM | POA: Diagnosis not present

## 2023-02-13 DIAGNOSIS — R0602 Shortness of breath: Secondary | ICD-10-CM | POA: Diagnosis not present

## 2023-02-13 DIAGNOSIS — Z9981 Dependence on supplemental oxygen: Secondary | ICD-10-CM

## 2023-02-13 DIAGNOSIS — I5A Non-ischemic myocardial injury (non-traumatic): Secondary | ICD-10-CM | POA: Insufficient documentation

## 2023-02-13 DIAGNOSIS — E669 Obesity, unspecified: Secondary | ICD-10-CM | POA: Insufficient documentation

## 2023-02-13 DIAGNOSIS — J189 Pneumonia, unspecified organism: Principal | ICD-10-CM

## 2023-02-13 DIAGNOSIS — Z7951 Long term (current) use of inhaled steroids: Secondary | ICD-10-CM

## 2023-02-13 DIAGNOSIS — I4892 Unspecified atrial flutter: Secondary | ICD-10-CM

## 2023-02-13 DIAGNOSIS — J9811 Atelectasis: Secondary | ICD-10-CM | POA: Diagnosis not present

## 2023-02-13 DIAGNOSIS — Z6831 Body mass index (BMI) 31.0-31.9, adult: Secondary | ICD-10-CM

## 2023-02-13 DIAGNOSIS — I4819 Other persistent atrial fibrillation: Secondary | ICD-10-CM | POA: Diagnosis present

## 2023-02-13 DIAGNOSIS — R0600 Dyspnea, unspecified: Secondary | ICD-10-CM | POA: Diagnosis not present

## 2023-02-13 DIAGNOSIS — D649 Anemia, unspecified: Secondary | ICD-10-CM | POA: Insufficient documentation

## 2023-02-13 DIAGNOSIS — I5032 Chronic diastolic (congestive) heart failure: Secondary | ICD-10-CM | POA: Diagnosis present

## 2023-02-13 DIAGNOSIS — Z79899 Other long term (current) drug therapy: Secondary | ICD-10-CM

## 2023-02-13 DIAGNOSIS — E785 Hyperlipidemia, unspecified: Secondary | ICD-10-CM | POA: Diagnosis present

## 2023-02-13 DIAGNOSIS — Z8616 Personal history of COVID-19: Secondary | ICD-10-CM

## 2023-02-13 DIAGNOSIS — R0689 Other abnormalities of breathing: Secondary | ICD-10-CM

## 2023-02-13 DIAGNOSIS — N1832 Chronic kidney disease, stage 3b: Secondary | ICD-10-CM | POA: Diagnosis present

## 2023-02-13 DIAGNOSIS — J441 Chronic obstructive pulmonary disease with (acute) exacerbation: Principal | ICD-10-CM | POA: Diagnosis present

## 2023-02-13 DIAGNOSIS — I5042 Chronic combined systolic (congestive) and diastolic (congestive) heart failure: Secondary | ICD-10-CM | POA: Diagnosis present

## 2023-02-13 DIAGNOSIS — Z825 Family history of asthma and other chronic lower respiratory diseases: Secondary | ICD-10-CM

## 2023-02-13 DIAGNOSIS — K219 Gastro-esophageal reflux disease without esophagitis: Secondary | ICD-10-CM | POA: Diagnosis present

## 2023-02-13 DIAGNOSIS — Z853 Personal history of malignant neoplasm of breast: Secondary | ICD-10-CM

## 2023-02-13 DIAGNOSIS — D631 Anemia in chronic kidney disease: Secondary | ICD-10-CM | POA: Diagnosis present

## 2023-02-13 DIAGNOSIS — J9611 Chronic respiratory failure with hypoxia: Secondary | ICD-10-CM | POA: Diagnosis present

## 2023-02-13 DIAGNOSIS — I130 Hypertensive heart and chronic kidney disease with heart failure and stage 1 through stage 4 chronic kidney disease, or unspecified chronic kidney disease: Secondary | ICD-10-CM | POA: Diagnosis present

## 2023-02-13 DIAGNOSIS — M8580 Other specified disorders of bone density and structure, unspecified site: Secondary | ICD-10-CM | POA: Diagnosis present

## 2023-02-13 DIAGNOSIS — Z7901 Long term (current) use of anticoagulants: Secondary | ICD-10-CM

## 2023-02-13 DIAGNOSIS — Z87891 Personal history of nicotine dependence: Secondary | ICD-10-CM

## 2023-02-13 DIAGNOSIS — Z66 Do not resuscitate: Secondary | ICD-10-CM | POA: Diagnosis present

## 2023-02-13 LAB — CBC WITH DIFFERENTIAL/PLATELET
Abs Immature Granulocytes: 0.03 10*3/uL (ref 0.00–0.07)
Basophils Absolute: 0 10*3/uL (ref 0.0–0.1)
Basophils Relative: 0 %
Eosinophils Absolute: 0 10*3/uL (ref 0.0–0.5)
Eosinophils Relative: 0 %
HCT: 31.4 % — ABNORMAL LOW (ref 36.0–46.0)
Hemoglobin: 9.6 g/dL — ABNORMAL LOW (ref 12.0–15.0)
Immature Granulocytes: 0 %
Lymphocytes Relative: 6 %
Lymphs Abs: 0.6 10*3/uL — ABNORMAL LOW (ref 0.7–4.0)
MCH: 29.4 pg (ref 26.0–34.0)
MCHC: 30.6 g/dL (ref 30.0–36.0)
MCV: 96.3 fL (ref 80.0–100.0)
Monocytes Absolute: 0.6 10*3/uL (ref 0.1–1.0)
Monocytes Relative: 7 %
Neutro Abs: 8.5 10*3/uL — ABNORMAL HIGH (ref 1.7–7.7)
Neutrophils Relative %: 87 %
Platelets: 166 10*3/uL (ref 150–400)
RBC: 3.26 MIL/uL — ABNORMAL LOW (ref 3.87–5.11)
RDW: 14.3 % (ref 11.5–15.5)
WBC: 9.8 10*3/uL (ref 4.0–10.5)
nRBC: 0 % (ref 0.0–0.2)

## 2023-02-13 LAB — COMPREHENSIVE METABOLIC PANEL
ALT: 9 U/L (ref 0–44)
AST: 17 U/L (ref 15–41)
Albumin: 2.9 g/dL — ABNORMAL LOW (ref 3.5–5.0)
Alkaline Phosphatase: 60 U/L (ref 38–126)
Anion gap: 12 (ref 5–15)
BUN: 18 mg/dL (ref 8–23)
CO2: 25 mmol/L (ref 22–32)
Calcium: 8.8 mg/dL — ABNORMAL LOW (ref 8.9–10.3)
Chloride: 100 mmol/L (ref 98–111)
Creatinine, Ser: 1.32 mg/dL — ABNORMAL HIGH (ref 0.44–1.00)
GFR, Estimated: 39 mL/min — ABNORMAL LOW (ref >60)
Glucose, Bld: 133 mg/dL — ABNORMAL HIGH (ref 70–99)
Potassium: 3.8 mmol/L (ref 3.5–5.1)
Sodium: 137 mmol/L (ref 135–145)
Total Bilirubin: 0.6 mg/dL (ref 0.0–1.2)
Total Protein: 6.7 g/dL (ref 6.5–8.1)

## 2023-02-13 LAB — TROPONIN I (HIGH SENSITIVITY): Troponin I (High Sensitivity): 28 ng/L — ABNORMAL HIGH (ref <18)

## 2023-02-13 LAB — BRAIN NATRIURETIC PEPTIDE: B Natriuretic Peptide: 361.3 pg/mL — ABNORMAL HIGH (ref 0.0–100.0)

## 2023-02-13 NOTE — ED Notes (Signed)
Called and informed Charge at [facility] ED of Patient transport via [facility].   Was informed that [facility] is in route.

## 2023-02-13 NOTE — ED Provider Notes (Signed)
[facility]    CSN: [id number] Arrival date & time: 02/13/23  1944      History   Chief Complaint Chief Complaint  Patient presents with   Covid Positive   Cough    HPI Courtney Cline is a 89 y.o. female.   HPI Patient with a history of COPD, Afib (anticoagulated), Right breast cancer, here today for evaluation of shortness of breath, coughing, wheezing and heart rate around 150 today.  Patient reports that she did not take any medications today including anticoagulant or metoprolol she felt poorly.  Patient diagnosed with COVID-19 approximately 3 weeks ago took antiviral therapy times abruptly altered and she was feeling great until a week after taking antivirals and symptoms return and progressively worsened over the last 2 weeks.  Patient took a home COVID test today and tested positive today .  She advised her son that her heart was beating fast and reports that her heart rate got up to 150 bpm.   Past Medical History:  Diagnosis Date   Allergic rhinitis    Arthritis    Breast calcification seen on mammogram    Right breast   Breast cancer (HCC)    COPD (chronic obstructive pulmonary disease) (HCC)    GERD (gastroesophageal reflux disease)    Hyperglycemia    Hyperlipidemia    Hypertension    Low back pain    On home oxygen therapy    all the time   Osteopenia    Peripheral edema    Shortness of breath    Subclavian artery stenosis, left (HCC)    Vitamin D deficiency    Wears dentures    top   Wears glasses    reading    Patient Active Problem List   Diagnosis Date Noted   Acute on chronic respiratory failure with hypoxia (HCC) 01/02/2022   Elevated troponin 01/02/2022   Sepsis (HCC) 01/02/2022   Aortic stenosis 05/06/2021   Malnutrition of moderate degree 02/16/2021   AKI (acute kidney injury) (HCC) 02/15/2021   CAP (community acquired pneumonia) 01/21/2020   Acute cystitis without hematuria    Persistent atrial fibrillation (HCC)  08/20/2019   COPD without exacerbation (HCC) 10/15/2018   Healthcare maintenance 10/15/2018   Physical deconditioning 10/15/2018   Sinusitis 01/29/2018   Diastolic CHF (HCC) 07/17/2017   Vitamin D deficiency    Allergic rhinitis 11/23/2015   Osteopenia with high risk of fracture 11/05/2015   Post-nasal drip 03/16/2015   Anemia of chronic disease 03/10/2015   Subclavian artery stenosis, left (HCC) 04/24/2014   Bilateral lower extremity edema 04/24/2014   Essential hypertension 04/24/2014   Hyperlipidemia 04/24/2014   Breast cancer of upper-inner quadrant of right female breast (HCC) 11/19/2013   Chronic hypoxemic respiratory failure (HCC) 10/02/2013    Past Surgical History:  Procedure Laterality Date   BREAST BIOPSY     BREAST LUMPECTOMY     right 2015   BREAST LUMPECTOMY WITH RADIOACTIVE SEED LOCALIZATION Right 12/20/2013   Procedure: RIGHT BREAST LUMPECTOMY WITH RADIOACTIVE SEED LOCALIZATION;  Surgeon: Harriette Bouillon, MD;  Location: [facility];  Service: General;  Laterality: Right;   CAROTID DUPLEX  2014   CATARACT EXTRACTION     both eyes    OB History   No obstetric history on file.      Home Medications    Prior to Admission medications   Medication Sig Start Date End Date Taking? Authorizing Provider  acetaminophen (TYLENOL) 325 MG tablet Take 650 mg  by mouth every 6 (six) hours as needed for mild pain or headache.    Yes [provider]  albuterol (PROVENTIL) (2.5 MG/3ML) 0.083% nebulizer solution Take 2.5 mg by nebulization every 6 (six) hours as needed for wheezing or shortness of breath. 01/02/22  Yes [provider]  albuterol (VENTOLIN HFA) 108 (90 Base) MCG/ACT inhaler Inhale 2 puffs into the lungs every 6 (six) hours as needed for wheezing or shortness of breath. 05/03/22  Yes Luciano Cutter, MD  apixaban (ELIQUIS) 2.5 MG TABS tablet Take 1 tablet (2.5 mg total) by mouth 2 (two) times daily. 04/05/22  Yes Runell Gess,  MD  Cholecalciferol (VITAMIN D) 2000 UNITS CAPS Take 2,000 Units by mouth daily.    Yes [provider]  Cyanocobalamin (VITAMIN B 12 PO) Take 1 tablet by mouth daily.   Yes [provider]  metoprolol tartrate (LOPRESSOR) 25 MG tablet Take 1 tablet (25 mg total) by mouth 2 (two) times daily. 07/17/19  Yes Clark, Laura P, DO  PROAIR HFA 108 (90 Base) MCG/ACT inhaler TAKE 2 PUFFS BY MOUTH EVERY 6 HOURS AS NEEDED FOR WHEEZE OR SHORTNESS OF BREATH Patient taking differently: Inhale 2 puffs into the lungs every 6 (six) hours as needed for wheezing or shortness of breath. 08/16/17  Yes Lupita Leash, MD  rosuvastatin (CRESTOR) 40 MG tablet Take 40 mg by mouth daily. 02/02/21  Yes [provider]  Dwyane Luo 200-62.5-25 MCG/ACT AEPB INHALE ONE PUFF into lungs DAILY 11/04/22  Yes Luciano Cutter, MD  furosemide (LASIX) 40 MG tablet Take 0.5 tablets (20 mg total) by mouth 2 (two) times daily. <PLEASE MAKE APPOINTMENT FOR REFILLS> 02/19/21 04/12/22  Hughie Closs, MD    Family History Family History  Problem Relation Age of Onset   Emphysema Mother    Emphysema Maternal Grandfather     Social History Social History   Tobacco Use   Smoking status: Former    Current packs/day: 0.00    Average packs/day: 1 pack/day for 40.0 years (40.0 ttl pk-yrs)    Types: Cigarettes    Start date: 02/14/1954    Quit date: 02/14/1994    Years since quitting: 29.0   Smokeless tobacco: Never  Vaping Use   Vaping status: Never Used  Substance Use Topics   Alcohol use: No   Drug use: No     Allergies   Patient has no known allergies.   Review of Systems Review of Systems Pertinent negatives listed in HPI  Physical Exam Triage Vital Signs ED Triage Vitals  Encounter Vitals Group     BP      Systolic BP Percentile      Diastolic BP Percentile      Pulse      Resp      Temp      Temp src      SpO2      Weight      Height      Head Circumference      Peak Flow       Pain Score      Pain Loc      Pain Education      Exclude from Growth Chart    No data found.  Updated Vital Signs Pulse (!) 142   Temp 99.3 F (37.4 C) (Oral)   Resp 18   Ht 5' (1.524 m)   Wt 160 lb (72.6 kg)   SpO2 95%   BMI 31.25 kg/m  Visual Acuity Right Eye Distance:   Left Eye Distance:   Bilateral Distance:    Right Eye Near:   Left Eye Near:    Bilateral Near:     Physical Exam Vitals reviewed.  Constitutional:      General: She is in acute distress.     Appearance: She is ill-appearing.  HENT:     Head: Normocephalic and atraumatic.  Eyes:     Extraocular Movements: Extraocular movements intact.  Cardiovascular:     Rate and Rhythm: Tachycardia present. Rhythm irregular.  Pulmonary:     Effort: Tachypnea present.     Breath sounds: Decreased air movement present. Decreased breath sounds present.     Comments: Home oxygen 4 L/min increased to 5 L/min as patient is having distress with breathing on exam Musculoskeletal:     Right lower leg: 2+ Pitting Edema present.     Left lower leg: 2+ Pitting Edema present.  Neurological:     Mental Status: Mental status is at baseline.      UC Treatments / Results  Labs (all labs ordered are listed, but only abnormal results are displayed) Labs Reviewed - No data to display  EKG   Radiology No results found.  Procedures Procedures (including critical care time)  Medications Ordered in UC Medications - No data to display  Initial Impression / Assessment and Plan / UC Course  I have reviewed the triage vital signs and the nursing notes.  Pertinent labs & imaging results that were available during my care of the patient were reviewed by me and considered in my medical decision making (see chart for details).    Patient with known history of COPD, hypertension with kidney disease, and atrial flutter today with acute respiratory distress and accelerated heart rate.  EKG significant for atrial flutter  2:1 AV conduction.  Patient's lung sounds are greatly diminished with decreased air movement.  Patient warrants further evaluation in the setting in the ED she has a history of chronic hypoxia respiratory distress.  [facility] to and transport patient to [facility] emergency department. Final Clinical Impressions(s) / UC Diagnoses   Final diagnoses:  Atrial flutter with rapid ventricular response (HCC)  Dyspnea and respiratory abnormality  Positive self-administered antigen test for COVID-19  COPD exacerbation [facility])     Discharge Instructions      Transported to [facility] Emergency Department via [facility]     ED Prescriptions   None    PDMP not reviewed this encounter.   Bing Neighbors, NP 02/13/23 2032

## 2023-02-13 NOTE — ED Notes (Signed)
Patient is being discharged from the Urgent Care and sent to the Emergency Department via [facility] . Per Provider, patient is in need of higher level of care due to Tachycardia, cough, congestion, Covid Positive one month ago. Patient is aware and verbalizes understanding of plan of care.  Vitals:   02/13/23 2007  Pulse: (!) 142  Resp: 18  Temp: 99.3 F (37.4 C)  SpO2: 95%

## 2023-02-13 NOTE — Discharge Instructions (Signed)
Transported to [facility] Emergency Department via [facility]

## 2023-02-13 NOTE — ED Triage Notes (Signed)
Patient was diagnosed with COVID about a month ago. Took the anti-virals and was starting to get better. Over the last week getting sicker again. HR 150, congestion, and cough.  Patient has COPD on continuous oxygen.

## 2023-02-13 NOTE — ED Triage Notes (Signed)
PT BIB [facility] from urgent care. Reports pt tested positive for covid a month ago and today.  Hx copd 4L [place] baseline, hx Afib- reports pt was 140-170's at urgent care.   Didn't take any meds including antiarrythmic meds or thinner today  d/t not feeling well  [facility] VS 154/61, HR 104 a flutter, 97% 4L [place], R 18, gcs 15

## 2023-02-14 ENCOUNTER — Encounter (HOSPITAL_COMMUNITY): Payer: Self-pay | Admitting: Family Medicine

## 2023-02-14 DIAGNOSIS — I4819 Other persistent atrial fibrillation: Secondary | ICD-10-CM | POA: Diagnosis not present

## 2023-02-14 DIAGNOSIS — J9611 Chronic respiratory failure with hypoxia: Secondary | ICD-10-CM

## 2023-02-14 DIAGNOSIS — N1832 Chronic kidney disease, stage 3b: Secondary | ICD-10-CM | POA: Diagnosis not present

## 2023-02-14 DIAGNOSIS — J441 Chronic obstructive pulmonary disease with (acute) exacerbation: Secondary | ICD-10-CM | POA: Diagnosis present

## 2023-02-14 DIAGNOSIS — I5032 Chronic diastolic (congestive) heart failure: Secondary | ICD-10-CM | POA: Diagnosis present

## 2023-02-14 DIAGNOSIS — I5A Non-ischemic myocardial injury (non-traumatic): Secondary | ICD-10-CM | POA: Insufficient documentation

## 2023-02-14 DIAGNOSIS — E669 Obesity, unspecified: Secondary | ICD-10-CM | POA: Insufficient documentation

## 2023-02-14 DIAGNOSIS — D649 Anemia, unspecified: Secondary | ICD-10-CM | POA: Insufficient documentation

## 2023-02-14 LAB — CBC
HCT: 28.3 % — ABNORMAL LOW (ref 36.0–46.0)
Hemoglobin: 8.9 g/dL — ABNORMAL LOW (ref 12.0–15.0)
MCH: 30.5 pg (ref 26.0–34.0)
MCHC: 31.4 g/dL (ref 30.0–36.0)
MCV: 96.9 fL (ref 80.0–100.0)
Platelets: 156 10*3/uL (ref 150–400)
RBC: 2.92 MIL/uL — ABNORMAL LOW (ref 3.87–5.11)
RDW: 14.4 % (ref 11.5–15.5)
WBC: 6.6 10*3/uL (ref 4.0–10.5)
nRBC: 0 % (ref 0.0–0.2)

## 2023-02-14 LAB — BASIC METABOLIC PANEL
Anion gap: 10 (ref 5–15)
BUN: 17 mg/dL (ref 8–23)
CO2: 26 mmol/L (ref 22–32)
Calcium: 8.5 mg/dL — ABNORMAL LOW (ref 8.9–10.3)
Chloride: 102 mmol/L (ref 98–111)
Creatinine, Ser: 1.21 mg/dL — ABNORMAL HIGH (ref 0.44–1.00)
GFR, Estimated: 43 mL/min — ABNORMAL LOW (ref >60)
Glucose, Bld: 124 mg/dL — ABNORMAL HIGH (ref 70–99)
Potassium: 3.9 mmol/L (ref 3.5–5.1)
Sodium: 138 mmol/L (ref 135–145)

## 2023-02-14 LAB — TROPONIN I (HIGH SENSITIVITY): Troponin I (High Sensitivity): 35 ng/L — ABNORMAL HIGH (ref <18)

## 2023-02-14 LAB — MAGNESIUM: Magnesium: 2.2 mg/dL (ref 1.7–2.4)

## 2023-02-14 MED ORDER — METOPROLOL TARTRATE 25 MG PO TABS
25.0000 mg | ORAL_TABLET | Freq: Two times a day (BID) | ORAL | Status: DC
  Administered 2023-02-14 – 2023-02-16 (×5): 25 mg via ORAL
  Filled 2023-02-14 (×5): qty 1

## 2023-02-14 MED ORDER — AZITHROMYCIN 500 MG IV SOLR
500.0000 mg | Freq: Once | INTRAVENOUS | Status: AC
  Administered 2023-02-14: 500 mg via INTRAVENOUS
  Filled 2023-02-14: qty 5

## 2023-02-14 MED ORDER — ACETAMINOPHEN 325 MG PO TABS: 650.0000 mg | ORAL_TABLET | Freq: Four times a day (QID) | ORAL | Status: DC | PRN

## 2023-02-14 MED ORDER — SODIUM CHLORIDE 0.9 % IV SOLN
2.0000 g | Freq: Once | INTRAVENOUS | Status: AC
  Administered 2023-02-14: 2 g via INTRAVENOUS
  Filled 2023-02-14: qty 20

## 2023-02-14 MED ORDER — FUROSEMIDE 20 MG PO TABS
20.0000 mg | ORAL_TABLET | Freq: Two times a day (BID) | ORAL | Status: DC
  Administered 2023-02-14: 20 mg via ORAL
  Filled 2023-02-14 (×2): qty 1

## 2023-02-14 MED ORDER — ALBUTEROL SULFATE (2.5 MG/3ML) 0.083% IN NEBU: 2.5000 mg | INHALATION_SOLUTION | RESPIRATORY_TRACT | Status: DC | PRN

## 2023-02-14 MED ORDER — ROSUVASTATIN CALCIUM 20 MG PO TABS
40.0000 mg | ORAL_TABLET | Freq: Every day | ORAL | Status: DC
  Administered 2023-02-14 – 2023-02-16 (×3): 40 mg via ORAL
  Filled 2023-02-14 (×3): qty 2

## 2023-02-14 MED ORDER — SODIUM CHLORIDE 0.9 % IV SOLN
1.0000 g | INTRAVENOUS | Status: DC
  Administered 2023-02-14: 1 g via INTRAVENOUS
  Filled 2023-02-14: qty 10

## 2023-02-14 MED ORDER — PREDNISONE 20 MG PO TABS
40.0000 mg | ORAL_TABLET | Freq: Every day | ORAL | Status: DC
  Administered 2023-02-15 – 2023-02-16 (×2): 40 mg via ORAL
  Filled 2023-02-14 (×2): qty 2

## 2023-02-14 MED ORDER — METHYLPREDNISOLONE SODIUM SUCC 125 MG IJ SOLR
125.0000 mg | Freq: Two times a day (BID) | INTRAMUSCULAR | Status: AC
  Administered 2023-02-14 (×2): 125 mg via INTRAVENOUS
  Filled 2023-02-14 (×2): qty 2

## 2023-02-14 MED ORDER — ONDANSETRON HCL 4 MG PO TABS: 4.0000 mg | ORAL_TABLET | Freq: Four times a day (QID) | ORAL | Status: DC | PRN

## 2023-02-14 MED ORDER — FUROSEMIDE 40 MG PO TABS
40.0000 mg | ORAL_TABLET | Freq: Every day | ORAL | Status: DC
  Administered 2023-02-15 – 2023-02-16 (×2): 40 mg via ORAL
  Filled 2023-02-14 (×2): qty 1

## 2023-02-14 MED ORDER — IPRATROPIUM-ALBUTEROL 0.5-2.5 (3) MG/3ML IN SOLN
3.0000 mL | Freq: Three times a day (TID) | RESPIRATORY_TRACT | Status: DC
  Administered 2023-02-14 – 2023-02-15 (×5): 3 mL via RESPIRATORY_TRACT
  Filled 2023-02-14 (×5): qty 3

## 2023-02-14 MED ORDER — SODIUM CHLORIDE 0.9% FLUSH
3.0000 mL | Freq: Two times a day (BID) | INTRAVENOUS | Status: DC
  Administered 2023-02-14 – 2023-02-16 (×5): 3 mL via INTRAVENOUS

## 2023-02-14 MED ORDER — SENNOSIDES-DOCUSATE SODIUM 8.6-50 MG PO TABS: 1.0000 | ORAL_TABLET | Freq: Every evening | ORAL | Status: DC | PRN

## 2023-02-14 MED ORDER — ONDANSETRON HCL 4 MG/2ML IJ SOLN: 4.0000 mg | Freq: Four times a day (QID) | INTRAMUSCULAR | Status: DC | PRN

## 2023-02-14 MED ORDER — APIXABAN 2.5 MG PO TABS
2.5000 mg | ORAL_TABLET | Freq: Two times a day (BID) | ORAL | Status: DC
  Administered 2023-02-14 – 2023-02-16 (×5): 2.5 mg via ORAL
  Filled 2023-02-14 (×5): qty 1

## 2023-02-14 MED ORDER — ACETAMINOPHEN 650 MG RE SUPP: 650.0000 mg | Freq: Four times a day (QID) | RECTAL | Status: DC | PRN

## 2023-02-14 NOTE — ED Notes (Signed)
Pt states when she coughs a lot she has a small amt of urine that comes out. Asked for a pad and was given a couple.

## 2023-02-14 NOTE — Assessment & Plan Note (Signed)
Class 1 BMI 31

## 2023-02-14 NOTE — Progress Notes (Addendum)
    Patient: Courtney Cline FMW:[id number] DOB: [birth year]      Brief hospital course: 89 y.o. F with COPD FEV1 31% in 2019, chronic respiratory failure on 4L at home, pAF on Eliquis , HFpEF, CKD IIIb baseline 1.2-1.5 and anemia of CKD who presented with SOB, productive cough and wheezing.  In the ER, CXR showed atelectasis only.  She was started on steroids, antibiotics, bronchodilators and admitted.  This is a no charge note, for further details, please see the H&P by my partner, Dr. Charlton from earlier today.     Principal Problem:   COPD exacerbation (HCC) Active Problems:   Chronic hypoxemic respiratory failure (HCC)   Persistent atrial fibrillation (HCC)   CKD stage 3b, GFR 30-44 ml/min (HCC)   Chronic heart failure with preserved ejection fraction (HFpEF) (HCC)   Obesity (BMI 30-39.9)   Normocytic anemia   Myocardial injury    Continue steroids, antibiotics, bronchodilators PT eval Wean to home O2 with exertion and possibly ready for d/c soon Takes Lasix  40 once daily, so this was changed.      Physical Exam: BP (!) 126/52 (BP Location: Right Arm)   Pulse 79   Temp 98.1 F (36.7 C)   Resp 18   Ht 5' (1.524 m)   Wt 72.6 kg   SpO2 99%   BMI 31.25 kg/m   Patient seen and examined.     Family Communication: Son at bedside        Author: Lonni SHAUNNA Dalton, MD 02/14/2023 5:51 PM

## 2023-02-14 NOTE — H&P (Signed)
 History and Physical    Courtney Cline FMW:[id number] DOB: 10-03-1933 DOA: 02/13/2023  PCP: Elliot Charm, MD   Patient coming from: Home   Chief Complaint: SOB, cough, wheezing   HPI: Courtney Cline is a 89 y.o. female with medical history significant for COPD, chronic hypoxic respiratory failure, atrial fibrillation on Eliquis , chronic HFpEF, CKD 3B, and recent COVID-19 infection who presents with shortness of breath, cough, and wheezing.  Patient reports that she had COVID-19 infection beginning around 01/19/2023, completed a course of molnupiravir, and her symptoms resolved.  Over the past week however, she has developed a productive cough, wheezing, and shortness of breath.  Symptoms improved momentarily with breathing treatments at home.  She denies fevers, chills, rhinorrhea, sore throat, or change in her chronic mild lower extremity edema.  ED Course: Upon arrival to the ED, patient is found to be afebrile and saturating well on 4 L/min of supplemental oxygen  with mild tachypnea, tachycardia, and stable blood pressure.  EKG demonstrates atrial flutter with rate 149.  Chest x-ray notable for mild right basilar atelectasis.  Labs are most remarkable for creatinine 1.32, albumin 2.9, normal WBC, and BNP 361.  Patient was treated with antibiotics in the ED.  Review of Systems:  All other systems reviewed and apart from HPI, are negative.  Past Medical History:  Diagnosis Date   Allergic rhinitis    Arthritis    Breast calcification seen on mammogram    Right breast   Breast cancer (HCC)    COPD (chronic obstructive pulmonary disease) (HCC)    GERD (gastroesophageal reflux disease)    Hyperglycemia    Hyperlipidemia    Hypertension    Low back pain    On home oxygen  therapy    all the time   Osteopenia    Peripheral edema    Shortness of breath    Subclavian artery stenosis, left (HCC)    Vitamin D  deficiency    Wears dentures    top   Wears  glasses    reading    Past Surgical History:  Procedure Laterality Date   BREAST BIOPSY     BREAST LUMPECTOMY     right 2015   BREAST LUMPECTOMY WITH RADIOACTIVE SEED LOCALIZATION Right 12/20/2013   Procedure: RIGHT BREAST LUMPECTOMY WITH RADIOACTIVE SEED LOCALIZATION;  Surgeon: Debby Shipper, MD;  Location: [facility];  Service: General;  Laterality: Right;   CAROTID DUPLEX  2014   CATARACT EXTRACTION     both eyes    Social History:   reports that she quit smoking about 29 years ago. Her smoking use included cigarettes. She started smoking about 69 years ago. She has a 40 pack-year smoking history. She has never used smokeless tobacco. She reports that she does not drink alcohol  and does not use drugs.  No Known Allergies  Family History  Problem Relation Age of Onset   Emphysema Mother    Emphysema Maternal Grandfather      Prior to Admission medications   Medication Sig Start Date End Date Taking? Authorizing Provider  acetaminophen  (TYLENOL ) 325 MG tablet Take 650 mg by mouth every 6 (six) hours as needed for mild pain or headache.     [provider]  albuterol  (PROVENTIL ) (2.5 MG/3ML) 0.083% nebulizer solution Take 2.5 mg by nebulization every 6 (six) hours as needed for wheezing or shortness of breath. 01/02/22   [provider]  albuterol  (VENTOLIN  HFA) 108 (90 Base) MCG/ACT inhaler Inhale 2 puffs into the  lungs every 6 (six) hours as needed for wheezing or shortness of breath. 05/03/22   Kassie Acquanetta Bradley, MD  apixaban  (ELIQUIS ) 2.5 MG TABS tablet Take 1 tablet (2.5 mg total) by mouth 2 (two) times daily. 04/05/22   Court Dorn PARAS, MD  Cholecalciferol  (VITAMIN D ) 2000 UNITS CAPS Take 2,000 Units by mouth daily.     [provider]  Cyanocobalamin  (VITAMIN B 12 PO) Take 1 tablet by mouth daily.    [provider]  furosemide  (LASIX ) 40 MG tablet Take 0.5 tablets (20 mg total) by mouth 2 (two) times daily. <PLEASE MAKE  APPOINTMENT FOR REFILLS> 02/19/21 04/12/22  Vernon Ranks, MD  metoprolol  tartrate (LOPRESSOR ) 25 MG tablet Take 1 tablet (25 mg total) by mouth 2 (two) times daily. 07/17/19   Gretta Doffing P, DO  PROAIR  HFA 108 (90 Base) MCG/ACT inhaler TAKE 2 PUFFS BY MOUTH EVERY 6 HOURS AS NEEDED FOR WHEEZE OR SHORTNESS OF BREATH Patient taking differently: Inhale 2 puffs into the lungs every 6 (six) hours as needed for wheezing or shortness of breath. 08/16/17   McQuaid, Douglas B, MD  rosuvastatin  (CRESTOR ) 40 MG tablet Take 40 mg by mouth daily. 02/02/21   [provider]  TRELEGY ELLIPTA  200-62.5-25 MCG/ACT AEPB INHALE ONE PUFF into lungs DAILY 11/04/22   Kassie Acquanetta Bradley, MD    Physical Exam: Vitals:   02/13/23 2145 02/13/23 2301 02/14/23 0157 02/14/23 0158  BP: (!) 155/53  (!) 131/52   Pulse: (!) 103 95 81   Resp: 18 (!) 21 18   Temp: 99.5 F (37.5 C)   99.1 F (37.3 C)  TempSrc: Oral   Oral  SpO2: 99% 100% 100%   Weight:  72.6 kg    Height:  5' (1.524 m)      Constitutional: NAD, calm  Eyes: PERTLA, lids and conjunctivae normal ENMT: Mucous membranes are moist. Posterior pharynx clear of any exudate or lesions.   Neck: supple, no masses  Respiratory: Markedly diminished breath sounds bilaterally with prolonged expiratory phase. Speaking full sentences.  Cardiovascular: S1 & S2 heard, regular rate and rhythm. Trace lower extremity edema.  Abdomen: No distension, no tenderness, soft. Bowel sounds active.  Musculoskeletal: no clubbing / cyanosis. No joint deformity upper and lower extremities.   Skin: no significant rashes, lesions, ulcers. Warm, dry, well-perfused. Neurologic: CN 2-12 grossly intact. Moving all extremities. Alert and oriented.  Psychiatric: Pleasant. Cooperative.    Labs and Imaging on Admission: I have personally reviewed following labs and imaging studies  CBC: Recent Labs  Lab 02/13/23 2158  WBC 9.8  NEUTROABS 8.5*  HGB 9.6*  HCT 31.4*  MCV 96.3  PLT 166    Basic Metabolic Panel: Recent Labs  Lab 02/13/23 2158  NA 137  K 3.8  CL 100  CO2 25  GLUCOSE 133*  BUN 18  CREATININE 1.32*  CALCIUM  8.8*   GFR: Estimated Creatinine Clearance: 26.7 mL/min (A) (by C-G formula based on SCr of 1.32 mg/dL (H)). Liver Function Tests: Recent Labs  Lab 02/13/23 2158  AST 17  ALT 9  ALKPHOS 60  BILITOT 0.6  PROT 6.7  ALBUMIN 2.9*   No results for input(s): LIPASE, AMYLASE in the last 168 hours. No results for input(s): AMMONIA in the last 168 hours. Coagulation Profile: No results for input(s): INR, PROTIME in the last 168 hours. Cardiac Enzymes: No results for input(s): CKTOTAL, CKMB, CKMBINDEX, TROPONINI in the last 168 hours. BNP (last 3 results) No results for input(s): PROBNP  in the last 8760 hours. HbA1C: No results for input(s): HGBA1C in the last 72 hours. CBG: No results for input(s): GLUCAP in the last 168 hours. Lipid Profile: No results for input(s): CHOL, HDL, LDLCALC, TRIG, CHOLHDL, LDLDIRECT in the last 72 hours. Thyroid  Function Tests: No results for input(s): TSH, T4TOTAL, FREET4, T3FREE, THYROIDAB in the last 72 hours. Anemia Panel: No results for input(s): VITAMINB12, FOLATE, FERRITIN, TIBC, IRON , RETICCTPCT in the last 72 hours. Urine analysis:    Component Value Date/Time   COLORURINE YELLOW 01/03/2022 0300   APPEARANCEUR HAZY (A) 01/03/2022 0300   LABSPEC 1.018 01/03/2022 0300   PHURINE 6.0 01/03/2022 0300   GLUCOSEU NEGATIVE 01/03/2022 0300   HGBUR MODERATE (A) 01/03/2022 0300   BILIRUBINUR NEGATIVE 01/03/2022 0300   KETONESUR NEGATIVE 01/03/2022 0300   PROTEINUR 30 (A) 01/03/2022 0300   NITRITE NEGATIVE 01/03/2022 0300   LEUKOCYTESUR MODERATE (A) 01/03/2022 0300   Sepsis Labs: @LABRCNTIP (procalcitonin:4,lacticidven:4) )No results found for this or any previous visit (from the past 240 hours).   Radiological Exams on Admission: DG Chest  Portable 1 View Result Date: 02/13/2023 CLINICAL DATA:  Shortness of breath and COVID positivity EXAM: PORTABLE CHEST 1 VIEW COMPARISON:  05/03/2018 FINDINGS: Cardiac shadow is mildly prominent but accentuated by the portable technique. Lungs are well aerated bilaterally. Mild right basilar atelectasis is seen. No focal confluent infiltrate is noted. No bony abnormality is seen. IMPRESSION: Mild right basilar atelectasis. Electronically Signed   By: Oneil Devonshire M.D.   On: 02/13/2023 23:48    EKG: Independently reviewed. Atrial flutter, rate 149.   Assessment/Plan   1. COPD exacerbation; chronic hypoxic respiratory failure  - Culture sputum, continue Rocephin , start systemic steroid, schedule DuoNebs, use additional albuterol  as needed, continue supplemental O2    2. Atrial fibrillation  - Continue Eliquis  and metoprolol     3. Chronic HFpEF  - Appears compensated  - Continue Lasix     4. CKD 3B  - Appears close to baseline  - Renally-dose medications    5. Recent COVID-19 infection  - Patient was treated for COVID-19 more than 3 wks ago, symptoms resolved, she should not be re-tested now, and she no longer requires isolation    DVT prophylaxis: Eliquis   Code Status: DNR Level of Care: Level of care: Telemetry Medical Family Communication: none present  Disposition Plan:  Patient is from: Home  Anticipated d/c is to: TBD Anticipated d/c date is: 02/16/23  Patient currently: Pending improved respiratory status  Consults called: None  Admission status: Inpatient     Evalene GORMAN Sprinkles, MD [facility]  02/14/2023, 2:27 AM

## 2023-02-14 NOTE — Care Management Obs Status (Signed)
 MEDICARE OBSERVATION STATUS NOTIFICATION   Patient Details  Name: Courtney Cline MRN: [id number] Date of Birth: September 27, 1933   Medicare Observation Status Notification Given:  Waylan Boga, RN 02/14/2023, 5:30 PM

## 2023-02-14 NOTE — Care Management CC44 (Signed)
 Condition Code 44 Documentation Completed  Patient Details  Name: Courtney Cline MRN: [id number] Date of Birth: [birth year]   Condition Code 44 given:  Yes Patient signature on Condition Code 44 notice:  Yes Documentation of 2 MD's agreement:  Yes Code 44 added to claim:  Yes    Leba Tibbitts, RN 02/14/2023, 5:30 PM

## 2023-02-14 NOTE — Plan of Care (Signed)

## 2023-02-14 NOTE — Hospital Course (Addendum)
 89 y.o. F with COPD FEV1 31% in 2019, chronic respiratory failure on 4L at home, pAF on Eliquis , HFpEF, CKD IIIb baseline 1.2-1.5 and anemia of CKD who presented with SOB, productive cough and wheezing.  In the ER, CXR showed atelectasis only.  She was started on steroids, antibiotics, bronchodilators and admitted.

## 2023-02-14 NOTE — ED Notes (Signed)
 ED TO INPATIENT HANDOFF REPORT  ED Nurse Name and Phone #: Leita [phone]  D Name/Age/Gender Courtney Cline 89 y.o. female Room/Bed: 011C/011C  Code Status   Code Status: Limited: Do not attempt resuscitation (DNR) -DNR-LIMITED -Do Not Intubate/DNI   Home/SNF/Other Home Patient oriented to: self, place, time, and situation Is this baseline? Yes   Triage Complete: Triage complete  Chief Complaint COPD exacerbation (HCC) [J44.1]  Triage Note PT BIB [facility] from urgent care. Reports pt tested positive for covid a month ago and today.  Hx copd 4L [place] baseline, hx Afib- reports pt was 140-170's at urgent care.   Didn't take any meds including antiarrythmic meds or thinner today  d/t not feeling well  [facility] VS 154/61, HR 104 a flutter, 97% 4L [place], R 18, gcs 15   Allergies No Known Allergies  Level of Care/Admitting Diagnosis ED Disposition     ED Disposition  Admit   Condition  --   Comment  Hospital Area: [facility] [[id number]]  Level of Care: Telemetry Medical [104]  May admit patient to [facility] or [facility] if equivalent level of care is available:: Yes  Covid Evaluation: Recent COVID positive no isolation required infection day 21-90  Diagnosis: COPD exacerbation [facility]) [[id number]]  Admitting Physician: CHARLTON EVALENE RAMAN [[id number]]  Attending Physician: CHARLTON EVALENE RAMAN [[id number]]  Certification:: I certify this patient will need inpatient services for at least 2 midnights  Expected Medical Readiness: 02/16/2023          B Medical/Surgery History Past Medical History:  Diagnosis Date   Allergic rhinitis    Arthritis    Breast calcification seen on mammogram    Right breast   Breast cancer (HCC)    COPD (chronic obstructive pulmonary disease) (HCC)    GERD (gastroesophageal reflux disease)    Hyperglycemia    Hyperlipidemia    Hypertension    Low back pain    On home oxygen  therapy    all the time   Osteopenia    Peripheral edema     Shortness of breath    Subclavian artery stenosis, left (HCC)    Vitamin D  deficiency    Wears dentures    top   Wears glasses    reading   Past Surgical History:  Procedure Laterality Date   BREAST BIOPSY     BREAST LUMPECTOMY     right 2015   BREAST LUMPECTOMY WITH RADIOACTIVE SEED LOCALIZATION Right 12/20/2013   Procedure: RIGHT BREAST LUMPECTOMY WITH RADIOACTIVE SEED LOCALIZATION;  Surgeon: Debby Shipper, MD;  Location: [facility];  Service: General;  Laterality: Right;   CAROTID DUPLEX  2014   CATARACT EXTRACTION     both eyes     A IV Location/Drains/Wounds Patient Lines/Drains/Airways Status     Active Line/Drains/Airways     Name Placement date Placement time Site Days   Peripheral IV 02/14/23 22 G Left;Posterior Hand 02/14/23  0117  Hand  less than 1            Intake/Output Last 24 hours No intake or output data in the 24 hours ending 02/14/23 1229  Labs/Imaging Results for orders placed or performed during the hospital encounter of 02/13/23 (from the past 48 hours)  CBC with Differential     Status: Abnormal   Collection Time: 02/13/23  9:58 PM  Result Value Ref Range   WBC 9.8 4.0 - 10.5 K/uL   RBC 3.26 (L) 3.87 - 5.11 MIL/uL  Hemoglobin 9.6 (L) 12.0 - 15.0 g/dL   HCT 68.5 (L) 63.9 - 53.9 %   MCV 96.3 80.0 - 100.0 fL   MCH 29.4 26.0 - 34.0 pg   MCHC 30.6 30.0 - 36.0 g/dL   RDW 85.6 88.4 - 84.4 %   Platelets 166 150 - 400 K/uL   nRBC 0.0 0.0 - 0.2 %   Neutrophils Relative % 87 %   Neutro Abs 8.5 (H) 1.7 - 7.7 K/uL   Lymphocytes Relative 6 %   Lymphs Abs 0.6 (L) 0.7 - 4.0 K/uL   Monocytes Relative 7 %   Monocytes Absolute 0.6 0.1 - 1.0 K/uL   Eosinophils Relative 0 %   Eosinophils Absolute 0.0 0.0 - 0.5 K/uL   Basophils Relative 0 %   Basophils Absolute 0.0 0.0 - 0.1 K/uL   Immature Granulocytes 0 %   Abs Immature Granulocytes 0.03 0.00 - 0.07 K/uL    Comment: Performed at [facility], [address]  [place], [name] [address]  Comprehensive metabolic panel     Status: Abnormal   Collection Time: 02/13/23  9:58 PM  Result Value Ref Range   Sodium 137 135 - 145 mmol/L   Potassium 3.8 3.5 - 5.1 mmol/L   Chloride 100 98 - 111 mmol/L   CO2 25 22 - 32 mmol/L   Glucose, Bld 133 (H) 70 - 99 mg/dL    Comment: Glucose reference range applies only to samples taken after fasting for at least 8 hours.   BUN 18 8 - 23 mg/dL   Creatinine, Ser 8.67 (H) 0.44 - 1.00 mg/dL   Calcium  8.8 (L) 8.9 - 10.3 mg/dL   Total Protein 6.7 6.5 - 8.1 g/dL   Albumin 2.9 (L) 3.5 - 5.0 g/dL   AST 17 15 - 41 U/L   ALT 9 0 - 44 U/L   Alkaline Phosphatase 60 38 - 126 U/L   Total Bilirubin 0.6 0.0 - 1.2 mg/dL   GFR, Estimated 39 (L) >60 mL/min    Comment: (NOTE) Calculated using the CKD-EPI Creatinine Equation (2021)    Anion gap 12 5 - 15    Comment: Performed at [facility], [address] [place], [name] [address]  Brain natriuretic peptide     Status: Abnormal   Collection Time: 02/13/23  9:58 PM  Result Value Ref Range   B Natriuretic Peptide 361.3 (H) 0.0 - 100.0 pg/mL    Comment: Performed at [facility], [address], [place], [name] [address]  Troponin I (High Sensitivity)     Status: Abnormal   Collection Time: 02/13/23  9:58 PM  Result Value Ref Range   Troponin I (High Sensitivity) 28 (H) <18 ng/L    Comment: (NOTE) Elevated high sensitivity troponin I (hsTnI) values and significant  changes across serial measurements may suggest ACS but many other  chronic and acute conditions are known to elevate hsTnI results.  Refer to the Links section for chest pain algorithms and additional  guidance. Performed at [facility], [address] [place], [name] [address]   Troponin I (High Sensitivity)     Status: Abnormal   Collection Time: 02/13/23 11:38 PM  Result Value Ref Range   Troponin I (High Sensitivity) 35 (H) <18 ng/L    Comment: (NOTE) Elevated high sensitivity  troponin I (hsTnI) values and significant  changes across serial measurements may suggest ACS but many other  chronic and acute conditions are known  to elevate hsTnI results.  Refer to the Links section for chest pain algorithms and additional  guidance. Performed at [facility], [address] [place], [name] [address]   Basic metabolic panel     Status: Abnormal   Collection Time: 02/14/23  5:00 AM  Result Value Ref Range   Sodium 138 135 - 145 mmol/L   Potassium 3.9 3.5 - 5.1 mmol/L   Chloride 102 98 - 111 mmol/L   CO2 26 22 - 32 mmol/L   Glucose, Bld 124 (H) 70 - 99 mg/dL    Comment: Glucose reference range applies only to samples taken after fasting for at least 8 hours.   BUN 17 8 - 23 mg/dL   Creatinine, Ser 8.78 (H) 0.44 - 1.00 mg/dL   Calcium  8.5 (L) 8.9 - 10.3 mg/dL   GFR, Estimated 43 (L) >60 mL/min    Comment: (NOTE) Calculated using the CKD-EPI Creatinine Equation (2021)    Anion gap 10 5 - 15    Comment: Performed at [facility] Lab, [address] [place], [name] [address]  Magnesium     Status: None   Collection Time: 02/14/23  5:00 AM  Result Value Ref Range   Magnesium 2.2 1.7 - 2.4 mg/dL    Comment: Performed at [facility], [address], [place], [name] [address]  CBC     Status: Abnormal   Collection Time: 02/14/23  5:00 AM  Result Value Ref Range   WBC 6.6 4.0 - 10.5 K/uL   RBC 2.92 (L) 3.87 - 5.11 MIL/uL   Hemoglobin 8.9 (L) 12.0 - 15.0 g/dL   HCT 71.6 (L) 63.9 - 53.9 %   MCV 96.9 80.0 - 100.0 fL   MCH 30.5 26.0 - 34.0 pg   MCHC 31.4 30.0 - 36.0 g/dL   RDW 85.5 88.4 - 84.4 %   Platelets 156 150 - 400 K/uL   nRBC 0.0 0.0 - 0.2 %    Comment: Performed at [facility] Lab, [address] [place], [name] [address]   DG Chest Portable 1 View Result Date: 02/13/2023 CLINICAL DATA:  Shortness of breath and COVID positivity EXAM: PORTABLE CHEST 1 VIEW COMPARISON:  05/03/2018 FINDINGS: Cardiac shadow is mildly prominent but  accentuated by the portable technique. Lungs are well aerated bilaterally. Mild right basilar atelectasis is seen. No focal confluent infiltrate is noted. No bony abnormality is seen. IMPRESSION: Mild right basilar atelectasis. Electronically Signed   By: Oneil Devonshire M.D.   On: 02/13/2023 23:48    Pending Labs Unresulted Labs (From admission, onward)     Start     Ordered   02/14/23 0500  Basic metabolic panel  Daily,   R      02/14/23 0227   02/14/23 0500  CBC  Daily,   R      02/14/23 0227   02/14/23 0226  Expectorated Sputum Assessment w Gram Stain, Rflx to Resp Cult  (COPD / Pneumonia / Cellulitis / Lower Extremity Wound)  Once,   R        02/14/23 0227            Vitals/Pain Today's Vitals   02/14/23 0547 02/14/23 0600 02/14/23 0613 02/14/23 0933  BP: (!) 125/47   126/84  Pulse: 81   95  Resp: 16   18  Temp:  98.4 F (36.9 C)  97.8 F (36.6 C)  TempSrc:  Oral  Oral  SpO2: 99%  99%  Weight:      Height:      PainSc:   0-No pain     Isolation Precautions No active isolations  Medications Medications  furosemide  (LASIX ) tablet 20 mg (20 mg Oral Given 02/14/23 0809)  metoprolol  tartrate (LOPRESSOR ) tablet 25 mg (25 mg Oral Given 02/14/23 0934)  rosuvastatin  (CRESTOR ) tablet 40 mg (40 mg Oral Given 02/14/23 0934)  apixaban  (ELIQUIS ) tablet 2.5 mg (2.5 mg Oral Given 02/14/23 0934)  cefTRIAXone  (ROCEPHIN ) 1 g in sodium chloride  0.9 % 100 mL IVPB (has no administration in time range)  methylPREDNISolone  sodium succinate (SOLU-MEDROL ) 125 mg/2 mL injection 125 mg (125 mg Intravenous Given 02/14/23 0305)    Followed by  predniSONE  (DELTASONE ) tablet 40 mg (has no administration in time range)  ipratropium-albuterol  (DUONEB) 0.5-2.5 (3) MG/3ML nebulizer solution 3 mL (3 mLs Nebulization Given 02/14/23 0810)  albuterol  (PROVENTIL ) (2.5 MG/3ML) 0.083% nebulizer solution 2.5 mg (has no administration in time range)  sodium chloride  flush (NS) 0.9 % injection 3 mL (3 mLs  Intravenous Given 02/14/23 0946)  acetaminophen  (TYLENOL ) tablet 650 mg (has no administration in time range)    Or  acetaminophen  (TYLENOL ) suppository 650 mg (has no administration in time range)  senna-docusate (Senokot-S) tablet 1 tablet (has no administration in time range)  ondansetron  (ZOFRAN ) tablet 4 mg (has no administration in time range)    Or  ondansetron  (ZOFRAN ) injection 4 mg (has no administration in time range)  cefTRIAXone  (ROCEPHIN ) 2 g in sodium chloride  0.9 % 100 mL IVPB (0 g Intravenous Stopped 02/14/23 0256)  azithromycin  (ZITHROMAX ) 500 mg in sodium chloride  0.9 % 250 mL IVPB (0 mg Intravenous Stopped 02/14/23 0612)    Mobility walks with person assist     Focused Assessments Pulmonary Assessment Handoff:  Lung sounds: Bilateral Breath Sounds: Rhonchi L Breath Sounds: Rhonchi R Breath Sounds: Rhonchi O2 Device: Nasal Cannula O2 Flow Rate (L/min): 4 L/min    R Recommendations: See Admitting Provider Note  Report given to:   Additional Notes: Bedside commode

## 2023-02-14 NOTE — ED Provider Notes (Signed)
 [facility]-EMERGENCY DEPT [facility] Emergency Department Provider Note MRN:  [id number]  Arrival date & time: 02/14/23     Chief Complaint   Shortness of Breath   History of Present Illness   Courtney Cline is a 89 y.o. year-old female with a history of COPD, breast cancer presenting to the ED with chief complaint of shortness of breath.  Diagnosed with COVID 1 or 2 weeks ago, felt like she was getting better, then almost a week ago began feeling worse again.  Increased cough, shortness of breath.  Denies chest pain, no fever.  No abdominal pain, no new leg pain or swelling.  Taking her anticoagulation as prescribed.  Review of Systems  A thorough review of systems was obtained and all systems are negative except as noted in the HPI and PMH.   Patient's Health History    Past Medical History:  Diagnosis Date   Allergic rhinitis    Arthritis    Breast calcification seen on mammogram    Right breast   Breast cancer (HCC)    COPD (chronic obstructive pulmonary disease) (HCC)    GERD (gastroesophageal reflux disease)    Hyperglycemia    Hyperlipidemia    Hypertension    Low back pain    On home oxygen  therapy    all the time   Osteopenia    Peripheral edema    Shortness of breath    Subclavian artery stenosis, left (HCC)    Vitamin D  deficiency    Wears dentures    top   Wears glasses    reading    Past Surgical History:  Procedure Laterality Date   BREAST BIOPSY     BREAST LUMPECTOMY     right 2015   BREAST LUMPECTOMY WITH RADIOACTIVE SEED LOCALIZATION Right 12/20/2013   Procedure: RIGHT BREAST LUMPECTOMY WITH RADIOACTIVE SEED LOCALIZATION;  Surgeon: Debby Shipper, MD;  Location: [facility];  Service: General;  Laterality: Right;   CAROTID DUPLEX  2014   CATARACT EXTRACTION     both eyes    Family History  Problem Relation Age of Onset   Emphysema Mother    Emphysema Maternal Grandfather     Social History   Socioeconomic History    Marital status: Married    Spouse name: [name]   Number of children: 2   Years of education: Not on file   Highest education level: Not on file  Occupational History   Occupation: retired    Comment: Programmer, [employer]  Tobacco Use   Smoking status: Former    Current packs/day: 0.00    Average packs/day: 1 pack/day for 40.0 years (40.0 ttl pk-yrs)    Types: Cigarettes    Start date: 02/14/1954    Quit date: 02/14/1994    Years since quitting: 29.0   Smokeless tobacco: Never  Vaping Use   Vaping status: Never Used  Substance and Sexual Activity   Alcohol  use: No   Drug use: No   Sexual activity: Not on file  Other Topics Concern   Not on file  Social History Narrative   Not on file   Social Drivers of Health   Financial Resource Strain: Not on file  Food Insecurity: No Food Insecurity (01/03/2022)   Hunger Vital Sign    Worried About Running Out of Food in the Last Year: Never true    Ran Out of Food in the Last Year: Never true  Transportation Needs: No Transportation Needs (01/03/2022)   PRAPARE - Transportation  Lack of Transportation (Medical): No    Lack of Transportation (Non-Medical): No  Physical Activity: Not on file  Stress: Not on file  Social Connections: Not on file  Intimate Partner Violence: Not At Risk (01/03/2022)   Humiliation, Afraid, Rape, and Kick questionnaire    Fear of Current or Ex-Partner: No    Emotionally Abused: No    Physically Abused: No    Sexually Abused: No     Physical Exam   Vitals:   02/14/23 0547 02/14/23 0600  BP: (!) 125/47   Pulse: 81   Resp: 16   Temp:  98.4 F (36.9 C)  SpO2: 99%     CONSTITUTIONAL: Chronically ill-appearing, NAD NEURO/PSYCH:  Alert and oriented x 3, no focal deficits EYES:  eyes equal and reactive ENT/NECK:  no LAD, no JVD CARDIO: Regular rate, well-perfused, normal S1 and S2 PULM:  CTAB no wheezing or rhonchi GI/GU:  non-distended, non-tender MSK/SPINE:  No gross deformities, no edema SKIN:  no  rash, atraumatic   *Additional and/or pertinent findings included in MDM below  Diagnostic and Interventional Summary    EKG Interpretation Date/Time:  Monday February 13 2023 22:00:49 EST Ventricular Rate:  93 PR Interval:  134 QRS Duration:  77 QT Interval:  326 QTC Calculation: 406 R Axis:   18  Text Interpretation: Sinus rhythm Confirmed by Ruthe Cornet [phone]) on 02/13/2023 10:02:59 PM       Labs Reviewed  CBC WITH DIFFERENTIAL/PLATELET - Abnormal; Notable for the following components:      Result Value   RBC 3.26 (*)    Hemoglobin 9.6 (*)    HCT 31.4 (*)    Neutro Abs 8.5 (*)    Lymphs Abs 0.6 (*)    All other components within normal limits  COMPREHENSIVE METABOLIC PANEL - Abnormal; Notable for the following components:   Glucose, Bld 133 (*)    Creatinine, Ser 1.32 (*)    Calcium  8.8 (*)    Albumin 2.9 (*)    GFR, Estimated 39 (*)    All other components within normal limits  BRAIN NATRIURETIC PEPTIDE - Abnormal; Notable for the following components:   B Natriuretic Peptide 361.3 (*)    All other components within normal limits  BASIC METABOLIC PANEL - Abnormal; Notable for the following components:   Glucose, Bld 124 (*)    Creatinine, Ser 1.21 (*)    Calcium  8.5 (*)    GFR, Estimated 43 (*)    All other components within normal limits  CBC - Abnormal; Notable for the following components:   RBC 2.92 (*)    Hemoglobin 8.9 (*)    HCT 28.3 (*)    All other components within normal limits  TROPONIN I (HIGH SENSITIVITY) - Abnormal; Notable for the following components:   Troponin I (High Sensitivity) 28 (*)    All other components within normal limits  TROPONIN I (HIGH SENSITIVITY) - Abnormal; Notable for the following components:   Troponin I (High Sensitivity) 35 (*)    All other components within normal limits  EXPECTORATED SPUTUM ASSESSMENT W GRAM STAIN, RFLX TO RESP C  MAGNESIUM    DG Chest Portable 1 View  Final Result      Medications   furosemide  (LASIX ) tablet 20 mg (has no administration in time range)  metoprolol  tartrate (LOPRESSOR ) tablet 25 mg (has no administration in time range)  rosuvastatin  (CRESTOR ) tablet 40 mg (has no administration in time range)  apixaban  (ELIQUIS ) tablet 2.5 mg (has no administration  in time range)  cefTRIAXone  (ROCEPHIN ) 1 g in sodium chloride  0.9 % 100 mL IVPB (has no administration in time range)  methylPREDNISolone  sodium succinate (SOLU-MEDROL ) 125 mg/2 mL injection 125 mg (125 mg Intravenous Given 02/14/23 0305)    Followed by  predniSONE  (DELTASONE ) tablet 40 mg (has no administration in time range)  ipratropium-albuterol  (DUONEB) 0.5-2.5 (3) MG/3ML nebulizer solution 3 mL (3 mLs Nebulization Given 02/14/23 0304)  albuterol  (PROVENTIL ) (2.5 MG/3ML) 0.083% nebulizer solution 2.5 mg (has no administration in time range)  sodium chloride  flush (NS) 0.9 % injection 3 mL (3 mLs Intravenous Not Given 02/14/23 0314)  acetaminophen  (TYLENOL ) tablet 650 mg (has no administration in time range)    Or  acetaminophen  (TYLENOL ) suppository 650 mg (has no administration in time range)  senna-docusate (Senokot-S) tablet 1 tablet (has no administration in time range)  ondansetron  (ZOFRAN ) tablet 4 mg (has no administration in time range)    Or  ondansetron  (ZOFRAN ) injection 4 mg (has no administration in time range)  cefTRIAXone  (ROCEPHIN ) 2 g in sodium chloride  0.9 % 100 mL IVPB (0 g Intravenous Stopped 02/14/23 0256)  azithromycin  (ZITHROMAX ) 500 mg in sodium chloride  0.9 % 250 mL IVPB (0 mg Intravenous Stopped 02/14/23 0612)     Procedures  /  Critical Care Procedures  ED Course and Medical Decision Making  Initial Impression and Ddx Differential diagnosis includes secondary bacterial pneumonia, long COVID, COPD exacerbation, pneumothorax.  Overall doubt PE.  No chest pain, doubt ACS.  Seems to be feeling better, does not have increased work of breathing, on her baseline 4 L of nasal cannula  oxygen .  Past medical/surgical history that increases complexity of ED encounter: COPD  Interpretation of Diagnostics I personally reviewed the EKG and my interpretation is as follows: Sinus rhythm  Labs reveal minimally elevated troponin  Patient Reassessment and Ultimate Disposition/Management     Patient continues to feel generally unwell, given her chronic O2 use and comorbidities appropriate for admission, empiric treatment for community-acquired pneumonia.  Patient management required discussion with the following services or consulting groups:  Hospitalist Service  Complexity of Problems Addressed Acute illness or injury that poses threat of life of bodily function  Additional Data Reviewed and Analyzed Further history obtained from: Further history from spouse/family member  Additional Factors Impacting ED Encounter Risk Consideration of hospitalization  Ozell HERO. Theadore, MD [facility] Emergency Medicine [facility] [email or web address]  Final Clinical Impressions(s) / ED Diagnoses     ICD-10-CM   1. Community acquired pneumonia, unspecified laterality  J18.9       ED Discharge Orders     None        Discharge Instructions Discussed with and Provided to Patient:   Discharge Instructions   None      Theadore Ozell HERO, MD 02/14/23 [phone]

## 2023-02-15 DIAGNOSIS — I130 Hypertensive heart and chronic kidney disease with heart failure and stage 1 through stage 4 chronic kidney disease, or unspecified chronic kidney disease: Secondary | ICD-10-CM | POA: Diagnosis not present

## 2023-02-15 DIAGNOSIS — J441 Chronic obstructive pulmonary disease with (acute) exacerbation: Secondary | ICD-10-CM | POA: Diagnosis not present

## 2023-02-15 DIAGNOSIS — Z87891 Personal history of nicotine dependence: Secondary | ICD-10-CM | POA: Diagnosis not present

## 2023-02-15 DIAGNOSIS — Z8616 Personal history of COVID-19: Secondary | ICD-10-CM | POA: Diagnosis not present

## 2023-02-15 DIAGNOSIS — E785 Hyperlipidemia, unspecified: Secondary | ICD-10-CM | POA: Diagnosis present

## 2023-02-15 DIAGNOSIS — D649 Anemia, unspecified: Secondary | ICD-10-CM | POA: Diagnosis not present

## 2023-02-15 DIAGNOSIS — Z79899 Other long term (current) drug therapy: Secondary | ICD-10-CM | POA: Diagnosis not present

## 2023-02-15 DIAGNOSIS — J9611 Chronic respiratory failure with hypoxia: Secondary | ICD-10-CM | POA: Diagnosis not present

## 2023-02-15 DIAGNOSIS — Z7901 Long term (current) use of anticoagulants: Secondary | ICD-10-CM | POA: Diagnosis not present

## 2023-02-15 DIAGNOSIS — Z825 Family history of asthma and other chronic lower respiratory diseases: Secondary | ICD-10-CM | POA: Diagnosis not present

## 2023-02-15 DIAGNOSIS — K219 Gastro-esophageal reflux disease without esophagitis: Secondary | ICD-10-CM | POA: Diagnosis present

## 2023-02-15 DIAGNOSIS — Z9981 Dependence on supplemental oxygen: Secondary | ICD-10-CM | POA: Diagnosis not present

## 2023-02-15 DIAGNOSIS — I5042 Chronic combined systolic (congestive) and diastolic (congestive) heart failure: Secondary | ICD-10-CM | POA: Diagnosis present

## 2023-02-15 DIAGNOSIS — E669 Obesity, unspecified: Secondary | ICD-10-CM | POA: Diagnosis not present

## 2023-02-15 DIAGNOSIS — I4892 Unspecified atrial flutter: Secondary | ICD-10-CM | POA: Diagnosis present

## 2023-02-15 DIAGNOSIS — I5A Non-ischemic myocardial injury (non-traumatic): Secondary | ICD-10-CM | POA: Diagnosis not present

## 2023-02-15 DIAGNOSIS — N1832 Chronic kidney disease, stage 3b: Secondary | ICD-10-CM | POA: Diagnosis not present

## 2023-02-15 DIAGNOSIS — I4819 Other persistent atrial fibrillation: Secondary | ICD-10-CM | POA: Diagnosis not present

## 2023-02-15 DIAGNOSIS — Z7951 Long term (current) use of inhaled steroids: Secondary | ICD-10-CM | POA: Diagnosis not present

## 2023-02-15 DIAGNOSIS — Z6831 Body mass index (BMI) 31.0-31.9, adult: Secondary | ICD-10-CM | POA: Diagnosis not present

## 2023-02-15 DIAGNOSIS — D631 Anemia in chronic kidney disease: Secondary | ICD-10-CM | POA: Diagnosis not present

## 2023-02-15 DIAGNOSIS — I5032 Chronic diastolic (congestive) heart failure: Secondary | ICD-10-CM | POA: Diagnosis not present

## 2023-02-15 DIAGNOSIS — M8580 Other specified disorders of bone density and structure, unspecified site: Secondary | ICD-10-CM | POA: Diagnosis present

## 2023-02-15 DIAGNOSIS — Z853 Personal history of malignant neoplasm of breast: Secondary | ICD-10-CM | POA: Diagnosis not present

## 2023-02-15 DIAGNOSIS — J9811 Atelectasis: Secondary | ICD-10-CM | POA: Diagnosis present

## 2023-02-15 DIAGNOSIS — Z66 Do not resuscitate: Secondary | ICD-10-CM | POA: Diagnosis not present

## 2023-02-15 LAB — BASIC METABOLIC PANEL
Anion gap: 8 (ref 5–15)
BUN: 25 mg/dL — ABNORMAL HIGH (ref 8–23)
CO2: 29 mmol/L (ref 22–32)
Calcium: 9.1 mg/dL (ref 8.9–10.3)
Chloride: 105 mmol/L (ref 98–111)
Creatinine, Ser: 1.25 mg/dL — ABNORMAL HIGH (ref 0.44–1.00)
GFR, Estimated: 42 mL/min — ABNORMAL LOW (ref >60)
Glucose, Bld: 133 mg/dL — ABNORMAL HIGH (ref 70–99)
Potassium: 4 mmol/L (ref 3.5–5.1)
Sodium: 142 mmol/L (ref 135–145)

## 2023-02-15 LAB — CBC
HCT: 28.7 % — ABNORMAL LOW (ref 36.0–46.0)
Hemoglobin: 8.9 g/dL — ABNORMAL LOW (ref 12.0–15.0)
MCH: 29.5 pg (ref 26.0–34.0)
MCHC: 31 g/dL (ref 30.0–36.0)
MCV: 95 fL (ref 80.0–100.0)
Platelets: 185 10*3/uL (ref 150–400)
RBC: 3.02 MIL/uL — ABNORMAL LOW (ref 3.87–5.11)
RDW: 14.2 % (ref 11.5–15.5)
WBC: 8.6 10*3/uL (ref 4.0–10.5)
nRBC: 0 % (ref 0.0–0.2)

## 2023-02-15 MED ORDER — GUAIFENESIN ER 600 MG PO TB12
1200.0000 mg | ORAL_TABLET | Freq: Two times a day (BID) | ORAL | Status: DC
  Administered 2023-02-15 – 2023-02-16 (×3): 1200 mg via ORAL
  Filled 2023-02-15 (×3): qty 2

## 2023-02-15 MED ORDER — LORATADINE 10 MG PO TABS
10.0000 mg | ORAL_TABLET | Freq: Every day | ORAL | Status: DC
Start: 2023-02-15 — End: 2023-02-16
  Administered 2023-02-15 – 2023-02-16 (×2): 10 mg via ORAL
  Filled 2023-02-15 (×2): qty 1

## 2023-02-15 MED ORDER — IPRATROPIUM-ALBUTEROL 0.5-2.5 (3) MG/3ML IN SOLN: 3.0000 mL | Freq: Three times a day (TID) | RESPIRATORY_TRACT | Status: DC

## 2023-02-15 MED ORDER — IPRATROPIUM-ALBUTEROL 0.5-2.5 (3) MG/3ML IN SOLN
3.0000 mL | Freq: Three times a day (TID) | RESPIRATORY_TRACT | Status: DC
  Administered 2023-02-15 – 2023-02-16 (×2): 3 mL via RESPIRATORY_TRACT
  Filled 2023-02-15 (×2): qty 3

## 2023-02-15 MED ORDER — FLUTICASONE PROPIONATE 50 MCG/ACT NA SUSP
2.0000 | Freq: Every day | NASAL | Status: DC
  Administered 2023-02-15 – 2023-02-16 (×2): 2 via NASAL
  Filled 2023-02-15: qty 16

## 2023-02-15 MED ORDER — PANTOPRAZOLE SODIUM 40 MG PO TBEC
40.0000 mg | DELAYED_RELEASE_TABLET | Freq: Every day | ORAL | Status: DC
  Administered 2023-02-15 – 2023-02-16 (×2): 40 mg via ORAL
  Filled 2023-02-15 (×2): qty 1

## 2023-02-15 MED ORDER — SODIUM CHLORIDE 0.9 % IV SOLN
2.0000 g | INTRAVENOUS | Status: DC
  Administered 2023-02-15: 2 g via INTRAVENOUS
  Filled 2023-02-15: qty 20

## 2023-02-15 MED ORDER — BUDESONIDE 0.5 MG/2ML IN SUSP
0.5000 mg | Freq: Two times a day (BID) | RESPIRATORY_TRACT | Status: DC
  Administered 2023-02-15 – 2023-02-16 (×3): 0.5 mg via RESPIRATORY_TRACT
  Filled 2023-02-15 (×3): qty 2

## 2023-02-15 NOTE — Evaluation (Signed)
 Physical Therapy Evaluation Patient Details Name: Courtney Cline MRN: [id number] DOB: 27-Sep-1933 Today's Date: 02/15/2023  History of Present Illness  89 y.o. female presents to [facility] hospital on 12.30/2024 with SOB, cough and wheezing. Pt admitted for COPD exacerbation and afib. PMH includes COPD, chronic hypoxic respiratory failure, afib, chronic HFpEF, CKD III.  Clinical Impression  Pt presents to PT with deficits in functional mobility, cardiopulmonary function, strength, power, endurance. Pt is able to ambulate for short household distances with support of RW. PT encourages frequent mobilization in an effort to improve strength and activity tolerance. PT recommends HHPT at the time of discharge.        If plan is discharge home, recommend the following: A little help with bathing/dressing/bathroom;Assistance with cooking/housework;Assist for transportation;Help with stairs or ramp for entrance   Can travel by private vehicle        Equipment Recommendations None recommended by PT  Recommendations for Other Services       Functional Status Assessment Patient has had a recent decline in their functional status and demonstrates the ability to make significant improvements in function in a reasonable and predictable amount of time.     Precautions / Restrictions Precautions Precautions: Fall Restrictions Weight Bearing Restrictions Per Provider Order: No      Mobility  Bed Mobility Overal bed mobility: Needs Assistance Bed Mobility: Supine to Sit     Supine to sit: Supervision          Transfers Overall transfer level: Needs assistance Equipment used: Rolling walker (2 wheels) Transfers: Sit to/from Stand Sit to Stand: Contact guard assist                Ambulation/Gait Ambulation/Gait assistance: Contact guard assist Gait Distance (Feet): 30 Feet Assistive device: Rolling walker (2 wheels) Gait Pattern/deviations: Step-through pattern Gait velocity:  reduced Gait velocity interpretation: <1.8 ft/sec, indicate of risk for recurrent falls   General Gait Details: slowed step-through gait  Stairs            Wheelchair Mobility     Tilt Bed    Modified Rankin (Stroke Patients Only)       Balance Overall balance assessment: Needs assistance Sitting-balance support: No upper extremity supported, Feet supported Sitting balance-Leahy Scale: Good     Standing balance support: Single extremity supported, Reliant on assistive device for balance Standing balance-Leahy Scale: Poor                               Pertinent Vitals/Pain Pain Assessment Pain Assessment: No/denies pain    Home Living Family/patient expects to be discharged to:: Private residence Living Arrangements: Spouse/significant other Available Help at Discharge: Family;Available 24 hours/day Type of Home: House Home Access: Stairs to enter Entrance Stairs-Rails: Can reach both Entrance Stairs-Number of Steps: 3   Home Layout: One level Home Equipment: Agricultural Consultant (2 wheels);Shower seat;Wheelchair - manual;BSC/3in1      Prior Function Prior Level of Function : Independent/Modified Independent             Mobility Comments: ambulatory without DME       Extremity/Trunk Assessment   Upper Extremity Assessment Upper Extremity Assessment: Generalized weakness    Lower Extremity Assessment Lower Extremity Assessment: Generalized weakness    Cervical / Trunk Assessment Cervical / Trunk Assessment: Kyphotic  Communication   Communication Communication: No apparent difficulties Cueing Techniques: Verbal cues  Cognition Arousal: Alert Behavior During Therapy: WFL for tasks assessed/performed Overall  Cognitive Status: Within Functional Limits for tasks assessed                                          General Comments General comments (skin integrity, edema, etc.): VSS on 4L [place]    Exercises      Assessment/Plan    PT Assessment Patient needs continued PT services  PT Problem List Decreased activity tolerance;Cardiopulmonary status limiting activity;Decreased strength;Decreased balance;Decreased mobility       PT Treatment Interventions Gait training;DME instruction;Stair training;Functional mobility training;Therapeutic activities;Therapeutic exercise;Balance training;Neuromuscular re-education;Patient/family education    PT Goals (Current goals can be found in the Care Plan section)  Acute Rehab PT Goals Patient Stated Goal: to improve activity tolerance PT Goal Formulation: With patient Time For Goal Achievement: 03/01/23 Potential to Achieve Goals: Good Additional Goals Additional Goal #1: Pt will report 1/4 DOE or less when ambulating for >100' to demonstrate improved activity tolerance    Frequency Min 1X/week     Co-evaluation               AM-PAC PT 6 Clicks Mobility  Outcome Measure Help needed turning from your back to your side while in a flat bed without using bedrails?: A Little Help needed moving from lying on your back to sitting on the side of a flat bed without using bedrails?: A Little Help needed moving to and from a bed to a chair (including a wheelchair)?: A Little Help needed standing up from a chair using your arms (e.g., wheelchair or bedside chair)?: A Little Help needed to walk in hospital room?: A Little Help needed climbing 3-5 steps with a railing? : A Little 6 Click Score: 18    End of Session Equipment Utilized During Treatment: Gait belt;Oxygen  Activity Tolerance: Patient tolerated treatment well Patient left: in chair;with call bell/phone within reach Nurse Communication: Mobility status PT Visit Diagnosis: Other abnormalities of gait and mobility (R26.89)    Time: 9141-9088 PT Time Calculation (min) (ACUTE ONLY): 13 min   Charges:   PT Evaluation $PT Eval Low Complexity: 1 Low   PT General Charges $$ ACUTE PT  VISIT: 1 Visit         Bernardino JINNY Ruth, PT, DPT Acute Rehabilitation Office [phone]   Bernardino JINNY Ruth 02/15/2023, 9:33 AM

## 2023-02-15 NOTE — Progress Notes (Addendum)
 Transition of Care [facility]) - Inpatient Brief Assessment   Patient Details  Name: Bellina Tokarczyk MRN: [id number] Date of Birth: 1933/05/21  Transition of Care [facility]) CM/SW Contact:    Rosaline JONELLE Joe, RN Phone Number: 02/15/2023, 1:38 PM   Clinical Narrative: Patient admitted with COPD exacerbation and patient was provided with [facility] letter yesterday in the ER.  Patient lives at home with spouse and is on chronic oxygen  at 4L/min [place].  Patient has DME that includes oxygen  through [employer], nebulizer, RW, WC, 3:1.  Patient quit smoking 20 years ago.  Patient was offered Medicare choice regarding home health agency she she would like to have services restarted with [facility].  Amy Hyatt, CM was contact for [facility] PT, OT - response for acceptance pending.  HH orders placed for PT/OT to be co-signed by the MD.  No other TOC needs at this time.  Patient will discharge home with medically stable by family car.  02/14/22 1432 - [facility] home health accepted for services - noted in the AVS.   Transition of Care Asessment: Insurance and Status: (P) Insurance coverage has been reviewed Patient has primary care physician: (P) Yes Home environment has been reviewed: (P) from home with spouse Prior level of function:: (P) independent Prior/Current Home Services: (P) No current home services Social Drivers of Health Review: (P) SDOH reviewed interventions complete Readmission risk has been reviewed: (P) Yes Transition of care needs: (P) transition of care needs identified, TOC will continue to follow

## 2023-02-15 NOTE — Progress Notes (Signed)
 PROGRESS NOTE    Courtney Cline  FMW:[id number] DOB: 06-Mar-1933 DOA: 02/13/2023 PCP: Elliot Charm, MD    Chief Complaint  Patient presents with   Shortness of Breath    Brief Narrative:  Patient pleasant 89 year old female history of COPD/chronic hypoxic respiratory failure on 4 L nasal cannula at home, A-fib on chronic anticoagulation with Eliquis , chronic systolic heart failure, CKD 3B, recent COVID-19 infection presented with shortness of breath cough and wheezing.  Chest x-ray done negative for any acute infiltrate.  Patient admitted and being treated for acute COPD exacerbation.   Assessment & Plan:   Principal Problem:   COPD exacerbation (HCC) Active Problems:   Chronic hypoxemic respiratory failure (HCC)   Persistent atrial fibrillation (HCC)   CKD stage 3b, GFR 30-44 ml/min (HCC)   Chronic heart failure with preserved ejection fraction (HFpEF) (HCC)   Obesity (BMI 30-39.9)   Normocytic anemia   Myocardial injury   COPD with acute exacerbation (HCC)  #1 acute COPD exacerbation/chronic hypoxic respiratory failure -Patient presented with cough, shortness of breath, diffuse wheezing. -Patient noted recent COVID-19 infection. -Chest x-ray done negative for any acute infiltrate. -Sputum Gram stain and culture pending. -Patient with clinical improvement. -Placed on Pulmicort  twice daily, Flonase , Mucinex , Claritin , PPI. -Patient noted to have received IV Solu-Medrol  and currently on oral prednisone . -Continue scheduled DuoNebs, IV Rocephin . -Supportive care.  2.  Persistent atrial fibrillation -Continue metoprolol  for rate control, Eliquis  for anticoagulation.  3.  Chronic HFpEF -Compensated. -Euvolemic on examination. -Continue home regimen Lasix , metoprolol , Crestor .  4.  CKD 3B -Stable. -Close to baseline.  5.  Recent COVID-19 infection -Noted to have been treated for COVID-19 infection more than 3 weeks ago with symptomatic resolution and  improvement. -No need to be retested and no need for further isolation.  6.  Obesity BMI 31.09 kg/m. -Lifestyle modification -Outpatient follow-up with PCP.   DVT prophylaxis: Eliquis  Code Status: DNR Family Communication: Updated patient.  No family at bedside. Disposition: Likely home when clinically improved.  Status is: Inpatient The patient will require care spanning > 2 midnights and should be moved to inpatient because: Severity of illness   Consultants:  None  Procedures:  Chest x-ray 02/13/2023   Antimicrobials:  Anti-infectives (From admission, onward)    Start     Dose/Rate Route Frequency Ordered Stop   02/15/23 2200  cefTRIAXone  (ROCEPHIN ) 2 g in sodium chloride  0.9 % 100 mL IVPB        2 g 200 mL/hr over 30 Minutes Intravenous Every 24 hours 02/15/23 0814 02/19/23 2159   02/14/23 2200  cefTRIAXone  (ROCEPHIN ) 1 g in sodium chloride  0.9 % 100 mL IVPB  Status:  Discontinued        1 g 200 mL/hr over 30 Minutes Intravenous Every 24 hours 02/14/23 0227 02/15/23 0814   02/14/23 0145  cefTRIAXone  (ROCEPHIN ) 2 g in sodium chloride  0.9 % 100 mL IVPB        2 g 200 mL/hr over 30 Minutes Intravenous  Once 02/14/23 0133 02/14/23 0256   02/14/23 0145  azithromycin  (ZITHROMAX ) 500 mg in sodium chloride  0.9 % 250 mL IVPB        500 mg 250 mL/hr over 60 Minutes Intravenous  Once 02/14/23 0133 02/14/23 0612         Subjective: Patient sitting up in recliner.  States overall feeling better than she did on admission.  Feels shortness of breath is improving.  Denies any chest pain.  Feels wheezing is improving.  Stated  had a breathing treatment this morning that she feels helped a lot.  Stated was able to work with physical therapy and ambulate in the hallway earlier on this morning.  On 4 L nasal cannula.  Objective: Vitals:   02/15/23 0432 02/15/23 0435 02/15/23 0839 02/15/23 0856  BP: 130/67   (!) 148/58  Pulse: 73   84  Resp: 18   18  Temp: 97.6 F (36.4 C)   (!)  97.2 F (36.2 C)  TempSrc:      SpO2: 99%  98% 98%  Weight:  72.2 kg    Height:        Intake/Output Summary (Last 24 hours) at 02/15/2023 1740 Last data filed at 02/15/2023 0655 Gross per 24 hour  Intake 100.06 ml  Output 100 ml  Net 0.06 ml   Filed Weights   02/13/23 2301 02/15/23 0435  Weight: 72.6 kg 72.2 kg    Examination:  General exam: Appears calm and comfortable  Respiratory system: Minimal expiratory wheezing.  No crackles, no rhonchi, fair air movement.  Speaking in full sentences.  Cardiovascular system: RRR no murmurs rubs or gallops.  No JVD.  No lower extremity edema.  Gastrointestinal system: Abdomen is nondistended, soft and nontender. No organomegaly or masses felt. Normal bowel sounds heard. Central nervous system: Alert and oriented. No focal neurological deficits. Extremities: Symmetric 5 x 5 power. Skin: No rashes, lesions or ulcers Psychiatry: Judgement and insight appear normal. Mood & affect appropriate.     Data Reviewed: I have personally reviewed following labs and imaging studies  CBC: Recent Labs  Lab 02/13/23 2158 02/14/23 0500 02/15/23 0758  WBC 9.8 6.6 8.6  NEUTROABS 8.5*  --   --   HGB 9.6* 8.9* 8.9*  HCT 31.4* 28.3* 28.7*  MCV 96.3 96.9 95.0  PLT 166 156 185    Basic Metabolic Panel: Recent Labs  Lab 02/13/23 2158 02/14/23 0500 02/15/23 0758  NA 137 138 142  K 3.8 3.9 4.0  CL 100 102 105  CO2 25 26 29   GLUCOSE 133* 124* 133*  BUN 18 17 25*  CREATININE 1.32* 1.21* 1.25*  CALCIUM  8.8* 8.5* 9.1  MG  --  2.2  --     GFR: Estimated Creatinine Clearance: 28.1 mL/min (A) (by C-G formula based on SCr of 1.25 mg/dL (H)).  Liver Function Tests: Recent Labs  Lab 02/13/23 2158  AST 17  ALT 9  ALKPHOS 60  BILITOT 0.6  PROT 6.7  ALBUMIN 2.9*    CBG: No results for input(s): GLUCAP in the last 168 hours.   No results found for this or any previous visit (from the past 240 hours).       Radiology Studies: DG  Chest Portable 1 View Result Date: 02/13/2023 CLINICAL DATA:  Shortness of breath and COVID positivity EXAM: PORTABLE CHEST 1 VIEW COMPARISON:  05/03/2018 FINDINGS: Cardiac shadow is mildly prominent but accentuated by the portable technique. Lungs are well aerated bilaterally. Mild right basilar atelectasis is seen. No focal confluent infiltrate is noted. No bony abnormality is seen. IMPRESSION: Mild right basilar atelectasis. Electronically Signed   By: Oneil Devonshire M.D.   On: 02/13/2023 23:48        Scheduled Meds:  apixaban   2.5 mg Oral BID   budesonide  (PULMICORT ) nebulizer solution  0.5 mg Nebulization BID   fluticasone   2 spray Each Nare Daily   furosemide   40 mg Oral Daily   guaiFENesin   1,200 mg Oral BID   ipratropium-albuterol   3 mL Nebulization TID   loratadine   10 mg Oral Daily   metoprolol  tartrate  25 mg Oral BID   pantoprazole   40 mg Oral Q0600   predniSONE   40 mg Oral Q breakfast   rosuvastatin   40 mg Oral Daily   sodium chloride  flush  3 mL Intravenous Q12H   Continuous Infusions:  cefTRIAXone  (ROCEPHIN )  IV       LOS: 1 day    Time spent: 40 minutes    Toribio Hummer, MD [facility]   To contact the attending provider between 7A-7P or the covering provider during after hours 7P-7A, please log into the web site [email or web address] and access using universal [place] password for that web site. If you do not have the password, please call the hospital operator.  02/15/2023, 5:40 PM

## 2023-02-15 NOTE — Plan of Care (Signed)
  Problem: Clinical Measurements: Goal: Respiratory complications will improve Outcome: Progressing   Problem: Activity: Goal: Risk for activity intolerance will decrease Outcome: Progressing   Problem: Pain Management: Goal: General experience of comfort will improve Outcome: Progressing

## 2023-02-15 NOTE — Discharge Instructions (Signed)

## 2023-02-15 NOTE — Evaluation (Signed)
 Occupational Therapy Evaluation Patient Details Name: Courtney Cline MRN: [id number] DOB: 1933/04/24 Today's Date: 02/15/2023   History of Present Illness 89 y.o. female presents to [facility] hospital on 12.30/2024 with SOB, cough and wheezing. Pt admitted for COPD exacerbation and afib. PMH includes COPD, chronic hypoxic respiratory failure, afib, chronic HFpEF, CKD III.   Clinical Impression   Pt in good spirits, eager to participate and return home. Pt lives at home with husband, both are independent with RW, Pt does cooking/cleaning. Pt currently close to baseline, on 4L O2 continuous, set up/supervision for safety due to SOB and decreased activity tolerance. Pt required increased effort to complete LB ADLs, but was able to complete without physical assist. Pt would benefit from [facility] to maximize safety and independence in home setting. Pt has all DME needed to maximize independence, will continue to follow acutely to improve activity tolerance and strength.        If plan is discharge home, recommend the following: A little help with walking and/or transfers;A little help with bathing/dressing/bathroom;Assistance with cooking/housework;Assist for transportation    Functional Status Assessment  Patient has had a recent decline in their functional status and demonstrates the ability to make significant improvements in function in a reasonable and predictable amount of time.  Equipment Recommendations  None recommended by OT    Recommendations for Other Services       Precautions / Restrictions Precautions Precautions: Fall Restrictions Weight Bearing Restrictions Per Provider Order: No      Mobility Bed Mobility Overal bed mobility: Needs Assistance             General bed mobility comments: arrived in recliner    Transfers Overall transfer level: Needs assistance Equipment used: Rolling walker (2 wheels) Transfers: Sit to/from Stand, Bed to chair/wheelchair/BSC Sit to  Stand: Supervision     Step pivot transfers: Supervision     General transfer comment: supervision for safety, no LOB      Balance Overall balance assessment: Needs assistance Sitting-balance support: No upper extremity supported, Feet supported Sitting balance-Leahy Scale: Good     Standing balance support: During functional activity, Reliant on assistive device for balance Standing balance-Leahy Scale: Fair Standing balance comment: Pt able to stand unsupported performing ADLs at sink, no LOB                           ADL either performed or assessed with clinical judgement   ADL Overall ADL's : At baseline;Needs assistance/impaired Eating/Feeding: Independent   Grooming: Supervision/safety;Standing   Upper Body Bathing: Set up   Lower Body Bathing: Set up   Upper Body Dressing : Set up   Lower Body Dressing: Set up   Toilet Transfer: Supervision/safety   Toileting- Clothing Manipulation and Hygiene: Modified independent   Tub/ Shower Transfer: Supervision/safety   Functional mobility during ADLs: Supervision/safety General ADL Comments: Pt able to complete ADLs without physical assistance, set up/supervision, likely at baseline     Vision Baseline Vision/History: 0 No visual deficits Ability to See in Adequate Light: 0 Adequate Patient Visual Report: No change from baseline       Perception         Praxis         Pertinent Vitals/Pain Pain Assessment Pain Assessment: No/denies pain     Extremity/Trunk Assessment Upper Extremity Assessment Upper Extremity Assessment: Overall WFL for tasks assessed   Lower Extremity Assessment Lower Extremity Assessment: Generalized weakness   Cervical / Trunk  Assessment Cervical / Trunk Assessment: Kyphotic   Communication Communication Communication: No apparent difficulties Cueing Techniques: Verbal cues   Cognition Arousal: Alert Behavior During Therapy: WFL for tasks  assessed/performed Overall Cognitive Status: Within Functional Limits for tasks assessed                                       General Comments  4L O2 via [place]    Exercises     Shoulder Instructions      Home Living Family/patient expects to be discharged to:: Private residence Living Arrangements: Spouse/significant other Available Help at Discharge: Family;Available 24 hours/day Type of Home: House Home Access: Stairs to enter [employer] of Steps: 3 Entrance Stairs-Rails: Can reach both Home Layout: One level     Bathroom Shower/Tub: Tub/shower unit         Home Equipment: [employer] (2 wheels);Shower seat;Wheelchair - manual;BSC/3in1   Additional Comments: Pt lives with her husband who is available all day, but cannot assist physically      Prior Functioning/Environment Prior Level of Function : Independent/Modified Independent             Mobility Comments: recently started using RW ADLs Comments: ind        OT Problem List: Decreased strength;Decreased activity tolerance;Decreased range of motion      OT Treatment/Interventions: Self-care/ADL training;Therapeutic exercise;Energy conservation;DME and/or AE instruction;Therapeutic activities;Patient/family education    OT Goals(Current goals can be found in the care plan section) Acute Rehab OT Goals Patient Stated Goal: to return home OT Goal Formulation: With patient Time For Goal Achievement: 03/01/23 Potential to Achieve Goals: Good  OT Frequency: Min 1X/week    Co-evaluation              AM-PAC OT 6 Clicks Daily Activity     Outcome Measure Help from another person eating meals?: None Help from another person taking care of personal grooming?: A Little Help from another person toileting, which includes using toliet, bedpan, or urinal?: A Little Help from another person bathing (including washing, rinsing, drying)?: A Little Help from another person to put  on and taking off regular upper body clothing?: A Little Help from another person to put on and taking off regular lower body clothing?: A Little 6 Click Score: 19   End of Session Equipment Utilized During Treatment: Gait belt;Rolling walker (2 wheels);Oxygen  Nurse Communication: Mobility status  Activity Tolerance: Patient tolerated treatment well Patient left: in chair;with call bell/phone within reach  OT Visit Diagnosis: Other abnormalities of gait and mobility (R26.89);Muscle weakness (generalized) (M62.81)                Time: 1040-1104 OT Time Calculation (min): 24 min Charges:  OT General Charges $OT Visit: 1 Visit OT Evaluation $OT Eval Low Complexity: 1 Low OT Treatments $Self Care/Home Management : 8-22 mins  [place], OTR/L   Elouise JONELLE Bott 02/15/2023, 11:10 AM

## 2023-02-16 ENCOUNTER — Encounter: Payer: Self-pay | Admitting: Hematology

## 2023-02-16 ENCOUNTER — Other Ambulatory Visit (HOSPITAL_COMMUNITY): Payer: Self-pay

## 2023-02-16 DIAGNOSIS — I5032 Chronic diastolic (congestive) heart failure: Secondary | ICD-10-CM | POA: Diagnosis not present

## 2023-02-16 DIAGNOSIS — J441 Chronic obstructive pulmonary disease with (acute) exacerbation: Secondary | ICD-10-CM | POA: Diagnosis not present

## 2023-02-16 DIAGNOSIS — J9611 Chronic respiratory failure with hypoxia: Secondary | ICD-10-CM | POA: Diagnosis not present

## 2023-02-16 DIAGNOSIS — I4819 Other persistent atrial fibrillation: Secondary | ICD-10-CM | POA: Diagnosis not present

## 2023-02-16 DIAGNOSIS — D649 Anemia, unspecified: Secondary | ICD-10-CM

## 2023-02-16 LAB — BASIC METABOLIC PANEL
Anion gap: 10 (ref 5–15)
BUN: 35 mg/dL — ABNORMAL HIGH (ref 8–23)
CO2: 28 mmol/L (ref 22–32)
Calcium: 9 mg/dL (ref 8.9–10.3)
Chloride: 104 mmol/L (ref 98–111)
Creatinine, Ser: 1.54 mg/dL — ABNORMAL HIGH (ref 0.44–1.00)
GFR, Estimated: 32 mL/min — ABNORMAL LOW (ref >60)
Glucose, Bld: 102 mg/dL — ABNORMAL HIGH (ref 70–99)
Potassium: 3.9 mmol/L (ref 3.5–5.1)
Sodium: 142 mmol/L (ref 135–145)

## 2023-02-16 LAB — CBC
HCT: 31.7 % — ABNORMAL LOW (ref 36.0–46.0)
Hemoglobin: 9.7 g/dL — ABNORMAL LOW (ref 12.0–15.0)
MCH: 29.7 pg (ref 26.0–34.0)
MCHC: 30.6 g/dL (ref 30.0–36.0)
MCV: 96.9 fL (ref 80.0–100.0)
Platelets: 240 10*3/uL (ref 150–400)
RBC: 3.27 MIL/uL — ABNORMAL LOW (ref 3.87–5.11)
RDW: 14.1 % (ref 11.5–15.5)
WBC: 11 10*3/uL — ABNORMAL HIGH (ref 4.0–10.5)
nRBC: 0 % (ref 0.0–0.2)

## 2023-02-16 MED ORDER — PANTOPRAZOLE SODIUM 40 MG PO TBEC
40.0000 mg | DELAYED_RELEASE_TABLET | Freq: Every day | ORAL | 0 refills | Status: DC
  Filled 2023-02-16: qty 30, 30d supply, fill #0

## 2023-02-16 MED ORDER — FUROSEMIDE 40 MG PO TABS
20.0000 mg | ORAL_TABLET | Freq: Two times a day (BID) | ORAL | 1 refills | Status: DC
  Filled 2023-02-16: qty 60, 60d supply, fill #0

## 2023-02-16 MED ORDER — PREDNISONE 20 MG PO TABS
40.0000 mg | ORAL_TABLET | Freq: Every day | ORAL | 0 refills | Status: AC
  Filled 2023-02-16: qty 6, 3d supply, fill #0

## 2023-02-16 MED ORDER — AZITHROMYCIN 250 MG PO TABS
250.0000 mg | ORAL_TABLET | Freq: Every day | ORAL | 0 refills | Status: AC
  Filled 2023-02-16: qty 4, 4d supply, fill #0

## 2023-02-16 MED ORDER — FLUTICASONE PROPIONATE 50 MCG/ACT NA SUSP
2.0000 | Freq: Every day | NASAL | 0 refills | Status: DC
  Filled 2023-02-16: qty 16, 30d supply, fill #0

## 2023-02-16 MED ORDER — ALBUTEROL SULFATE (2.5 MG/3ML) 0.083% IN NEBU
INHALATION_SOLUTION | RESPIRATORY_TRACT | 0 refills | Status: DC
  Filled 2023-02-16: qty 90, 8d supply, fill #0

## 2023-02-16 MED ORDER — GUAIFENESIN ER 600 MG PO TB12
1200.0000 mg | ORAL_TABLET | Freq: Two times a day (BID) | ORAL | 0 refills | Status: AC
  Filled 2023-02-16: qty 20, 5d supply, fill #0

## 2023-02-16 MED ORDER — LORATADINE 10 MG PO TABS
10.0000 mg | ORAL_TABLET | Freq: Every day | ORAL | 0 refills | Status: DC
  Filled 2023-02-16: qty 30, 30d supply, fill #0

## 2023-02-16 NOTE — Discharge Summary (Signed)
 Physician Discharge Summary  Courtney Cline FMW:[id number] DOB: January 24, 1934 DOA: 02/13/2023  PCP: Courtney Charm, MD  Admit date: 02/13/2023 Discharge date: 02/16/2023  Time spent: 60 minutes  Recommendations for Outpatient Follow-up:  Follow-up with Courtney Charm, MD in 2 weeks.  On follow-up patient COPD will need to be reassessed.  Patient will need a basic metabolic profile done to follow-up on electrolytes and renal function. Patient be discharged with home health therapies.   Discharge Diagnoses:  Principal Problem:   COPD exacerbation (HCC) Active Problems:   Chronic hypoxemic respiratory failure (HCC)   Persistent atrial fibrillation (HCC)   CKD stage 3b, GFR 30-44 ml/min (HCC)   Chronic heart failure with preserved ejection fraction (HFpEF) (HCC)   Obesity (BMI 30-39.9)   Normocytic anemia   Myocardial injury   COPD with acute exacerbation (HCC)   Discharge Condition: Stable and improved.  Diet recommendation: Heart healthy  Filed Weights   02/13/23 2301 02/15/23 0435 02/16/23 0500  Weight: 72.6 kg 72.2 kg 73 kg    History of present illness:  HPI per Courtney Cline Courtney Cline is a 89 y.o. female with medical history significant for COPD, chronic hypoxic respiratory failure, atrial fibrillation on Eliquis , chronic HFpEF, CKD 3B, and recent COVID-19 infection who presents with shortness of breath, cough, and wheezing.   Patient reports that she had COVID-19 infection beginning around 01/19/2023, completed a course of molnupiravir, and her symptoms resolved.  Over the past week however, she has developed a productive cough, wheezing, and shortness of breath.  Symptoms improved momentarily with breathing treatments at home.  She denies fevers, chills, rhinorrhea, sore throat, or change in her chronic mild lower extremity edema.   ED Course: Upon arrival to the ED, patient is found to be afebrile and saturating well on 4 L/min of supplemental  oxygen  with mild tachypnea, tachycardia, and stable blood pressure.  EKG demonstrates atrial flutter with rate 149.  Chest x-ray notable for mild right basilar atelectasis.  Labs are most remarkable for creatinine 1.32, albumin 2.9, normal WBC, and BNP 361.   Patient was treated with antibiotics in the ED.  Hospital Course:  #1 acute COPD exacerbation/chronic hypoxic respiratory failure -Patient presented with cough, shortness of breath, diffuse wheezing. -Patient noted recent COVID-19 infection. -Chest x-ray done negative for any acute infiltrate. -Sputum Gram stain and culture pending. -Patient was placed on Pulmicort  twice daily, Flonase , Mucinex , Claritin , PPI, IV steroid and subsequently transition to oral prednisone , scheduled DuoNebs and IV Rocephin .   -Patient improved clinically during the hospitalization and was back on her home O2 of 4 L nasal cannula and ambulating with the aid of a walker without any worsening shortness of breath and felt much better than she did on admission.  -Patient improved clinically and will be discharged home in stable and improved condition on 3 more days of oral prednisone  40 mg daily, 4 days of azithromycin , nebulizer treatments, resumption of home Trelegy.   -Will need outpatient follow-up with PCP 2 weeks postdischarge.    2.  Persistent atrial fibrillation -Patient maintained on home regimen metoprolol  for rate control, Eliquis  for anticoagulation.   3.  Chronic HFpEF -Compensated. -Euvolemic on examination. -Patient was maintained on home regimen Lasix , metoprolol , Crestor .   4.  CKD 3B -Stable. -Close to baseline.   5.  Recent COVID-19 infection -Noted to have been treated for COVID-19 infection more than 3 weeks ago with symptomatic resolution and improvement. -No need to be retested and no need for further isolation.  6.  Obesity BMI 31.09 kg/m. -Lifestyle modification -Outpatient follow-up with PCP.    Procedures: Chest x-ray  02/13/2023   Consultations: None  Discharge Exam: Vitals:   02/16/23 0700 02/16/23 0836  BP:  (!) 135/46  Pulse:  78  Resp:  18  Temp:  98 F (36.7 C)  SpO2: 100% 100%    General: NAD Cardiovascular: RRR no murmurs rubs or gallops.  No JVD.  No lower extremity edema. Respiratory: Lungs clear to auscultation bilaterally.  No wheezes, no crackles, no rhonchi.  Fair air movement.  Speaking in full sentences.  Discharge Instructions   Discharge Instructions     Diet - low sodium heart healthy   Complete by: As directed    Increase activity slowly   Complete by: As directed       Allergies as of 02/16/2023   No Known Allergies      Medication List     TAKE these medications    acetaminophen  325 MG tablet Commonly known as: TYLENOL  Take 650 mg by mouth every 6 (six) hours as needed for mild pain or headache.   apixaban  2.5 MG Tabs tablet Commonly known as: ELIQUIS  Take 1 tablet (2.5 mg total) by mouth 2 (two) times daily. What changed: when to take this   azithromycin  250 MG tablet Commonly known as: Zithromax  Take 1 tablet (250 mg total) by mouth daily for 4 days.   fluticasone  50 MCG/ACT nasal spray Commonly known as: FLONASE  Place 2 sprays into both nostrils daily for 10 days. Start taking on: February 17, 2023   furosemide  40 MG tablet Commonly known as: LASIX  Take 0.5 tablets (20 mg total) by mouth 2 (two) times daily. <PLEASE MAKE APPOINTMENT FOR REFILLS>   guaiFENesin  600 MG 12 hr tablet Commonly known as: MUCINEX  Take 2 tablets (1,200 mg total) by mouth 2 (two) times daily for 5 days.   loratadine  10 MG tablet Commonly known as: CLARITIN  Take 1 tablet (10 mg total) by mouth daily. Start taking on: February 17, 2023   metoprolol  tartrate 25 MG tablet Commonly known as: LOPRESSOR  Take 1 tablet (25 mg total) by mouth 2 (two) times daily.   pantoprazole  40 MG tablet Commonly known as: PROTONIX  Take 1 tablet (40 mg total) by mouth daily at 6 (six)  AM. Start taking on: February 17, 2023   predniSONE  20 MG tablet Commonly known as: DELTASONE  Take 2 tablets (40 mg total) by mouth daily with breakfast for 3 days. Start taking on: February 17, 2023   ProAir  HFA 108 (90 Base) MCG/ACT inhaler Generic drug: albuterol  TAKE 2 PUFFS BY MOUTH EVERY 6 HOURS AS NEEDED FOR WHEEZE OR SHORTNESS OF BREATH What changed: See the new instructions.   albuterol  (2.5 MG/3ML) 0.083% nebulizer solution Commonly known as: PROVENTIL  Take 3 mLs (2.5 mg total) by nebulization 3 (three) times daily for 3 days, THEN 3 mLs (2.5 mg total) every 6 (six) hours as needed for wheezing or shortness of breath. Start taking on: February 16, 2023 What changed: See the new instructions.   rosuvastatin  40 MG tablet Commonly known as: CRESTOR  Take 40 mg by mouth daily.   Trelegy Ellipta  200-62.5-25 MCG/ACT Aepb Generic drug: Fluticasone -Umeclidin-Vilant INHALE ONE PUFF into lungs DAILY   VITAMIN B 12 PO Take 1 tablet by mouth daily.   Vitamin D  50 MCG (2000 UT) Caps Take 2,000 Units by mouth daily.       No Known Allergies  Follow-up Information     [facility] Follow up.   Why: [facility] will provide home health services.  They will call you in the next 24-48 hours to set up services. Contact information: [address], [address] [place] [address] [phone]         Courtney Charm, MD. Schedule an appointment as soon as possible for a visit in 2 week(s).   Specialty: Internal Medicine Contact information: [address] [name] [address] [phone]                  The results of significant diagnostics from this hospitalization (including imaging, microbiology, ancillary and laboratory) are listed below for reference.    Significant Diagnostic Studies: DG Chest Portable 1 View Result Date: 02/13/2023 CLINICAL DATA:  Shortness of breath and COVID positivity  EXAM: PORTABLE CHEST 1 VIEW COMPARISON:  05/03/2018 FINDINGS: Cardiac shadow is mildly prominent but accentuated by the portable technique. Lungs are well aerated bilaterally. Mild right basilar atelectasis is seen. No focal confluent infiltrate is noted. No bony abnormality is seen. IMPRESSION: Mild right basilar atelectasis. Electronically Signed   By: Oneil Devonshire M.D.   On: 02/13/2023 23:48    Microbiology: No results found for this or any previous visit (from the past 240 hours).   Labs: Basic Metabolic Panel: Recent Labs  Lab 02/13/23 2158 02/14/23 0500 02/15/23 0758 02/16/23 0802  NA 137 138 142 142  K 3.8 3.9 4.0 3.9  CL 100 102 105 104  CO2 25 26 29 28   GLUCOSE 133* 124* 133* 102*  BUN 18 17 25* 35*  CREATININE 1.32* 1.21* 1.25* 1.54*  CALCIUM  8.8* 8.5* 9.1 9.0  MG  --  2.2  --   --    Liver Function Tests: Recent Labs  Lab 02/13/23 2158  AST 17  ALT 9  ALKPHOS 60  BILITOT 0.6  PROT 6.7  ALBUMIN 2.9*   No results for input(s): LIPASE, AMYLASE in the last 168 hours. No results for input(s): AMMONIA in the last 168 hours. CBC: Recent Labs  Lab 02/13/23 2158 02/14/23 0500 02/15/23 0758 02/16/23 0802  WBC 9.8 6.6 8.6 11.0*  NEUTROABS 8.5*  --   --   --   HGB 9.6* 8.9* 8.9* 9.7*  HCT 31.4* 28.3* 28.7* 31.7*  MCV 96.3 96.9 95.0 96.9  PLT 166 156 185 240   Cardiac Enzymes: No results for input(s): CKTOTAL, CKMB, CKMBINDEX, TROPONINI in the last 168 hours. BNP: BNP (last 3 results) Recent Labs    02/13/23 2158  BNP 361.3*    ProBNP (last 3 results) No results for input(s): PROBNP in the last 8760 hours.  CBG: No results for input(s): GLUCAP in the last 168 hours.     Signed:  Toribio Hummer MD.  [facility] 02/16/2023, 12:27 PM

## 2023-02-16 NOTE — Plan of Care (Signed)

## 2023-02-17 ENCOUNTER — Telehealth: Payer: Self-pay | Admitting: Hematology

## 2023-02-17 ENCOUNTER — Inpatient Hospital Stay: Payer: Medicare HMO

## 2023-02-17 ENCOUNTER — Inpatient Hospital Stay: Payer: Medicare HMO | Admitting: Hematology

## 2023-02-17 NOTE — Assessment & Plan Note (Deleted)
-  diagnosed in 10/2013, s/p right lumpectomy. Completed 5 years of anastrozole in 11/2018. -most recent mammogram 08/16/21 was negative.

## 2023-02-17 NOTE — Assessment & Plan Note (Deleted)
-  long standing history of low hgb secondary to CKD -she is on Eliquis for A.fib -endorses taking oral iron  -prior stool occult test was negative. -she recently presented to her PCP with nosebleeds. She was seen by Dr. Marene Lenz in ENT on 07/05/21. -we started her on retacrit injections on 07/01/21. She also receives IV Venofer for ferritin <100. She tolerates these well with no side effects.  -lab reviewed, hgb is up to 11 today. Given this, we will hold retacrit injection today, proceed with next dose in 2 weeks, then change to every 4 weeks given good response.

## 2023-02-26 NOTE — Assessment & Plan Note (Deleted)
-  diagnosed in 10/2013, s/p right lumpectomy. Completed 5 years of anastrozole in 11/2018. -most recent mammogram 08/16/21 was negative.

## 2023-02-26 NOTE — Assessment & Plan Note (Deleted)
-  long standing history of low hgb secondary to CKD -she is on Eliquis  for A.fib -endorses taking oral iron   -prior stool occult test was negative. -she recently presented to her PCP with nosebleeds. She was seen by Dr. Llewellyn in ENT on 07/05/21. -we started her on retacrit  injections on 07/01/21. She also receives IV Venofer  for ferritin <100. She tolerates these well with no side effects.

## 2023-02-27 ENCOUNTER — Inpatient Hospital Stay: Payer: Medicare HMO | Attending: Hematology

## 2023-02-27 ENCOUNTER — Inpatient Hospital Stay: Payer: Medicare HMO

## 2023-02-27 ENCOUNTER — Telehealth: Payer: Self-pay

## 2023-02-27 ENCOUNTER — Inpatient Hospital Stay: Payer: Medicare HMO | Admitting: Hematology

## 2023-02-27 NOTE — Telephone Encounter (Signed)
 Spoke with pt via telephone regarding appts today. Pt stated she LVM on the Scheduling Team's voicemail stating she need to cancel her appts today.  Canceled pt's appt for today.

## 2023-03-20 ENCOUNTER — Other Ambulatory Visit: Payer: Self-pay | Admitting: Cardiovascular Disease

## 2023-03-20 DIAGNOSIS — I4819 Other persistent atrial fibrillation: Secondary | ICD-10-CM

## 2023-03-20 NOTE — Telephone Encounter (Signed)
Eliquis 2.5mg  refill request received. Patient is 89 years old, weight-73kg, Crea-1.54 on 02/16/23, Diagnosis-Afib, and last seen by Dr. Allyson Sabal on 04/12/22. Dose is appropriate based on dosing criteria. Will send in refill to requested pharmacy.

## 2023-03-22 DIAGNOSIS — J449 Chronic obstructive pulmonary disease, unspecified: Secondary | ICD-10-CM | POA: Diagnosis not present

## 2023-03-22 DIAGNOSIS — Z853 Personal history of malignant neoplasm of breast: Secondary | ICD-10-CM | POA: Diagnosis not present

## 2023-03-22 DIAGNOSIS — J9611 Chronic respiratory failure with hypoxia: Secondary | ICD-10-CM | POA: Diagnosis not present

## 2023-03-22 DIAGNOSIS — I10 Essential (primary) hypertension: Secondary | ICD-10-CM | POA: Diagnosis not present

## 2023-03-22 DIAGNOSIS — N1832 Chronic kidney disease, stage 3b: Secondary | ICD-10-CM | POA: Diagnosis not present

## 2023-03-22 DIAGNOSIS — N183 Chronic kidney disease, stage 3 unspecified: Secondary | ICD-10-CM | POA: Diagnosis not present

## 2023-03-22 DIAGNOSIS — I5032 Chronic diastolic (congestive) heart failure: Secondary | ICD-10-CM | POA: Diagnosis not present

## 2023-03-30 ENCOUNTER — Telehealth: Payer: Self-pay | Admitting: Hematology

## 2023-03-30 NOTE — Telephone Encounter (Signed)
Patient and patient's son are aware of scheduled appointment times/dates for scheduled follow up

## 2023-04-17 ENCOUNTER — Inpatient Hospital Stay: Payer: Medicare HMO

## 2023-04-17 ENCOUNTER — Inpatient Hospital Stay: Payer: Medicare HMO | Attending: Hematology | Admitting: Hematology

## 2023-04-17 ENCOUNTER — Encounter: Payer: Self-pay | Admitting: Hematology

## 2023-04-17 VITALS — BP 119/81 | HR 137 | Temp 97.6°F | Resp 19 | Wt 152.8 lb

## 2023-04-17 DIAGNOSIS — C50211 Malignant neoplasm of upper-inner quadrant of right female breast: Secondary | ICD-10-CM

## 2023-04-17 DIAGNOSIS — Z170 Estrogen receptor positive status [ER+]: Secondary | ICD-10-CM | POA: Diagnosis not present

## 2023-04-17 DIAGNOSIS — D631 Anemia in chronic kidney disease: Secondary | ICD-10-CM | POA: Insufficient documentation

## 2023-04-17 DIAGNOSIS — Z853 Personal history of malignant neoplasm of breast: Secondary | ICD-10-CM | POA: Insufficient documentation

## 2023-04-17 DIAGNOSIS — D638 Anemia in other chronic diseases classified elsewhere: Secondary | ICD-10-CM

## 2023-04-17 DIAGNOSIS — Z1231 Encounter for screening mammogram for malignant neoplasm of breast: Secondary | ICD-10-CM | POA: Diagnosis not present

## 2023-04-17 DIAGNOSIS — N184 Chronic kidney disease, stage 4 (severe): Secondary | ICD-10-CM | POA: Diagnosis not present

## 2023-04-17 DIAGNOSIS — I129 Hypertensive chronic kidney disease with stage 1 through stage 4 chronic kidney disease, or unspecified chronic kidney disease: Secondary | ICD-10-CM | POA: Insufficient documentation

## 2023-04-17 LAB — FERRITIN: Ferritin: 170 ng/mL (ref 11–307)

## 2023-04-17 LAB — COMPREHENSIVE METABOLIC PANEL
ALT: 5 U/L (ref 0–44)
AST: 13 U/L — ABNORMAL LOW (ref 15–41)
Albumin: 3.9 g/dL (ref 3.5–5.0)
Alkaline Phosphatase: 72 U/L (ref 38–126)
Anion gap: 8 (ref 5–15)
BUN: 18 mg/dL (ref 8–23)
CO2: 29 mmol/L (ref 22–32)
Calcium: 9.7 mg/dL (ref 8.9–10.3)
Chloride: 104 mmol/L (ref 98–111)
Creatinine, Ser: 1.29 mg/dL — ABNORMAL HIGH (ref 0.44–1.00)
GFR, Estimated: 40 mL/min — ABNORMAL LOW (ref >60)
Glucose, Bld: 111 mg/dL — ABNORMAL HIGH (ref 70–99)
Potassium: 4.1 mmol/L (ref 3.5–5.1)
Sodium: 141 mmol/L (ref 135–145)
Total Bilirubin: 0.5 mg/dL (ref 0.0–1.2)
Total Protein: 7.5 g/dL (ref 6.5–8.1)

## 2023-04-17 LAB — CBC WITH DIFFERENTIAL (CANCER CENTER ONLY)
Abs Immature Granulocytes: 0.03 10*3/uL (ref 0.00–0.07)
Basophils Absolute: 0 10*3/uL (ref 0.0–0.1)
Basophils Relative: 1 %
Eosinophils Absolute: 0.4 10*3/uL (ref 0.0–0.5)
Eosinophils Relative: 5 %
HCT: 37.2 % (ref 36.0–46.0)
Hemoglobin: 11.3 g/dL — ABNORMAL LOW (ref 12.0–15.0)
Immature Granulocytes: 0 %
Lymphocytes Relative: 14 %
Lymphs Abs: 1 10*3/uL (ref 0.7–4.0)
MCH: 30.2 pg (ref 26.0–34.0)
MCHC: 30.4 g/dL (ref 30.0–36.0)
MCV: 99.5 fL (ref 80.0–100.0)
Monocytes Absolute: 0.5 10*3/uL (ref 0.1–1.0)
Monocytes Relative: 7 %
Neutro Abs: 5 10*3/uL (ref 1.7–7.7)
Neutrophils Relative %: 73 %
Platelet Count: 244 10*3/uL (ref 150–400)
RBC: 3.74 MIL/uL — ABNORMAL LOW (ref 3.87–5.11)
RDW: 14.8 % (ref 11.5–15.5)
WBC Count: 6.8 10*3/uL (ref 4.0–10.5)
nRBC: 0 % (ref 0.0–0.2)

## 2023-04-17 NOTE — Progress Notes (Signed)
 [facility]   Telephone:(336) [phone] [phone]   Clinic Follow up Note   Patient Care Team: Lorenda Ishihara, MD as PCP - General (Internal Medicine) Runell Gess, MD as PCP - Cardiology (Cardiology) Lupita Leash, MD as Consulting Physician (Pulmonary Disease) Malachy Mood, MD as Consulting Physician (Hematology) Harriette Bouillon, MD as Consulting Physician (General Surgery)  Date of Service:  04/17/2023  CHIEF COMPLAINT: f/u of anemia  CURRENT THERAPY:  Retacrit as needed  Oncology History   Breast cancer of upper-inner quadrant of right female breast (HCC) -diagnosed in 10/2013, s/p right lumpectomy. Completed 5 years of anastrozole in 11/2018. -most recent mammogram 08/16/21 was negative.     Assessment and Plan    Breast Cancer Follow-Up Follow-up for breast cancer diagnosed ten years ago. Last mammogram was in July 2023. No new symptoms reported. Plans to attend mammogram this year. - Order mammogram - Schedule mammogram at breast center  Anemia Chronic mild anemia. Hemoglobin improved to 11.3 from previous values of 8.9-9.3. Reports fatigue when anemic. Previously receiving injections every two weeks, now considering every six weeks. Discussed monitoring hemoglobin levels and adjusting injection frequency based on symptoms and lab results. - Schedule anemia injections every six weeks - Monitor hemoglobin levels; if levels drop below 9, increase frequency of injections - Advise patient to call and move up injection appointment if feeling fatigued before next scheduled injection  Atrial Fibrillation Chronic atrial fibrillation. Currently on Eliquis and metoprolol. No palpitations reported. Heart rate noted to be high today. Discussed importance of continuing medication to manage heart rate and prevent complications. - Continue Eliquis - Continue metoprolol  Skin Infection Fungal infection under the right breast. Has powder at home  for treatment. Discussed keeping the area dry and using a lifting bra to reduce moisture accumulation. - Advise use of powder for fungal infection - Recommend wearing a lifting bra to reduce moisture accumulation  Plan -Lab reviewed, hemoglobin 11.3, will cancel Retacrit injection today  -Change lab Endotak to injection to every 6 weeks -Follow-up in 6 months -I ordered a mammogram for her, she will call breast center to schedule.       SUMMARY OF ONCOLOGIC HISTORY: Oncology History Overview Note  Breast cancer of upper-inner quadrant of right female breast   Staging form: Breast, AJCC 7th Edition     Clinical: Stage Unknown (T1c, NX, cM0) - Signed by Renaye Rakers, MD on 11/19/2013       Staging comments: ER 100%+, PR 100%+, Ki-67 7%      Breast cancer of upper-inner quadrant of right female breast (HCC)  12/06/2012 Imaging   Bone Density performed at [facility] physicians T score Lumbar spine -0.1 (-0.7 in 2012), Right neck femur -1.4 (-1.2 before), Left neck femur -1.4 (-1.5 before) and Interpretation is OSTEOPENIA by WHO criteria.    10/11/2013 Mammogram   Abnormal Screening mammogram showing Right Breast mass.   10/23/2013 Breast US   Diagnostic mammogram and US showed an irregular hypoechoic mass at 9'0 clock position right breast 8 cm from nipple. [place] measurement 1.2x0.7x1.1 cm, no axillary adenopathy.   10/23/2013 Initial Biopsy   US guided biopsy  done with clip placement.   10/23/2013 Pathology Results   Invasive ductal carcinoma (IDC), DCIS, invasive cancer grade 2, ER 100%+, PR 100%+, Ki-67% 7%, HER-2/NEU by CISH No amplification, ratio of her2:cep17 1.00, average her2 copy number per cell 1.95 (HER 2 Negative tumor). Molecular Classification LUMINAL A.   11/01/2013 Surgery   Initial  surgical evaluation by Dr Maisie Fus Cornett: "Not good surgical candidate because of pulmonary status". Recommended anti-estrogen therapy.   11/18/2013 -  Anti-estrogen oral therapy    Anastrozole 1 mg once daily   12/20/2013 Surgery   right breast lumpectomy without SLN biopsy, negative margins    12/20/2013 Pathologic Stage   pT1cNxMx, G2, LVI (-), tumor measures 1.2cm. Surgical margins are negative.     10/18/2016 Imaging   MM DIAG BREAST TOMO BILATERAL 10/18/16 IMPRESSION: No mammographic evidence of malignancy involving either breast. Expected post lumpectomy changes in the right breast.   11/08/2018 Mammogram   IMPRESSION: No evidence of malignancy in either breast. Lumpectomy changes on the right. RECOMMENDATION: Diagnostic mammogram is suggested in 1 year. (Code:[id number]-B-01Y)      Discussed the use of AI scribe software for clinical note transcription with the patient, who gave verbal consent to proceed.  History of Present Illness   The patient, an 89 year old female with a history of breast cancer, atrial fibrillation, and mild anemia, presents for a routine follow-up. She was hospitalized in December of the previous year due to pneumonia but has since recovered. She reports no new symptoms or issues since her last visit in June of the previous year. She is on several medications including Eliquis for her AFib, furosemide, a water pill, and metoprolol for her heart rate. She also uses an inhaler as needed for her lungs and is on oxygen at home. She had breast cancer ten years ago and has not had a mammogram in over a year. She also has mild anemia and receives injections for it. The patient's son is responsible for scheduling her appointments.         All other systems were reviewed with the patient and are negative.  MEDICAL HISTORY:  Past Medical History:  Diagnosis Date   Allergic rhinitis    Arthritis    Breast calcification seen on mammogram    Right breast   Breast cancer (HCC)    COPD (chronic obstructive pulmonary disease) (HCC)    GERD (gastroesophageal reflux disease)    Hyperglycemia    Hyperlipidemia    Hypertension    Low back pain     On home oxygen therapy    all the time   Osteopenia    Peripheral edema    Shortness of breath    Subclavian artery stenosis, left (HCC)    Vitamin D deficiency    Wears dentures    top   Wears glasses    reading    SURGICAL HISTORY: Past Surgical History:  Procedure Laterality Date   BREAST BIOPSY     BREAST LUMPECTOMY     right 2015   BREAST LUMPECTOMY WITH RADIOACTIVE SEED LOCALIZATION Right 12/20/2013   Procedure: RIGHT BREAST LUMPECTOMY WITH RADIOACTIVE SEED LOCALIZATION;  Surgeon: Harriette Bouillon, MD;  Location: [facility];  Service: General;  Laterality: Right;   CAROTID DUPLEX  2014   CATARACT EXTRACTION     both eyes    I have reviewed the social history and family history with the patient and they are unchanged from previous note.  ALLERGIES:  has no known allergies.  MEDICATIONS:  Current Outpatient Medications  Medication Sig Dispense Refill   acetaminophen (TYLENOL) 325 MG tablet Take 650 mg by mouth every 6 (six) hours as needed for mild pain or headache.      albuterol (PROVENTIL) (2.5 MG/3ML) 0.083% nebulizer solution Take 3 mLs (2.5 mg total) by nebulization 3 (three) times daily for  3 days, THEN 3 mLs (2.5 mg total) every 6 (six) hours as needed for wheezing or shortness of breath. 90 mL 0   apixaban (ELIQUIS) 2.5 MG TABS tablet TAKE ONE TABLET TWICE DAILY 60 tablet 5   Cholecalciferol (VITAMIN D) 2000 UNITS CAPS Take 2,000 Units by mouth daily.      Cyanocobalamin (VITAMIN B 12 PO) Take 1 tablet by mouth daily.     fluticasone (FLONASE) 50 MCG/ACT nasal spray Place 2 sprays into both nostrils daily for 10 days. 16 g 0   furosemide (LASIX) 40 MG tablet Take 0.5 tablets (20 mg total) by mouth 2 (two) times daily. <PLEASE MAKE APPOINTMENT FOR REFILLS> 60 tablet 1   loratadine (CLARITIN) 10 MG tablet Take 1 tablet (10 mg total) by mouth daily. 30 tablet 0   metoprolol tartrate (LOPRESSOR) 25 MG tablet Take 1 tablet (25 mg total) by mouth 2 (two)  times daily. (Patient taking differently: Take 37.5 mg by mouth 2 (two) times daily.) 60 tablet 0   pantoprazole (PROTONIX) 40 MG tablet Take 1 tablet (40 mg total) by mouth daily at 6 (six) AM. 30 tablet 0   PROAIR HFA 108 (90 Base) MCG/ACT inhaler TAKE 2 PUFFS BY MOUTH EVERY 6 HOURS AS NEEDED FOR WHEEZE OR SHORTNESS OF BREATH (Patient taking differently: Inhale 2 puffs into the lungs every 6 (six) hours as needed for wheezing or shortness of breath.) 8.5 Inhaler 11   rosuvastatin (CRESTOR) 40 MG tablet Take 40 mg by mouth daily.     TRELEGY ELLIPTA 200-62.5-25 MCG/ACT AEPB INHALE ONE PUFF into lungs DAILY 60 each 5   No current facility-administered medications for this visit.    PHYSICAL EXAMINATION: ECOG PERFORMANCE STATUS: 2 - Symptomatic, <50% confined to bed  Vitals:   04/17/23 1350  BP: 119/81  Pulse: (!) 137  Resp: 19  Temp: 97.6 F (36.4 C)  SpO2: 100%   Wt Readings from Last 3 Encounters:  04/17/23 152 lb 12.8 oz (69.3 kg)  02/16/23 160 lb 15 oz (73 kg)  02/13/23 160 lb (72.6 kg)     GENERAL:alert, no distress and comfortable SKIN: skin color, texture, turgor are normal, no rashes or significant lesions except significant skin erythema underneath her right breast, consistent with fungal infection. EYES: normal, Conjunctiva are pink and non-injected, sclera clear Musculoskeletal:no cyanosis of digits and no clubbing  NEURO: alert & oriented x 3 with fluent speech, no focal motor/sensory deficits Breasts: Breast inspection showed them to be symmetrical with no nipple discharge. Palpation of the breasts and axilla revealed no obvious mass that I could appreciate.  LABORATORY DATA:  I have reviewed the data as listed    Latest Ref Rng & Units 04/17/2023    1:29 PM 02/16/2023    8:02 AM 02/15/2023    7:58 AM  CBC  WBC 4.0 - 10.5 K/uL 6.8  11.0  8.6   Hemoglobin 12.0 - 15.0 g/dL 78.4  9.7  8.9   Hematocrit 36.0 - 46.0 % 37.2  31.7  28.7   Platelets 150 - 400 K/uL 244  240   185         Latest Ref Rng & Units 04/17/2023    1:29 PM 02/16/2023    8:02 AM 02/15/2023    7:58 AM  CMP  Glucose 70 - 99 mg/dL 696  295  284   BUN 8 - 23 mg/dL 18  35  25   Creatinine 0.44 - 1.00 mg/dL 1.32  4.40  1.02  Sodium 135 - 145 mmol/L 141  142  142   Potassium 3.5 - 5.1 mmol/L 4.1  3.9  4.0   Chloride 98 - 111 mmol/L 104  104  105   CO2 22 - 32 mmol/L 29  28  29    Calcium 8.9 - 10.3 mg/dL 9.7  9.0  9.1   Total Protein 6.5 - 8.1 g/dL 7.5     Total Bilirubin 0.0 - 1.2 mg/dL 0.5     Alkaline Phos 38 - 126 U/L 72     AST 15 - 41 U/L 13     ALT 0 - 44 U/L 5         RADIOGRAPHIC STUDIES: I have personally reviewed the radiological images as listed and agreed with the findings in the report. No results found.    Orders Placed This Encounter  Procedures   MM Digital Screening    Standing Status:   Future    Expected Date:   05/18/2023    Expiration Date:   04/16/2024    Reason for Exam (SYMPTOM  OR DIAGNOSIS REQUIRED):   screening    [facility]   All questions were answered. The patient knows to call the clinic with any problems, questions or concerns. No barriers to learning was detected. The total time spent in the appointment was 25 minutes.     Malachy Mood, MD 04/17/2023

## 2023-04-17 NOTE — Progress Notes (Signed)
 Per Dr. Mosetta Putt, no injection today. Injection appt canceled.

## 2023-04-17 NOTE — Assessment & Plan Note (Signed)
 -  diagnosed in 10/2013, s/p right lumpectomy. Completed 5 years of anastrozole in 11/2018. -most recent mammogram 08/16/21 was negative.

## 2023-04-27 ENCOUNTER — Ambulatory Visit (INDEPENDENT_AMBULATORY_CARE_PROVIDER_SITE_OTHER): Admitting: Pulmonary Disease

## 2023-04-27 ENCOUNTER — Encounter (HOSPITAL_BASED_OUTPATIENT_CLINIC_OR_DEPARTMENT_OTHER): Payer: Self-pay | Admitting: Pulmonary Disease

## 2023-04-27 ENCOUNTER — Ambulatory Visit (HOSPITAL_BASED_OUTPATIENT_CLINIC_OR_DEPARTMENT_OTHER)

## 2023-04-27 VITALS — BP 122/84 | HR 87 | Ht 60.0 in | Wt 152.0 lb

## 2023-04-27 DIAGNOSIS — J441 Chronic obstructive pulmonary disease with (acute) exacerbation: Secondary | ICD-10-CM | POA: Diagnosis not present

## 2023-04-27 DIAGNOSIS — J9811 Atelectasis: Secondary | ICD-10-CM | POA: Diagnosis not present

## 2023-04-27 DIAGNOSIS — J90 Pleural effusion, not elsewhere classified: Secondary | ICD-10-CM | POA: Diagnosis not present

## 2023-04-27 DIAGNOSIS — R0602 Shortness of breath: Secondary | ICD-10-CM

## 2023-04-27 DIAGNOSIS — R0902 Hypoxemia: Secondary | ICD-10-CM

## 2023-04-27 DIAGNOSIS — J9611 Chronic respiratory failure with hypoxia: Secondary | ICD-10-CM | POA: Diagnosis not present

## 2023-04-27 MED ORDER — TRELEGY ELLIPTA 200-62.5-25 MCG/ACT IN AEPB: 1.0000 | INHALATION_SPRAY | Freq: Every day | RESPIRATORY_TRACT | 11 refills | Status: AC

## 2023-04-27 MED ORDER — PREDNISONE 10 MG PO TABS: ORAL_TABLET | ORAL | 0 refills | Status: AC

## 2023-04-27 MED ORDER — AZITHROMYCIN 250 MG PO TABS: ORAL_TABLET | ORAL | 0 refills | Status: DC

## 2023-04-27 MED ORDER — ALBUTEROL SULFATE HFA 108 (90 BASE) MCG/ACT IN AERS
2.0000 | INHALATION_SPRAY | Freq: Four times a day (QID) | RESPIRATORY_TRACT | 6 refills | Status: DC | PRN
Start: 2023-04-27 — End: 2023-10-24

## 2023-04-27 NOTE — Patient Instructions (Signed)
 COPD exacerbation Prednisone taper ordered CXR ordered. Will call if antibiotics needed CONTINUE Trelegy 200 ONE puff ONCE a day. Rinse mouth out after use CONTINUE Albuterol AS NEEDED for shortness of breath or wheezing.  Chronic hypoxemic respiratory failure Ambulatory O2 with desaturations CONTINUE 4L oxygen via nasal cannula with activity and sleep. RECERTIFIED today Goal for SpO2 >88%  Chronic diastolic heart failure Lower extremity swelling Take additional 1/2 pill (20 mg) for increased weight of 3 lbs in a day or 5 lbs in a week If you are taking this frequently, please contact your cardiologist

## 2023-04-27 NOTE — Progress Notes (Signed)
 Subjective:   PATIENT ID: Courtney Cline GENDER: female DOB: 12/28/1933, MRN: [id number]   HPI  Chief Complaint  Patient presents with   Follow-up    COPD    Reason for Visit: Follow-up  Ms. Courtney Cline is an 89 year old female former smoker with COPD, chronic respiratory failure, chronic diastolic heart failure, moderate pulmonary hypertension, hypertension, atrial fibrillation, subclavian arterial stenosis, hyperlipidemia, CKD IV, history of breast cancer.   Initial consult 08/2021 She is a former Dr. Chestine Spore patient.  Last seen on 07/17/2019.  She was seen again in clinic 08/31/2020 with NP Clent Ridges.  Ambulatory O2 follow-up demonstrated stable O2 requirement of 3 L. she is currently on Trelegy 100.   She is compliant with Trelegy daily and uses albuterol 2-3 times after exertion. Occasional sputum production. Denies wheezing. Does not have any symptoms at night. Compliant with her oxygen with activity and sleep. Has been on stable oxygen 8 years. Able to shower, dress and care for herself without assistance. Currently lives with husband. Sons help with grocery shopping and errands. She and husband no longer drive. She was last hospitalized 02/2021 for COPD exacerbation and this was followed by COVID infection after being discharged home. She took Paxlovid and recovered to baseline.  05/03/22 Son present and provides additional history. Since our last visit she was hospitalized for pneumonia in Nov 2023 requiring antibiotics. Was discharged on her home 3L O2. No further exacerbations since then so had a good holiday break. However she reports for the last few days she has had lower oxygen readings to the 70s but would return to 94-95%. Associated with shortness of breath. Some productive cough. Denies wheezing. Denies recent fever, chills or sick contacts. She is sedentary at baseline but walks around the house. Able to perform ADLs without assistance. Reports worsening fluid in her  legs as well and has not taken an extra lasix.  04/27/23 Since our last visit she was hospitalized from 02/13/23-02/16/23 for COPD exacerbation. She is on Trelegy 200 daily. Uses albuterol 2-4 times a day. Rarely uses nebulizer. She reports in the last week she has had worsening shortness of breath, wheezing and productive cough. Denies fevers, chills, body aches. Denies sick contacts. She is compliant with oxygen with activity 4L and sleep. Denies worsening lower extremity swelling.  Social History: Former smoker. Quit in 1996. 40 pack-years. Previously housekeeper x 10 years  Past Medical History:  Diagnosis Date   Allergic rhinitis    Arthritis    Breast calcification seen on mammogram    Right breast   Breast cancer (HCC)    COPD (chronic obstructive pulmonary disease) (HCC)    GERD (gastroesophageal reflux disease)    Hyperglycemia    Hyperlipidemia    Hypertension    Low back pain    On home oxygen therapy    all the time   Osteopenia    Peripheral edema    Shortness of breath    Subclavian artery stenosis, left (HCC)    Vitamin D deficiency    Wears dentures    top   Wears glasses    reading     Family History  Problem Relation Age of Onset   Emphysema Mother    Emphysema Maternal Grandfather      Social History   Occupational History   Occupation: retired    Comment: Programmer, applications  Tobacco Use   Smoking status: Former    Current packs/day: 0.00    Average packs/day: 1 pack/day for  40.0 years (40.0 ttl pk-yrs)    Types: Cigarettes    Start date: 02/14/1954    Quit date: 02/14/1994    Years since quitting: 29.2   Smokeless tobacco: Never  Vaping Use   Vaping status: Never Used  Substance and Sexual Activity   Alcohol use: No   Drug use: No   Sexual activity: Not on file    No Known Allergies   Outpatient Medications Prior to Visit  Medication Sig Dispense Refill   acetaminophen (TYLENOL) 325 MG tablet Take 650 mg by mouth every 6 (six) hours as needed  for mild pain or headache.      albuterol (PROVENTIL) (2.5 MG/3ML) 0.083% nebulizer solution Take 3 mLs (2.5 mg total) by nebulization 3 (three) times daily for 3 days, THEN 3 mLs (2.5 mg total) every 6 (six) hours as needed for wheezing or shortness of breath. 90 mL 0   apixaban (ELIQUIS) 2.5 MG TABS tablet TAKE ONE TABLET TWICE DAILY 60 tablet 5   Cholecalciferol (VITAMIN D) 2000 UNITS CAPS Take 2,000 Units by mouth daily.      Cyanocobalamin (VITAMIN B 12 PO) Take 1 tablet by mouth daily.     furosemide (LASIX) 40 MG tablet Take 0.5 tablets (20 mg total) by mouth 2 (two) times daily. <PLEASE MAKE APPOINTMENT FOR REFILLS> 60 tablet 1   loratadine (CLARITIN) 10 MG tablet Take 1 tablet (10 mg total) by mouth daily. 30 tablet 0   metoprolol tartrate (LOPRESSOR) 25 MG tablet Take 1 tablet (25 mg total) by mouth 2 (two) times daily. (Patient taking differently: Take 37.5 mg by mouth 2 (two) times daily.) 60 tablet 0   PROAIR HFA 108 (90 Base) MCG/ACT inhaler TAKE 2 PUFFS BY MOUTH EVERY 6 HOURS AS NEEDED FOR WHEEZE OR SHORTNESS OF BREATH (Patient taking differently: Inhale 2 puffs into the lungs every 6 (six) hours as needed for wheezing or shortness of breath.) 8.5 Inhaler 11   rosuvastatin (CRESTOR) 40 MG tablet Take 40 mg by mouth daily.     TRELEGY ELLIPTA 200-62.5-25 MCG/ACT AEPB INHALE ONE PUFF into lungs DAILY 60 each 5   fluticasone (FLONASE) 50 MCG/ACT nasal spray Place 2 sprays into both nostrils daily for 10 days. 16 g 0   pantoprazole (PROTONIX) 40 MG tablet Take 1 tablet (40 mg total) by mouth daily at 6 (six) AM. (Patient not taking: Reported on 04/27/2023) 30 tablet 0   No facility-administered medications prior to visit.    Review of Systems  Constitutional:  Negative for chills, diaphoresis, fever, malaise/fatigue and weight loss.  HENT:  Negative for congestion.   Respiratory:  Positive for cough, sputum production and shortness of breath. Negative for hemoptysis and wheezing.    Cardiovascular:  Positive for leg swelling. Negative for chest pain and palpitations.     Objective:   Vitals:   04/27/23 1111  BP: 122/84  Pulse: 87  SpO2: 95%  Weight: 152 lb (68.9 kg)  Height: 5' (1.524 m)   SpO2: 95 %  Physical Exam: General: Elderly, frail-appearing, no acute distress HENT: [place], AT Eyes: EOMI, no scleral icterus Respiratory: Diminished but clear to auscultation bilaterally.  No crackles, wheezing or rales Cardiovascular: RRR, -M/R/G, no JVD Extremities:Non pitting edema,-tenderness Neuro: AAO x4, CNII-XII grossly intact Psych: Normal mood, normal affect  Data Reviewed:  Imaging: CXR 02/15/21 - Hyperinflation. Unchanged basilar densities R>L CXR 05/03/22 - Increased right pleural effusion CXR 04/27/23 - on my read, my significant infiltrate, effusion or edema  PFT: 07/17/17  FVC 1.3 (60%) FEV1 0.5 (31%) Ratio 38  Interpretation: Very severe obstructive defect  Labs: CBC    Component Value Date/Time   WBC 6.8 04/17/2023 1329   WBC 11.0 (H) 02/16/2023 0802   RBC 3.74 (L) 04/17/2023 1329   HGB 11.3 (L) 04/17/2023 1329   HGB 9.6 (L) 11/07/2016 0932   HCT 37.2 04/17/2023 1329   HCT 31.0 (L) 11/07/2016 0932   PLT 244 04/17/2023 1329   PLT 205 11/07/2016 0932   MCV 99.5 04/17/2023 1329   MCV 96.6 11/07/2016 0932   MCH 30.2 04/17/2023 1329   MCHC 30.4 04/17/2023 1329   RDW 14.8 04/17/2023 1329   RDW 13.4 11/07/2016 0932   LYMPHSABS 1.0 04/17/2023 1329   LYMPHSABS 1.3 11/07/2016 0932   MONOABS 0.5 04/17/2023 1329   MONOABS 0.4 11/07/2016 0932   EOSABS 0.4 04/17/2023 1329   EOSABS 0.2 11/07/2016 0932   BASOSABS 0.0 04/17/2023 1329   BASOSABS 0.0 11/07/2016 0932       Assessment & Plan:   Discussion: 89 year old female former smoker with COPD, chronic respiratory failure, chronic diastolic heart failure, moderate PH, HTN, atrial fibrillation, subclavian arterial stenosis, HLD, hx breast cancer who presents for annual follow-up and hospital  follow-up. Reviewed hospital course. Currently in exacerbation. Ambulatory O2 with desaturations but no change in baseline O2 requirements. Appears euvolemic. Discussed clinical course and management of COPD including bronchodilator regimen, preventive care and action plan for exacerbation.   COPD exacerbation Prednisone taper ordered CXR ordered. Will call if antibiotics needed CONTINUE Trelegy 200 ONE puff ONCE a day. Rinse mouth out after use CONTINUE Albuterol AS NEEDED for shortness of breath or wheezing.  Chronic hypoxemic respiratory failure Ambulatory O2 with desaturations CONTINUE 4L oxygen via nasal cannula with activity and sleep. RECERTIFIED today Goal for SpO2 >88%  Chronic diastolic heart failure Lower extremity swelling Take additional 1/2 pill (20 mg) for increased weight of 3 lbs in a day or 5 lbs in a week If you are taking this frequently, please contact your cardiologist   Health Maintenance Immunization History  Administered Date(s) Administered   Fluad Quad(high Dose 65+) 10/15/2018   Influenza Split 11/14/2001, 11/15/2002, 01/15/2004, 12/15/2004, 11/14/2005, 11/28/2008, 11/23/2012, 11/14/2014, 11/21/2016, 12/17/2019, 10/28/2020   Influenza, High Dose Seasonal PF 11/16/2015, 11/10/2016   Influenza,inj,Quad PF,6+ Mos 11/11/2013   Influenza-Unspecified 11/17/2017   Moderna Sars-Covid-2 Vaccination 04/13/2019, 05/11/2019, 12/16/2019, 10/28/2020   Pneumococcal Conjugate-13 11/25/2013, 12/11/2013   Pneumococcal Polysaccharide-23 11/15/2002, 07/26/2016   Td 04/05/2001   Tdap 11/10/2011   Zoster, Live 02/26/2020, 08/26/2020   CT Lung Screen - not qualified due to age and >15 years since tobacco use  Orders Placed This Encounter  Procedures   DG Chest 2 View    Standing Status:   Future    Number of Occurrences:   1    Expiration Date:   04/26/2024    Reason for Exam (SYMPTOM  OR DIAGNOSIS REQUIRED):   hypoxemia    Preferred [facility] location?:   [facility]   Meds ordered this encounter  Medications   predniSONE (DELTASONE) 10 MG tablet    Sig: Take 4 tablets (40 mg total) by mouth daily with breakfast for 2 days, THEN 3 tablets (30 mg total) daily with breakfast for 2 days, THEN 2 tablets (20 mg total) daily with breakfast for 2 days, THEN 1 tablet (10 mg total) daily with breakfast for 2 days.    Dispense:  20 tablet    Refill:  0  Fluticasone-Umeclidin-Vilant (TRELEGY ELLIPTA) 200-62.5-25 MCG/ACT AEPB    Sig: Inhale 1 puff into the lungs daily.    Dispense:  60 each    Refill:  11   albuterol (VENTOLIN HFA) 108 (90 Base) MCG/ACT inhaler    Sig: Inhale 2 puffs into the lungs every 6 (six) hours as needed for wheezing or shortness of breath.    Dispense:  8 g    Refill:  6    Return in about 6 months (around 10/28/2023).  I have spent a total time of 45-minutes on the day of the appointment including chart review, data review, collecting history, coordinating care and discussing medical diagnosis and plan with the patient/family. Past medical history, allergies, medications were reviewed. Pertinent imaging, labs and tests included in this note have been reviewed and interpreted independently by me.  Phillip Maffei Mechele Collin, MD [facility] 04/27/2023 11:42 AM  Office Number [phone]

## 2023-05-24 ENCOUNTER — Ambulatory Visit (HOSPITAL_BASED_OUTPATIENT_CLINIC_OR_DEPARTMENT_OTHER): Admitting: Pulmonary Disease

## 2023-05-31 ENCOUNTER — Inpatient Hospital Stay

## 2023-05-31 ENCOUNTER — Inpatient Hospital Stay: Attending: Hematology

## 2023-05-31 VITALS — BP 161/48 | HR 113 | Temp 98.2°F | Resp 18

## 2023-05-31 DIAGNOSIS — D638 Anemia in other chronic diseases classified elsewhere: Secondary | ICD-10-CM

## 2023-05-31 DIAGNOSIS — D631 Anemia in chronic kidney disease: Secondary | ICD-10-CM | POA: Insufficient documentation

## 2023-05-31 DIAGNOSIS — I129 Hypertensive chronic kidney disease with stage 1 through stage 4 chronic kidney disease, or unspecified chronic kidney disease: Secondary | ICD-10-CM | POA: Insufficient documentation

## 2023-05-31 DIAGNOSIS — Z79899 Other long term (current) drug therapy: Secondary | ICD-10-CM | POA: Insufficient documentation

## 2023-05-31 DIAGNOSIS — N184 Chronic kidney disease, stage 4 (severe): Secondary | ICD-10-CM | POA: Diagnosis not present

## 2023-05-31 DIAGNOSIS — Z170 Estrogen receptor positive status [ER+]: Secondary | ICD-10-CM

## 2023-05-31 LAB — CBC WITH DIFFERENTIAL (CANCER CENTER ONLY)
Abs Immature Granulocytes: 0.02 10*3/uL (ref 0.00–0.07)
Basophils Absolute: 0 10*3/uL (ref 0.0–0.1)
Basophils Relative: 1 %
Eosinophils Absolute: 0.2 10*3/uL (ref 0.0–0.5)
Eosinophils Relative: 4 %
HCT: 33.5 % — ABNORMAL LOW (ref 36.0–46.0)
Hemoglobin: 10.4 g/dL — ABNORMAL LOW (ref 12.0–15.0)
Immature Granulocytes: 0 %
Lymphocytes Relative: 21 %
Lymphs Abs: 1.2 10*3/uL (ref 0.7–4.0)
MCH: 30.2 pg (ref 26.0–34.0)
MCHC: 31 g/dL (ref 30.0–36.0)
MCV: 97.4 fL (ref 80.0–100.0)
Monocytes Absolute: 0.4 10*3/uL (ref 0.1–1.0)
Monocytes Relative: 8 %
Neutro Abs: 3.7 10*3/uL (ref 1.7–7.7)
Neutrophils Relative %: 66 %
Platelet Count: 218 10*3/uL (ref 150–400)
RBC: 3.44 MIL/uL — ABNORMAL LOW (ref 3.87–5.11)
RDW: 13.2 % (ref 11.5–15.5)
WBC Count: 5.5 10*3/uL (ref 4.0–10.5)
nRBC: 0 % (ref 0.0–0.2)

## 2023-05-31 LAB — FERRITIN: Ferritin: 127 ng/mL (ref 11–307)

## 2023-05-31 MED ORDER — EPOETIN ALFA-EPBX 10000 UNIT/ML IJ SOLN
10000.0000 [IU] | Freq: Once | INTRAMUSCULAR | Status: AC
  Administered 2023-05-31: 10000 [IU] via SUBCUTANEOUS
  Filled 2023-05-31: qty 1

## 2023-06-05 DIAGNOSIS — N1832 Chronic kidney disease, stage 3b: Secondary | ICD-10-CM | POA: Diagnosis not present

## 2023-06-05 DIAGNOSIS — R54 Age-related physical debility: Secondary | ICD-10-CM | POA: Diagnosis not present

## 2023-06-05 DIAGNOSIS — R600 Localized edema: Secondary | ICD-10-CM | POA: Diagnosis not present

## 2023-06-05 DIAGNOSIS — J9611 Chronic respiratory failure with hypoxia: Secondary | ICD-10-CM | POA: Diagnosis not present

## 2023-06-05 DIAGNOSIS — I5032 Chronic diastolic (congestive) heart failure: Secondary | ICD-10-CM | POA: Diagnosis not present

## 2023-06-28 ENCOUNTER — Encounter: Payer: Self-pay | Admitting: Cardiovascular Disease

## 2023-06-28 ENCOUNTER — Ambulatory Visit: Attending: Cardiovascular Disease | Admitting: Cardiovascular Disease

## 2023-06-28 VITALS — BP 154/60 | HR 64 | Ht 60.0 in | Wt 154.0 lb

## 2023-06-28 DIAGNOSIS — I5032 Chronic diastolic (congestive) heart failure: Secondary | ICD-10-CM

## 2023-06-28 DIAGNOSIS — E782 Mixed hyperlipidemia: Secondary | ICD-10-CM | POA: Diagnosis not present

## 2023-06-28 DIAGNOSIS — I350 Nonrheumatic aortic (valve) stenosis: Secondary | ICD-10-CM

## 2023-06-28 DIAGNOSIS — I771 Stricture of artery: Secondary | ICD-10-CM | POA: Diagnosis not present

## 2023-06-28 DIAGNOSIS — I10 Essential (primary) hypertension: Secondary | ICD-10-CM

## 2023-06-28 DIAGNOSIS — I4819 Other persistent atrial fibrillation: Secondary | ICD-10-CM | POA: Diagnosis not present

## 2023-06-28 DIAGNOSIS — R600 Localized edema: Secondary | ICD-10-CM

## 2023-06-28 NOTE — Assessment & Plan Note (Signed)
 History of asymptomatic left subclavian stenosis which we are managing conservatively.

## 2023-06-28 NOTE — Patient Instructions (Signed)

## 2023-06-28 NOTE — Progress Notes (Signed)
 06/28/2023 [name]   11/24/1933  [id number]  Primary Physician Arva Lathe, MD Primary Cardiologist: Avanell Leigh MD Lathan Poke, [place], [place]  HPI:  Courtney Cline is a 89 y.o.  married Caucasian female mother of 2, grandmother 4 grandchildren  was initially referred to me by Dr. Berneda Bridges for peripheral vascular evaluation because of presumed left subclavian artery stenosis and/or steal. I last saw her in the office 04/12/2022.  She is accompanied by her son Courtney Cline.  Apparently her husband Courtney Cline is also a patient of mine.. Her cardiac risk factor profile is notable for remote tobacco abuse having quit 22 years ago, treated hypertension, and hyperlipidemia. She has never had a heart attack or stroke. She was a symptomatically with regards to left upper extremity claudication or dizziness and therefore continue conservative treatment of her subclavian artery stenosis was recommended at that time. She has noticed increasing bilateral lower extremity edema over the last one month which has improved somewhat with the addition of an oral diuretic. She does admit to dietary indiscretion. She is on chronic home O2 for her COPD and has not noticed any increasing shortness of breath.   Since I saw her a year ago she remained stable.  She does have trace lower extreme edema on oral diuretics.  She is on chronic O2 for COPD.  She currently is in sinus rhythm today on Eliquis  oral anticoagulation.  She denies chest pain.  She was hospitalized in January of this year for COPD exacerbation.   Current Meds  Medication Sig   acetaminophen  (TYLENOL ) 325 MG tablet Take 650 mg by mouth every 6 (six) hours as needed for mild pain or headache.    albuterol  (PROVENTIL ) (2.5 MG/3ML) 0.083% nebulizer solution Take 3 mLs (2.5 mg total) by nebulization 3 (three) times daily for 3 days, THEN 3 mLs (2.5 mg total) every 6 (six) hours as needed for wheezing or shortness of breath.    albuterol  (VENTOLIN  HFA) 108 (90 Base) MCG/ACT inhaler Inhale 2 puffs into the lungs every 6 (six) hours as needed for wheezing or shortness of breath.   apixaban  (ELIQUIS ) 2.5 MG TABS tablet TAKE ONE TABLET TWICE DAILY   Cholecalciferol  (VITAMIN D ) 2000 UNITS CAPS Take 2,000 Units by mouth daily.    Cyanocobalamin  (VITAMIN B 12 PO) Take 1 tablet by mouth daily.   Fluticasone -Umeclidin-Vilant (TRELEGY ELLIPTA ) 200-62.5-25 MCG/ACT AEPB Inhale 1 puff into the lungs daily.   furosemide  (LASIX ) 40 MG tablet Take 0.5 tablets (20 mg total) by mouth 2 (two) times daily. <PLEASE MAKE APPOINTMENT FOR REFILLS>   loratadine  (CLARITIN ) 10 MG tablet Take 1 tablet (10 mg total) by mouth daily.   metoprolol  tartrate (LOPRESSOR ) 25 MG tablet Take 1 tablet (25 mg total) by mouth 2 (two) times daily. (Patient taking differently: Take 37.5 mg by mouth 2 (two) times daily.)   PROAIR  HFA 108 (90 Base) MCG/ACT inhaler TAKE 2 PUFFS BY MOUTH EVERY 6 HOURS AS NEEDED FOR WHEEZE OR SHORTNESS OF BREATH (Patient taking differently: Inhale 2 puffs into the lungs every 6 (six) hours as needed for wheezing or shortness of breath.)   rosuvastatin  (CRESTOR ) 40 MG tablet Take 40 mg by mouth daily.     No Known Allergies  Social History   Socioeconomic History   Marital status: Married    Spouse name: Courtney Cline   Number of children: 2   Years of education: Not on file   Highest education level: Not on file  Occupational History   Occupation: retired    Comment: Programmer, applications  Tobacco Use   Smoking status: Former    Current packs/day: 0.00    Average packs/day: 1 pack/day for 40.0 years (40.0 ttl pk-yrs)    Types: Cigarettes    Start date: 02/14/1954    Quit date: 02/14/1994    Years since quitting: 29.3   Smokeless tobacco: Never  Vaping Use   Vaping status: Never Used  Substance and Sexual Activity   Alcohol  use: No   Drug use: No   Sexual activity: Not on file  Other Topics Concern   Not on file  Social History  Narrative   Not on file   Social Drivers of Health   Financial Resource Strain: Not on file  Food Insecurity: No Food Insecurity (02/14/2023)   Hunger Vital Sign    Worried About Running Out of Food in the Last Year: Never true    Ran Out of Food in the Last Year: Never true  Transportation Needs: No Transportation Needs (02/14/2023)   PRAPARE - Administrator, Civil Service (Medical): No    Lack of Transportation (Non-Medical): No  Physical Activity: Not on file  Stress: Not on file  Social Connections: Socially Isolated (02/14/2023)   Social Connection and Isolation Panel [NHANES]    Frequency of Communication with Friends and Family: Once a week    Frequency of Social Gatherings with Friends and Family: Once a week    Attends Religious Services: Never    Database administrator or Organizations: No    Attends Banker Meetings: Never    Marital Status: Married  Catering manager Violence: Not At Risk (02/14/2023)   Humiliation, Afraid, Rape, and Kick questionnaire    Fear of Current or Ex-Partner: No    Emotionally Abused: No    Physically Abused: No    Sexually Abused: No     Review of Systems: General: negative for chills, fever, night sweats or weight changes.  Cardiovascular: negative for chest pain, dyspnea on exertion, edema, orthopnea, palpitations, paroxysmal nocturnal dyspnea or shortness of breath Dermatological: negative for rash Respiratory: negative for cough or wheezing Urologic: negative for hematuria Abdominal: negative for nausea, vomiting, diarrhea, bright red blood per rectum, melena, or hematemesis Neurologic: negative for visual changes, syncope, or dizziness All other systems reviewed and are otherwise negative except as noted above.    Blood pressure (!) 154/60, pulse 64, height 5' (1.524 m), weight 154 lb (69.9 kg), SpO2 97%.  General appearance: alert and no distress Neck: no adenopathy, no carotid bruit, no JVD, supple,  symmetrical, trachea midline, and thyroid  not enlarged, symmetric, no tenderness/mass/nodules Lungs: clear to auscultation bilaterally Heart: regular rate and rhythm, S1, S2 normal, no murmur, click, rub or gallop Extremities: Trace bilateral lower extremity edema Pulses: 2+ and symmetric Skin: Skin color, texture, turgor normal. No rashes or lesions Neurologic: Grossly normal  EKG not performed today      ASSESSMENT AND PLAN:   Subclavian artery stenosis, left (HCC) History of asymptomatic left subclavian stenosis which we are managing conservatively.  Bilateral lower extremity edema Bilateral lower extremity edema on oral diuretics.  She has minimal edema on exam today.  Essential hypertension History of essential hypertension with blood pressure measured today 154/60.  She is on metoprolol .  Hyperlipidemia History of hyperlipidemia on statin therapy with lipid profile performed 05/02/2022 revealing total cholesterol 140, LDL 62 and HDL of 59.  Diastolic CHF (HCC) History of diastolic heart  failure with echo performed 02/16/2021 revealing normal LV systolic function.  Her diastolic parameters were indeterminate although she is on oral diuretics for peripheral edema.  Persistent atrial fibrillation (HCC) History of paroxysmal atrial fibrillation.  She was in sinus rhythm 02/13/2023.  She is on Eliquis .  Aortic stenosis 2D echo performed 02/16/2021 showed normal LV systolic function with moderate aortic stenosis.  Valve area was 1.43 cm with a peak gradient of 25 mmHg.  Given her age and comorbidities and relative lack of symptoms he was decided not to pursue surveillance imaging or intervention.     Avanell Leigh MD FACP,FACC,FAHA, [facility] 06/28/2023 4:10 PM

## 2023-06-28 NOTE — Assessment & Plan Note (Signed)
 History of hyperlipidemia on statin therapy with lipid profile performed 05/02/2022 revealing total cholesterol 140, LDL 62 and HDL of 59.

## 2023-06-28 NOTE — Assessment & Plan Note (Signed)
 Bilateral lower extremity edema on oral diuretics.  She has minimal edema on exam today.

## 2023-06-28 NOTE — Assessment & Plan Note (Signed)
 2D echo performed 02/16/2021 showed normal LV systolic function with moderate aortic stenosis.  Valve area was 1.43 cm with a peak gradient of 25 mmHg.  Given her age and comorbidities and relative lack of symptoms he was decided not to pursue surveillance imaging or intervention.

## 2023-06-28 NOTE — Assessment & Plan Note (Signed)
 History of essential hypertension with blood pressure measured today 154/60.  She is on metoprolol .

## 2023-06-28 NOTE — Assessment & Plan Note (Signed)
 History of diastolic heart failure with echo performed 02/16/2021 revealing normal LV systolic function.  Her diastolic parameters were indeterminate although she is on oral diuretics for peripheral edema.

## 2023-06-28 NOTE — Assessment & Plan Note (Addendum)
 History of paroxysmal atrial fibrillation.  She was in sinus rhythm 02/13/2023.  She is on Eliquis .

## 2023-07-12 ENCOUNTER — Inpatient Hospital Stay

## 2023-07-12 ENCOUNTER — Inpatient Hospital Stay: Attending: Hematology

## 2023-07-12 DIAGNOSIS — R7303 Prediabetes: Secondary | ICD-10-CM | POA: Diagnosis not present

## 2023-07-12 DIAGNOSIS — D631 Anemia in chronic kidney disease: Secondary | ICD-10-CM | POA: Diagnosis not present

## 2023-07-12 DIAGNOSIS — N1832 Chronic kidney disease, stage 3b: Secondary | ICD-10-CM | POA: Diagnosis not present

## 2023-07-12 DIAGNOSIS — I5032 Chronic diastolic (congestive) heart failure: Secondary | ICD-10-CM | POA: Diagnosis not present

## 2023-07-12 DIAGNOSIS — I130 Hypertensive heart and chronic kidney disease with heart failure and stage 1 through stage 4 chronic kidney disease, or unspecified chronic kidney disease: Secondary | ICD-10-CM | POA: Diagnosis not present

## 2023-07-12 DIAGNOSIS — N189 Chronic kidney disease, unspecified: Secondary | ICD-10-CM | POA: Diagnosis not present

## 2023-08-07 DIAGNOSIS — Z136 Encounter for screening for cardiovascular disorders: Secondary | ICD-10-CM | POA: Diagnosis not present

## 2023-08-07 DIAGNOSIS — D638 Anemia in other chronic diseases classified elsewhere: Secondary | ICD-10-CM | POA: Diagnosis not present

## 2023-08-07 DIAGNOSIS — I480 Paroxysmal atrial fibrillation: Secondary | ICD-10-CM | POA: Diagnosis not present

## 2023-08-07 DIAGNOSIS — Z1331 Encounter for screening for depression: Secondary | ICD-10-CM | POA: Diagnosis not present

## 2023-08-07 DIAGNOSIS — E785 Hyperlipidemia, unspecified: Secondary | ICD-10-CM | POA: Diagnosis not present

## 2023-08-07 DIAGNOSIS — M85859 Other specified disorders of bone density and structure, unspecified thigh: Secondary | ICD-10-CM | POA: Diagnosis not present

## 2023-08-07 DIAGNOSIS — Z0000 Encounter for general adult medical examination without abnormal findings: Secondary | ICD-10-CM | POA: Diagnosis not present

## 2023-08-07 DIAGNOSIS — I10 Essential (primary) hypertension: Secondary | ICD-10-CM | POA: Diagnosis not present

## 2023-08-07 DIAGNOSIS — D0511 Intraductal carcinoma in situ of right breast: Secondary | ICD-10-CM | POA: Diagnosis not present

## 2023-08-07 DIAGNOSIS — I5032 Chronic diastolic (congestive) heart failure: Secondary | ICD-10-CM | POA: Diagnosis not present

## 2023-08-07 DIAGNOSIS — I771 Stricture of artery: Secondary | ICD-10-CM | POA: Diagnosis not present

## 2023-08-14 IMAGING — DX DG CHEST 2V
2 series · 2 of 2 positions shown · non-contrast
Comparison: 08/31/2020

CLINICAL DATA: Hemoptysis.  Suspected sepsis.

EXAM:
CHEST - 2 VIEW

[w chest pa]
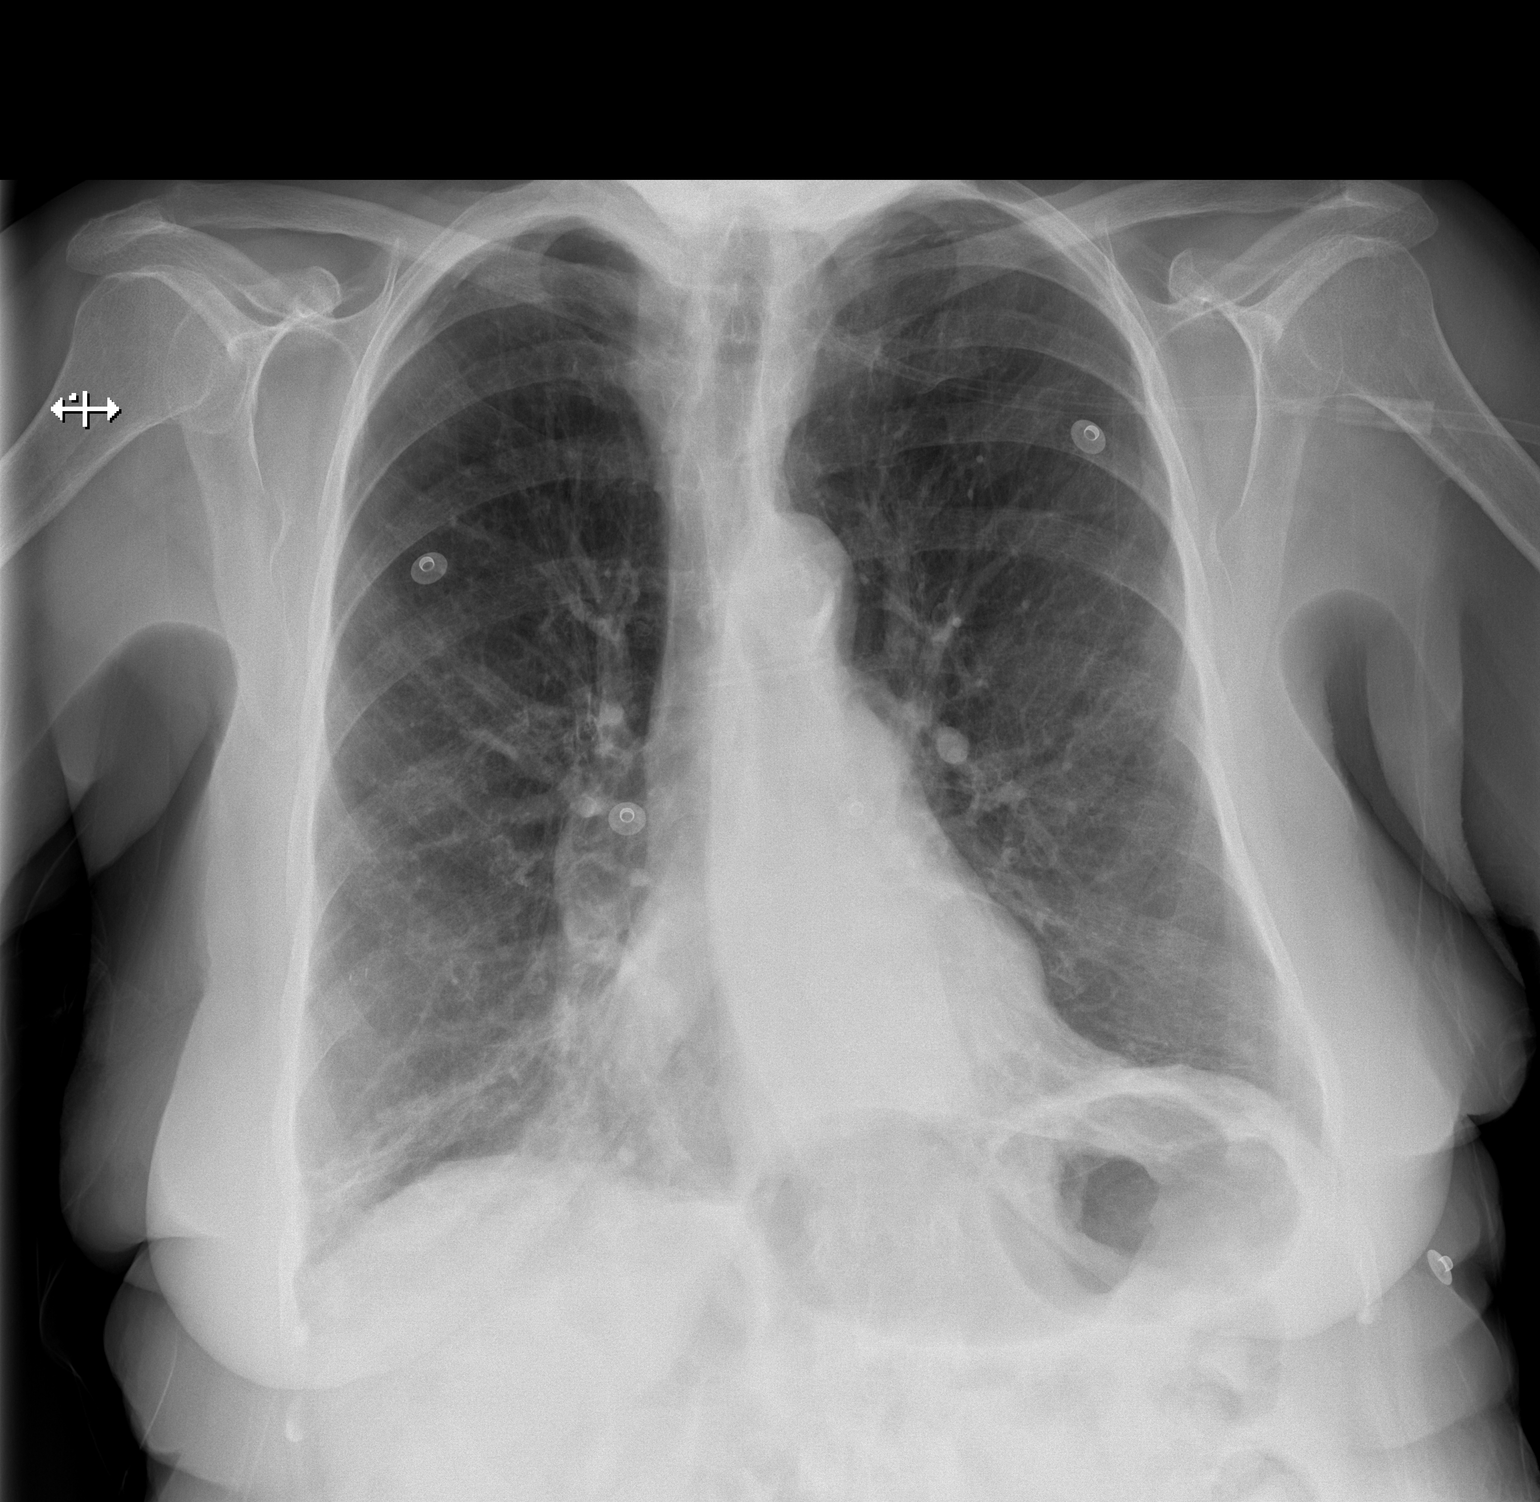

[w chest lat]
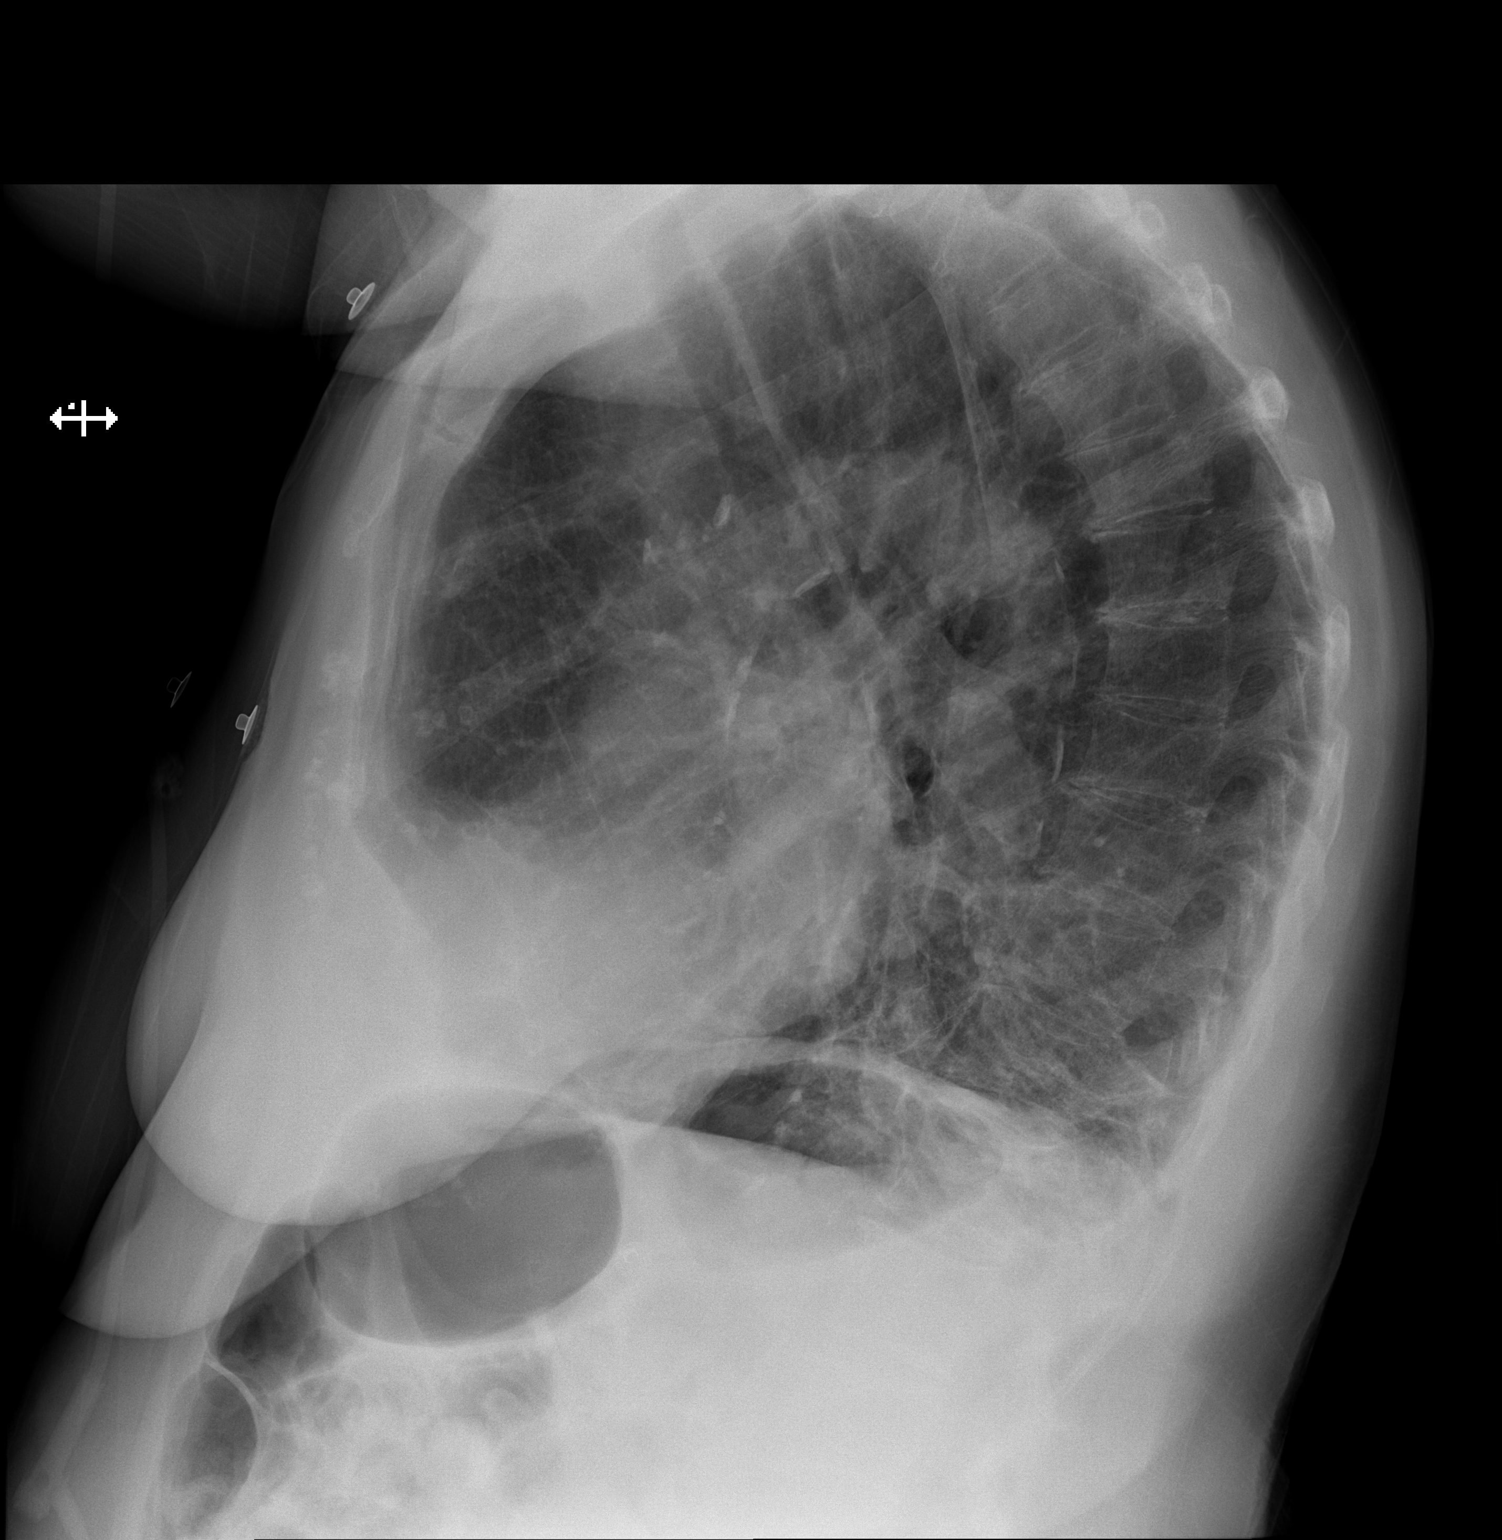

[2 of 2 positions shown; findings below may reference images not displayed]

FINDINGS: Chronic densities at the right lung base. Upper lungs are clear.
Heart and mediastinum are within normal limits. Atherosclerotic
calcifications at the aortic arch. No large pleural effusions.
Chronic hyperinflation with emphysematous changes.
IMPRESSION: 1. No acute cardiopulmonary disease.
2. Emphysema with chronic densities at the right lung base.

## 2023-08-14 IMAGING — US US RENAL
1 series · 14 of 25 positions shown · non-contrast
Comparison: None.

CLINICAL DATA: Acute kidney injury.

EXAM:
RENAL / URINARY TRACT ULTRASOUND COMPLETE

[Series 1: us renal · 31 acquisitions, 14 frames shown]
[im 1/31]
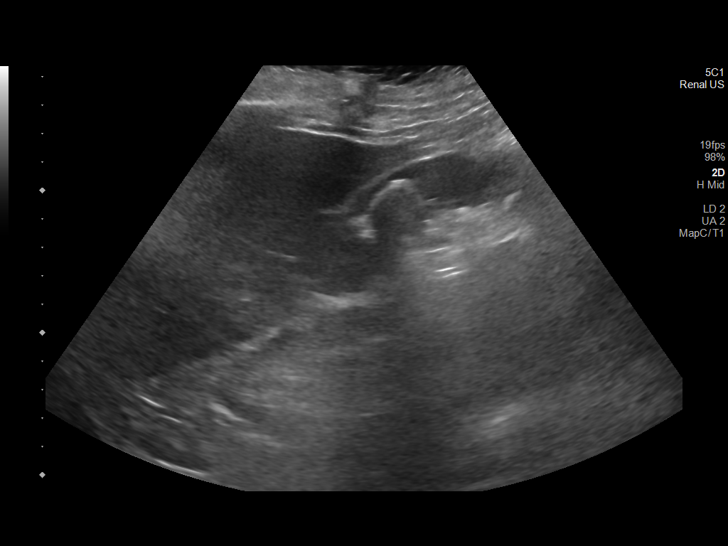
[im 3/31]
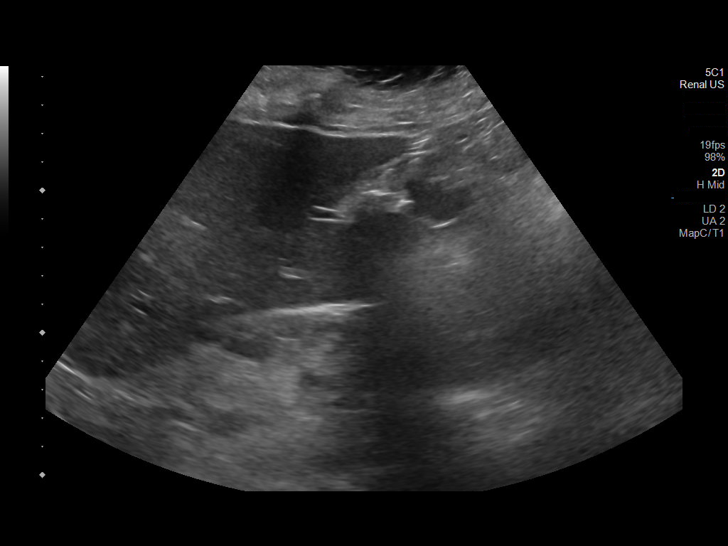
[im 6/31]
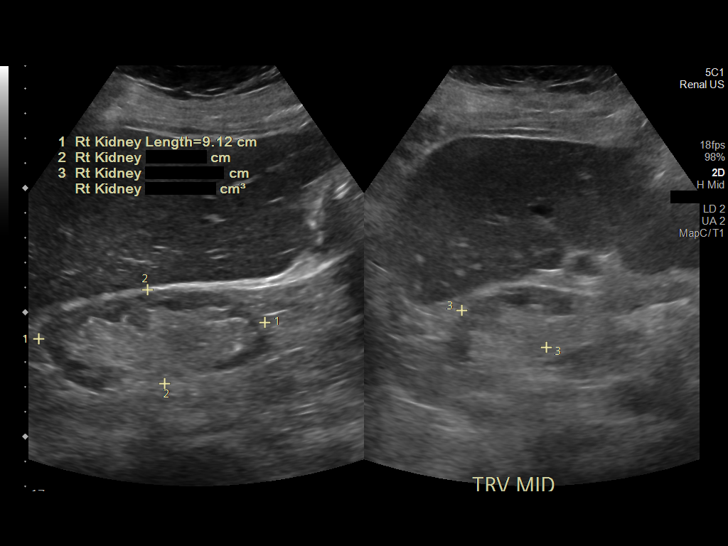
[im 8/31]
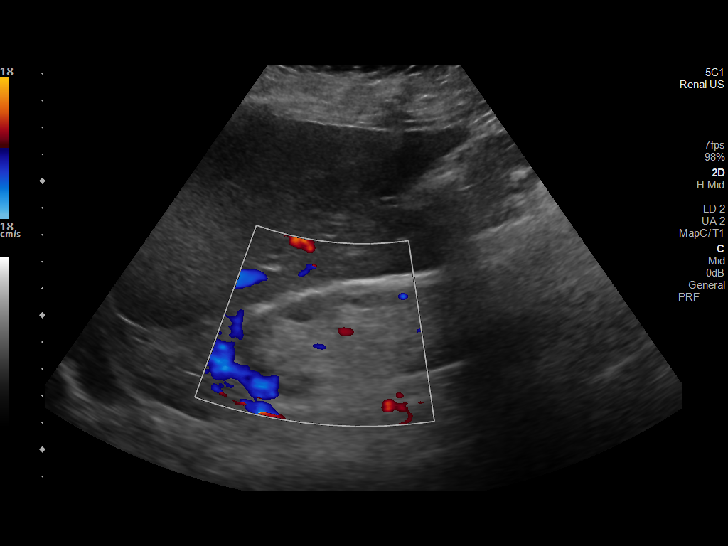
[im 11/31]
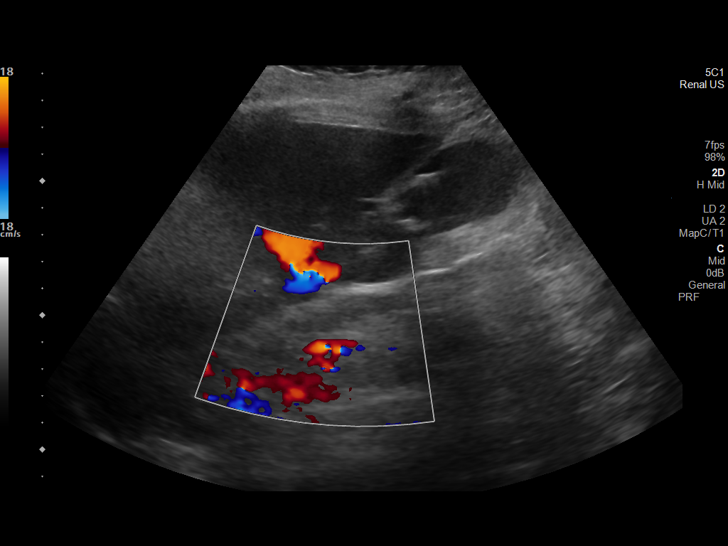
[im 12/31]
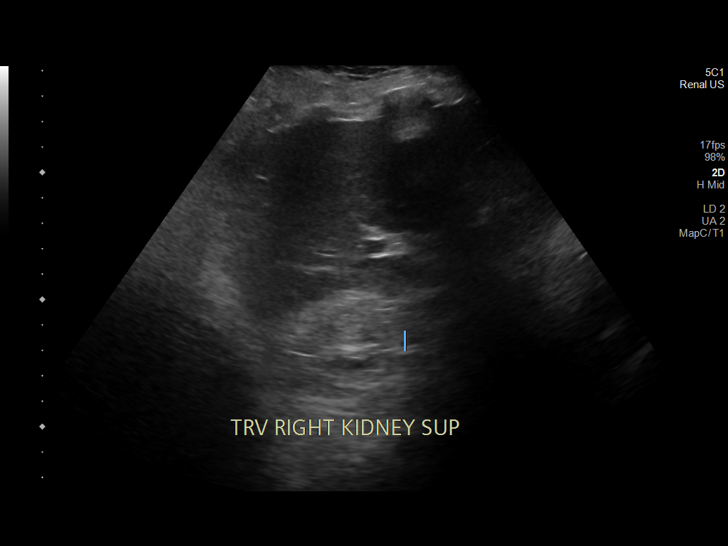
[im 14/31]
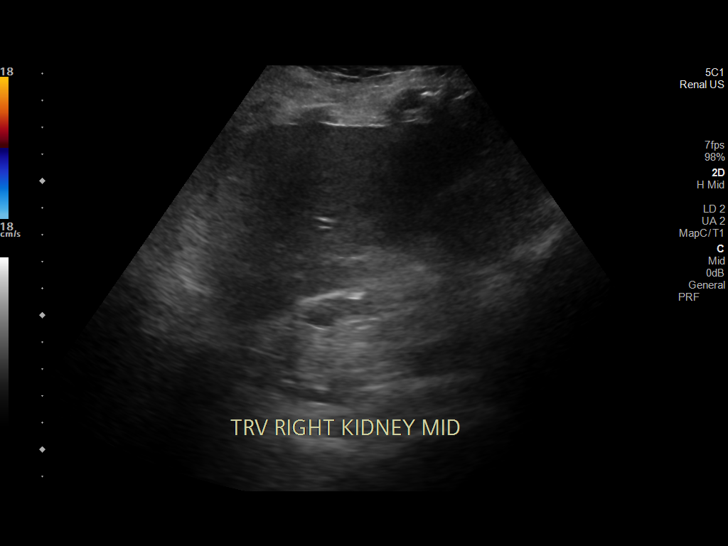
[im 17/31]
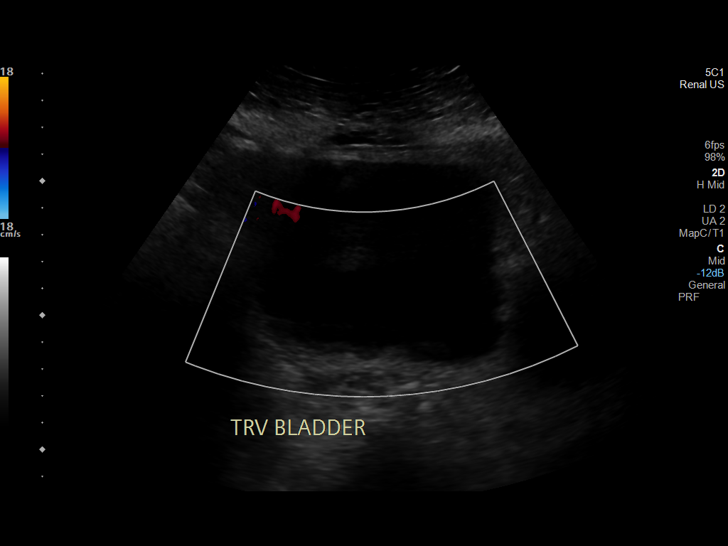
[im 19/31]
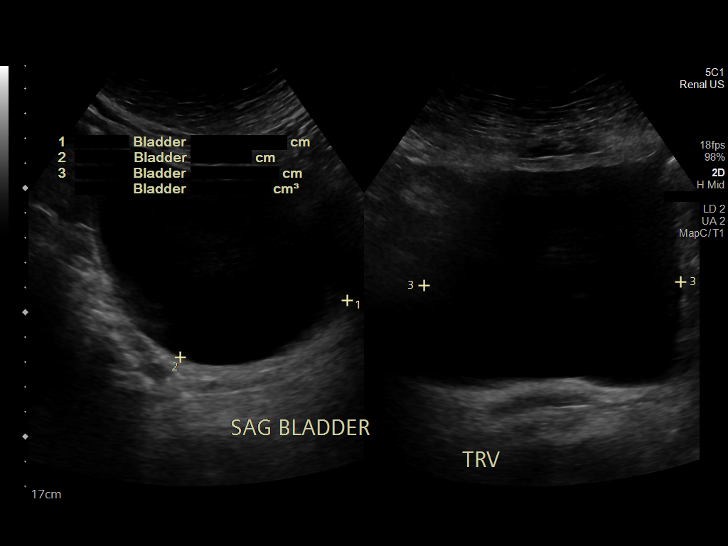
[im 21/31]
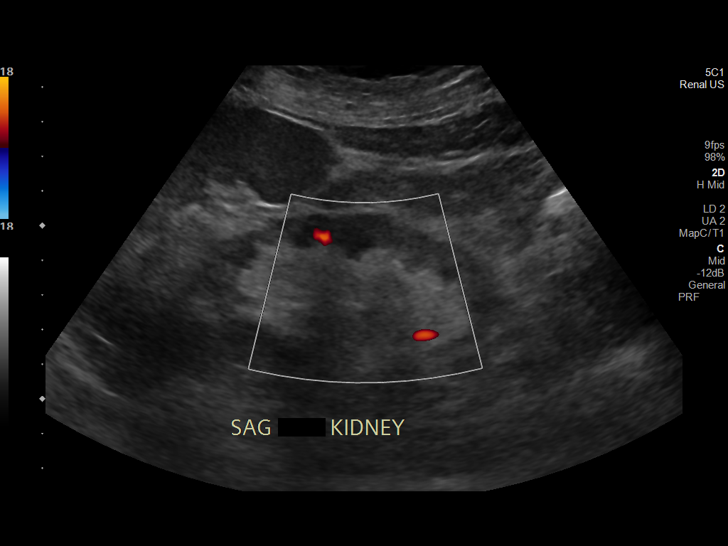
[im 23/31]
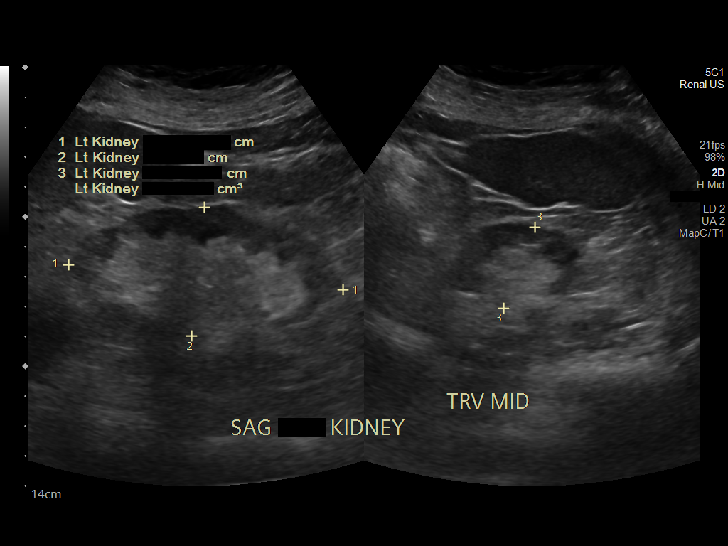
[im 26/31]
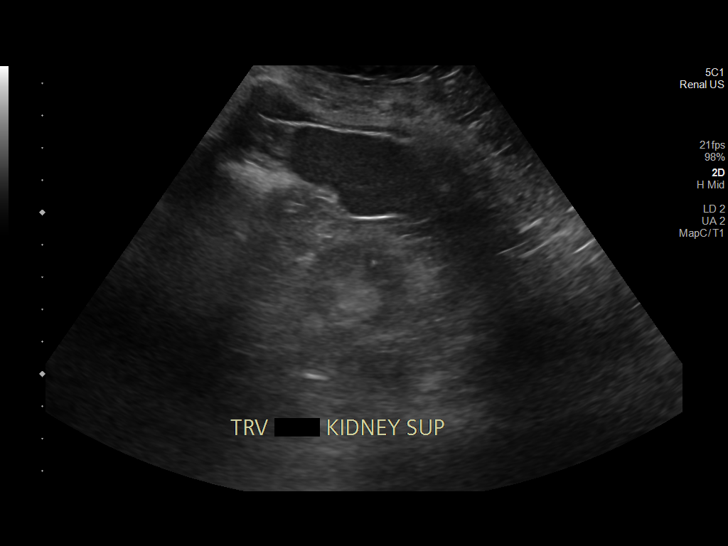
[im 28/31]
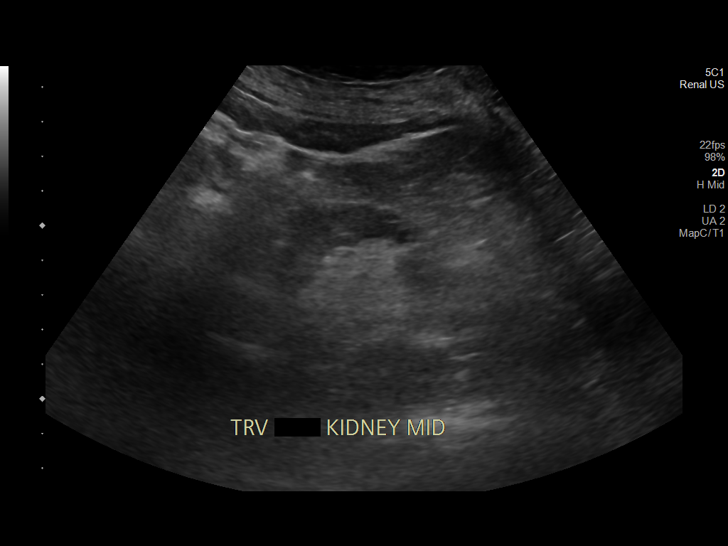
[im 31/31]
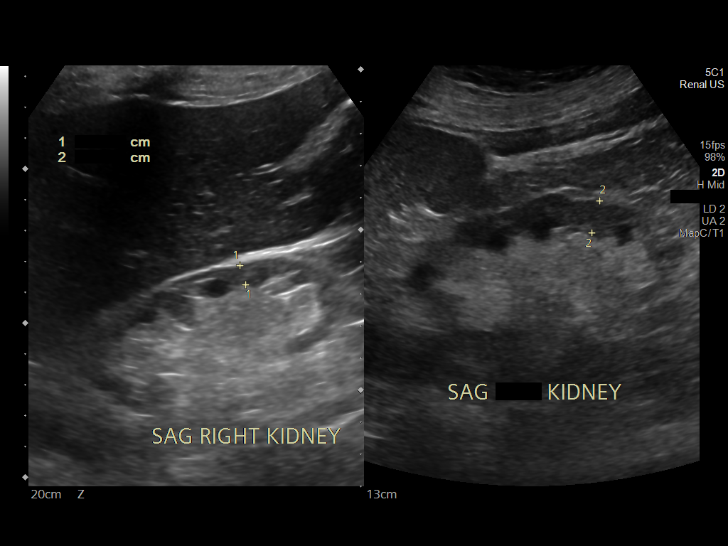

[14 of 25 positions shown; findings below may reference images not displayed]

FINDINGS: Right Kidney:

Renal measurements: 9.1 cm x 3.8 cm x 3.7 cm = volume: 68.03 mL.
Diffusely increased echogenicity of the renal parenchyma is noted
with diffuse renal cortical thinning. No mass or hydronephrosis
visualized.

Left Kidney:

Renal measurements: 9.2 cm x 4.3 cm x 2.9 cm = volume: 60.83 mL.
Echogenicity within normal limits. No mass or hydronephrosis
visualized.

Bladder:

Appears normal for degree of bladder distention.

Other:

Multiple shadowing echogenic gallstones are seen within the
gallbladder.
IMPRESSION: 1. Increased parenchymal echogenicity and cortical thinning of the
right kidney, which may represent sequelae associated with an
inflammatory or infectious process.
2. Cholelithiasis.

## 2023-08-23 ENCOUNTER — Other Ambulatory Visit: Payer: Self-pay

## 2023-08-23 ENCOUNTER — Inpatient Hospital Stay: Attending: Hematology

## 2023-08-23 ENCOUNTER — Inpatient Hospital Stay

## 2023-08-23 DIAGNOSIS — Z170 Estrogen receptor positive status [ER+]: Secondary | ICD-10-CM

## 2023-08-23 DIAGNOSIS — D638 Anemia in other chronic diseases classified elsewhere: Secondary | ICD-10-CM

## 2023-08-23 DIAGNOSIS — D631 Anemia in chronic kidney disease: Secondary | ICD-10-CM | POA: Insufficient documentation

## 2023-08-23 DIAGNOSIS — N184 Chronic kidney disease, stage 4 (severe): Secondary | ICD-10-CM | POA: Insufficient documentation

## 2023-08-23 DIAGNOSIS — I129 Hypertensive chronic kidney disease with stage 1 through stage 4 chronic kidney disease, or unspecified chronic kidney disease: Secondary | ICD-10-CM | POA: Insufficient documentation

## 2023-08-30 ENCOUNTER — Telehealth: Payer: Self-pay | Admitting: Hematology

## 2023-08-30 NOTE — Telephone Encounter (Signed)
 Called patient son to scheduled upcoming appts.

## 2023-09-06 ENCOUNTER — Inpatient Hospital Stay

## 2023-09-06 VITALS — BP 180/55 | HR 61 | Resp 19

## 2023-09-06 DIAGNOSIS — D631 Anemia in chronic kidney disease: Secondary | ICD-10-CM | POA: Diagnosis not present

## 2023-09-06 DIAGNOSIS — D638 Anemia in other chronic diseases classified elsewhere: Secondary | ICD-10-CM

## 2023-09-06 DIAGNOSIS — Z170 Estrogen receptor positive status [ER+]: Secondary | ICD-10-CM

## 2023-09-06 DIAGNOSIS — I129 Hypertensive chronic kidney disease with stage 1 through stage 4 chronic kidney disease, or unspecified chronic kidney disease: Secondary | ICD-10-CM | POA: Diagnosis not present

## 2023-09-06 DIAGNOSIS — N184 Chronic kidney disease, stage 4 (severe): Secondary | ICD-10-CM | POA: Diagnosis not present

## 2023-09-06 LAB — CBC WITH DIFFERENTIAL (CANCER CENTER ONLY)
Abs Immature Granulocytes: 0.01 K/uL (ref 0.00–0.07)
Basophils Absolute: 0 K/uL (ref 0.0–0.1)
Basophils Relative: 1 %
Eosinophils Absolute: 0.2 K/uL (ref 0.0–0.5)
Eosinophils Relative: 4 %
HCT: 29.9 % — ABNORMAL LOW (ref 36.0–46.0)
Hemoglobin: 9.4 g/dL — ABNORMAL LOW (ref 12.0–15.0)
Immature Granulocytes: 0 %
Lymphocytes Relative: 18 %
Lymphs Abs: 1 K/uL (ref 0.7–4.0)
MCH: 30.3 pg (ref 26.0–34.0)
MCHC: 31.4 g/dL (ref 30.0–36.0)
MCV: 96.5 fL (ref 80.0–100.0)
Monocytes Absolute: 0.4 K/uL (ref 0.1–1.0)
Monocytes Relative: 7 %
Neutro Abs: 3.8 K/uL (ref 1.7–7.7)
Neutrophils Relative %: 70 %
Platelet Count: 177 K/uL (ref 150–400)
RBC: 3.1 MIL/uL — ABNORMAL LOW (ref 3.87–5.11)
RDW: 13.5 % (ref 11.5–15.5)
WBC Count: 5.4 K/uL (ref 4.0–10.5)
nRBC: 0 % (ref 0.0–0.2)

## 2023-09-06 MED ORDER — EPOETIN ALFA-EPBX 10000 UNIT/ML IJ SOLN
10000.0000 [IU] | Freq: Once | INTRAMUSCULAR | Status: AC
  Administered 2023-09-06: 10000 [IU] via SUBCUTANEOUS
  Filled 2023-09-06: qty 1

## 2023-09-06 NOTE — Progress Notes (Signed)
 Pt's BP 180/55. Parameters for Retacrit  indicate to give if SBP<180. Powell Lessen, NP notified. Verbal order ok to give injection received.

## 2023-09-07 LAB — FERRITIN: Ferritin: 126 ng/mL (ref 11–307)

## 2023-10-03 ENCOUNTER — Other Ambulatory Visit: Payer: Self-pay

## 2023-10-03 DIAGNOSIS — D638 Anemia in other chronic diseases classified elsewhere: Secondary | ICD-10-CM

## 2023-10-03 DIAGNOSIS — Z170 Estrogen receptor positive status [ER+]: Secondary | ICD-10-CM

## 2023-10-04 ENCOUNTER — Encounter: Payer: Self-pay | Admitting: Hematology

## 2023-10-04 ENCOUNTER — Inpatient Hospital Stay (HOSPITAL_BASED_OUTPATIENT_CLINIC_OR_DEPARTMENT_OTHER): Admitting: Hematology

## 2023-10-04 ENCOUNTER — Inpatient Hospital Stay

## 2023-10-04 ENCOUNTER — Inpatient Hospital Stay: Attending: Hematology

## 2023-10-04 VITALS — BP 171/68 | HR 65 | Temp 97.4°F | Resp 17 | Ht 60.0 in | Wt 157.1 lb

## 2023-10-04 DIAGNOSIS — D631 Anemia in chronic kidney disease: Secondary | ICD-10-CM | POA: Diagnosis not present

## 2023-10-04 DIAGNOSIS — Z170 Estrogen receptor positive status [ER+]: Secondary | ICD-10-CM

## 2023-10-04 DIAGNOSIS — C50211 Malignant neoplasm of upper-inner quadrant of right female breast: Secondary | ICD-10-CM | POA: Diagnosis not present

## 2023-10-04 DIAGNOSIS — D638 Anemia in other chronic diseases classified elsewhere: Secondary | ICD-10-CM

## 2023-10-04 DIAGNOSIS — I129 Hypertensive chronic kidney disease with stage 1 through stage 4 chronic kidney disease, or unspecified chronic kidney disease: Secondary | ICD-10-CM | POA: Insufficient documentation

## 2023-10-04 DIAGNOSIS — N184 Chronic kidney disease, stage 4 (severe): Secondary | ICD-10-CM | POA: Diagnosis not present

## 2023-10-04 LAB — CBC WITH DIFFERENTIAL (CANCER CENTER ONLY)
Abs Immature Granulocytes: 0 K/uL (ref 0.00–0.07)
Basophils Absolute: 0 K/uL (ref 0.0–0.1)
Basophils Relative: 1 %
Eosinophils Absolute: 0.3 K/uL (ref 0.0–0.5)
Eosinophils Relative: 6 %
HCT: 29.6 % — ABNORMAL LOW (ref 36.0–46.0)
Hemoglobin: 9.2 g/dL — ABNORMAL LOW (ref 12.0–15.0)
Immature Granulocytes: 0 %
Lymphocytes Relative: 21 %
Lymphs Abs: 0.9 K/uL (ref 0.7–4.0)
MCH: 30 pg (ref 26.0–34.0)
MCHC: 31.1 g/dL (ref 30.0–36.0)
MCV: 96.4 fL (ref 80.0–100.0)
Monocytes Absolute: 0.3 K/uL (ref 0.1–1.0)
Monocytes Relative: 7 %
Neutro Abs: 2.9 K/uL (ref 1.7–7.7)
Neutrophils Relative %: 65 %
Platelet Count: 193 K/uL (ref 150–400)
RBC: 3.07 MIL/uL — ABNORMAL LOW (ref 3.87–5.11)
RDW: 13.4 % (ref 11.5–15.5)
WBC Count: 4.4 K/uL (ref 4.0–10.5)
nRBC: 0 % (ref 0.0–0.2)

## 2023-10-04 LAB — FERRITIN: Ferritin: 102 ng/mL (ref 11–307)

## 2023-10-04 MED ORDER — EPOETIN ALFA-EPBX 10000 UNIT/ML IJ SOLN
10000.0000 [IU] | Freq: Once | INTRAMUSCULAR | Status: AC
  Administered 2023-10-04: 10000 [IU] via SUBCUTANEOUS
  Filled 2023-10-04: qty 1

## 2023-10-04 NOTE — Assessment & Plan Note (Signed)
-  long standing history of low hgb secondary to CKD -she is on Eliquis  for A.fib -endorses taking oral iron   -prior stool occult test was negative. -she recently presented to her PCP with nosebleeds. She was seen by Dr. Llewellyn in ENT on 07/05/21. -we started her on retacrit  injections on 07/01/21. She also receives IV Venofer  for ferritin <100. She tolerates these well with no side effects.

## 2023-10-04 NOTE — Assessment & Plan Note (Signed)
 -  diagnosed in 10/2013, s/p right lumpectomy. Completed 5 years of anastrozole in 11/2018. -most recent mammogram 08/16/21 was negative.

## 2023-10-04 NOTE — Progress Notes (Signed)
 [facility]   [phone] [phone]   Clinic Follow up Note   Patient Care Team: Elliot Charm, MD as PCP - General (Internal Medicine) Court Dorn PARAS, MD as PCP - Cardiology (Cardiology) Alaine Vicenta NOVAK, MD as Consulting Physician (Pulmonary Disease) Lanny Callander, MD as Consulting Physician (Hematology) Vanderbilt Ned, MD as Consulting Physician (General Surgery)  Date of Service:  10/04/2023  CHIEF COMPLAINT: f/u of anemia  CURRENT THERAPY:  Retacrit  10,000 unit every 6 weeks as needed  Oncology History   Breast cancer of upper-inner quadrant of right female breast [facility]) -diagnosed in 10/2013, s/p right lumpectomy. Completed 5 years of anastrozole  in 11/2018. -most recent mammogram 08/16/21 was negative.    Anemia of chronic disease -long standing history of low hgb secondary to CKD -she is on Eliquis  for A.fib -endorses taking oral iron   -prior stool occult test was negative. -she recently presented to her PCP with nosebleeds. She was seen by Dr. Llewellyn in ENT on 07/05/21. -we started her on retacrit  injections on 07/01/21. She also receives IV Venofer  for ferritin <100. She tolerates these well with no side effects.     Assessment & Plan Anemia of chronic disease Chronic anemia managed with growth factor injections. Hemoglobin levels are well-managed, currently above 9 g/dL, within the target range of 9 to 11 g/dL. She reports no recent episodes of thrombosis. The growth factor injections effectively stimulate erythropoiesis without adverse effects. Injection frequency adjusted from every four weeks to every six weeks due to stable symptoms. Risk of thrombosis increases if hemoglobin exceeds 11 g/dL. Injection frequency will be adjusted if hemoglobin drops below 9 g/dL. - Continue growth factor injections every six weeks. - Monitor hemoglobin levels and adjust injection frequency if levels drop below 9 g/dL.  Plan - Lab  reviewed, will proceed with Retacrit  injection today and continue every 6 weeks - Follow-up in 6 months, or sooner if her anemia gets worse.   SUMMARY OF ONCOLOGIC HISTORY: Oncology History Overview Note  Breast cancer of upper-inner quadrant of right female breast   Staging form: Breast, AJCC 7th Edition     Clinical: Stage Unknown (T1c, NX, cM0) - Signed by Mina West Shells, MD on 11/19/2013       Staging comments: ER 100%+, PR 100%+, Ki-67 7%      Breast cancer of upper-inner quadrant of right female breast (HCC)  12/06/2012 Imaging   Bone Density performed at [facility] physicians T score Lumbar spine -0.1 (-0.7 in 2012), Right neck femur -1.4 (-1.2 before), Left neck femur -1.4 (-1.5 before) and Interpretation is OSTEOPENIA by WHO criteria.    10/11/2013 Mammogram   Abnormal Screening mammogram showing Right Breast mass.   10/23/2013 Breast US    Diagnostic mammogram and US  showed an irregular hypoechoic mass at 9'0 clock position right breast 8 cm from nipple. US  measurement 1.2x0.7x1.1 cm, no axillary adenopathy.   10/23/2013 Initial Biopsy   US  guided biopsy  done with clip placement.   10/23/2013 Pathology Results   Invasive ductal carcinoma (IDC), DCIS, invasive cancer grade 2, ER 100%+, PR 100%+, Ki-67% 7%, HER-2/NEU by CISH No amplification, ratio of her2:cep17 1.00, average her2 copy number per cell 1.95 (HER 2 Negative tumor). Molecular Classification LUMINAL A.   11/01/2013 Surgery   Initial surgical evaluation by Dr Ned Cornett: Not good surgical candidate because of pulmonary status. Recommended anti-estrogen therapy.   11/18/2013 -  Anti-estrogen oral therapy   Anastrozole  1 mg once daily   12/20/2013 Surgery  right breast lumpectomy without SLN biopsy, negative margins    12/20/2013 Pathologic Stage   pT1cNxMx, G2, LVI (-), tumor measures 1.2cm. Surgical margins are negative.     10/18/2016 Imaging   MM DIAG BREAST TOMO BILATERAL 10/18/16 IMPRESSION: No  mammographic evidence of malignancy involving either breast. Expected post lumpectomy changes in the right breast.   11/08/2018 Mammogram   IMPRESSION: No evidence of malignancy in either breast. Lumpectomy changes on the right. RECOMMENDATION: Diagnostic mammogram is suggested in 1 year. (Code:DM-B-01Y)      Discussed the use of AI scribe software for clinical note transcription with the patient, who gave verbal consent to proceed.  History of Present Illness Courtney Cline is an 89 year old female with chronic anemia who presents for follow-up.  She has chronic anemia of disease with hemoglobin levels maintained between 9 and 11 g/dL through erythropoiesis-stimulating agent injections every six weeks. There are no recent episodes of blood clots or adverse effects from the injections. Her energy levels are slightly lower than usual, but her blood counts remain stable.  She experiences headaches and neck aches, particularly at the back of her neck and head. She sleeps on the couch with several pillows propping her up, causing her head to tilt forward.  She is able to cook and clean some at home, although not extensively. Her appetite is good.     All other systems were reviewed with the patient and are negative.  MEDICAL HISTORY:  Past Medical History:  Diagnosis Date   Allergic rhinitis    Arthritis    Breast calcification seen on mammogram    Right breast   Breast cancer (HCC)    COPD (chronic obstructive pulmonary disease) (HCC)    GERD (gastroesophageal reflux disease)    Hyperglycemia    Hyperlipidemia    Hypertension    Low back pain    On home oxygen  therapy    all the time   Osteopenia    Peripheral edema    Shortness of breath    Subclavian artery stenosis, left (HCC)    Vitamin D  deficiency    Wears dentures    top   Wears glasses    reading    SURGICAL HISTORY: Past Surgical History:  Procedure Laterality Date   BREAST BIOPSY     BREAST  LUMPECTOMY     right 2015   BREAST LUMPECTOMY WITH RADIOACTIVE SEED LOCALIZATION Right 12/20/2013   Procedure: RIGHT BREAST LUMPECTOMY WITH RADIOACTIVE SEED LOCALIZATION;  Surgeon: Debby Shipper, MD;  Location: [facility];  Service: General;  Laterality: Right;   CAROTID DUPLEX  2014   CATARACT EXTRACTION     both eyes    I have reviewed the social history and family history with the patient and they are unchanged from previous note.  ALLERGIES:  has no known allergies.  MEDICATIONS:  Current Outpatient Medications  Medication Sig Dispense Refill   acetaminophen  (TYLENOL ) 325 MG tablet Take 650 mg by mouth every 6 (six) hours as needed for mild pain or headache.      albuterol  (PROVENTIL ) (2.5 MG/3ML) 0.083% nebulizer solution Take 3 mLs (2.5 mg total) by nebulization 3 (three) times daily for 3 days, THEN 3 mLs (2.5 mg total) every 6 (six) hours as needed for wheezing or shortness of breath. 90 mL 0   albuterol  (VENTOLIN  HFA) 108 (90 Base) MCG/ACT inhaler Inhale 2 puffs into the lungs every 6 (six) hours as needed for wheezing or shortness of breath. 8  g 6   apixaban  (ELIQUIS ) 2.5 MG TABS tablet TAKE ONE TABLET TWICE DAILY 60 tablet 5   Cholecalciferol  (VITAMIN D ) 2000 UNITS CAPS Take 2,000 Units by mouth daily.      Cyanocobalamin  (VITAMIN B 12 PO) Take 1 tablet by mouth daily.     Fluticasone -Umeclidin-Vilant (TRELEGY ELLIPTA ) 200-62.5-25 MCG/ACT AEPB Inhale 1 puff into the lungs daily. 60 each 11   furosemide  (LASIX ) 40 MG tablet Take 0.5 tablets (20 mg total) by mouth 2 (two) times daily. <PLEASE MAKE APPOINTMENT FOR REFILLS> 60 tablet 1   loratadine  (CLARITIN ) 10 MG tablet Take 1 tablet (10 mg total) by mouth daily. 30 tablet 0   metoprolol  tartrate (LOPRESSOR ) 25 MG tablet Take 1 tablet (25 mg total) by mouth 2 (two) times daily. (Patient taking differently: Take 37.5 mg by mouth 2 (two) times daily.) 60 tablet 0   pantoprazole  (PROTONIX ) 40 MG tablet Take 1 tablet (40  mg total) by mouth daily at 6 (six) AM. (Patient not taking: Reported on 06/28/2023) 30 tablet 0   PROAIR  HFA 108 (90 Base) MCG/ACT inhaler TAKE 2 PUFFS BY MOUTH EVERY 6 HOURS AS NEEDED FOR WHEEZE OR SHORTNESS OF BREATH (Patient taking differently: Inhale 2 puffs into the lungs every 6 (six) hours as needed for wheezing or shortness of breath.) 8.5 Inhaler 11   rosuvastatin  (CRESTOR ) 40 MG tablet Take 40 mg by mouth daily.     No current facility-administered medications for this visit.    PHYSICAL EXAMINATION: ECOG PERFORMANCE STATUS: 2 - Symptomatic, <50% confined to bed  Vitals:   10/04/23 1508 10/04/23 1510  BP: (!) 164/52 (!) 171/68  Pulse: 65   Resp: 17   Temp: (!) 97.4 F (36.3 C)   SpO2: 100%    Wt Readings from Last 3 Encounters:  10/04/23 157 lb 1.6 oz (71.3 kg)  06/28/23 154 lb (69.9 kg)  04/27/23 152 lb (68.9 kg)     GENERAL:alert, no distress and comfortable SKIN: skin color, texture, turgor are normal, no rashes or significant lesions EYES: normal, Conjunctiva are pink and non-injected, sclera clear Musculoskeletal:no cyanosis of digits and no clubbing  NEURO: alert & oriented x 3 with fluent speech, no focal motor/sensory deficits  Physical Exam    LABORATORY DATA:  I have reviewed the data as listed    Latest Ref Rng & Units 10/04/2023    2:32 PM 09/06/2023    3:15 PM 05/31/2023    2:37 PM  CBC  WBC 4.0 - 10.5 K/uL 4.4  5.4  5.5   Hemoglobin 12.0 - 15.0 g/dL 9.2  9.4  89.5   Hematocrit 36.0 - 46.0 % 29.6  29.9  33.5   Platelets 150 - 400 K/uL 193  177  218         Latest Ref Rng & Units 04/17/2023    1:29 PM 02/16/2023    8:02 AM 02/15/2023    7:58 AM  CMP  Glucose 70 - 99 mg/dL 888  897  866   BUN 8 - 23 mg/dL 18  35  25   Creatinine 0.44 - 1.00 mg/dL 8.70  8.45  8.74   Sodium 135 - 145 mmol/L 141  142  142   Potassium 3.5 - 5.1 mmol/L 4.1  3.9  4.0   Chloride 98 - 111 mmol/L 104  104  105   CO2 22 - 32 mmol/L 29  28  29    Calcium  8.9 - 10.3 mg/dL  9.7  9.0  9.1  Total Protein 6.5 - 8.1 g/dL 7.5     Total Bilirubin 0.0 - 1.2 mg/dL 0.5     Alkaline Phos 38 - 126 U/L 72     AST 15 - 41 U/L 13     ALT 0 - 44 U/L 5         RADIOGRAPHIC STUDIES: I have personally reviewed the radiological images as listed and agreed with the findings in the report. No results found.    No orders of the defined types were placed in this encounter.  All questions were answered. The patient knows to call the clinic with any problems, questions or concerns. No barriers to learning was detected. The total time spent in the appointment was 15 minutes, including review of chart and various tests results, discussions about plan of care and coordination of care plan     Onita Mattock, MD 10/04/2023

## 2023-10-05 ENCOUNTER — Other Ambulatory Visit: Payer: Self-pay | Admitting: Hematology

## 2023-10-11 ENCOUNTER — Other Ambulatory Visit: Payer: Self-pay

## 2023-10-11 ENCOUNTER — Emergency Department (HOSPITAL_COMMUNITY)
Admission: EM | Admit: 2023-10-11 | Discharge: 2023-10-11 | Disposition: A | Discharge status: Home / Self Care | Attending: Emergency Medicine | Admitting: Emergency Medicine

## 2023-10-11 ENCOUNTER — Encounter (HOSPITAL_COMMUNITY): Payer: Self-pay | Admitting: Emergency Medicine

## 2023-10-11 ENCOUNTER — Emergency Department (HOSPITAL_COMMUNITY)

## 2023-10-11 DIAGNOSIS — Z7951 Long term (current) use of inhaled steroids: Secondary | ICD-10-CM | POA: Diagnosis not present

## 2023-10-11 DIAGNOSIS — J441 Chronic obstructive pulmonary disease with (acute) exacerbation: Secondary | ICD-10-CM | POA: Insufficient documentation

## 2023-10-11 DIAGNOSIS — R918 Other nonspecific abnormal finding of lung field: Secondary | ICD-10-CM | POA: Diagnosis not present

## 2023-10-11 DIAGNOSIS — Z7901 Long term (current) use of anticoagulants: Secondary | ICD-10-CM | POA: Insufficient documentation

## 2023-10-11 DIAGNOSIS — J449 Chronic obstructive pulmonary disease, unspecified: Secondary | ICD-10-CM | POA: Diagnosis not present

## 2023-10-11 DIAGNOSIS — N189 Chronic kidney disease, unspecified: Secondary | ICD-10-CM | POA: Diagnosis not present

## 2023-10-11 DIAGNOSIS — I509 Heart failure, unspecified: Secondary | ICD-10-CM | POA: Insufficient documentation

## 2023-10-11 DIAGNOSIS — Z79899 Other long term (current) drug therapy: Secondary | ICD-10-CM | POA: Insufficient documentation

## 2023-10-11 DIAGNOSIS — R58 Hemorrhage, not elsewhere classified: Secondary | ICD-10-CM | POA: Diagnosis not present

## 2023-10-11 DIAGNOSIS — J90 Pleural effusion, not elsewhere classified: Secondary | ICD-10-CM | POA: Diagnosis not present

## 2023-10-11 DIAGNOSIS — I130 Hypertensive heart and chronic kidney disease with heart failure and stage 1 through stage 4 chronic kidney disease, or unspecified chronic kidney disease: Secondary | ICD-10-CM | POA: Diagnosis not present

## 2023-10-11 DIAGNOSIS — R040 Epistaxis: Secondary | ICD-10-CM | POA: Diagnosis not present

## 2023-10-11 DIAGNOSIS — R059 Cough, unspecified: Secondary | ICD-10-CM | POA: Diagnosis not present

## 2023-10-11 LAB — CBC WITH DIFFERENTIAL/PLATELET
Abs Immature Granulocytes: 0.04 K/uL (ref 0.00–0.07)
Basophils Absolute: 0 K/uL (ref 0.0–0.1)
Basophils Relative: 0 %
Eosinophils Absolute: 0 K/uL (ref 0.0–0.5)
Eosinophils Relative: 0 %
HCT: 33.7 % — ABNORMAL LOW (ref 36.0–46.0)
Hemoglobin: 10.3 g/dL — ABNORMAL LOW (ref 12.0–15.0)
Immature Granulocytes: 0 %
Lymphocytes Relative: 5 %
Lymphs Abs: 0.6 K/uL — ABNORMAL LOW (ref 0.7–4.0)
MCH: 29.9 pg (ref 26.0–34.0)
MCHC: 30.6 g/dL (ref 30.0–36.0)
MCV: 98 fL (ref 80.0–100.0)
Monocytes Absolute: 0.6 K/uL (ref 0.1–1.0)
Monocytes Relative: 5 %
Neutro Abs: 10.7 K/uL — ABNORMAL HIGH (ref 1.7–7.7)
Neutrophils Relative %: 90 %
Platelets: 206 K/uL (ref 150–400)
RBC: 3.44 MIL/uL — ABNORMAL LOW (ref 3.87–5.11)
RDW: 13.9 % (ref 11.5–15.5)
WBC: 12 K/uL — ABNORMAL HIGH (ref 4.0–10.5)
nRBC: 0 % (ref 0.0–0.2)

## 2023-10-11 LAB — BASIC METABOLIC PANEL WITH GFR
Anion gap: 13 (ref 5–15)
BUN: 23 mg/dL (ref 8–23)
CO2: 27 mmol/L (ref 22–32)
Calcium: 9.4 mg/dL (ref 8.9–10.3)
Chloride: 99 mmol/L (ref 98–111)
Creatinine, Ser: 1.34 mg/dL — ABNORMAL HIGH (ref 0.44–1.00)
GFR, Estimated: 38 mL/min — ABNORMAL LOW (ref >60)
Glucose, Bld: 102 mg/dL — ABNORMAL HIGH (ref 70–99)
Potassium: 4.9 mmol/L (ref 3.5–5.1)
Sodium: 138 mmol/L (ref 135–145)

## 2023-10-11 MED ORDER — SALINE SPRAY 0.65 % NA SOLN
2.0000 | Freq: Once | NASAL | Status: AC
  Administered 2023-10-11: 2 via NASAL
  Filled 2023-10-11: qty 44

## 2023-10-11 MED ORDER — IPRATROPIUM-ALBUTEROL 0.5-2.5 (3) MG/3ML IN SOLN
3.0000 mL | Freq: Once | RESPIRATORY_TRACT | Status: AC
  Administered 2023-10-11: 3 mL via RESPIRATORY_TRACT
  Filled 2023-10-11: qty 3

## 2023-10-11 MED ORDER — AMOXICILLIN-POT CLAVULANATE 875-125 MG PO TABS: 1.0000 | ORAL_TABLET | Freq: Two times a day (BID) | ORAL | 0 refills | Status: DC

## 2023-10-11 MED ORDER — PREDNISONE 10 MG PO TABS: 40.0000 mg | ORAL_TABLET | Freq: Every day | ORAL | 0 refills | Status: AC

## 2023-10-11 MED ORDER — OXYMETAZOLINE HCL 0.05 % NA SOLN
1.0000 | Freq: Once | NASAL | Status: AC
  Administered 2023-10-11: 1 via NASAL
  Filled 2023-10-11: qty 30

## 2023-10-11 NOTE — Discharge Instructions (Addendum)
 Courtney Cline  Thank you for allowing us  to take care of you today.  You came to the Emergency Department today because of several days you have had increased cough, sputum production and sputum purulence which is consistent with having a flare of your COPD.  Additionally because of this you have been blowing your nose more often, and you developed a nosebleed.  You were able to get the nosebleed to stop prior to arrival with pressure.  While in the ED you did have another nosebleed which we stopped with a medication called Afrin which helps constrict the blood vessels in your nose, as well as compression.  We are prescribing 2 medications to treat your COPD exacerbation, prednisone , a steroid that you will take once daily for 4 days, as well as Augmentin , a antibiotic that you will take twice a day for the next 7 days.  Please do not blow your nose for at least the next 4 days, as this is the most likely way that you will have a repeat nosebleed.  You can use the saline mist no spray as often as you want for comfort.  We also recommend using the humidifier on your oxygen  concentrator, as the dry oxygen  dries out your nose and makes nosebleeds more likely.  You can use the Afrin up to twice a day as needed if you feel like you need to blow your nose.  Additionally if you have a repeat nosebleed, we recommend 2 sprays of Afrin in each nostril, and then holding compression for 10 to 15 minutes.  If your nosebleed persists thereafter, please return to the emergency department for care.   To-Do: 1. Please follow-up with your primary doctor within 2 days / as soon as possible.    Please return to the Emergency Department or call 911 if you experience have worsening of your symptoms, or do not get better, difficulty breathing, persistent nosebleed, chest pain, shortness of breath, severe or significantly worsening pain, high fever, severe confusion, pass out or have any reason to think that you need  emergency medical care.   We hope you feel better soon.   Mitzie Later, MD Department of Emergency Medicine [facility]

## 2023-10-11 NOTE — ED Provider Notes (Signed)
 [place] EMERGENCY DEPARTMENT AT [facility] Provider Note   CSN: [id number] Arrival date & time: 10/11/23  1305     History Chief Complaint  Patient presents with   Epistaxis     Epistaxis  HPI: Kania Regnier is a 89 y.o. female with history pertinent COPD on 4 L, recurrent epistaxis, HTN, HLD, CHF, CKD who presents complaining of epistaxis and cough. Patient arrived via EMS accompanied by son and daughter-in-law.  History provided by patient and relative: Son.  No interpreter required during this encounter.  Patient reports that she had a episode of epistaxis from her left nare that lasted approximately 45 minutes.  Reports that it resolved upon compression to her naris that was applied by EMS.  Patient reports that she has these nosebleeds approximately once or twice a year when she has pneumonia or a COPD exacerbation.  Reports that approximately 1 week she has had increased cough, increased sputum production, increased sputum purulence (increased yellowness of sputum).  Patient reports that she has had chills. Patient endorses use of Eliquis  Denies fever, chest pain, nausea, vomiting, diarrhea.  Son reports that patient previously had a humidifier for her oxygen  concentrator, however when the management company of her oxygen  concentrator switched out her oxygen  concentrator they accidentally also took her humidifier, therefore she has not recently been using one.  Patient's recorded medical, surgical, social, medication list and allergies were reviewed in the Snapshot window as part of the initial history.   Prior to Admission medications   Medication Sig Start Date End Date Taking? Authorizing Provider  predniSONE  (DELTASONE ) 10 MG tablet Take 4 tablets (40 mg total) by mouth daily for 4 days. 10/11/23 10/15/23 Yes Rogelia Jerilynn RAMAN, MD  acetaminophen  (TYLENOL ) 325 MG tablet Take 650 mg by mouth every 6 (six) hours as needed for mild pain or headache.      [provider]  albuterol  (PROVENTIL ) (2.5 MG/3ML) 0.083% nebulizer solution Take 3 mLs (2.5 mg total) by nebulization 3 (three) times daily for 3 days, THEN 3 mLs (2.5 mg total) every 6 (six) hours as needed for wheezing or shortness of breath. 02/16/23 02/19/24  Sebastian Toribio GAILS, MD  albuterol  (VENTOLIN  HFA) 108 [phone] Base) MCG/ACT inhaler Inhale 2 puffs into the lungs every 6 (six) hours as needed for wheezing or shortness of breath. 04/27/23   Kassie Acquanetta Bradley, MD  amoxicillin -clavulanate (AUGMENTIN ) 875-125 MG tablet Take 1 tablet by mouth every 12 (twelve) hours. 10/11/23   Rogelia Jerilynn RAMAN, MD  apixaban  (ELIQUIS ) 2.5 MG TABS tablet TAKE ONE TABLET TWICE DAILY 03/20/23   Court Dorn PARAS, MD  Cholecalciferol  (VITAMIN D ) 2000 UNITS CAPS Take 2,000 Units by mouth daily.     [provider]  Cyanocobalamin  (VITAMIN B 12 PO) Take 1 tablet by mouth daily.    [provider]  Fluticasone -Umeclidin-Vilant (TRELEGY ELLIPTA ) 200-62.5-25 MCG/ACT AEPB Inhale 1 puff into the lungs daily. 04/27/23   Kassie Acquanetta Bradley, MD  furosemide  (LASIX ) 40 MG tablet Take 0.5 tablets (20 mg total) by mouth 2 (two) times daily. <PLEASE MAKE APPOINTMENT FOR REFILLS> 02/16/23   Sebastian Toribio GAILS, MD  metoprolol  tartrate (LOPRESSOR ) 25 MG tablet Take 1 tablet (25 mg total) by mouth 2 (two) times daily. Patient taking differently: Take 37.5 mg by mouth 2 (two) times daily. 07/17/19   Gretta Doffing P, DO  PROAIR  HFA 108 (90 Base) MCG/ACT inhaler TAKE 2 PUFFS BY MOUTH EVERY 6 HOURS AS NEEDED FOR WHEEZE OR SHORTNESS OF BREATH  Patient taking differently: Inhale 2 puffs into the lungs every 6 (six) hours as needed for wheezing or shortness of breath. 08/16/17   McQuaid, Douglas B, MD  rosuvastatin  (CRESTOR ) 40 MG tablet Take 40 mg by mouth daily. 02/02/21   [provider]     Allergies: Patient has no known allergies.   Review of Systems   ROS as per HPI  Physical Exam Updated Vital Signs BP (!)  149/59 (BP Location: Left Arm)   Pulse (!) 105   Temp 98.2 F (36.8 C) (Oral)   Resp 20   SpO2 97%  Physical Exam Vitals and nursing note reviewed.  Constitutional:      General: She is not in acute distress.    Appearance: Normal appearance.  HENT:     Head: Normocephalic and atraumatic.     Nose:     Comments: Dried blood at the left naris Eyes:     Extraocular Movements: Extraocular movements intact.  Cardiovascular:     Rate and Rhythm: Normal rate.     Pulses: Normal pulses.  Pulmonary:     Effort: Pulmonary effort is normal. No respiratory distress.     Breath sounds: Wheezing (mild expiratory, diffuse) present.     Comments: Saturating well on 4 L nasal cannula Abdominal:     General: Abdomen is flat.     Palpations: Abdomen is soft.     Tenderness: There is no abdominal tenderness. There is no guarding or rebound.  Skin:    General: Skin is warm and dry.     Capillary Refill: Capillary refill takes less than 2 seconds.  Neurological:     General: No focal deficit present.     Mental Status: She is alert and oriented to person, place, and time.     ED Course/ Medical Decision Making/ A&P    Procedures Epistaxis Management  Date/Time: 10/12/2023 11:23 AM  Performed by: Rogelia Jerilynn RAMAN, MD Authorized by: Rogelia Jerilynn RAMAN, MD   Consent:    Consent obtained:  Verbal   Consent given by:  Patient   Risks, benefits, and alternatives were discussed: yes     Risks discussed:  Bleeding, nasal injury and pain   Alternatives discussed:  No treatment Universal protocol:    Patient identity confirmed:  Verbally with patient Anesthesia:    Anesthesia method:  None Procedure details:    Treatment site:  L anterior   Repair method: Afrin and direct pressure.   Treatment episode: recurring   Post-procedure details:    Assessment:  Bleeding stopped   Procedure completion:  Tolerated well, no immediate complications    Medications Ordered in ED Medications   ipratropium-albuterol  (DUONEB) 0.5-2.5 (3) MG/3ML nebulizer solution 3 mL (3 mLs Nebulization Given 10/11/23 1431)  sodium chloride  (OCEAN) 0.65 % nasal spray 2 spray (2 sprays Each Nare Given 10/11/23 1418)  oxymetazoline  (AFRIN) 0.05 % nasal spray 1 spray (1 spray Each Nare Given 10/11/23 1459)    Medical Decision Making:   Sofiah Lyne is a 89 y.o. female who presents for respiratory symptoms and epistaxis as per above.  Physical exam is pertinent for expiratory wheezing without respiratory distress, saturating well on home 4 L, dried blood at the left nare.   The differential includes but is not limited to COPD exacerbation, pneumonia, pleural effusion, pneumothorax, epistaxis, blood loss anemia, coagulopathy.  Independent historian: Relative: Son  External data reviewed: Radiology: Compared chest x-ray to prior, similar appearance of small pleural effusions.  Notes: Reviewed  patient's prior notes and microbiology, no prior documented Pseudomonas infections, and most recent prior FEV1 31%  Labs: Ordered, Independent interpretation, and Details: BMP without emergent electrolyte derangement.  Mildly elevated creatinine at 1.34 is at patient's baseline which is approximately 1.2-1.4.  CBC with slight nonspecific leukocytosis, stable anemia, no thrombocytopenia.  Radiology: Ordered, Independent interpretation, Details: No focal airspace opacification, cardiomediastinal silhouette derangement, pneumothorax, bony derangement.  Patient does have small bilateral, right greater than left costophrenic angle blunting concerning for small pleural effusion, similar in comparison to prior, and All images reviewed independently.  Agree with radiology report at this time.   DG Chest Portable 1 View Result Date: 10/11/2023 CLINICAL DATA:  Increased cough and sputum production, COPD exacerbation versus pneumonia. EXAM: PORTABLE CHEST 1 VIEW COMPARISON:  05/14/2023. FINDINGS: Redemonstration of small  right pleural effusion, similar to the prior study. There is minimal blunting of left lateral costophrenic angle and underlying trace left pleural effusion can not be excluded. Bilateral lung fields are otherwise clear. No dense consolidation or lung collapse. Stable cardio-mediastinal silhouette. No acute osseous abnormalities. The soft tissues are within normal limits. IMPRESSION: Redemonstration of small right pleural effusion, similar to the prior study. There is minimal blunting of left lateral costophrenic angle and underlying trace left pleural effusion can not be excluded. Bilateral lung fields are otherwise clear. No dense consolidation or lung collapse. Electronically Signed   By: Ree Molt M.D.   On: 10/11/2023 13:56    EKG/Medicine tests: Not indicated EKG Interpretation:                  Interventions: DuoNeb, saline nasal spray, Afrin  See the EMR for full details regarding lab and imaging results.  Patient presents for epistaxis which resolved en route with EMS prior to arrival, additionally complains of increased cough, sputum production, sputum purulence consistent with COPD exacerbation.  Patient vitally stable, no respiratory distress, saturating well on home oxygen , minimal wheezing bilaterally.  Patient hemodynamically stable.  Chest x-ray obtained, does not demonstrate pneumonia, pneumothorax, similar appearance of likely small pleural effusions, therefore feel that patient's respiratory symptoms are most consistent with COPD exacerbation.  Given patient does have wheezing on exam, therefore DuoNeb administered.  Thereafter patient did have mild tachycardia consistent with exposure to albuterol .  Screening labs obtained and this showed a mild leukocytosis, consistent with COPD exacerbation  With regard to epistaxis, educated patient and son at bedside that epistaxis is likely related to drying effects of supplemental oxygen  usage as well as frequent nose blowing.  Recommended  against nose blowing, and educated on use of nasal saline.  Nasal saline was provided in the ED.  Screening CBC obtained and does not demonstrate significant anemia.  While in the emergency department, patient unfortunately did have recurrent epistaxis which was managed with Afrin and direct pressure with resolution.    A different son thereafter presented to bedside and states that patient does indeed have a humidifier at home, however it is not currently in use with her oxygen  concentrator.  Discussed diagnosis of COPD exacerbation, will treat with course of prednisone  and Augmentin .  Discussed nose blowing precautions, use of humidifier along with oxygen  concentrator, as well as regular use of nasal saline during acute illness.  Discussed that she can use Afrin twice daily as needed for congestion and lieu of nose blowing, and that if epistaxis recurs at home, she can use direct pressure and Afrin, and if epistaxis persists, to represent to the emergency department for further evaluation.  Patient and son at bedside expressed understanding, are comfortable this plan, discharged in stable condition.  Presentation is most consistent with acute complicated illness and Current presentation is complicated by underlying chronic conditions  Discussion of management or test interpretations with external provider(s): Not indicated  Risk Drugs:OTC drugs and Prescription drug management  Disposition: DISCHARGE: I believe that the patient is safe for discharge home with outpatient follow-up. Patient was informed of all pertinent physical exam, laboratory, and imaging findings.  Patient's suspected etiology of their symptom presentation was discussed with the patient and all questions were answered. We discussed following up with PCP, regular pulmonologist. I provided thorough ED return precautions. The patient feels safe and comfortable with this plan.  MDM generated using voice dictation software and may  contain dictation errors.  Please contact me for any clarification or with any questions.  Clinical Impression:  1. Epistaxis   2. COPD with acute exacerbation [facility])      Discharge   Final Clinical Impression(s) / ED Diagnoses Final diagnoses:  Epistaxis  COPD with acute exacerbation (HCC)    Rx / DC Orders ED Discharge Orders          Ordered    amoxicillin -clavulanate (AUGMENTIN ) 875-125 MG tablet  Every 12 hours,   Status:  Discontinued        10/11/23 1608    predniSONE  (DELTASONE ) 10 MG tablet  Daily        10/11/23 1608    amoxicillin -clavulanate (AUGMENTIN ) 875-125 MG tablet  Every 12 hours        10/11/23 1609             Rogelia Jerilynn RAMAN, MD 10/12/23 1134

## 2023-10-11 NOTE — ED Triage Notes (Signed)
 Patient BIB EMS from home c/o nose bleed after blowing her nose this morning. Bleeding controlled upon arrival. Patient report new cough, denies chest pain and SOB. Patient report taking Blood thinners. Hx epistaxis, COPD on 4L [place] at home.

## 2023-10-12 DIAGNOSIS — R040 Epistaxis: Secondary | ICD-10-CM | POA: Diagnosis not present

## 2023-10-23 ENCOUNTER — Other Ambulatory Visit (HOSPITAL_BASED_OUTPATIENT_CLINIC_OR_DEPARTMENT_OTHER): Payer: Self-pay | Admitting: Pulmonary Disease

## 2023-10-23 DIAGNOSIS — I5032 Chronic diastolic (congestive) heart failure: Secondary | ICD-10-CM | POA: Diagnosis not present

## 2023-10-23 DIAGNOSIS — E46 Unspecified protein-calorie malnutrition: Secondary | ICD-10-CM | POA: Diagnosis not present

## 2023-10-23 DIAGNOSIS — I10 Essential (primary) hypertension: Secondary | ICD-10-CM | POA: Diagnosis not present

## 2023-10-23 DIAGNOSIS — J441 Chronic obstructive pulmonary disease with (acute) exacerbation: Secondary | ICD-10-CM | POA: Diagnosis not present

## 2023-11-04 ENCOUNTER — Emergency Department (HOSPITAL_COMMUNITY)

## 2023-11-04 ENCOUNTER — Observation Stay (HOSPITAL_COMMUNITY)
Admission: EM | Admit: 2023-11-04 | Discharge: 2023-11-08 | DRG: 871 | Disposition: A | Discharge status: Rehabilitation Facility | Attending: Internal Medicine | Admitting: Internal Medicine

## 2023-11-04 ENCOUNTER — Observation Stay (HOSPITAL_COMMUNITY)

## 2023-11-04 DIAGNOSIS — Z825 Family history of asthma and other chronic lower respiratory diseases: Secondary | ICD-10-CM | POA: Diagnosis not present

## 2023-11-04 DIAGNOSIS — R918 Other nonspecific abnormal finding of lung field: Secondary | ICD-10-CM | POA: Diagnosis not present

## 2023-11-04 DIAGNOSIS — Z91148 Patient's other noncompliance with medication regimen for other reason: Secondary | ICD-10-CM

## 2023-11-04 DIAGNOSIS — A419 Sepsis, unspecified organism: Principal | ICD-10-CM | POA: Diagnosis present

## 2023-11-04 DIAGNOSIS — Z91128 Patient's intentional underdosing of medication regimen for other reason: Secondary | ICD-10-CM

## 2023-11-04 DIAGNOSIS — G9341 Metabolic encephalopathy: Secondary | ICD-10-CM | POA: Diagnosis present

## 2023-11-04 DIAGNOSIS — R471 Dysarthria and anarthria: Secondary | ICD-10-CM | POA: Diagnosis present

## 2023-11-04 DIAGNOSIS — Z741 Need for assistance with personal care: Secondary | ICD-10-CM | POA: Diagnosis present

## 2023-11-04 DIAGNOSIS — G8311 Monoplegia of lower limb affecting right dominant side: Secondary | ICD-10-CM | POA: Diagnosis present

## 2023-11-04 DIAGNOSIS — I10 Essential (primary) hypertension: Secondary | ICD-10-CM | POA: Diagnosis present

## 2023-11-04 DIAGNOSIS — R4182 Altered mental status, unspecified: Secondary | ICD-10-CM | POA: Diagnosis not present

## 2023-11-04 DIAGNOSIS — I639 Cerebral infarction, unspecified: Secondary | ICD-10-CM | POA: Diagnosis not present

## 2023-11-04 DIAGNOSIS — G459 Transient cerebral ischemic attack, unspecified: Secondary | ICD-10-CM | POA: Diagnosis not present

## 2023-11-04 DIAGNOSIS — I69398 Other sequelae of cerebral infarction: Secondary | ICD-10-CM | POA: Diagnosis not present

## 2023-11-04 DIAGNOSIS — Z7901 Long term (current) use of anticoagulants: Secondary | ICD-10-CM

## 2023-11-04 DIAGNOSIS — M8580 Other specified disorders of bone density and structure, unspecified site: Secondary | ICD-10-CM | POA: Diagnosis present

## 2023-11-04 DIAGNOSIS — R1310 Dysphagia, unspecified: Secondary | ICD-10-CM | POA: Diagnosis present

## 2023-11-04 DIAGNOSIS — D638 Anemia in other chronic diseases classified elsewhere: Secondary | ICD-10-CM | POA: Diagnosis present

## 2023-11-04 DIAGNOSIS — D631 Anemia in chronic kidney disease: Secondary | ICD-10-CM | POA: Diagnosis not present

## 2023-11-04 DIAGNOSIS — I5032 Chronic diastolic (congestive) heart failure: Secondary | ICD-10-CM | POA: Diagnosis not present

## 2023-11-04 DIAGNOSIS — R29703 NIHSS score 3: Secondary | ICD-10-CM | POA: Diagnosis present

## 2023-11-04 DIAGNOSIS — Z66 Do not resuscitate: Secondary | ICD-10-CM | POA: Diagnosis present

## 2023-11-04 DIAGNOSIS — I4821 Permanent atrial fibrillation: Secondary | ICD-10-CM | POA: Diagnosis not present

## 2023-11-04 DIAGNOSIS — J449 Chronic obstructive pulmonary disease, unspecified: Secondary | ICD-10-CM | POA: Diagnosis present

## 2023-11-04 DIAGNOSIS — I700 Atherosclerosis of aorta: Secondary | ICD-10-CM | POA: Diagnosis not present

## 2023-11-04 DIAGNOSIS — Z7951 Long term (current) use of inhaled steroids: Secondary | ICD-10-CM | POA: Diagnosis not present

## 2023-11-04 DIAGNOSIS — Z87891 Personal history of nicotine dependence: Secondary | ICD-10-CM

## 2023-11-04 DIAGNOSIS — E860 Dehydration: Secondary | ICD-10-CM | POA: Diagnosis not present

## 2023-11-04 DIAGNOSIS — Z91199 Patient's noncompliance with other medical treatment and regimen due to unspecified reason: Secondary | ICD-10-CM | POA: Diagnosis not present

## 2023-11-04 DIAGNOSIS — I4819 Other persistent atrial fibrillation: Secondary | ICD-10-CM | POA: Diagnosis not present

## 2023-11-04 DIAGNOSIS — S81811A Laceration without foreign body, right lower leg, initial encounter: Secondary | ICD-10-CM | POA: Diagnosis not present

## 2023-11-04 DIAGNOSIS — K5901 Slow transit constipation: Secondary | ICD-10-CM | POA: Diagnosis not present

## 2023-11-04 DIAGNOSIS — Z79899 Other long term (current) drug therapy: Secondary | ICD-10-CM

## 2023-11-04 DIAGNOSIS — R29818 Other symptoms and signs involving the nervous system: Secondary | ICD-10-CM | POA: Diagnosis not present

## 2023-11-04 DIAGNOSIS — I5A Non-ischemic myocardial injury (non-traumatic): Secondary | ICD-10-CM | POA: Diagnosis not present

## 2023-11-04 DIAGNOSIS — I69320 Aphasia following cerebral infarction: Secondary | ICD-10-CM | POA: Diagnosis not present

## 2023-11-04 DIAGNOSIS — I130 Hypertensive heart and chronic kidney disease with heart failure and stage 1 through stage 4 chronic kidney disease, or unspecified chronic kidney disease: Secondary | ICD-10-CM | POA: Diagnosis not present

## 2023-11-04 DIAGNOSIS — I4891 Unspecified atrial fibrillation: Secondary | ICD-10-CM | POA: Diagnosis not present

## 2023-11-04 DIAGNOSIS — I480 Paroxysmal atrial fibrillation: Secondary | ICD-10-CM | POA: Diagnosis present

## 2023-11-04 DIAGNOSIS — E785 Hyperlipidemia, unspecified: Secondary | ICD-10-CM | POA: Diagnosis present

## 2023-11-04 DIAGNOSIS — N179 Acute kidney failure, unspecified: Secondary | ICD-10-CM | POA: Diagnosis present

## 2023-11-04 DIAGNOSIS — Z853 Personal history of malignant neoplasm of breast: Secondary | ICD-10-CM

## 2023-11-04 DIAGNOSIS — N3000 Acute cystitis without hematuria: Secondary | ICD-10-CM | POA: Diagnosis present

## 2023-11-04 DIAGNOSIS — I6381 Other cerebral infarction due to occlusion or stenosis of small artery: Secondary | ICD-10-CM | POA: Diagnosis not present

## 2023-11-04 DIAGNOSIS — R040 Epistaxis: Secondary | ICD-10-CM | POA: Diagnosis not present

## 2023-11-04 DIAGNOSIS — T45516A Underdosing of anticoagulants, initial encounter: Secondary | ICD-10-CM | POA: Diagnosis present

## 2023-11-04 DIAGNOSIS — I6349 Cerebral infarction due to embolism of other cerebral artery: Secondary | ICD-10-CM | POA: Diagnosis not present

## 2023-11-04 DIAGNOSIS — R531 Weakness: Secondary | ICD-10-CM | POA: Diagnosis not present

## 2023-11-04 DIAGNOSIS — I517 Cardiomegaly: Secondary | ICD-10-CM | POA: Diagnosis not present

## 2023-11-04 DIAGNOSIS — I63432 Cerebral infarction due to embolism of left posterior cerebral artery: Secondary | ICD-10-CM | POA: Diagnosis present

## 2023-11-04 DIAGNOSIS — J9611 Chronic respiratory failure with hypoxia: Secondary | ICD-10-CM | POA: Diagnosis not present

## 2023-11-04 DIAGNOSIS — I6389 Other cerebral infarction: Secondary | ICD-10-CM | POA: Diagnosis not present

## 2023-11-04 HISTORY — DX: Metabolic encephalopathy: G93.41

## 2023-11-04 LAB — CBC WITH DIFFERENTIAL/PLATELET
Abs Immature Granulocytes: 0.03 K/uL (ref 0.00–0.07)
Basophils Absolute: 0 K/uL (ref 0.0–0.1)
Basophils Relative: 0 %
Eosinophils Absolute: 0.1 K/uL (ref 0.0–0.5)
Eosinophils Relative: 1 %
HCT: 30.9 % — ABNORMAL LOW (ref 36.0–46.0)
Hemoglobin: 9.3 g/dL — ABNORMAL LOW (ref 12.0–15.0)
Immature Granulocytes: 0 %
Lymphocytes Relative: 6 %
Lymphs Abs: 0.5 K/uL — ABNORMAL LOW (ref 0.7–4.0)
MCH: 28.8 pg (ref 26.0–34.0)
MCHC: 30.1 g/dL (ref 30.0–36.0)
MCV: 95.7 fL (ref 80.0–100.0)
Monocytes Absolute: 0.4 K/uL (ref 0.1–1.0)
Monocytes Relative: 5 %
Neutro Abs: 7 K/uL (ref 1.7–7.7)
Neutrophils Relative %: 88 %
Platelets: 266 K/uL (ref 150–400)
RBC: 3.23 MIL/uL — ABNORMAL LOW (ref 3.87–5.11)
RDW: 13.5 % (ref 11.5–15.5)
WBC: 8.1 K/uL (ref 4.0–10.5)
nRBC: 0 % (ref 0.0–0.2)

## 2023-11-04 LAB — COMPREHENSIVE METABOLIC PANEL WITH GFR
ALT: 8 U/L (ref 0–44)
ALT: 6 U/L (ref 0–44)
AST: 16 U/L (ref 15–41)
AST: 19 U/L (ref 15–41)
Albumin: 3.1 g/dL — ABNORMAL LOW (ref 3.5–5.0)
Albumin: 2.9 g/dL — ABNORMAL LOW (ref 3.5–5.0)
Alkaline Phosphatase: 61 U/L (ref 38–126)
Alkaline Phosphatase: 58 U/L (ref 38–126)
Anion gap: 12 (ref 5–15)
Anion gap: 12 (ref 5–15)
BUN: 22 mg/dL (ref 8–23)
BUN: 21 mg/dL (ref 8–23)
CO2: 26 mmol/L (ref 22–32)
CO2: 24 mmol/L (ref 22–32)
Calcium: 9.1 mg/dL (ref 8.9–10.3)
Calcium: 8.6 mg/dL — ABNORMAL LOW (ref 8.9–10.3)
Chloride: 98 mmol/L (ref 98–111)
Chloride: 103 mmol/L (ref 98–111)
Creatinine, Ser: 1.83 mg/dL — ABNORMAL HIGH (ref 0.44–1.00)
Creatinine, Ser: 1.66 mg/dL — ABNORMAL HIGH (ref 0.44–1.00)
GFR, Estimated: 26 mL/min — ABNORMAL LOW (ref >60)
GFR, Estimated: 29 mL/min — ABNORMAL LOW (ref >60)
Glucose, Bld: 117 mg/dL — ABNORMAL HIGH (ref 70–99)
Glucose, Bld: 134 mg/dL — ABNORMAL HIGH (ref 70–99)
Potassium: 3.6 mmol/L (ref 3.5–5.1)
Potassium: 4 mmol/L (ref 3.5–5.1)
Sodium: 136 mmol/L (ref 135–145)
Sodium: 139 mmol/L (ref 135–145)
Total Bilirubin: 0.3 mg/dL (ref 0.0–1.2)
Total Bilirubin: 0.8 mg/dL (ref 0.0–1.2)
Total Protein: 6.8 g/dL (ref 6.5–8.1)
Total Protein: 6.6 g/dL (ref 6.5–8.1)

## 2023-11-04 LAB — RAPID URINE DRUG SCREEN, HOSP PERFORMED
Amphetamines: NOT DETECTED
Barbiturates: NOT DETECTED
Benzodiazepines: NOT DETECTED
Cocaine: NOT DETECTED
Opiates: NOT DETECTED
Tetrahydrocannabinol: NOT DETECTED

## 2023-11-04 LAB — DIFFERENTIAL
Abs Immature Granulocytes: 0.03 K/uL (ref 0.00–0.07)
Basophils Absolute: 0 K/uL (ref 0.0–0.1)
Basophils Relative: 0 %
Eosinophils Absolute: 0.2 K/uL (ref 0.0–0.5)
Eosinophils Relative: 2 %
Immature Granulocytes: 0 %
Lymphocytes Relative: 16 %
Lymphs Abs: 1.5 K/uL (ref 0.7–4.0)
Monocytes Absolute: 0.7 K/uL (ref 0.1–1.0)
Monocytes Relative: 7 %
Neutro Abs: 7.4 K/uL (ref 1.7–7.7)
Neutrophils Relative %: 75 %

## 2023-11-04 LAB — APTT: aPTT: 25 s (ref 24–36)

## 2023-11-04 LAB — PROTIME-INR
INR: 1 (ref 0.8–1.2)
Prothrombin Time: 14.2 s (ref 11.4–15.2)

## 2023-11-04 LAB — URINALYSIS, ROUTINE W REFLEX MICROSCOPIC
Bilirubin Urine: NEGATIVE
Glucose, UA: NEGATIVE mg/dL
Ketones, ur: NEGATIVE mg/dL
Nitrite: NEGATIVE
Protein, ur: NEGATIVE mg/dL
Specific Gravity, Urine: 1.006 (ref 1.005–1.030)
pH: 5 (ref 5.0–8.0)

## 2023-11-04 LAB — CBC
HCT: 31.9 % — ABNORMAL LOW (ref 36.0–46.0)
Hemoglobin: 9.7 g/dL — ABNORMAL LOW (ref 12.0–15.0)
MCH: 29.3 pg (ref 26.0–34.0)
MCHC: 30.4 g/dL (ref 30.0–36.0)
MCV: 96.4 fL (ref 80.0–100.0)
Platelets: 295 K/uL (ref 150–400)
RBC: 3.31 MIL/uL — ABNORMAL LOW (ref 3.87–5.11)
RDW: 13.7 % (ref 11.5–15.5)
WBC: 9.9 K/uL (ref 4.0–10.5)
nRBC: 0 % (ref 0.0–0.2)

## 2023-11-04 LAB — ETHANOL: Alcohol, Ethyl (B): <15 mg/dL (ref <15)

## 2023-11-04 LAB — I-STAT CHEM 8, ED
BUN: 24 mg/dL — ABNORMAL HIGH (ref 8–23)
Calcium, Ion: 1.1 mmol/L — ABNORMAL LOW (ref 1.15–1.40)
Chloride: 98 mmol/L (ref 98–111)
Creatinine, Ser: 1.9 mg/dL — ABNORMAL HIGH (ref 0.44–1.00)
Glucose, Bld: 119 mg/dL — ABNORMAL HIGH (ref 70–99)
HCT: 30 % — ABNORMAL LOW (ref 36.0–46.0)
Hemoglobin: 10.2 g/dL — ABNORMAL LOW (ref 12.0–15.0)
Potassium: 3.7 mmol/L (ref 3.5–5.1)
Sodium: 137 mmol/L (ref 135–145)
TCO2: 28 mmol/L (ref 22–32)

## 2023-11-04 LAB — PROCALCITONIN: Procalcitonin: <0.1 ng/mL

## 2023-11-04 LAB — MAGNESIUM: Magnesium: 1.8 mg/dL (ref 1.7–2.4)

## 2023-11-04 LAB — CBG MONITORING, ED: Glucose-Capillary: 111 mg/dL — ABNORMAL HIGH (ref 70–99)

## 2023-11-04 MED ORDER — METOPROLOL TARTRATE 5 MG/5ML IV SOLN
5.0000 mg | INTRAVENOUS | Status: DC | PRN
  Filled 2023-11-04: qty 5

## 2023-11-04 MED ORDER — ROSUVASTATIN CALCIUM 20 MG PO TABS
40.0000 mg | ORAL_TABLET | Freq: Every day | ORAL | Status: DC
Start: 2023-11-04 — End: 2023-11-05
  Administered 2023-11-05: 40 mg via ORAL
  Filled 2023-11-04: qty 2

## 2023-11-04 MED ORDER — APIXABAN 2.5 MG PO TABS
2.5000 mg | ORAL_TABLET | Freq: Two times a day (BID) | ORAL | Status: DC
  Administered 2023-11-04 – 2023-11-08 (×9): 2.5 mg via ORAL
  Filled 2023-11-04 (×9): qty 1

## 2023-11-04 MED ORDER — ONDANSETRON HCL 4 MG/2ML IJ SOLN
4.0000 mg | Freq: Four times a day (QID) | INTRAMUSCULAR | Status: DC | PRN
  Administered 2023-11-07: 4 mg via INTRAVENOUS
  Filled 2023-11-04: qty 2

## 2023-11-04 MED ORDER — ACETAMINOPHEN 650 MG RE SUPP: 650.0000 mg | Freq: Four times a day (QID) | RECTAL | Status: DC | PRN

## 2023-11-04 MED ORDER — SODIUM CHLORIDE 0.9 % IV BOLUS
500.0000 mL | Freq: Once | INTRAVENOUS | Status: AC
  Administered 2023-11-04: 500 mL via INTRAVENOUS

## 2023-11-04 MED ORDER — BUDESON-GLYCOPYRROL-FORMOTEROL 160-9-4.8 MCG/ACT IN AERO
2.0000 | INHALATION_SPRAY | Freq: Two times a day (BID) | RESPIRATORY_TRACT | Status: DC
  Administered 2023-11-04 – 2023-11-08 (×8): 2 via RESPIRATORY_TRACT
  Filled 2023-11-04: qty 5.9

## 2023-11-04 MED ORDER — HYDRALAZINE HCL 25 MG PO TABS
25.0000 mg | ORAL_TABLET | Freq: Two times a day (BID) | ORAL | Status: DC
  Administered 2023-11-04 – 2023-11-08 (×8): 25 mg via ORAL
  Filled 2023-11-04 (×9): qty 1

## 2023-11-04 MED ORDER — METOPROLOL TARTRATE 25 MG PO TABS
12.5000 mg | ORAL_TABLET | Freq: Once | ORAL | Status: AC
  Administered 2023-11-04: 12.5 mg via ORAL
  Filled 2023-11-04: qty 1

## 2023-11-04 MED ORDER — MELATONIN 3 MG PO TABS: 3.0000 mg | ORAL_TABLET | Freq: Every evening | ORAL | Status: DC | PRN

## 2023-11-04 MED ORDER — SODIUM CHLORIDE 0.9 % IV SOLN
1.0000 g | INTRAVENOUS | Status: DC
  Administered 2023-11-04 – 2023-11-06 (×3): 1 g via INTRAVENOUS
  Filled 2023-11-04 (×3): qty 10

## 2023-11-04 MED ORDER — METOPROLOL TARTRATE 5 MG/5ML IV SOLN
5.0000 mg | INTRAVENOUS | Status: DC | PRN
Start: 2023-11-04 — End: 2023-11-04
  Administered 2023-11-04: 5 mg via INTRAVENOUS
  Filled 2023-11-04: qty 5

## 2023-11-04 MED ORDER — SODIUM CHLORIDE 0.9 % IV SOLN: INTRAVENOUS | Status: AC

## 2023-11-04 MED ORDER — SODIUM CHLORIDE 0.9 % IV SOLN: 500.0000 mg | INTRAVENOUS | Status: DC

## 2023-11-04 MED ORDER — CEFTRIAXONE SODIUM 1 G IJ SOLR
1.0000 g | Freq: Once | INTRAMUSCULAR | Status: AC
  Administered 2023-11-04: 1 g via INTRAVENOUS
  Filled 2023-11-04: qty 10

## 2023-11-04 MED ORDER — SODIUM CHLORIDE 0.9 % IV SOLN
500.0000 mg | Freq: Once | INTRAVENOUS | Status: AC
  Administered 2023-11-04: 500 mg via INTRAVENOUS
  Filled 2023-11-04: qty 5

## 2023-11-04 MED ORDER — IPRATROPIUM-ALBUTEROL 0.5-2.5 (3) MG/3ML IN SOLN
3.0000 mL | Freq: Four times a day (QID) | RESPIRATORY_TRACT | Status: DC | PRN
  Administered 2023-11-07: 3 mL via RESPIRATORY_TRACT
  Filled 2023-11-04: qty 3

## 2023-11-04 MED ORDER — ACETAMINOPHEN 325 MG PO TABS
650.0000 mg | ORAL_TABLET | Freq: Four times a day (QID) | ORAL | Status: DC | PRN
  Administered 2023-11-06 – 2023-11-08 (×4): 650 mg via ORAL
  Filled 2023-11-04 (×4): qty 2

## 2023-11-04 NOTE — Code Documentation (Signed)
 Responded to Code Stroke called at 0129 for R sided weakness and headache, [id number]. Pt arrived at 0134, CBG-111, NIH-3(decreased to 2 in CT), CT head negative for acute changes. TNK not given-not focal exam. Plan metabolic workup. Please complete VS/Neuro checks(including NIH) q2h x 12h, then q4h.

## 2023-11-04 NOTE — ED Provider Notes (Signed)
 [place] EMERGENCY DEPARTMENT AT [facility] Provider Note   CSN: [id number] Arrival date & time: 11/04/23  0134  An emergency department physician performed an initial assessment on this suspected stroke patient at 0134.  Patient presents with: No chief complaint on file.   Courtney Cline is a 89 y.o. female.   The history is provided by the EMS personnel and the patient. The history is limited by the condition of the patient.  Cerebrovascular Accident This is a new problem. The current episode started 3 to 5 hours ago. The problem occurs constantly. The problem has not changed since onset.Pertinent negatives include no chest pain, no abdominal pain and no shortness of breath. Nothing aggravates the symptoms. Nothing relieves the symptoms. She has tried nothing for the symptoms. The treatment provided no relief.       Prior to Admission medications   Medication Sig Start Date End Date Taking? Authorizing Provider  albuterol  (PROVENTIL ) (2.5 MG/3ML) 0.083% nebulizer solution Take 3 mLs (2.5 mg total) by nebulization 3 (three) times daily for 3 days, THEN 3 mLs (2.5 mg total) every 6 (six) hours as needed for wheezing or shortness of breath. Patient not taking: Reported on 11/04/2023 02/16/23 02/19/24  Sebastian Toribio GAILS, MD  albuterol  (VENTOLIN  HFA) 108 [phone] Base) MCG/ACT inhaler INHALE TWO PUFFS into THE lungs EVERY 6 HOURS AS NEEDED FOR wheezing OR SHORTNESS OF BREATH 10/24/23   Kassie Acquanetta Bradley, MD  apixaban  (ELIQUIS ) 2.5 MG TABS tablet TAKE ONE TABLET TWICE DAILY Patient not taking: Reported on 11/04/2023 03/20/23   Court Dorn PARAS, MD  Cholecalciferol  (VITAMIN D ) 2000 UNITS CAPS Take 2,000 Units by mouth daily.     [provider]  Cyanocobalamin  (VITAMIN B 12 PO) Take 1 tablet by mouth daily.    [provider]  Fluticasone -Umeclidin-Vilant (TRELEGY ELLIPTA ) 200-62.5-25 MCG/ACT AEPB Inhale 1 puff into the lungs daily. 04/27/23   Kassie Acquanetta Bradley, MD   furosemide  (LASIX ) 40 MG tablet Take 0.5 tablets (20 mg total) by mouth 2 (two) times daily. <PLEASE MAKE APPOINTMENT FOR REFILLS> 02/16/23   Sebastian Toribio GAILS, MD  rosuvastatin  (CRESTOR ) 40 MG tablet Take 40 mg by mouth daily. 02/02/21   [provider]    Allergies: Patient has no known allergies.    Review of Systems  Unable to perform ROS: Acuity of condition  Constitutional:  Negative for fever.  Respiratory:  Negative for shortness of breath.   Cardiovascular:  Negative for chest pain.  Gastrointestinal:  Negative for abdominal pain.  Neurological:  Positive for speech difficulty and numbness.    Updated Vital Signs BP 139/63   Pulse 79   Temp 97.8 F (36.6 C) (Oral)   Resp 15   Ht 5' 2 (1.575 m)   Wt 71.8 kg   SpO2 100%   BMI 28.97 kg/m   Physical Exam Vitals and nursing note reviewed.  Constitutional:      General: She is not in acute distress.    Appearance: She is well-developed.  HENT:     Head: Normocephalic and atraumatic.     Nose: Nose normal.  Eyes:     Pupils: Pupils are equal, round, and reactive to light.  Cardiovascular:     Rate and Rhythm: Normal rate and regular rhythm.     Pulses: Normal pulses.     Heart sounds: Normal heart sounds.  Pulmonary:     Effort: Pulmonary effort is normal. No respiratory distress.     Breath sounds: Rhonchi  and rales present.  Abdominal:     General: Bowel sounds are normal. There is no distension.     Palpations: Abdomen is soft.     Tenderness: There is no abdominal tenderness. There is no guarding or rebound.  Musculoskeletal:        General: Normal range of motion.     Cervical back: Neck supple.  Skin:    General: Skin is warm and dry.     Capillary Refill: Capillary refill takes less than 2 seconds.     Findings: No erythema or rash.  Neurological:     General: No focal deficit present.     Mental Status: She is alert.     Cranial Nerves: No cranial nerve deficit.     Sensory: No sensory  deficit.     Deep Tendon Reflexes: Reflexes normal.  Psychiatric:        Mood and Affect: Mood normal.     (all labs ordered are listed, but only abnormal results are displayed) Results for orders placed or performed during the hospital encounter of 11/04/23  CBG monitoring, ED   Collection Time: 11/04/23  1:39 AM  Result Value Ref Range   Glucose-Capillary 111 (H) 70 - 99 mg/dL  I-stat chem 8, ED   Collection Time: 11/04/23  1:44 AM  Result Value Ref Range   Sodium 137 135 - 145 mmol/L   Potassium 3.7 3.5 - 5.1 mmol/L   Chloride 98 98 - 111 mmol/L   BUN 24 (H) 8 - 23 mg/dL   Creatinine, Ser 8.09 (H) 0.44 - 1.00 mg/dL   Glucose, Bld 880 (H) 70 - 99 mg/dL   Calcium , Ion 1.10 (L) 1.15 - 1.40 mmol/L   TCO2 28 22 - 32 mmol/L   Hemoglobin 10.2 (L) 12.0 - 15.0 g/dL   HCT 69.9 (L) 63.9 - 53.9 %  Ethanol   Collection Time: 11/04/23  1:47 AM  Result Value Ref Range   Alcohol , Ethyl (B) <15 <15 mg/dL  Protime-INR   Collection Time: 11/04/23  1:47 AM  Result Value Ref Range   Prothrombin Time 14.2 11.4 - 15.2 seconds   INR 1.0 0.8 - 1.2  APTT   Collection Time: 11/04/23  1:47 AM  Result Value Ref Range   aPTT 25 24 - 36 seconds  CBC   Collection Time: 11/04/23  1:47 AM  Result Value Ref Range   WBC 9.9 4.0 - 10.5 K/uL   RBC 3.31 (L) 3.87 - 5.11 MIL/uL   Hemoglobin 9.7 (L) 12.0 - 15.0 g/dL   HCT 68.0 (L) 63.9 - 53.9 %   MCV 96.4 80.0 - 100.0 fL   MCH 29.3 26.0 - 34.0 pg   MCHC 30.4 30.0 - 36.0 g/dL   RDW 86.2 88.4 - 84.4 %   Platelets 295 150 - 400 K/uL   nRBC 0.0 0.0 - 0.2 %  Differential   Collection Time: 11/04/23  1:47 AM  Result Value Ref Range   Neutrophils Relative % 75 %   Neutro Abs 7.4 1.7 - 7.7 K/uL   Lymphocytes Relative 16 %   Lymphs Abs 1.5 0.7 - 4.0 K/uL   Monocytes Relative 7 %   Monocytes Absolute 0.7 0.1 - 1.0 K/uL   Eosinophils Relative 2 %   Eosinophils Absolute 0.2 0.0 - 0.5 K/uL   Basophils Relative 0 %   Basophils Absolute 0.0 0.0 - 0.1 K/uL    Immature Granulocytes 0 %   Abs Immature Granulocytes 0.03 0.00 -  0.07 K/uL  Comprehensive metabolic panel   Collection Time: 11/04/23  1:47 AM  Result Value Ref Range   Sodium 136 135 - 145 mmol/L   Potassium 3.6 3.5 - 5.1 mmol/L   Chloride 98 98 - 111 mmol/L   CO2 26 22 - 32 mmol/L   Glucose, Bld 117 (H) 70 - 99 mg/dL   BUN 22 8 - 23 mg/dL   Creatinine, Ser 8.16 (H) 0.44 - 1.00 mg/dL   Calcium  9.1 8.9 - 10.3 mg/dL   Total Protein 6.8 6.5 - 8.1 g/dL   Albumin 3.1 (L) 3.5 - 5.0 g/dL   AST 16 15 - 41 U/L   ALT 8 0 - 44 U/L   Alkaline Phosphatase 61 38 - 126 U/L   Total Bilirubin 0.3 0.0 - 1.2 mg/dL   GFR, Estimated 26 (L) >60 mL/min   Anion gap 12 5 - 15  Urine rapid drug screen (hosp performed)   Collection Time: 11/04/23  2:10 AM  Result Value Ref Range   Opiates NONE DETECTED NONE DETECTED   Cocaine NONE DETECTED NONE DETECTED   Benzodiazepines NONE DETECTED NONE DETECTED   Amphetamines NONE DETECTED NONE DETECTED   Tetrahydrocannabinol NONE DETECTED NONE DETECTED   Barbiturates NONE DETECTED NONE DETECTED  Urinalysis, Routine w reflex microscopic -Urine, Catheterized   Collection Time: 11/04/23  2:10 AM  Result Value Ref Range   Color, Urine YELLOW YELLOW   APPearance HAZY (A) CLEAR   Specific Gravity, Urine 1.006 1.005 - 1.030   pH 5.0 5.0 - 8.0   Glucose, UA NEGATIVE NEGATIVE mg/dL   Hgb urine dipstick SMALL (A) NEGATIVE   Bilirubin Urine NEGATIVE NEGATIVE   Ketones, ur NEGATIVE NEGATIVE mg/dL   Protein, ur NEGATIVE NEGATIVE mg/dL   Nitrite NEGATIVE NEGATIVE   Leukocytes,Ua LARGE (A) NEGATIVE   RBC / HPF 0-5 0 - 5 RBC/hpf   WBC, UA 11-20 0 - 5 WBC/hpf   Bacteria, UA RARE (A) NONE SEEN   Squamous Epithelial / HPF 6-10 0 - 5 /HPF   Mucus PRESENT    Hyaline Casts, UA PRESENT    DG Chest Portable 1 View Result Date: 11/04/2023 EXAM: 1 VIEW XRAY OF THE CHEST 11/04/2023 02:43:53 AM COMPARISON: 10/11/2023 CLINICAL HISTORY: painetc. FINDINGS: LUNGS AND PLEURA: Mild  bibasilar opacities, likely atelectasis. No pleural effusion. No pneumothorax. HEART AND MEDIASTINUM: Stable mild cardiomegaly. Thoracic aortic atherosclerosis. BONES AND SOFT TISSUES: No acute osseous abnormality. IMPRESSION: 1. Mild bibasilar opacities, likely atelectasis. 2. No acute abnormalities. Electronically signed by: Pinkie Pebbles MD 11/04/2023 02:47 AM EDT RP Workstation: [id number]   CT HEAD CODE STROKE WO CONTRAST Result Date: 11/04/2023 EXAM: CT HEAD WITHOUT CONTRAST 11/04/2023 01:51:22 AM TECHNIQUE: CT of the head was performed without the administration of intravenous contrast. Automated exposure control, iterative reconstruction, and/or weight based adjustment of the mA/kV was utilized to reduce the radiation dose to as low as reasonably achievable. COMPARISON: None available. CLINICAL HISTORY: Neuro deficit, acute, stroke suspected. Stroke; Dr. Merrianne FINDINGS: BRAIN AND VENTRICLES: Chronic ischemic white matter changes. Mild volume loss. ASPECTS is 10. Old cerebellar small vessel infarcts. No acute hemorrhage. No evidence of acute infarct. No hydrocephalus. No extra-axial collection. No mass effect or midline shift. ORBITS: No acute abnormality. SINUSES: No acute abnormality. SOFT TISSUES AND SKULL: No acute soft tissue abnormality. No skull fracture. IMPRESSION: 1. No acute intracranial abnormality. ASPECTS is 10. 2. Chronic ischemic white matter changes, mild volume loss, and old cerebellar small vessel infarcts. 3. Findings communicated to  Dr. Lindzen at 2:01 am on 11/04/2023. Electronically signed by: Franky Stanford MD 11/04/2023 02:02 AM EDT RP Workstation: [id number]   DG Chest Portable 1 View Result Date: 10/11/2023 CLINICAL DATA:  Increased cough and sputum production, COPD exacerbation versus pneumonia. EXAM: PORTABLE CHEST 1 VIEW COMPARISON:  05/14/2023. FINDINGS: Redemonstration of small right pleural effusion, similar to the prior study. There is minimal blunting of left lateral  costophrenic angle and underlying trace left pleural effusion can not be excluded. Bilateral lung fields are otherwise clear. No dense consolidation or lung collapse. Stable cardio-mediastinal silhouette. No acute osseous abnormalities. The soft tissues are within normal limits. IMPRESSION: Redemonstration of small right pleural effusion, similar to the prior study. There is minimal blunting of left lateral costophrenic angle and underlying trace left pleural effusion can not be excluded. Bilateral lung fields are otherwise clear. No dense consolidation or lung collapse. Electronically Signed   By: Ree Molt M.D.   On: 10/11/2023 13:56     EKG: EKG Interpretation Date/Time:  Saturday November 04 2023 [id number] EDT Ventricular Rate:  71 PR Interval:  58 QRS Duration:  109 QT Interval:  411 QTC Calculation: 447 R Axis:   10  Text Interpretation: Sinus rhythm Short PR interval Confirmed by Laquitta Dominski ([id number]) on 11/04/2023 2:30:54 AM  Radiology: DG Chest Portable 1 View Result Date: 11/04/2023 EXAM: 1 VIEW XRAY OF THE CHEST 11/04/2023 02:43:53 AM COMPARISON: 10/11/2023 CLINICAL HISTORY: painetc. FINDINGS: LUNGS AND PLEURA: Mild bibasilar opacities, likely atelectasis. No pleural effusion. No pneumothorax. HEART AND MEDIASTINUM: Stable mild cardiomegaly. Thoracic aortic atherosclerosis. BONES AND SOFT TISSUES: No acute osseous abnormality. IMPRESSION: 1. Mild bibasilar opacities, likely atelectasis. 2. No acute abnormalities. Electronically signed by: Pinkie Pebbles MD 11/04/2023 02:47 AM EDT RP Workstation: [id number]   CT HEAD CODE STROKE WO CONTRAST Result Date: 11/04/2023 EXAM: CT HEAD WITHOUT CONTRAST 11/04/2023 01:51:22 AM TECHNIQUE: CT of the head was performed without the administration of intravenous contrast. Automated exposure control, iterative reconstruction, and/or weight based adjustment of the mA/kV was utilized to reduce the radiation dose to as low as reasonably achievable.  COMPARISON: None available. CLINICAL HISTORY: Neuro deficit, acute, stroke suspected. Stroke; Dr. Merrianne FINDINGS: BRAIN AND VENTRICLES: Chronic ischemic white matter changes. Mild volume loss. ASPECTS is 10. Old cerebellar small vessel infarcts. No acute hemorrhage. No evidence of acute infarct. No hydrocephalus. No extra-axial collection. No mass effect or midline shift. ORBITS: No acute abnormality. SINUSES: No acute abnormality. SOFT TISSUES AND SKULL: No acute soft tissue abnormality. No skull fracture. IMPRESSION: 1. No acute intracranial abnormality. ASPECTS is 10. 2. Chronic ischemic white matter changes, mild volume loss, and old cerebellar small vessel infarcts. 3. Findings communicated to Dr. Lindzen at 2:01 am on 11/04/2023. Electronically signed by: Franky Stanford MD 11/04/2023 02:02 AM EDT RP Workstation: [id number]     .Critical Care  Performed by: Nettie Earing, MD Authorized by: Nettie Earing, MD   Critical care provider statement:    Critical care time (minutes):  30   Critical care end time:  11/04/2023 3:17 AM   Critical care was necessary to treat or prevent imminent or life-threatening deterioration of the following conditions:  CNS failure or compromise   Critical care was time spent personally by me on the following activities:  Development of treatment plan with patient or surrogate, discussions with consultants, evaluation of patient's response to treatment, examination of patient, ordering and review of laboratory studies, ordering and review of radiographic studies, ordering and performing treatments and interventions, pulse oximetry, re-evaluation  of patient's condition and review of old charts   I assumed direction of critical care for this patient from another provider in my specialty: no     Care discussed with: admitting provider      Medications Ordered in the ED  cefTRIAXone  (ROCEPHIN ) 1 g in sodium chloride  0.9 % 100 mL IVPB (1 g Intravenous New Bag/Given 11/04/23  0300)  azithromycin  (ZITHROMAX ) 500 mg in sodium chloride  0.9 % 250 mL IVPB (has no administration in time range)  sodium chloride  0.9 % bolus 500 mL (500 mLs Intravenous New Bag/Given 11/04/23 0300)                                    Medical Decision Making Patient with speech difficulty and numbness of the lower extremity   Amount and/or Complexity of Data Reviewed Independent Historian: EMS    Details: See above  External Data Reviewed: notes.    Details: Previous notes reviewed  Labs: ordered.    Details: Urine is consistent with UTI normal sodium 136, normal potassium 3.6, elevated creatinine 1.9, low hemoglobin 9.7, normal platelets.  UDS is negative negative ETHO, normal coagulation studies  Radiology: ordered and independent interpretation performed.    Details: No ICH by me  ECG/medicine tests: ordered and independent interpretation performed. Decision-making details documented in ED Course.  Risk Decision regarding hospitalization.     Final diagnoses:  Altered mental status, unspecified altered mental status type  Acute cystitis without hematuria  AKI (acute kidney injury) (HCC)   The patient appears reasonably stabilized for admission considering the current resources, flow, and capabilities available in the ED at this time, and I doubt any other [facility] requiring further screening and/or treatment in the ED prior to admission.  ED Discharge Orders     None          Grace Haggart, MD 11/04/23 [id number]

## 2023-11-04 NOTE — Consult Note (Signed)
 NEUROLOGY CONSULT NOTE   Date of service: November 04, 2023 Patient Name: Courtney Cline MRN:  [id number] DOB:  [birth year] Chief Complaint: Headache, slurred speech and right lower extremity weakness Requesting Provider: Nettie Earing, MD  History of Present Illness  Courtney Cline is a 89 y.o. female with an extensive PMHx as documented below (reviewed) including atrial fibrillation, who presents from home via EMS as a Code Stroke after she was noted to have slurred speech after she slid off her couch she was sleeping in at about 12:50 AM. On EMS arrival she had slurred speech, RLE weakness and was complaining of a left sided headache. She was brought to CT emergently after arrival to the ED. In CT she denies any weakness. She knows that she slid off her couch, but is unable to recall any other details. EMS noted a strong smell of urine suggestive of a UTI. BP per EMS was 110/70.  She has no prior history of stroke. She has recently not been compliant with her Eliquis  due to nosebleeds. Last 30-day prescription for Eliquis  was filled in June.   LKW: 2200 Modified rankin score: 0 IV Thrombolysis: No: Symptoms too mild to treat EVT:  No: Presentation not consistent with LVO    NIHSS components Score: Comment  1a Level of Conscious 0[x]  1[]  2[]  3[]      1b LOC Questions 0[]  1[]  2[x]      Also not oriented to the day or year. Does know who the president is. Knows the city and the state.   1c LOC Commands 0[x]  1[]  2[]       2 Best Gaze 0[x]  1[]  2[]       3 Visual 0[x]  1[]  2[]  3[]      4 Facial Palsy 0[x]  1[]  2[]  3[]      5a Motor Arm - left 0[x]  1[]  2[]  3[]  4[]  UN[]    5b Motor Arm - Right 0[x]  1[]  2[]  3[]  4[]  UN[]    6a Motor Leg - Left 0[x]  1[]  2[]  3[]  4[]  UN[]    6b Motor Leg - Right 0[x]  1[]  2[]  3[]  4[]  UN[]    7 Limb Ataxia 0[x]  1[]  2[]  UN[]      8 Sensory 0[x]  1[]  2[]  UN[]      9 Best Language 0[x]  1[]  2[]  3[]      10 Dysarthria 0[x]  1[]  2[]  UN[]      11 Extinct. and Inattention  0[x]  1[]  2[]       TOTAL:   2      ROS  As per HPI. Detailed ROS deferred due to acuity of presentation.   Past History   Past Medical History:  Diagnosis Date   Allergic rhinitis    Arthritis    Breast calcification seen on mammogram    Right breast   Breast cancer (HCC)    COPD (chronic obstructive pulmonary disease) (HCC)    GERD (gastroesophageal reflux disease)    Hyperglycemia    Hyperlipidemia    Hypertension    Low back pain    On home oxygen  therapy    all the time   Osteopenia    Peripheral edema    Shortness of breath    Subclavian artery stenosis, left (HCC)    Vitamin D  deficiency    Wears dentures    top   Wears glasses    reading    Past Surgical History:  Procedure Laterality Date   BREAST BIOPSY     BREAST LUMPECTOMY     right 2015   BREAST LUMPECTOMY WITH RADIOACTIVE SEED  LOCALIZATION Right 12/20/2013   Procedure: RIGHT BREAST LUMPECTOMY WITH RADIOACTIVE SEED LOCALIZATION;  Surgeon: Debby Shipper, MD;  Location: [facility];  Service: General;  Laterality: Right;   CAROTID DUPLEX  2014   CATARACT EXTRACTION     both eyes    Family History: Family History  Problem Relation Age of Onset   Emphysema Mother    Emphysema Maternal Grandfather     Social History  reports that she quit smoking about 29 years ago. Her smoking use included cigarettes. She started smoking about 69 years ago. She has a 40 pack-year smoking history. She has never used smokeless tobacco. She reports that she does not drink alcohol  and does not use drugs.  No Known Allergies  Medications  No current facility-administered medications for this encounter.  Current Outpatient Medications:    acetaminophen  (TYLENOL ) 325 MG tablet, Take 650 mg by mouth every 6 (six) hours as needed for mild pain or headache. , Disp: , Rfl:    albuterol  (PROVENTIL ) (2.5 MG/3ML) 0.083% nebulizer solution, Take 3 mLs (2.5 mg total) by nebulization 3 (three) times daily for 3  days, THEN 3 mLs (2.5 mg total) every 6 (six) hours as needed for wheezing or shortness of breath., Disp: 90 mL, Rfl: 0   albuterol  (VENTOLIN  HFA) 108 (90 Base) MCG/ACT inhaler, INHALE TWO PUFFS into THE lungs EVERY 6 HOURS AS NEEDED FOR wheezing OR SHORTNESS OF BREATH, Disp: 8.5 g, Rfl: 6   amoxicillin -clavulanate (AUGMENTIN ) 875-125 MG tablet, Take 1 tablet by mouth every 12 (twelve) hours., Disp: 14 tablet, Rfl: 0   apixaban  (ELIQUIS ) 2.5 MG TABS tablet, TAKE ONE TABLET TWICE DAILY, Disp: 60 tablet, Rfl: 5   Cholecalciferol  (VITAMIN D ) 2000 UNITS CAPS, Take 2,000 Units by mouth daily. , Disp: , Rfl:    Cyanocobalamin  (VITAMIN B 12 PO), Take 1 tablet by mouth daily., Disp: , Rfl:    Fluticasone -Umeclidin-Vilant (TRELEGY ELLIPTA ) 200-62.5-25 MCG/ACT AEPB, Inhale 1 puff into the lungs daily., Disp: 60 each, Rfl: 11   furosemide  (LASIX ) 40 MG tablet, Take 0.5 tablets (20 mg total) by mouth 2 (two) times daily. <PLEASE MAKE APPOINTMENT FOR REFILLS>, Disp: 60 tablet, Rfl: 1   metoprolol  tartrate (LOPRESSOR ) 25 MG tablet, Take 1 tablet (25 mg total) by mouth 2 (two) times daily. (Patient taking differently: Take 37.5 mg by mouth 2 (two) times daily.), Disp: 60 tablet, Rfl: 0   PROAIR  HFA 108 (90 Base) MCG/ACT inhaler, TAKE 2 PUFFS BY MOUTH EVERY 6 HOURS AS NEEDED FOR WHEEZE OR SHORTNESS OF BREATH (Patient taking differently: Inhale 2 puffs into the lungs every 6 (six) hours as needed for wheezing or shortness of breath.), Disp: 8.5 Inhaler, Rfl: 11   rosuvastatin  (CRESTOR ) 40 MG tablet, Take 40 mg by mouth daily., Disp: , Rfl:   Vitals   . BP (!) 126/59   Pulse 66   Temp 97.8 F (36.6 C) (Oral)   Resp 17   Ht 5' 2 (1.575 m)   Wt 71.8 kg   SpO2 100%   BMI 28.97 kg/m    Physical Exam   Constitutional: Frail-appearing elderly female.  Psych: Somewhat flattened affect.  Eyes: No scleral injection.  HENT: No OP obstruction.  Head: Normocephalic.  Respiratory: Effort normal, non-labored  breathing.    Neurologic Examination   See NIHSS  Labs/Imaging/Neurodiagnostic studies   CBC:  Recent Labs  Lab 11/18/23 0144  HGB 10.2*  HCT 30.0*   Basic Metabolic Panel:  Lab Results  Component Value Date  NA 137 11/04/2023   K 3.7 11/04/2023   CO2 27 10/11/2023   GLUCOSE 119 (H) 11/04/2023   BUN 24 (H) 11/04/2023   CREATININE 1.90 (H) 11/04/2023   CALCIUM  9.4 10/11/2023   GFRNONAA 38 (L) 10/11/2023   GFRAA 41 (L) 12/24/2018   Lipid Panel: No results found for: LDLCALC HgbA1c: No results found for: HGBA1C Urine Drug Screen: No results found for: LABOPIA, COCAINSCRNUR, LABBENZ, AMPHETMU, THCU, LABBARB  Alcohol  Level No results found for: [facility] INR  Lab Results  Component Value Date   INR 1.3 (H) 01/02/2022   APTT  Lab Results  Component Value Date   APTT 36 01/02/2022    ASSESSMENT  Courtney Cline is a 89 y.o. female with a PMHx significant for atrial fibrillation, who presents from home via EMS as a Code Stroke after she was noted to have slurred speech after she slid off her couch she was sleeping in at about 12:50 AM. On EMS arrival she had slurred speech, RLE weakness and was complaining of a left sided headache. She was brought to CT emergently after arrival to the ED. In CT she denies any weakness. She knows that she slid off her couch, but is unable to recall any other details. EMS noted a strong smell of urine suggestive of a UTI. BP per EMS was 110/70. She has no prior history of stroke. She has recently not been compliant with her Eliquis  due to nosebleeds. Last 30-day prescription for Eliquis  was filled in June.  - Exam reveals poor orientation to time. No other deficits noted.  - CT head: No acute intracranial abnormality. ASPECTS is 10. Chronic ischemic white matter changes, mild volume loss, and old cerebellar small vessel infarcts. - Labs:  - Na and k normal.  - Ca 8.6, albumin 2.9. BUN 21, Cr 1.83 - Glucose 117 - LFTs  normal - WBC normal - Low Hgb of 9.7 - Impression: Transient slurred speech after sliding off couch at home. Overall presentation is suggestive of a toxic/metabolic or infectious etiology. TIA is also possible given her atrial fibrillation and noncompliance with Eliquis .   RECOMMENDATIONS  - Urinalysis - IVF - Orthostatics - If the above are negative, obtain MRI brain - Resumption of anticoagulation is indicated, provided that she is determined not to be a falls risk and if she is willing to resume given her history of nosebleeds, and if blood loss from her nosebleeds is minimal. Otherwise, would start ASA.  ______________________________________________________________________    Bonney SHARK, Chelsea Pedretti, MD [facility]

## 2023-11-04 NOTE — ED Notes (Signed)
 RN notified Dr. Marsha Ada about pt BP trending low.

## 2023-11-04 NOTE — ED Triage Notes (Signed)
 Pt BIBA as a stroke alert. Per EMS pt was at home where she was sitting on the couch and attempted to get up to the restroom where she had a fall from the couch to her buttocks. PT then complained of a left sided headache and right sided leg weakness. PT also had slurred speech upon EMS arrival per EMS.

## 2023-11-04 NOTE — H&P (Addendum)
 History and Physical    Patient: Courtney Cline FMW:[id number] DOB: 1933-12-10 DOA: 11/04/2023 DOS: the patient was seen and examined on 11/04/2023 PCP: Elliot Charm, MD  Patient coming from: Home  Chief Complaint: No chief complaint on file.  HPI: Courtney Cline is a 89 y.o. female with medical history significant of breast cancer, COPD, HTN, and HLD p/w altered mental status, confusion, as well as some dysarthria and suspected stroke (ruled out per Neurology) and found to have acute metabolic encephalopathy in the setting of sepsis 2/2 acute cystitis.  The patient was found to have altered mental status as reported by the son, [name]. Last night, the patient's husband noticed the patient rambling and mumbling incoherently before going to bed. The patient expressed a need to urinate but was unable to get up. Attempts to assist the patient to a portable potty resulted in her sliding to the floor and being unable to get back up (no head strike or LOC). The patient's brother and nephew assisted her back to the couch, but noted that the patient was not acting or talking normally. Consequently, an ambulance was called. The exact time of the call was not known, but the son became aware of the situation at 0600. Per son, he last saw his mother at her normal sharp self yesterday at 1800 and notes that a few years back she did have difficulty speaking when she had a UTI at that time.  Of note, she initially presented as a code stroke. CT head showed no acute process. She was evaluated by Dr. Merrianne of neurology, who felt that this presentation was more consistent with a metabolic process as opposed to a TIA or acute ischemic stroke. He recommended further evaluation in the potential metabolic contributing factors, and did not feel that additional stroke evaluation was warranted at this time.   In the ED, pt tachycadic and tachypneic W/O hypoxia. Labs notable for Cr 1.8-->1.6, and UA  w/ large LE. CXR w/ concern for mild basilar opacity c/f atalectasis. EDP started IV CTX/azithromycin  and requested medicine admission.   Review of Systems: As mentioned in the history of present illness. All other systems reviewed and are negative. Past Medical History:  Diagnosis Date   Allergic rhinitis    Arthritis    Breast calcification seen on mammogram    Right breast   Breast cancer (HCC)    COPD (chronic obstructive pulmonary disease) (HCC)    GERD (gastroesophageal reflux disease)    Hyperglycemia    Hyperlipidemia    Hypertension    Low back pain    On home oxygen  therapy    all the time   Osteopenia    Peripheral edema    Shortness of breath    Subclavian artery stenosis, left (HCC)    Vitamin D  deficiency    Wears dentures    top   Wears glasses    reading   Past Surgical History:  Procedure Laterality Date   BREAST BIOPSY     BREAST LUMPECTOMY     right 2015   BREAST LUMPECTOMY WITH RADIOACTIVE SEED LOCALIZATION Right 12/20/2013   Procedure: RIGHT BREAST LUMPECTOMY WITH RADIOACTIVE SEED LOCALIZATION;  Surgeon: Debby Shipper, MD;  Location: [facility];  Service: General;  Laterality: Right;   CAROTID DUPLEX  2014   CATARACT EXTRACTION     both eyes   Social History:  reports that she quit smoking about 29 years ago. Her smoking use included cigarettes. She started smoking about  69 years ago. She has a 40 pack-year smoking history. She has never used smokeless tobacco. She reports that she does not drink alcohol  and does not use drugs.  No Known Allergies  Family History  Problem Relation Age of Onset   Emphysema Mother    Emphysema Maternal Grandfather     Prior to Admission medications   Medication Sig Start Date End Date Taking? Authorizing Provider  hydrALAZINE  (APRESOLINE ) 25 MG tablet Take 25 mg by mouth 2 (two) times daily. 10/23/23  Yes [provider]  OXYGEN  Inhale 4 L into the lungs continuous.   Yes [provider]  albuterol  (VENTOLIN  HFA) 108 (90 Base) MCG/ACT inhaler INHALE TWO PUFFS into THE lungs EVERY 6 HOURS AS NEEDED FOR wheezing OR SHORTNESS OF BREATH 10/24/23   Kassie Acquanetta Bradley, MD  apixaban  (ELIQUIS ) 2.5 MG TABS tablet TAKE ONE TABLET TWICE DAILY 03/20/23   Court Dorn PARAS, MD  Cholecalciferol  (VITAMIN D ) 2000 UNITS CAPS Take 2,000 Units by mouth daily.     [provider]  Cyanocobalamin  (VITAMIN B 12 PO) Take 1 tablet by mouth daily.    [provider]  Fluticasone -Umeclidin-Vilant (TRELEGY ELLIPTA ) 200-62.5-25 MCG/ACT AEPB Inhale 1 puff into the lungs daily. 04/27/23   Kassie Acquanetta Bradley, MD  rosuvastatin  (CRESTOR ) 40 MG tablet Take 40 mg by mouth daily. 02/02/21   [provider]    Physical Exam: Vitals:   11/04/23 0405 11/04/23 0410 11/04/23 0415 11/04/23 0500  BP:    107/72  Pulse: (!) 147 (!) 147 (!) 141 61  Resp: (!) 25 20 18 20   Temp:      TempSrc:      SpO2: 100% 100% 100% 93%  Weight:      Height:       General: Alert, oriented x3, resting comfortably in no acute distress Respiratory: Lungs clear to auscultation bilaterally with normal respiratory effort; no w/r/r Cardiovascular: Regular rate and rhythm w/o m/r/g   Data Reviewed:  Lab Results  Component Value Date   WBC 8.1 11/04/2023   HGB 9.3 (L) 11/04/2023   HCT 30.9 (L) 11/04/2023   MCV 95.7 11/04/2023   PLT 266 11/04/2023   Lab Results  Component Value Date   GLUCOSE 134 (H) 11/04/2023   CALCIUM  8.6 (L) 11/04/2023   NA 139 11/04/2023   K 4.0 11/04/2023   CO2 24 11/04/2023   CL 103 11/04/2023   BUN 21 11/04/2023   CREATININE 1.66 (H) 11/04/2023   Lab Results  Component Value Date   ALT 6 11/04/2023   AST 19 11/04/2023   ALKPHOS 58 11/04/2023   BILITOT 0.8 11/04/2023   Lab Results  Component Value Date   INR 1.0 11/04/2023   INR 1.3 (H) 01/02/2022   INR 1.2 02/15/2021   Radiology: DG Chest Portable 1 View Result Date: 11/04/2023 EXAM: 1 VIEW XRAY OF  THE CHEST 11/04/2023 02:43:53 AM COMPARISON: 10/11/2023 CLINICAL HISTORY: painetc. FINDINGS: LUNGS AND PLEURA: Mild bibasilar opacities, likely atelectasis. No pleural effusion. No pneumothorax. HEART AND MEDIASTINUM: Stable mild cardiomegaly. Thoracic aortic atherosclerosis. BONES AND SOFT TISSUES: No acute osseous abnormality. IMPRESSION: 1. Mild bibasilar opacities, likely atelectasis. 2. No acute abnormalities. Electronically signed by: Pinkie Pebbles MD 11/04/2023 02:47 AM EDT RP Workstation: [id number]   CT HEAD CODE STROKE WO CONTRAST Result Date: 11/04/2023 EXAM: CT HEAD WITHOUT CONTRAST 11/04/2023 01:51:22 AM TECHNIQUE: CT of the head was performed without the administration of intravenous contrast. Automated exposure control, iterative reconstruction, and/or weight based adjustment  of the mA/kV was utilized to reduce the radiation dose to as low as reasonably achievable. COMPARISON: None available. CLINICAL HISTORY: Neuro deficit, acute, stroke suspected. Stroke; Dr. Merrianne FINDINGS: BRAIN AND VENTRICLES: Chronic ischemic white matter changes. Mild volume loss. ASPECTS is 10. Old cerebellar small vessel infarcts. No acute hemorrhage. No evidence of acute infarct. No hydrocephalus. No extra-axial collection. No mass effect or midline shift. ORBITS: No acute abnormality. SINUSES: No acute abnormality. SOFT TISSUES AND SKULL: No acute soft tissue abnormality. No skull fracture. IMPRESSION: 1. No acute intracranial abnormality. ASPECTS is 10. 2. Chronic ischemic white matter changes, mild volume loss, and old cerebellar small vessel infarcts. 3. Findings communicated to Dr. Lindzen at 2:01 am on 11/04/2023. Electronically signed by: Franky Stanford MD 11/04/2023 02:02 AM EDT RP Workstation: [id number]    Assessment and Plan: 55F h/o Afib on Eliquis , breast cancer, COPD, HTN, and HLD p/w altered mental status, confusion, as well as some dysarthria and suspected stroke (ruled out per Neurology) and found to  have acute metabolic encephalopathy iso sepsis 2/2 acute cystitis.  Acute metabolic encephalopathy iso sepsis 2/2 acute cystitis Low suspicion for CAP iso no O2 requirement, and np consolidations on CXR -MIVF w/ NS at 100cc/h -Continue IV CTX 1g daily -D/c azithromycin  -F/u urine culture and adjust abx prn -F/u MRI brain WO to exclude acute stroke given ongoing dysarthria per son UPDATE: MRI showed Acute left thalamocapsular infarct. Neurology -consulted (Dr. Merrianne updated via Epic SecureChat); awaiting recs.  Afib -PTA Eliquis  2.5mg  BID  COPD -Breztri  and Duonebs prn   HTN -PTA hydralazine  25mg  BID  HLD -PTA Crestor  40mg  daily   Advance Care Planning:   Code Status: Limited: Do not attempt resuscitation (DNR) -DNR-LIMITED -Do Not Intubate/DNI    Consults: [name]  Family Communication: Son and husband  Severity of Illness: The appropriate patient status for this patient is INPATIENT. Inpatient status is judged to be reasonable and necessary in order to provide the required intensity of service to ensure the patient's safety. The patient's presenting symptoms, physical exam findings, and initial radiographic and laboratory data in the context of their chronic comorbidities is felt to place them at high risk for further clinical deterioration. Furthermore, it is not anticipated that the patient will be medically stable for discharge from the hospital within 2 midnights of admission.   * I certify that at the point of admission it is my clinical judgment that the patient will require inpatient hospital care spanning beyond 2 midnights from the point of admission due to high intensity of service, high risk for further deterioration and high frequency of surveillance required.*   ------- I spent 61 minutes reviewing previous notes, at the bedside counseling/discussing the treatment plan, and performing clinical documentation.  Author: Marsha Ada, MD 11/04/2023 8:10 AM  For on  call review [email or web address].

## 2023-11-04 NOTE — ED Notes (Signed)
 NT change pt brief.   RN and NT repositioned pt in bed.

## 2023-11-04 NOTE — ED Notes (Signed)
 Reporting RN stated Code Stroke was canceled. RN seen attending verified NIH canceled via secured chat with reporting RN

## 2023-11-04 NOTE — Progress Notes (Signed)
  Carryover admission to the Day Admitter.  I discussed this case with the EDP, Dr. Palumbo.  Per these discussions:   This is a 89 year old female who is being admitted with suspected acute metabolic encephalopathy in the setting of acute cystitis presenting with altered mental status, confusion, as well as some dysarthria.  She was noted to be able to identify correctly her name as well as the current [place]  president.she knows that she is in the hospital, but is not oriented to time.   She had initially presented as a code stroke. CT head showed no acute process. She was evaluated by Dr. Merrianne of neurology, who felt that this presentation was more consistent with a metabolic process as opposed to a TIA or acute ischemic stroke. He recommended further evaluation in the potential metabolic contributing factors, and did not feel that additional stroke evaluation was warranted at this time.  Urinalysis is reported to be consistent with urinary tract infection for which she was started on Rocephin .  Additionally, chest x-ray was reported to show evidence of basilar opacity, which was read as infiltrate versus atelectasis.  EDP added azithromycin  to existing Rocephin  for empiric coverage of potential community-acquired pneumonia.  Will check procalcitonin level to further assess.   It is noted that blood cultures and urine culture were collected in the ED.  I have placed an order for observation to med/tele for further evaluation management of the above.  I have placed some additional preliminary admit orders via the adult multi-morbid admission order set. I have also ordered continuation of existing orders for Rocephin  and azithromycin , and have added on a procalcitonin level to further assess chest x-ray finding of basilar opacity.  We have also ordered morning labs in the form of CMP, CBC, magnesium level. Additionally, based upon code status documentation from two most recent hospitalizations, I have  entered pt's code status as dnr/dni.    Eva Pore, DO Hospitalist

## 2023-11-04 NOTE — ED Notes (Signed)
 Patient transported to MRI

## 2023-11-04 NOTE — ED Notes (Signed)
 Pt ate one bite of chicken breast, one spoonful of pudding, and a few sips of tea. Son states pt appetite has decreased lately.  RN rechecked BP on left arm but son states that side may have a blockage. The best readings are on the right side per pt and son.

## 2023-11-04 NOTE — ED Notes (Signed)
Lunch Tray delivered 

## 2023-11-05 ENCOUNTER — Observation Stay (HOSPITAL_COMMUNITY)

## 2023-11-05 ENCOUNTER — Observation Stay (HOSPITAL_BASED_OUTPATIENT_CLINIC_OR_DEPARTMENT_OTHER)

## 2023-11-05 DIAGNOSIS — Z91199 Patient's noncompliance with other medical treatment and regimen due to unspecified reason: Secondary | ICD-10-CM | POA: Diagnosis not present

## 2023-11-05 DIAGNOSIS — I10 Essential (primary) hypertension: Secondary | ICD-10-CM | POA: Diagnosis not present

## 2023-11-05 DIAGNOSIS — I639 Cerebral infarction, unspecified: Secondary | ICD-10-CM | POA: Diagnosis not present

## 2023-11-05 DIAGNOSIS — N179 Acute kidney failure, unspecified: Secondary | ICD-10-CM | POA: Diagnosis not present

## 2023-11-05 DIAGNOSIS — E785 Hyperlipidemia, unspecified: Secondary | ICD-10-CM | POA: Diagnosis not present

## 2023-11-05 DIAGNOSIS — I480 Paroxysmal atrial fibrillation: Secondary | ICD-10-CM | POA: Diagnosis not present

## 2023-11-05 DIAGNOSIS — G9341 Metabolic encephalopathy: Secondary | ICD-10-CM | POA: Diagnosis not present

## 2023-11-05 DIAGNOSIS — G459 Transient cerebral ischemic attack, unspecified: Secondary | ICD-10-CM | POA: Diagnosis not present

## 2023-11-05 LAB — HEMOGLOBIN A1C
Hgb A1c MFr Bld: 5.2 % (ref 4.8–5.6)
Mean Plasma Glucose: 102.54 mg/dL

## 2023-11-05 LAB — URINE CULTURE
Culture: <10000 — AB
Special Requests: NORMAL

## 2023-11-05 MED ORDER — ATORVASTATIN CALCIUM 80 MG PO TABS
80.0000 mg | ORAL_TABLET | Freq: Every day | ORAL | Status: DC
  Administered 2023-11-06 – 2023-11-08 (×3): 80 mg via ORAL
  Filled 2023-11-05 (×3): qty 1

## 2023-11-05 MED ORDER — METOPROLOL SUCCINATE ER 25 MG PO TB24
25.0000 mg | ORAL_TABLET | Freq: Every day | ORAL | Status: DC
  Administered 2023-11-05 – 2023-11-06 (×3): 25 mg via ORAL
  Filled 2023-11-05 (×4): qty 1

## 2023-11-05 MED ORDER — METHOCARBAMOL 500 MG PO TABS
500.0000 mg | ORAL_TABLET | Freq: Once | ORAL | Status: AC | PRN
  Administered 2023-11-05: 500 mg via ORAL
  Filled 2023-11-05: qty 1

## 2023-11-05 MED ORDER — SENNA 8.6 MG PO TABS
1.0000 | ORAL_TABLET | Freq: Every day | ORAL | Status: DC | PRN
  Administered 2023-11-08: 8.6 mg via ORAL
  Filled 2023-11-05: qty 1

## 2023-11-05 NOTE — Progress Notes (Addendum)
 STROKE TEAM PROGRESS NOTE   INTERIM HISTORY/SUBJECTIVE She is supposed to be on Eliquis  2.5 mg twice daily, she reports she usually takes that 1 time per day because she has had problems with nosebleeds in the past but lately has not taken her Eliquis .  She has followed with ENT.  She is not currently having any focal deficit.  Recommend outpatient follow-up with cardiology to consider Watchman device as she is already on low-dose anticoagulation.  [facility]  OBJECTIVE  CBC    Component Value Date/Time   WBC 8.1 11/04/2023 0435   RBC 3.23 (L) 11/04/2023 0435   HGB 9.3 (L) 11/04/2023 0435   HGB 9.2 (L) 10/04/2023 1432   HGB 9.6 (L) 11/07/2016 0932   HCT 30.9 (L) 11/04/2023 0435   HCT 31.0 (L) 11/07/2016 0932   PLT 266 11/04/2023 0435   PLT 193 10/04/2023 1432   PLT 205 11/07/2016 0932   MCV 95.7 11/04/2023 0435   MCV 96.6 11/07/2016 0932   MCH 28.8 11/04/2023 0435   MCHC 30.1 11/04/2023 0435   RDW 13.5 11/04/2023 0435   RDW 13.4 11/07/2016 0932   LYMPHSABS 0.5 (L) 11/04/2023 0435   LYMPHSABS 1.3 11/07/2016 0932   MONOABS 0.4 11/04/2023 0435   MONOABS 0.4 11/07/2016 0932   EOSABS 0.1 11/04/2023 0435   EOSABS 0.2 11/07/2016 0932   BASOSABS 0.0 11/04/2023 0435   BASOSABS 0.0 11/07/2016 0932    BMET    Component Value Date/Time   NA 139 11/04/2023 0435   NA 142 11/07/2016 0932   K 4.0 11/04/2023 0435   K 4.3 11/07/2016 0932   CL 103 11/04/2023 0435   CO2 24 11/04/2023 0435   CO2 30 (H) 11/07/2016 0932   GLUCOSE 134 (H) 11/04/2023 0435   GLUCOSE 118 11/07/2016 0932   BUN 21 11/04/2023 0435   BUN 28.2 (H) 11/07/2016 0932   CREATININE 1.66 (H) 11/04/2023 0435   CREATININE 1.30 (H) 07/20/2022 1427   CREATININE 1.3 (H) 11/07/2016 0932   CALCIUM  8.6 (L) 11/04/2023 0435   CALCIUM  9.7 11/07/2016 0932   EGFR 40 (L) 11/07/2016 0932   GFRNONAA 29 (L) 11/04/2023 0435   GFRNONAA 40 (L) 07/20/2022 1427    IMAGING past 24 hours MR ANGIO HEAD  WO CONTRAST Result Date: 11/05/2023 EXAM: MR Angiography Head without intravenous Contrast. 11/05/2023 01:57:00 AM TECHNIQUE: Magnetic resonance angiography images of the head without intravenous contrast. Multiplanar 2D and 3D reformatted images are provided for review. COMPARISON: None provided. CLINICAL HISTORY: Stroke/TIA, determine embolic source. FINDINGS: ANTERIOR CIRCULATION: No significant stenosis of the internal carotid arteries. No significant stenosis of the anterior cerebral arteries. No significant stenosis of the middle cerebral arteries. No aneurysm. POSTERIOR CIRCULATION: There is occlusion of the left PCA distal P2 segment with intermittent distal reconstitution. No significant stenosis of the basilar artery. No significant stenosis of the vertebral arteries. No aneurysm. IMPRESSION: 1. Left PCA P2 segment occlusion with intermittent distal reconstitution. Electronically signed by: Franky Stanford MD 11/05/2023 02:11 AM EDT RP Workstation: [id number]   MR BRAIN WO CONTRAST Result Date: 11/04/2023 CLINICAL DATA:  Altered mental status, nontraumatic (Ped 0-17y) EXAM: MRI HEAD WITHOUT CONTRAST TECHNIQUE: Multiplanar, multiecho pulse sequences of the brain and surrounding structures were obtained without intravenous contrast. COMPARISON:  CT head from earlier today. FINDINGS: Brain: Acute left thalamocapsular infarct. No mass effect. No midline shift. No acute hemorrhage. Moderate T2/FLAIR hyperintensities in the white matter which are compatible with chronic microvascular ischemic change.  No mass lesion. Small amount of curvilinear susceptibility artifact along the posterior parietooccipital convexities bilaterally suggestive of superficial siderosis. Vascular: Major arterial flow voids are maintained at the skull base. Skull and upper cervical spine: Normal marrow signal. Sinuses/Orbits: Clear sinuses.  No acute orbital findings. Other: No mastoid effusions IMPRESSION: 1. Acute left thalamocapsular  infarct. 2. Moderate chronic microvascular disease. 3. Suspected small amount of superficial siderosis along the parieto-occipital convexities. Electronically Signed   By: Gilmore GORMAN Molt M.D.   On: 11/04/2023 15:43    Vitals:   11/05/23 0747 11/05/23 0806 11/05/23 1224 11/05/23 1241  BP: (!) 142/78  (!) 157/144 124/82  Pulse: (!) 49 (!) 114 (!) 138   Resp: 18  18 18   Temp: 98.1 F (36.7 C)  98.8 F (37.1 C)   TempSrc: Oral  Oral   SpO2: 100%  98%   Weight:      Height:         PHYSICAL EXAM General:  Alert, well-nourished, well-developed patient in no acute distress Psych:  Mood and affect appropriate for situation CV: Regular rate and rhythm on monitor Respiratory:  Regular, unlabored respirations on room air GI: Abdomen soft and nontender   NEURO:  Mental Status: Alert and oriented to self- initially says 6 as age but corrects herself, year 2026, [place].  Patient is able to give clear and coherent history Reports that she feels generally weak\  Speech/Language: speech is without dysarthria or aphasia.  Naming, repetition, fluency, and comprehension intact.  Cranial Nerves:  II: PERRL. Visual fields full.  III, IV, VI: EOMI. Eyelids elevate symmetrically.  V: Sensation is intact to light touch and symmetrical to face.  VII: Face is symmetrical resting and smiling VIII: hearing intact to voice. IX, X: Palate elevates symmetrically. Phonation is normal.  KP:Dynloizm shrug 5/5. XII: tongue is midline without fasciculations. Motor: Reports generalized weakness but no focal weakness Tone: is normal and bulk is normal Sensation- Intact to light touch bilaterally. Extinction absent to light touch to DSS.   Coordination: FTN intact bilaterally, HKS: no ataxia in BLE.No drift.  Gait- deferred  Most Recent NIH 0     ASSESSMENT/PLAN  Courtney Cline is a 89 y.o. female with history of breast cancer, COPD, HTN, and HLD p/w altered mental status,  confusion, as well as some dysarthria.  NIH on Admission 2  Acute Ischemic Infarct:  left thalamocapsular infarct  Etiology: A-fib noncompliant with anticoagulation CT head-no acute intracranial abnormality.  Aspects is a 10 MRI  Acute left thalamocapsular infarct.  MRA  Left PCA P2 segment occlusion with intermittent distal reconstitution.  Carotid Doppler pending 2D Echo pending LDL pending HgbA1c 5.2 VTE prophylaxis -Eliquis  Eliquis  (apixaban ) daily prior to admission, now on Eliquis  (apixaban ) daily  Therapy recommendations:  Pending Disposition: Pending  Atrial fibrillation Home Meds: Eliqius  Reports taking eliquis  once per day due to nose bleeds but lately she has not been taking it.  She does tell me that her last nosebleed was a couple weeks ago, sometimes they are heavy and sometimes they are not.  She has seen ENT.  At that time she had a chemical cauterization and had only a small amount of nasal bleeding after that.  She was on Eliquis  at that time as well.  Additionally they noted that she had a lot of crusting and was on oxygen  without a humidifier. Eliquis  has been resumed inpatient, no complaints of nosebleeding at this time Continue telemetry monitoring  Hypertension Home meds:  Lasix , hydralazine , metoprolol  Stable Blood Pressure Goal: BP less than 220/110   Hyperlipidemia Home meds: crestor  40mg  Resume  LDL pending goal < 70 Continue statin at discharge  Dysphagia Patient has post-stroke dysphagia, SLP consulted    Diet   DIET DYS 3 Room service appropriate? Yes with Assist; Fluid consistency: Thin   Advance diet as tolerated  Other Active Problems Acute cystitis Currently on ceftriaxone  1 g daily Urine culture pending  Hospital day # 0  Patient seen and examined by NP/APP with MD. MD to update note as needed.   Jorene Last, DNP, FNP-BC [facility] Pager: [phone]   ATTENDING NOTE: I reviewed above note and agree with the  assessment and plan. Pt was seen and examined.   Patient seen and evaluate with NP. Presented due to confusion, possible UTI and MRI showed a left thalamocapsular stroke. Etiology likely atrial fibrillation in the setting of Eliquis  non adherence. We will restart her Eliquis  2.5 mg twice daily while inpatient and patient will follow up with cardiology. She is already on Crestor , will continue at discharge.  Fortunately for patient, no major deficit from her stroke.  Will sign off, please call us  for any additional questions or concerns. I spent a total of 35 minutes dedicated to the care of this patient.   For detailed assessment and plan, please refer to above as I have made changes wherever appropriate.   Pastor Falling, MD  Stroke Neurology 11/05/2023 2:51 PM     To contact Stroke Continuity provider, please refer to [email or web address]. After hours, contact General Neurology

## 2023-11-05 NOTE — Progress Notes (Signed)
 Inpatient Rehab Admissions Coordinator Note:   Per therapy patient was screened for CIR candidacy by Chaysen Tillman SHAUNNA Yvone Cohens, CCC-SLP. At this time, pt appears to be a potential candidate for CIR. I will place an order for rehab consult for full assessment, per our protocol.  Please contact me any with questions.SABRA Tinnie Yvone Cohens, MS, CCC-SLP Admissions Coordinator [phone] 11/05/23 3:43 PM

## 2023-11-05 NOTE — Plan of Care (Signed)
  Problem: Education: Goal: Knowledge of General Education information will improve Description: Including pain rating scale, medication(s)/side effects and non-pharmacologic comfort measures Outcome: Progressing   Problem: Clinical Measurements: Goal: Will remain free from infection Outcome: Progressing Goal: Respiratory complications will improve Outcome: Progressing   Problem: Coping: Goal: Level of anxiety will decrease Outcome: Progressing   

## 2023-11-05 NOTE — Care Management Obs Status (Signed)
 MEDICARE OBSERVATION STATUS NOTIFICATION   Patient Details  Name: Arta Stump MRN: [id number] Date of Birth: November 01, 1933   Medicare Observation Status Notification Given:  Yes    Heydi Swango G., RN 11/05/2023, 9:26 AM

## 2023-11-05 NOTE — Evaluation (Addendum)
 Physical Therapy Evaluation Patient Details Name: Courtney Cline MRN: [id number] DOB: 07-Feb-1934 Today's Date: 11/05/2023  History of Present Illness  89 y/o F presenting to ED on 9/20 as code stroke with slurred speech, RLE weakness, and fell off her cough. Found to have acute metabolic encephalopathy in the setting of sepsis 2/2 acute cystitis  Left PCA P2 segment occlusion with intermittent distal reconstitution.  PMH includes A fib, breast CA, COPD, HTN, HLD,   Clinical Impression  Pt in bed upon arrival with son present and agreeable to PT eval. PTA, pt was ModI with RW for mobility and had assist to negotiate steps. Pt presents with R sided weakness, impaired cardiopulmomary status, and impaired balance. Pt required ModA for bed mobility with a right lateral and posterior lean upon seated at EOB. Multiple losses of balance requiring MaxA to correct. Pt was able to stand with ModAx2 and RW, however, began to feel dizzy with quick return to seated position, vitals below with RN notified. Pt has 24/7 assist available upon return home. Recommending post-acute rehab >3hrs to work towards independence with mobility. Pt would benefit from acute skilled PT with current functional limitations listed below (see PT Problem List). Acute PT to follow.    Supine BP- 85/71 (78), 155 BPM HR 126-158 BPM      If plan is discharge home, recommend the following: A lot of help with walking and/or transfers;A lot of help with bathing/dressing/bathroom;Assist for transportation;Help with stairs or ramp for entrance   Can travel by private vehicle   No    Equipment Recommendations None recommended by PT     Functional Status Assessment Patient has had a recent decline in their functional status and demonstrates the ability to make significant improvements in function in a reasonable and predictable amount of time.     Precautions / Restrictions Precautions Precautions: Fall Recall of  Precautions/Restrictions: Impaired Precaution/Restrictions Comments: watch HR and low BP, 4L at home Restrictions Weight Bearing Restrictions Per Provider Order: No      Mobility  Bed Mobility Overal bed mobility: Needs Assistance Bed Mobility: Rolling, Sidelying to Sit, Sit to Supine Rolling: Min assist, Used rails Sidelying to sit: Mod assist, Used rails   Sit to supine: Min assist   General bed mobility comments: MinA to roll with cues to reach for bed rail, ModA to raise trunk. MinA for return to supine for slight R LE    Transfers Overall transfer level: Needs assistance Equipment used: Rolling walker (2 wheels) Transfers: Sit to/from Stand Sit to Stand: Mod assist, +2 physical assistance, +2 safety/equipment      General transfer comment: ModAx2 for boost-up and to steady, pt started feeling lightheaded with return to sitting    Ambulation/Gait    General Gait Details: deferred 2/2 symptoms  Modified Rankin (Stroke Patients Only) Modified Rankin (Stroke Patients Only) Pre-Morbid Rankin Score: No symptoms Modified Rankin: Moderately severe disability     Balance Overall balance assessment: Needs assistance, Mild deficits observed, not formally tested Sitting-balance support: No upper extremity supported, Feet supported Sitting balance-Leahy Scale: Poor Sitting balance - Comments: MaxA for occasional posterior and right lateral losses of balance. Able to correct with cueing however unable to maintain without MinA Postural control: Posterior lean, Right lateral lean Standing balance support: Bilateral upper extremity supported, During functional activity, Reliant on assistive device for balance Standing balance-Leahy Scale: Poor Standing balance comment: reliant on UE and external support       Pertinent Vitals/Pain Pain Assessment Pain Assessment:  Faces Faces Pain Scale: Hurts little more Pain Location: B LEs Pain Descriptors / Indicators: Aching,  Discomfort Pain Intervention(s): Limited activity within patient's tolerance, Monitored during session, Repositioned    Home Living Family/patient expects to be discharged to:: Private residence Living Arrangements: Spouse/significant other Available Help at Discharge: Family;Available 24 hours/day Type of Home: House Home Access: Stairs to enter Entrance Stairs-Rails: Can reach both Entrance Stairs-Number of Steps: 3   Home Layout: One level Home Equipment: Agricultural consultant (2 wheels);Shower seat;Wheelchair - manual;BSC/3in1      Prior Function Prior Level of Function : History of Falls (last six months);Needs assist    Mobility Comments: ModI with RW, family assists with stair negotiation ADLs Comments: ind, sons help to drive     Extremity/Trunk Assessment   Upper Extremity Assessment Upper Extremity Assessment: Defer to OT evaluation    Lower Extremity Assessment Lower Extremity Assessment: RLE deficits/detail;LLE deficits/detail RLE Deficits / Details: Hip flexion 3+/5, Knee ext 4+/5, Ankle DF 3/5 RLE Sensation: WNL LLE Deficits / Details: Hip flexion 4/5, Knee ext 4+/5, Ankle DF 3+/5 LLE Sensation: WNL    Cervical / Trunk Assessment Cervical / Trunk Assessment: Kyphotic  Communication   Communication Communication: No apparent difficulties    Cognition Arousal: Alert Behavior During Therapy: WFL for tasks assessed/performed   PT - Cognitive impairments: No apparent impairments    Following commands: Intact       Cueing Cueing Techniques: Verbal cues     General Comments General comments (skin integrity, edema, etc.): Son present and supportive during session     PT Assessment Patient needs continued PT services  PT Problem List Decreased strength;Decreased activity tolerance;Decreased balance;Decreased mobility;Cardiopulmonary status limiting activity       PT Treatment Interventions DME instruction;Gait training;Stair training;Therapeutic  activities;Functional mobility training;Therapeutic exercise;Neuromuscular re-education;Balance training;Patient/family education    PT Goals (Current goals can be found in the Care Plan section)  Acute Rehab PT Goals Patient Stated Goal: to gain more independence with mobility PT Goal Formulation: With patient/family Time For Goal Achievement: 11/19/23 Potential to Achieve Goals: Good    Frequency Min 2X/week     Co-evaluation PT/OT/SLP Co-Evaluation/Treatment: Yes Reason for Co-Treatment: For patient/therapist safety;To address functional/ADL transfers PT goals addressed during session: Mobility/safety with mobility;Balance;Proper use of DME         AM-PAC PT 6 Clicks Mobility  Outcome Measure Help needed turning from your back to your side while in a flat bed without using bedrails?: A Lot Help needed moving from lying on your back to sitting on the side of a flat bed without using bedrails?: A Lot Help needed moving to and from a bed to a chair (including a wheelchair)?: Total Help needed standing up from a chair using your arms (e.g., wheelchair or bedside chair)?: Total Help needed to walk in hospital room?: Total Help needed climbing 3-5 steps with a railing? : Total 6 Click Score: 8    End of Session Equipment Utilized During Treatment: Gait belt Activity Tolerance: Patient tolerated treatment well Patient left: in bed;with call bell/phone within reach;with bed alarm set;with family/visitor present Nurse Communication: Mobility status;Other (comment) (BP and HR readings) PT Visit Diagnosis: Unsteadiness on feet (R26.81);Other abnormalities of gait and mobility (R26.89);Muscle weakness (generalized) (M62.81);History of falling (Z91.81)    Time: 0935-1000 PT Time Calculation (min) (ACUTE ONLY): 25 min   Charges:   PT Evaluation $PT Eval Low Complexity: 1 Low   PT General Charges $$ ACUTE PT VISIT: 1 Visit  Kate ORN, PT, DPT Secure Chat [facility]erred  [facility]  Office [phone]   Kate BRAVO Wendolyn 11/05/2023, 12:59 PM

## 2023-11-05 NOTE — Plan of Care (Signed)
 Assisted with toileting per bedpan. Family at bedside helping with meals. Therapy states BP low so did not work with her today.    Problem: Education: Goal: Knowledge of General Education information will improve Description: Including pain rating scale, medication(s)/side effects and non-pharmacologic comfort measures Outcome: Progressing   Problem: Nutrition: Goal: Adequate nutrition will be maintained Outcome: Progressing   Problem: Safety: Goal: Ability to remain free from injury will improve Outcome: Progressing   Problem: Skin Integrity: Goal: Risk for impaired skin integrity will decrease Outcome: Progressing

## 2023-11-05 NOTE — Evaluation (Signed)
 Occupational Therapy Evaluation Patient Details Name: Courtney Cline MRN: [id number] DOB: 1933-04-21 Today's Date: 11/05/2023   History of Present Illness   89 y/o F presenting to ED on 9/20 as code stroke with slurred speech, RLE weakness, and fell off her cough. Found to have acute metabolic encephalopathy in the setting of sepsis 2/2 acute cystitis  Left PCA P2 segment occlusion with intermittent distal reconstitution.  PMH includes A fib, breast CA, COPD, HTN, HLD,     Clinical Impressions Pt reports using RW at baseline and ind with ADLs, pt lives with spouse. Pt currently needs min-mod A for ADLs, min-mod A for bed mobility and mod +2 for transfers with RW. Pt with mild RUE deficits (4/5 compared to LUE). Pt tachy 110-156bpm during session, with soft BP 85/71 (78), and Spo2 100% on baseline 4L O2. Further mobility deferred due to hypotension and tachycardia. Pt presenting with impairments listed below, will follow acutely. Patient will benefit from intensive inpatient follow-up therapy, >3 hours/day to maximize safety/ind with ADL/functional mobility.      If plan is discharge home, recommend the following:   A lot of help with bathing/dressing/bathroom;Assistance with cooking/housework;Direct supervision/assist for financial management;Direct supervision/assist for medications management;A lot of help with walking and/or transfers;Assist for transportation;Help with stairs or ramp for entrance     Functional Status Assessment   Patient has had a recent decline in their functional status and demonstrates the ability to make significant improvements in function in a reasonable and predictable amount of time.     Equipment Recommendations   Other (comment) (defer)     Recommendations for Other Services   PT consult;Rehab consult     Precautions/Restrictions   Precautions Precautions: Fall Recall of Precautions/Restrictions: Impaired Precaution/Restrictions  Comments: watch HR and low BP, 4L at home Restrictions Weight Bearing Restrictions Per Provider Order: No     Mobility Bed Mobility Overal bed mobility: Needs Assistance Bed Mobility: Rolling, Sidelying to Sit, Sit to Supine Rolling: Min assist, Used rails Sidelying to sit: Mod assist, Used rails   Sit to supine: Min assist        Transfers Overall transfer level: Needs assistance Equipment used: Rolling walker (2 wheels) Transfers: Sit to/from Stand Sit to Stand: Mod assist, +2 physical assistance, +2 safety/equipment                  Balance Overall balance assessment: Needs assistance, Mild deficits observed, not formally tested Sitting-balance support: No upper extremity supported, Feet supported Sitting balance-Leahy Scale: Poor Sitting balance - Comments: MaxA for occasional posterior and right lateral losses of balance. Able to correct with cueing however unable to maintain without MinA Postural control: Posterior lean, Right lateral lean Standing balance support: Bilateral upper extremity supported, During functional activity, Reliant on assistive device for balance Standing balance-Leahy Scale: Poor Standing balance comment: reliant on UE and external support                           ADL either performed or assessed with clinical judgement   ADL Overall ADL's : Needs assistance/impaired Eating/Feeding: Set up;Sitting   Grooming: Minimal assistance;Sitting   Upper Body Bathing: Moderate assistance   Lower Body Bathing: Moderate assistance   Upper Body Dressing : Moderate assistance   Lower Body Dressing: Moderate assistance   Toilet Transfer: Moderate assistance;+2 for physical assistance   Toileting- Clothing Manipulation and Hygiene: Moderate assistance       Functional mobility during ADLs: Moderate  assistance;+2 for physical assistance;Rolling walker (2 wheels)       Vision   Additional Comments: not formally assessed      Perception Perception: Not tested       Praxis Praxis: Not tested       Pertinent Vitals/Pain Pain Assessment Pain Assessment: Faces Pain Score: 4  Faces Pain Scale: Hurts little more Pain Location: B LEs Pain Descriptors / Indicators: Aching, Discomfort Pain Intervention(s): Limited activity within patient's tolerance     Extremity/Trunk Assessment Upper Extremity Assessment Upper Extremity Assessment: Right hand dominant;Generalized weakness;RUE deficits/detail RUE Deficits / Details: 4/5 globally compared to LUE   Lower Extremity Assessment Lower Extremity Assessment: Defer to PT evaluation   Cervical / Trunk Assessment Cervical / Trunk Assessment: Kyphotic   Communication Communication Communication: No apparent difficulties   Cognition Arousal: Alert Behavior During Therapy: WFL for tasks assessed/performed Cognition: No apparent impairments                               Following commands: Intact       Cueing  General Comments   Cueing Techniques: Verbal cues  HR tachy to 110-156bpm during session, SBP 85   Exercises     Shoulder Instructions      Home Living Family/patient expects to be discharged to:: Private residence Living Arrangements: Spouse/significant other Available Help at Discharge: Family;Available 24 hours/day Type of Home: House Home Access: Stairs to enter [employer] of Steps: 3 Entrance Stairs-Rails: Can reach both Home Layout: One level     Bathroom Shower/Tub: Tub/shower unit         Home Equipment: Agricultural consultant (2 wheels);Shower seat;Wheelchair - manual;BSC/3in1          Prior Functioning/Environment Prior Level of Function : History of Falls (last six months);Needs assist             Mobility Comments: ModI with RW, family assists with stair negotiation ADLs Comments: ind, sons help to drive    OT Problem List: Decreased range of motion;Decreased strength;Decreased activity  tolerance;Impaired balance (sitting and/or standing);Decreased coordination;Decreased cognition;Decreased safety awareness;Cardiopulmonary status limiting activity;Impaired UE functional use   OT Treatment/Interventions: Self-care/ADL training;Therapeutic exercise;Energy conservation;DME and/or AE instruction;Neuromuscular education;Therapeutic activities;Patient/family education;Balance training      OT Goals(Current goals can be found in the care plan section)   Acute Rehab OT Goals Patient Stated Goal: did not state OT Goal Formulation: With patient Time For Goal Achievement: 11/19/23 Potential to Achieve Goals: Good ADL Goals Pt Will Perform Grooming: with supervision;standing Pt Will Perform Upper Body Dressing: with supervision;sitting;standing Pt Will Perform Lower Body Dressing: with supervision;sit to/from stand;sitting/lateral leans Pt Will Transfer to Toilet: with supervision;ambulating;regular height toilet Pt Will Perform Tub/Shower Transfer: Tub transfer;Shower transfer;with supervision;ambulating   OT Frequency:  Min 1X/week    Co-evaluation PT/OT/SLP Co-Evaluation/Treatment: Yes Reason for Co-Treatment: For patient/therapist safety;To address functional/ADL transfers   OT goals addressed during session: ADL's and self-care;Strengthening/ROM      AM-PAC OT 6 Clicks Daily Activity     Outcome Measure Help from another person eating meals?: A Little Help from another person taking care of personal grooming?: A Little Help from another person toileting, which includes using toliet, bedpan, or urinal?: A Lot Help from another person bathing (including washing, rinsing, drying)?: A Lot Help from another person to put on and taking off regular upper body clothing?: A Lot Help from another person to put on and taking off regular lower body  clothing?: A Lot 6 Click Score: 14   End of Session Equipment Utilized During Treatment: Gait belt;Oxygen  (4L) Nurse  Communication: Mobility status (BP)  Activity Tolerance: Patient tolerated treatment well Patient left: in bed;with call bell/phone within reach;with bed alarm set;with family/visitor present  OT Visit Diagnosis: Unsteadiness on feet (R26.81);Other abnormalities of gait and mobility (R26.89);Muscle weakness (generalized) (M62.81);History of falling (Z91.81)                Time: 0937-1000 OT Time Calculation (min): 23 min Charges:  OT General Charges $OT Visit: 1 Visit OT Evaluation $OT Eval Moderate Complexity: 1 Mod  Gurnoor Ursua K, OTD, OTR/L SecureChat [facility] [phone]   Roark Rufo K Koonce 11/05/2023, 1:19 PM

## 2023-11-05 NOTE — Progress Notes (Signed)
 [facility]                                                                               Courtney Cline, is a 89 y.o. female, DOB - [birth year], FMW:[id number] Admit date - 11/04/2023    Outpatient Primary MD for the patient is Varadarajan, Rupashree, MD  LOS - 0  days    Brief summary   Courtney Cline is a 89 y.o. female with medical history significant of breast cancer, COPD, HTN, and HLD p/w altered mental status, confusion, as well as some dysarthria and suspected stroke found to have acute metabolic encephalopathy in the setting of sepsis 2/2 acute cystitis vs stroke    Of note, she initially presented as a code stroke. CT head showed no acute process. She was evaluated by Dr. Merrianne of neurology, who felt that this presentation was more consistent with a metabolic process as opposed to a TIA or acute ischemic stroke.   MRI brain SHOWS . Acute left thalamocapsular infarct.  MRA head without contrast shows Left PCA P2 segment occlusion with intermittent distal reconstitution.   Assessment & Plan    Assessment and Plan:  Acute metabolic encephalopathy / acute left thalamo capsular infarct: Admit for stroke work up.  Neurology on board.  Get carotid duplex and echocardiogram.  Hgb!c is 5.2%, LDL is pending.  Continue with IV ceftriaxone  for possible UTI.  Continue with eliquis  and lipitor .  Slp eval recommending dysphagia 3 diet.  Therapy eval are pending.     Dysphagia: Post stroke dysphagia.  Slp on board.    PAF  Non compliant to Eliquis .  Rate controlled with metoprolol .  On Eliquis  for anti coagulation.    Hypertension Patient on hydralazine  and metoprolol .   Well controlled.    hyperlipidemia Resume lipitor .     AKI Creatinine at baseline between 1.2 to 1.5. Admitted with a creatinine of 1.9, improved to 1.6 with IV fluids.   Estimated body mass index is 28.97 kg/m as calculated from the following:   Height as of this  encounter: 5' 2 (1.575 m).   Weight as of this encounter: 71.8 kg.  Code Status: DNR limited.  DVT Prophylaxis:  apixaban  (ELIQUIS ) tablet 2.5 mg Start: 11/04/23 1000 SCDs Start: 11/04/23 0326 apixaban  (ELIQUIS ) tablet 2.5 mg   Level of Care: Level of care: Telemetry Medical Family Communication: none at bedside.   Disposition Plan:     Remains inpatient appropriate:  pending stroke work up  Procedures:  Carotid duplex.  Echocardiogram. With bubble study.   Consultants:   Neurology.   Antimicrobials:   Anti-infectives (From admission, onward)    Start     Dose/Rate Route Frequency Ordered Stop   11/04/23 1900  azithromycin  (ZITHROMAX ) 500 mg in sodium chloride  0.9 % 250 mL IVPB  Status:  Discontinued        500 mg 250 mL/hr over 60 Minutes Intravenous Every 24 hours 11/04/23 0327 11/04/23 0825   11/04/23 1900  cefTRIAXone  (ROCEPHIN ) 1 g in sodium chloride  0.9 % 100 mL IVPB        1 g 200 mL/hr over 30 Minutes Intravenous Every 24 hours 11/04/23 0327  11/04/23 0315  azithromycin  (ZITHROMAX ) 500 mg in sodium chloride  0.9 % 250 mL IVPB        500 mg 250 mL/hr over 60 Minutes Intravenous  Once 11/04/23 0314 11/04/23 0538   11/04/23 0245  cefTRIAXone  (ROCEPHIN ) 1 g in sodium chloride  0.9 % 100 mL IVPB        1 g 200 mL/hr over 30 Minutes Intravenous  Once 11/04/23 0231 11/04/23 0342        Medications  Scheduled Meds:  apixaban   2.5 mg Oral BID   [START ON 11/06/2023] atorvastatin   80 mg Oral Daily   budesonide -glycopyrrolate -formoterol   2 puff Inhalation BID   hydrALAZINE   25 mg Oral BID   metoprolol  succinate  25 mg Oral QHS   Continuous Infusions:  cefTRIAXone  (ROCEPHIN )  IV 1 g (11/04/23 1829)   PRN Meds:.acetaminophen  **OR** acetaminophen , ipratropium-albuterol , melatonin, ondansetron  (ZOFRAN ) IV, senna    Subjective:   Courtney Cline was seen and examined today.  No new complaints.   Objective:   Vitals:   11/05/23 0747 11/05/23 0806 11/05/23  1224 11/05/23 1241  BP: (!) 142/78  (!) 157/144 124/82  Pulse: (!) 49 (!) 114 (!) 138   Resp: 18  18 18   Temp: 98.1 F (36.7 C)  98.8 F (37.1 C)   TempSrc: Oral  Oral   SpO2: 100%  98%   Weight:      Height:        Intake/Output Summary (Last 24 hours) at 11/05/2023 1702 Last data filed at 11/05/2023 1300 Gross per 24 hour  Intake 240 ml  Output --  Net 240 ml   Filed Weights   11/04/23 0212  Weight: 71.8 kg     Exam General exam: Appears calm and comfortable  Respiratory system: Clear to auscultation. Respiratory effort normal. Cardiovascular system: S1 & S2 heard, RRR.  Gastrointestinal system: Abdomen is nondistended, soft and nontender.  Central nervous system: Alert and oriented. Extremities: Symmetric 5 x 5 power. Skin: No rashes,  Psychiatry:  Mood & affect appropriate.     Data Reviewed:  I have personally reviewed following labs and imaging studies   CBC Lab Results  Component Value Date   WBC 8.1 11/04/2023   RBC 3.23 (L) 11/04/2023   HGB 9.3 (L) 11/04/2023   HCT 30.9 (L) 11/04/2023   MCV 95.7 11/04/2023   MCH 28.8 11/04/2023   PLT 266 11/04/2023   MCHC 30.1 11/04/2023   RDW 13.5 11/04/2023   LYMPHSABS 0.5 (L) 11/04/2023   MONOABS 0.4 11/04/2023   EOSABS 0.1 11/04/2023   BASOSABS 0.0 11/04/2023     Last metabolic panel Lab Results  Component Value Date   NA 139 11/04/2023   K 4.0 11/04/2023   CL 103 11/04/2023   CO2 24 11/04/2023   BUN 21 11/04/2023   CREATININE 1.66 (H) 11/04/2023   GLUCOSE 134 (H) 11/04/2023   GFRNONAA 29 (L) 11/04/2023   GFRAA 41 (L) 12/24/2018   CALCIUM  8.6 (L) 11/04/2023   PHOS 2.7 01/05/2022   PROT 6.6 11/04/2023   ALBUMIN 2.9 (L) 11/04/2023   LABGLOB 3.0 07/09/2015   AGRATIO 1.1 07/09/2015   BILITOT 0.8 11/04/2023   ALKPHOS 58 11/04/2023   AST 19 11/04/2023   ALT 6 11/04/2023   ANIONGAP 12 11/04/2023    CBG (last 3)  Recent Labs    11/04/23 0139  GLUCAP 111*      Coagulation Profile: Recent  Labs  Lab 11/04/23 0147  INR 1.0  Radiology Studies: MR ANGIO HEAD WO CONTRAST Result Date: 11/05/2023 EXAM: MR Angiography Head without intravenous Contrast. 11/05/2023 01:57:00 AM TECHNIQUE: Magnetic resonance angiography images of the head without intravenous contrast. Multiplanar 2D and 3D reformatted images are provided for review. COMPARISON: None provided. CLINICAL HISTORY: Stroke/TIA, determine embolic source. FINDINGS: ANTERIOR CIRCULATION: No significant stenosis of the internal carotid arteries. No significant stenosis of the anterior cerebral arteries. No significant stenosis of the middle cerebral arteries. No aneurysm. POSTERIOR CIRCULATION: There is occlusion of the left PCA distal P2 segment with intermittent distal reconstitution. No significant stenosis of the basilar artery. No significant stenosis of the vertebral arteries. No aneurysm. IMPRESSION: 1. Left PCA P2 segment occlusion with intermittent distal reconstitution. Electronically signed by: Franky Stanford MD 11/05/2023 02:11 AM EDT RP Workstation: [id number]   MR BRAIN WO CONTRAST Result Date: 11/04/2023 CLINICAL DATA:  Altered mental status, nontraumatic (Ped 0-17y) EXAM: MRI HEAD WITHOUT CONTRAST TECHNIQUE: Multiplanar, multiecho pulse sequences of the brain and surrounding structures were obtained without intravenous contrast. COMPARISON:  CT head from earlier today. FINDINGS: Brain: Acute left thalamocapsular infarct. No mass effect. No midline shift. No acute hemorrhage. Moderate T2/FLAIR hyperintensities in the white matter which are compatible with chronic microvascular ischemic change. No mass lesion. Small amount of curvilinear susceptibility artifact along the posterior parietooccipital convexities bilaterally suggestive of superficial siderosis. Vascular: Major arterial flow voids are maintained at the skull base. Skull and upper cervical spine: Normal marrow signal. Sinuses/Orbits: Clear sinuses.  No acute orbital  findings. Other: No mastoid effusions IMPRESSION: 1. Acute left thalamocapsular infarct. 2. Moderate chronic microvascular disease. 3. Suspected small amount of superficial siderosis along the parieto-occipital convexities. Electronically Signed   By: Gilmore GORMAN Molt M.D.   On: 11/04/2023 15:43   DG Chest Portable 1 View Result Date: 11/04/2023 EXAM: 1 VIEW XRAY OF THE CHEST 11/04/2023 02:43:53 AM COMPARISON: 10/11/2023 CLINICAL HISTORY: painetc. FINDINGS: LUNGS AND PLEURA: Mild bibasilar opacities, likely atelectasis. No pleural effusion. No pneumothorax. HEART AND MEDIASTINUM: Stable mild cardiomegaly. Thoracic aortic atherosclerosis. BONES AND SOFT TISSUES: No acute osseous abnormality. IMPRESSION: 1. Mild bibasilar opacities, likely atelectasis. 2. No acute abnormalities. Electronically signed by: Pinkie Pebbles MD 11/04/2023 02:47 AM EDT RP Workstation: [id number]   CT HEAD CODE STROKE WO CONTRAST Result Date: 11/04/2023 EXAM: CT HEAD WITHOUT CONTRAST 11/04/2023 01:51:22 AM TECHNIQUE: CT of the head was performed without the administration of intravenous contrast. Automated exposure control, iterative reconstruction, and/or weight based adjustment of the mA/kV was utilized to reduce the radiation dose to as low as reasonably achievable. COMPARISON: None available. CLINICAL HISTORY: Neuro deficit, acute, stroke suspected. Stroke; Dr. Merrianne FINDINGS: BRAIN AND VENTRICLES: Chronic ischemic white matter changes. Mild volume loss. ASPECTS is 10. Old cerebellar small vessel infarcts. No acute hemorrhage. No evidence of acute infarct. No hydrocephalus. No extra-axial collection. No mass effect or midline shift. ORBITS: No acute abnormality. SINUSES: No acute abnormality. SOFT TISSUES AND SKULL: No acute soft tissue abnormality. No skull fracture. IMPRESSION: 1. No acute intracranial abnormality. ASPECTS is 10. 2. Chronic ischemic white matter changes, mild volume loss, and old cerebellar small vessel  infarcts. 3. Findings communicated to Dr. Lindzen at 2:01 am on 11/04/2023. Electronically signed by: Franky Stanford MD 11/04/2023 02:02 AM EDT RP Workstation: [id number]       Elgie Butter M.D. [facility] 11/05/2023, 5:02 PM  Available via Epic secure chat 7am-7pm After 7 pm, please refer to night coverage provider listed on amion.

## 2023-11-06 ENCOUNTER — Observation Stay (HOSPITAL_COMMUNITY)

## 2023-11-06 ENCOUNTER — Other Ambulatory Visit: Payer: Self-pay

## 2023-11-06 ENCOUNTER — Encounter (HOSPITAL_COMMUNITY): Payer: Self-pay | Admitting: Internal Medicine

## 2023-11-06 DIAGNOSIS — I480 Paroxysmal atrial fibrillation: Secondary | ICD-10-CM | POA: Diagnosis not present

## 2023-11-06 DIAGNOSIS — J449 Chronic obstructive pulmonary disease, unspecified: Secondary | ICD-10-CM | POA: Diagnosis present

## 2023-11-06 DIAGNOSIS — D638 Anemia in other chronic diseases classified elsewhere: Secondary | ICD-10-CM | POA: Diagnosis present

## 2023-11-06 DIAGNOSIS — A419 Sepsis, unspecified organism: Secondary | ICD-10-CM | POA: Diagnosis present

## 2023-11-06 DIAGNOSIS — I5032 Chronic diastolic (congestive) heart failure: Secondary | ICD-10-CM | POA: Diagnosis not present

## 2023-11-06 DIAGNOSIS — Z7951 Long term (current) use of inhaled steroids: Secondary | ICD-10-CM | POA: Diagnosis not present

## 2023-11-06 DIAGNOSIS — R040 Epistaxis: Secondary | ICD-10-CM | POA: Diagnosis not present

## 2023-11-06 DIAGNOSIS — R471 Dysarthria and anarthria: Secondary | ICD-10-CM | POA: Diagnosis present

## 2023-11-06 DIAGNOSIS — S81811A Laceration without foreign body, right lower leg, initial encounter: Secondary | ICD-10-CM | POA: Diagnosis not present

## 2023-11-06 DIAGNOSIS — I5A Non-ischemic myocardial injury (non-traumatic): Secondary | ICD-10-CM | POA: Diagnosis not present

## 2023-11-06 DIAGNOSIS — T45516A Underdosing of anticoagulants, initial encounter: Secondary | ICD-10-CM | POA: Diagnosis present

## 2023-11-06 DIAGNOSIS — N179 Acute kidney failure, unspecified: Secondary | ICD-10-CM

## 2023-11-06 DIAGNOSIS — I69398 Other sequelae of cerebral infarction: Secondary | ICD-10-CM | POA: Diagnosis not present

## 2023-11-06 DIAGNOSIS — Z87891 Personal history of nicotine dependence: Secondary | ICD-10-CM | POA: Diagnosis not present

## 2023-11-06 DIAGNOSIS — I6381 Other cerebral infarction due to occlusion or stenosis of small artery: Secondary | ICD-10-CM

## 2023-11-06 DIAGNOSIS — D631 Anemia in chronic kidney disease: Secondary | ICD-10-CM | POA: Diagnosis not present

## 2023-11-06 DIAGNOSIS — Z91148 Patient's other noncompliance with medication regimen for other reason: Secondary | ICD-10-CM | POA: Diagnosis not present

## 2023-11-06 DIAGNOSIS — I6389 Other cerebral infarction: Secondary | ICD-10-CM | POA: Diagnosis not present

## 2023-11-06 DIAGNOSIS — I63432 Cerebral infarction due to embolism of left posterior cerebral artery: Secondary | ICD-10-CM | POA: Diagnosis present

## 2023-11-06 DIAGNOSIS — J9611 Chronic respiratory failure with hypoxia: Secondary | ICD-10-CM | POA: Diagnosis not present

## 2023-11-06 DIAGNOSIS — G8311 Monoplegia of lower limb affecting right dominant side: Secondary | ICD-10-CM | POA: Diagnosis present

## 2023-11-06 DIAGNOSIS — E785 Hyperlipidemia, unspecified: Secondary | ICD-10-CM

## 2023-11-06 DIAGNOSIS — Z853 Personal history of malignant neoplasm of breast: Secondary | ICD-10-CM | POA: Diagnosis not present

## 2023-11-06 DIAGNOSIS — G9341 Metabolic encephalopathy: Secondary | ICD-10-CM | POA: Diagnosis present

## 2023-11-06 DIAGNOSIS — M8580 Other specified disorders of bone density and structure, unspecified site: Secondary | ICD-10-CM | POA: Diagnosis present

## 2023-11-06 DIAGNOSIS — Z79899 Other long term (current) drug therapy: Secondary | ICD-10-CM | POA: Diagnosis not present

## 2023-11-06 DIAGNOSIS — K5901 Slow transit constipation: Secondary | ICD-10-CM | POA: Diagnosis not present

## 2023-11-06 DIAGNOSIS — I130 Hypertensive heart and chronic kidney disease with heart failure and stage 1 through stage 4 chronic kidney disease, or unspecified chronic kidney disease: Secondary | ICD-10-CM | POA: Diagnosis not present

## 2023-11-06 DIAGNOSIS — I639 Cerebral infarction, unspecified: Secondary | ICD-10-CM | POA: Diagnosis not present

## 2023-11-06 DIAGNOSIS — Z66 Do not resuscitate: Secondary | ICD-10-CM | POA: Diagnosis present

## 2023-11-06 DIAGNOSIS — I69320 Aphasia following cerebral infarction: Secondary | ICD-10-CM | POA: Diagnosis not present

## 2023-11-06 DIAGNOSIS — I10 Essential (primary) hypertension: Secondary | ICD-10-CM | POA: Diagnosis not present

## 2023-11-06 DIAGNOSIS — R29703 NIHSS score 3: Secondary | ICD-10-CM | POA: Diagnosis present

## 2023-11-06 DIAGNOSIS — Z825 Family history of asthma and other chronic lower respiratory diseases: Secondary | ICD-10-CM | POA: Diagnosis not present

## 2023-11-06 DIAGNOSIS — I4821 Permanent atrial fibrillation: Secondary | ICD-10-CM | POA: Diagnosis not present

## 2023-11-06 DIAGNOSIS — Z91199 Patient's noncompliance with other medical treatment and regimen due to unspecified reason: Secondary | ICD-10-CM | POA: Diagnosis not present

## 2023-11-06 DIAGNOSIS — Z7901 Long term (current) use of anticoagulants: Secondary | ICD-10-CM | POA: Diagnosis not present

## 2023-11-06 DIAGNOSIS — N3000 Acute cystitis without hematuria: Secondary | ICD-10-CM | POA: Diagnosis not present

## 2023-11-06 DIAGNOSIS — E860 Dehydration: Secondary | ICD-10-CM | POA: Diagnosis not present

## 2023-11-06 DIAGNOSIS — R1310 Dysphagia, unspecified: Secondary | ICD-10-CM | POA: Diagnosis present

## 2023-11-06 LAB — BASIC METABOLIC PANEL WITH GFR
Anion gap: 11 (ref 5–15)
BUN: 19 mg/dL (ref 8–23)
CO2: 25 mmol/L (ref 22–32)
Calcium: 8.6 mg/dL — ABNORMAL LOW (ref 8.9–10.3)
Chloride: 103 mmol/L (ref 98–111)
Creatinine, Ser: 1.51 mg/dL — ABNORMAL HIGH (ref 0.44–1.00)
GFR, Estimated: 33 mL/min — ABNORMAL LOW (ref >60)
Glucose, Bld: 95 mg/dL (ref 70–99)
Potassium: 4.4 mmol/L (ref 3.5–5.1)
Sodium: 139 mmol/L (ref 135–145)

## 2023-11-06 LAB — LIPID PANEL
Cholesterol: 99 mg/dL (ref 0–200)
HDL: 46 mg/dL (ref >40)
LDL Cholesterol: 41 mg/dL (ref 0–99)
Total CHOL/HDL Ratio: 2.2 ratio
Triglycerides: 60 mg/dL (ref <150)
VLDL: 12 mg/dL (ref 0–40)

## 2023-11-06 LAB — ECHOCARDIOGRAM COMPLETE BUBBLE STUDY
AR max vel: 1.27 cm2
AV Area VTI: 1.21 cm2
AV Area mean vel: 1.23 cm2
AV Mean grad: 17 mmHg
AV Peak grad: 24.9 mmHg
Ao pk vel: 2.5 m/s
Area-P 1/2: 3.31 cm2
MV VTI: 1.08 cm2
S' Lateral: 2.7 cm

## 2023-11-06 NOTE — PMR Pre-admission (Signed)
 PMR Admission Coordinator Pre-Admission Assessment  Patient: Courtney Cline is an 89 y.o., female MRN: [id number] DOB: 1933-08-09 Height: 5' 2 (157.5 cm) Weight: 64.7 kg              Insurance Information HMO: yes    PPO:      PCP:      IPA:      80/20:      OTHER:  PRIMARY: [employer]      Policy#: [id number]      Subscriber: patient CM Name: Luke HERO      Phone#: [phone] ext [phone]     Fax#: [phone] Pre-Cert#: [id number]      Employer:  Benefits:  Phone #: n/a-online at [email or web address]     Name:  Eff. Date: 02/15/23-02/14/24     Deduct: does not have one      Out of Pocket Max: $6,750 [phone] met)      Life Max: NA  CIR: $399/day co-pay with a max co-pay of $2,793/admission      SNF: $10/day co-pay for days 1-20, $214/day co-pay for days 21-100 Outpatient: $25 co-pay/visit     Co-Pay:  Home Health: 100% coverage      Co-Pay:  DME: 80% coverage     Co-Pay: 20% co-insurance Providers: in-network SECONDARY:       Policy#:       Phone#:   Artist:       Phone#:   The Data processing manager" for patients in Inpatient Rehabilitation Facilities with attached "Privacy Act Statement-Health Care Records" was provided and verbally reviewed with: Patient and Family  Emergency Contact Information Contact Information     Name Relation Home Work [place]   [phone]      Other Contacts     Name Relation Home Work Mobile   [place] Son   [phone]   Estrellita, Lasky   [phone]      Current Medical History  Patient Admitting Diagnosis: CVA History of Present Illness: Pt is an 89 year old female with medical hx significant for: A-fib, breast CA, COPD, HTN, HLD. Pt presented to [facility] as a code stroke on 11/04/23 d/t slurred speech after she slid off couch. Pt also had right LE weakness. CT head negative for acute abnormalities. Urinalysis consistent with UTI. Started on  Rocephin .Chest x-ray showed evidence of basilar opacity-infiltrate versus atelectasis. Also started on azithromycin . MRI revealed acute left thalamocapsular infarct. MRA showed left PCA P2 segment occlusion with intermittent distal reconstitution. Therapy evaluations completed and CIR recommended d/t pt's deficits in functional mobility.  Complete NIHSS TOTAL: 0 Glasgow Coma Scale Score: 15  Patient's medical record from [facility] has been reviewed by the rehabilitation admission coordinator and physician.  Past Medical History  Past Medical History:  Diagnosis Date   Allergic rhinitis    Arthritis    Breast calcification seen on mammogram    Right breast   Breast cancer (HCC)    COPD (chronic obstructive pulmonary disease) (HCC)    GERD (gastroesophageal reflux disease)    Hyperglycemia    Hyperlipidemia    Hypertension    Low back pain    On home oxygen  therapy    all the time   Osteopenia    Peripheral edema    Shortness of breath    Subclavian artery stenosis, left (HCC)    Vitamin D  deficiency    Wears dentures    top   Wears glasses  reading    Has the patient had major surgery during 100 days prior to admission? No  Family History  family history includes Emphysema in her maternal grandfather and mother.   Current Medications   Current Facility-Administered Medications:    acetaminophen  (TYLENOL ) tablet 650 mg, 650 mg, Oral, Q6H PRN, 650 mg at 11/06/23 0617 **OR** acetaminophen  (TYLENOL ) suppository 650 mg, 650 mg, Rectal, Q6H PRN, Howerter, Justin B, DO   apixaban  (ELIQUIS ) tablet 2.5 mg, 2.5 mg, Oral, BID, Howerter, Justin B, DO, 2.5 mg at 11/06/23 0907   atorvastatin  (LIPITOR ) tablet 80 mg, 80 mg, Oral, Daily, Akula, Vijaya, MD, 80 mg at 11/06/23 0907   budesonide -glycopyrrolate -formoterol  (BREZTRI ) 160-9-4.8 MCG/ACT inhaler 2 puff, 2 puff, Inhalation, BID, Georgina Basket, MD, 2 puff at 11/06/23 9180   cefTRIAXone  (ROCEPHIN ) 1 g in sodium chloride   0.9 % 100 mL IVPB, 1 g, Intravenous, Q24H, Howerter, Justin B, DO, Stopped at 11/05/23 1921   hydrALAZINE  (APRESOLINE ) tablet 25 mg, 25 mg, Oral, BID, Georgina Basket, MD, 25 mg at 11/06/23 9092   ipratropium-albuterol  (DUONEB) 0.5-2.5 (3) MG/3ML nebulizer solution 3 mL, 3 mL, Nebulization, Q6H PRN, Georgina Basket, MD   melatonin tablet 3 mg, 3 mg, Oral, QHS PRN, Howerter, Justin B, DO   metoprolol  succinate (TOPROL -XL) 24 hr tablet 25 mg, 25 mg, Oral, QHS, Opyd, Timothy S, MD, 25 mg at 11/05/23 2300   ondansetron  (ZOFRAN ) injection 4 mg, 4 mg, Intravenous, Q6H PRN, Howerter, Justin B, DO   senna (SENOKOT) tablet 8.6 mg, 1 tablet, Oral, Daily PRN, Opyd, Timothy S, MD  Patients Current Diet:  Diet Order             DIET DYS 3 Room service appropriate? Yes with Assist; Fluid consistency: Thin  Diet effective 1000                   Precautions / Restrictions Precautions Precautions: Fall Precaution/Restrictions Comments: watch HR and low BP, 4L at home Restrictions Weight Bearing Restrictions Per Provider Order: No   Has the patient had 2 or more falls or a fall with injury in the past year?No  Prior Activity Level Limited Community (1-2x/wk): MD appointments  Prior Functional Level Prior Function Prior Level of Function : History of Falls (last six months), Needs assist Mobility Comments: ModI with RW, family assists with stair negotiation ADLs Comments: ind, sons help to drive  Self Care: Did the patient need help bathing, dressing, using the toilet or eating?  Independent  Indoor Mobility: Did the patient need assistance with walking from room to room (with or without device)? Independent  Stairs: Did the patient need assistance with internal or external stairs (with or without device)? Independent  Functional Cognition: Did the patient need help planning regular tasks such as shopping or remembering to take medications? Independent  Patient Information Are you of  Hispanic, Latino/a,or Spanish origin?: A. No, not of Hispanic, Latino/a, or Spanish origin What is your race?: A. White Do you need or want an interpreter to communicate with a doctor or health care staff?: 0. No  Patient's Response To:  Health Literacy and Transportation Is the patient able to respond to health literacy and transportation needs?: Yes Health Literacy - How often do you need to have someone help you when you read instructions, pamphlets, or other written material from your doctor or pharmacy?: Never In the past 12 months, has lack of transportation kept you from medical appointments or from getting medications?: No In the past 12 months,  has lack of transportation kept you from meetings, work, or from getting things needed for daily living?: No  Home Assistive Devices / Equipment Home Equipment: Agricultural consultant (2 wheels), Shower seat, Wheelchair - manual, BSC/3in1  Prior Device Use: Indicate devices/aids used by the patient prior to current illness, exacerbation or injury? Walker/rollator  Current Functional Level Cognition  Orientation Level: Oriented X4    Extremity Assessment (includes Sensation/Coordination)  Upper Extremity Assessment: Right hand dominant, Generalized weakness, RUE deficits/detail RUE Deficits / Details: 4/5 globally compared to LUE  Lower Extremity Assessment: Defer to PT evaluation RLE Deficits / Details: Hip flexion 3+/5, Knee ext 4+/5, Ankle DF 3/5 RLE Sensation: WNL LLE Deficits / Details: Hip flexion 4/5, Knee ext 4+/5, Ankle DF 3+/5 LLE Sensation: WNL    ADLs  Overall ADL's : Needs assistance/impaired Eating/Feeding: Set up, Sitting Grooming: Minimal assistance, Sitting Upper Body Bathing: Moderate assistance Lower Body Bathing: Moderate assistance Upper Body Dressing : Moderate assistance Lower Body Dressing: Moderate assistance Toilet Transfer: Moderate assistance, +2 for physical assistance Toileting- Clothing Manipulation and  Hygiene: Moderate assistance Functional mobility during ADLs: Moderate assistance, +2 for physical assistance, Rolling walker (2 wheels)    Mobility  Overal bed mobility: Needs Assistance Bed Mobility: Rolling, Sidelying to Sit, Sit to Supine Rolling: Min assist, Used rails Sidelying to sit: Mod assist, Used rails Sit to supine: Min assist General bed mobility comments: MinA to roll with cues to reach for bed rail, ModA to raise trunk. MinA for return to supine for slight R LE    Transfers  Overall transfer level: Needs assistance Equipment used: Rolling walker (2 wheels) Transfers: Sit to/from Stand Sit to Stand: Mod assist, +2 physical assistance, +2 safety/equipment General transfer comment: ModAx2 for boost-up and to steady, pt started feeling lightheaded with return to sitting    Ambulation / Gait / Stairs / Wheelchair Mobility  Ambulation/Gait General Gait Details: deferred 2/2 symptoms    Posture / Balance Dynamic Sitting Balance Sitting balance - Comments: MaxA for occasional posterior and right lateral losses of balance. Able to correct with cueing however unable to maintain without MinA Balance Overall balance assessment: Needs assistance, Mild deficits observed, not formally tested Sitting-balance support: No upper extremity supported, Feet supported Sitting balance-Leahy Scale: Poor Sitting balance - Comments: MaxA for occasional posterior and right lateral losses of balance. Able to correct with cueing however unable to maintain without MinA Postural control: Posterior lean, Right lateral lean Standing balance support: Bilateral upper extremity supported, During functional activity, Reliant on assistive device for balance Standing balance-Leahy Scale: Poor Standing balance comment: reliant on UE and external support    Special considerations/ Life events Oxygen  4L Bladder and Bowel Incontinence and Skin Erythema/Redness: sacrum     Previous Home Environment (from  acute therapy documentation) Living Arrangements: Spouse/significant other  Lives With: Spouse Available Help at Discharge: Family, Available 24 hours/day Type of Home: House Home Layout: One level Home Access: Stairs to enter Entrance Stairs-Rails: Right, Left Entrance Stairs-Number of Steps: 4-5 Bathroom Shower/Tub: Engineer, manufacturing systems: Standard Bathroom Accessibility: Yes How Accessible: Accessible via walker Home Care Services: No  Discharge Living Setting Plans for Discharge Living Setting: Patient's home Type of Home at Discharge: House Discharge Home Layout: One level Discharge Home Access: Stairs to enter Entrance Stairs-Rails: Right, Left Entrance Stairs-Number of Steps: 4-5 Discharge Bathroom Shower/Tub: Tub/shower unit Discharge Bathroom Toilet: Standard Discharge Bathroom Accessibility: Yes How Accessible: Accessible via walker Does the patient have any problems obtaining your medications?: No  Social/Family/Support Systems Anticipated Caregiver: Courtney Cline (son), Courtney Cline (son) Anticipated Caregiver's Contact Information: Courtney Cline: [phone] Caregiver Availability: 24/7 Discharge Plan Discussed with Primary Caregiver: Yes Is Caregiver In Agreement with Plan?: Yes Does Caregiver/Family have Issues with Lodging/Transportation while Pt is in Rehab?: No   Goals Patient/Family Goal for Rehab: Supervision: PT, Superivison-Min A: OT, Mod I-Superivsion: ST Expected length of stay: 10-15 days Pt/Family Agrees to Admission and willing to participate: Yes Program Orientation Provided & Reviewed with Pt/Caregiver Including Roles  & Responsibilities: Yes   Decrease burden of Care through IP rehab admission: NA   Possible need for SNF placement upon discharge:Not anticipated   Patient Condition: This patient's condition remains as documented in the consult dated 11/06/23, in which the Rehabilitation Physician determined and documented that the patient's condition  is appropriate for intensive rehabilitative care in an inpatient rehabilitation facility. Will admit to inpatient rehab today.  Preadmission Screen Completed By:  Tinnie SHAUNNA Yvone Delayne, CCC-SLP, updates by Reche Lowers, PT, DPT 11/06/2023 2:54 PM ______________________________________________________________________   Discussed status with Dr. Lorilee on 11/08/23 at 2:05 PM  and received approval for admission today.  Admission Coordinator:  Tinnie SHAUNNA Yvone Delayne, time 2:05 PM Courtney Cline 11/08/23

## 2023-11-06 NOTE — TOC CAGE-AID Note (Signed)
 Transition of Care [facility]) - CAGE-AID Screening   Patient Details  Name: Courtney Cline MRN: [id number] Date of Birth: [birth year]  [facility] of [facility] CM/SW Contact:    Niah Heinle E Minette Manders, LCSW Phone Number: 11/06/2023, 10:11 AM   Clinical Narrative: No SA noted.   CAGE-AID Screening:    Have You Ever Felt You Ought to Cut Down on Your Drinking or Drug Use?: No Have People Annoyed You By Critizing Your Drinking Or Drug Use?: No Have You Felt Bad Or Guilty About Your Drinking Or Drug Use?: No Have You Ever Had a Drink or Used Drugs First Thing In The Morning to Steady Your Nerves or to Get Rid of a Hangover?: No CAGE-AID Score: 0  Substance Abuse Education Offered: No

## 2023-11-06 NOTE — Plan of Care (Signed)
  Problem: Education: Goal: Knowledge of General Education information will improve Description: Including pain rating scale, medication(s)/side effects and non-pharmacologic comfort measures Outcome: Progressing   Problem: Clinical Measurements: Goal: Will remain free from infection Outcome: Progressing Goal: Respiratory complications will improve Outcome: Progressing   Problem: Coping: Goal: Level of anxiety will decrease Outcome: Progressing   Problem: Elimination: Goal: Will not experience complications related to bowel motility Outcome: Progressing Goal: Will not experience complications related to urinary retention Outcome: Progressing   Problem: Safety: Goal: Ability to remain free from injury will improve Outcome: Progressing   Problem: Nutrition: Goal: Adequate nutrition will be maintained Outcome: Not Progressing

## 2023-11-06 NOTE — Progress Notes (Signed)
 Inpatient Rehab Admissions:  Inpatient Rehab Consult received.  I met with patient and son [name] at the bedside for rehabilitation assessment and to discuss goals and expectations of an inpatient rehab admission.  Discussed average length of stay, insurance authorization requirement and discharge home after completion of CIR. Both acknowledged understanding. [name] acknowledged that family will be able to provide support for pt after discharge. Will continue to follow.   Signed: Tinnie Yvone Cohens, MS, CCC-SLP Admissions Coordinator [phone]

## 2023-11-06 NOTE — Consult Note (Signed)
 Physical Medicine and Rehabilitation Consult Reason for Consult: Impaired functional mobility and altered mental status Referring Physician: Cherlyn   HPI: Courtney Cline is a 89 y.o. female with history of breast cancer, atrial fibrillation, COPD, hypertension who presented on 11/04/2023 with altered mental status and confusion and was found to have acute metabolic encephalopathy in the setting of acute cystitis/sepsis.  She had stopped taking her Eliquis  due to recent nosebleeds.  Code stroke was called but CT of the head showed no acute process and neurology felt that presentation was most consistent with metabolic process as opposed to a stroke.  However MRI of the brain demonstrated a left thalamic capsular infarct.  MRA demonstrated left PCA P2 segment occlusion with intermittent distal reconstitution.  Patient was placed on IV ceftriaxone  for UTI and continued on Eliquis  and Lipitor .  Speech saw patient for dysphagia and recommended D3 diet.  Patient was up with therapy yesterday and was mod assist for sit to stand.  Gait was not attempted due to lightheadedness.  Patient required moderate assistance for basic ADLs.  Patient lives at home with spouse in 1 level house with 3 steps to enter.  Patient was modified independent with a rolling walker prior to admission but did have a history of falls over the last 6 months.    Home: Home Living Family/patient expects to be discharged to:: Inpatient rehab Living Arrangements: Spouse/significant other Available Help at Discharge: Family, Available 24 hours/day Type of Home: House Home Access: Stairs to enter [employer] of Steps: 3 Entrance Stairs-Rails: Can reach both Home Layout: One level Bathroom Shower/Tub: Engineer, manufacturing systems:  (?) Home Equipment: Agricultural consultant (2 wheels), Information systems manager, Wheelchair - manual, BSC/3in1  Functional History: Prior Function Prior Level of Function : History of Falls (last  six months), Needs assist Mobility Comments: ModI with RW, family assists with stair negotiation ADLs Comments: ind, sons help to drive Functional Status:  Mobility: Bed Mobility Overal bed mobility: Needs Assistance Bed Mobility: Rolling, Sidelying to Sit, Sit to Supine Rolling: Min assist, Used rails Sidelying to sit: Mod assist, Used rails Sit to supine: Min assist General bed mobility comments: MinA to roll with cues to reach for bed rail, ModA to raise trunk. MinA for return to supine for slight R LE Transfers Overall transfer level: Needs assistance Equipment used: Rolling walker (2 wheels) Transfers: Sit to/from Stand Sit to Stand: Mod assist, +2 physical assistance, +2 safety/equipment General transfer comment: ModAx2 for boost-up and to steady, pt started feeling lightheaded with return to sitting Ambulation/Gait General Gait Details: deferred 2/2 symptoms    ADL: ADL Overall ADL's : Needs assistance/impaired Eating/Feeding: Set up, Sitting Grooming: Minimal assistance, Sitting Upper Body Bathing: Moderate assistance Lower Body Bathing: Moderate assistance Upper Body Dressing : Moderate assistance Lower Body Dressing: Moderate assistance Toilet Transfer: Moderate assistance, +2 for physical assistance Toileting- Clothing Manipulation and Hygiene: Moderate assistance Functional mobility during ADLs: Moderate assistance, +2 for physical assistance, Rolling walker (2 wheels)  Cognition: Cognition Orientation Level: Oriented X4 Cognition Arousal: Alert Behavior During Therapy: WFL for tasks assessed/performed   Review of Systems  Constitutional:  Positive for malaise/fatigue.  HENT: Negative.    Eyes: Negative.   Respiratory: Negative.    Cardiovascular: Negative.   Gastrointestinal: Negative.   Genitourinary: Negative.   Musculoskeletal: Negative.   Skin: Negative.   Neurological:  Positive for focal weakness and weakness.  Psychiatric/Behavioral:  The  patient is not nervous/anxious.    Past Medical History:  Diagnosis Date   Allergic rhinitis    Arthritis    Breast calcification seen on mammogram    Right breast   Breast cancer (HCC)    COPD (chronic obstructive pulmonary disease) (HCC)    GERD (gastroesophageal reflux disease)    Hyperglycemia    Hyperlipidemia    Hypertension    Low back pain    On home oxygen  therapy    all the time   Osteopenia    Peripheral edema    Shortness of breath    Subclavian artery stenosis, left (HCC)    Vitamin D  deficiency    Wears dentures    top   Wears glasses    reading   Past Surgical History:  Procedure Laterality Date   BREAST BIOPSY     BREAST LUMPECTOMY     right 2015   BREAST LUMPECTOMY WITH RADIOACTIVE SEED LOCALIZATION Right 12/20/2013   Procedure: RIGHT BREAST LUMPECTOMY WITH RADIOACTIVE SEED LOCALIZATION;  Surgeon: Debby Shipper, MD;  Location: [facility];  Service: General;  Laterality: Right;   CAROTID DUPLEX  2014   CATARACT EXTRACTION     both eyes   Family History  Problem Relation Age of Onset   Emphysema Mother    Emphysema Maternal Grandfather    Social History:  reports that she quit smoking about 29 years ago. Her smoking use included cigarettes. She started smoking about 69 years ago. She has a 40 pack-year smoking history. She has never used smokeless tobacco. She reports that she does not drink alcohol  and does not use drugs. Allergies: No Known Allergies Medications Prior to Admission  Medication Sig Dispense Refill   albuterol  (VENTOLIN  HFA) 108 (90 Base) MCG/ACT inhaler INHALE TWO PUFFS into THE lungs EVERY 6 HOURS AS NEEDED FOR wheezing OR SHORTNESS OF BREATH 8.5 g 6   apixaban  (ELIQUIS ) 2.5 MG TABS tablet TAKE ONE TABLET TWICE DAILY 60 tablet 5   Cholecalciferol  (VITAMIN D ) 2000 UNITS CAPS Take 2,000 Units by mouth daily.      Cyanocobalamin  (VITAMIN B 12) 500 MCG TABS Take 1 tablet by mouth daily.     Fluticasone -Umeclidin-Vilant  (TRELEGY ELLIPTA ) 200-62.5-25 MCG/ACT AEPB Inhale 1 puff into the lungs daily. 60 each 11   furosemide  (LASIX ) 40 MG tablet Take 40 mg by mouth 2 (two) times daily.     hydrALAZINE  (APRESOLINE ) 25 MG tablet Take 25 mg by mouth 2 (two) times daily.     metoprolol  succinate (TOPROL -XL) 25 MG 24 hr tablet Take 25 mg by mouth at bedtime.     OXYGEN  Inhale 4 L into the lungs continuous.     rosuvastatin  (CRESTOR ) 40 MG tablet Take 40 mg by mouth daily.       Blood pressure (!) 117/54, pulse 84, temperature 98.7 F (37.1 C), temperature source Oral, resp. rate 18, height 5' 2 (1.575 m), weight 64.7 kg, SpO2 100%. Physical Exam Constitutional:      General: She is not in acute distress.    Appearance: She is ill-appearing.  HENT:     Head: Normocephalic and atraumatic.     Right Ear: External ear normal.     Left Ear: External ear normal.     Nose: Nose normal.     Mouth/Throat:     Mouth: Mucous membranes are moist.     Comments: dentures Eyes:     Conjunctiva/sclera: Conjunctivae normal.     Pupils: Pupils are equal, round, and reactive to light.  Cardiovascular:  Rate and Rhythm: Normal rate.  Pulmonary:     Effort: No respiratory distress.  Abdominal:     Palpations: Abdomen is soft.  Musculoskeletal:        General: Tenderness present. No swelling.     Cervical back: Normal range of motion.  Skin:    Findings: Bruising present.  Neurological:     Comments: Pt is alert. Oriented to person, year, month (with cues), place. Speech dysarthric. Language appears intact. Delays in processing. Right central VII and tongue deviation. MMT: RUE: 4-/5 deltoid, biceps, triceps, 4/5 wrist and hand. LUE 4 to 4+/5 prox to distal. RLE 3- HF, KE and 4/5 ADF/PF. LLE 3+ to 4-/5 prox to 4+/5 distal. Senses LT and pain in all 4's. DTR's are 2+ right and 1 to 2+ on left. Toes down. No abnl resting tone.   Psychiatric:     Comments: Pt a little flat, but generally cooperative and pleasant.       Results for orders placed or performed during the hospital encounter of 11/04/23 (from the past 24 hours)  Hemoglobin A1c     Status: None   Collection Time: 11/05/23 11:08 AM  Result Value Ref Range   Hgb A1c MFr Bld 5.2 4.8 - 5.6 %   Mean Plasma Glucose 102.54 mg/dL  Lipid panel     Status: None   Collection Time: 11/06/23  1:41 AM  Result Value Ref Range   Cholesterol 99 0 - 200 mg/dL   Triglycerides 60 <849 mg/dL   HDL 46 >59 mg/dL   Total CHOL/HDL Ratio 2.2 RATIO   VLDL 12 0 - 40 mg/dL   LDL Cholesterol 41 0 - 99 mg/dL  Basic metabolic panel with GFR     Status: Abnormal   Collection Time: 11/06/23  1:41 AM  Result Value Ref Range   Sodium 139 135 - 145 mmol/L   Potassium 4.4 3.5 - 5.1 mmol/L   Chloride 103 98 - 111 mmol/L   CO2 25 22 - 32 mmol/L   Glucose, Bld 95 70 - 99 mg/dL   BUN 19 8 - 23 mg/dL   Creatinine, Ser 8.48 (H) 0.44 - 1.00 mg/dL   Calcium  8.6 (L) 8.9 - 10.3 mg/dL   GFR, Estimated 33 (L) >60 mL/min   Anion gap 11 5 - 15   VAS US  CAROTID Result Date: 11/05/2023 Carotid Arterial Duplex Study Patient Name:  LESTER PLATAS  Date of Exam:   11/05/2023 Medical Rec #: [id number]               Accession #:    [id number] Date of Birth: 09-21-1933                Patient Gender: F Patient Age:   64 years Exam Location:  [facility] Procedure:      VAS US  CAROTID Referring Phys: MARSHA ADA --------------------------------------------------------------------------------  Indications:       CVA. Risk Factors:      Hypertension, hyperlipidemia, Diabetes. Comparison Study:  Previous study on 4.10.2014. Performing Technologist: Edilia Elden Appl  Examination Guidelines: A complete evaluation includes B-mode imaging, spectral Doppler, color Doppler, and power Doppler as needed of all accessible portions of each vessel. Bilateral testing is considered an integral part of a complete examination. Limited examinations for reoccurring indications may be performed as  noted.  Right Carotid Findings: +----------+--------+--------+--------+-------------------------+--------+           PSV cm/sEDV cm/sStenosisPlaque Description       Comments +----------+--------+--------+--------+-------------------------+--------+  CCA Prox  46                                                        +----------+--------+--------+--------+-------------------------+--------+ CCA Distal59      14              heterogenous and calcific         +----------+--------+--------+--------+-------------------------+--------+ ICA Prox  88      26              heterogenous and calcific         +----------+--------+--------+--------+-------------------------+--------+ ICA Mid   93      24                                                +----------+--------+--------+--------+-------------------------+--------+ ICA Distal76      29                                                +----------+--------+--------+--------+-------------------------+--------+ ECA       93                                                        +----------+--------+--------+--------+-------------------------+--------+ +----------+--------+-------+----------------+-------------------+           PSV cm/sEDV cmsDescribe        Arm Pressure (mmHG) +----------+--------+-------+----------------+-------------------+ [id number]             Multiphasic, WNL                    +----------+--------+-------+----------------+-------------------+ +---------+--------+--+--------+--+---------+ VertebralPSV cm/s75EDV cm/s14Antegrade +---------+--------+--+--------+--+---------+  Left Carotid Findings: +----------+--------+--------+--------+-------------------------+--------+           PSV cm/sEDV cm/sStenosisPlaque Description       Comments +----------+--------+--------+--------+-------------------------+--------+ CCA Prox  66      17                                                 +----------+--------+--------+--------+-------------------------+--------+ CCA Distal54      12              heterogenous and calcific         +----------+--------+--------+--------+-------------------------+--------+ ICA Prox  112     27              heterogenous and calcific         +----------+--------+--------+--------+-------------------------+--------+ ICA Mid   154     30      1-39%                                     +----------+--------+--------+--------+-------------------------+--------+ ICA Distal114     39      1-39%                                     +----------+--------+--------+--------+-------------------------+--------+  ECA       61      8                                                 +----------+--------+--------+--------+-------------------------+--------+ +----------+--------+--------+----------------+-------------------+           PSV cm/sEDV cm/sDescribe        Arm Pressure (mmHG) +----------+--------+--------+----------------+-------------------+ [id number]              Multiphasic, WNL                    +----------+--------+--------+----------------+-------------------+ +---------+--------+--+--------+----------+ VertebralPSV cm/s74EDV cm/sRetrograde +---------+--------+--+--------+----------+ Retrograde flow noted in the left vertebral artery, suggesting subclavian steal.  Summary: Right Carotid: Velocities in the right ICA are consistent with a 1-39% stenosis. Left Carotid: Velocities in the left ICA are consistent with a 1-39% stenosis. Vertebrals:  Right vertebral artery demonstrates antegrade flow. Left vertebral              artery demonstrates retrograde flow. Subclavians: Normal flow hemodynamics were seen in bilateral subclavian              arteries. *See table(s) above for measurements and observations.  Electronically signed by Penne Colorado MD on 11/05/2023 at 7:42:09 PM.    Final    MR ANGIO HEAD WO CONTRAST Result  Date: 11/05/2023 EXAM: MR Angiography Head without intravenous Contrast. 11/05/2023 01:57:00 AM TECHNIQUE: Magnetic resonance angiography images of the head without intravenous contrast. Multiplanar 2D and 3D reformatted images are provided for review. COMPARISON: None provided. CLINICAL HISTORY: Stroke/TIA, determine embolic source. FINDINGS: ANTERIOR CIRCULATION: No significant stenosis of the internal carotid arteries. No significant stenosis of the anterior cerebral arteries. No significant stenosis of the middle cerebral arteries. No aneurysm. POSTERIOR CIRCULATION: There is occlusion of the left PCA distal P2 segment with intermittent distal reconstitution. No significant stenosis of the basilar artery. No significant stenosis of the vertebral arteries. No aneurysm. IMPRESSION: 1. Left PCA P2 segment occlusion with intermittent distal reconstitution. Electronically signed by: Franky Stanford MD 11/05/2023 02:11 AM EDT RP Workstation: [id number]   MR BRAIN WO CONTRAST Result Date: 11/04/2023 CLINICAL DATA:  Altered mental status, nontraumatic (Ped 0-17y) EXAM: MRI HEAD WITHOUT CONTRAST TECHNIQUE: Multiplanar, multiecho pulse sequences of the brain and surrounding structures were obtained without intravenous contrast. COMPARISON:  CT head from earlier today. FINDINGS: Brain: Acute left thalamocapsular infarct. No mass effect. No midline shift. No acute hemorrhage. Moderate T2/FLAIR hyperintensities in the white matter which are compatible with chronic microvascular ischemic change. No mass lesion. Small amount of curvilinear susceptibility artifact along the posterior parietooccipital convexities bilaterally suggestive of superficial siderosis. Vascular: Major arterial flow voids are maintained at the skull base. Skull and upper cervical spine: Normal marrow signal. Sinuses/Orbits: Clear sinuses.  No acute orbital findings. Other: No mastoid effusions IMPRESSION: 1. Acute left thalamocapsular infarct. 2.  Moderate chronic microvascular disease. 3. Suspected small amount of superficial siderosis along the parieto-occipital convexities. Electronically Signed   By: Gilmore GORMAN Molt M.D.   On: 11/04/2023 15:43    Assessment/Plan: Diagnosis: 89 year old female with left thalamocapsular infarct  Does the need for close, 24 hr/day medical supervision in concert with the patient's rehab needs make it unreasonable for this patient to be served in a less intensive setting? Yes Co-Morbidities requiring supervision/potential complications:  - Urosepsis -Poststroke dysphagia -Hypertension -Acute kidney injury -Atrial fibrillation Due  to bladder management, bowel management, safety, skin/wound care, disease management, medication administration, and patient education, does the patient require 24 hr/day rehab nursing? Yes Does the patient require coordinated care of a physician, rehab nurse, therapy disciplines of PT, OT, SLP to address physical and functional deficits in the context of the above medical diagnosis(es)? Yes Addressing deficits in the following areas: balance, endurance, locomotion, strength, transferring, bowel/bladder control, bathing, dressing, feeding, grooming, toileting, cognition, speech, swallowing, and psychosocial support Can the patient actively participate in an intensive therapy program of at least 3 hrs of therapy per day at least 5 days per week? Yes The potential for patient to make measurable gains while on inpatient rehab is excellent Anticipated functional outcomes upon discharge from inpatient rehab are supervision  with PT, supervision and min assist with OT, modified independent and supervision with SLP. Estimated rehab length of stay to reach the above functional goals is: 10-15 days Anticipated discharge destination: Home Overall Rehab/Functional Prognosis: excellent  POST ACUTE RECOMMENDATIONS: This patient's condition is appropriate for continued rehabilitative care  in the following setting: CIR Patient has agreed to participate in recommended program. Yes Note that insurance prior authorization may be required for reimbursement for recommended care.  Comment: Pt has what appears to be a supportive family. She was independent with her rollator prior to this admission and wants to regain her functional mobility. Rehab Admissions Coordinator to follow up.      I have personally performed a face to face diagnostic evaluation of this patient. Additionally, I have examined the patient's medical record including any pertinent labs and radiographic images.    Thanks,  Arthea ONEIDA Gunther, MD 11/06/2023

## 2023-11-06 NOTE — TOC Initial Note (Signed)
 Transition of Care [facility]) - Initial/Assessment Note    Patient Details  Name: Courtney Cline MRN: [id number] Date of Birth: 01/11/1934  Transition [facility]) CM/SW Contact:    Andrez JULIANNA George, RN Phone Number: 11/06/2023, 11:25 AM  Clinical Narrative:                  Pt is from home with her spouse. Spouse is there all the time but not able to provide any assistance at home. Current recommendations are for CIR. Awaiting CIR eval.  IP Care management following.  Expected Discharge Plan: IP Rehab Facility Barriers to Discharge: Continued Medical Work up   Patient Goals and CMS Choice   CMS Medicare.gov Compare Post Acute Care list provided to:: Patient Choice offered to / list presented to : Patient, Adult Children      Expected Discharge Plan and Services   Discharge Planning Services: CM Consult Post Acute Care Choice: IP Rehab Living arrangements for the past 2 months: Single Family Home                                      Prior Living Arrangements/Services Living arrangements for the past 2 months: Single Family Home Lives with:: Spouse Patient language and need for interpreter reviewed:: Yes Do you feel safe going back to the place where you live?: Yes          Current home services: DME (cane/ walker/ wheelchair) Criminal Activity/Legal Involvement Pertinent to Current Situation/Hospitalization: No - Comment as needed  Activities of Daily Living   ADL Screening (condition at time of admission) Independently performs ADLs?: Yes (appropriate for developmental age)  Permission Sought/Granted                  Emotional Assessment Appearance:: Appears stated age Attitude/Demeanor/Rapport: Engaged Affect (typically observed): Accepting Orientation: : Oriented to Self, Oriented to Place, Oriented to  Time, Oriented to Situation   Psych Involvement: No (comment)  Admission diagnosis:  Acute cystitis without hematuria [N30.00] AKI  (acute kidney injury) (HCC) [N17.9] Altered mental status, unspecified altered mental status type [R41.82] Acute metabolic encephalopathy [G93.41] Patient Active Problem List   Diagnosis Date Noted   Acute metabolic encephalopathy 11/04/2023   COPD with acute exacerbation (HCC) 02/15/2023   COPD exacerbation (HCC) 02/14/2023   CKD stage 3b, GFR 30-44 ml/min (HCC) 02/14/2023   Chronic heart failure with preserved ejection fraction (HFpEF) (HCC) 02/14/2023   Obesity (BMI 30-39.9) 02/14/2023   Normocytic anemia 02/14/2023   Myocardial injury 02/14/2023   Acute on chronic respiratory failure with hypoxia (HCC) 01/02/2022   Elevated troponin 01/02/2022   Sepsis (HCC) 01/02/2022   Aortic stenosis 05/06/2021   Malnutrition of moderate degree 02/16/2021   AKI (acute kidney injury) (HCC) 02/15/2021   CAP (community acquired pneumonia) 01/21/2020   Acute cystitis without hematuria    Persistent atrial fibrillation (HCC) 08/20/2019   COPD without exacerbation (HCC) 10/15/2018   Healthcare maintenance 10/15/2018   Physical deconditioning 10/15/2018   Sinusitis 01/29/2018   Diastolic CHF (HCC) 07/17/2017   Vitamin D  deficiency    Allergic rhinitis 11/23/2015   Osteopenia with high risk of fracture 11/05/2015   Post-nasal drip 03/16/2015   Anemia of chronic disease 03/10/2015   Subclavian artery stenosis, left (HCC) 04/24/2014   Bilateral lower extremity edema 04/24/2014   Essential hypertension 04/24/2014   Hyperlipidemia 04/24/2014   Breast cancer of upper-inner quadrant of  right female breast (HCC) 11/19/2013   Chronic hypoxemic respiratory failure (HCC) 10/02/2013   PCP:  Elliot Charm, MD Pharmacy:   [facility] - [place], [name] - [address] [name] [address] Phone: [phone] Fax: [phone]  [facility] [address] [name] [address] Phone: [phone] Fax: [phone]  [id number] [place], [name] - [address] AT [facility] [address] [name] [address] Phone: [phone] Fax: [phone]     Social Drivers of Health (SDOH) Social History: SDOH Screenings   Food Insecurity: No Food Insecurity (11/05/2023)  Housing: Low Risk  (11/05/2023)  Transportation Needs: No Transportation Needs (11/05/2023)  Utilities: Not At Risk (11/05/2023)  Social Connections: Socially Isolated (11/05/2023)  Tobacco Use: Medium Risk (11/06/2023)   SDOH Interventions:     Readmission Risk Interventions    02/15/2023    1:37 PM  Readmission Risk Prevention Plan  Transportation Screening Complete  PCP or Specialist Appt within 5-7 Days Complete  Home Care Screening Complete  Medication Review (RN CM) Complete

## 2023-11-06 NOTE — Progress Notes (Signed)
 [facility]                                                                               Courtney Cline, is a 89 y.o. female, DOB - 26-Jul-1933, FMW:[id number] Admit date - 11/04/2023    Outpatient Primary MD for the patient is Varadarajan, Rupashree, MD  LOS - 0  days    Brief summary   Courtney Cline is a 89 y.o. female with medical history significant of breast cancer, COPD, HTN, and HLD p/w altered mental status, confusion, as well as some dysarthria and suspected stroke found to have acute metabolic encephalopathy in the setting of sepsis 2/2 acute cystitis vs stroke    Of note, she initially presented as a code stroke. CT head showed no acute process. She was evaluated by Dr. Merrianne of neurology, who felt that this presentation was more consistent with a metabolic process as opposed to a TIA or acute ischemic stroke.   MRI brain SHOWS . Acute left thalamocapsular infarct.  MRA head without contrast shows Left PCA P2 segment occlusion with intermittent distal reconstitution.   Assessment & Plan    Assessment and Plan:  Acute metabolic encephalopathy / acute left thalamo capsular infarct: Admit for stroke work up.  Neurology on board.  Get carotid duplex and echocardiogram.  Hgb!c is 5.2%, LDL is 41, Continue with IV ceftriaxone  for 3 days for possible UTI.  Urine cultures show insignificant growth. Blood cultures are pending, negative so far.  Continue with eliquis  and lipitor  80 mg daily.  Slp eval recommending dysphagia 3 diet.  Therapy eval recommending CIR.     Dysphagia: Post stroke dysphagia.  Slp on board.    PAF  Non compliant to Eliquis .  Rate controlled with metoprolol .  restarted Eliquis  for anti coagulation.  Pt not comfortable restarting eliquis  but understands the risk of stroke.    Hypertension Patient on hydralazine  and metoprolol .  Optimal BP parameters. .    hyperlipidemia Resume lipitor .    Anemia of chronic  disease Hemoglobin around 9. Monitor.    AKI Creatinine at baseline between 1.2 to 1.5. Admitted with a creatinine of 1.9, improved to 1.6  to 1.5 with IV fluids.   Estimated body mass index is 26.09 kg/m as calculated from the following:   Height as of this encounter: 5' 2 (1.575 m).   Weight as of this encounter: 64.7 kg.  Code Status: DNR limited.  DVT Prophylaxis:  apixaban  (ELIQUIS ) tablet 2.5 mg Start: 11/04/23 1000 SCDs Start: 11/04/23 0326 apixaban  (ELIQUIS ) tablet 2.5 mg   Level of Care: Level of care: Telemetry Medical Family Communication: none at bedside.   Disposition Plan:     Remains inpatient appropriate:  pending stroke work up  Procedures:  Carotid duplex.  Echocardiogram. With bubble study.   Consultants:   Neurology.   Antimicrobials:   Anti-infectives (From admission, onward)    Start     Dose/Rate Route Frequency Ordered Stop   11/04/23 1900  azithromycin  (ZITHROMAX ) 500 mg in sodium chloride  0.9 % 250 mL IVPB  Status:  Discontinued        500 mg 250 mL/hr over 60 Minutes Intravenous Every 24  hours 11/04/23 0327 11/04/23 0825   11/04/23 1900  cefTRIAXone  (ROCEPHIN ) 1 g in sodium chloride  0.9 % 100 mL IVPB        1 g 200 mL/hr over 30 Minutes Intravenous Every 24 hours 11/04/23 0327     11/04/23 0315  azithromycin  (ZITHROMAX ) 500 mg in sodium chloride  0.9 % 250 mL IVPB        500 mg 250 mL/hr over 60 Minutes Intravenous  Once 11/04/23 0314 11/04/23 0538   11/04/23 0245  cefTRIAXone  (ROCEPHIN ) 1 g in sodium chloride  0.9 % 100 mL IVPB        1 g 200 mL/hr over 30 Minutes Intravenous  Once 11/04/23 0231 11/04/23 0342        Medications  Scheduled Meds:  apixaban   2.5 mg Oral BID   atorvastatin   80 mg Oral Daily   budesonide -glycopyrrolate -formoterol   2 puff Inhalation BID   hydrALAZINE   25 mg Oral BID   metoprolol  succinate  25 mg Oral QHS   Continuous Infusions:  cefTRIAXone  (ROCEPHIN )  IV Stopped (11/05/23 1921)   PRN  Meds:.acetaminophen  **OR** acetaminophen , ipratropium-albuterol , melatonin, ondansetron  (ZOFRAN ) IV, senna    Subjective:   Courtney Cline was seen and examined today.  Intermittent headache.  Objective:   Vitals:   11/06/23 0500 11/06/23 0819 11/06/23 0851 11/06/23 1115  BP: 110/79  (!) 117/54 (!) 117/42  Pulse: 67  84 83  Resp:    20  Temp:   98.7 F (37.1 C) 98 F (36.7 C)  TempSrc:   Oral Oral  SpO2: 100% 100%  99%  Weight: 64.7 kg     Height:        Intake/Output Summary (Last 24 hours) at 11/06/2023 1432 Last data filed at 11/06/2023 1000 Gross per 24 hour  Intake 201.89 ml  Output --  Net 201.89 ml   Filed Weights   11/04/23 0212 11/06/23 0500  Weight: 71.8 kg 64.7 kg     Exam General exam: Appears calm and comfortable  Respiratory system: Clear to auscultation. Respiratory effort normal. Cardiovascular system: S1 & S2 heard, irregularly irregular.  Gastrointestinal system: Abdomen is nondistended, soft and nontender. Central nervous system: Alert and oriented.  Extremities: Symmetric 5 x 5 power. Skin: No rashes,  Psychiatry: Mood & affect appropriate.      Data Reviewed:  I have personally reviewed following labs and imaging studies   CBC Lab Results  Component Value Date   WBC 8.1 11/04/2023   RBC 3.23 (L) 11/04/2023   HGB 9.3 (L) 11/04/2023   HCT 30.9 (L) 11/04/2023   MCV 95.7 11/04/2023   MCH 28.8 11/04/2023   PLT 266 11/04/2023   MCHC 30.1 11/04/2023   RDW 13.5 11/04/2023   LYMPHSABS 0.5 (L) 11/04/2023   MONOABS 0.4 11/04/2023   EOSABS 0.1 11/04/2023   BASOSABS 0.0 11/04/2023     Last metabolic panel Lab Results  Component Value Date   NA 139 11/06/2023   K 4.4 11/06/2023   CL 103 11/06/2023   CO2 25 11/06/2023   BUN 19 11/06/2023   CREATININE 1.51 (H) 11/06/2023   GLUCOSE 95 11/06/2023   GFRNONAA 33 (L) 11/06/2023   GFRAA 41 (L) 12/24/2018   CALCIUM  8.6 (L) 11/06/2023   PHOS 2.7 01/05/2022   PROT 6.6 11/04/2023    ALBUMIN 2.9 (L) 11/04/2023   LABGLOB 3.0 07/09/2015   AGRATIO 1.1 07/09/2015   BILITOT 0.8 11/04/2023   ALKPHOS 58 11/04/2023   AST 19 11/04/2023   ALT 6 11/04/2023  ANIONGAP 11 11/06/2023    CBG (last 3)  Recent Labs    11/04/23 0139  GLUCAP 111*      Coagulation Profile: Recent Labs  Lab 11/04/23 0147  INR 1.0     Radiology Studies: VAS US  CAROTID Result Date: 11/05/2023 Carotid Arterial Duplex Study Patient Name:  ANEESAH HERNAN  Date of Exam:   11/05/2023 Medical Rec #: [id number]               Accession #:    [id number] Date of Birth: [birth year]                Patient Gender: F Patient Age:   81 years Exam Location:  [facility] Procedure:      VAS US  CAROTID Referring Phys: MARSHA ADA --------------------------------------------------------------------------------  Indications:       CVA. Risk Factors:      Hypertension, hyperlipidemia, Diabetes. Comparison Study:  Previous study on 4.10.2014. Performing Technologist: Edilia Elden Appl  Examination Guidelines: A complete evaluation includes B-mode imaging, spectral Doppler, color Doppler, and power Doppler as needed of all accessible portions of each vessel. Bilateral testing is considered an integral part of a complete examination. Limited examinations for reoccurring indications may be performed as noted.  Right Carotid Findings: +----------+--------+--------+--------+-------------------------+--------+           PSV cm/sEDV cm/sStenosisPlaque Description       Comments +----------+--------+--------+--------+-------------------------+--------+ CCA Prox  46                                                        +----------+--------+--------+--------+-------------------------+--------+ CCA Distal59      14              heterogenous and calcific         +----------+--------+--------+--------+-------------------------+--------+ ICA Prox  88      26              heterogenous and calcific          +----------+--------+--------+--------+-------------------------+--------+ ICA Mid   93      24                                                +----------+--------+--------+--------+-------------------------+--------+ ICA Distal76      29                                                +----------+--------+--------+--------+-------------------------+--------+ ECA       93                                                        +----------+--------+--------+--------+-------------------------+--------+ +----------+--------+-------+----------------+-------------------+           PSV cm/sEDV cmsDescribe        Arm Pressure (mmHG) +----------+--------+-------+----------------+-------------------+ [id number]             Multiphasic, WNL                    +----------+--------+-------+----------------+-------------------+ +---------+--------+--+--------+--+---------+  VertebralPSV cm/s75EDV cm/s14Antegrade +---------+--------+--+--------+--+---------+  Left Carotid Findings: +----------+--------+--------+--------+-------------------------+--------+           PSV cm/sEDV cm/sStenosisPlaque Description       Comments +----------+--------+--------+--------+-------------------------+--------+ CCA Prox  66      17                                                +----------+--------+--------+--------+-------------------------+--------+ CCA Distal54      12              heterogenous and calcific         +----------+--------+--------+--------+-------------------------+--------+ ICA Prox  112     27              heterogenous and calcific         +----------+--------+--------+--------+-------------------------+--------+ ICA Mid   154     30      1-39%                                     +----------+--------+--------+--------+-------------------------+--------+ ICA Distal114     39      1-39%                                      +----------+--------+--------+--------+-------------------------+--------+ ECA       61      8                                                 +----------+--------+--------+--------+-------------------------+--------+ +----------+--------+--------+----------------+-------------------+           PSV cm/sEDV cm/sDescribe        Arm Pressure (mmHG) +----------+--------+--------+----------------+-------------------+ [id number]              Multiphasic, WNL                    +----------+--------+--------+----------------+-------------------+ +---------+--------+--+--------+----------+ VertebralPSV cm/s74EDV cm/sRetrograde +---------+--------+--+--------+----------+ Retrograde flow noted in the left vertebral artery, suggesting subclavian steal.  Summary: Right Carotid: Velocities in the right ICA are consistent with a 1-39% stenosis. Left Carotid: Velocities in the left ICA are consistent with a 1-39% stenosis. Vertebrals:  Right vertebral artery demonstrates antegrade flow. Left vertebral              artery demonstrates retrograde flow. Subclavians: Normal flow hemodynamics were seen in bilateral subclavian              arteries. *See table(s) above for measurements and observations.  Electronically signed by Penne Colorado MD on 11/05/2023 at 7:42:09 PM.    Final    MR ANGIO HEAD WO CONTRAST Result Date: 11/05/2023 EXAM: MR Angiography Head without intravenous Contrast. 11/05/2023 01:57:00 AM TECHNIQUE: Magnetic resonance angiography images of the head without intravenous contrast. Multiplanar 2D and 3D reformatted images are provided for review. COMPARISON: None provided. CLINICAL HISTORY: Stroke/TIA, determine embolic source. FINDINGS: ANTERIOR CIRCULATION: No significant stenosis of the internal carotid arteries. No significant stenosis of the anterior cerebral arteries. No significant stenosis of the middle cerebral arteries. No aneurysm. POSTERIOR CIRCULATION: There is occlusion of the  left PCA distal P2 segment with intermittent distal reconstitution. No significant  stenosis of the basilar artery. No significant stenosis of the vertebral arteries. No aneurysm. IMPRESSION: 1. Left PCA P2 segment occlusion with intermittent distal reconstitution. Electronically signed by: Franky Stanford MD 11/05/2023 02:11 AM EDT RP Workstation: [id number]   MR BRAIN WO CONTRAST Result Date: 11/04/2023 CLINICAL DATA:  Altered mental status, nontraumatic (Ped 0-17y) EXAM: MRI HEAD WITHOUT CONTRAST TECHNIQUE: Multiplanar, multiecho pulse sequences of the brain and surrounding structures were obtained without intravenous contrast. COMPARISON:  CT head from earlier today. FINDINGS: Brain: Acute left thalamocapsular infarct. No mass effect. No midline shift. No acute hemorrhage. Moderate T2/FLAIR hyperintensities in the white matter which are compatible with chronic microvascular ischemic change. No mass lesion. Small amount of curvilinear susceptibility artifact along the posterior parietooccipital convexities bilaterally suggestive of superficial siderosis. Vascular: Major arterial flow voids are maintained at the skull base. Skull and upper cervical spine: Normal marrow signal. Sinuses/Orbits: Clear sinuses.  No acute orbital findings. Other: No mastoid effusions IMPRESSION: 1. Acute left thalamocapsular infarct. 2. Moderate chronic microvascular disease. 3. Suspected small amount of superficial siderosis along the parieto-occipital convexities. Electronically Signed   By: Gilmore GORMAN Molt M.D.   On: 11/04/2023 15:43       Elgie Butter M.D. [facility] 11/06/2023, 2:32 PM  Available via Epic secure chat 7am-7pm After 7 pm, please refer to night coverage provider listed on amion.

## 2023-11-06 NOTE — Plan of Care (Signed)

## 2023-11-06 NOTE — Progress Notes (Signed)
  Echocardiogram 2D Echocardiogram has been performed.  Courtney Cline 11/06/2023, 10:10 AM

## 2023-11-07 DIAGNOSIS — N3000 Acute cystitis without hematuria: Secondary | ICD-10-CM

## 2023-11-07 NOTE — Progress Notes (Signed)
 [facility]                                                                               Courtney Cline, is a 89 y.o. female, DOB - September 08, 1933, FMW:[id number] Admit date - 11/04/2023    Outpatient Primary MD for the patient is Varadarajan, Rupashree, MD  LOS - 1  days    Brief summary   Courtney Cline is a 89 y.o. female with medical history significant of breast cancer, COPD, HTN, and HLD p/w altered mental status, confusion, as well as some dysarthria and suspected stroke found to have acute metabolic encephalopathy in the setting of sepsis 2/2 acute cystitis vs stroke    Of note, she initially presented as a code stroke. CT head showed no acute process. She was evaluated by Dr. Merrianne of neurology, who felt that this presentation was more consistent with a metabolic process as opposed to a TIA or acute ischemic stroke.   MRI brain shows Acute left thalamocapsular infarct.  MRA head without contrast shows Left PCA P2 segment occlusion with intermittent distal reconstitution.  Neurology consulted , recommendation given.  Plan for CIR when bed available.   Assessment & Plan    Assessment and Plan:  Acute metabolic encephalopathy / acute left thalamo capsular infarct: Admit for stroke work up.  Neurology on board.  Reviewed Echocardiogram and carotid duplex Hgb!c is 5.2%, LDL is 41, Continue with eliquis  and lipitor  80 mg daily.  Slp eval recommending dysphagia 3 diet.  Therapy eval recommending CIR.     Dysphagia: Post stroke dysphagia.  Slp on board.    PAF  Non compliant to Eliquis .  Rate controlled with metoprolol .  restarted Eliquis  for anti coagulation.  Pt not comfortable restarting eliquis  but understands the risk of stroke and agreeable.    Hypertension Patient on hydralazine  and metoprolol .  Well controlled BP parameters.    hyperlipidemia Resume lipitor .    Anemia of chronic disease Hemoglobin around 9. Monitor.     AKI Creatinine at baseline between 1.2 to 1.5. Admitted with a creatinine of 1.9, improved to 1.6  to 1.5 with IV fluids.    Possible UTI; Completed 3 days of IV Ceftriaxone .   Estimated body mass index is 26.09 kg/m as calculated from the following:   Height as of this encounter: 5' 2 (1.575 m).   Weight as of this encounter: 64.7 kg.  Code Status: DNR limited.  DVT Prophylaxis:  apixaban  (ELIQUIS ) tablet 2.5 mg Start: 11/04/23 1000 SCDs Start: 11/04/23 0326 apixaban  (ELIQUIS ) tablet 2.5 mg   Level of Care: Level of care: Telemetry Medical Family Communication: none at bedside.   Disposition Plan:     Remains inpatient appropriate:  waiting for CIR.   Procedures:  Carotid duplex.  Echocardiogram. With bubble study.   Consultants:   Neurology.   Antimicrobials:   Anti-infectives (From admission, onward)    Start     Dose/Rate Route Frequency Ordered Stop   11/04/23 1900  azithromycin  (ZITHROMAX ) 500 mg in sodium chloride  0.9 % 250 mL IVPB  Status:  Discontinued        500 mg 250 mL/hr over 60 Minutes Intravenous Every 24  hours 11/04/23 0327 11/04/23 0825   11/04/23 1900  cefTRIAXone  (ROCEPHIN ) 1 g in sodium chloride  0.9 % 100 mL IVPB  Status:  Discontinued        1 g 200 mL/hr over 30 Minutes Intravenous Every 24 hours 11/04/23 0327 11/07/23 0832   11/04/23 0315  azithromycin  (ZITHROMAX ) 500 mg in sodium chloride  0.9 % 250 mL IVPB        500 mg 250 mL/hr over 60 Minutes Intravenous  Once 11/04/23 0314 11/04/23 0538   11/04/23 0245  cefTRIAXone  (ROCEPHIN ) 1 g in sodium chloride  0.9 % 100 mL IVPB        1 g 200 mL/hr over 30 Minutes Intravenous  Once 11/04/23 0231 11/04/23 0342        Medications  Scheduled Meds:  apixaban   2.5 mg Oral BID   atorvastatin   80 mg Oral Daily   budesonide -glycopyrrolate -formoterol   2 puff Inhalation BID   hydrALAZINE   25 mg Oral BID   metoprolol  succinate  25 mg Oral QHS   Continuous Infusions:   PRN  Meds:.acetaminophen  **OR** acetaminophen , ipratropium-albuterol , melatonin, ondansetron  (ZOFRAN ) IV, senna    Subjective:   Courtney Cline was seen and examined today.  No new complaints.  Objective:   Vitals:   11/07/23 0434 11/07/23 0828 11/07/23 0849 11/07/23 1610  BP: (!) 146/51 136/72  137/67  Pulse: 78 79 81 78  Resp: 16  17 (!) 24  Temp: 98.1 F (36.7 C) 98 F (36.7 C)  98.2 F (36.8 C)  TempSrc: Oral Oral  Oral  SpO2: 100% 100% 98% 100%  Weight:      Height:        Intake/Output Summary (Last 24 hours) at 11/07/2023 1806 Last data filed at 11/07/2023 1800 Gross per 24 hour  Intake 600 ml  Output --  Net 600 ml   Filed Weights   11/04/23 0212 11/06/23 0500  Weight: 71.8 kg 64.7 kg     Exam General exam: Appears calm and comfortable  Respiratory system: Clear to auscultation. Respiratory effort normal. Cardiovascular system: S1 & S2 heard, RRR. No JVD, Gastrointestinal system: Abdomen is nondistended, soft and nontender.  Central nervous system: Alert and oriented.  Extremities: no edema.  Skin: No rashes, Psychiatry: Mood & affect appropriate.       Data Reviewed:  I have personally reviewed following labs and imaging studies   CBC Lab Results  Component Value Date   WBC 8.1 11/04/2023   RBC 3.23 (L) 11/04/2023   HGB 9.3 (L) 11/04/2023   HCT 30.9 (L) 11/04/2023   MCV 95.7 11/04/2023   MCH 28.8 11/04/2023   PLT 266 11/04/2023   MCHC 30.1 11/04/2023   RDW 13.5 11/04/2023   LYMPHSABS 0.5 (L) 11/04/2023   MONOABS 0.4 11/04/2023   EOSABS 0.1 11/04/2023   BASOSABS 0.0 11/04/2023     Last metabolic panel Lab Results  Component Value Date   NA 139 11/06/2023   K 4.4 11/06/2023   CL 103 11/06/2023   CO2 25 11/06/2023   BUN 19 11/06/2023   CREATININE 1.51 (H) 11/06/2023   GLUCOSE 95 11/06/2023   GFRNONAA 33 (L) 11/06/2023   GFRAA 41 (L) 12/24/2018   CALCIUM  8.6 (L) 11/06/2023   PHOS 2.7 01/05/2022   PROT 6.6 11/04/2023   ALBUMIN 2.9  (L) 11/04/2023   LABGLOB 3.0 07/09/2015   AGRATIO 1.1 07/09/2015   BILITOT 0.8 11/04/2023   ALKPHOS 58 11/04/2023   AST 19 11/04/2023   ALT 6 11/04/2023  ANIONGAP 11 11/06/2023    CBG (last 3)  No results for input(s): GLUCAP in the last 72 hours.     Coagulation Profile: Recent Labs  Lab 11/04/23 0147  INR 1.0     Radiology Studies: ECHOCARDIOGRAM COMPLETE BUBBLE STUDY Result Date: 11/06/2023    ECHOCARDIOGRAM REPORT   Patient Name:   Courtney Cline Date of Exam: 11/06/2023 Medical Rec #:  [id number]              Height:       62.0 in Accession #:    [id number]             Weight:       142.6 lb Date of Birth:  [birth year]               BSA:          1.656 m Patient Age:    89 years               BP:           110/79 mmHg Patient Gender: F                      HR:           89 bpm. Exam Location:  Inpatient Procedure: 2D Echo, Cardiac Doppler, Color Doppler and Saline Contrast Bubble            Study (Both Spectral and Color Flow Doppler were utilized during            procedure). Indications:    Stroke I63.9  History:        Patient has prior history of Echocardiogram examinations, most                 recent 02/16/2021. CHF, Previous Myocardial Infarction, COPD and                 CKD, stage 3, Arrythmias:Atrial Fibrillation; Risk                 Factors:Hypertension and Dyslipidemia.  Sonographer:    Thea Norlander RCS Referring Phys: MARSHA ADA IMPRESSIONS  1. Left ventricular ejection fraction, by estimation, is 60 to 65%. The left ventricle has normal function. The left ventricle has no regional wall motion abnormalities. Left ventricular diastolic parameters are consistent with Grade II diastolic dysfunction (pseudonormalization).  2. Right ventricular systolic function is normal. The right ventricular size is normal. There is severely elevated pulmonary artery systolic pressure. The estimated right ventricular systolic pressure is 67.0 mmHg.  3. Left atrial size was  moderately dilated.  4. Negative bubble study, no evidence for PFO or ASD.  5. MVA 1.08 cm^2 by VTI. The mitral valve is degenerative. Trivial mitral valve regurgitation. Severe mitral stenosis. The mean mitral valve gradient is 9.0 mmHg. Moderate to severe mitral annular calcification.  6. Tricuspid valve regurgitation is moderate.  7. The aortic valve is tricuspid. Aortic valve regurgitation is trivial. Moderate aortic valve stenosis. Aortic valve area, by VTI measures 1.21 cm. Aortic valve mean gradient measures 17.0 mmHg.  8. The inferior vena cava is normal in size with greater than 50% respiratory variability, suggesting right atrial pressure of 3 mmHg. FINDINGS  Left Ventricle: Left ventricular ejection fraction, by estimation, is 60 to 65%. The left ventricle has normal function. The left ventricle has no regional wall motion abnormalities. The left ventricular internal cavity size was normal in size. There is  no left ventricular  hypertrophy. Left ventricular diastolic parameters are consistent with Grade II diastolic dysfunction (pseudonormalization). Right Ventricle: The right ventricular size is normal. No increase in right ventricular wall thickness. Right ventricular systolic function is normal. There is severely elevated pulmonary artery systolic pressure. The tricuspid regurgitant velocity is 4.00 m/s, and with an assumed right atrial pressure of 3 mmHg, the estimated right ventricular systolic pressure is 67.0 mmHg. Left Atrium: Left atrial size was moderately dilated. Right Atrium: Right atrial size was normal in size. Pericardium: There is no evidence of pericardial effusion. Mitral Valve: MVA 1.08 cm^2 by VTI. The mitral valve is degenerative in appearance. There is severe calcification of the mitral valve leaflet(s). Moderate to severe mitral annular calcification. Trivial mitral valve regurgitation. Severe mitral valve stenosis. MV peak gradient, 16.5 mmHg. The mean mitral valve gradient is  9.0 mmHg. Tricuspid Valve: The tricuspid valve is normal in structure. Tricuspid valve regurgitation is moderate. Aortic Valve: The aortic valve is tricuspid. Aortic valve regurgitation is trivial. Moderate aortic stenosis is present. Aortic valve mean gradient measures 17.0 mmHg. Aortic valve peak gradient measures 24.9 mmHg. Aortic valve area, by VTI measures 1.21  cm. Pulmonic Valve: The pulmonic valve was normal in structure. Pulmonic valve regurgitation is not visualized. Aorta: The aortic root is normal in size and structure. Venous: The inferior vena cava is normal in size with greater than 50% respiratory variability, suggesting right atrial pressure of 3 mmHg. IAS/Shunts: Negative bubble study, no evidence for PFO or ASD. Agitated saline contrast was given intravenously to evaluate for intracardiac shunting.  LEFT VENTRICLE PLAX 2D LVIDd:         4.30 cm   Diastology LVIDs:         2.70 cm   LV e' medial:    11.20 cm/s LV PW:         0.70 cm   LV E/e' medial:  16.9 LV IVS:        0.80 cm   LV e' lateral:   10.30 cm/s LVOT diam:     1.90 cm   LV E/e' lateral: 18.3 LV SV:         54 LV SV Index:   33 LVOT Area:     2.84 cm  RIGHT VENTRICLE             IVC RV S prime:     20.50 cm/s  IVC diam: 1.30 cm TAPSE (M-mode): 1.8 cm LEFT ATRIUM             Index        RIGHT ATRIUM           Index LA diam:        3.90 cm 2.36 cm/m   RA Area:     15.70 cm LA Vol (A2C):   57.2 ml 34.54 ml/m  RA Volume:   37.45 ml  22.62 ml/m LA Vol (A4C):   62.0 ml 37.44 ml/m LA Biplane Vol: 60.3 ml 36.42 ml/m  AORTIC VALVE AV Area (Vmax):    1.27 cm AV Area (Vmean):   1.23 cm AV Area (VTI):     1.21 cm AV Vmax:           249.67 cm/s AV Vmean:          176.000 cm/s AV VTI:            0.451 m AV Peak Grad:      24.9 mmHg AV Mean Grad:      17.0 mmHg  LVOT Vmax:         112.00 cm/s LVOT Vmean:        76.100 cm/s LVOT VTI:          0.192 m LVOT/AV VTI ratio: 0.43  AORTA Ao Root diam: 3.30 cm Ao Asc diam:  3.40 cm MITRAL VALVE                 TRICUSPID VALVE MV Area (PHT): 3.31 cm     TR Peak grad:   64.0 mmHg MV Area VTI:   1.08 cm     TR Vmax:        400.00 cm/s MV Peak grad:  16.5 mmHg MV Mean grad:  9.0 mmHg     SHUNTS MV Vmax:       2.03 m/s     Systemic VTI:  0.19 m MV Vmean:      137.5 cm/s   Systemic Diam: 1.90 cm MV Decel Time: 229 msec MV E velocity: 189.00 cm/s MV A velocity: 135.00 cm/s MV E/A ratio:  1.40 Dalton McleanMD Electronically signed by Ezra Kanner Signature Date/Time: 11/06/2023/6:21:01 PM    Final        Elgie Butter M.D. [facility] 11/07/2023, 6:06 PM  Available via Epic secure chat 7am-7pm After 7 pm, please refer to night coverage provider listed on amion.

## 2023-11-07 NOTE — Progress Notes (Addendum)
 Inpatient Rehab Admissions Coordinator:  Insurance authorization started. Will continue to follow.  Pt seen at bedside and updated.   Tinnie Yvone Cohens, MS, CCC-SLP Admissions Coordinator [phone]

## 2023-11-07 NOTE — Progress Notes (Signed)
 Physical Therapy Treatment Patient Details Name: Courtney Cline MRN: [id number] DOB: 03-23-1933 Today's Date: 11/07/2023   History of Present Illness 89 y/o F presenting to ED on 9/20 as code stroke with slurred speech, RLE weakness, and fell off her cough. Found to have acute metabolic encephalopathy in the setting of sepsis 2/2 acute cystitis  Left PCA P2 segment occlusion with intermittent distal reconstitution.  PMH includes A fib, breast CA, COPD, HTN, HLD,    PT Comments  Pt received in supine and agreeable to session. Pt requires less assist for bed mobility and transfers this session, but continues to be limited by impaired balance and activity tolerance. Pt able to take a few steps to the recliner and then perform static marches, but requires increased assist for balance with fatigue due to weakness and posterior bias. Pt demonstrates increased difficulty elevating RLE due to increased instability with L single leg stance. Pt demonstrates improved posterior lean with visual cue. Pt continues to benefit from PT services to progress toward functional mobility goals.     If plan is discharge home, recommend the following: A lot of help with walking and/or transfers;A lot of help with bathing/dressing/bathroom;Assist for transportation;Help with stairs or ramp for entrance   Can travel by private vehicle     No  Equipment Recommendations  None recommended by PT    Recommendations for Other Services       Precautions / Restrictions Precautions Precautions: Fall Recall of Precautions/Restrictions: Impaired Precaution/Restrictions Comments: watch HR and low BP, 4L at home Restrictions Weight Bearing Restrictions Per Provider Order: No     Mobility  Bed Mobility Overal bed mobility: Needs Assistance Bed Mobility: Supine to Sit     Supine to sit: Contact guard, HOB elevated, Used rails     General bed mobility comments: increased time and cues for technique, but no  physical assist needed    Transfers Overall transfer level: Needs assistance Equipment used: Rolling walker (2 wheels) Transfers: Sit to/from Stand, Bed to chair/wheelchair/BSC Sit to Stand: Min assist, +2 physical assistance, +2 safety/equipment   Step pivot transfers: Min assist, +2 safety/equipment, +2 physical assistance       General transfer comment: From EOB with min A +2 for power up and anterior weight shift due to posterior bias. Pt able to step to recliner with cues for sequencing and assist for balance and RW management    Ambulation/Gait             Pre-gait activities: static standing marches     Stairs             Wheelchair Mobility     Tilt Bed    Modified Rankin (Stroke Patients Only) Modified Rankin (Stroke Patients Only) Pre-Morbid Rankin Score: No symptoms Modified Rankin: Moderately severe disability     Balance Overall balance assessment: Needs assistance Sitting-balance support: Bilateral upper extremity supported, Feet supported Sitting balance-Leahy Scale: Fair Sitting balance - Comments: CGA sitting EOB   Standing balance support: Bilateral upper extremity supported, During functional activity, Reliant on assistive device for balance Standing balance-Leahy Scale: Poor Standing balance comment: reliant on UE and external support due to posterior bias                            Communication Communication Communication: No apparent difficulties  Cognition Arousal: Alert Behavior During Therapy: WFL for tasks assessed/performed   PT - Cognitive impairments: No apparent impairments  Following commands: Intact      Cueing Cueing Techniques: Verbal cues  Exercises General Exercises - Lower Extremity Long Arc Quad: AROM, Seated, Both, 10 reps Hip Flexion/Marching: AROM, Seated, Both, 5 reps    General Comments        Pertinent Vitals/Pain Pain Assessment Pain Assessment:  Faces Faces Pain Scale: No hurt     PT Goals (current goals can now be found in the care plan section) Acute Rehab PT Goals Patient Stated Goal: to gain more independence with mobility PT Goal Formulation: With patient/family Time For Goal Achievement: 11/19/23 Progress towards PT goals: Progressing toward goals    Frequency    Min 2X/week       AM-PAC PT 6 Clicks Mobility   Outcome Measure  Help needed turning from your back to your side while in a flat bed without using bedrails?: A Little Help needed moving from lying on your back to sitting on the side of a flat bed without using bedrails?: A Little Help needed moving to and from a bed to a chair (including a wheelchair)?: A Lot Help needed standing up from a chair using your arms (e.g., wheelchair or bedside chair)?: A Lot Help needed to walk in hospital room?: Total Help needed climbing 3-5 steps with a railing? : Total 6 Click Score: 12    End of Session Equipment Utilized During Treatment: Gait belt Activity Tolerance: Patient tolerated treatment well Patient left: in chair;with chair alarm set;with family/visitor present;with call bell/phone within reach Nurse Communication: Mobility status PT Visit Diagnosis: Unsteadiness on feet (R26.81);Other abnormalities of gait and mobility (R26.89);Muscle weakness (generalized) (M62.81);History of falling (Z91.81)     Time: 8886-8868 PT Time Calculation (min) (ACUTE ONLY): 18 min  Charges:    $Therapeutic Activity: 8-22 mins PT General Charges $$ ACUTE PT VISIT: 1 Visit                     Darryle George, PTA Acute Rehabilitation Services Secure Chat Preferred  [phone]    Darryle George 11/07/2023, 12:31 PM

## 2023-11-08 ENCOUNTER — Encounter (HOSPITAL_COMMUNITY): Payer: Self-pay | Admitting: Physical Medicine and Rehabilitation

## 2023-11-08 ENCOUNTER — Other Ambulatory Visit: Payer: Self-pay

## 2023-11-08 ENCOUNTER — Inpatient Hospital Stay (HOSPITAL_COMMUNITY)
Admission: AD | Admit: 2023-11-08 | Discharge: 2023-11-23 | DRG: 057 | Disposition: A | Discharge status: Home Health | Source: Intra-hospital | Attending: Physical Medicine and Rehabilitation | Admitting: Physical Medicine and Rehabilitation

## 2023-11-08 ENCOUNTER — Inpatient Hospital Stay

## 2023-11-08 DIAGNOSIS — S81811A Laceration without foreign body, right lower leg, initial encounter: Secondary | ICD-10-CM | POA: Diagnosis not present

## 2023-11-08 DIAGNOSIS — E860 Dehydration: Secondary | ICD-10-CM | POA: Diagnosis not present

## 2023-11-08 DIAGNOSIS — Z87891 Personal history of nicotine dependence: Secondary | ICD-10-CM

## 2023-11-08 DIAGNOSIS — I69320 Aphasia following cerebral infarction: Secondary | ICD-10-CM | POA: Diagnosis not present

## 2023-11-08 DIAGNOSIS — J8410 Pulmonary fibrosis, unspecified: Secondary | ICD-10-CM | POA: Diagnosis not present

## 2023-11-08 DIAGNOSIS — I10 Essential (primary) hypertension: Secondary | ICD-10-CM | POA: Diagnosis present

## 2023-11-08 DIAGNOSIS — D631 Anemia in chronic kidney disease: Secondary | ICD-10-CM | POA: Diagnosis not present

## 2023-11-08 DIAGNOSIS — I130 Hypertensive heart and chronic kidney disease with heart failure and stage 1 through stage 4 chronic kidney disease, or unspecified chronic kidney disease: Secondary | ICD-10-CM | POA: Diagnosis present

## 2023-11-08 DIAGNOSIS — I4819 Other persistent atrial fibrillation: Secondary | ICD-10-CM

## 2023-11-08 DIAGNOSIS — R000 Tachycardia, unspecified: Secondary | ICD-10-CM | POA: Diagnosis not present

## 2023-11-08 DIAGNOSIS — Z87440 Personal history of urinary (tract) infections: Secondary | ICD-10-CM

## 2023-11-08 DIAGNOSIS — I639 Cerebral infarction, unspecified: Secondary | ICD-10-CM | POA: Diagnosis not present

## 2023-11-08 DIAGNOSIS — I5A Non-ischemic myocardial injury (non-traumatic): Secondary | ICD-10-CM | POA: Diagnosis present

## 2023-11-08 DIAGNOSIS — Z51A Encounter for sepsis aftercare: Secondary | ICD-10-CM

## 2023-11-08 DIAGNOSIS — I959 Hypotension, unspecified: Secondary | ICD-10-CM | POA: Diagnosis not present

## 2023-11-08 DIAGNOSIS — R600 Localized edema: Secondary | ICD-10-CM | POA: Diagnosis present

## 2023-11-08 DIAGNOSIS — Z8673 Personal history of transient ischemic attack (TIA), and cerebral infarction without residual deficits: Secondary | ICD-10-CM | POA: Diagnosis present

## 2023-11-08 DIAGNOSIS — I69398 Other sequelae of cerebral infarction: Secondary | ICD-10-CM | POA: Diagnosis not present

## 2023-11-08 DIAGNOSIS — R519 Headache, unspecified: Secondary | ICD-10-CM | POA: Diagnosis not present

## 2023-11-08 DIAGNOSIS — K5900 Constipation, unspecified: Secondary | ICD-10-CM | POA: Diagnosis present

## 2023-11-08 DIAGNOSIS — D638 Anemia in other chronic diseases classified elsewhere: Secondary | ICD-10-CM | POA: Diagnosis present

## 2023-11-08 DIAGNOSIS — W1830XA Fall on same level, unspecified, initial encounter: Secondary | ICD-10-CM | POA: Diagnosis not present

## 2023-11-08 DIAGNOSIS — E785 Hyperlipidemia, unspecified: Secondary | ICD-10-CM | POA: Diagnosis present

## 2023-11-08 DIAGNOSIS — Z7951 Long term (current) use of inhaled steroids: Secondary | ICD-10-CM

## 2023-11-08 DIAGNOSIS — J449 Chronic obstructive pulmonary disease, unspecified: Secondary | ICD-10-CM | POA: Diagnosis present

## 2023-11-08 DIAGNOSIS — M25562 Pain in left knee: Secondary | ICD-10-CM | POA: Diagnosis present

## 2023-11-08 DIAGNOSIS — I5032 Chronic diastolic (congestive) heart failure: Secondary | ICD-10-CM | POA: Diagnosis present

## 2023-11-08 DIAGNOSIS — J9811 Atelectasis: Secondary | ICD-10-CM | POA: Diagnosis not present

## 2023-11-08 DIAGNOSIS — Z79899 Other long term (current) drug therapy: Secondary | ICD-10-CM

## 2023-11-08 DIAGNOSIS — D649 Anemia, unspecified: Secondary | ICD-10-CM | POA: Diagnosis present

## 2023-11-08 DIAGNOSIS — N1832 Chronic kidney disease, stage 3b: Secondary | ICD-10-CM | POA: Diagnosis present

## 2023-11-08 DIAGNOSIS — R630 Anorexia: Secondary | ICD-10-CM | POA: Diagnosis present

## 2023-11-08 DIAGNOSIS — Z825 Family history of asthma and other chronic lower respiratory diseases: Secondary | ICD-10-CM

## 2023-11-08 DIAGNOSIS — R918 Other nonspecific abnormal finding of lung field: Secondary | ICD-10-CM | POA: Diagnosis not present

## 2023-11-08 DIAGNOSIS — M8580 Other specified disorders of bone density and structure, unspecified site: Secondary | ICD-10-CM | POA: Diagnosis present

## 2023-11-08 DIAGNOSIS — Z91148 Patient's other noncompliance with medication regimen for other reason: Secondary | ICD-10-CM

## 2023-11-08 DIAGNOSIS — E669 Obesity, unspecified: Secondary | ICD-10-CM | POA: Diagnosis present

## 2023-11-08 DIAGNOSIS — I4821 Permanent atrial fibrillation: Secondary | ICD-10-CM | POA: Diagnosis present

## 2023-11-08 DIAGNOSIS — R531 Weakness: Secondary | ICD-10-CM | POA: Diagnosis present

## 2023-11-08 DIAGNOSIS — N179 Acute kidney failure, unspecified: Secondary | ICD-10-CM | POA: Diagnosis not present

## 2023-11-08 DIAGNOSIS — I69391 Dysphagia following cerebral infarction: Secondary | ICD-10-CM | POA: Diagnosis not present

## 2023-11-08 DIAGNOSIS — Z9181 History of falling: Secondary | ICD-10-CM

## 2023-11-08 DIAGNOSIS — I6349 Cerebral infarction due to embolism of other cerebral artery: Secondary | ICD-10-CM | POA: Diagnosis not present

## 2023-11-08 DIAGNOSIS — J9611 Chronic respiratory failure with hypoxia: Secondary | ICD-10-CM | POA: Diagnosis present

## 2023-11-08 DIAGNOSIS — G9341 Metabolic encephalopathy: Secondary | ICD-10-CM | POA: Diagnosis present

## 2023-11-08 DIAGNOSIS — M25561 Pain in right knee: Secondary | ICD-10-CM | POA: Diagnosis present

## 2023-11-08 DIAGNOSIS — I480 Paroxysmal atrial fibrillation: Secondary | ICD-10-CM | POA: Diagnosis not present

## 2023-11-08 DIAGNOSIS — Z9981 Dependence on supplemental oxygen: Secondary | ICD-10-CM | POA: Diagnosis not present

## 2023-11-08 DIAGNOSIS — J90 Pleural effusion, not elsewhere classified: Secondary | ICD-10-CM | POA: Diagnosis not present

## 2023-11-08 DIAGNOSIS — I5030 Unspecified diastolic (congestive) heart failure: Secondary | ICD-10-CM | POA: Diagnosis present

## 2023-11-08 DIAGNOSIS — R5381 Other malaise: Secondary | ICD-10-CM | POA: Diagnosis present

## 2023-11-08 DIAGNOSIS — L853 Xerosis cutis: Secondary | ICD-10-CM | POA: Diagnosis present

## 2023-11-08 DIAGNOSIS — R040 Epistaxis: Secondary | ICD-10-CM | POA: Diagnosis not present

## 2023-11-08 DIAGNOSIS — Y92238 Other place in hospital as the place of occurrence of the external cause: Secondary | ICD-10-CM | POA: Diagnosis not present

## 2023-11-08 DIAGNOSIS — Z7901 Long term (current) use of anticoagulants: Secondary | ICD-10-CM

## 2023-11-08 DIAGNOSIS — Z604 Social exclusion and rejection: Secondary | ICD-10-CM | POA: Diagnosis present

## 2023-11-08 DIAGNOSIS — K219 Gastro-esophageal reflux disease without esophagitis: Secondary | ICD-10-CM | POA: Diagnosis present

## 2023-11-08 DIAGNOSIS — J441 Chronic obstructive pulmonary disease with (acute) exacerbation: Secondary | ICD-10-CM | POA: Diagnosis present

## 2023-11-08 DIAGNOSIS — K5901 Slow transit constipation: Secondary | ICD-10-CM | POA: Diagnosis not present

## 2023-11-08 DIAGNOSIS — E559 Vitamin D deficiency, unspecified: Secondary | ICD-10-CM | POA: Diagnosis present

## 2023-11-08 DIAGNOSIS — I350 Nonrheumatic aortic (valve) stenosis: Secondary | ICD-10-CM | POA: Diagnosis present

## 2023-11-08 DIAGNOSIS — Z853 Personal history of malignant neoplasm of breast: Secondary | ICD-10-CM

## 2023-11-08 DIAGNOSIS — R2689 Other abnormalities of gait and mobility: Secondary | ICD-10-CM | POA: Diagnosis present

## 2023-11-08 MED ORDER — METOPROLOL SUCCINATE ER 25 MG PO TB24
25.0000 mg | ORAL_TABLET | Freq: Every day | ORAL | Status: DC
Start: 2023-11-08 — End: 2023-11-09
  Administered 2023-11-08: 25 mg via ORAL
  Filled 2023-11-08: qty 1

## 2023-11-08 MED ORDER — TRAZODONE HCL 50 MG PO TABS: 25.0000 mg | ORAL_TABLET | Freq: Every evening | ORAL | Status: DC | PRN

## 2023-11-08 MED ORDER — ALUM & MAG HYDROXIDE-SIMETH 200-200-20 MG/5ML PO SUSP
30.0000 mL | ORAL | Status: DC | PRN
  Administered 2023-11-18: 30 mL via ORAL
  Filled 2023-11-08: qty 30

## 2023-11-08 MED ORDER — BUDESON-GLYCOPYRROL-FORMOTEROL 160-9-4.8 MCG/ACT IN AERO
2.0000 | INHALATION_SPRAY | Freq: Two times a day (BID) | RESPIRATORY_TRACT | Status: DC
  Administered 2023-11-08 – 2023-11-23 (×27): 2 via RESPIRATORY_TRACT
  Filled 2023-11-08 (×2): qty 5.9

## 2023-11-08 MED ORDER — BISACODYL 10 MG RE SUPP: 10.0000 mg | Freq: Every day | RECTAL | Status: DC | PRN

## 2023-11-08 MED ORDER — APIXABAN 2.5 MG PO TABS
2.5000 mg | ORAL_TABLET | Freq: Two times a day (BID) | ORAL | Status: DC
  Administered 2023-11-08 – 2023-11-23 (×30): 2.5 mg via ORAL
  Filled 2023-11-08 (×30): qty 1

## 2023-11-08 MED ORDER — APIXABAN 2.5 MG PO TABS: 2.5000 mg | ORAL_TABLET | Freq: Two times a day (BID) | ORAL | 0 refills | Status: DC

## 2023-11-08 MED ORDER — GUAIFENESIN-DM 100-10 MG/5ML PO SYRP
5.0000 mL | ORAL_SOLUTION | Freq: Four times a day (QID) | ORAL | Status: DC | PRN
  Administered 2023-11-17 – 2023-11-21 (×2): 10 mL via ORAL
  Filled 2023-11-08 (×2): qty 10

## 2023-11-08 MED ORDER — PROCHLORPERAZINE 25 MG RE SUPP: 12.5000 mg | Freq: Four times a day (QID) | RECTAL | Status: DC | PRN

## 2023-11-08 MED ORDER — FLEET ENEMA RE ENEM: 1.0000 | ENEMA | Freq: Once | RECTAL | Status: DC | PRN

## 2023-11-08 MED ORDER — ATORVASTATIN CALCIUM 80 MG PO TABS
80.0000 mg | ORAL_TABLET | Freq: Every day | ORAL | Status: DC
  Administered 2023-11-09 – 2023-11-23 (×15): 80 mg via ORAL
  Filled 2023-11-08 (×15): qty 1

## 2023-11-08 MED ORDER — SENNA 8.6 MG PO TABS
1.0000 | ORAL_TABLET | Freq: Every day | ORAL | Status: DC
  Administered 2023-11-08 – 2023-11-15 (×6): 8.6 mg via ORAL
  Filled 2023-11-08 (×7): qty 1

## 2023-11-08 MED ORDER — PROCHLORPERAZINE MALEATE 5 MG PO TABS: 5.0000 mg | ORAL_TABLET | Freq: Four times a day (QID) | ORAL | Status: DC | PRN

## 2023-11-08 MED ORDER — PROCHLORPERAZINE EDISYLATE 10 MG/2ML IJ SOLN: 5.0000 mg | Freq: Four times a day (QID) | INTRAMUSCULAR | Status: DC | PRN

## 2023-11-08 MED ORDER — MELATONIN 3 MG PO TABS: 3.0000 mg | ORAL_TABLET | Freq: Every evening | ORAL | Status: DC | PRN

## 2023-11-08 MED ORDER — ACETAMINOPHEN 325 MG PO TABS
325.0000 mg | ORAL_TABLET | ORAL | Status: DC | PRN
  Administered 2023-11-09 – 2023-11-15 (×9): 650 mg via ORAL
  Filled 2023-11-08 (×11): qty 2

## 2023-11-08 MED ORDER — ATORVASTATIN CALCIUM 80 MG PO TABS: 80.0000 mg | ORAL_TABLET | Freq: Every day | ORAL | 0 refills | Status: DC

## 2023-11-08 MED ORDER — IPRATROPIUM-ALBUTEROL 0.5-2.5 (3) MG/3ML IN SOLN
3.0000 mL | Freq: Four times a day (QID) | RESPIRATORY_TRACT | Status: DC | PRN
  Administered 2023-11-09 – 2023-11-23 (×9): 3 mL via RESPIRATORY_TRACT
  Filled 2023-11-08 (×6): qty 3
  Filled 2023-11-08: qty 9
  Filled 2023-11-08 (×2): qty 3

## 2023-11-08 MED ORDER — HYDRALAZINE HCL 25 MG PO TABS
25.0000 mg | ORAL_TABLET | Freq: Two times a day (BID) | ORAL | Status: DC
Start: 2023-11-08 — End: 2023-11-09
  Administered 2023-11-08 – 2023-11-09 (×2): 25 mg via ORAL
  Filled 2023-11-08 (×2): qty 1

## 2023-11-08 NOTE — Progress Notes (Signed)
 Inpatient Rehab Admissions Coordinator:   Awaiting insurance determination  Reche Lowers, [place], DPT Admissions Coordinator [phone] 11/08/23  9:55 AM

## 2023-11-08 NOTE — Discharge Summary (Signed)
 Physician Discharge Summary  Courtney Cline DOB: 1933/08/10 DOA: 11/04/2023  PCP: Courtney Charm, MD  Admit date: 11/04/2023 Discharge date: 11/08/2023 30 Day Unplanned Readmission Risk Score    Flowsheet Row ED to Hosp-Admission (Current) from 11/04/2023 in [facility]  30 Day Unplanned Readmission Risk Score (%) 18.72 Filed at 11/08/2023 1200    This score is the patient's risk of an unplanned readmission within 30 days of being discharged (0 -100%). The score is based on dignosis, age, lab data, medications, orders, and past utilization.   Low:  0-14.9   Medium: 15-21.9   High: 22-29.9   Extreme: 30 and above          Admitted From: Home Disposition: CIR  Recommendations for Outpatient Follow-up:  Follow up with PCP in 1-2 weeks Please obtain BMP/CBC in one week Follow-up with neurology in 4 to 6 weeks. Please follow up with your PCP on the following pending results: Unresulted Labs (From admission, onward)    None       Home Health: None Equipment/Devices: None  Discharge Condition: Stable CODE STATUS: DNR Diet recommendation:  Diet Order             DIET DYS 3 Room service appropriate? Yes with Assist; Fluid consistency: Thin  Diet effective 1000                   Subjective: Patient seen and examined, son at the bedside.  Patient has no complaints.  Brief/Interim Summary: Courtney Cline is a 89 y.o. female with medical history significant of breast cancer, COPD, HTN, and HLD p/w altered mental status, confusion, as well as some dysarthria and suspected stroke found to have acute metabolic encephalopathy in the setting of sepsis 2/2 acute cystitis vs stroke    Of note, she initially presented as a code stroke. CT head showed no acute process. She was evaluated by Dr. Merrianne of neurology, who felt that this presentation was more consistent with a metabolic process as opposed to a TIA or acute ischemic  stroke.    Eventually MRI brain shows Acute left thalamocapsular infarct.   MRA head without contrast shows Left PCA P2 segment occlusion with intermittent distal reconstitution.  Neurology consulted , recommendation given.  Per neurology, likely etiology is embolic due to having history of atrial fibrillation and she is noncompliant with anticoagulation.  Eliquis  was resumed.  She was seen by PT OT, they recommended CIR, she is being discharged to CIR today in stable condition.  Echo showed EF of 60 to 65% with grade 2 diastolic dysfunction with no change compared to the previous echo done almost 2-1/2 years ago.  Hemoglobin A1c 5.2.  LDL 41 however patient was on Crestor  PTA which has been transition to atorvastatin  by neurology.   Acute metabolic encephalopathy secondary to stroke.  Patient is now alert and oriented.   Dysphagia: Followed by SLP, patient on dysphagia 3 diet and tolerating.   PAF  Non compliant to Eliquis .  Rate controlled with metoprolol .  Restarted on Eliquis . Pt not comfortable restarting eliquis  but understands the risk of stroke and agreeable.   Hypertension Patient on hydralazine  and metoprolol .  Well controlled BP parameters.    hyperlipidemia Crestor  transition to lipitor .    AKI Creatinine at baseline between 1.2 to 1.5. Admitted with a creatinine of 1.9, improved to 1.6  to 1.5 with IV fluids.    Possible UTI; Completed 3 days of IV Ceftriaxone .   Discharge  plan was discussed with patient and/or family member and they verbalized understanding and agreed with it.  Discharge Diagnoses:  Principal Problem:   Acute metabolic encephalopathy    Discharge Instructions   Allergies as of 11/08/2023   No Known Allergies      Medication List     STOP taking these medications    rosuvastatin  40 MG tablet Commonly known as: CRESTOR        TAKE these medications    albuterol  108 (90 Base) MCG/ACT inhaler Commonly known as: VENTOLIN  HFA INHALE  TWO PUFFS into THE lungs EVERY 6 HOURS AS NEEDED FOR wheezing OR SHORTNESS OF BREATH   apixaban  2.5 MG Tabs tablet Commonly known as: Eliquis  Take 1 tablet (2.5 mg total) by mouth 2 (two) times daily.   atorvastatin  80 MG tablet Commonly known as: LIPITOR  Take 1 tablet (80 mg total) by mouth daily. Start taking on: November 09, 2023   furosemide  40 MG tablet Commonly known as: LASIX  Take 40 mg by mouth 2 (two) times daily.   hydrALAZINE  25 MG tablet Commonly known as: APRESOLINE  Take 25 mg by mouth 2 (two) times daily.   metoprolol  succinate 25 MG 24 hr tablet Commonly known as: TOPROL -XL Take 25 mg by mouth at bedtime.   OXYGEN  Inhale 4 L into the lungs continuous.   Trelegy Ellipta  200-62.5-25 MCG/ACT Aepb Generic drug: Fluticasone -Umeclidin-Vilant Inhale 1 puff into the lungs daily.   Vitamin B 12 500 MCG Tabs Take 1 tablet by mouth daily.   Vitamin D  50 MCG (2000 UT) Caps Take 2,000 Units by mouth daily.        Follow-up Information     Courtney Charm, MD .   Specialty: Internal Medicine Contact information: [address] [place] [address] [phone]         Courtney Charm, MD Follow up in 1 week(s).   Specialty: Internal Medicine Contact information: [address] [name] [address] [phone]                No Known Allergies  Consultations: Neurology   Procedures/Studies: ECHOCARDIOGRAM COMPLETE BUBBLE STUDY Result Date: 11/06/2023    ECHOCARDIOGRAM REPORT   Patient Name:   Courtney Cline Date of Exam: 11/06/2023 Medical Rec #:  [id number]              Height:       62.0 in Accession #:    [id number]             Weight:       142.6 lb Date of Birth:  March 17, 1933               BSA:          1.656 m Patient Age:    89 years               BP:           110/79 mmHg Patient Gender: F                      HR:           89 bpm. Exam Location:  Inpatient Procedure: 2D Echo, Cardiac  Doppler, Color Doppler and Saline Contrast Bubble            Study (Both Spectral and Color Flow Doppler were utilized during            procedure). Indications:    Stroke I63.9  History:        Patient has prior history of Echocardiogram examinations, most                 recent 02/16/2021. CHF, Previous Myocardial Infarction, COPD and                 CKD, stage 3, Arrythmias:Atrial Fibrillation; Risk                 Factors:Hypertension and Dyslipidemia.  Sonographer:    Thea Norlander RCS Referring Phys: MARSHA ADA IMPRESSIONS  1. Left ventricular ejection fraction, by estimation, is 60 to 65%. The left ventricle has normal function. The left ventricle has no regional wall motion abnormalities. Left ventricular diastolic parameters are consistent with Grade II diastolic dysfunction (pseudonormalization).  2. Right ventricular systolic function is normal. The right ventricular size is normal. There is severely elevated pulmonary artery systolic pressure. The estimated right ventricular systolic pressure is 67.0 mmHg.  3. Left atrial size was moderately dilated.  4. Negative bubble study, no evidence for PFO or ASD.  5. MVA 1.08 cm^2 by VTI. The mitral valve is degenerative. Trivial mitral valve regurgitation. Severe mitral stenosis. The mean mitral valve gradient is 9.0 mmHg. Moderate to severe mitral annular calcification.  6. Tricuspid valve regurgitation is moderate.  7. The aortic valve is tricuspid. Aortic valve regurgitation is trivial. Moderate aortic valve stenosis. Aortic valve area, by VTI measures 1.21 cm. Aortic valve mean gradient measures 17.0 mmHg.  8. The inferior vena cava is normal in size with greater than 50% respiratory variability, suggesting right atrial pressure of 3 mmHg. FINDINGS  Left Ventricle: Left ventricular ejection fraction, by estimation, is 60 to 65%. The left ventricle has normal function. The left ventricle has no regional wall motion abnormalities. The left ventricular  internal cavity size was normal in size. There is  no left ventricular hypertrophy. Left ventricular diastolic parameters are consistent with Grade II diastolic dysfunction (pseudonormalization). Right Ventricle: The right ventricular size is normal. No increase in right ventricular wall thickness. Right ventricular systolic function is normal. There is severely elevated pulmonary artery systolic pressure. The tricuspid regurgitant velocity is 4.00 m/s, and with an assumed right atrial pressure of 3 mmHg, the estimated right ventricular systolic pressure is 67.0 mmHg. Left Atrium: Left atrial size was moderately dilated. Right Atrium: Right atrial size was normal in size. Pericardium: There is no evidence of pericardial effusion. Mitral Valve: MVA 1.08 cm^2 by VTI. The mitral valve is degenerative in appearance. There is severe calcification of the mitral valve leaflet(s). Moderate to severe mitral annular calcification. Trivial mitral valve regurgitation. Severe mitral valve stenosis. MV peak gradient, 16.5 mmHg. The mean mitral valve gradient is 9.0 mmHg. Tricuspid Valve: The tricuspid valve is normal in structure. Tricuspid valve regurgitation is moderate. Aortic Valve: The aortic valve is tricuspid. Aortic valve regurgitation is trivial. Moderate aortic stenosis is present. Aortic valve mean gradient measures 17.0 mmHg. Aortic valve peak gradient measures 24.9 mmHg. Aortic valve area, by VTI measures 1.21  cm. Pulmonic Valve: The pulmonic valve was normal in structure. Pulmonic valve regurgitation is not visualized. Aorta: The aortic root is normal in size and structure. Venous: The inferior vena cava is normal in size with greater than 50% respiratory variability, suggesting right atrial pressure of 3 mmHg. IAS/Shunts: Negative bubble study, no evidence for PFO or ASD. Agitated saline contrast was given intravenously to evaluate for intracardiac shunting.  LEFT VENTRICLE PLAX 2D LVIDd:  4.30 cm    Diastology LVIDs:         2.70 cm   LV e' medial:    11.20 cm/s LV PW:         0.70 cm   LV E/e' medial:  16.9 LV IVS:        0.80 cm   LV e' lateral:   10.30 cm/s LVOT diam:     1.90 cm   LV E/e' lateral: 18.3 LV SV:         54 LV SV Index:   33 LVOT Area:     2.84 cm  RIGHT VENTRICLE             IVC RV S prime:     20.50 cm/s  IVC diam: 1.30 cm TAPSE (M-mode): 1.8 cm LEFT ATRIUM             Index        RIGHT ATRIUM           Index LA diam:        3.90 cm 2.36 cm/m   RA Area:     15.70 cm LA Vol (A2C):   57.2 ml 34.54 ml/m  RA Volume:   37.45 ml  22.62 ml/m LA Vol (A4C):   62.0 ml 37.44 ml/m LA Biplane Vol: 60.3 ml 36.42 ml/m  AORTIC VALVE AV Area (Vmax):    1.27 cm AV Area (Vmean):   1.23 cm AV Area (VTI):     1.21 cm AV Vmax:           249.67 cm/s AV Vmean:          176.000 cm/s AV VTI:            0.451 m AV Peak Grad:      24.9 mmHg AV Mean Grad:      17.0 mmHg LVOT Vmax:         112.00 cm/s LVOT Vmean:        76.100 cm/s LVOT VTI:          0.192 m LVOT/AV VTI ratio: 0.43  AORTA Ao Root diam: 3.30 cm Ao Asc diam:  3.40 cm MITRAL VALVE                TRICUSPID VALVE MV Area (PHT): 3.31 cm     TR Peak grad:   64.0 mmHg MV Area VTI:   1.08 cm     TR Vmax:        400.00 cm/s MV Peak grad:  16.5 mmHg MV Mean grad:  9.0 mmHg     SHUNTS MV Vmax:       2.03 m/s     Systemic VTI:  0.19 m MV Vmean:      137.5 cm/s   Systemic Diam: 1.90 cm MV Decel Time: 229 msec MV E velocity: 189.00 cm/s MV A velocity: 135.00 cm/s MV E/A ratio:  1.40 Dalton McleanMD Electronically signed by Ezra Kanner Signature Date/Time: 11/06/2023/6:21:01 PM    Final    VAS US  CAROTID Result Date: 11/05/2023 Carotid Arterial Duplex Study Patient Name:  ALLIYAH ROESLER  Date of Exam:   11/05/2023 Medical Rec #: [id number]               Accession #:    [id number] Date of Birth: 1933/04/18                Patient Gender: F Patient Age:   56 years Exam Location:  [facility] Procedure:      VAS US  CAROTID Referring Phys:  MARSHA ADA --------------------------------------------------------------------------------  Indications:       CVA. Risk Factors:      Hypertension, hyperlipidemia, Diabetes. Comparison Study:  Previous study on 4.10.2014. Performing Technologist: Edilia Elden Appl  Examination Guidelines: A complete evaluation includes B-mode imaging, spectral Doppler, color Doppler, and power Doppler as needed of all accessible portions of each vessel. Bilateral testing is considered an integral part of a complete examination. Limited examinations for reoccurring indications may be performed as noted.  Right Carotid Findings: +----------+--------+--------+--------+-------------------------+--------+           PSV cm/sEDV cm/sStenosisPlaque Description       Comments +----------+--------+--------+--------+-------------------------+--------+ CCA Prox  46                                                        +----------+--------+--------+--------+-------------------------+--------+ CCA Distal59      14              heterogenous and calcific         +----------+--------+--------+--------+-------------------------+--------+ ICA Prox  88      26              heterogenous and calcific         +----------+--------+--------+--------+-------------------------+--------+ ICA Mid   93      24                                                +----------+--------+--------+--------+-------------------------+--------+ ICA Distal76      29                                                +----------+--------+--------+--------+-------------------------+--------+ ECA       93                                                        +----------+--------+--------+--------+-------------------------+--------+ +----------+--------+-------+----------------+-------------------+           PSV cm/sEDV cmsDescribe        Arm Pressure (mmHG) +----------+--------+-------+----------------+-------------------+  [id number]             Multiphasic, WNL                    +----------+--------+-------+----------------+-------------------+ +---------+--------+--+--------+--+---------+ VertebralPSV cm/s75EDV cm/s14Antegrade +---------+--------+--+--------+--+---------+  Left Carotid Findings: +----------+--------+--------+--------+-------------------------+--------+           PSV cm/sEDV cm/sStenosisPlaque Description       Comments +----------+--------+--------+--------+-------------------------+--------+ CCA Prox  66      17                                                +----------+--------+--------+--------+-------------------------+--------+ CCA Distal54      12              heterogenous  and calcific         +----------+--------+--------+--------+-------------------------+--------+ ICA Prox  112     27              heterogenous and calcific         +----------+--------+--------+--------+-------------------------+--------+ ICA Mid   154     30      1-39%                                     +----------+--------+--------+--------+-------------------------+--------+ ICA Distal114     39      1-39%                                     +----------+--------+--------+--------+-------------------------+--------+ ECA       61      8                                                 +----------+--------+--------+--------+-------------------------+--------+ +----------+--------+--------+----------------+-------------------+           PSV cm/sEDV cm/sDescribe        Arm Pressure (mmHG) +----------+--------+--------+----------------+-------------------+ [id number]              Multiphasic, WNL                    +----------+--------+--------+----------------+-------------------+ +---------+--------+--+--------+----------+ VertebralPSV cm/s74EDV cm/sRetrograde +---------+--------+--+--------+----------+ Retrograde flow noted in the left vertebral artery,  suggesting subclavian steal.  Summary: Right Carotid: Velocities in the right ICA are consistent with a 1-39% stenosis. Left Carotid: Velocities in the left ICA are consistent with a 1-39% stenosis. Vertebrals:  Right vertebral artery demonstrates antegrade flow. Left vertebral              artery demonstrates retrograde flow. Subclavians: Normal flow hemodynamics were seen in bilateral subclavian              arteries. *See table(s) above for measurements and observations.  Electronically signed by Penne Colorado MD on 11/05/2023 at 7:42:09 PM.    Final    MR ANGIO HEAD WO CONTRAST Result Date: 11/05/2023 EXAM: MR Angiography Head without intravenous Contrast. 11/05/2023 01:57:00 AM TECHNIQUE: Magnetic resonance angiography images of the head without intravenous contrast. Multiplanar 2D and 3D reformatted images are provided for review. COMPARISON: None provided. CLINICAL HISTORY: Stroke/TIA, determine embolic source. FINDINGS: ANTERIOR CIRCULATION: No significant stenosis of the internal carotid arteries. No significant stenosis of the anterior cerebral arteries. No significant stenosis of the middle cerebral arteries. No aneurysm. POSTERIOR CIRCULATION: There is occlusion of the left PCA distal P2 segment with intermittent distal reconstitution. No significant stenosis of the basilar artery. No significant stenosis of the vertebral arteries. No aneurysm. IMPRESSION: 1. Left PCA P2 segment occlusion with intermittent distal reconstitution. Electronically signed by: Franky Stanford MD 11/05/2023 02:11 AM EDT RP Workstation: [id number]   MR BRAIN WO CONTRAST Result Date: 11/04/2023 CLINICAL DATA:  Altered mental status, nontraumatic (Ped 0-17y) EXAM: MRI HEAD WITHOUT CONTRAST TECHNIQUE: Multiplanar, multiecho pulse sequences of the brain and surrounding structures were obtained without intravenous contrast. COMPARISON:  CT head from earlier today. FINDINGS: Brain: Acute left thalamocapsular infarct. No mass effect.  No midline shift. No acute hemorrhage. Moderate T2/FLAIR hyperintensities in the white matter which are compatible with chronic  microvascular ischemic change. No mass lesion. Small amount of curvilinear susceptibility artifact along the posterior parietooccipital convexities bilaterally suggestive of superficial siderosis. Vascular: Major arterial flow voids are maintained at the skull base. Skull and upper cervical spine: Normal marrow signal. Sinuses/Orbits: Clear sinuses.  No acute orbital findings. Other: No mastoid effusions IMPRESSION: 1. Acute left thalamocapsular infarct. 2. Moderate chronic microvascular disease. 3. Suspected small amount of superficial siderosis along the parieto-occipital convexities. Electronically Signed   By: Gilmore GORMAN Molt M.D.   On: 11/04/2023 15:43   DG Chest Portable 1 View Result Date: 11/04/2023 EXAM: 1 VIEW XRAY OF THE CHEST 11/04/2023 02:43:53 AM COMPARISON: 10/11/2023 CLINICAL HISTORY: painetc. FINDINGS: LUNGS AND PLEURA: Mild bibasilar opacities, likely atelectasis. No pleural effusion. No pneumothorax. HEART AND MEDIASTINUM: Stable mild cardiomegaly. Thoracic aortic atherosclerosis. BONES AND SOFT TISSUES: No acute osseous abnormality. IMPRESSION: 1. Mild bibasilar opacities, likely atelectasis. 2. No acute abnormalities. Electronically signed by: Pinkie Pebbles MD 11/04/2023 02:47 AM EDT RP Workstation: [id number]   CT HEAD CODE STROKE WO CONTRAST Result Date: 11/04/2023 EXAM: CT HEAD WITHOUT CONTRAST 11/04/2023 01:51:22 AM TECHNIQUE: CT of the head was performed without the administration of intravenous contrast. Automated exposure control, iterative reconstruction, and/or weight based adjustment of the mA/kV was utilized to reduce the radiation dose to as low as reasonably achievable. COMPARISON: None available. CLINICAL HISTORY: Neuro deficit, acute, stroke suspected. Stroke; Dr. Merrianne FINDINGS: BRAIN AND VENTRICLES: Chronic ischemic white matter changes.  Mild volume loss. ASPECTS is 10. Old cerebellar small vessel infarcts. No acute hemorrhage. No evidence of acute infarct. No hydrocephalus. No extra-axial collection. No mass effect or midline shift. ORBITS: No acute abnormality. SINUSES: No acute abnormality. SOFT TISSUES AND SKULL: No acute soft tissue abnormality. No skull fracture. IMPRESSION: 1. No acute intracranial abnormality. ASPECTS is 10. 2. Chronic ischemic white matter changes, mild volume loss, and old cerebellar small vessel infarcts. 3. Findings communicated to Dr. Lindzen at 2:01 am on 11/04/2023. Electronically signed by: Franky Stanford MD 11/04/2023 02:02 AM EDT RP Workstation: [id number]   DG Chest Portable 1 View Result Date: 10/11/2023 CLINICAL DATA:  Increased cough and sputum production, COPD exacerbation versus pneumonia. EXAM: PORTABLE CHEST 1 VIEW COMPARISON:  05/14/2023. FINDINGS: Redemonstration of small right pleural effusion, similar to the prior study. There is minimal blunting of left lateral costophrenic angle and underlying trace left pleural effusion can not be excluded. Bilateral lung fields are otherwise clear. No dense consolidation or lung collapse. Stable cardio-mediastinal silhouette. No acute osseous abnormalities. The soft tissues are within normal limits. IMPRESSION: Redemonstration of small right pleural effusion, similar to the prior study. There is minimal blunting of left lateral costophrenic angle and underlying trace left pleural effusion can not be excluded. Bilateral lung fields are otherwise clear. No dense consolidation or lung collapse. Electronically Signed   By: Ree Molt M.D.   On: 10/11/2023 13:56     Discharge Exam: Vitals:   11/08/23 0740 11/08/23 1149  BP: (!) 131/50 (!) 123/40  Pulse: 74 69  Resp: 19 19  Temp: 97.9 F (36.6 C) 98 F (36.7 C)  SpO2: 99% 100%   Vitals:   11/08/23 0350 11/08/23 0422 11/08/23 0740 11/08/23 1149  BP: (!) 137/46  (!) 131/50 (!) 123/40  Pulse: 83  74 69   Resp: 18  19 19   Temp: 97.7 F (36.5 C)  97.9 F (36.6 C) 98 F (36.7 C)  TempSrc: Oral     SpO2: 100%  99% 100%  Weight:  66.5 kg  Height:        General: Pt is alert, awake, not in acute distress Cardiovascular: RRR, S1/S2 +, no rubs, no gallops Respiratory: CTA bilaterally, no wheezing, no rhonchi Abdominal: Soft, NT, ND, bowel sounds + Extremities: no edema, no cyanosis    The results of significant diagnostics from this hospitalization (including imaging, microbiology, ancillary and laboratory) are listed below for reference.     Microbiology: Recent Results (from the past 240 hours)  Blood culture (routine x 2)     Status: None (Preliminary result)   Collection Time: 11/04/23  3:00 AM   Specimen: BLOOD  Result Value Ref Range Status   Specimen Description BLOOD SITE NOT SPECIFIED  Final   Special Requests   Final    BOTTLES DRAWN AEROBIC AND ANAEROBIC Blood Culture results may not be optimal due to an inadequate volume of blood received in culture bottles   Culture   Final    NO GROWTH 4 DAYS Performed at [facility], [address], [place], [name] [address]    Report Status PENDING  Incomplete  Blood culture (routine x 2)     Status: None (Preliminary result)   Collection Time: 11/04/23  4:35 AM   Specimen: BLOOD RIGHT ARM  Result Value Ref Range Status   Specimen Description BLOOD RIGHT ARM  Final   Special Requests BOTTLES DRAWN AEROBIC ONLY  Final   Culture   Final    NO GROWTH 4 DAYS Performed at [facility], [address], [place], [name] [address]    Report Status PENDING  Incomplete  Urine Culture     Status: Abnormal   Collection Time: 11/04/23  7:49 AM   Specimen: Urine, Clean Catch  Result Value Ref Range Status   Specimen Description URINE, CLEAN CATCH  Final   Special Requests Normal  Final   Culture (A)  Final    <10,000 COLONIES/mL INSIGNIFICANT GROWTH Performed at [facility], [address] [place],  [name] [address]    Report Status 11/05/2023 FINAL  Final     Labs: BNP (last 3 results) Recent Labs    02/13/23 2158  BNP 361.3*   Basic Metabolic Panel: Recent Labs  Lab 11/04/23 0144 11/04/23 0147 11/04/23 0435 11/06/23 0141  NA 137 136 139 139  K 3.7 3.6 4.0 4.4  CL 98 98 103 103  CO2  --  26 24 25   GLUCOSE 119* 117* 134* 95  BUN 24* 22 21 19   CREATININE 1.90* 1.83* 1.66* 1.51*  CALCIUM   --  9.1 8.6* 8.6*  MG  --   --  1.8  --    Liver Function Tests: Recent Labs  Lab 11/04/23 0147 11/04/23 0435  AST 16 19  ALT 8 6  ALKPHOS 61 58  BILITOT 0.3 0.8  PROT 6.8 6.6  ALBUMIN 3.1* 2.9*   No results for input(s): LIPASE, AMYLASE in the last 168 hours. No results for input(s): AMMONIA in the last 168 hours. CBC: Recent Labs  Lab 11/04/23 0144 11/04/23 0147 11/04/23 0435  WBC  --  9.9 8.1  NEUTROABS  --  7.4 7.0  HGB 10.2* 9.7* 9.3*  HCT 30.0* 31.9* 30.9*  MCV  --  96.4 95.7  PLT  --  295 266   Cardiac Enzymes: No results for input(s): CKTOTAL, CKMB, CKMBINDEX, TROPONINI in the last 168 hours. BNP: Invalid input(s): POCBNP CBG: Recent Labs  Lab 11/04/23 0139  GLUCAP 111*   D-Dimer No results  for input(s): DDIMER in the last 72 hours. Hgb A1c No results for input(s): HGBA1C in the last 72 hours. Lipid Profile Recent Labs    11/06/23 0141  CHOL 99  HDL 46  LDLCALC 41  TRIG 60  CHOLHDL 2.2   Thyroid  function studies No results for input(s): TSH, T4TOTAL, T3FREE, THYROIDAB in the last 72 hours.  Invalid input(s): FREET3 Anemia work up No results for input(s): VITAMINB12, FOLATE, FERRITIN, TIBC, IRON , RETICCTPCT in the last 72 hours. Urinalysis    Component Value Date/Time   COLORURINE YELLOW 11/04/2023 0210   APPEARANCEUR HAZY (A) 11/04/2023 0210   LABSPEC 1.006 11/04/2023 0210   PHURINE 5.0 11/04/2023 0210   GLUCOSEU NEGATIVE 11/04/2023 0210   HGBUR SMALL (A) 11/04/2023 0210   BILIRUBINUR NEGATIVE  11/04/2023 0210   KETONESUR NEGATIVE 11/04/2023 0210   PROTEINUR NEGATIVE 11/04/2023 0210   NITRITE NEGATIVE 11/04/2023 0210   LEUKOCYTESUR LARGE (A) 11/04/2023 0210   Sepsis Labs Recent Labs  Lab 11/04/23 0147 11/04/23 0435  WBC 9.9 8.1   Microbiology Recent Results (from the past 240 hours)  Blood culture (routine x 2)     Status: None (Preliminary result)   Collection Time: 11/04/23  3:00 AM   Specimen: BLOOD  Result Value Ref Range Status   Specimen Description BLOOD SITE NOT SPECIFIED  Final   Special Requests   Final    BOTTLES DRAWN AEROBIC AND ANAEROBIC Blood Culture results may not be optimal due to an inadequate volume of blood received in culture bottles   Culture   Final    NO GROWTH 4 DAYS Performed at [facility], [address], [place], [name] [address]    Report Status PENDING  Incomplete  Blood culture (routine x 2)     Status: None (Preliminary result)   Collection Time: 11/04/23  4:35 AM   Specimen: BLOOD RIGHT ARM  Result Value Ref Range Status   Specimen Description BLOOD RIGHT ARM  Final   Special Requests BOTTLES DRAWN AEROBIC ONLY  Final   Culture   Final    NO GROWTH 4 DAYS Performed at [facility], [address] [place], [name] [address]    Report Status PENDING  Incomplete  Urine Culture     Status: Abnormal   Collection Time: 11/04/23  7:49 AM   Specimen: Urine, Clean Catch  Result Value Ref Range Status   Specimen Description URINE, CLEAN CATCH  Final   Special Requests Normal  Final   Culture (A)  Final    <10,000 COLONIES/mL INSIGNIFICANT GROWTH Performed at [facility], [address], [place], [name] [address]    Report Status 11/05/2023 FINAL  Final    FURTHER DISCHARGE INSTRUCTIONS:   Get Medicines reviewed and adjusted: Please take all your medications with you for your next visit with your Primary MD   Laboratory/radiological data: Please request your Primary MD to go over all hospital tests and  procedure/radiological results at the follow up, please ask your Primary MD to get all Hospital records sent to his/her office.   In some cases, they will be blood work, cultures and biopsy results pending at the time of your discharge. Please request that your primary care M.D. goes through all the records of your hospital data and follows up on these results.   Also Note the following: If you experience worsening of your admission symptoms, develop shortness of breath, life threatening emergency, suicidal or homicidal thoughts you must seek medical  attention immediately by calling 911 or calling your MD immediately  if symptoms less severe.   You must read complete instructions/literature along with all the possible adverse reactions/side effects for all the Medicines you take and that have been prescribed to you. Take any new Medicines after you have completely understood and accpet all the possible adverse reactions/side effects.    patient was instructed, not to drive, operate heavy machinery, perform activities at heights, swimming or participation in water activities or provide baby-sitting services while on Pain, Sleep and Anxiety Medications; until their outpatient Physician has advised to do so again. Also recommended to not to take more than prescribed Pain, Sleep and Anxiety Medications.  It is not advisable to combine anxiety, sleep and pain medications without talking with your primary care provider.     Wear Seat belts while driving.   Please note: You were cared for by a hospitalist during your hospital stay. Once you are discharged, your primary care physician will handle any further medical issues. Please note that NO REFILLS for any discharge medications will be authorized once you are discharged, as it is imperative that you return to your primary care physician (or establish a relationship with a primary care physician if you do not have one) for your post hospital discharge needs  so that they can reassess your need for medications and monitor your lab values  Time coordinating discharge: Over 30 minutes  SIGNED:   Fredia Skeeter, MD  [facility] 11/08/2023, 1:43 PM *Please note that this is a verbal dictation therefore any spelling or grammatical errors are due to the Dragon Medical One system interpretation. If 7PM-7AM, please contact night-coverage [email or web address]

## 2023-11-08 NOTE — Progress Notes (Signed)
 Inpatient Rehab Admissions Coordinator:    I have insurance approval and a bed available for pt to admit to CIR today. Dr. Vernon in agreement and [facility] aware.  I've notified pt/family and they are in agreement to admit today.  I will make arrangements.    Reche Lowers, PT, DPT Admissions Coordinator [phone] 11/08/23  2:04 PM

## 2023-11-08 NOTE — TOC Transition Note (Signed)
 Transition of Care [facility]) - Discharge Note   Patient Details  Name: Courtney Cline MRN: [id number] Date of Birth: 08-06-1933  Transition of [facility]) CM/SW Contact:  Andrez JULIANNA George, RN Phone Number: 11/08/2023, 1:29 PM   Clinical Narrative:     Pt is discharging to CIR today. CM signing off.   Final next level of care: IP Rehab Facility Barriers to Discharge: No Barriers Identified   Patient Goals and CMS Choice   CMS Medicare.gov Compare Post Acute Care list provided to:: Patient Choice offered to / list presented to : Patient      Discharge Placement                       Discharge Plan and Services Additional resources added to the After Visit Summary for     Discharge Planning Services: CM Consult Post Acute Care Choice: IP Rehab                               Social Drivers of Health (SDOH) Interventions SDOH Screenings   Food Insecurity: No Food Insecurity (11/05/2023)  Housing: Low Risk  (11/05/2023)  Transportation Needs: No Transportation Needs (11/05/2023)  Utilities: Not At Risk (11/05/2023)  Social Connections: Socially Isolated (11/05/2023)  Tobacco Use: Medium Risk (11/06/2023)     Readmission Risk Interventions    02/15/2023    1:37 PM  Readmission Risk Prevention Plan  Transportation Screening Complete  PCP or Specialist Appt within 5-7 Days Complete  Home Care Screening Complete  Medication Review (RN CM) Complete

## 2023-11-08 NOTE — Progress Notes (Signed)
 Report given to RN Ninetta. Patient to be wheeled up by NT.

## 2023-11-08 NOTE — H&P (Shared)
 Physical Medicine and Rehabilitation Admission H&P    CC: Functional deficits due to CVA  HPI: Courtney Cline is a 89 year old female with past medical history significant for breast cancer, COPD, hypertension, and hyperlipidemia. The patient initially presented to [facility] on 11/04/2023 as a possible stroke alert due to altered mental status, confusion, and dysarthria. A CT scan of the head showed no acute process. However, an MRI revealed an acute left thalamic lacunar infarct, and an MRA of the head without contrast showed left PCA P2 segment occlusion with intermittent distal reconstitution. Neurology assessed that the etiology was embolic due to a history of atrial fibrillation and noncompliance with anticoagulation therapy. Consequently, the patient was resumed on Eliquis . A transthoracic echocardiogram showed an ejection fraction of 60-65% with grade two diastolic dysfunction, with no change compared to a previous echocardiogram.  The hospital course was complicated by acute metabolic encephalopathy secondary to the stroke and UTI secondary to acute cystitis. The patient completed a 3 day course of IV ceftriaxone . The patient was also evaluated by speech therapy for dysphagia and recommended a dysphagia 3 (D3) diet. Previously, the patient was independent with a rolling walker and family assistance for stair navigation, but currently requires minimal assistance for mobility and moderate assistance for transfers and ADLs. Therapy evaluations completed due to patient decreased functional mobility was admitted for a comprehensive rehab program.     ROS   Past Medical History:  Diagnosis Date   Allergic rhinitis    Arthritis    Breast calcification seen on mammogram    Right breast   Breast cancer (HCC)    COPD (chronic obstructive pulmonary disease) (HCC)    GERD (gastroesophageal reflux disease)    Hyperglycemia    Hyperlipidemia    Hypertension    Low back pain     On home oxygen  therapy    all the time   Osteopenia    Peripheral edema    Shortness of breath    Subclavian artery stenosis, left    Vitamin D  deficiency    Wears dentures    top   Wears glasses    reading   Past Surgical History:  Procedure Laterality Date   BREAST BIOPSY     BREAST LUMPECTOMY     right 2015   BREAST LUMPECTOMY WITH RADIOACTIVE SEED LOCALIZATION Right 12/20/2013   Procedure: RIGHT BREAST LUMPECTOMY WITH RADIOACTIVE SEED LOCALIZATION;  Surgeon: Debby Shipper, MD;  Location: [facility];  Service: General;  Laterality: Right;   CAROTID DUPLEX  2014   CATARACT EXTRACTION     both eyes   Family History  Problem Relation Age of Onset   Emphysema Mother    Emphysema Maternal Grandfather    Social History:  reports that she quit smoking about 29 years ago. Her smoking use included cigarettes. She started smoking about 69 years ago. She has a 40 pack-year smoking history. She has never used smokeless tobacco. She reports that she does not drink alcohol  and does not use drugs. Allergies: No Known Allergies Medications Prior to Admission  Medication Sig Dispense Refill   albuterol  (VENTOLIN  HFA) 108 (90 Base) MCG/ACT inhaler INHALE TWO PUFFS into THE lungs EVERY 6 HOURS AS NEEDED FOR wheezing OR SHORTNESS OF BREATH 8.5 g 6   Cholecalciferol  (VITAMIN D ) 2000 UNITS CAPS Take 2,000 Units by mouth daily.      Cyanocobalamin  (VITAMIN B 12) 500 MCG TABS Take 1 tablet by mouth daily.  Fluticasone -Umeclidin-Vilant (TRELEGY ELLIPTA ) 200-62.5-25 MCG/ACT AEPB Inhale 1 puff into the lungs daily. 60 each 11   furosemide  (LASIX ) 40 MG tablet Take 40 mg by mouth 2 (two) times daily.     hydrALAZINE  (APRESOLINE ) 25 MG tablet Take 25 mg by mouth 2 (two) times daily.     metoprolol  succinate (TOPROL -XL) 25 MG 24 hr tablet Take 25 mg by mouth at bedtime.     OXYGEN  Inhale 4 L into the lungs continuous.     rosuvastatin  (CRESTOR ) 40 MG tablet Take 40 mg by mouth  daily.     [DISCONTINUED] apixaban  (ELIQUIS ) 2.5 MG TABS tablet TAKE ONE TABLET TWICE DAILY 60 tablet 5      Home: Home Living Family/patient expects to be discharged to:: Inpatient rehab Living Arrangements: Spouse/significant other Available Help at Discharge: Family, Available 24 hours/day Type of Home: House Home Access: Stairs to enter [employer] of Steps: 4-5 Entrance Stairs-Rails: Right, Left Home Layout: One level Bathroom Shower/Tub: Engineer, manufacturing systems: Standard Bathroom Accessibility: Yes Home Equipment: Agricultural consultant (2 wheels), Information systems manager, Wheelchair - manual, BSC/3in1  Lives With: Spouse   Functional History: Prior Function Prior Level of Function : History of Falls (last six months), Needs assist Mobility Comments: ModI with RW, family assists with stair negotiation ADLs Comments: ind, sons help to drive  Functional Status:  Mobility: Bed Mobility Overal bed mobility: Needs Assistance Bed Mobility: Supine to Sit Rolling: Min assist, Used rails Sidelying to sit: Mod assist, Used rails Supine to sit: Contact guard, HOB elevated, Used rails Sit to supine: Min assist General bed mobility comments: increased time and cueing, no physical assist required. HOB elevated Transfers Overall transfer level: Needs assistance Equipment used: Rolling walker (2 wheels) Transfers: Sit to/from Stand, Bed to chair/wheelchair/BSC Sit to Stand: Mod assist Bed to/from chair/wheelchair/BSC transfer type:: Step pivot Step pivot transfers: Min assist General transfer comment: cueing for hand placement and technique, mod assist to power up and steady.  no posterior lean, discussed prior to transfer and appeared to improve. Assist required for RW mgmt Ambulation/Gait General Gait Details: deferred 2/2 symptoms Pre-gait activities: static standing marches    ADL: ADL Overall ADL's : Needs assistance/impaired Eating/Feeding: Set up, Sitting Grooming:  Set up, Wash/dry face, Sitting Upper Body Bathing: Moderate assistance Lower Body Bathing: Moderate assistance Upper Body Dressing : Moderate assistance Lower Body Dressing: Moderate assistance, Sit to/from stand Toilet Transfer: Moderate assistance, Rolling walker (2 wheels) Toilet Transfer Details (indicate cue type and reason): stand step to recliner using RW Toileting- Clothing Manipulation and Hygiene: Moderate assistance Functional mobility during ADLs: Moderate assistance, Rolling walker (2 wheels), Cueing for sequencing, Cueing for safety  Cognition: Cognition Orientation Level: Oriented X4 Cognition Arousal: Alert Behavior During Therapy: WFL for tasks assessed/performed  Physical Exam: Blood pressure (!) 123/40, pulse 69, temperature 98 F (36.7 C), resp. rate 19, height 5' 2 (1.575 m), weight 66.5 kg, SpO2 100%. Physical Exam  No results found for this or any previous visit (from the past 48 hours). No results found.    Blood pressure (!) 123/40, pulse 69, temperature 98 F (36.7 C), resp. rate 19, height 5' 2 (1.575 m), weight 66.5 kg, SpO2 100%.  Medical Problem List and Plan: 1. Functional deficits secondary to ***  -patient may *** shower  -ELOS/Goals: *** 2.  Antithrombotics: -DVT/anticoagulation:  Mechanical: Sequential compression devices, below knee Bilateral lower extremities Pharmaceutical: Eliquis   -antiplatelet therapy: Eliquis  2.5 mg twice daily 3. Pain Management: Tylenol  as needed 4. Mood/Behavior/Sleep: LCSW  to follow for evaluation and support when available.   -antipsychotic agents: [name]  - Continue melatonin 3 mg as needed at bedtime 5. Neuropsych/cognition: This patient *** capable of making decisions on *** own behalf. 6. Skin/Wound Care: Routine pressure relief measures 7. Fluids/Electrolytes/Nutrition: Monitor I&O Daily weights with follow-up labs.  - SLP following; on DYS 3  8.  A-fib: History of noncompliance with Eliquis .   -  Resumed Eliquis   - Metoprolol  25 mg for rate control, currently stable 9.  HTN: On hydralazine  25 mg twice daily and metoprolol   - Monitor blood pressures with increased activity 10.  HLD: Crestor  transition to Lipitor  80 mg 11.  AKI: Baseline creatinine 1.2-1.5. Required IV fluids.  Currently 1.51  12.  COPD:  Breztri , DuoNeb as needed  - Incentive spirometer    ***  Haillie Radu L Tahsin Benyo, NP 11/08/2023

## 2023-11-08 NOTE — Plan of Care (Signed)

## 2023-11-08 NOTE — Progress Notes (Signed)
 Occupational Therapy Treatment Patient Details Name: Courtney Cline MRN: [id number] DOB: 1933/05/10 Today's Date: 11/08/2023   History of present illness 89 y/o F presenting to ED on 9/20 as code stroke with slurred speech, RLE weakness, and fell off her cough. Found to have acute metabolic encephalopathy in the setting of sepsis 2/2 acute cystitis  Left PCA P2 segment occlusion with intermittent distal reconstitution.  PMH includes A fib, breast CA, COPD, HTN, HLD,   OT comments  Pt supine in bed and agreeable to OT session.  Completed bed mobility with min guard assist, EOB sitting during grooming with supervision.  Pt reports light headed at EOB and improved after transfer to chair, BP stable throughout session. Pt requires mod assist to transfers, min assist using RW to step to recliner but needing assist for RW mgmt and safety.  Mod assist for LB Adls and min assist for UB ADLs.  Continue to recommend >3hrs/day inpatient setting at dc, anticipate she will progress well.       If plan is discharge home, recommend the following:  A lot of help with bathing/dressing/bathroom;Assistance with cooking/housework;Direct supervision/assist for financial management;Direct supervision/assist for medications management;A lot of help with walking and/or transfers;Assist for transportation;Help with stairs or ramp for entrance   Equipment Recommendations  Other (comment) (defer)    Recommendations for Other Services      Precautions / Restrictions Precautions Precautions: Fall Recall of Precautions/Restrictions: Impaired Precaution/Restrictions Comments: watch HR and BP, 4L at home Restrictions Weight Bearing Restrictions Per Provider Order: No       Mobility Bed Mobility Overal bed mobility: Needs Assistance Bed Mobility: Supine to Sit     Supine to sit: Contact guard, HOB elevated, Used rails     General bed mobility comments: increased time and cueing, no physical assist  required. HOB elevated    Transfers Overall transfer level: Needs assistance Equipment used: Rolling walker (2 wheels) Transfers: Sit to/from Stand, Bed to chair/wheelchair/BSC Sit to Stand: Mod assist     Step pivot transfers: Min assist     General transfer comment: cueing for hand placement and technique, mod assist to power up and steady.  no posterior lean, discussed prior to transfer and appeared to improve. Assist required for RW mgmt     Balance Overall balance assessment: Needs assistance Sitting-balance support: Bilateral upper extremity supported, Feet supported Sitting balance-Leahy Scale: Fair Sitting balance - Comments: close supervision EOB   Standing balance support: Bilateral upper extremity supported, During functional activity Standing balance-Leahy Scale: Poor Standing balance comment: reliant on UE and external support                           ADL either performed or assessed with clinical judgement   ADL Overall ADL's : Needs assistance/impaired     Grooming: Set up;Wash/dry face;Sitting               Lower Body Dressing: Moderate assistance;Sit to/from stand   Toilet Transfer: Moderate assistance;Rolling walker (2 wheels) Toilet Transfer Details (indicate cue type and reason): stand step to recliner using RW         Functional mobility during ADLs: Moderate assistance;Rolling walker (2 wheels);Cueing for sequencing;Cueing for safety      Extremity/Trunk Assessment              Vision       Perception     Praxis     Communication Communication Communication: No apparent difficulties  Cognition Arousal: Alert Behavior During Therapy: WFL for tasks assessed/performed Cognition: No apparent impairments                               Following commands: Intact        Cueing   Cueing Techniques: Verbal cues  Exercises      Shoulder Instructions       General Comments BP, HR stable     Pertinent Vitals/ Pain       Pain Assessment Pain Assessment: No/denies pain Faces Pain Scale: No hurt Pain Intervention(s): Monitored during session  Home Living                                          Prior Functioning/Environment              Frequency  Min 2X/week        Progress Toward Goals  OT Goals(current goals can now be found in the care plan section)  Progress towards OT goals: Progressing toward goals  Acute Rehab OT Goals Patient Stated Goal: get better OT Goal Formulation: With patient Time For Goal Achievement: 11/19/23 Potential to Achieve Goals: Good  Plan      Co-evaluation                 AM-PAC OT 6 Clicks Daily Activity     Outcome Measure   Help from another person eating meals?: A Little Help from another person taking care of personal grooming?: A Little Help from another person toileting, which includes using toliet, bedpan, or urinal?: A Lot Help from another person bathing (including washing, rinsing, drying)?: A Lot Help from another person to put on and taking off regular upper body clothing?: A Little Help from another person to put on and taking off regular lower body clothing?: A Lot 6 Click Score: 15    End of Session Equipment Utilized During Treatment: Gait belt;Oxygen  (4l-baseline)  OT Visit Diagnosis: Unsteadiness on feet (R26.81);Other abnormalities of gait and mobility (R26.89);Muscle weakness (generalized) (M62.81);History of falling (Z91.81)   Activity Tolerance Patient tolerated treatment well   Patient Left with call bell/phone within reach;with family/visitor present;in chair;with chair alarm set   Nurse Communication Mobility status        Time: [phone] OT Time Calculation (min): 25 min  Charges: OT General Charges $OT Visit: 1 Visit OT Treatments $Self Care/Home Management : 23-37 mins  Courtney Cline, OT Acute Rehabilitation Services Office [phone] Secure Chat  Preferred    Courtney Cline 11/08/2023, 10:09 AM

## 2023-11-08 NOTE — Progress Notes (Shared)
 Courtney Sven SQUIBB, MD  Physician Physical Medicine and Rehabilitation   PMR Pre-admission    Signed   Date of Service: 11/06/2023  2:54 PM  Related encounter: ED to Hosp-Admission (Current) from 11/04/2023 in [facility]   Signed     Expand All Collapse All  PMR Admission Coordinator Pre-Admission Assessment   Patient: Courtney Cline is an 89 y.o., female MRN: [id number] DOB: 11-Sep-1933 Height: 5' 2 (157.5 cm) Weight: 64.7 kg                                                                                                                                                  Insurance Information HMO: yes    PPO:      PCP:      IPA:      80/20:      OTHER:  PRIMARY: [employer]      Policy#: [id number]      Subscriber: patient CM Name: Courtney Cline      Phone#: [phone] ext [phone]     Fax#: [phone] (from faxed approval from Courtney at [facility]) Pre-Cert#: [id number]       auth for CIR from Courtney with [employer] medicare for admit 9/24 with next review date 9/30.  Updates due to Courtney Cline (ext [phone]) at fax listed above.  Employer:  Benefits:  Phone #: n/a-online at [email or web address]     Name:  Eff. Date: 02/15/23-02/14/24     Deduct: does not have one      Out of Pocket Max: $6,750 [phone] met)      Life Max: NA  CIR: $399/day co-pay with a max co-pay of $2,793/admission      SNF: $10/day co-pay for days 1-20, $214/day co-pay for days 21-100 Outpatient: $25 co-pay/visit     Co-Pay:  Home Health: 100% coverage      Co-Pay:  DME: 80% coverage     Co-Pay: 20% co-insurance Providers: in-network SECONDARY:       Policy#:       Phone#:    Artist:       Phone#:    The Data processing manager" for patients in Inpatient Rehabilitation Facilities with attached "Privacy Act Statement-Health Care Records" was provided and verbally reviewed with: Patient and Family   Emergency Contact Information Contact Information       Name  Relation Home Work [place]     [phone]         Other Contacts       Name Relation Home Work Mobile    [place] Son     [phone]    Chaniyah, Jahr     [phone]         Current Medical History  Patient Admitting Diagnosis: CVA History of Present Illness: Pt is an 89  year old female with medical hx significant for: A-fib, breast CA, COPD, HTN, HLD. Pt presented to [facility] as a code stroke on 11/04/23 d/t slurred speech after she slid off couch. Pt also had right LE weakness. CT head negative for acute abnormalities. Urinalysis consistent with UTI. Started on Rocephin .Chest x-ray showed evidence of basilar opacity-infiltrate versus atelectasis. Also started on azithromycin . MRI revealed acute left thalamocapsular infarct. MRA showed left PCA P2 segment occlusion with intermittent distal reconstitution. Therapy evaluations completed and CIR recommended d/t pt's deficits in functional mobility.  Complete NIHSS TOTAL: 0 Glasgow Coma Scale Score: 15   Patient[facility] has been reviewed by the rehabilitation admission coordinator and physician.   Past Medical History      Past Medical History:  Diagnosis Date   Allergic rhinitis     Arthritis     Breast calcification seen on mammogram      Right breast   Breast cancer (HCC)     COPD (chronic obstructive pulmonary disease) (HCC)     GERD (gastroesophageal reflux disease)     Hyperglycemia     Hyperlipidemia     Hypertension     Low back pain     On home oxygen  therapy      all the time   Osteopenia     Peripheral edema     Shortness of breath     Subclavian artery stenosis, left (HCC)     Vitamin D  deficiency     Wears dentures      top   Wears glasses      reading          Has the patient had major surgery during 100 days prior to admission? No   Family History  family history includes Emphysema in her maternal grandfather  and mother.     Current Medications   Current Medications    Current Facility-Administered Medications:    acetaminophen  (TYLENOL ) tablet 650 mg, 650 mg, Oral, Q6H PRN, 650 mg at 11/06/23 0617 **OR** acetaminophen  (TYLENOL ) suppository 650 mg, 650 mg, Rectal, Q6H PRN, Howerter, Justin B, DO   apixaban  (ELIQUIS ) tablet 2.5 mg, 2.5 mg, Oral, BID, Howerter, Justin B, DO, 2.5 mg at 11/06/23 0907   atorvastatin  (LIPITOR ) tablet 80 mg, 80 mg, Oral, Daily, Akula, Vijaya, MD, 80 mg at 11/06/23 0907   budesonide -glycopyrrolate -formoterol  (BREZTRI ) 160-9-4.8 MCG/ACT inhaler 2 puff, 2 puff, Inhalation, BID, Georgina Basket, MD, 2 puff at 11/06/23 9180   cefTRIAXone  (ROCEPHIN ) 1 g in sodium chloride  0.9 % 100 mL IVPB, 1 g, Intravenous, Q24H, Howerter, Justin B, DO, Stopped at 11/05/23 1921   hydrALAZINE  (APRESOLINE ) tablet 25 mg, 25 mg, Oral, BID, Georgina Basket, MD, 25 mg at 11/06/23 9092   ipratropium-albuterol  (DUONEB) 0.5-2.5 (3) MG/3ML nebulizer solution 3 mL, 3 mL, Nebulization, Q6H PRN, Georgina Basket, MD   melatonin tablet 3 mg, 3 mg, Oral, QHS PRN, Howerter, Justin B, DO   metoprolol  succinate (TOPROL -XL) 24 hr tablet 25 mg, 25 mg, Oral, QHS, Opyd, Timothy S, MD, 25 mg at 11/05/23 2300   ondansetron  (ZOFRAN ) injection 4 mg, 4 mg, Intravenous, Q6H PRN, Howerter, Justin B, DO   senna (SENOKOT) tablet 8.6 mg, 1 tablet, Oral, Daily PRN, Opyd, Timothy S, MD     Patients Current Diet:  Diet Order                  DIET DYS 3 Room service appropriate? Yes with Assist; Fluid consistency: Thin  Diet effective 1000                         Precautions / Restrictions Precautions Precautions: Fall Precaution/Restrictions Comments: watch HR and low BP, 4L at home Restrictions Weight Bearing Restrictions Per Provider Order: No    Has the patient had 2 or more falls or a fall with injury in the past year?No   Prior Activity Level Limited Community (1-2x/wk): MD appointments   Prior Functional  Level Prior Function Prior Level of Function : History of Falls (last six months), Needs assist Mobility Comments: ModI with RW, family assists with stair negotiation ADLs Comments: ind, sons help to drive   Self Care: Did the patient need help bathing, dressing, using the toilet or eating?  Independent   Indoor Mobility: Did the patient need assistance with walking from room to room (with or without device)? Independent   Stairs: Did the patient need assistance with internal or external stairs (with or without device)? Independent   Functional Cognition: Did the patient need help planning regular tasks such as shopping or remembering to take medications? Independent   Patient Information Are you of Hispanic, Latino/a,or Spanish origin?: A. No, not of Hispanic, Latino/a, or Spanish origin What is your race?: A. White Do you need or want an interpreter to communicate with a doctor or health care staff?: 0. No   Patient's Response To:  Health Literacy and Transportation Is the patient able to respond to health literacy and transportation needs?: Yes Health Literacy - How often do you need to have someone help you when you read instructions, pamphlets, or other written material from your doctor or pharmacy?: Never In the past 12 months, has lack of transportation kept you from medical appointments or from getting medications?: No In the past 12 months, has lack of transportation kept you from meetings, work, or from getting things needed for daily living?: No   Home Assistive Devices / Equipment Home Equipment: Agricultural consultant (2 wheels), Information systems manager, Wheelchair - manual, BSC/3in1   Prior Device Use: Indicate devices/aids used by the patient prior to current illness, exacerbation or injury? Walker/rollator   Current Functional Level Cognition   Orientation Level: Oriented X4    Extremity Assessment (includes Sensation/Coordination)   Upper Extremity Assessment: Right hand dominant,  Generalized weakness, RUE deficits/detail RUE Deficits / Details: 4/5 globally compared to LUE  Lower Extremity Assessment: Defer to PT evaluation RLE Deficits / Details: Hip flexion 3+/5, Knee ext 4+/5, Ankle DF 3/5 RLE Sensation: WNL LLE Deficits / Details: Hip flexion 4/5, Knee ext 4+/5, Ankle DF 3+/5 LLE Sensation: WNL     ADLs   Cline ADL's : Needs assistance/impaired Eating/Feeding: Set up, Sitting Grooming: Minimal assistance, Sitting Upper Body Bathing: Moderate assistance Lower Body Bathing: Moderate assistance Upper Body Dressing : Moderate assistance Lower Body Dressing: Moderate assistance Toilet Transfer: Moderate assistance, +2 for physical assistance Toileting- Clothing Manipulation and Hygiene: Moderate assistance Functional mobility during ADLs: Moderate assistance, +2 for physical assistance, Rolling walker (2 wheels)     Mobility   Overal bed mobility: Needs Assistance Bed Mobility: Rolling, Sidelying to Sit, Sit to Supine Rolling: Min assist, Used rails Sidelying to sit: Mod assist, Used rails Sit to supine: Min assist General bed mobility comments: MinA to roll with cues to reach for bed rail, ModA to raise trunk. MinA for return to supine for slight R LE     Transfers   Cline transfer level: Needs assistance Equipment  used: Rolling walker (2 wheels) Transfers: Sit to/from Stand Sit to Stand: Mod assist, +2 physical assistance, +2 safety/equipment General transfer comment: ModAx2 for boost-up and to steady, pt started feeling lightheaded with return to sitting     Ambulation / Gait / Stairs / Wheelchair Mobility   Ambulation/Gait General Gait Details: deferred 2/2 symptoms     Posture / Balance Dynamic Sitting Balance Sitting balance - Comments: MaxA for occasional posterior and right lateral losses of balance. Able to correct with cueing however unable to maintain without MinA Balance Cline balance assessment: Needs assistance, Mild deficits  observed, not formally tested Sitting-balance support: No upper extremity supported, Feet supported Sitting balance-Leahy Scale: Poor Sitting balance - Comments: MaxA for occasional posterior and right lateral losses of balance. Able to correct with cueing however unable to maintain without MinA Postural control: Posterior lean, Right lateral lean Standing balance support: Bilateral upper extremity supported, During functional activity, Reliant on assistive device for balance Standing balance-Leahy Scale: Poor Standing balance comment: reliant on UE and external support     Special considerations/ Life events Oxygen  4L Bladder and Bowel Incontinence and Skin Erythema/Redness: sacrum        Previous Home Environment (from acute therapy documentation) Living Arrangements: Spouse/significant other  Lives With: Spouse Available Help at Discharge: Family, Available 24 hours/day Type of Home: House Home Layout: One level Home Access: Stairs to enter Entrance Stairs-Rails: Right, Left Entrance Stairs-Number of Steps: 4-5 Bathroom Shower/Tub: Engineer, manufacturing systems: Standard Bathroom Accessibility: Yes How Accessible: Accessible via walker Home Care Services: No   Discharge Living Setting Plans for Discharge Living Setting: Patient's home Type of Home at Discharge: House Discharge Home Layout: One level Discharge Home Access: Stairs to enter Entrance Stairs-Rails: Right, Left Entrance Stairs-Number of Steps: 4-5 Discharge Bathroom Shower/Tub: Tub/shower unit Discharge Bathroom Toilet: Standard Discharge Bathroom Accessibility: Yes How Accessible: Accessible via walker Does the patient have any problems obtaining your medications?: No   Social/Family/Support Systems Anticipated Caregiver: [name] (son), [name] (son) Anticipated Caregiver's Contact Information: [name]: [phone] Caregiver Availability: 24/7 Discharge Plan Discussed with Primary Caregiver: Yes Is  Caregiver In Agreement with Plan?: Yes Does Caregiver/Family have Issues with Lodging/Transportation while Pt is in Rehab?: No     Goals Patient/Family Goal for Rehab: Supervision: PT, Superivison-Min A: OT, Mod I-Superivsion: ST Expected length of stay: 10-15 days Pt/Family Agrees to Admission and willing to participate: Yes Program Orientation Provided & Reviewed with Pt/Caregiver Including Roles  & Responsibilities: Yes     Decrease burden of Care through IP rehab admission: NA     Possible need for SNF placement upon discharge:Not anticipated     Patient Condition: This patient's condition remains as documented in the consult dated 11/06/23, in which the Rehabilitation Physician determined and documented that the patient's condition is appropriate for intensive rehabilitative care in an inpatient rehabilitation facility. Will admit to inpatient rehab today.   Preadmission Screen Completed By:  Tinnie SHAUNNA Yvone Delayne, CCC-SLP, updates by Reche Lowers, PT, DPT 11/06/2023 2:54 PM ______________________________________________________________________   Discussed status with Dr. Lorilee on 11/08/23 at 2:05 PM  and received approval for admission today.   Admission Coordinator:  Tinnie SHAUNNA Yvone Delayne, time 2:05 PM Pattricia 11/08/23             Revision History

## 2023-11-08 NOTE — H&P (Signed)
 Physical Medicine and Rehabilitation Admission H&P    CC: Functional deficits due to CVA  HPI: Courtney Cline is a 89 year old female with past medical history significant for breast cancer, COPD, hypertension, and hyperlipidemia. The patient initially presented to [facility] on 11/04/2023 as a possible stroke alert due to altered mental status, confusion, and dysarthria. A CT scan of the head showed no acute process. However, an MRI revealed an acute left thalamic lacunar infarct, and an MRA of the head without contrast showed left PCA P2 segment occlusion with intermittent distal reconstitution. Neurology assessed that the etiology was embolic due to a history of atrial fibrillation and noncompliance with anticoagulation therapy. Consequently, the patient was resumed on Eliquis . A transthoracic echocardiogram showed an ejection fraction of 60-65% with grade two diastolic dysfunction, with no change compared to a previous echocardiogram.  The hospital course was complicated by acute metabolic encephalopathy secondary to the stroke and UTI secondary to acute cystitis. The patient completed a 3 day course of IV ceftriaxone . The patient was also evaluated by speech therapy for dysphagia and recommended a dysphagia 3 (D3) diet. Previously, the patient was independent with a rolling walker and family assistance for stair navigation, but currently requires minimal assistance for mobility and moderate assistance for transfers and ADLs. Therapy evaluations completed due to patient decreased functional mobility was admitted for a comprehensive rehab program.   ROS +impaired mobility and ADLs +dysphagia Cognition intact +impaired balance   Past Medical History:  Diagnosis Date   Allergic rhinitis    Arthritis    Breast calcification seen on mammogram    Right breast   Breast cancer (HCC)    COPD (chronic obstructive pulmonary disease) (HCC)    GERD (gastroesophageal reflux  disease)    Hyperglycemia    Hyperlipidemia    Hypertension    Low back pain    On home oxygen  therapy    all the time   Osteopenia    Peripheral edema    Shortness of breath    Subclavian artery stenosis, left    Vitamin D  deficiency    Wears dentures    top   Wears glasses    reading   Past Surgical History:  Procedure Laterality Date   BREAST BIOPSY     BREAST LUMPECTOMY     right 2015   BREAST LUMPECTOMY WITH RADIOACTIVE SEED LOCALIZATION Right 12/20/2013   Procedure: RIGHT BREAST LUMPECTOMY WITH RADIOACTIVE SEED LOCALIZATION;  Surgeon: Debby Shipper, MD;  Location: [facility];  Service: General;  Laterality: Right;   CAROTID DUPLEX  2014   CATARACT EXTRACTION     both eyes   Family History  Problem Relation Age of Onset   Emphysema Mother    Emphysema Maternal Grandfather    Social History:  reports that she quit smoking about 29 years ago. Her smoking use included cigarettes. She started smoking about 69 years ago. She has a 40 pack-year smoking history. She has never used smokeless tobacco. She reports that she does not drink alcohol  and does not use drugs. Allergies: No Known Allergies Medications Prior to Admission  Medication Sig Dispense Refill   albuterol  (VENTOLIN  HFA) 108 (90 Base) MCG/ACT inhaler INHALE TWO PUFFS into THE lungs EVERY 6 HOURS AS NEEDED FOR wheezing OR SHORTNESS OF BREATH 8.5 g 6   apixaban  (ELIQUIS ) 2.5 MG TABS tablet Take 1 tablet (2.5 mg total) by mouth 2 (two) times daily. 60 tablet 0   [START ON  11/09/2023] atorvastatin  (LIPITOR ) 80 MG tablet Take 1 tablet (80 mg total) by mouth daily. 30 tablet 0   Cholecalciferol  (VITAMIN D ) 2000 UNITS CAPS Take 2,000 Units by mouth daily.      Cyanocobalamin  (VITAMIN B 12) 500 MCG TABS Take 1 tablet by mouth daily.     Fluticasone -Umeclidin-Vilant (TRELEGY ELLIPTA ) 200-62.5-25 MCG/ACT AEPB Inhale 1 puff into the lungs daily. 60 each 11   furosemide  (LASIX ) 40 MG tablet Take 40 mg by mouth  2 (two) times daily.     hydrALAZINE  (APRESOLINE ) 25 MG tablet Take 25 mg by mouth 2 (two) times daily.     metoprolol  succinate (TOPROL -XL) 25 MG 24 hr tablet Take 25 mg by mouth at bedtime.     OXYGEN  Inhale 4 L into the lungs continuous.      Home: Home Living Family/patient expects to be discharged to:: Inpatient rehab Living Arrangements: Spouse/significant other Available Help at Discharge: Family, Available 24 hours/day Type of Home: House Home Access: Stairs to enter [employer] of Steps: 4-5 Entrance Stairs-Rails: Right, Left Home Layout: One level Bathroom Shower/Tub: Engineer, manufacturing systems: Standard Bathroom Accessibility: Yes Home Equipment: Agricultural consultant (2 wheels), Information systems manager, Wheelchair - manual, BSC/3in1  Lives With: Spouse   Functional History: Prior Function Prior Level of Function : History of Falls (last six months), Needs assist Mobility Comments: ModI with RW, family assists with stair negotiation ADLs Comments: ind, sons help to drive   Functional Status:  Mobility: Bed Mobility Overal bed mobility: Needs Assistance Bed Mobility: Supine to Sit Rolling: Min assist, Used rails Sidelying to sit: Mod assist, Used rails Supine to sit: Contact guard, HOB elevated, Used rails Sit to supine: Min assist General bed mobility comments: increased time and cueing, no physical assist required. HOB elevated Transfers Overall transfer level: Needs assistance Equipment used: Rolling walker (2 wheels) Transfers: Sit to/from Stand, Bed to chair/wheelchair/BSC Sit to Stand: Mod assist Bed to/from chair/wheelchair/BSC transfer type:: Step pivot Step pivot transfers: Min assist General transfer comment: cueing for hand placement and technique, mod assist to power up and steady.  no posterior lean, discussed prior to transfer and appeared to improve. Assist required for RW mgmt Ambulation/Gait General Gait Details: deferred 2/2 symptoms Pre-gait  activities: static standing marches   ADL: ADL Overall ADL's : Needs assistance/impaired Eating/Feeding: Set up, Sitting Grooming: Set up, Wash/dry face, Sitting Upper Body Bathing: Moderate assistance Lower Body Bathing: Moderate assistance Upper Body Dressing : Moderate assistance Lower Body Dressing: Moderate assistance, Sit to/from stand Toilet Transfer: Moderate assistance, Rolling walker (2 wheels) Toilet Transfer Details (indicate cue type and reason): stand step to recliner using RW Toileting- Clothing Manipulation and Hygiene: Moderate assistance Functional mobility during ADLs: Moderate assistance, Rolling walker (2 wheels), Cueing for sequencing, Cueing for safety   Cognition: Cognition Orientation Level: Oriented X4 Cognition Arousal: Alert Behavior During Therapy: WFL for tasks assessed/performed   Physical Exam: Blood pressure (!) 142/50, pulse 75, temperature 98.8 F (37.1 C), temperature source Oral, resp. rate 18, height 5' 2 (1.575 m), weight 63.6 kg, SpO2 99%. Physical Exam Gen: no distress, normal appearing HEENT: oral mucosa pink and moist, NCAT Cardio: Reg rate Chest: normal effort, normal rate of breathing Abd: soft, non-distended Ext: no edema Psych: pleasant, normal affect Skin: intact Neuro: Alert and oriented x3, BUE with 4/5 strength, BLE with 3/5 strength proximally and 4/5 strength distally, sensation intact  No results found for this or any previous visit (from the past 48 hours). No results found.  Blood pressure (!) 142/50, pulse 75, temperature 98.8 F (37.1 C), temperature source Oral, resp. rate 18, height 5' 2 (1.575 m), weight 63.6 kg, SpO2 99%.  Medical Problem List and Plan: 1. Functional deficits secondary to left thalamic capsular infarct  -patient may shower  -ELOS/Goals: 10-14 days S  Admit to CIR  2.  Antithrombotics: -DVT/anticoagulation:  Mechanical: Sequential compression devices, below knee Bilateral lower  extremities Pharmaceutical: Eliquis   -antiplatelet therapy: Eliquis  2.5 mg twice daily 3. Pain Management: Tylenol  as needed 4. Mood/Behavior/Sleep: LCSW to follow for evaluation and support when available.   -antipsychotic agents: [name]  - Continue melatonin 3 mg as needed at bedtime 5. Neuropsych/cognition: This patient is capable of making decisions on her own behalf. 6. Skin/Wound Care: Routine pressure relief measures 7. Fluids/Electrolytes/Nutrition: Monitor I&O Daily weights with follow-up labs.  - SLP following; on DYS 3   8.  A-fib: History of noncompliance with Eliquis .   - Resumed Eliquis   - continue Metoprolol  25 mg for rate control, currently stable  9.  HTN: continue hydralazine  25 mg twice daily and metoprolol   - Monitor blood pressures with increased activity  10.  HLD: Crestor  transition to Lipitor  80 mg  11.  AKI: Baseline creatinine 1.2-1.5. Required IV fluids.  Currently 1.51   12.  COPD:  continue Breztri , DuoNeb as needed  - Incentive spirometer  13. Constipation: last BM 9/22, add senna HS  I have personally performed a face to face diagnostic evaluation, including, but not limited to relevant history and physical exam findings, of this patient and developed relevant assessment and plan.  Additionally, I have reviewed and concur with the physician assistant's documentation above.  Daphne Finders, NP  Sven SHAUNNA Elks, MD 11/08/2023

## 2023-11-09 DIAGNOSIS — I5032 Chronic diastolic (congestive) heart failure: Secondary | ICD-10-CM | POA: Diagnosis not present

## 2023-11-09 DIAGNOSIS — I6349 Cerebral infarction due to embolism of other cerebral artery: Secondary | ICD-10-CM

## 2023-11-09 DIAGNOSIS — I4819 Other persistent atrial fibrillation: Secondary | ICD-10-CM | POA: Diagnosis not present

## 2023-11-09 LAB — CULTURE, BLOOD (ROUTINE X 2)
Culture: NO GROWTH
Culture: NO GROWTH

## 2023-11-09 LAB — COMPREHENSIVE METABOLIC PANEL WITH GFR
ALT: 8 U/L (ref 0–44)
AST: 15 U/L (ref 15–41)
Albumin: 2.3 g/dL — ABNORMAL LOW (ref 3.5–5.0)
Alkaline Phosphatase: 48 U/L (ref 38–126)
Anion gap: 5 (ref 5–15)
BUN: 19 mg/dL (ref 8–23)
CO2: 29 mmol/L (ref 22–32)
Calcium: 8.7 mg/dL — ABNORMAL LOW (ref 8.9–10.3)
Chloride: 106 mmol/L (ref 98–111)
Creatinine, Ser: 1.42 mg/dL — ABNORMAL HIGH (ref 0.44–1.00)
GFR, Estimated: 36 mL/min — ABNORMAL LOW (ref >60)
Glucose, Bld: 86 mg/dL (ref 70–99)
Potassium: 4.2 mmol/L (ref 3.5–5.1)
Sodium: 140 mmol/L (ref 135–145)
Total Bilirubin: 0.5 mg/dL (ref 0.0–1.2)
Total Protein: 5.6 g/dL — ABNORMAL LOW (ref 6.5–8.1)

## 2023-11-09 LAB — CBC WITH DIFFERENTIAL/PLATELET
Abs Immature Granulocytes: 0.04 K/uL (ref 0.00–0.07)
Basophils Absolute: 0 K/uL (ref 0.0–0.1)
Basophils Relative: 0 %
Eosinophils Absolute: 0.2 K/uL (ref 0.0–0.5)
Eosinophils Relative: 4 %
HCT: 30.5 % — ABNORMAL LOW (ref 36.0–46.0)
Hemoglobin: 9.1 g/dL — ABNORMAL LOW (ref 12.0–15.0)
Immature Granulocytes: 1 %
Lymphocytes Relative: 15 %
Lymphs Abs: 1 K/uL (ref 0.7–4.0)
MCH: 28.9 pg (ref 26.0–34.0)
MCHC: 29.8 g/dL — ABNORMAL LOW (ref 30.0–36.0)
MCV: 96.8 fL (ref 80.0–100.0)
Monocytes Absolute: 0.5 K/uL (ref 0.1–1.0)
Monocytes Relative: 8 %
Neutro Abs: 5 K/uL (ref 1.7–7.7)
Neutrophils Relative %: 72 %
Platelets: 283 K/uL (ref 150–400)
RBC: 3.15 MIL/uL — ABNORMAL LOW (ref 3.87–5.11)
RDW: 13.9 % (ref 11.5–15.5)
WBC: 6.8 K/uL (ref 4.0–10.5)
nRBC: 0 % (ref 0.0–0.2)

## 2023-11-09 LAB — TROPONIN I (HIGH SENSITIVITY): Troponin I (High Sensitivity): 31 ng/L — ABNORMAL HIGH (ref <18)

## 2023-11-09 MED ORDER — SODIUM CHLORIDE 0.9 % IV SOLN: Freq: Once | INTRAVENOUS | Status: AC

## 2023-11-09 MED ORDER — METOPROLOL TARTRATE 25 MG PO TABS
25.0000 mg | ORAL_TABLET | Freq: Once | ORAL | Status: AC
  Administered 2023-11-09: 25 mg via ORAL
  Filled 2023-11-09: qty 1

## 2023-11-09 MED ORDER — SODIUM CHLORIDE 0.9% IV SOLUTION
1000.0000 mL | Freq: Once | INTRAVENOUS | Status: AC
  Administered 2023-11-09: 1000 mL via INTRAVENOUS

## 2023-11-09 MED ORDER — POLYETHYLENE GLYCOL 3350 17 G PO PACK
17.0000 g | PACK | Freq: Every day | ORAL | Status: DC
  Administered 2023-11-09 – 2023-11-16 (×7): 17 g via ORAL
  Filled 2023-11-09 (×8): qty 1

## 2023-11-09 MED ORDER — METOPROLOL TARTRATE 25 MG PO TABS
25.0000 mg | ORAL_TABLET | Freq: Two times a day (BID) | ORAL | Status: DC
Start: 2023-11-09 — End: 2023-11-23
  Administered 2023-11-09 – 2023-11-23 (×27): 25 mg via ORAL
  Filled 2023-11-09 (×28): qty 1

## 2023-11-09 NOTE — Evaluation (Signed)
 Physical Therapy Assessment and Plan  Patient Details  Name: Courtney Cline MRN: [id number] Date of Birth: June 14, 1933  PT Diagnosis: Abnormal posture and Muscle weakness Rehab Potential: Good ELOS: 10-14 days   Today's Date: 11/09/2023 PT Individual Time: 9154-9078 PT Individual Time Calculation (min): 36 min    Hospital Problem: Principal Problem:   CVA (cerebral vascular accident) [facility])   Past Medical History:  Past Medical History:  Diagnosis Date   Allergic rhinitis    Arthritis    Breast calcification seen on mammogram    Right breast   Breast cancer (HCC)    COPD (chronic obstructive pulmonary disease) (HCC)    GERD (gastroesophageal reflux disease)    Hyperglycemia    Hyperlipidemia    Hypertension    Low back pain    On home oxygen  therapy    all the time   Osteopenia    Peripheral edema    Shortness of breath    Subclavian artery stenosis, left    Vitamin D  deficiency    Wears dentures    top   Wears glasses    reading   Past Surgical History:  Past Surgical History:  Procedure Laterality Date   BREAST BIOPSY     BREAST LUMPECTOMY     right 2015   BREAST LUMPECTOMY WITH RADIOACTIVE SEED LOCALIZATION Right 12/20/2013   Procedure: RIGHT BREAST LUMPECTOMY WITH RADIOACTIVE SEED LOCALIZATION;  Surgeon: Debby Shipper, MD;  Location: [facility];  Service: General;  Laterality: Right;   CAROTID DUPLEX  2014   CATARACT EXTRACTION     both eyes    Assessment & Plan Clinical Impression: Courtney Cline is a 89 year old female with past medical history significant for breast cancer, COPD, hypertension, and hyperlipidemia. The patient initially presented to [facility] on 11/04/2023 as a possible stroke alert due to altered mental status, confusion, and dysarthria. A CT scan of the head showed no acute process. However, an MRI revealed an acute left thalamic lacunar infarct, and an MRA of the head without contrast showed left PCA  P2 segment occlusion with intermittent distal reconstitution. Neurology assessed that the etiology was embolic due to a history of atrial fibrillation and noncompliance with anticoagulation therapy. Consequently, the patient was resumed on Eliquis . A transthoracic echocardiogram showed an ejection fraction of 60-65% with grade two diastolic dysfunction, with no change compared to a previous echocardiogram.  The hospital course was complicated by acute metabolic encephalopathy secondary to the stroke and UTI secondary to acute cystitis. The patient completed a 3 day course of IV ceftriaxone . The patient was also evaluated by speech therapy for dysphagia and recommended a dysphagia 3 (D3) diet. Previously, the patient was independent with a rolling walker and family assistance for stair navigation, but currently requires minimal assistance for mobility and moderate assistance for transfers and ADLs. Therapy evaluations completed due to patient decreased functional mobility was admitted for a comprehensive rehab program.  Patient currently requires mod with mobility secondary to muscle weakness and decreased coordination.  Prior to hospitalization, patient was modified independent  with mobility and lived with Spouse in a House home.  Home access is 4Stairs to enter.  Patient will benefit from skilled PT intervention to maximize safe functional mobility, minimize fall risk, and decrease caregiver burden for planned discharge home with 24 hour supervision.  Anticipate patient will benefit from follow up HH at discharge.  PT - End of Session Activity Tolerance: Tolerates 10 - 20 min activity with multiple  rests Endurance Deficit: Yes PT Assessment Rehab Potential (ACUTE/IP ONLY): Good PT Barriers to Discharge: Home environment access/layout PT Patient demonstrates impairments in the following area(s): Balance;Endurance;Motor PT Transfers Functional Problem(s): Bed Mobility;Bed to Chair;Car;Furniture PT  Locomotion Functional Problem(s): Ambulation;Stairs;Wheelchair Mobility PT Plan PT Intensity: Minimum of 1-2 x/day ,45 to 90 minutes PT Frequency: 5 out of 7 days PT Duration Estimated Length of Stay: 10-14 days PT Treatment/Interventions: Ambulation/gait training;Community reintegration;Neuromuscular re-education;Stair training;UE/LE Strength taining/ROM;Wheelchair propulsion/positioning;UE/LE Coordination activities;Therapeutic Activities;Discharge planning;Balance/vestibular training;Patient/family education;Functional mobility training;Therapeutic Exercise PT Transfers Anticipated Outcome(s): sup/mod I PT Locomotion Anticipated Outcome(s): supervision w/ LRAD PT Recommendation Follow Up Recommendations: Home health PT Patient destination: Home Equipment Recommended: To be determined Equipment Details: pt has rollator and w/c.   PT Evaluation Precautions/Restrictions Precautions Precautions: Fall Precaution/Restrictions Comments: watch HR and BP, 4L at home Restrictions Weight Bearing Restrictions Per Provider Order: No General Chart Reviewed: Yes PT Amount of Missed Time (min): 39 Minutes PT Missed Treatment Reason: Other (Comment) (increased HR to 150's, hold for NP and cardiology) Family/Caregiver Present: Yes Vital SignsTherapy Vitals Temp: 98.1 F (36.7 C) Temp Source: Oral Pulse Rate: (!) 151 Resp: 19 BP: 105/69 Patient Position (if appropriate): Lying Oxygen  Therapy SpO2: 99 % O2 Device: Nasal Cannula O2 Flow Rate (L/min): 4 L/min Pain Pain Assessment Pain Scale: 0-10 Pain Interference Pain Interference Pain Effect on Sleep: 1. Rarely or not at all (occasional HA) Pain Interference with Therapy Activities: 0. Does not apply - I have not received rehabilitationtherapy in the past 5 days;1. Rarely or not at all Pain Interference with Day-to-Day Activities: 1. Rarely or not at all Home Living/Prior Functioning Home Living Available Help at Discharge:  Family;Available 24 hours/day Type of Home: House Home Access: Stairs to enter [employer] of Steps: 4 Entrance Stairs-Rails: Right;Left Home Layout: One level Bathroom Shower/Tub: Tub/shower unit;Sponge bathes at baseline [employer]: Standard Bathroom Accessibility: Yes Additional Comments: Pt lives with her husband who is available all day, but cannot assist physically  Lives With: Spouse Prior Function Level of Independence: Requires assistive device for independence;Independent with gait;Independent with transfers;Independent with basic ADLs (3-Wheeled Rollator)  Able to Take Stairs?: Yes Vision/Perception     Cognition Overall Cognitive Status: Within Functional Limits for tasks assessed Orientation Level: Oriented X4 Awareness: Appears intact Safety/Judgment: Appears intact Sensation Sensation Light Touch: Appears Intact Coordination Gross Motor Movements are Fluid and Coordinated: No Heel Shin Test: Bilateral but smaller range Motor  Motor Motor: Abnormal postural alignment and control;Within Functional Limits Motor - Skilled Clinical Observations: but only for bed mobility up to sitting EOB.   Trunk/Postural Assessment  Cervical Assessment Cervical Assessment: Exceptions to [facility] (forward head) Thoracic Assessment Thoracic Assessment: Exceptions to [facility] (kyphotic) Lumbar Assessment Lumbar Assessment: Exceptions to [facility] (posterior pelvic tilt.) Postural Control Postural Control: Within Functional Limits  Balance Balance Balance Assessed: Yes Static Sitting Balance Static Sitting - Balance Support: Feet supported Static Sitting - Level of Assistance: 5: Stand by assistance Dynamic Sitting Balance Dynamic Sitting - Balance Support: During functional activity Dynamic Sitting - Level of Assistance: 4: Min assist;5: Stand by assistance Dynamic Sitting - Balance Activities: Forward lean/weight shifting Extremity Assessment      RLE  Assessment General Strength Comments: grossly 4+/5, hip flexion 3+/5 LLE Assessment General Strength Comments: grossly 4+/5, hip flexion 3+/5.  Care Tool Care Tool Bed Mobility Roll left and right activity   Roll left and right assist level: Minimal Assistance - Patient > 75%    Sit to lying activity   Sit  to lying assist level: Moderate Assistance - Patient 50 - 74%    Lying to sitting on side of bed activity   Lying to sitting on side of bed assist level: the ability to move from lying on the back to sitting on the side of the bed with no back support.: Moderate Assistance - Patient 50 - 74%     Care Tool Transfers Sit to stand transfer Sit to stand activity did not occur: Safety/medical concerns      Chair/bed transfer Chair/bed transfer activity did not occur: Safety/medical concerns      Licensed conveyancer transfer activity did not occur: Safety/medical concerns        Care Tool Locomotion Ambulation Ambulation activity did not occur: Safety/medical concerns        Walk 10 feet activity Walk 10 feet activity did not occur: Safety/medical concerns       Walk 50 feet with 2 turns activity Walk 50 feet with 2 turns activity did not occur: Safety/medical concerns      Walk 150 feet activity Walk 150 feet activity did not occur: Safety/medical concerns      Walk 10 feet on uneven surfaces activity Walk 10 feet on uneven surfaces activity did not occur: Safety/medical concerns      Stairs Stair activity did not occur: Safety/medical concerns        Walk up/down 1 step activity Walk up/down 1 step or curb (drop down) activity did not occur: Safety/medical concerns      Walk up/down 4 steps activity Walk up/down 4 steps activity did not occur: Safety/medical concerns      Walk up/down 12 steps activity Walk up/down 12 steps activity did not occur: Safety/medical concerns      Pick up small objects from floor Pick up small object from the floor (from standing  position) activity did not occur: Safety/medical concerns      Wheelchair Is the patient using a wheelchair?: Yes Type of Wheelchair: Manual Wheelchair activity did not occur: Safety/medical concerns      Wheel 50 feet with 2 turns activity Wheelchair 50 feet with 2 turns activity did not occur: Safety/medical concerns    Wheel 150 feet activity Wheelchair 150 feet activity did not occur: Safety/medical concerns      Refer to Care Plan for Long Term Goals  SHORT TERM GOAL WEEK 1 PT Short Term Goal 1 (Week 1): Pt will transfer sup to sit w/ min A consistently. PT Short Term Goal 2 (Week 1): Pt will transfer st to stand w/ min A consistently. PT Short Term Goal 3 (Week 1): PT to assess gait PT Short Term Goal 4 (Week 1): Pt to assess stairs.  Recommendations for other services: None   Skilled Therapeutic Intervention Evaluation completed (see details above and below) with education on PT POC and goals and individual treatment initiated with focus on  endurance, transfers, balance, assess gait, stairs. Pt presents supine in bed and agreeable to therapy.  Pt rolls side to side from elevated head 2/2 inability to breathe when flat.  Pt has flat bed at home w/ 4 LPM.  Pt transfers L sidelying to sit w/ light mod A and cues.  Pt sat EOB w/o assist needed for MMT.  Pt c/o headache and neck pain.  HR monitored and 157.  Nursing present and performs manual pulse, but variable.  Pt transferred sit to supine w/ mod A and vitals completed by RN.  BP 86/58.  Pt remained semi-reclined for  NP assessment w/ bed alarm on and son present.  Missed time of 39.     Mobility Bed Mobility Bed Mobility: Rolling Right;Rolling Left;Left Sidelying to Sit Rolling Right: Minimal Assistance - Patient > 75% Rolling Left: Minimal Assistance - Patient > 75% Left Sidelying to Sit: Moderate Assistance - Patient 50-74%;Minimal Assistance - Patient >75% Transfers Transfers:  (inc HR to 157 and BP of 86/58 in  semi-reclined directly after sitting EOB.) Locomotion  Gait Ambulation: No Gait Gait: No Wheelchair Mobility Wheelchair Mobility: No   Discharge Criteria: Patient will be discharged from PT if patient refuses treatment 3 consecutive times without medical reason, if treatment goals not met, if there is a change in medical status, if patient makes no progress towards goals or if patient is discharged from hospital.  The above assessment, treatment plan, treatment alternatives and goals were discussed and mutually agreed upon: by patient and by family  Reyes SHAUNNA Sierra 11/09/2023, 12:08 PM

## 2023-11-09 NOTE — Progress Notes (Signed)
 PROGRESS NOTE   Subjective/Complaints:  No events overnight. A.m. tachycardia with therapies, into 150s, along with hypotension 80s over 50s.  EKG significant for A-fib with RVR.  Patient asymptomatic A.m. labs significant for improving creatinine, albumin 2.3, hemoglobin slight downtrend at 9.1.  Continent of bladder.  No bowel movement overnight.  ROS: Denies fevers, chills, N/V, abdominal pain, constipation, diarrhea, SOB, cough, chest pain, new weakness or paraesthesias.    Objective:   No results found. Recent Labs    11/09/23 0514  WBC 6.8  HGB 9.1*  HCT 30.5*  PLT 283   Recent Labs    11/09/23 0514  NA 140  K 4.2  CL 106  CO2 29  GLUCOSE 86  BUN 19  CREATININE 1.42*  CALCIUM  8.7*    Intake/Output Summary (Last 24 hours) at 11/09/2023 1010 Last data filed at 11/09/2023 0800 Gross per 24 hour  Intake 118 ml  Output --  Net 118 ml        Physical Exam: Vital Signs Blood pressure 105/69, pulse (!) 151, temperature 98.1 F (36.7 C), temperature source Oral, resp. rate 19, height 5' 2 (1.575 m), weight 61.8 kg, SpO2 99%. Constitutional: No apparent distress. Appropriate appearance for age.  HENT: No JVD. Neck Supple. Trachea midline. Atraumatic, normocephalic. Eyes: PERRLA. EOMI. Visual fields grossly intact.  Cardiovascular: RRR, no murmurs/rub/gallops. No Edema. Peripheral pulses 2+  Respiratory: CTAB. No rales, rhonchi, or wheezing. On RA.  On 2 L nasal cannula Abdomen: + bowel sounds, hypoactive. No distention or tenderness.  GU: Not examined. Skin: C/D/I. No apparent lesions.  Multiple bruises on bilateral upper extremities.  Skin turgor very poor. MSK:      No apparent deformity.   Neurologic exam:  Cognition: AAO to person, place, time  Language: Fluent, No substitutions or neoglisms. No dysarthria.  Memory: Mild deficits Insight: Good  insight into current condition.  Mood: Pleasant  affect, appropriate mood.  Sensation: Equal and intact in BL UE and Les.  Reflexes: 2+ in BL UE and LEs. Negative Hoffman's and babinski signs bilaterally.  CN: 2-12 grossly intact.  Coordination: No apparent tremors. No ataxia on FTN, HTS bilaterally.  Spasticity: MAS 0 in all extremities.         Assessment/Plan: 1. Functional deficits which require 3+ hours per day of interdisciplinary therapy in a comprehensive inpatient rehab setting. Physiatrist is providing close team supervision and 24 hour management of active medical problems listed below. Physiatrist and rehab team continue to assess barriers to discharge/monitor patient progress toward functional and medical goals  Care Tool:  Bathing              Bathing assist       Upper Body Dressing/Undressing Upper body dressing        Upper body assist      Lower Body Dressing/Undressing Lower body dressing            Lower body assist       Toileting Toileting    Toileting assist       Transfers Chair/bed transfer  Transfers assist  Chair/bed transfer activity did not occur: Safety/medical concerns  Locomotion Ambulation   Ambulation assist   Ambulation activity did not occur: Safety/medical concerns          Walk 10 feet activity   Assist  Walk 10 feet activity did not occur: Safety/medical concerns        Walk 50 feet activity   Assist Walk 50 feet with 2 turns activity did not occur: Safety/medical concerns         Walk 150 feet activity   Assist Walk 150 feet activity did not occur: Safety/medical concerns         Walk 10 feet on uneven surface  activity   Assist Walk 10 feet on uneven surfaces activity did not occur: Safety/medical concerns         Wheelchair     Assist Is the patient using a wheelchair?: Yes Type of Wheelchair: Manual Wheelchair activity did not occur: Safety/medical concerns         Wheelchair 50 feet with 2 turns  activity    Assist    Wheelchair 50 feet with 2 turns activity did not occur: Safety/medical concerns       Wheelchair 150 feet activity     Assist  Wheelchair 150 feet activity did not occur: Safety/medical concerns       Blood pressure 105/69, pulse (!) 151, temperature 98.1 F (36.7 C), temperature source Oral, resp. rate 19, height 5' 2 (1.575 m), weight 61.8 kg, SpO2 99%.  Medical Problem List and Plan: 1. Functional deficits secondary to left thalamic capsular infarct             -patient may shower             -ELOS/Goals: 10-14 days S             -Stable to continue CIR   2.  Antithrombotics: -DVT/anticoagulation:  Mechanical: Sequential compression devices, below knee Bilateral lower extremities Pharmaceutical: Eliquis              -antiplatelet therapy: Eliquis  2.5 mg twice daily 3. Pain Management: Tylenol  as needed 4. Mood/Behavior/Sleep: LCSW to follow for evaluation and support when available.              -antipsychotic agents: [name]             - Continue melatonin 3 mg as needed at bedtime 5. Neuropsych/cognition: This patient is capable of making decisions on her own behalf. 6. Skin/Wound Care: Routine pressure relief measures 7. Fluids/Electrolytes/Nutrition: Monitor I&O Daily weights with follow-up labs.             - SLP following; on DYS 3    8.  A-fib: History of noncompliance with Eliquis .              - Resumed Eliquis              - continue Metoprolol  25 mg nightly for rate control, currently stable  - 9/25: A-fib with RVR into the 150s with mobilization this a.m.  Got additional dose of metoprolol  this a.m. for coverage.  Cardiology consulted, appreciate recommendations   9.  HTN: continue hydralazine  25 mg twice daily and metoprolol              - Monitor blood pressures with increased activity  - 9-25: Hypotensive into the 80s with mobilization; hold hydralazine , continue metoprolol  for rate control, will give 1 L IV fluid today.   Cardiology consult pending.      11/09/2023    9:45 AM 11/09/2023  9:22 AM 11/09/2023    5:19 AM  Vitals with BMI  Weight   136 lbs 4 oz  BMI   24.91  Systolic 105 86 104  Diastolic 69 58 63  Pulse 151 153 80    10.  HLD: Crestor  transition to Lipitor  80 mg   11.  AKI: Baseline creatinine 1.2-1.5. Required IV fluids.  Currently 1.51    - 9-25: Slow improvement, creatinine 1.4.  IV fluids today as above.  12.  COPD:  continue Breztri , DuoNeb as needed             - Incentive spirometer   13. Constipation: last BM 9/22, add senna HS  - 9/25: add daily miralax      LOS: 1 days A FACE TO FACE EVALUATION WAS PERFORMED  Joesph JAYSON Likes 11/09/2023, 10:10 AM

## 2023-11-09 NOTE — Progress Notes (Addendum)
   11/09/23 9077  Assess: MEWS Score  Temp 98 F (36.7 C)  BP (!) 86/58  MAP (mmHg) 65  Pulse Rate (!) 153  Resp 18  SpO2 97 %  O2 Device Nasal Cannula  O2 Flow Rate (L/min) 4 L/min  Assess: MEWS Score  MEWS Temp 0  MEWS Systolic 1  MEWS Pulse 3  MEWS RR 0  MEWS LOC 0  MEWS Score 4  MEWS Score Color Red  Assess: if the MEWS score is Yellow or Red  Were vital signs accurate and taken at a resting state? Yes  Does the patient meet 2 or more of the SIRS criteria? No  MEWS guidelines implemented  Yes, red  Treat  MEWS Interventions Considered administering scheduled or prn medications/treatments as ordered  Take Vital Signs  Increase Vital Sign Frequency  Red: Q1hr x2, continue Q4hrs until patient remains green for 12hrs  Escalate  MEWS: Escalate Red: Discuss with charge nurse and notify provider. Consider notifying RRT. If remains red for 2 hours consider need for higher level of care  Notify: Charge Nurse/RN  Name of Charge Nurse/RN Notified Sigrid Essex  Provider Notification  Provider Name/Title Brandi, NP  Date Provider Notified 11/09/23  Time Provider Notified [phone]  Method of Notification Call  Notification Reason Change in status  Provider response En route;See new orders  Date of Provider Response 11/09/23  Time of Provider Response 0930  Assess: SIRS CRITERIA  SIRS Temperature  0  SIRS Respirations  0  SIRS Pulse 1  SIRS WBC 0  SIRS Score Sum  1    EKG ordered and one time dose of Metoprolol (lopressor ) 25mg  administered , 0-10 pain , 0 noted .

## 2023-11-09 NOTE — Consult Note (Addendum)
 Cardiology Consultation  Patient ID: Courtney Cline MRN: [id number]; DOB: 1933-04-05  Admit date: 11/08/2023 Date of Consult: 11/09/2023  PCP:  Courtney Charm, MD   [facility] Providers Cardiologist:  Courtney Lesches, MD     Patient Profile: Courtney Cline is a 89 y.o. female with a hx of hypertension, hyperlipidemia, chronic HFpEF, persistent atrial fibrillation, nonrheumatic aortic stenosis, left subclavian artery stenosis, history of breast cancer, COPD wearing 4 L oxygen  at baseline, GERD, who is being seen 11/09/2023 for the evaluation of A-fib RVR at the request of Dr. Emeline.  History of Present Illness: Courtney Cline has past medical history stated above.  She originally presented to [facility] emergency department on 11/04/2023 with altered mental status, confusion, dysarthria, concern for stroke.  She was seen by neurology, who believed it to be metabolic encephalopathy in the setting of sepsis secondary to acute cystitis as opposed to stroke/TIA after reviewing CT.  The patient then underwent a brain MRI which showed an acute ischemic infarct.  The etiology is presumed to be embolic in the setting of noncompliance with anticoagulation for A-fib.  Patient reported that she is post be taking Eliquis  2.5 mg twice daily but she had been typically taking it once daily as she has had some issues with nosebleeds in the past.  She was admitted as an inpatient from 9/20-9/24/2025, was then discharged to [facility] inpatient rehab.  It was noted that in the morning of 11/09/2023 the nurse reported the patient's HR was sustained in the 150s while the patient was sitting on the side of the bed.  Patient had denied any chest pain, shortness of breath but did report a headache.  EKG was ordered which showed A-fib, HR 140s. Patient does have a known history of permanent A-fib, and appeared to be in A-fib most of this admission, though typically rate controlled before this event.   She was typically on Toprol  25 mg nightly but was given an additional dose of Lopressor  25 mg at the time of this event.   She is a Dr. Lesches patient, last seen in clinic 06/28/2023 for follow-up.  At this appointment she was noted to be stable, trace lower extremity edema, on chronic oxygen  for COPD.  She was noted to be in sinus rhythm at this appointment.  Her medication list at this time included: Lasix  20 mg twice daily, Lopressor  25 mg twice daily, Crestor  40 mg daily, Eliquis  2.5 mg twice daily.  Overall she was being managed fairly conservatively.  On oral diuretics for her bilateral LE edema.  And she was doing well in sinus rhythm, HR 64 at this appointment.  After speaking with the patient and family present in room, she agrees with the history as stated above. She wears 4 L oxygen  constantly at baseline. She reports that she has worn this for 10+ years. I rechecked vitals while I was in the room, HR 134 BP 96/73. She reports that she is not currently feeling any chest pain, shortness of breath or palpitations. She is typically unaware of her symptoms. When reviewing her labs, her hemoglobin has dropped from 10.2 ? 9.1 this admission but does typically hang around 8-9 chronically. Her creatinine was 1.90 on presentation down to 1.42 today. Noted to be around 1.2-1.3 baseline. She did report taking her PO Lasix  BID as directed prior to admission. It does not appear that she was receiving any Lasix  while admitted.    Past Medical History:  Diagnosis Date   Allergic  rhinitis    Arthritis    Breast calcification seen on mammogram    Right breast   Breast cancer (HCC)    COPD (chronic obstructive pulmonary disease) (HCC)    GERD (gastroesophageal reflux disease)    Hyperglycemia    Hyperlipidemia    Hypertension    Low back pain    On home oxygen  therapy    all the time   Osteopenia    Peripheral edema    Shortness of breath    Subclavian artery stenosis, left    Vitamin D  deficiency     Wears dentures    top   Wears glasses    reading   Past Surgical History:  Procedure Laterality Date   BREAST BIOPSY     BREAST LUMPECTOMY     right 2015   BREAST LUMPECTOMY WITH RADIOACTIVE SEED LOCALIZATION Right 12/20/2013   Procedure: RIGHT BREAST LUMPECTOMY WITH RADIOACTIVE SEED LOCALIZATION;  Surgeon: Courtney Shipper, MD;  Location: [facility];  Service: General;  Laterality: Right;   CAROTID DUPLEX  2014   CATARACT EXTRACTION     both eyes    Home Medications:  Prior to Admission medications   Medication Sig Start Date End Date Taking? Authorizing Provider  albuterol  (VENTOLIN  HFA) 108 (90 Base) MCG/ACT inhaler INHALE TWO PUFFS into THE lungs EVERY 6 HOURS AS NEEDED FOR wheezing OR SHORTNESS OF BREATH 10/24/23   Courtney Acquanetta Bradley, MD  apixaban  (ELIQUIS ) 2.5 MG TABS tablet Take 1 tablet (2.5 mg total) by mouth 2 (two) times daily. 11/08/23   Courtney Ranks, MD  atorvastatin  (LIPITOR ) 80 MG tablet Take 1 tablet (80 mg total) by mouth daily. 11/09/23   Courtney Ranks, MD  Cholecalciferol  (VITAMIN D ) 2000 UNITS CAPS Take 2,000 Units by mouth daily.     [provider]  Cyanocobalamin  (VITAMIN B 12) 500 MCG TABS Take 1 tablet by mouth daily.    [provider]  Fluticasone -Umeclidin-Vilant (TRELEGY ELLIPTA ) 200-62.5-25 MCG/ACT AEPB Inhale 1 puff into the lungs daily. 04/27/23   Courtney Acquanetta Bradley, MD  furosemide  (LASIX ) 40 MG tablet Take 40 mg by mouth 2 (two) times daily.    [provider]  hydrALAZINE  (APRESOLINE ) 25 MG tablet Take 25 mg by mouth 2 (two) times daily. 10/23/23   [provider]  metoprolol  succinate (TOPROL -XL) 25 MG 24 hr tablet Take 25 mg by mouth at bedtime. 10/23/23   [provider]  OXYGEN  Inhale 4 L into the lungs continuous.    [provider]   Scheduled Meds:  apixaban   2.5 mg Oral BID   atorvastatin   80 mg Oral Daily   budesonide -glycopyrrolate -formoterol   2 puff Inhalation BID   metoprolol   tartrate  25 mg Oral BID   senna  1 tablet Oral QHS   Continuous Infusions:  PRN Meds: acetaminophen , alum & mag hydroxide-simeth, bisacodyl , guaiFENesin -dextromethorphan, ipratropium-albuterol , melatonin, prochlorperazine  **OR** prochlorperazine  **OR** prochlorperazine , sodium phosphate, traZODone   Allergies:   No Known Allergies  Social History:   Social History   Socioeconomic History   Marital status: Married    Spouse name: [name]   Number of children: 2   Years of education: Not on file   Highest education level: Not on file  Occupational History   Occupation: retired    Comment: Programmer, applications  Tobacco Use   Smoking status: Former    Current packs/day: 0.00    Average packs/day: 1 pack/day for 40.0 years (40.0 ttl pk-yrs)    Types: Cigarettes  Start date: 02/14/1954    Quit date: 02/14/1994    Years since quitting: 29.7   Smokeless tobacco: Never  Vaping Use   Vaping status: Never Used  Substance and Sexual Activity   Alcohol  use: No   Drug use: No   Sexual activity: Not on file  Other Topics Concern   Not on file  Social History Narrative   Not on file   Social Drivers of Health   Financial Resource Strain: Not on file  Food Insecurity: No Food Insecurity (11/05/2023)   Hunger Vital Sign    Worried About Running Out of Food in the Last Year: Never true    Ran Out of Food in the Last Year: Never true  Transportation Needs: No Transportation Needs (11/05/2023)   PRAPARE - Administrator, Civil Service (Medical): No    Lack of Transportation (Non-Medical): No  Physical Activity: Not on file  Stress: Not on file  Social Connections: Socially Isolated (11/05/2023)   Social Connection and Isolation Panel    Frequency of Communication with Friends and Family: Once a week    Frequency of Social Gatherings with Friends and Family: Once a week    Attends Religious Services: Never    Database administrator or Organizations: No    Attends Tax inspector Meetings: Never    Marital Status: Married  Catering manager Violence: Not At Risk (11/05/2023)   Humiliation, Afraid, Rape, and Kick questionnaire    Fear of Current or Ex-Partner: No    Emotionally Abused: No    Physically Abused: No    Sexually Abused: No    Family History:   Family History  Problem Relation Age of Onset   Emphysema Mother    Emphysema Maternal Grandfather     ROS:  Please see the history of present illness.  All other ROS reviewed and negative.     Physical Exam/Data: Vitals:   11/09/23 0938 11/09/23 0945 11/09/23 1055 11/09/23 1254  BP: 105/69 105/69 (!) 99/59 96/73  Pulse: (!) 151 (!) 151 (!) 113 (!) 134  Resp: 19 19 18    Temp: 98.1 F (36.7 C) 98.1 F (36.7 C) 98.4 F (36.9 C)   TempSrc: Oral Oral Oral   SpO2: 99% 99% 99%   Weight:      Height:        Intake/Output Summary (Last 24 hours) at 11/09/2023 1319 Last data filed at 11/09/2023 0800 Gross per 24 hour  Intake 118 ml  Output --  Net 118 ml      11/09/2023    5:19 AM 11/08/2023    5:13 PM 11/08/2023    4:22 AM  Last 3 Weights  Weight (lbs) 136 lb 3.9 oz 140 lb 3.4 oz 146 lb 9.6 oz  Weight (kg) 61.8 kg 63.6 kg 66.497 kg     Body mass index is 24.92 kg/m.   General:  in no acute distress, on chronic 4 L oxygen  via [place] HEENT: normal Neck: no JVD Vascular: Distal pulses 2+ bilaterally Cardiac:  normal S1, S2; RRR; no murmur  Lungs:  clear to auscultation bilaterally Abd: soft, nontender, no hepatomegaly  Ext: no edema Musculoskeletal:  No deformities Skin: warm and dry  Neuro: no focal abnormalities noted Psych:  Normal affect   EKG:  The EKG was personally reviewed and demonstrates:  A-Fib, HR 140  Telemetry:  Telemetry was personally reviewed and demonstrates:  [name]  Relevant CV Studies:  Echocardiogram, 11/06/2023 Left ventricular ejection fraction,  by estimation, is 60 to 65% . The left ventricle has normal function. The left ventricle has no regional wall  motion abnormalities. Left ventricular diastolic parameters are consistent with Grade II diastolic dysfunction ( pseudonormalization) .  Right ventricular systolic function is normal. The right ventricular size is normal. There is severely elevated pulmonary artery systolic pressure. The estimated right ventricular systolic pressure is 67. 0 mmHg.  Left atrial size was moderately dilated.  Negative bubble study, no evidence for PFO or ASD.  MVA 1. 08 cm^ 2 by VTI. The mitral valve is degenerative. Trivial mitral valve regurgitation. Severe mitral stenosis. The mean mitral valve gradient is 9. 0 mmHg. Moderate to severe mitral annular calcification.  Tricuspid valve regurgitation is moderate.  The aortic valve is tricuspid. Aortic valve regurgitation is trivial. Moderate aortic valve stenosis. Aortic valve area, by VTI measures 1. 21 cm . Aortic valve mean gradient measures 17. 0 mmHg.  The inferior vena cava is normal in size with greater than 50% respiratory variability, suggesting right atrial pressure of 3 mmHg.  Carotid ultrasound, 11/05/2023 Right Carotid:  Velocities in the right ICA are consistent with a 1- 39% stenosis. Left Carotid:  Velocities in the left ICA are consistent with a 1- 39% stenosis. Vertebrals: Right vertebral artery demonstrates antegrade flow. Left vertebral artery demonstrates retrograde flow.  Subclavians: Normal flow hemodynamics were seen in bilateral subclavian arteries.  Laboratory Data: High Sensitivity Troponin:   Recent Labs  Lab 11/09/23 0949  TROPONINIHS 31*     Chemistry Recent Labs  Lab 11/04/23 0435 11/06/23 0141 11/09/23 0514  NA 139 139 140  K 4.0 4.4 4.2  CL 103 103 106  CO2 24 25 29   GLUCOSE 134* 95 86  BUN 21 19 19   CREATININE 1.66* 1.51* 1.42*  CALCIUM  8.6* 8.6* 8.7*  MG 1.8  --   --   GFRNONAA 29* 33* 36*  ANIONGAP 12 11 5     Recent Labs  Lab 11/04/23 0147 11/04/23 0435 11/09/23 0514  PROT 6.8 6.6 5.6*  ALBUMIN 3.1*  2.9* 2.3*  AST 16 19 15   ALT 8 6 8   ALKPHOS 61 58 48  BILITOT 0.3 0.8 0.5   Lipids  Recent Labs  Lab 11/06/23 0141  CHOL 99  TRIG 60  HDL 46  LDLCALC 41  CHOLHDL 2.2    Hematology Recent Labs  Lab 11/04/23 0147 11/04/23 0435 11/09/23 0514  WBC 9.9 8.1 6.8  RBC 3.31* 3.23* 3.15*  HGB 9.7* 9.3* 9.1*  HCT 31.9* 30.9* 30.5*  MCV 96.4 95.7 96.8  MCH 29.3 28.8 28.9  MCHC 30.4 30.1 29.8*  RDW 13.7 13.5 13.9  PLT 295 266 283   Thyroid  No results for input(s): TSH, FREET4 in the last 168 hours.  BNPNo results for input(s): BNP, PROBNP in the last 168 hours.  DDimer No results for input(s): DDIMER in the last 168 hours.  Radiology/Studies:  ECHOCARDIOGRAM COMPLETE BUBBLE STUDY Result Date: 11/06/2023    ECHOCARDIOGRAM REPORT   Patient Name:   Jolene Guyett Date of Exam: 11/06/2023 Medical Rec #:  [id number]              Height:       62.0 in Accession #:    [id number]             Weight:       142.6 lb Date of Birth:  03-09-1933               BSA:  1.656 m Patient Age:    11 years               BP:           110/79 mmHg Patient Gender: F                      HR:           89 bpm. Exam Location:  Inpatient Procedure: 2D Echo, Cardiac Doppler, Color Doppler and Saline Contrast Bubble            Study (Both Spectral and Color Flow Doppler were utilized during            procedure). Indications:    Stroke I63.9  History:        Patient has prior history of Echocardiogram examinations, most                 recent 02/16/2021. CHF, Previous Myocardial Infarction, COPD and                 CKD, stage 3, Arrythmias:Atrial Fibrillation; Risk                 Factors:Hypertension and Dyslipidemia.  Sonographer:    Thea Norlander RCS Referring Phys: MARSHA ADA IMPRESSIONS  1. Left ventricular ejection fraction, by estimation, is 60 to 65%. The left ventricle has normal function. The left ventricle has no regional wall motion abnormalities. Left ventricular diastolic parameters  are consistent with Grade II diastolic dysfunction (pseudonormalization).  2. Right ventricular systolic function is normal. The right ventricular size is normal. There is severely elevated pulmonary artery systolic pressure. The estimated right ventricular systolic pressure is 67.0 mmHg.  3. Left atrial size was moderately dilated.  4. Negative bubble study, no evidence for PFO or ASD.  5. MVA 1.08 cm^2 by VTI. The mitral valve is degenerative. Trivial mitral valve regurgitation. Severe mitral stenosis. The mean mitral valve gradient is 9.0 mmHg. Moderate to severe mitral annular calcification.  6. Tricuspid valve regurgitation is moderate.  7. The aortic valve is tricuspid. Aortic valve regurgitation is trivial. Moderate aortic valve stenosis. Aortic valve area, by VTI measures 1.21 cm. Aortic valve mean gradient measures 17.0 mmHg.  8. The inferior vena cava is normal in size with greater than 50% respiratory variability, suggesting right atrial pressure of 3 mmHg. FINDINGS  Left Ventricle: Left ventricular ejection fraction, by estimation, is 60 to 65%. The left ventricle has normal function. The left ventricle has no regional wall motion abnormalities. The left ventricular internal cavity size was normal in size. There is  no left ventricular hypertrophy. Left ventricular diastolic parameters are consistent with Grade II diastolic dysfunction (pseudonormalization). Right Ventricle: The right ventricular size is normal. No increase in right ventricular wall thickness. Right ventricular systolic function is normal. There is severely elevated pulmonary artery systolic pressure. The tricuspid regurgitant velocity is 4.00 m/s, and with an assumed right atrial pressure of 3 mmHg, the estimated right ventricular systolic pressure is 67.0 mmHg. Left Atrium: Left atrial size was moderately dilated. Right Atrium: Right atrial size was normal in size. Pericardium: There is no evidence of pericardial effusion. Mitral  Valve: MVA 1.08 cm^2 by VTI. The mitral valve is degenerative in appearance. There is severe calcification of the mitral valve leaflet(s). Moderate to severe mitral annular calcification. Trivial mitral valve regurgitation. Severe mitral valve stenosis. MV peak gradient, 16.5 mmHg. The mean mitral valve gradient is 9.0 mmHg. Tricuspid Valve: The tricuspid valve  is normal in structure. Tricuspid valve regurgitation is moderate. Aortic Valve: The aortic valve is tricuspid. Aortic valve regurgitation is trivial. Moderate aortic stenosis is present. Aortic valve mean gradient measures 17.0 mmHg. Aortic valve peak gradient measures 24.9 mmHg. Aortic valve area, by VTI measures 1.21  cm. Pulmonic Valve: The pulmonic valve was normal in structure. Pulmonic valve regurgitation is not visualized. Aorta: The aortic root is normal in size and structure. Venous: The inferior vena cava is normal in size with greater than 50% respiratory variability, suggesting right atrial pressure of 3 mmHg. IAS/Shunts: Negative bubble study, no evidence for PFO or ASD. Agitated saline contrast was given intravenously to evaluate for intracardiac shunting.  LEFT VENTRICLE PLAX 2D LVIDd:         4.30 cm   Diastology LVIDs:         2.70 cm   LV e' medial:    11.20 cm/s LV PW:         0.70 cm   LV E/e' medial:  16.9 LV IVS:        0.80 cm   LV e' lateral:   10.30 cm/s LVOT diam:     1.90 cm   LV E/e' lateral: 18.3 LV SV:         54 LV SV Index:   33 LVOT Area:     2.84 cm  RIGHT VENTRICLE             IVC RV S prime:     20.50 cm/s  IVC diam: 1.30 cm TAPSE (M-mode): 1.8 cm LEFT ATRIUM             Index        RIGHT ATRIUM           Index LA diam:        3.90 cm 2.36 cm/m   RA Area:     15.70 cm LA Vol (A2C):   57.2 ml 34.54 ml/m  RA Volume:   37.45 ml  22.62 ml/m LA Vol (A4C):   62.0 ml 37.44 ml/m LA Biplane Vol: 60.3 ml 36.42 ml/m  AORTIC VALVE AV Area (Vmax):    1.27 cm AV Area (Vmean):   1.23 cm AV Area (VTI):     1.21 cm AV Vmax:            249.67 cm/s AV Vmean:          176.000 cm/s AV VTI:            0.451 m AV Peak Grad:      24.9 mmHg AV Mean Grad:      17.0 mmHg LVOT Vmax:         112.00 cm/s LVOT Vmean:        76.100 cm/s LVOT VTI:          0.192 m LVOT/AV VTI ratio: 0.43  AORTA Ao Root diam: 3.30 cm Ao Asc diam:  3.40 cm MITRAL VALVE                TRICUSPID VALVE MV Area (PHT): 3.31 cm     TR Peak grad:   64.0 mmHg MV Area VTI:   1.08 cm     TR Vmax:        400.00 cm/s MV Peak grad:  16.5 mmHg MV Mean grad:  9.0 mmHg     SHUNTS MV Vmax:       2.03 m/s     Systemic VTI:  0.19 m MV Vmean:  137.5 cm/s   Systemic Diam: 1.90 cm MV Decel Time: 229 msec MV E velocity: 189.00 cm/s MV A velocity: 135.00 cm/s MV E/A ratio:  1.40 Dalton McleanMD Electronically signed by Ezra Kanner Signature Date/Time: 11/06/2023/6:21:01 PM    Final    Assessment and Plan:  Persistent atrial fibrillation Episode of RVR Patient presented to ED with strokelike symptoms Brain MRI confirmed acute ischemic infarction Etiology suspected to be embolic in the setting of noncompliance with Eliquis  Patient discharged to CIR 11/08/2023 Noted to have rapid HR, sustained 150s with mobilization this morning Echo this admission: Moderately dilated LA Currently on Eliquis  2.5 mg twice daily Given additional dose of Lopressor  25 mg at 9:44 AM Fairly limited options given hypotension and co-morbidities  Will adjust beta-blocker dose, Lopressor  25 mg BID -- continue to closely monitor HR and BP   Chronic HFpEF Hypertension Episodes of hypotension Home meds: hydralazine  25 mg twice daily, Toprol  25 mg daily, Lasix  Was receiving hydralazine  25 mg twice daily, held in the setting of hypotension Most recent BP 96/73, as low as 86/58 Echo this admission: EF 60 to 65%, G2 DD, normal RV function, normal IVC Appears euvolemic on exam, no LE edema  Does not appear to  have received any diuresis during this admission Continue to hold Lasix , hydralazine  --  monitor BP Adjust beta-blocker as above  Hyperlipidemia  11/06/2023: HDL 46; LDL Cholesterol 41 11/09/2023: ALT 8  Continue Lipitor  80 mg daily  Per primary Acute ischemic infarction Dysphagia AKI UTI  Risk Assessment/Risk Scores:      [place]  Heart Association (NYHA) Functional Class NYHA Class I  CHA2DS2-VASc Score = 7   This indicates a 11.2% annual risk of stroke. The patient's score is based upon: CHF History: 1 HTN History: 1 Diabetes History: 0 Stroke History: 2 Vascular Disease History: 0 Age Score: 2 Gender Score: 1     For questions or updates, please contact [facility] Please consult [email or web address] for contact info under     Signed, Waddell DELENA Donath, PA-C  11/09/2023 1:19 PM

## 2023-11-09 NOTE — Evaluation (Signed)
 Occupational Therapy Assessment and Plan  Patient Details  Name: Courtney Cline MRN: [id number] Date of Birth: 1933-04-24  OT Diagnosis: abnormal posture, muscle weakness (generalized), and decreased balance strategies and activity tolerance Rehab Potential: Rehab Potential (ACUTE ONLY): Fair ELOS: ~2 weeks   Today's Date: 11/09/2023 OT Individual Time: 8854-8794 OT Individual Time Calculation (min): 20 min  and Today's Date: 11/09/2023 OT Missed Time: 55 Minutes Missed Time Reason: Other (comment) (Limited eval due to elevated/variable HR (67-135 bpm).)    Hospital Problem: Principal Problem:   CVA (cerebral vascular accident) [facility])   Past Medical History:  Past Medical History:  Diagnosis Date   Allergic rhinitis    Arthritis    Breast calcification seen on mammogram    Right breast   Breast cancer (HCC)    COPD (chronic obstructive pulmonary disease) (HCC)    GERD (gastroesophageal reflux disease)    Hyperglycemia    Hyperlipidemia    Hypertension    Low back pain    On home oxygen  therapy    all the time   Osteopenia    Peripheral edema    Shortness of breath    Subclavian artery stenosis, left    Vitamin D  deficiency    Wears dentures    top   Wears glasses    reading   Past Surgical History:  Past Surgical History:  Procedure Laterality Date   BREAST BIOPSY     BREAST LUMPECTOMY     right 2015   BREAST LUMPECTOMY WITH RADIOACTIVE SEED LOCALIZATION Right 12/20/2013   Procedure: RIGHT BREAST LUMPECTOMY WITH RADIOACTIVE SEED LOCALIZATION;  Surgeon: Debby Shipper, MD;  Location: [facility];  Service: General;  Laterality: Right;   CAROTID DUPLEX  2014   CATARACT EXTRACTION     both eyes    Assessment & Plan Clinical Impression: Patient  is a 89 year old female with past medical history significant for breast cancer, COPD, hypertension, and hyperlipidemia. The patient initially presented to [facility] on 11/04/2023 as a possible  stroke alert due to altered mental status, confusion, and dysarthria. A CT scan of the head showed no acute process. However, an MRI revealed an acute left thalamic lacunar infarct, and an MRA of the head without contrast showed left PCA P2 segment occlusion with intermittent distal reconstitution. Neurology assessed that the etiology was embolic due to a history of atrial fibrillation and noncompliance with anticoagulation therapy. Consequently, the patient was resumed on Eliquis . A transthoracic echocardiogram showed an ejection fraction of 60-65% with grade two diastolic dysfunction, with no change compared to a previous echocardiogram.  The hospital course was complicated by acute metabolic encephalopathy secondary to the stroke and UTI secondary to acute cystitis. The patient completed a 3 day course of IV ceftriaxone . Patient transferred to CIR on 11/08/2023 .    Patient currently requires mod-max A with basic self-care skills secondary to muscle weakness, decreased cardiorespiratoy endurance, and decreased standing balance, decreased postural control, and decreased balance strategies.  Prior to hospitalization, patient could complete BADLs with Mod I.   Patient will benefit from skilled intervention to increase independence with basic self-care skills prior to discharge home with care partner.  Anticipate patient will require minimal physical assistance and follow up home health.  OT - End of Session Activity Tolerance: Tolerates < 10 min activity with changes in vital signs Endurance Deficit: Yes OT Assessment Rehab Potential (ACUTE ONLY): Fair OT Barriers to Discharge: Lack of/limited family support OT Patient demonstrates impairments in  the following area(s): Balance;Cognition;Endurance;Safety OT Basic ADL's Functional Problem(s): Bathing;Dressing;Toileting OT Transfers Functional Problem(s): Toilet;Tub/Shower OT Plan OT Intensity: Minimum of 1-2 x/day, 45 to 90 minutes OT Frequency: 5 out  of 7 days OT Duration/Estimated Length of Stay: ~2 weeks OT Treatment/Interventions: Balance/vestibular training;Cognitive remediation/compensation;Discharge planning;Disease mangement/prevention;DME/adaptive equipment instruction;Community reintegration;Functional electrical stimulation;Functional mobility training;Neuromuscular re-education;Pain management;Patient/family education;Psychosocial support;Self Care/advanced ADL retraining;Skin care/wound managment;Splinting/orthotics;Therapeutic Activities;Therapeutic Exercise;UE/LE Strength taining/ROM;UE/LE Coordination activities;Visual/perceptual remediation/compensation;Wheelchair propulsion/positioning OT Basic Self-Care Anticipated Outcome(s): Min A OT Toileting Anticipated Outcome(s): Min A OT Bathroom Transfers Anticipated Outcome(s): Min A OT Recommendation Recommendations for Other Services: None Patient destination: Home Follow Up Recommendations: Home health OT Equipment Recommended: To be determined   OT Evaluation Precautions/Restrictions  Precautions Precautions: Fall Recall of Precautions/Restrictions: Impaired Precaution/Restrictions Comments: Watch HR and BP; 4L O2 [place] Restrictions Weight Bearing Restrictions Per Provider Order: No General Chart Reviewed: Yes Family/Caregiver Present: Yes (Son) Pain Pain Assessment Pain Scale: 0-10 Pain Score: 0-No pain Home Living/Prior Functioning Home Living Family/patient expects to be discharged to:: Unsure Living Arrangements: Children Available Help at Discharge: Family, Available 24 hours/day Type of Home: House Home Access: Stairs to enter [employer] of Steps: 4 Entrance Stairs-Rails: Right, Left Home Layout: One level Bathroom Shower/Tub: Tub/shower unit, Sponge bathes at baseline [employer]: Standard Bathroom Accessibility: Yes Additional Comments: Pt lives with her husband who is available all day, but cannot assist physically  Lives With:  Spouse Prior Function Level of Independence: Requires assistive device for independence, Independent with gait, Independent with transfers, Independent with basic ADLs (3-Wheeled Rollator)  Able to Take Stairs?: Yes Vision Baseline Vision/History: 1 Wears glasses (Readers) Ability to See in Adequate Light: 1 Impaired Patient Visual Report: No change from baseline Additional Comments: Not formally assessed. Perception  Perception: Not tested Praxis Praxis: Not tested Cognition Cognition Overall Cognitive Status: Within Functional Limits for tasks assessed Arousal/Alertness: Awake/alert Memory: Impaired Memory Impairment: Decreased recall of new information;Storage deficit Awareness: Appears intact Safety/Judgment: Appears intact Brief Interview for Mental Status (BIMS) Repetition of Three Words (First Attempt): 3 Temporal Orientation: Year: Correct Temporal Orientation: Month: Accurate within 5 days Temporal Orientation: Day: Correct Recall: Sock: No, could not recall Recall: Blue: No, could not recall Recall: Bed: Yes, after cueing (a piece of furniture) BIMS Summary Score: 10 Sensation Sensation Light Touch: Appears Intact Coordination Gross Motor Movements are Fluid and Coordinated: No Fine Motor Movements are Fluid and Coordinated: No Coordination and Movement Description: Limited eval due to elevated/variable HR (67-135 bpm). Grossly uncoordinated due to generalized weakness and decreased activity tolerance. Heel Shin Test: Bilateral but smaller range Motor  Motor Motor: Abnormal postural alignment and control Motor - Skilled Clinical Observations: Limited eval due to elevated/variable HR (67-135 bpm). Grossly uncoordinated due to generalized weakness and decreased activity tolerance.  Trunk/Postural Assessment  Cervical Assessment Cervical Assessment: Exceptions to [facility] (forward head) Thoracic Assessment Thoracic Assessment: Exceptions to [facility]  (kyphotic) Lumbar Assessment Lumbar Assessment: Exceptions to [facility] (posterior pelvic tilt.) Postural Control Postural Control: Deficits on evaluation Trunk Control: Posterior lean Righting Reactions: Decreased/delayed Protective Responses: Decreased/delayed  Balance Balance Balance Assessed: Yes Static Sitting Balance Static Sitting - Balance Support: Feet supported Static Sitting - Level of Assistance: 5: Stand by assistance (SUP) Dynamic Sitting Balance Dynamic Sitting - Balance Support: During functional activity Dynamic Sitting - Level of Assistance: 4: Min assist;5: Stand by assistance (CGA-Min A) Dynamic Sitting - Balance Activities: Forward lean/weight shifting Static Standing Balance Static Standing - Balance Support: During functional activity;No upper extremity supported Static Standing - Level of  Assistance: 4: Min assist;3: Mod assist Extremity/Trunk Assessment RUE Assessment RUE Assessment: Exceptions to [facility] Active Range of Motion (AROM) Comments: WFL General Strength Comments: 3+/5 LUE Assessment LUE Assessment: Exceptions to [facility] Active Range of Motion (AROM) Comments: WFL General Strength Comments: 3+/5  Care Tool Care Tool Self Care Eating   Eating Assist Level: Set up assist    Oral Care    Oral Care Assist Level: Set up assist    Bathing Bathing activity did not occur: Safety/medical concerns (Limited eval due to elevated/variable HR (67-135 bpm).)            Upper Body Dressing(including orthotics) Upper body dressing/undressing activity did not occur (including orthotics): Safety/medical concerns (Limited eval due to elevated/variable HR (67-135 bpm).)          Lower Body Dressing (excluding footwear) Lower body dressing activity did not occur: Safety/medical concerns (Limited eval due to elevated/variable HR (67-135 bpm).)        Putting on/Taking off footwear   What is the patient wearing?: Non-skid slipper socks Assist for footwear:  Dependent - Patient 0%       Care Tool Toileting Toileting activity   Assist for toileting: 2 Helpers     Care Tool Bed Mobility Roll left and right activity   Roll left and right assist level: Minimal Assistance - Patient > 75%    Sit to lying activity   Sit to lying assist level: Moderate Assistance - Patient 50 - 74%    Lying to sitting on side of bed activity   Lying to sitting on side of bed assist level: the ability to move from lying on the back to sitting on the side of the bed with no back support.: Moderate Assistance - Patient 50 - 74%     Care Tool Transfers Sit to stand transfer Sit to stand activity did not occur: Safety/medical concerns      Chair/bed transfer Chair/bed transfer activity did not occur: Safety/medical concerns       Toilet transfer   Assist Level: Moderate Assistance - Patient 50 - 74%     Care Tool Cognition  Expression of Ideas and Wants Expression of Ideas and Wants: 4. Without difficulty (complex and basic) - expresses complex messages without difficulty and with speech that is clear and easy to understand  Understanding Verbal and Non-Verbal Content Understanding Verbal and Non-Verbal Content: 4. Understands (complex and basic) - clear comprehension without cues or repetitions   Memory/Recall Ability Memory/Recall Ability : That he or she is in a hospital/hospital unit   Refer to Care Plan for Long Term Goals  SHORT TERM GOAL WEEK 1 OT Short Term Goal 1 (Week 1): Pt will perform toilet transfers with Min A + LRAD. OT Short Term Goal 2 (Week 1): Pt will tolerate >2 mins of standing with Min A + LRAD, in preparation for standing ADLs. OT Short Term Goal 3 (Week 1): Pt will thread LB garments with Min A.  Recommendations for other services: None    Skilled Therapeutic Intervention Session began with introduction to OT role, OT POC, and general orientation to rehab unit/schedule. Evaluation limited to bed mobility, toilet transfer, and  3/3 toileting tasks with levels of assistance noted below, due to elevated/variable HR. Pt received and maintain on 4L O2 [place] per use at home. Pt remained resting in bed with all immediate needs met, son at bedside.  ADL ADL Eating: Unable to assess Grooming: Unable to assess Upper Body Bathing: Unable to assess  Lower Body Bathing: Unable to assess Upper Body Dressing: Unable to assess Lower Body Dressing: Unable to assess Toileting: Maximal assistance Where Assessed-Toileting: Bedside Commode Toilet Transfer: Moderate assistance Toilet Transfer Method: Stand pivot Toilet Transfer Equipment: Bedside commode Tub/Shower Transfer: Unable to assess Film/video editor: Unable to assess ADL Comments: Evaluation limited due to elevated/variable HR (67-135 bpm). Mobility  Bed Mobility Bed Mobility: Rolling Right;Rolling Left;Left Sidelying to Sit Rolling Right: Minimal Assistance - Patient > 75% Rolling Left: Minimal Assistance - Patient > 75% Left Sidelying to Sit: Moderate Assistance - Patient 50-74%;Minimal Assistance - Patient >75% Transfers Sit to Stand: Minimal Assistance - Patient > 75%;Moderate Assistance - Patient 50-74% Stand to Sit: Minimal Assistance - Patient > 75%   Discharge Criteria: Patient will be discharged from OT if patient refuses treatment 3 consecutive times without medical reason, if treatment goals not met, if there is a change in medical status, if patient makes no progress towards goals or if patient is discharged from hospital.  The above assessment, treatment plan, treatment alternatives and goals were discussed and mutually agreed upon: by patient and by family  [name], OTR/L, MSOT  11/09/2023, 12:45 PM

## 2023-11-09 NOTE — Plan of Care (Signed)
  Problem: RH Balance Goal: LTG Patient will maintain dynamic standing with ADLs (OT) Description: LTG:  Patient will maintain dynamic standing balance with assist during activities of daily living (OT)  Flowsheets (Taken 11/09/2023 1252) LTG: Pt will maintain dynamic standing balance during ADLs with: Contact Guard/Touching assist   Problem: RH Bathing Goal: LTG Patient will bathe all body parts with assist levels (OT) Description: LTG: Patient will bathe all body parts with assist levels (OT) Flowsheets (Taken 11/09/2023 1252) LTG: Pt will perform bathing with assistance level/cueing: Minimal Assistance - Patient > 75%   Problem: RH Dressing Goal: LTG Patient will perform upper body dressing (OT) Description: LTG Patient will perform upper body dressing with assist, with/without cues (OT). Flowsheets (Taken 11/09/2023 1252) LTG: Pt will perform upper body dressing with assistance level of: Set up assist Goal: LTG Patient will perform lower body dressing w/assist (OT) Description: LTG: Patient will perform lower body dressing with assist, with/without cues in positioning using equipment (OT) Flowsheets (Taken 11/09/2023 1252) LTG: Pt will perform lower body dressing with assistance level of: Minimal Assistance - Patient > 75%   Problem: RH Toileting Goal: LTG Patient will perform toileting task (3/3 steps) with assistance level (OT) Description: LTG: Patient will perform toileting task (3/3 steps) with assistance level (OT)  Flowsheets (Taken 11/09/2023 1252) LTG: Pt will perform toileting task (3/3 steps) with assistance level: Minimal Assistance - Patient > 75%   Problem: RH Toilet Transfers Goal: LTG Patient will perform toilet transfers w/assist (OT) Description: LTG: Patient will perform toilet transfers with assist, with/without cues using equipment (OT) Flowsheets (Taken 11/09/2023 1252) LTG: Pt will perform toilet transfers with assistance level of: Contact Guard/Touching assist    Problem: RH Tub/Shower Transfers Goal: LTG Patient will perform tub/shower transfers w/assist (OT) Description: LTG: Patient will perform tub/shower transfers with assist, with/without cues using equipment (OT) Flowsheets (Taken 11/09/2023 1252) LTG: Pt will perform tub/shower stall transfers with assistance level of: Contact Guard/Touching assist

## 2023-11-09 NOTE — Progress Notes (Signed)
                                                         PROGRESS NOTE    Nurse reported patients hr of 150's sustained while sitting on the side of the bed. This was accompanied by a headache but not reports of chest pain or shortness of breath. Hx of A.fib. Vitals, apical pulse and EKG  to be obtained. Verbal order for metoprolol  25 mg. Cardiology has been consulted.    A FACE TO FACE EVALUATION WAS PERFORMED  Courtney Cline 11/09/2023, 9:51 AM

## 2023-11-09 NOTE — Plan of Care (Signed)
  Problem: RH Swallowing Goal: LTG Patient will consume least restrictive diet using compensatory strategies with assistance (SLP) Description: LTG:  Patient will consume least restrictive diet using compensatory strategies with assistance (SLP) Flowsheets (Taken 11/09/2023 1700) LTG: Pt Patient will consume least restrictive diet using compensatory strategies with assistance of (SLP): Modified Independent   Problem: RH Expression Communication Goal: LTG Patient will increase word finding of common (SLP) Description: LTG:  Patient will increase word finding of common objects/daily info/abstract thoughts with cues using compensatory strategies (SLP). Flowsheets (Taken 11/09/2023 1700) LTG: Patient will increase word finding of common (SLP): Modified Independent   Problem: RH Problem Solving Goal: LTG Patient will demonstrate problem solving for (SLP) Description: LTG:  Patient will demonstrate problem solving for basic/complex daily situations with cues  (SLP) Flowsheets (Taken 11/09/2023 1700) LTG: Patient will demonstrate problem solving for (SLP):  Basic daily situations  Complex daily situations LTG Patient will demonstrate problem solving for: Modified Independent Note: Mildly complex   Problem: RH Memory Goal: LTG Patient will demonstrate ability for day to day (SLP) Description: LTG:   Patient will demonstrate ability for day to day recall/carryover during cognitive/linguistic activities with assist  (SLP) Flowsheets (Taken 11/09/2023 1700) LTG: Patient will demonstrate ability for day to day recall: New information LTG: Patient will demonstrate ability for day to day recall/carryover during cognitive/linguistic activities with assist (SLP): Supervision

## 2023-11-09 NOTE — Progress Notes (Signed)
 Inpatient Rehabilitation  Patient information reviewed and entered into eRehab system by Feliberto Gottron, M.A., CCC-SLP, Rehab Quality Coordinator.  Information including medical coding, functional ability and quality indicators will be reviewed and updated through discharge.

## 2023-11-09 NOTE — Progress Notes (Signed)
 Inpatient Rehabilitation Care Coordinator Assessment and Plan Patient Details  Name: Courtney Cline MRN: [id number] Date of Birth: December 16, 1933  Today's Date: 11/09/2023  Hospital Problems: Principal Problem:   CVA (cerebral vascular accident) [facility]) Active Problems:   Chronic heart failure with preserved ejection fraction (HCC)  Past Medical History:  Past Medical History:  Diagnosis Date   Allergic rhinitis    Arthritis    Breast calcification seen on mammogram    Right breast   Breast cancer (HCC)    COPD (chronic obstructive pulmonary disease) (HCC)    GERD (gastroesophageal reflux disease)    Hyperglycemia    Hyperlipidemia    Hypertension    Low back pain    On home oxygen  therapy    all the time   Osteopenia    Peripheral edema    Shortness of breath    Subclavian artery stenosis, left    Vitamin D  deficiency    Wears dentures    top   Wears glasses    reading   Past Surgical History:  Past Surgical History:  Procedure Laterality Date   BREAST BIOPSY     BREAST LUMPECTOMY     right 2015   BREAST LUMPECTOMY WITH RADIOACTIVE SEED LOCALIZATION Right 12/20/2013   Procedure: RIGHT BREAST LUMPECTOMY WITH RADIOACTIVE SEED LOCALIZATION;  Surgeon: Debby Shipper, MD;  Location: [facility];  Service: General;  Laterality: Right;   CAROTID DUPLEX  2014   CATARACT EXTRACTION     both eyes   Social History:  reports that she quit smoking about 29 years ago. Her smoking use included cigarettes. She started smoking about 69 years ago. She has a 40 pack-year smoking history. She has never used smokeless tobacco. She reports that she does not drink alcohol  and does not use drugs.  Family / Support Systems Marital Status: Married How Long?: 74 years Patient Roles: Spouse, Parent Spouse/Significant Other: [name] Sr (husband); has dementia Children: [name] Raddle and [name] - both live in [place] and live about 10 minutes from their home. Other Supports: DIL  Lucero Anticipated Caregiver: Pt sons and DIL Lucero (Brian's wife) to help assist with care needs Ability/Limitations of Caregiver: Pt son [name] and his wife Lucero are willing to help and assist, and will likely movein based on the level of care his mother and father require. 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 Pt is not a Cytogeneticist. Education: 9th grade Health Literacy - How often do you need to have someone help you when you read instructions, pamphlets, or other written material from your doctor or pharmacy?: Rarely Writes: Yes Employment Status: Retired Age Retired: 64 Marine scientist Issues: Denies Guardian/Conservator: Denies   Abuse/Neglect Abuse/Neglect Assessment Can Be Completed: Yes Physical Abuse: Denies Verbal Abuse: Denies Sexual Abuse: Denies Exploitation of patient/patient's resources: Denies Self-Neglect: Denies  Patient response to: Social Isolation - How often do you feel lonely or isolated from those around you?: Never  Emotional Status Pt's affect, behavior and adjustment status: Pt in good spirits at time of visit Recent Psychosocial Issues: Denies Psychiatric History: Denies Substance Abuse History: Denies  Patient / Family Perceptions, Expectations & Goals Pt/Family understanding of illness & functional limitations: Pt and family have a general understanding of care needs Premorbid pt/family roles/activities: Assistance with meals and cleaning Anticipated changes in roles/activities/participation: Continued assistsance with ADLs/IADLs Pt/family expectations/goals: Pt  goal is to work on walking.  Community Resources [employer]: None Premorbid Home Care/DME Agencies: None Transportation available at discharge: TBD Is the patient able to  respond to transportation needs?: Yes In the past 12 months, has lack of transportation kept you from medical appointments or from getting medications?: No In the past 12 months, has lack of transportation kept you from meetings, work, or from getting things needed for daily living?: No Resource referrals recommended: Neuropsychology  Discharge Planning Living Arrangements: Spouse/significant other Support Systems: Children, Other relatives Type of Residence: Private residence Insurance Resources: Media planner (specify) ([employer] Medicare) Financial Resources: Restaurant manager, fast food Screen Referred: No Living Expenses: Industrial/product designer Money Management: Family (Son [name] helps with managing bills) Does the patient have any problems obtaining your medications?: No Home Management: Pt son would help with housekeeping, adn she reports she would cook light meals, gets meals on wheels, and sometimes assistance from families Patient/Family Preliminary Plans: no changes Care Coordinator Barriers to Discharge: Decreased caregiver support, Lack of/limited family support, Insurance for SNF coverage Care Coordinator Anticipated Follow Up Needs: HH/OP  Clinical Impression SW met with pt and pt son [name] at bedside to introduce self, explain role, discuss discharge process. Pt is not a Cytogeneticist. No HCPOA. DME- ramp at front entrance, separate entrance has bilateral handrails, 3 wheel rollator (primary use), TTB, 3in1 BSC, RW, and cane.  Kelwin Gibler A Nil Xiong 11/09/2023, 3:25 PM

## 2023-11-09 NOTE — Evaluation (Signed)
 Speech Language Pathology Assessment and Plan  Patient Details  Name: Courtney Cline MRN: [id number] Date of Birth: [birth year]  SLP Diagnosis: Aphasia;Dysphagia;Cognitive Impairments  Rehab Potential: Excellent ELOS: 10-14 days    Today's Date: 11/09/2023 SLP Individual Time: 1330-1430 SLP Individual Time Calculation (min): 60 min   Hospital Problem: Principal Problem:   CVA (cerebral vascular accident) (HCC) Active Problems:   Chronic heart failure with preserved ejection fraction (HCC)  Past Medical History:  Past Medical History:  Diagnosis Date   Allergic rhinitis    Arthritis    Breast calcification seen on mammogram    Right breast   Breast cancer (HCC)    COPD (chronic obstructive pulmonary disease) (HCC)    GERD (gastroesophageal reflux disease)    Hyperglycemia    Hyperlipidemia    Hypertension    Low back pain    On home oxygen  therapy    all the time   Osteopenia    Peripheral edema    Shortness of breath    Subclavian artery stenosis, left    Vitamin D  deficiency    Wears dentures    top   Wears glasses    reading   Past Surgical History:  Past Surgical History:  Procedure Laterality Date   BREAST BIOPSY     BREAST LUMPECTOMY     right 2015   BREAST LUMPECTOMY WITH RADIOACTIVE SEED LOCALIZATION Right 12/20/2013   Procedure: RIGHT BREAST LUMPECTOMY WITH RADIOACTIVE SEED LOCALIZATION;  Surgeon: Debby Shipper, MD;  Location: [facility];  Service: General;  Laterality: Right;   CAROTID DUPLEX  2014   CATARACT EXTRACTION     both eyes    Assessment / Plan / Recommendation Clinical Impression  Pt is a 89 year old female with past medical history significant for breast cancer, COPD, hypertension, and hyperlipidemia. The patient initially presented to [facility] on 11/04/2023 as a possible stroke alert due to altered mental status, confusion, and dysarthria. A CT scan of the head showed no acute process. However, an MRI  revealed an acute left thalamic lacunar infarct, and an MRA of the head without contrast showed left PCA P2 segment occlusion with intermittent distal reconstitution. Neurology assessed that the etiology was embolic due to a history of atrial fibrillation and noncompliance with anticoagulation therapy. Consequently, the patient was resumed on Eliquis . A transthoracic echocardiogram showed an ejection fraction of 60-65% with grade two diastolic dysfunction, with no change compared to a previous echocardiogram.  The hospital course was complicated by acute metabolic encephalopathy secondary to the stroke and UTI secondary to acute cystitis. The patient completed a 3 day course of IV ceftriaxone . The patient was also evaluated by speech therapy for dysphagia and recommended a dysphagia 3 (D3) diet. Previously, the patient was independent with a rolling walker and family assistance for stair navigation, but currently requires minimal assistance for mobility and moderate assistance for transfers and ADLs. Therapy evaluations completed due to patient decreased functional mobility was admitted for a comprehensive rehab program.   Pt presented w/ mild expressive language deficits. Functional verbal expression WFL; deficits were only noted w/ tasks of increased complexity. Additionally, pt completed written expression tasks @ modI. Receptive language intact. She presented w/ adequate cognition throughout majority of eval tasks, but would benefit from goals targeting mildly complex problem solving and STM to assist w/ return to prev roles/responsibilities. Speech was judged to be 100% intelligible throughout.   She reported difficulty w/ mastication of Dys 3 textures as well as  intermittent buccal pocketing (able to resolve w/ lingual sweep). PO intake remains minimal and she was agreeable to try Dys 2 textures to attempt to improve overall intake. No overt s/s of airway invasion noted w/ thin liquids via straw. Her son  reported intermittent coughing w/ thin liquids, will continue to dynamically assess diet tolerance and complete instrumental imaging as needed.   Recommend skilled ST services ot target aforementioned deficits, maximize pt independence, and facilitate return to prev roles/responsibilities.      Skilled Therapeutic Interventions          SLP facilitated a cognitive-linguistic evaluation and brief bedside swallow screen to assess pt's cognitive-communication skills and determine need for additional skilled ST services. See above for more information.    SLP Assessment  Patient will need skilled Speech Lanaguage Pathology Services during CIR admission    Recommendations  SLP Diet Recommendations: Dysphagia 2 (Fine chop);Thin Liquid Administration via: Straw Medication Administration: Crushed with puree Supervision: Patient able to self feed;Intermittent supervision to cue for compensatory strategies Compensations: Minimize environmental distractions;Slow rate;Small sips/bites Postural Changes and/or Swallow Maneuvers: Seated upright 90 degrees;Upright 30-60 min after meal Oral Care Recommendations: Oral care BID Recommendations for Other Services: Therapeutic Recreation consult;Neuropsych consult Therapeutic Recreation Interventions: Pet therapy Patient destination: Home Equipment Recommended: None recommended by SLP    SLP Frequency 3 to 5 out of 7 days   SLP Duration  SLP Intensity  SLP Treatment/Interventions 10-14 days  Minumum of 1-2 x/day, 30 to 90 minutes  Cognitive remediation/compensation;Functional tasks;Cueing hierarchy;Patient/family education;Speech/Language facilitation;Dysphagia/aspiration precaution training;Therapeutic Activities    Pain  None   Prior Functioning  independent  SLP Evaluation Cognition Overall Cognitive Status: Within Functional Limits for tasks assessed Arousal/Alertness: Awake/alert Orientation Level: Oriented X4 Awareness: Appears  intact Safety/Judgment: Appears intact  Comprehension Auditory Comprehension Overall Auditory Comprehension: Appears within functional limits for tasks assessed Yes/No Questions: Within Functional Limits Commands: Within Functional Limits Conversation: Simple Reading Comprehension Reading Status: Not tested Expression Expression Primary Mode of Expression: Verbal Verbal Expression Overall Verbal Expression: Impaired Initiation: No impairment Automatic Speech: Social Response Level of Generative/Spontaneous Verbalization: Conversation Repetition: Impaired Level of Impairment: Sentence level Naming: Impairment Responsive: 76-100% accurate Confrontation: Impaired Divergent: 75-100% accurate Verbal Errors: Semantic paraphasias Pragmatics: No impairment Written Expression Dominant Hand: Right Written Expression: Within Functional Limits Oral Motor Oral Motor/Sensory Function Overall Oral Motor/Sensory Function: Mild impairment Facial ROM: Reduced right Facial Symmetry: Within Functional Limits Facial Strength: Reduced right Lingual ROM: Within Functional Limits Lingual Symmetry: Within Functional Limits Lingual Strength: Within Functional Limits Motor Speech Overall Motor Speech: Appears within functional limits for tasks assessed  Care Tool Care Tool Cognition Ability to hear (with hearing aid or hearing appliances if normally used Ability to hear (with hearing aid or hearing appliances if normally used): 0. Adequate - no difficulty in normal conservation, social interaction, listening to TV   Expression of Ideas and Wants Expression of Ideas and Wants: 4. Without difficulty (complex and basic) - expresses complex messages without difficulty and with speech that is clear and easy to understand   Understanding Verbal and Non-Verbal Content Understanding Verbal and Non-Verbal Content: 4. Understands (complex and basic) - clear comprehension without cues or repetitions   Memory/Recall Ability Memory/Recall Ability : Current season;That he or she is in a hospital/hospital unit   Motor Speech Assessment  WFL    Bedside Swallowing Assessment Thin Liquid Thin Liquid: Within functional limits Presentation: Straw   Short Term Goals: Week 1: SLP Short Term Goal 1 (Week 1): Pt  will solve mildly complex problems w/ supervisionA SLP Short Term Goal 2 (Week 1): Pt will complete mildly specific word finding tasks w/ supervisionA SLP Short Term Goal 3 (Week 1): Pt will tolerate the lest restrictive diet w/ supervisionA SLP Short Term Goal 4 (Week 1): Pt willl recall recent/relevant info w/ supervisionA  Refer to Care Plan for Long Term Goals  Recommendations for other services: Neuropsych and Therapeutic Recreation  Pet therapy  Discharge Criteria: Patient will be discharged from SLP if patient refuses treatment 3 consecutive times without medical reason, if treatment goals not met, if there is a change in medical status, if patient makes no progress towards goals or if patient is discharged from hospital.  The above assessment, treatment plan, treatment alternatives and goals were discussed and mutually agreed upon: by patient  [name] 11/09/2023, 4:59 PM

## 2023-11-09 NOTE — Progress Notes (Signed)
 Inpatient Rehabilitation Admission Medication Review by a Pharmacist  A complete drug regimen review was completed for this patient to identify any potential clinically significant medication issues.  High Risk Drug Classes Is patient taking? Indication by Medication  Antipsychotic Yes, as an intravenous medication PRN Prochlorperazine  (PO, PR or IV) - nausea  Anticoagulant Yes Apixaban  - atrial fibrillation and CVA prophylaxis  Antibiotic No   Opioid No   Antiplatelet No   Hypoglycemics/insulin No   Vasoactive Medication Yes Metoprolol  - atrial fibrillation, hypertension  Chemotherapy No   Other Yes Atorvastatin  - hyperlipidemia Breztri  - COPD Senna - laxative  PRNs: Acetaminophen  - mild pain Maalox - indigestion Guaifenesin -dextromethorphan - cough Duonebs - wheezing, shortness of breath Melatonin - insomnia Trazodone  - sleep Bisacodyl  PR, Fleets enema - constipation     Type of Medication Issue Identified Description of Issue Recommendation(s)  Drug Interaction(s) (clinically significant)     Duplicate Therapy     Allergy     No Medication Administration End Date     Incorrect Dose     Additional Drug Therapy Needed     Significant med changes from prior encounter (inform family/care partners about these prior to discharge). Rosuvastatin  changed to Atorvastatin . Metoprolol  succinate changed to Metoprolol  tartrate.  Off PTA: furosemide  (held while inpatient with AKI, improved), hydralazine  (discontinued this am), vitamin D , vitamin B-12. Communicate changes with patient/family prior to discharge.  Other  Trelegy PTA > inpatient substitution Breztri . Back to Trelegy at discharge.    Clinically significant medication issues were identified that warrant physician communication and completion of prescribed/recommended actions by midnight of the next day:  No  Pharmacist comments:  - Noted history of nosebleeds and was only taking Apixaban  once daily PTA.  Followed by  ENT.  Back to BID dosing while inpatient. To monitor for nosebleeds.  Time spent performing this drug regimen review (minutes):  20   Courtney Cline, [place] 11/09/2023 12:02 PM

## 2023-11-10 DIAGNOSIS — I480 Paroxysmal atrial fibrillation: Secondary | ICD-10-CM | POA: Diagnosis not present

## 2023-11-10 MED ORDER — ENSURE PLUS HIGH PROTEIN PO LIQD
237.0000 mL | Freq: Two times a day (BID) | ORAL | Status: DC
  Administered 2023-11-10 – 2023-11-15 (×8): 237 mL via ORAL

## 2023-11-10 MED ORDER — MIRTAZAPINE 15 MG PO TABS
7.5000 mg | ORAL_TABLET | Freq: Every day | ORAL | Status: DC
Start: 2023-11-10 — End: 2023-11-23
  Administered 2023-11-10 – 2023-11-22 (×13): 7.5 mg via ORAL
  Filled 2023-11-10 (×13): qty 1

## 2023-11-10 MED ORDER — ADULT MULTIVITAMIN W/MINERALS CH
1.0000 | ORAL_TABLET | Freq: Every day | ORAL | Status: DC
Start: 2023-11-10 — End: 2023-11-23
  Administered 2023-11-10 – 2023-11-23 (×14): 1 via ORAL
  Filled 2023-11-10 (×14): qty 1

## 2023-11-10 MED ORDER — SALINE SPRAY 0.65 % NA SOLN
1.0000 | NASAL | Status: DC | PRN
  Administered 2023-11-10 – 2023-11-13 (×3): 1 via NASAL
  Filled 2023-11-10: qty 44

## 2023-11-10 MED ORDER — SORBITOL 70 % SOLN
30.0000 mL | Freq: Once | Status: AC
  Administered 2023-11-10: 30 mL via ORAL
  Filled 2023-11-10: qty 30

## 2023-11-10 NOTE — Progress Notes (Signed)
 Occupational Therapy Session Note  Patient Details  Name: Courtney Cline MRN: [id number] Date of Birth: 1933/06/21  Today's Date: 11/10/2023 OT Individual Time: 0805-0900 OT Individual Time Calculation (min): 55 min    Short Term Goals: Week 1:  OT Short Term Goal 1 (Week 1): Pt will perform toilet transfers with Min A + LRAD. OT Short Term Goal 2 (Week 1): Pt will tolerate >2 mins of standing with Min A + LRAD, in preparation for standing ADLs. OT Short Term Goal 3 (Week 1): Pt will thread LB garments with Min A.  Skilled Therapeutic Interventions/Progress Updates:  Pt greeted resting in bed, son present at bedside. No reports of pain. Transition to EOB with supervision. UB care completed with setup/supervision, OT assisting with bathing back and removal of EKG stickers. Skin breakdown noted underneath breasts, LPN made aware. Pt stands from EOB with Min A, performing stand-step transfer to [facility] with Mod A + RW, demo strong R lateral lean. Pt continent of bladder, requiring Max A for 3/3 toileting tasks. Pt requires A for bathing of distal BLE for energy conservation, able to wash B thighs and periarea while seated at setup level. Max A for LB dressing. Stand-pivot without use of RW at [employer] A from [employer]. Extended rest-breaks provided throughout for management of decreased activity tolerance. Pt remained sitting in recliner with all immediate needs met.   Vitals: Supine: BP=104/55 & HR=61 bpm Sitting EOB post activity: BP=141/53 & HR=68-72 bpm  Therapy Documentation Precautions:  Precautions Precautions: Fall Recall of Precautions/Restrictions: Impaired Precaution/Restrictions Comments: Watch HR and BP; 4L O2 [place] Restrictions Weight Bearing Restrictions Per Provider Order: No   Therapy/Group: Individual Therapy  [name], OTR/L, MSOT  11/10/2023, 7:56 AM

## 2023-11-10 NOTE — Progress Notes (Signed)
 Initial Nutrition Assessment  DOCUMENTATION CODES:   Not applicable  INTERVENTION:   Ensure Plus High Protein po BID, each supplement provides 350 kcal and 20 grams of protein Magic cup TID with meals, each supplement provides 290 kcal and 9 grams of protein MVI with minerals daily  NUTRITION DIAGNOSIS:   Inadequate oral intake related to decreased appetite as evidenced by meal completion < 50%.  GOAL:   Patient will meet greater than or equal to 90% of their needs  MONITOR:   PO intake, Supplement acceptance  REASON FOR ASSESSMENT:   Consult Assessment of nutrition requirement/status, Poor PO  ASSESSMENT:   89 yo female admitted with functional deficits secondary to left thalamic capsular infarct. PMH includes HTN, HLD, HF, persistent A fib, GERD, COPD on 4 L oxygen  at baseline, osteopenia, vitamin D  deficiency, breast cancer.  Patient not available. Unable to reach by phone. She is currently on a dysphagia 2 diet with thin liquids. Meal intakes documented at 30-50%.   Weight history reviewed. Patient with 12% weight loss over the past month.   Labs reviewed. Medications reviewed and include remeron , miralax , senokot.  Patient is at increased nutrition risk, given severe weight loss, dysphagia, and poor oral intake. She would benefit from PO supplements and MVI to help meet her estimated nutrition needs to support rehab therapy.   NUTRITION - FOCUSED PHYSICAL EXAM:  Unable to complete at this time  Diet Order:   Diet Order             DIET DYS 2 Room service appropriate? Yes with Assist; Fluid consistency: Thin  Diet effective now                   EDUCATION NEEDS:   No education needs have been identified at this time  Skin:  Skin Assessment: Reviewed RN Assessment  Last BM:  9/22 type 6  Height:   Ht Readings from Last 1 Encounters:  11/08/23 5' 2 (1.575 m)    Weight:   Wt Readings from Last 1 Encounters:  11/10/23 63 kg    Ideal Body  Weight:  50 kg  BMI:  Body mass index is 25.4 kg/m.  Estimated Nutritional Needs:   Kcal:  1450-1650  Protein:  70-85 gm  Fluid:  1.4-1.6 L   Suzen HUNT RD, LDN, CNSC Contact via secure chat. If unavailable, use group chat RD Inpatient.

## 2023-11-10 NOTE — Progress Notes (Signed)
 Patient ID: Courtney Cline, female   DOB: 05-16-1933, 89 y.o.   MRN: [id number]   74- SW spoke with pt son [name] to  introduce self, explain role, and discuss discharge process. SW will provide updates from team conference.  Graeme Jude, MSW, LCSW Office: [phone] Cell: [phone] Fax: [phone]

## 2023-11-10 NOTE — Progress Notes (Signed)
 DAILY PROGRESS NOTE   Patient Name: Courtney Cline Date of Encounter: 11/10/2023 Cardiologist: Dorn Lesches, MD  Chief Complaint   No complaints  Patient Profile   Courtney Cline is a 89 y.o. female with a hx of hypertension, hyperlipidemia, chronic HFpEF, persistent atrial fibrillation, nonrheumatic aortic stenosis, left subclavian artery stenosis, history of breast cancer, COPD wearing 4 L oxygen  at baseline, GERD, who is being seen 11/09/2023 for the evaluation of A-fib RVR at the request of Dr. Emeline.   Subjective   Blood pressure appears to have improved today. HR control is better as well in the 60-70's - she was seen at rehab. Doing well, but tires easily. SPo2 100% - HR in the 60's.  Objective   Vitals:   11/09/23 2325 11/10/23 0331 11/10/23 0440 11/10/23 0749  BP: (!) 141/57 (!) 142/54    Pulse: 72 66  76  Resp: 16 18  (!) 22  Temp: 98.4 F (36.9 C) 98.6 F (37 C)    TempSrc: Oral Oral    SpO2: 100% 100%  98%  Weight:   63 kg   Height:        Intake/Output Summary (Last 24 hours) at 11/10/2023 1135 Last data filed at 11/09/2023 1300 Gross per 24 hour  Intake 100 ml  Output --  Net 100 ml   Filed Weights   11/08/23 1713 11/09/23 0519 11/10/23 0440  Weight: 63.6 kg 61.8 kg 63 kg    Physical Exam   General appearance: alert and no distress Lungs: clear to auscultation bilaterally Heart: regular rate and rhythm Extremities: extremities normal, atraumatic, no cyanosis or edema Neurologic: Grossly normal  Inpatient Medications    Scheduled Meds:  apixaban   2.5 mg Oral BID   atorvastatin   80 mg Oral Daily   budesonide -glycopyrrolate -formoterol   2 puff Inhalation BID   metoprolol  tartrate  25 mg Oral BID   mirtazapine   7.5 mg Oral QHS   polyethylene glycol  17 g Oral Daily   senna  1 tablet Oral QHS    Continuous Infusions:   PRN Meds: acetaminophen , alum & mag hydroxide-simeth, bisacodyl , guaiFENesin -dextromethorphan,  ipratropium-albuterol , melatonin, prochlorperazine  **OR** prochlorperazine  **OR** prochlorperazine , sodium phosphate, traZODone    Labs   Results for orders placed or performed during the hospital encounter of 11/08/23 (from the past 48 hours)  Comprehensive metabolic panel     Status: Abnormal   Collection Time: 11/09/23  5:14 AM  Result Value Ref Range   Sodium 140 135 - 145 mmol/L   Potassium 4.2 3.5 - 5.1 mmol/L   Chloride 106 98 - 111 mmol/L   CO2 29 22 - 32 mmol/L   Glucose, Bld 86 70 - 99 mg/dL    Comment: Glucose reference range applies only to samples taken after fasting for at least 8 hours.   BUN 19 8 - 23 mg/dL   Creatinine, Ser 8.57 (H) 0.44 - 1.00 mg/dL   Calcium  8.7 (L) 8.9 - 10.3 mg/dL   Total Protein 5.6 (L) 6.5 - 8.1 g/dL   Albumin 2.3 (L) 3.5 - 5.0 g/dL   AST 15 15 - 41 U/L   ALT 8 0 - 44 U/L   Alkaline Phosphatase 48 38 - 126 U/L   Total Bilirubin 0.5 0.0 - 1.2 mg/dL   GFR, Estimated 36 (L) >60 mL/min    Comment: (NOTE) Calculated using the CKD-EPI Creatinine Equation (2021)    Anion gap 5 5 - 15    Comment: Performed at [facility]  [facility], [address], [place], [name] [address]  CBC with Differential/Platelet     Status: Abnormal   Collection Time: 11/09/23  5:14 AM  Result Value Ref Range   WBC 6.8 4.0 - 10.5 K/uL   RBC 3.15 (L) 3.87 - 5.11 MIL/uL   Hemoglobin 9.1 (L) 12.0 - 15.0 g/dL   HCT 69.4 (L) 63.9 - 53.9 %   MCV 96.8 80.0 - 100.0 fL   MCH 28.9 26.0 - 34.0 pg   MCHC 29.8 (L) 30.0 - 36.0 g/dL   RDW 86.0 88.4 - 84.4 %   Platelets 283 150 - 400 K/uL   nRBC 0.0 0.0 - 0.2 %   Neutrophils Relative % 72 %   Neutro Abs 5.0 1.7 - 7.7 K/uL   Lymphocytes Relative 15 %   Lymphs Abs 1.0 0.7 - 4.0 K/uL   Monocytes Relative 8 %   Monocytes Absolute 0.5 0.1 - 1.0 K/uL   Eosinophils Relative 4 %   Eosinophils Absolute 0.2 0.0 - 0.5 K/uL   Basophils Relative 0 %   Basophils Absolute 0.0 0.0 - 0.1 K/uL   Immature Granulocytes 1 %   Abs Immature  Granulocytes 0.04 0.00 - 0.07 K/uL    Comment: Performed at [facility], [address] [place], [name] [address]  Troponin I (High Sensitivity)     Status: Abnormal   Collection Time: 11/09/23  9:49 AM  Result Value Ref Range   Troponin I (High Sensitivity) 31 (H) <18 ng/L    Comment: (NOTE) Elevated high sensitivity troponin I (hsTnI) values and significant  changes across serial measurements may suggest ACS but many other  chronic and acute conditions are known to elevate hsTnI results.  Refer to the Links section for chest pain algorithms and additional  guidance. Performed at [facility], [address] [place], [name] [address]     ECG   [name]  Telemetry   [name]  Radiology    No results found.  Cardiac Studies   [name]  Assessment   Principal Problem:   CVA (cerebral vascular accident) [facility]) Active Problems:   Chronic heart failure with preserved ejection fraction (HCC)   Plan   Much better today- BP has normalized. HR in the 60-70's, sounds regular on exam -suspect she is back in sinus rhythm. Would continue Eliquis  and metoprolol . Check EKG today to document. No further suggestions.  Will sign-off for now.  [facility] will sign off.   Medication Recommendations:  as above Other recommendations (labs, testing, etc):  none Follow up as an outpatient:  Dr. Court or APP, we will arrange follow-up  Time Spent Directly with Patient:  I have spent a total of 25 minutes with the patient reviewing hospital notes, telemetry, EKGs, labs and examining the patient as well as establishing an assessment and plan that was discussed personally with the patient.  > 50% of time was spent in direct patient care.  Length of Stay:  LOS: 2 days   Vinie KYM Maxcy, MD, [facility], FNLA, FACP  [place]  Medical Director of the [facility] Lipid [facility]  Card[facility]ovascular [facility] Clinic Diplomate of the American Board of Clinical  Lipidology Attending Cardiologist  Direct Dial: [phone]  Fax: [phone]  Website:  [email or web address] Vinie BROCKS Fue Cervenka 11/10/2023, 11:35 AM

## 2023-11-10 NOTE — Consult Note (Signed)
 Neuropsychological Consultation Comprehensive Inpatient Rehab   Patient:   Courtney Cline   DOB:   06-01-1933  MR Number:  [id number]  Location:  [facility] [address] CENTER [address] [name] [address] Dept: [phone] Loc: [phone]           Date of Service:   11/10/2023  Start Time:   9 AM End Time:   10 AM  Provider/Observer:  Norleen Asa, Psy.D.       Clinical Neuropsychologist       Billing Code/Service: [phone]  Reason for Service:    Courtney Cline is an 89 year old female referred for neuropsychological consultation due to coping and adjustment issues with current admission to the comprehensive inpatient rehabilitation unit status post thalamic stroke.  HISTORY OF PRESENTING CONCERNS: This is an 89 year old female admitted to the inpatient rehabilitation unit following a recent stroke. Hospitalization began 11/04/2023 at [facility] for altered mental status, confusion, and dysarthria. Initial head CT showed no acute process. A subsequent MRI revealed an acute left thalamic lacunar infarct. MRA of the head showed a left PCA P2 segment occlusion with intermittent distal reconstruction. Neurological assessment suggested an embolic etiology, likely related to a history of atrial fibrillation and recent non-compliance with anticoagulation. Hospital course was complicated by acute metabolic encephalopathy and a UTI secondary to acute cystitis, which was treated with IV antibiotics. Admission to rehabilitation was due to decreased functional mobility, particularly with transfers and ADLs.  RELEVANT PAST MEDICAL HISTORY: - Atrial fibrillation (Afib) - Breast cancer - COPD - Hypertension - Hyperlipidemia  BEHAVIORAL OBSERVATIONS & MENTAL STATUS: Mood appears euthymic, though reports feeling overwhelmed by the hospitalization. Affect is appropriate to content. Appears alert and oriented.  Demonstrates a basic understanding of the recent stroke. No gross focal neurological deficits were observed during the interview.  SESSION CONTENT/INTERVENTIONS: - Assessed patient's understanding of the recent stroke and current medical situation. - Provided psychoeducation regarding the nature of the left thalamic lacunar infarct, using an analogy to explain the function of the thalamus as a sensory information router etc. - Reassured the patient that the location of the infarct is not typically associated with severe, lasting cognitive deficits that would prevent a return to her prior level of functioning cognitively. - Discussed medication adherence, specifically regarding Eliquis . The patient confirmed she had stopped the medication due to epistaxis. - Provided education on the rationale for anticoagulation in the context of Afib to reduce stroke risk. - Emphasized the importance of communicating with her medical team about medication side effects before discontinuing treatment, explaining that dose adjustment may be an alternative. - Provided supportive counseling regarding the rehabilitation process and encouraged active participation.  Medical History:   Past Medical History:  Diagnosis Date   Allergic rhinitis    Arthritis    Breast calcification seen on mammogram    Right breast   Breast cancer (HCC)    COPD (chronic obstructive pulmonary disease) (HCC)    GERD (gastroesophageal reflux disease)    Hyperglycemia    Hyperlipidemia    Hypertension    Low back pain    On home oxygen  therapy    all the time   Osteopenia    Peripheral edema    Shortness of breath    Subclavian artery stenosis, left    Vitamin D  deficiency    Wears dentures    top   Wears glasses    reading  Patient Active Problem List   Diagnosis Date Noted   CVA (cerebral vascular accident) (HCC) 11/08/2023   Acute metabolic encephalopathy 11/04/2023   COPD with acute exacerbation (HCC)  02/15/2023   COPD exacerbation (HCC) 02/14/2023   CKD stage 3b, GFR 30-44 ml/min (HCC) 02/14/2023   Chronic heart failure with preserved ejection fraction (HCC) 02/14/2023   Obesity (BMI 30-39.9) 02/14/2023   Normocytic anemia 02/14/2023   Myocardial injury 02/14/2023   Acute on chronic respiratory failure with hypoxia (HCC) 01/02/2022   Elevated troponin 01/02/2022   Sepsis (HCC) 01/02/2022   Aortic stenosis 05/06/2021   Malnutrition of moderate degree 02/16/2021   AKI (acute kidney injury) 02/15/2021   CAP (community acquired pneumonia) 01/21/2020   Acute cystitis without hematuria    Persistent atrial fibrillation (HCC) 08/20/2019   COPD without exacerbation (HCC) 10/15/2018   Healthcare maintenance 10/15/2018   Physical deconditioning 10/15/2018   Sinusitis 01/29/2018   Diastolic CHF (HCC) 07/17/2017   Vitamin D  deficiency    Allergic rhinitis 11/23/2015   Osteopenia with high risk of fracture 11/05/2015   Post-nasal drip 03/16/2015   Anemia of chronic disease 03/10/2015   Subclavian artery stenosis, left 04/24/2014   Bilateral lower extremity edema 04/24/2014   Essential hypertension 04/24/2014   Hyperlipidemia 04/24/2014   Breast cancer of upper-inner quadrant of right female breast (HCC) 11/19/2013   Chronic hypoxemic respiratory failure (HCC) 10/02/2013     Impression/DX:   Patient demonstrates an adequate understanding of her recent stroke following psychoeducation. Current distress appears to be a situational adjustment reaction to hospitalization and functional changes. No evidence of a major mood or anxiety disorder was noted at this time.  Plan/Interventions: 1. Continue to monitor for coping and adjustment issues throughout the rehabilitation stay. 2. Provide supportive intervention as needed. 3. Reinforce adherence to the prescribed medical regimen, particularly anticoagulation. 4. Liaise with the treatment team regarding psychological factors impacting  rehabilitation progress. 5. Patient's team conference is scheduled for Tuesday to discuss discharge planning.          Electronically Signed   _______________________ Norleen Asa, Psy.D. Clinical Neuropsychologist

## 2023-11-10 NOTE — IPOC Note (Signed)
 Overall Plan of Care [facility]) Patient Details Name: Courtney Cline MRN: [id number] DOB: 19-Feb-1933  Admitting Diagnosis: CVA (cerebral vascular accident) [facility])  Hospital Problems: Principal Problem:   CVA (cerebral vascular accident) (HCC) Active Problems:   Chronic heart failure with preserved ejection fraction (HCC)     Functional Problem List: Nursing Bladder, Bowel, Edema, Endurance, Medication Management, Safety  PT Balance, Endurance, Motor  OT Balance, Cognition, Endurance, Safety  SLP Linguistic, Nutrition, Cognition  TR         Basic ADL's: OT Bathing, Dressing, Toileting     Advanced  ADL's: OT       Transfers: PT Bed Mobility, Bed to Chair, Car, Occupational psychologist, Research scientist (life sciences): PT Ambulation, Educational psychologist, Psychologist, prison and probation services     Additional Impairments: OT    SLP Swallowing, Communication, Social Cognition comprehension, expression Problem Solving, Memory  TR      Anticipated Outcomes Item Anticipated Outcome  Self Feeding    Swallowing  modI   Basic self-care  Min A  Toileting  Min A   Bathroom Transfers Min A  Bowel/Bladder  manage bowels with medications/ manage bladder with toileting assistance  Transfers  sup/mod I  Locomotion  supervision w/ LRAD  Communication  modI  Cognition  modI  Pain  <4 w/ prns  Safety/Judgment  manage safety with supervision-minimal assistance   Therapy Plan: PT Intensity: Minimum of 1-2 x/day ,45 to 90 minutes PT Frequency: 5 out of 7 days PT Duration Estimated Length of Stay: 10-14 days OT Intensity: Minimum of 1-2 x/day, 45 to 90 minutes OT Frequency: 5 out of 7 days OT Duration/Estimated Length of Stay: ~2 weeks SLP Intensity: Minumum of 1-2 x/day, 30 to 90 minutes SLP Frequency: 3 to 5 out of 7 days SLP Duration/Estimated Length of Stay: 10-14 days   Team Interventions: Nursing Interventions Patient/Family Education, Bladder Management, Bowel Management, Disease  Management/Prevention, Pain Management, Discharge Planning, Dysphagia/Aspiration Precaution Training  PT interventions Ambulation/gait training, Community reintegration, Neuromuscular re-education, Stair training, UE/LE Strength taining/ROM, Wheelchair propulsion/positioning, UE/LE Coordination activities, Therapeutic Activities, Discharge planning, Warden/ranger, Patient/family education, Functional mobility training, Therapeutic Exercise  OT Interventions Warden/ranger, Cognitive remediation/compensation, Discharge planning, Disease mangement/prevention, DME/adaptive equipment instruction, Community reintegration, Functional electrical stimulation, Functional mobility training, Neuromuscular re-education, Pain management, Patient/family education, Psychosocial support, Self Care/advanced ADL retraining, Skin care/wound managment, Splinting/orthotics, Therapeutic Activities, Therapeutic Exercise, UE/LE Strength taining/ROM, UE/LE Coordination activities, Visual/perceptual remediation/compensation, Wheelchair propulsion/positioning  SLP Interventions Cognitive remediation/compensation, Functional tasks, Financial trader, Patient/family education, Speech/Language facilitation, Dysphagia/aspiration precaution training, Therapeutic Activities  TR Interventions    SW/CM Interventions Discharge Planning, Psychosocial Support, Patient/Family Education   Barriers to Discharge MD  Medical stability, Home enviroment access/loayout, Lack of/limited family support, Insurance for SNF coverage, and Nutritional means  Nursing Decreased caregiver support, Home environment access/layout, Incontinence Discharge: House  Discharge Home Layout: One level  Discharge Home Access: Stairs to enter  Entrance Stairs-Rails: Right, Left  Entrance Stairs-Number of Steps: 4-5  PT Home environment access/layout    OT Lack of/limited family support    SLP      SW Decreased caregiver support, Lack  of/limited family support, Community education officer for SNF coverage     Team Discharge Planning: Destination: PT-Home ,OT- Home , SLP-Home Projected Follow-up: PT-Home health PT, OT-  Home health OT, SLP-  Projected Equipment Needs: PT-To be determined, OT- To be determined, SLP-None recommended by SLP Equipment Details: PT-pt has rollator and w/c., OT-  Patient/family involved in discharge planning: PT- Patient,  Family member/caregiver,  OT-Patient, Family member/caregiver, SLP-Patient, Family member/caregiver  MD ELOS: 10-15 days Medical Rehab Prognosis:  Good Assessment: The patient has been admitted for CIR therapies with the diagnosis of  left thalamic capsular infarct . The team will be addressing functional mobility, strength, stamina, balance, safety, adaptive techniques and equipment, self-care, bowel and bladder mgt, patient and caregiver education, . Goals have been set at supervision. Anticipated discharge destination is home.       See Team Conference Notes for weekly updates to the plan of care

## 2023-11-10 NOTE — Care Management (Signed)
 Inpatient Rehabilitation Center Individual Statement of Services  Patient Name:  Courtney Cline  Date:  11/10/2023  Welcome to the Inpatient Rehabilitation Center.  Our goal is to provide you with an individualized program based on your diagnosis and situation, designed to meet your specific needs.  With this comprehensive rehabilitation program, you will be expected to participate in at least 3 hours of rehabilitation therapies Monday-Friday, with modified therapy programming on the weekends.  Your rehabilitation program will include the following services:  Physical Therapy (PT), Occupational Therapy (OT), Speech Therapy (ST), 24 hour per day rehabilitation nursing, Therapeutic Recreaction (TR), Psychology, Neuropsychology, Care Coordinator, Rehabilitation Medicine, Nutrition Services, Pharmacy Services, and Other  Weekly team conferences will be held on [employer] to discuss your progress.  Your Inpatient Rehabilitation Care Coordinator will talk with you frequently to get your input and to update you on team discussions.  Team conferences with you and your family in attendance may also be held.  Expected length of stay: 10-14 days    Overall anticipated outcome: Supervision  Depending on your progress and recovery, your program may change. Your Inpatient Rehabilitation Care Coordinator will coordinate services and will keep you informed of any changes. Your Inpatient Rehabilitation Care Coordinator's name and contact numbers are listed  below.  The following services may also be recommended but are not provided by the Inpatient Rehabilitation Center:  Driving Evaluations Home Health Rehabiltiation Services Outpatient Rehabilitation Services Vocational Rehabilitation   Arrangements will be made to provide these services after discharge if needed.  Arrangements include referral to agencies that provide these services.  Your insurance has been verified to be:  [employer]  Your primary doctor is:  RUPASHREE ELLIOT  Pertinent information will be shared with your doctor and your insurance company.  Inpatient Rehabilitation Care Coordinator:  Graeme Jude, KEN [phone] or [phone]  Information discussed with and copy given to patient by: Graeme DELENA Jude, 11/10/2023, 10:22 AM

## 2023-11-10 NOTE — Progress Notes (Signed)
 PROGRESS NOTE   Subjective/Complaints:  No events overnight.  No acute complaints.    Vital stable, heart rate much better this a.m. blood pressure significantly improved with medication adjustments and IV fluids yesterday, 140s over 50s today.  Remains on 4 L nasal cannula, satting 98 to 100%.  P.o. intakes 30 to 50% of meals.  Family endorses a few months ago she got p.o. antibiotics, and since then has had very little taste and no appetite.  Patient denies nausea, constipation, or food aversions.  Mostly continent of urine, low PVR.  Last BM 9/22, smear   ROS: Denies fevers, chills, N/V, abdominal pain, constipation, diarrhea, SOB, cough, chest pain, new weakness or paraesthesias.   Poor appetite  Objective:   No results found. Recent Labs    11/09/23 0514  WBC 6.8  HGB 9.1*  HCT 30.5*  PLT 283   Recent Labs    11/09/23 0514  NA 140  K 4.2  CL 106  CO2 29  GLUCOSE 86  BUN 19  CREATININE 1.42*  CALCIUM  8.7*    Intake/Output Summary (Last 24 hours) at 11/10/2023 1005 Last data filed at 11/09/2023 1300 Gross per 24 hour  Intake 100 ml  Output --  Net 100 ml        Physical Exam: Vital Signs Blood pressure (!) 142/54, pulse 76, temperature 98.6 F (37 C), temperature source Oral, resp. rate (!) 22, height 5' 2 (1.575 m), weight 63 kg, SpO2 98%. Constitutional: No apparent distress. Appropriate appearance for age.  Sitting upright in wheelchair. HENT: No JVD. Neck Supple. Trachea midline. Atraumatic, normocephalic. Eyes: PERRLA. EOMI. Visual fields grossly intact.  Cardiovascular: RRR, no murmurs/rub/gallops. No Edema. Peripheral pulses 2+  Respiratory: CTAB. No rales, rhonchi, or wheezing. On RA.  On 3 L nasal cannula--stable Abdomen: + bowel sounds, hypoactive. No distention or tenderness.  GU: Not examined. Skin: C/D/I. No apparent lesions.  Multiple bruises on bilateral upper extremities.  Skin  turgor very poor. MSK:      No apparent deformity.   Neurologic exam:  Cognition: AAO to person, place, time  Language: Fluent, No substitutions or neoglisms. No dysarthria.  Memory: Mild deficits apparent Insight: Good  insight into current condition.  Mood: Pleasant affect, appropriate mood.  Sensation: Equal and intact in BL UE and Les.  Reflexes: 2+ in BL UE and LEs. Negative Hoffman's and babinski signs bilaterally.  CN: 2-12 grossly intact.  Coordination: No apparent tremors. No ataxia on FTN, HTS bilaterally.  Spasticity: MAS 0 in all extremities.    Physical exam unchanged from the above on reexamination 11/10/23    Assessment/Plan: 1. Functional deficits which require 3+ hours per day of interdisciplinary therapy in a comprehensive inpatient rehab setting. Physiatrist is providing close team supervision and 24 hour management of active medical problems listed below. Physiatrist and rehab team continue to assess barriers to discharge/monitor patient progress toward functional and medical goals  Care Tool:  Bathing  Bathing activity did not occur: Safety/medical concerns (Limited eval due to elevated/variable HR (67-135 bpm).)           Bathing assist       Upper Body Dressing/Undressing Upper body dressing  Upper body dressing/undressing activity did not occur (including orthotics): Safety/medical concerns (Limited eval due to elevated/variable HR (67-135 bpm).)      Upper body assist      Lower Body Dressing/Undressing Lower body dressing    Lower body dressing activity did not occur: Safety/medical concerns (Limited eval due to elevated/variable HR (67-135 bpm).)       Lower body assist       Toileting Toileting    Toileting assist Assist for toileting: 2 Helpers     Transfers Chair/bed transfer  Transfers assist  Chair/bed transfer activity did not occur: Safety/medical concerns        Locomotion Ambulation   Ambulation assist    Ambulation activity did not occur: Safety/medical concerns          Walk 10 feet activity   Assist  Walk 10 feet activity did not occur: Safety/medical concerns        Walk 50 feet activity   Assist Walk 50 feet with 2 turns activity did not occur: Safety/medical concerns         Walk 150 feet activity   Assist Walk 150 feet activity did not occur: Safety/medical concerns         Walk 10 feet on uneven surface  activity   Assist Walk 10 feet on uneven surfaces activity did not occur: Safety/medical concerns         Wheelchair     Assist Is the patient using a wheelchair?: Yes Type of Wheelchair: Manual Wheelchair activity did not occur: Safety/medical concerns         Wheelchair 50 feet with 2 turns activity    Assist    Wheelchair 50 feet with 2 turns activity did not occur: Safety/medical concerns       Wheelchair 150 feet activity     Assist  Wheelchair 150 feet activity did not occur: Safety/medical concerns       Blood pressure (!) 142/54, pulse 76, temperature 98.6 F (37 C), temperature source Oral, resp. rate (!) 22, height 5' 2 (1.575 m), weight 63 kg, SpO2 98%.  Medical Problem List and Plan: 1. Functional deficits secondary to left thalamic capsular infarct             -patient may shower             -ELOS/Goals: 10-14 days S             -Stable to continue CIR   2.  Antithrombotics: -DVT/anticoagulation:  Mechanical: Sequential compression devices, below knee Bilateral lower extremities Pharmaceutical: Eliquis              -antiplatelet therapy: Eliquis  2.5 mg twice daily 3. Pain Management: Tylenol  as needed 4. Mood/Behavior/Sleep: LCSW to follow for evaluation and support when available.              -antipsychotic agents: [name]             - Continue melatonin 3 mg as needed at bedtime 5. Neuropsych/cognition: This patient is capable of making decisions on her own behalf. 6. Skin/Wound Care: Routine pressure  relief measures 7. Fluids/Electrolytes/Nutrition: Monitor I&O Daily weights with follow-up labs.             - SLP following; on DYS 3   - 9/26: Poor p.o. intakes for several months per family due to lack of appetite since finishing an antibiotic.  Nutrition consulted for recommendations, will start mirtazapine  7.5 mg nightly for appetite.   8.  A-fib: History  of noncompliance with Eliquis .              - Resumed Eliquis              - continue Metoprolol  25 mg nightly for rate control, currently stable  - 9/25: A-fib with RVR into the 150s with mobilization this a.m.  Got additional dose of metoprolol  this a.m. for coverage.  Cardiology consulted, appreciate recommendations  9-26: Cardiology switched from long-acting metoprolol  to Lopressor  25 mg twice daily; patient is tolerating this much better.  DC IV fluids.   9.  HTN/HFpEF: continue hydralazine  25 mg twice daily and metoprolol              - Monitor blood pressures with increased activity  - 9-25: Hypotensive into the 80s with mobilization; hold hydralazine , continue metoprolol  for rate control, will give 1 L IV fluid today.  Cardiology consult pending.  - 9/26: Blood pressure much better today, 140s over 50s.  Daily weight stable. DC IV fluids, monitor.       11/10/2023    7:49 AM 11/10/2023    4:40 AM 11/10/2023    3:31 AM  Vitals with BMI  Weight  138 lbs 14 oz   BMI  25.4   Systolic   142  Diastolic   54  Pulse 76  66    Filed Weights   11/08/23 1713 11/09/23 0519 11/10/23 0440  Weight: 63.6 kg 61.8 kg 63 kg    10.  HLD: Crestor  transition to Lipitor  80 mg   11.  AKI: Baseline creatinine 1.2-1.5. Required IV fluids.  Currently 1.51    - 9-25: Slow improvement, creatinine 1.4.  IV fluids today as above.   - Repeat Monday  12.  COPD:  continue Breztri , DuoNeb as needed             - Incentive spirometer   13. Constipation: last BM 9/22, add senna HS  - 9/25: add daily miralax    - 9/26: Give sorbitol  today    LOS: 2  days A FACE TO FACE EVALUATION WAS PERFORMED  Joesph JAYSON Likes 11/10/2023, 10:05 AM

## 2023-11-10 NOTE — Progress Notes (Signed)
 Patient with epitaxies. Dan PA notified. Humidified O2. Ocean spray nasal spay given. Minimal drainage.    Nat Hacker LPN

## 2023-11-10 NOTE — Progress Notes (Signed)
 Physical Therapy Session Note  Patient Details  Name: Courtney Cline MRN: [id number] Date of Birth: Mar 26, 1933  Today's Date: 11/10/2023 PT Individual Time: 8896-8794 + 8693-8584 PT Individual Time Calculation (min): 62 min + 69 min  Short Term Goals: Week 1:  PT Short Term Goal 1 (Week 1): Pt will transfer sup to sit w/ min A consistently. PT Short Term Goal 2 (Week 1): Pt will transfer st to stand w/ min A consistently. PT Short Term Goal 3 (Week 1): PT to assess gait PT Short Term Goal 4 (Week 1): Pt to assess stairs.  Skilled Therapeutic Interventions/Progress Updates:    SESSION 1: Pt presents in room in recliner, agreeable to PT. Pt denies pain at this time. Session focused on therapeutic activities for energy conservation and vitals management and upright tolerance with BLE muscle fiber recruitment needed for functional ambulation and transfers, as well as gait training with RW. Pt HR noted to be 62bpm at start of session, SpO2 95% on 4L at rest. Pt completes transfers from recliner with mod assist for anterior wt shift and gluteal clearance. Pt completes ambulatory transfer to [facility] with mod assist without device demonstrating increased R lateral lean and difficulty advancing RLE. Pt transported to day room, then provided with alternate WC for improved seating. Pt completes sit to stand from [facility] with cues for BUE hand placement on armrests, improves transfer to min assist to stand to RW. Pt ambulates with RW 2x40' with min assist and +2 WC follow for fatigue and safety, pt with occasional R lateral lean and decreased RLE foot clearance improves with repetition and cueing. Pt requires extended seated rest break between gait trials due to pt reporting SOB, pt SpO2 100% on 4L and HR 71bpm. Cardiologist present during session and discusses medication changes with pt with good result on HR management. Pt takes extended rest break then completes standing therex with BUE support on RW x2 as  therapeutic activity to promote improve upright tolerance, balance, and BLE strengthening including standing marches x20 and heel raise x10, requires cues for sequencing. Pt returns to room and completes ambulatory transfer min assist to recliner, remains seated in recliner with all needs within reach, cal light in place at end of session.  SESSION 2: Pt presents in room in recliner, agreeable to PT. Pt denies pain however does endorse SOB. Session focused on therapeutic activities to promote activity tolerance as well as NMR for dynamic standing balance, midline orientation, and BLE muscle fiber recruitment. Pt completes sit to stand from recliner to RW with min assist improved hand placement without cueing demonstrating carryover from previous session. Pt completes ambulatory transfer with RW min assist to [facility] H[facility], requires mod assist for stand to sit with uncontrolled descent to [facility]. Pt transported to day room and set up on kinetron while seated in WC, pt completes interval training with BLEs 30sec work/30sec rest for 10 min at 40 cm/sec resistance. Pt completes standing therex with BUE support as NMR to promote BLE muscle fiber recruitment, midline orientation, dynamic standing balance including: - step taps 4 step 2x10 BLE (mirror positioned in front of pt as visual cue to correct R lateral lean) - step ups 4 step x5 BLE - forward/backward walking 2x5' Pt provided with seated rest breaks between all gait trials and exercises to promote energy conservation and quality with tasks. Pt returns to room and completes ambulatory transfer to recliner with min assist with RW, remains seated in recliner with all needs within reach, cal  light in place and chair alarm donned and activated at end of session.    Therapy Documentation Precautions:  Precautions Precautions: Fall Recall of Precautions/Restrictions: Impaired Precaution/Restrictions Comments: Watch HR and BP; 4L O2 [place] Restrictions Weight Bearing  Restrictions Per Provider Order: No    Therapy/Group: Individual Therapy  Reche Ohara PT, DPT 11/10/2023, 1:01 PM

## 2023-11-11 DIAGNOSIS — K5901 Slow transit constipation: Secondary | ICD-10-CM

## 2023-11-11 DIAGNOSIS — I10 Essential (primary) hypertension: Secondary | ICD-10-CM

## 2023-11-11 NOTE — Progress Notes (Signed)
 Physical Therapy Session Note  Patient Details  Name: Courtney Cline MRN: [id number] Date of Birth: Sep 25, 1933  Today's Date: 11/11/2023 PT Individual Time: 8963-8881 PT Individual Time Calculation (min): 42 min   Short Term Goals: Week 1:  PT Short Term Goal 1 (Week 1): Pt will transfer sup to sit w/ min A consistently. PT Short Term Goal 2 (Week 1): Pt will transfer st to stand w/ min A consistently. PT Short Term Goal 3 (Week 1): PT to assess gait PT Short Term Goal 4 (Week 1): Pt to assess stairs.  Skilled Therapeutic Interventions/Progress Updates: Patient semi-reclined in bed on entrance to room.  Patient reported fall with nsg earlier in morning with laceration on R lower leg (already bandaged by nsg), and increased worry about standing activities due to fear of falling. Pt's son was presented and expressed further concern on fall. PTA communicated with nsg with charge nsg arriving in room shortly after to discuss the situation with pt and pt son. PTA provided active listening and encouragement to pt with pt agreeing to ambulate in day room gym. Pt maxA to thread personal pants through B LE's up to knees with cues for pt to donn around waist while performing bridge (PTA stabilizing B LE's). Pt then sat to EOB from semi-reclined position with minA and cues to anteriorly scoot to EOB with B UE's assisting (CGA). PTA donned pt's personal long sleeve shirt over gown to protect pt's modesty. Pt performed transfers with RW throughout session with CGA/light minA and cues to increase upright standing posture and hand placement. Pt transported to day room gym with 4L O2 on portable tank. Pt ambulated 52' in day room with pt's son following in [facility] for safety. Pt cued throughout to inhale through B nostrils to increase O2 saturation. Pt with 2 short standing rest breaks. Pt transported back to room in [facility].  Patient sitting in WC at end of session with brakes locked, son present, chair alarm set,  and all needs within reach.      Therapy Documentation Precautions:  Precautions Precautions: Fall Recall of Precautions/Restrictions: Impaired Precaution/Restrictions Comments: Watch HR and BP; 4L O2 [place] Restrictions Weight Bearing Restrictions Per Provider Order: No  Therapy/Group: Individual Therapy  Senai Ramnath PTA 11/11/2023, 12:13 PM

## 2023-11-11 NOTE — Plan of Care (Signed)
  Problem: Consults Goal: RH STROKE PATIENT EDUCATION Description: See Patient Education module for education specifics  Outcome: Progressing   Problem: RH BOWEL ELIMINATION Goal: RH STG MANAGE BOWEL WITH ASSISTANCE Description: STG Manage Bowel with supervision-minimal Assistance. Outcome: Progressing   Problem: RH BLADDER ELIMINATION Goal: RH STG MANAGE BLADDER WITH ASSISTANCE Description: STG Manage Bladder With min- supervision Assistance Outcome: Progressing   Problem: RH SKIN INTEGRITY Goal: RH STG SKIN FREE OF INFECTION/BREAKDOWN Description: Manage skin free of infection/breakdown with min- supervision assistance  Outcome: Progressing   Problem: RH SAFETY Goal: RH STG ADHERE TO SAFETY PRECAUTIONS W/ASSISTANCE/DEVICE Description: STG Adhere to Safety Precautions With min- supervision Assistance/Device. Outcome: Progressing   Problem: RH PAIN MANAGEMENT Goal: RH STG PAIN MANAGED AT OR BELOW PT'S PAIN GOAL Description: <4 w/ prns Outcome: Progressing   Problem: RH KNOWLEDGE DEFICIT Goal: RH STG INCREASE KNOWLEDGE OF HYPERTENSION Description: Manage increase knowledge of hypertension with min- supervision assistance from sons using educational materials provided Outcome: Progressing Goal: RH STG INCREASE KNOWLEDGE OF DYSPHAGIA/FLUID INTAKE Description: Manage increase knowledge of dysphagia/fluid intake with min- supervision assistance from sons using educational materials provided Outcome: Progressing Goal: RH STG INCREASE KNOWLEGDE OF HYPERLIPIDEMIA Description: Manage increase knowledge of hyperlipidemia with min- supervision assistance from sons using educational materials provided Outcome: Progressing Goal: RH STG INCREASE KNOWLEDGE OF STROKE PROPHYLAXIS Description: Manage increase knowledge of stroke prophylaxis with min- supervision assistance from sons using educational materials provided Outcome: Progressing

## 2023-11-11 NOTE — Progress Notes (Signed)
   11/11/23 0800  What Happened  Was fall witnessed? Yes  Who witnessed fall? NT  Patients activity before fall ambulating-assisted (toileting)  Point of contact other (comment) (Bed)  Was patient injured? Yes  Provider Notification  Provider Name/Title Dr. Lorilee  Date Provider Notified 11/11/23  Time Provider Notified 0800  Method of Notification Call  Notification Reason Fall  Provider response Evaluate remotely  Date of Provider Response 11/11/23  Time of Provider Response 0800  Follow Up  Family notified Yes - comment  Time family notified 0800  Additional tests Yes-comment  Simple treatment Dressing  Progress note created (see row info) Yes  Adult Fall Risk Assessment  Risk Factor Category (scoring not indicated) High fall risk per protocol (document High fall risk)  Patient Fall Risk Level High fall risk  Adult Fall Risk Interventions  Required Bundle Interventions *See Row Information* High fall risk - low, moderate, and high requirements implemented  Additional Interventions Use of appropriate toileting equipment (bedpan, BSC, etc.)  Fall intervention(s) refused/Patient educated regarding refusal Bed alarm;Nonskid socks;Open door if unsupervised;Yellow bracelet  Screening for Fall Injury Risk (To be completed on HIGH fall risk patients) - Assessing Need for Floor Mats  Risk For Fall Injury- Criteria for Floor Mats 85 years or older  Will Implement Floor Mats Yes  Vitals  Temp 98.1 F (36.7 C)  Temp Source Oral  BP (!) 154/60  MAP (mmHg) 87  BP Location Right Arm  BP Method Automatic  Patient Position (if appropriate) Lying  Pulse Rate 78  Pulse Rate Source Monitor  Resp 16  Oxygen  Therapy  SpO2 97 %  O2 Device Nasal Cannula  O2 Flow Rate (L/min) 4 L/min  Pain Assessment  Pain Scale 0-10  Pain Score 10  PCA/Epidural/Spinal Assessment  Respiratory Pattern Regular  Neurological  Neuro (WDL) X  Level of Consciousness Alert  Orientation Level Oriented X4   Cognition Appropriate at baseline  Speech Clear  Motor Function/Sensation Assessment Grip;Motor response  R Hand Grip Moderate  L Hand Grip Moderate  RUE Motor Response Purposeful movement  LUE Motor Response Purposeful movement  RLE Motor Response Purposeful movement  LLE Motor Response Purposeful movement  Neuro Symptoms None  Musculoskeletal  Musculoskeletal (WDL) X  Assistive Device BSC  Generalized Weakness Yes  Weight Bearing Restrictions Per Provider Order No  Integumentary  Integumentary (WDL) X  Skin Color Appropriate for ethnicity  Skin Condition Dry  Skin Integrity Ecchymosis;Other (Comment) (Skin tear)  Ecchymosis Location Arm;Leg  Ecchymosis Location Orientation Bilateral  Erythema/Redness Location Breast  Erythema/Redness Location Orientation Bilateral  Skin Turgor Non-tenting

## 2023-11-11 NOTE — Progress Notes (Signed)
 The patient was toileting with the assistance of a Nurse Tech (NT). Upon standing, the patient inadvertently stepped on her tubing, causing a loss of balance. As a result, the patient fell onto the bed, with her lower extremities sliding toward the floor. She was then safely lowered to the floor by staff.  During this incident, a pre-existing bruise on the patient's left leg was noted to have opened, resulting in a skin tear. The physician, Dr. Lorilee is aware and as well as the patient's family, were promptly notified of the incident.  New orders placed.  Geni Armor, LPN

## 2023-11-11 NOTE — Progress Notes (Signed)
 Occupational Therapy Session Note  Patient Details  Name: Courtney Cline MRN: [id number] Date of Birth: [birth year]  Today's Date: 11/11/2023 OT Individual Time: 9154-9054 OT Individual Time Calculation (min): 60 min    Short Term Goals: Week 1:  OT Short Term Goal 1 (Week 1): Pt will perform toilet transfers with Min A + LRAD. OT Short Term Goal 2 (Week 1): Pt will tolerate >2 mins of standing with Min A + LRAD, in preparation for standing ADLs. OT Short Term Goal 3 (Week 1): Pt will thread LB garments with Min A.  Skilled Therapeutic Interventions/Progress Updates:     Patient supine in bed at the time of arrival finishing up breakfast. The pt indicated that she felt okay with a pain response of 5 on 0-10 for head ache. The pt was in agreement with completing a bathing exercise at bed LOF.  The pt presents with BP of 133/49 HR of 74, and 02 saturation of 100% and Temp of 97.5.  The pt was able to bathe UB with s/uA. The pt was MinA with LB bathing of front peri area, she was MaxA for cleaning her bottom. The pt was MinA for donning her hospital gown and Dep for donning her non-skid socks. The pt was able to transfer from supine to L and R for donning her brief incorporating the bed rails at CGA. The pt was Dep for donning her brief. The nursing was informed of red areas under breast and in the folds of the pt legs, nursing indicated that she was putting in  an order ior nystatin .  Prior to exiting the room, the call light and bedside table were put in place with the safety alarm activated.  Therapy Documentation Precautions:  Precautions Precautions: Fall Recall of Precautions/Restrictions: Impaired Precaution/Restrictions Comments: Watch HR and BP; 4L O2 [place] Restrictions Weight Bearing Restrictions Per Provider Order: No   Therapy/Group: Individual TherapyP  [name] 11/11/2023, 4:15 PM

## 2023-11-11 NOTE — Progress Notes (Signed)
 Physical Therapy Session Note  Patient Details  Name: Courtney Cline MRN: [id number] Date of Birth: October 20, 1933  Today's Date: 11/11/2023 PT Individual Time: 1036 - 1118 PT Individual Time Calculation (min): 42 min PT Individual Time: 8692-8651 PT Individual Time Calculation (min): 41 min   Short Term Goals: Week 1:  PT Short Term Goal 1 (Week 1): Pt will transfer sup to sit w/ min A consistently. PT Short Term Goal 2 (Week 1): Pt will transfer st to stand w/ min A consistently. PT Short Term Goal 3 (Week 1): PT to assess gait PT Short Term Goal 4 (Week 1): Pt to assess stairs.  Skilled Therapeutic Interventions/Progress Updates: Patient sitting in [facility] with son present on entrance to room. Patient alert and agreeable to PT session.   Patient reported no pain during session, only some fatigue.   Therapeutic Activity: Bed Mobility: Pt performed sit<supine on EOB with minA and VC to self adjust to center of bed via bridge.  Transfers: Pt performed sit<>stand transfers throughout session with CGA the transitioned to min/heavy minA towards end of session due to fatigue and increase in posterior weight shift. Provided VC for hand placement and anterior scoot.  - Pt ambulated 40' x , 37' x 2 with CGA mostly, then light minA towards last few feet due to pt presentation of fatigue. Pt cued throughout to increase B nostril inhalation and to increase upright posture vs downward gaze. Pt son following in [facility] with O2 tank (4L) with O2 monitored once at 100% - Pt performed step to 4 aerobic step B LE's with HHA and overall min/heavy minA with cues to increase weight as well as maintaining upright posture.  Patient supine in bed at end of session with brakes locked, son present, bed alarm set, and all needs within reach.     Therapy Documentation Precautions:  Precautions Precautions: Fall Recall of Precautions/Restrictions: Impaired Precaution/Restrictions Comments: Watch HR and BP; 4L O2  [place] Restrictions Weight Bearing Restrictions Per Provider Order: No  Therapy/Group: Individual Therapy  Loki Wuthrich PTA 11/11/2023, 3:51 PM

## 2023-11-11 NOTE — Progress Notes (Signed)
 PROGRESS NOTE   Subjective/Complaints:  Pt doing ok but just had a witnessed fall, apparently stepped on O2 tubing and was assisted down by nursing (went onto bed before going down). Small skin tear to RLE, no other injuries observed or reported by pt.  Slept well overnight, denies pain other than at the skin tear. LBM last night many times after sorbitol . Urinating fine.  No other complaints or concerns.    ROS: Denies fevers, chills, N/V, abdominal pain, constipation, diarrhea, SOB, cough, chest pain, new weakness or paraesthesias.   Poor appetite  Objective:   No results found. Recent Labs    11/09/23 0514  WBC 6.8  HGB 9.1*  HCT 30.5*  PLT 283   Recent Labs    11/09/23 0514  NA 140  K 4.2  CL 106  CO2 29  GLUCOSE 86  BUN 19  CREATININE 1.42*  CALCIUM  8.7*    Intake/Output Summary (Last 24 hours) at 11/11/2023 1105 Last data filed at 11/10/2023 1900 Gross per 24 hour  Intake 356 ml  Output --  Net 356 ml        Physical Exam: Vital Signs Blood pressure (!) 133/49, pulse 74, temperature (!) 97.5 F (36.4 C), temperature source Oral, resp. rate 16, height 5' 2 (1.575 m), weight 62.8 kg, SpO2 98%.  Constitutional: No apparent distress. Appropriate appearance for age.  Sitting upright in bed. HENT: No JVD. Neck Supple. Trachea midline. Atraumatic, normocephalic. Eyes: PERRLA. EOMI. Visual fields grossly intact.  Cardiovascular: RRR, no murmurs/rub/gallops. No Edema. Peripheral pulses 2+  Respiratory: globally diminished breath sounds likely chronic, no rales, rhonchi, or wheezing.  On 3 L nasal cannula--stable Abdomen: + bowel sounds, normoactive. No distention or tenderness. Soft.  Skin: skin tear RLE covered with mepilex. Otherwise, skin C/D/I. No apparent lesions otherwise.  Multiple bruises on bilateral upper extremities.  Skin turgor very poor. MSK: No apparent deformity.  PRIOR EXAMS: Neurologic  exam:  Cognition: AAO to person, place, time  Language: Fluent, No substitutions or neoglisms. No dysarthria.  Memory: Mild deficits apparent Insight: Good  insight into current condition.  Mood: Pleasant affect, appropriate mood.  Sensation: Equal and intact in BL UE and Les.  Reflexes: 2+ in BL UE and LEs. Negative Hoffman's and babinski signs bilaterally.  CN: 2-12 grossly intact.  Coordination: No apparent tremors. No ataxia on FTN, HTS bilaterally.  Spasticity: MAS 0 in all extremities.    Physical exam unchanged from the above on reexamination 11/11/23    Assessment/Plan: 1. Functional deficits which require 3+ hours per day of interdisciplinary therapy in a comprehensive inpatient rehab setting. Physiatrist is providing close team supervision and 24 hour management of active medical problems listed below. Physiatrist and rehab team continue to assess barriers to discharge/monitor patient progress toward functional and medical goals  Care Tool:  Bathing  Bathing activity did not occur: Safety/medical concerns (Limited eval due to elevated/variable HR (67-135 bpm).)           Bathing assist       Upper Body Dressing/Undressing Upper body dressing Upper body dressing/undressing activity did not occur (including orthotics): Safety/medical concerns (Limited eval due to elevated/variable HR (67-135 bpm).)  Upper body assist      Lower Body Dressing/Undressing Lower body dressing    Lower body dressing activity did not occur: Safety/medical concerns (Limited eval due to elevated/variable HR (67-135 bpm).)       Lower body assist       Toileting Toileting    Toileting assist Assist for toileting: 2 Helpers     Transfers Chair/bed transfer  Transfers assist  Chair/bed transfer activity did not occur: Safety/medical concerns  Chair/bed transfer assist level: Moderate Assistance - Patient 50 - 74%     Locomotion Ambulation   Ambulation assist    Ambulation activity did not occur: Safety/medical concerns          Walk 10 feet activity   Assist  Walk 10 feet activity did not occur: Safety/medical concerns        Walk 50 feet activity   Assist Walk 50 feet with 2 turns activity did not occur: Safety/medical concerns         Walk 150 feet activity   Assist Walk 150 feet activity did not occur: Safety/medical concerns         Walk 10 feet on uneven surface  activity   Assist Walk 10 feet on uneven surfaces activity did not occur: Safety/medical concerns         Wheelchair     Assist Is the patient using a wheelchair?: Yes Type of Wheelchair: Manual Wheelchair activity did not occur: Safety/medical concerns         Wheelchair 50 feet with 2 turns activity    Assist    Wheelchair 50 feet with 2 turns activity did not occur: Safety/medical concerns       Wheelchair 150 feet activity     Assist  Wheelchair 150 feet activity did not occur: Safety/medical concerns       Blood pressure (!) 133/49, pulse 74, temperature (!) 97.5 F (36.4 C), temperature source Oral, resp. rate 16, height 5' 2 (1.575 m), weight 62.8 kg, SpO2 98%.  Medical Problem List and Plan: 1. Functional deficits secondary to left thalamic capsular infarct             -patient may shower             -ELOS/Goals: 10-14 days S             -Stable to continue CIR   2.  Antithrombotics: -DVT/anticoagulation:  Mechanical: Sequential compression devices, below knee Bilateral lower extremities Pharmaceutical: Eliquis              -antiplatelet therapy: Eliquis  2.5 mg twice daily 3. Pain Management: Tylenol  as needed 4. Mood/Behavior/Sleep: LCSW to follow for evaluation and support when available.              -antipsychotic agents: [name]             - Continue melatonin 3 mg as needed at bedtime, also has trazodone  PRN 5. Neuropsych/cognition: This patient is capable of making decisions on her own behalf. 6.  Skin/Wound Care: Routine pressure relief measures 7. Fluids/Electrolytes/Nutrition: Monitor I&O Daily weights with follow-up labs. Continue ensure. Continue vitamins/supplements.              - SLP following; on DYS 3-- now DYS 2 - 9/26: Poor p.o. intakes for several months per family due to lack of appetite since finishing an antibiotic.  Nutrition consulted for recommendations, will start mirtazapine  7.5 mg nightly for appetite.     8.  A-fib: History of noncompliance with  Eliquis .              - Resumed Eliquis              - continue Metoprolol  25 mg nightly for rate control, currently stable - 9/25: A-fib with RVR into the 150s with mobilization this a.m.  Got additional dose of metoprolol  this a.m. for coverage.  Cardiology consulted, appreciate recommendations 9-26: Cardiology switched from long-acting metoprolol  to Lopressor  25 mg twice daily; patient is tolerating this much better.  DC IV fluids. -11/11/23 rate controlled, doing well on this regimen   9.  HTN/HFpEF: continue hydralazine  25 mg twice daily and metoprolol              - Monitor blood pressures with increased activity - 9-25: Hypotensive into the 80s with mobilization; hold hydralazine , continue metoprolol  for rate control, will give 1 L IV fluid today.  Cardiology consult pending. - 9/26: Blood pressure much better today, 140s over 50s.  Daily weight stable. DC IV fluids, monitor.    -11/11/23 BPs improving, wt stable, monitor.   Vitals:   11/09/23 1254 11/09/23 1422 11/09/23 1531 11/09/23 1925  BP: 96/73 (!) 121/54 (!) 128/58 (!) 124/50   11/09/23 2325 11/10/23 0331 11/10/23 1442 11/10/23 1928  BP: (!) 141/57 (!) 142/54 (!) 152/48 (!) 135/31   11/10/23 2010 11/11/23 0349 11/11/23 0800 11/11/23 0900  BP: (!) 135/31 (!) 103/58 (!) 154/60 (!) 133/49    Filed Weights   11/09/23 0519 11/10/23 0440 11/11/23 0517  Weight: 61.8 kg 63 kg 62.8 kg    10.  HLD: Crestor  transition to Lipitor  80 mg   11.  AKI: Baseline  creatinine 1.2-1.5. Required IV fluids.  Currently 1.51    - 9-25: Slow improvement, creatinine 1.4.  IV fluids today as above.   - Repeat Monday  12.  COPD:  continue Breztri , DuoNeb as needed             - Incentive spirometer   13. Constipation: last BM 9/22, add senna HS  - 9/25: add daily miralax    - 9/26: Give sorbitol  today -11/11/23 multiple BMs with sorbitol  (would be cautious in the future), cont regimen but monitor if still having BMs too often after clean out.     LOS: 3 days A FACE TO FACE EVALUATION WAS PERFORMED  [address] 11/11/2023, 11:05 AM

## 2023-11-11 NOTE — Progress Notes (Signed)
 Speech Language Pathology Daily Session Note  Patient Details  Name: Courtney Cline MRN: [id number] Date of Birth: 1933/07/20  Today's Date: 11/11/2023 SLP Individual Time: 9181-9154 SLP Individual Time Calculation (min): 27 min  Short Term Goals: Week 1: SLP Short Term Goal 1 (Week 1): Pt will solve mildly complex problems w/ supervisionA SLP Short Term Goal 2 (Week 1): Pt will complete mildly specific word finding tasks w/ supervisionA SLP Short Term Goal 3 (Week 1): Pt will tolerate the lest restrictive diet w/ supervisionA SLP Short Term Goal 4 (Week 1): Pt willl recall recent/relevant info w/ supervisionA  Skilled Therapeutic Interventions:  Patient was seen in am to address dysphagia management, language, and cognition. Pt was alert and seen at bedside. Direct handoff from nursing who was providing care. SLP provided set up A for breakfast meal consisting of D2 consistencies and thin liquids. Pt administered all trials. She did confirm difficulty consuming advanced consistency foods but also endorsed poor appetite. Pt consumed mixed consistency with cough observed x1 suspicious for possible airway invasion though no other instances noted. SLP instructed pt in safe swallowing strategies including upright positioning, oral hygiene BID, slow rate and small bites. Pt demonstrates awareness of limitations c/b reporting both walking and speech were different post CVA. Pt with some difficulty explaining differences in speech though does report it has gotten better. SLP introduced circumlocution strategy for word finding. She was challenged in completion of task with goal of using circumlocution strategy to convey a message to an unknowing listener. Pt utilized strategy given min A initially with cues faded to sup a as task progressed. SLP also addressing problem solving through challenging pt in identification of safety strategies in current environment. Pt located call button and identified x3  uses. She also reported recommendations to not stand indep and call for assist. Direct handoff to OT at conclusion of session. SLP to continue POC.   Pain  Patient reports mild pain in legs and oncoming headache.  Therapy/Group: Individual Therapy  [name] Fuss 11/11/2023, 8:39 AM

## 2023-11-12 NOTE — Progress Notes (Signed)
 PROGRESS NOTE   Subjective/Complaints:  Pt doing fine today. Slept well overnight, pain manageable. LBM yesterday/overnight. Urinating fine.  No other complaints or concerns.    ROS: Denies fevers, chills, N/V, abdominal pain, constipation, diarrhea, SOB, cough, chest pain, new weakness or paraesthesias.   Poor appetite  Objective:   No results found. No results for input(s): WBC, HGB, HCT, PLT in the last 72 hours.  No results for input(s): NA, K, CL, CO2, GLUCOSE, BUN, CREATININE, CALCIUM  in the last 72 hours.   Intake/Output Summary (Last 24 hours) at 11/12/2023 1021 Last data filed at 11/12/2023 0834 Gross per 24 hour  Intake 120 ml  Output --  Net 120 ml        Physical Exam: Vital Signs Blood pressure (!) 156/77, pulse 88, temperature 97.6 F (36.4 C), temperature source Oral, resp. rate 18, height 5' 2 (1.575 m), weight 64.3 kg, SpO2 99%.  Constitutional: No apparent distress. Appropriate appearance for age.  Resting comfortably in bed. HENT: No JVD. Neck Supple. Trachea midline. Atraumatic, normocephalic. Eyes: PERRLA. EOMI. Visual fields grossly intact.  Cardiovascular: RRR, no murmurs/rub/gallops. No Edema. Peripheral pulses 2+  Respiratory: globally diminished breath sounds likely chronic, no rales, rhonchi, or wheezing.  On 3 L nasal cannula--stable Abdomen: + bowel sounds, normoactive. No distention or tenderness. Soft.  Skin: skin tear RLE covered with tegaderm. Otherwise, skin C/D/I. No apparent lesions otherwise.  Multiple bruises on bilateral upper extremities.  Skin turgor very poor. MSK: No apparent deformity.  PRIOR EXAMS: Neurologic exam:  Cognition: AAO to person, place, time  Language: Fluent, No substitutions or neoglisms. No dysarthria.  Memory: Mild deficits apparent Insight: Good  insight into current condition.  Mood: Pleasant affect, appropriate mood.   Sensation: Equal and intact in BL UE and Les.  Reflexes: 2+ in BL UE and LEs. Negative Hoffman's and babinski signs bilaterally.  CN: 2-12 grossly intact.  Coordination: No apparent tremors. No ataxia on FTN, HTS bilaterally.  Spasticity: MAS 0 in all extremities.    Physical exam unchanged from the above on reexamination 11/12/23    Assessment/Plan: 1. Functional deficits which require 3+ hours per day of interdisciplinary therapy in a comprehensive inpatient rehab setting. Physiatrist is providing close team supervision and 24 hour management of active medical problems listed below. Physiatrist and rehab team continue to assess barriers to discharge/monitor patient progress toward functional and medical goals  Care Tool:  Bathing  Bathing activity did not occur: Safety/medical concerns (Limited eval due to elevated/variable HR (67-135 bpm).)           Bathing assist       Upper Body Dressing/Undressing Upper body dressing Upper body dressing/undressing activity did not occur (including orthotics): Safety/medical concerns (Limited eval due to elevated/variable HR (67-135 bpm).)      Upper body assist      Lower Body Dressing/Undressing Lower body dressing    Lower body dressing activity did not occur: Safety/medical concerns (Limited eval due to elevated/variable HR (67-135 bpm).)       Lower body assist       Toileting Toileting    Toileting assist Assist for toileting: 2 Helpers     Transfers  Chair/bed transfer  Transfers assist  Chair/bed transfer activity did not occur: Safety/medical concerns  Chair/bed transfer assist level: Moderate Assistance - Patient 50 - 74%     Locomotion Ambulation   Ambulation assist   Ambulation activity did not occur: Safety/medical concerns          Walk 10 feet activity   Assist  Walk 10 feet activity did not occur: Safety/medical concerns        Walk 50 feet activity   Assist Walk 50 feet with 2  turns activity did not occur: Safety/medical concerns         Walk 150 feet activity   Assist Walk 150 feet activity did not occur: Safety/medical concerns         Walk 10 feet on uneven surface  activity   Assist Walk 10 feet on uneven surfaces activity did not occur: Safety/medical concerns         Wheelchair     Assist Is the patient using a wheelchair?: Yes Type of Wheelchair: Manual Wheelchair activity did not occur: Safety/medical concerns         Wheelchair 50 feet with 2 turns activity    Assist    Wheelchair 50 feet with 2 turns activity did not occur: Safety/medical concerns       Wheelchair 150 feet activity     Assist  Wheelchair 150 feet activity did not occur: Safety/medical concerns       Blood pressure (!) 156/77, pulse 88, temperature 97.6 F (36.4 C), temperature source Oral, resp. rate 18, height 5' 2 (1.575 m), weight 64.3 kg, SpO2 99%.  Medical Problem List and Plan: 1. Functional deficits secondary to left thalamic capsular infarct             -patient may shower             -ELOS/Goals: 10-14 days S             -Stable to continue CIR   2.  Antithrombotics: -DVT/anticoagulation:  Mechanical: Sequential compression devices, below knee Bilateral lower extremities Pharmaceutical: Eliquis              -antiplatelet therapy: Eliquis  2.5 mg twice daily 3. Pain Management: Tylenol  as needed 4. Mood/Behavior/Sleep: LCSW to follow for evaluation and support when available.              -antipsychotic agents: [name]             - Continue melatonin 3 mg as needed at bedtime, also has trazodone  PRN 5. Neuropsych/cognition: This patient is capable of making decisions on her own behalf. 6. Skin/Wound Care: Routine pressure relief measures 7. Fluids/Electrolytes/Nutrition: Monitor I&O Daily weights with follow-up labs. Continue ensure. Continue vitamins/supplements.              - SLP following; on DYS 3-- now DYS 2 - 9/26: Poor  p.o. intakes for several months per family due to lack of appetite since finishing an antibiotic.  Nutrition consulted for recommendations, will start mirtazapine  7.5 mg nightly for appetite.     8.  A-fib: History of noncompliance with Eliquis .              - Resumed Eliquis              - continue Metoprolol  25 mg nightly for rate control, currently stable - 9/25: A-fib with RVR into the 150s with mobilization this a.m.  Got additional dose of metoprolol  this a.m. for coverage.  Cardiology consulted, appreciate recommendations  9-26: Cardiology switched from long-acting metoprolol  to Lopressor  25 mg twice daily; patient is tolerating this much better.  DC IV fluids. -11/11/23 rate controlled, doing well on this regimen   9.  HTN/HFpEF: continue hydralazine  25 mg twice daily and metoprolol              - Monitor blood pressures with increased activity - 9-25: Hypotensive into the 80s with mobilization; hold hydralazine , continue metoprolol  for rate control, will give 1 L IV fluid today.  Cardiology consult pending. - 9/26: Blood pressure much better today, 140s over 50s.  Daily weight stable. DC IV fluids, monitor.    -9/27-28/25 BPs improving, wt stable, monitor.   Vitals:   11/09/23 2325 11/10/23 0331 11/10/23 1442 11/10/23 1928  BP: (!) 141/57 (!) 142/54 (!) 152/48 (!) 135/31   11/10/23 2010 11/11/23 0349 11/11/23 0800 11/11/23 0900  BP: (!) 135/31 (!) 103/58 (!) 154/60 (!) 133/49   11/11/23 1443 11/11/23 2001 11/11/23 2027 11/12/23 0301  BP: 120/71 (!) 97/59 (!) 156/52 (!) 156/77    Filed Weights   11/10/23 0440 11/11/23 0517 11/12/23 0315  Weight: 63 kg 62.8 kg 64.3 kg    10.  HLD: Crestor  transition to Lipitor  80 mg   11.  AKI: Baseline creatinine 1.2-1.5. Required IV fluids.  Currently 1.51    - 9-25: Slow improvement, creatinine 1.4.  IV fluids today as above.   - Repeat Monday  12.  COPD:  continue Breztri , DuoNeb as needed             - Incentive spirometer   13.  Constipation: last BM 9/22, add senna HS  - 9/25: add daily miralax    - 9/26: Give sorbitol  today -11/11/23 multiple BMs with sorbitol  (would be cautious in the future), cont regimen but monitor if still having BMs too often after clean out. -11/12/23 LBM overnight, monitor.      LOS: 4 days A FACE TO FACE EVALUATION WAS PERFORMED  [id number] Morelia Cassells 11/12/2023, 10:21 AM

## 2023-11-13 LAB — CBC
HCT: 26.3 % — ABNORMAL LOW (ref 36.0–46.0)
Hemoglobin: 7.8 g/dL — ABNORMAL LOW (ref 12.0–15.0)
MCH: 28.8 pg (ref 26.0–34.0)
MCHC: 29.7 g/dL — ABNORMAL LOW (ref 30.0–36.0)
MCV: 97 fL (ref 80.0–100.0)
Platelets: 225 K/uL (ref 150–400)
RBC: 2.71 MIL/uL — ABNORMAL LOW (ref 3.87–5.11)
RDW: 14.1 % (ref 11.5–15.5)
WBC: 6.1 K/uL (ref 4.0–10.5)
nRBC: 0 % (ref 0.0–0.2)

## 2023-11-13 LAB — BASIC METABOLIC PANEL WITH GFR
Anion gap: 9 (ref 5–15)
BUN: 25 mg/dL — ABNORMAL HIGH (ref 8–23)
CO2: 26 mmol/L (ref 22–32)
Calcium: 8.6 mg/dL — ABNORMAL LOW (ref 8.9–10.3)
Chloride: 107 mmol/L (ref 98–111)
Creatinine, Ser: 1.46 mg/dL — ABNORMAL HIGH (ref 0.44–1.00)
GFR, Estimated: 34 mL/min — ABNORMAL LOW (ref >60)
Glucose, Bld: 76 mg/dL (ref 70–99)
Potassium: 4.3 mmol/L (ref 3.5–5.1)
Sodium: 142 mmol/L (ref 135–145)

## 2023-11-13 LAB — IRON AND TIBC
Iron: 34 ug/dL (ref 28–170)
Saturation Ratios: 22 % (ref 10.4–31.8)
TIBC: 157 ug/dL — ABNORMAL LOW (ref 250–450)
UIBC: 123 ug/dL

## 2023-11-13 LAB — FERRITIN: Ferritin: 116 ng/mL (ref 11–307)

## 2023-11-13 MED ORDER — MEGESTROL ACETATE 400 MG/10ML PO SUSP
200.0000 mg | Freq: Two times a day (BID) | ORAL | Status: DC
  Administered 2023-11-13 – 2023-11-23 (×21): 200 mg via ORAL
  Filled 2023-11-13 (×21): qty 10

## 2023-11-13 MED ORDER — PANTOPRAZOLE SODIUM 40 MG PO TBEC
40.0000 mg | DELAYED_RELEASE_TABLET | Freq: Two times a day (BID) | ORAL | Status: DC
  Administered 2023-11-13 – 2023-11-23 (×20): 40 mg via ORAL
  Filled 2023-11-13 (×20): qty 1

## 2023-11-13 MED ORDER — EPOETIN ALFA 10000 UNIT/ML IJ SOLN
10000.0000 [IU] | Freq: Once | INTRAMUSCULAR | Status: AC
  Administered 2023-11-13: 10000 [IU] via SUBCUTANEOUS
  Filled 2023-11-13: qty 1

## 2023-11-13 MED ORDER — FE FUM-VIT C-VIT B12-FA 460-60-0.01-1 MG PO CAPS
1.0000 | ORAL_CAPSULE | Freq: Every day | ORAL | Status: DC
  Administered 2023-11-13: 1 via ORAL
  Filled 2023-11-13: qty 1

## 2023-11-13 MED ORDER — SALINE SPRAY 0.65 % NA SOLN
1.0000 | Freq: Three times a day (TID) | NASAL | Status: DC
  Administered 2023-11-13 – 2023-11-23 (×24): 1 via NASAL
  Filled 2023-11-13 (×2): qty 44

## 2023-11-13 NOTE — Progress Notes (Signed)
 Met with patient and son to review current situation, team conference and plan care. Reviewed medications Eliquis  and importance of taking them as directed by MD. Reviewed on daily weights , bowel, bladder, skin. Set up my chart. Reviewed book given to patient. Continue to follow along to provide educational needs to facilitate preparation for discharge.

## 2023-11-13 NOTE — Progress Notes (Signed)
 Awaiting patient to have another bm for stool collection.  Geni Armor, LPN

## 2023-11-13 NOTE — Progress Notes (Signed)
 Speech Language Pathology Daily Session Note  Patient Details  Name: Courtney Cline MRN: [id number] Date of Birth: Jul 28, 1933  Today's Date: 11/13/2023 SLP Individual Time: 1100-1159 SLP Individual Time Calculation (min): 59 min  Short Term Goals: Week 1: SLP Short Term Goal 1 (Week 1): Pt will solve mildly complex problems w/ supervisionA SLP Short Term Goal 2 (Week 1): Pt will complete mildly specific word finding tasks w/ supervisionA SLP Short Term Goal 3 (Week 1): Pt will tolerate the lest restrictive diet w/ supervisionA SLP Short Term Goal 4 (Week 1): Pt willl recall recent/relevant info w/ supervisionA  Skilled Therapeutic Interventions: Skilled therapy session focused on education and cognitive goals. SLP facilitated session by discussing diet with family. Per son and patient, patient consumed all puree textures PTA and requested downgrade of diet. SLP downgraded diet to D1/thin liquids to aid in ease of consumption. SLP targeted cognitive goals by prompting patient to share biographical information re: self, job and family. Patient did so with supervisionA. SLP then targeted problem solving goals through medication management task. Patient completed BID pill box utilizing written directions. Patient completed pill box with supervisionA and made x1 mistake corrected with min verbal A. SLP continued to challenge patient by encouring her to locate mistakes and correct. Patient required modA fading to minA to complete. Patient left in  [facility] with alarm set and call bell in reach. Continue POC  Pain None reported   Therapy/Group: Individual Therapy  Ajahni Nay M.A., CCC-SLP 11/13/2023, 7:41 AM

## 2023-11-13 NOTE — Progress Notes (Signed)
 Occupational Therapy Session Note  Patient Details  Name: Courtney Cline MRN: [id number] Date of Birth: 09-Jun-1933  Today's Date: 11/13/2023 OT Individual Time: 9294-9189 OT Individual Time Calculation (min): 65 min    Short Term Goals: Week 1:  OT Short Term Goal 1 (Week 1): Pt will perform toilet transfers with Min A + LRAD. OT Short Term Goal 2 (Week 1): Pt will tolerate >2 mins of standing with Min A + LRAD, in preparation for standing ADLs. OT Short Term Goal 3 (Week 1): Pt will thread LB garments with Min A.  Skilled Therapeutic Interventions/Progress Updates:   Pt greeted resting in bed, no reports of pain at start of session. Received/maintained on 4L O2 [employer]. Pt comes to EOB with supervision. Pt defers bathing, dressing with Min A for threading of BLE into pants, standing balance with CGA-Min A during hiking. Setup A for UB dressing. Time dedicated to encouraging oral intake, patient eats 1/2 of magic cup. Pt dependent for WC transport from room<>day room. In day room, patient instructed in table top activity targeting standing balance/tolerance with unilateral support for carryover into LB ADLs. Pt tolerates ~2 mins of standing activity with close supervision, then reporting presence of pressure headache, vitals assessed with low diastolic BP noted, patient requires cuing to breathe through nose for more efficient consumption of O2. Pt then ambulates ~20 ft with CGA + RW, WC follow. Pt remained sitting in WC, posey belt activated, LPN made aware of symptomology.   Therapy Documentation Precautions:  Precautions Precautions: Fall Recall of Precautions/Restrictions: Impaired Precaution/Restrictions Comments: Watch HR and BP; 4L O2 [place] Restrictions Weight Bearing Restrictions Per Provider Order: No   Therapy/Group: Individual Therapy  [name], OTR/L, MSOT  11/13/2023, 6:45 AM

## 2023-11-13 NOTE — Progress Notes (Addendum)
 PROGRESS NOTE   Subjective/Complaints:  No events overnight.  No complaints per patient. Vitals stable, heart rate remains in the 60s.  Satting 100% on nasal cannula.    11/13/2023    8:27 AM 11/13/2023    8:13 AM 11/13/2023    4:29 AM  Vitals with BMI  Weight   141 lbs 9 oz  BMI   25.88  Systolic 125 125   Diastolic 59 59   Pulse 60 60       P.o. intakes 25-50% meals Continent of bladder  Last BM 9/28, small  A.m. labs significant for AKI and hemoglobin 9.1->7.8.   ROS: Denies fevers, chills, N/V, abdominal pain, constipation, diarrhea, SOB, cough, chest pain, new weakness or paraesthesias.   Poor appetite  Objective:   No results found. Recent Labs    11/13/23 0552  WBC 6.1  HGB 7.8*  HCT 26.3*  PLT 225    Recent Labs    11/13/23 0552  NA 142  K 4.3  CL 107  CO2 26  GLUCOSE 76  BUN 25*  CREATININE 1.46*  CALCIUM  8.6*     Intake/Output Summary (Last 24 hours) at 11/13/2023 0901 Last data filed at 11/12/2023 1900 Gross per 24 hour  Intake 109 ml  Output --  Net 109 ml        Physical Exam: Vital Signs Blood pressure (!) 125/59, pulse 60, temperature (!) 97.5 F (36.4 C), temperature source Oral, resp. rate 16, height 5' 2 (1.575 m), weight 64.2 kg, SpO2 100%.  Constitutional: No apparent distress. Appropriate appearance for age.  Sitting up in gym. HENT: No JVD. Neck Supple. Trachea midline. Atraumatic, normocephalic. Eyes: PERRLA. EOMI. Visual fields grossly intact.  Cardiovascular: RRR, no murmurs/rub/gallops.  Trace bilateral lower extremity edema, right greater than left.  Peripheral pulses 2+  Respiratory: Global mostly diminished, clear to auscultation bilaterally,, no rales, rhonchi, or wheezing.  On 3 L nasal cannula--stable Abdomen: + bowel sounds, normoactive. No distention or tenderness. Soft.  Skin: skin tear RLE covered with tegaderm. Otherwise, skin C/D/I. No apparent  lesions otherwise.  Multiple bruises on bilateral upper extremities.    MSK: No apparent deformity.     Neurologic exam:  Cognition: AAO to person, place, time  Language: Fluent, No substitutions or neoglisms. No dysarthria.  Memory: Mild deficits apparent; unchanged Insight: Good  insight into current condition.  Mood: Pleasant affect, appropriate mood.  Sensation: Equal and intact in BL UE and Les.  Reflexes: 2+ in BL UE and LEs. Negative Hoffman's and babinski signs bilaterally.  CN: 2-12 grossly intact.  Coordination: No apparent tremors. No ataxia on FTN, HTS bilaterally.  Spasticity: MAS 0 in all extremities.    Assessment/Plan: 1. Functional deficits which require 3+ hours per day of interdisciplinary therapy in a comprehensive inpatient rehab setting. Physiatrist is providing close team supervision and 24 hour management of active medical problems listed below. Physiatrist and rehab team continue to assess barriers to discharge/monitor patient progress toward functional and medical goals  Care Tool:  Bathing  Bathing activity did not occur: Safety/medical concerns (Limited eval due to elevated/variable HR (67-135 bpm).)  Bathing assist       Upper Body Dressing/Undressing Upper body dressing Upper body dressing/undressing activity did not occur (including orthotics): Safety/medical concerns (Limited eval due to elevated/variable HR (67-135 bpm).)      Upper body assist      Lower Body Dressing/Undressing Lower body dressing    Lower body dressing activity did not occur: Safety/medical concerns (Limited eval due to elevated/variable HR (67-135 bpm).)       Lower body assist       Toileting Toileting    Toileting assist Assist for toileting: 2 Helpers     Transfers Chair/bed transfer  Transfers assist  Chair/bed transfer activity did not occur: Safety/medical concerns        Locomotion Ambulation   Ambulation assist   Ambulation  activity did not occur: Safety/medical concerns          Walk 10 feet activity   Assist  Walk 10 feet activity did not occur: Safety/medical concerns        Walk 50 feet activity   Assist Walk 50 feet with 2 turns activity did not occur: Safety/medical concerns         Walk 150 feet activity   Assist Walk 150 feet activity did not occur: Safety/medical concerns         Walk 10 feet on uneven surface  activity   Assist Walk 10 feet on uneven surfaces activity did not occur: Safety/medical concerns         Wheelchair     Assist Is the patient using a wheelchair?: Yes Type of Wheelchair: Manual Wheelchair activity did not occur: Safety/medical concerns         Wheelchair 50 feet with 2 turns activity    Assist    Wheelchair 50 feet with 2 turns activity did not occur: Safety/medical concerns       Wheelchair 150 feet activity     Assist  Wheelchair 150 feet activity did not occur: Safety/medical concerns       Blood pressure (!) 125/59, pulse 60, temperature (!) 97.5 F (36.4 C), temperature source Oral, resp. rate 16, height 5' 2 (1.575 m), weight 64.2 kg, SpO2 100%.  Medical Problem List and Plan: 1. Functional deficits secondary to left thalamic capsular infarct             -patient may shower             -ELOS/Goals: 10-14 days S             -Stable to continue CIR  - Making excellent progress, teams in a.m.   2.  Antithrombotics: -DVT/anticoagulation:  Mechanical: Sequential compression devices, below knee Bilateral lower extremities Pharmaceutical: Eliquis              -antiplatelet therapy: Eliquis  2.5 mg twice daily 3. Pain Management: Tylenol  as needed 4. Mood/Behavior/Sleep: LCSW to follow for evaluation and support when available.              -antipsychotic agents: [name]             - Continue melatonin 3 mg as needed at bedtime, also has trazodone  PRN 5. Neuropsych/cognition: This patient is capable of making  decisions on her own behalf. 6. Skin/Wound Care: Routine pressure relief measures 7. Fluids/Electrolytes/Nutrition: Monitor I&O Daily weights with follow-up labs. Continue ensure. Continue vitamins/supplements.              - SLP following; on DYS 3-- now DYS 2 - 9/26: Poor p.o. intakes for  several months per family due to lack of appetite since finishing an antibiotic.  Nutrition consulted for recommendations, will start mirtazapine  7.5 mg nightly for appetite. - 9/29: P.o. intakes remain poor.  Start Megace 200 mg twice daily.     8.  A-fib: History of noncompliance with Eliquis .              - Resumed Eliquis              - continue Metoprolol  25 mg nightly for rate control, currently stable - 9/25: A-fib with RVR into the 150s with mobilization this a.m.  Got additional dose of metoprolol  this a.m. for coverage.  Cardiology consulted, appreciate recommendations 9-26: Cardiology switched from long-acting metoprolol  to Lopressor  25 mg twice daily; patient is tolerating this much better.  DC IV fluids. -11/11/23 rate controlled, doing well on this regimen   9.  HTN/HFpEF: continue hydralazine  25 mg twice daily and metoprolol              - Monitor blood pressures with increased activity - 9-25: Hypotensive into the 80s with mobilization; hold hydralazine , continue metoprolol  for rate control, will give 1 L IV fluid today.  Cardiology consult pending. - 9/26: Blood pressure much better today, 140s over 50s.  Daily weight stable. DC IV fluids, monitor.    -9/27-28/25 BPs improving, wt stable, monitor.   Vitals:   11/11/23 0349 11/11/23 0800 11/11/23 0900 11/11/23 1443  BP: (!) 103/58 (!) 154/60 (!) 133/49 120/71   11/11/23 2001 11/11/23 2027 11/12/23 0301 11/12/23 1519  BP: (!) 97/59 (!) 156/52 (!) 156/77 (!) 122/57   11/12/23 1907 11/13/23 0405 11/13/23 0813 11/13/23 0827  BP: (!) 120/50 (!) 102/53 (!) 125/59 (!) 125/59    Filed Weights   11/11/23 0517 11/12/23 0315 11/13/23 0429  Weight:  62.8 kg 64.3 kg 64.2 kg    10.  HLD: Crestor  transition to Lipitor  80 mg   11.  AKI: Baseline creatinine 1.2-1.5. Required IV fluids.  Currently 1.51    - 9-25: Slow improvement, creatinine 1.4.  IV fluids today as above.  - 9/29: P.o. intakes remain poor, BUN/creatinine up slightly, advised patient to increase p.o. intakes.  Repeat in AM.  12.  COPD:  continue Breztri , DuoNeb as needed             - Incentive spirometer   13. Constipation: last BM 9/22, add senna HS  - 9/25: add daily miralax    - 9/26: Give sorbitol  today -11/11/23 multiple BMs with sorbitol  (would be cautious in the future), cont regimen but monitor if still having BMs too often after clean out. -11/12/23 LBM overnight, monitor.    14.  Anemia.  Add iron  studies, FOBT today.  Add Protonix  40 mg twice daily AC for GI protection.  Repeat H&H in AM.  LOS: 5 days A FACE TO FACE EVALUATION WAS PERFORMED  Courtney Cline Likes 11/13/2023, 9:01 AM

## 2023-11-13 NOTE — Progress Notes (Signed)
 Speech Language Pathology Daily Session Note  Patient Details  Name: Courtney Cline MRN: [id number] Date of Birth: [birth year]  Today's Date: 11/13/2023 SLP Individual Time: 1400-1445 SLP Individual Time Calculation (min): 45 min  Short Term Goals: Week 1: SLP Short Term Goal 1 (Week 1): Pt will solve mildly complex problems w/ supervisionA SLP Short Term Goal 2 (Week 1): Pt will complete mildly specific word finding tasks w/ supervisionA SLP Short Term Goal 3 (Week 1): Pt will tolerate the lest restrictive diet w/ supervisionA SLP Short Term Goal 4 (Week 1): Pt willl recall recent/relevant info w/ supervisionA  Skilled Therapeutic Interventions:   Pt and her son greeted at bedside for tx tasks targeting cognition. Her son reported increased PO intake at lunch given downgrade to Dys 1. She reported that she did not like it as much as Dys 2 but also reported consuming pureed textures at home. Will follow up with her and son re PO intake in upcoming tx sessions. SLP then facilitated written tx task targeting organization and problem solving. She completed this independently, sustaining attention for ~13 mins throughout task. She also completed a mildly specific naming task, requiring supervisionA for category ID and working memory. At the end of tx tasks, she was left in her chair w/ the alarm set and call light within reach. Recommend cont ST per POC.  Pain  None reported  Therapy/Group: Individual Therapy  [name] 11/13/2023, 8:43 PM

## 2023-11-13 NOTE — Progress Notes (Signed)
 Physical Therapy Session Note  Patient Details  Name: Courtney Cline MRN: [id number] Date of Birth: [birth year]  Today's Date: 11/13/2023 PT Individual Time: 0920-0949 PT Individual Time Calculation (min): 29 min   Short Term Goals: Week 1:  PT Short Term Goal 1 (Week 1): Pt will transfer sup to sit w/ min A consistently. PT Short Term Goal 2 (Week 1): Pt will transfer st to stand w/ min A consistently. PT Short Term Goal 3 (Week 1): PT to assess gait PT Short Term Goal 4 (Week 1): Pt to assess stairs.  Skilled Therapeutic Interventions/Progress Updates:    Pt presents in room in [facility], pt son present. Pt reporting pain in R shin and R knee with pt reporting feeling this since fall over the weekend. Pt expressing increased fear of falling since this weekend, states that she believes oxygen  tubing got caught and tripped her causing fall. Pt educated on focusing on improving strength and continuing gait training to improve stability and decrease fear of falling, as well as gait training and transfer training with emphasis on O2 line management due to pt using O2 at home with pt verbalizing understanding. Pt transported from room to day room. Pt completes gait 79' and 74' with RW CGA, improved RLE foot clearance noted, +2 WC follow for fatigue, completed to promote improved upright tolerance. Pt provided with seated rest breaks between gait trials to promote energy conservation and quality with tasks. Pt returns to room and remains seated in [facility] with all needs within reach, cal light in place and chair alarm donned and activated at end of session.   Therapy Documentation Precautions:  Precautions Precautions: Fall Recall of Precautions/Restrictions: Impaired Precaution/Restrictions Comments: Watch HR and BP; 4L O2 [place] Restrictions Weight Bearing Restrictions Per Provider Order: No   Therapy/Group: Individual Therapy  Reche Ohara PT, DPT 11/13/2023, 1:03 PM

## 2023-11-14 ENCOUNTER — Telehealth (HOSPITAL_BASED_OUTPATIENT_CLINIC_OR_DEPARTMENT_OTHER): Payer: Self-pay

## 2023-11-14 DIAGNOSIS — J9611 Chronic respiratory failure with hypoxia: Secondary | ICD-10-CM

## 2023-11-14 DIAGNOSIS — J449 Chronic obstructive pulmonary disease, unspecified: Secondary | ICD-10-CM

## 2023-11-14 LAB — BASIC METABOLIC PANEL WITH GFR
Anion gap: 6 (ref 5–15)
BUN: 21 mg/dL (ref 8–23)
CO2: 28 mmol/L (ref 22–32)
Calcium: 8.6 mg/dL — ABNORMAL LOW (ref 8.9–10.3)
Chloride: 108 mmol/L (ref 98–111)
Creatinine, Ser: 1.29 mg/dL — ABNORMAL HIGH (ref 0.44–1.00)
GFR, Estimated: 40 mL/min — ABNORMAL LOW (ref >60)
Glucose, Bld: 82 mg/dL (ref 70–99)
Potassium: 4 mmol/L (ref 3.5–5.1)
Sodium: 142 mmol/L (ref 135–145)

## 2023-11-14 LAB — OCCULT BLOOD X 1 CARD TO LAB, STOOL: Fecal Occult Bld: NEGATIVE

## 2023-11-14 LAB — HEMOGLOBIN AND HEMATOCRIT, BLOOD
HCT: 26.7 % — ABNORMAL LOW (ref 36.0–46.0)
Hemoglobin: 8.1 g/dL — ABNORMAL LOW (ref 12.0–15.0)

## 2023-11-14 MED ORDER — HYDROCERIN EX CREA
TOPICAL_CREAM | Freq: Every day | CUTANEOUS | Status: DC
  Filled 2023-11-14: qty 113

## 2023-11-14 MED ORDER — VITAMIN B-12 100 MCG PO TABS
50.0000 ug | ORAL_TABLET | Freq: Every day | ORAL | Status: DC
  Administered 2023-11-14 – 2023-11-23 (×10): 50 ug via ORAL
  Filled 2023-11-14 (×10): qty 1

## 2023-11-14 MED ORDER — FOLIC ACID 1 MG PO TABS
1.0000 mg | ORAL_TABLET | Freq: Every day | ORAL | Status: DC
  Administered 2023-11-14 – 2023-11-23 (×10): 1 mg via ORAL
  Filled 2023-11-14 (×10): qty 1

## 2023-11-14 MED ORDER — FERROUS FUMARATE 324 (106 FE) MG PO TABS
1.0000 | ORAL_TABLET | Freq: Every day | ORAL | Status: DC
  Administered 2023-11-14 – 2023-11-23 (×10): 106 mg via ORAL
  Filled 2023-11-14 (×10): qty 1

## 2023-11-14 MED ORDER — VITAMIN C 500 MG PO TABS
250.0000 mg | ORAL_TABLET | Freq: Every day | ORAL | Status: DC
  Administered 2023-11-14 – 2023-11-23 (×10): 250 mg via ORAL
  Filled 2023-11-14 (×10): qty 1

## 2023-11-14 MED ORDER — GABAPENTIN 100 MG PO CAPS
100.0000 mg | ORAL_CAPSULE | Freq: Three times a day (TID) | ORAL | Status: DC
  Administered 2023-11-14 – 2023-11-16 (×7): 100 mg via ORAL
  Filled 2023-11-14 (×7): qty 1

## 2023-11-14 NOTE — Plan of Care (Signed)
  Problem: RH BLADDER ELIMINATION Goal: RH STG MANAGE BLADDER WITH ASSISTANCE Description: STG Manage Bladder With min- supervision Assistance Outcome: Progressing   Problem: RH SKIN INTEGRITY Goal: RH STG SKIN FREE OF INFECTION/BREAKDOWN Description: Manage skin free of infection/breakdown with min- supervision assistance  Outcome: Progressing   Problem: RH SAFETY Goal: RH STG ADHERE TO SAFETY PRECAUTIONS W/ASSISTANCE/DEVICE Description: STG Adhere to Safety Precautions With min- supervision Assistance/Device. Outcome: Progressing   Problem: RH PAIN MANAGEMENT Goal: RH STG PAIN MANAGED AT OR BELOW PT'S PAIN GOAL Description: <4 w/ prns Outcome: Progressing

## 2023-11-14 NOTE — Patient Care Conference (Signed)
 Inpatient RehabilitationTeam Conference and Plan of Care Update Date: 11/14/2023   Time: 1050 am    Patient Name: Courtney Cline      Medical Record Number: [id number]  Date of Birth: 23-Apr-1933 Sex: Female         Room/Bed: 4W05C/4W05C-01 Payor Info: Payor: [employer] MEDICARE / Plan: [employer] MEDICARE HMO / Product Type: *No Product type* /    Admit Date/Time:  11/08/2023  4:37 PM  Primary Diagnosis:  CVA (cerebral vascular accident) [facility])  Hospital Problems: Principal Problem:   CVA (cerebral vascular accident) [facility]) Active Problems:   Chronic heart failure with preserved ejection fraction [facility])    Expected Discharge Date: Expected Discharge Date: 11/23/23  Team Members Present: Physician leading conference: Dr. Joesph Likes Social Worker Present: Graeme Jude, LCSW Nurse Present: Eulalio Falls, RN PT Present: Elam Ohara, PT OT Present: [name], OT SLP Present: Recardo Mole, SLP PPS Coordinator present : Eleanor Colon, SLP     Current Status/Progress Goal Weekly Team Focus  Bowel/Bladder       Continent of bowel and bladder   Maintain continence of bowel and bladder      Assess bowel and bladder q shift   Swallow/Nutrition/ Hydration   D1/thin   mod i  education, use of compensatory strategies, upgraded textures    ADL's   Setup A for UB care; Mod-Max A for LB care. Barriers: Poor activity tolerance, decreased standing balance, and general debility.   Min A self care goals, CGA for transfers   General conditioning, activity tolerance, standing balance/tolerance    Mobility   supervision minA, transfers with CGA/min assist, gait up to 44' with RW min assist   supervision ambulatory  barriers: endurance; focus on gait training, global strengthening, and dynamic standing balance    Communication   mild expressive deficits   mod i   word finding strategies and use during conversation    Safety/Cognition/ Behavioral Observations  minA    supervision-mod i   memory of new information, problem solving of mildly complex information    Pain      Generalized pain    <4 w/ prns   Assess pain q shift   Skin      Skin tear right lower leg    Skin free from skin issues entire in rehab   Assess skin q shift     Discharge Planning:  Pt will discharge to home with her husbnad, and support from son and DIL who will be moving into the home to assist if needed. SW will confirm there are no barriers to discharge.    Team Discussion: Patient was admitted post left thalamic capsular infarct. Patient with poor po intake/low hemoglobin /pain: medication adjusted by MD. Progress limited by fatigue, poor endurance, poor activity tolerance, poor balance and easily winded with 02 at 100% at 4L.SABRA  Patient on target to meet rehab goals: Curently patient needs set up assist with upper body care and mod-max assist with lower body care. Patient needs minimal assistance with transfers and was able to ambulate up to 5' wheelchair follow  with minimal assistance. Patient with mild expressive deficits. Overall goals at discharge are set for supervision assistance.   *See Care Plan and progress notes for long and short-term goals.   Revisions to Treatment Plan:  Downgraded to dysphagia 1 diet r   Cardiology consult Dietary consult  Teaching Needs: Safety, Increase po intake, medications, transfers toileting, etc   Current Barriers to Discharge: Decreased caregiver support,  Home enviroment access/layout, Weight, and chronic oxygen  use  Possible Resolutions to Barriers: Family Education     Medical Summary Current Status: medically complicated by respiratory failure, calf pain, severe protein calorie malnutrition, anemia, AKI, and atrial fibrillation  Barriers to Discharge: Electrolyte abnormality;Inadequate Nutritional Intake;Incontinence;Medical stability;Renal Insufficiency/Failure;Self-care education;Uncontrolled Pain   Possible  Resolutions to [employer]: titrate medications for calf pain, appetite stimulants and dietary involvements for poor POs, monitor labs for AKI / dehydration, monitor vitals for heart rhythm   Continued Need for Acute Rehabilitation Level of Care: The patient requires daily medical management by a physician with specialized training in physical medicine and rehabilitation for the following reasons: Direction of a multidisciplinary physical rehabilitation program to maximize functional independence : Yes Medical management of patient stability for increased activity during participation in an intensive rehabilitation regime.: Yes Analysis of laboratory values and/or radiology reports with any subsequent need for medication adjustment and/or medical intervention. : Yes   I attest that I was present, lead the team conference, and concur with the assessment and plan of the team.   Acheron Sugg Gayo 11/14/2023, 1050 am

## 2023-11-14 NOTE — Progress Notes (Signed)
 Physical Therapy Session Note  Patient Details  Name: Courtney Cline MRN: [id number] Date of Birth: May 22, 1933  Today's Date: 11/14/2023 PT Individual Time: 9164-9054 PT Individual Time Calculation (min): 70 min   Short Term Goals: Week 1:  PT Short Term Goal 1 (Week 1): Pt will transfer sup to sit w/ min A consistently. PT Short Term Goal 2 (Week 1): Pt will transfer st to stand w/ min A consistently. PT Short Term Goal 3 (Week 1): PT to assess gait PT Short Term Goal 4 (Week 1): Pt to assess stairs.  Skilled Therapeutic Interventions/Progress Updates:    Pt presents in room in bed, agreeable to PT. Pt reporting pain in RLE at laceration site. Session focused on therapeutic activities to facilitate improved independence and safety with transfers and self care tasks as well as gait training for RW management and multidirectional stepping stability. Vitals assessed and WNL throughout session. Pt completes bed mobility with elevated HOB with supervision. Pt completes transfers with CGA/min assist, cues for hand placement to push from sitting surface. Pt ambulates with RW to bathroom ~20' with min/mod assist for managing RW over threshold in bathroom and with turning to sit on toilet. Pt continent of void, documented, and pt able to manage lower body dressing in standing with min assist/ max assist for pulling up over hips in standing and min assist for standing balance. Pt able to maintain standing ~2 minutes for management of brief and LB dressing, pt reporting fatigue however pt ambulates with min progressing to mod assist for postural stability with pt demonstrating increasing bilateral knee flexion with fatigue. Pt comes to sitting in WC with mod assist. Pt transported to day room dependently via [facility] for time management and energy conservation. Pt completes sit to stand from [facility] with CGA/min assist, with cues initially for hand placement to stand but consistently places hands appropriately  throughout rest of session. Pt completes gait training for RW management and multidirectional stepping stability with min assist and mod verbal cues for technique including: - forward/backward gait 3x8' with RW - gait around cone and back to sitting 3x16' with RW Pt then ambulates with RW with min assist and +2 WC follow 82', cues for RLE foot clearance and upright posture as pt demonstrating bilateral flexed knees and decreased RLE foot cleraance with fatigue. Pt returns to room and remains seated in [facility] with all needs within reach, cal light in place and chair alarm donned and activated at end of session.   Therapy Documentation Precautions:  Precautions Precautions: Fall Recall of Precautions/Restrictions: Impaired Precaution/Restrictions Comments: Watch HR and BP; 4L O2 [place] Restrictions Weight Bearing Restrictions Per Provider Order: No   Therapy/Group: Individual Therapy  Reche Ohara PT, DPT 11/14/2023, 10:50 AM

## 2023-11-14 NOTE — Telephone Encounter (Signed)
 OK to send order to [employer] for humidier for oxygen 

## 2023-11-14 NOTE — Progress Notes (Signed)
 Speech Language Pathology Daily Session Note  Patient Details  Name: Courtney Cline MRN: [id number] Date of Birth: Oct 08, 1933  Today's Date: 11/14/2023 SLP Individual Time: 1100-1200 SLP Individual Time Calculation (min): 60 min  Short Term Goals: Week 1: SLP Short Term Goal 1 (Week 1): Pt will solve mildly complex problems w/ supervisionA SLP Short Term Goal 2 (Week 1): Pt will complete mildly specific word finding tasks w/ supervisionA SLP Short Term Goal 3 (Week 1): Pt will tolerate the lest restrictive diet w/ supervisionA SLP Short Term Goal 4 (Week 1): Pt willl recall recent/relevant info w/ supervisionA  Skilled Therapeutic Interventions:   Pt and his son greeted at bedside. She was very pleasant and cooperative throughout tx tasks targeting cognition. She and her son endorsed slightly improved PO intake w/ Dys 1 textures and was agreeable to remain on Dys 1 textures. SLP facilitated functional money management task calculating money totals. She benefited from supervisionA for working memory, Medical sales representative, and information processing. She then completed a complex naming task (category/letter) w/ modA. Additional cueing required d/t specificity of task. Improved success noted w/ naming task of only mild complexity, as she required only supervisionA to ID words w/ missing letters (category provided). At the end of tx tasks, she was left in her chair w/ the alarm set and call light within reach. Recommend cont ST per POC.    Pain  No pain reported  Therapy/Group: Individual Therapy  [name] 11/14/2023, 12:56 PM

## 2023-11-14 NOTE — Progress Notes (Signed)
 PROGRESS NOTE   Subjective/Complaints:  No events overnight.  No complaints per patient.  Continues with poor appetite, states she ate a little bit of breakfast but did not feel hungry.  Denies any nausea, thinks she had a bowel movement recently.  Per SLP, may be secondary to being on an appropriate diet, as she blends all her food at home; did better with breakfast.  H&H is a.m. up to 8.1.  BUN/creatinine improved from yesterday.  RN states show anemia chronic disease.   ROS: Denies fevers, chills, N/V, abdominal pain, constipation, diarrhea, SOB, cough, chest pain, new weakness or paraesthesias.   Poor appetite  Objective:   No results found. Recent Labs    11/13/23 0552 11/14/23 0531  WBC 6.1  --   HGB 7.8* 8.1*  HCT 26.3* 26.7*  PLT 225  --     Recent Labs    11/13/23 0552 11/14/23 0531  NA 142 142  K 4.3 4.0  CL 107 108  CO2 26 28  GLUCOSE 76 82  BUN 25* 21  CREATININE 1.46* 1.29*  CALCIUM  8.6* 8.6*     Intake/Output Summary (Last 24 hours) at 11/14/2023 0910 Last data filed at 11/14/2023 0800 Gross per 24 hour  Intake 440 ml  Output --  Net 440 ml        Physical Exam: Vital Signs Blood pressure (!) 132/49, pulse 63, temperature 98.5 F (36.9 C), temperature source Oral, resp. rate 17, height 5' 2 (1.575 m), weight 64 kg, SpO2 99%.  Constitutional: No apparent distress. Appropriate appearance for age.  Sitting up in gym. HENT: No JVD. Neck Supple. Trachea midline. Atraumatic, normocephalic. Eyes: PERRLA. EOMI. Visual fields grossly intact.  Cardiovascular: RRR, no murmurs/rub/gallops.  Trace bilateral lower extremity edema.  Unchanged peripheral pulses 2+  Respiratory: Global mostly diminished, clear to auscultation bilaterally,, no rales, rhonchi, or wheezing.  On 2 L nasal cannula--stable Abdomen: + bowel sounds, normoactive. No distention or tenderness. Soft.  Skin: skin tear RLE covered  with tegaderm. Otherwise, skin C/D/I. No apparent lesions otherwise.  Multiple bruises on bilateral upper extremities.    MSK: No apparent deformity.     Neurologic exam:  Cognition: AAO to person, place, time  Language: Fluent, No substitutions or neoglisms. No dysarthria.  Memory: Mild deficits apparent; unchanged Insight: Good  insight into current condition.  Mood: Pleasant affect, appropriate mood.  Sensation: Equal and intact in BL UE and Les.  Reflexes: 2+ in BL UE and LEs. Negative Hoffman's and babinski signs bilaterally.  CN: 2-12 grossly intact.  Coordination: No apparent tremors. No ataxia on FTN, HTS bilaterally.  Spasticity: MAS 0 in all extremities.   Physical exam unchanged from the above on reexamination 11/14/23    Assessment/Plan: 1. Functional deficits which require 3+ hours per day of interdisciplinary therapy in a comprehensive inpatient rehab setting. Physiatrist is providing close team supervision and 24 hour management of active medical problems listed below. Physiatrist and rehab team continue to assess barriers to discharge/monitor patient progress toward functional and medical goals  Care Tool:  Bathing  Bathing activity did not occur: Safety/medical concerns (Limited eval due to elevated/variable HR (67-135 bpm).)  Bathing assist       Upper Body Dressing/Undressing Upper body dressing Upper body dressing/undressing activity did not occur (including orthotics): Safety/medical concerns (Limited eval due to elevated/variable HR (67-135 bpm).)      Upper body assist      Lower Body Dressing/Undressing Lower body dressing    Lower body dressing activity did not occur: Safety/medical concerns (Limited eval due to elevated/variable HR (67-135 bpm).)       Lower body assist       Toileting Toileting    Toileting assist Assist for toileting: 2 Helpers     Transfers Chair/bed transfer  Transfers assist  Chair/bed transfer  activity did not occur: Safety/medical concerns        Locomotion Ambulation   Ambulation assist   Ambulation activity did not occur: Safety/medical concerns          Walk 10 feet activity   Assist  Walk 10 feet activity did not occur: Safety/medical concerns        Walk 50 feet activity   Assist Walk 50 feet with 2 turns activity did not occur: Safety/medical concerns         Walk 150 feet activity   Assist Walk 150 feet activity did not occur: Safety/medical concerns         Walk 10 feet on uneven surface  activity   Assist Walk 10 feet on uneven surfaces activity did not occur: Safety/medical concerns         Wheelchair     Assist Is the patient using a wheelchair?: Yes Type of Wheelchair: Manual Wheelchair activity did not occur: Safety/medical concerns         Wheelchair 50 feet with 2 turns activity    Assist    Wheelchair 50 feet with 2 turns activity did not occur: Safety/medical concerns       Wheelchair 150 feet activity     Assist  Wheelchair 150 feet activity did not occur: Safety/medical concerns       Blood pressure (!) 132/49, pulse 63, temperature 98.5 F (36.9 C), temperature source Oral, resp. rate 17, height 5' 2 (1.575 m), weight 64 kg, SpO2 99%.  Medical Problem List and Plan: 1. Functional deficits secondary to left thalamic capsular infarct             -patient may shower             -ELOS/Goals: 10-14 days S - 10/9             -Stable to continue CIR   - 9/30: Lives with husband who cannot provide assist; supervision goals currently, setup for UBD and Mod-Max LBD. Activity tolerance remains poor as well as balance. Min A goals and CGA for transfers goals. Mod I goals SLP. Per son not far off her bsaeline for cog. Dys 1 diet downgrade  -  apparently blended all her foods at home.    2.  Antithrombotics: -DVT/anticoagulation:  Mechanical: Sequential compression devices, below knee Bilateral  lower extremities Pharmaceutical: Eliquis              -antiplatelet therapy: Eliquis  2.5 mg twice daily  3. Pain Management: Tylenol  as needed   - 9/30: Start gabapentin 100 mg TID for LE neuropathy, pain  4. Mood/Behavior/Sleep: LCSW to follow for evaluation and support when available.              -antipsychotic agents: [name]             - Continue  melatonin 3 mg as needed at bedtime, also has trazodone  PRN 5. Neuropsych/cognition: This patient is capable of making decisions on her own behalf. 6. Skin/Wound Care: Routine pressure relief measures   - 9/30: R anterior skin tear from fall ; eucerin daily for dry skin  7. Fluids/Electrolytes/Nutrition: Monitor I&O Daily weights with follow-up labs. Continue ensure. Continue vitamins/supplements.              - SLP following; on DYS 3-- now DYS 2 - 9/26: Poor p.o. intakes for several months per family due to lack of appetite since finishing an antibiotic.  Nutrition consulted for recommendations, will start mirtazapine  7.5 mg nightly for appetite. - 9/29: P.o. intakes remain poor.  Start Megace 200 mg twice daily. 9-30: Downgraded to dysphagia 1 diet due to blending foods at home.  Monitor intakes on Megace.     8.  A-fib: History of noncompliance with Eliquis .              - Resumed Eliquis              - continue Metoprolol  25 mg nightly for rate control, currently stable - 9/25: A-fib with RVR into the 150s with mobilization this a.m.  Got additional dose of metoprolol  this a.m. for coverage.  Cardiology consulted, appreciate recommendations 9-26: Cardiology switched from long-acting metoprolol  to Lopressor  25 mg twice daily; patient is tolerating this much better.  DC IV fluids. -11/11/23 rate controlled, doing well on this regimen   9.  HTN/HFpEF: continue hydralazine  25 mg twice daily and metoprolol              - Monitor blood pressures with increased activity - 9-25: Hypotensive into the 80s with mobilization; hold hydralazine , continue  metoprolol  for rate control, will give 1 L IV fluid today.  Cardiology consult pending. - 9/26: Blood pressure much better today, 140s over 50s.  Daily weight stable. DC IV fluids, monitor.    -9/27-28/25 BPs improving, wt stable, monitor.   Vitals:   11/11/23 1443 11/11/23 2001 11/11/23 2027 11/12/23 0301  BP: 120/71 (!) 97/59 (!) 156/52 (!) 156/77   11/12/23 1519 11/12/23 1907 11/13/23 0405 11/13/23 0813  BP: (!) 122/57 (!) 120/50 (!) 102/53 (!) 125/59   11/13/23 0827 11/13/23 1708 11/13/23 2006 11/14/23 0510  BP: (!) 125/59 (!) 133/42 (!) 132/38 (!) 132/49    Filed Weights   11/12/23 0315 11/13/23 0429 11/14/23 0500  Weight: 64.3 kg 64.2 kg 64 kg    10.  HLD: Crestor  transition to Lipitor  80 mg   11.  AKI: Baseline creatinine 1.2-1.5. Required IV fluids.  Currently 1.51    - 9-25: Slow improvement, creatinine 1.4.  IV fluids today as above.  - 9/29: P.o. intakes remain poor, BUN/creatinine up slightly, advised patient to increase p.o. intakes.  Repeat in AM.  9-30: BUN/creatinine improved.  12.  COPD:  continue Breztri , DuoNeb as needed             - Incentive spirometer   13. Constipation: last BM 9/22, add senna HS  - 9/25: add daily miralax    - 9/26: Give sorbitol  today -11/11/23 multiple BMs with sorbitol  (would be cautious in the future), cont regimen but monitor if still having BMs too often after clean out. -11/12/23 LBM overnight, monitor.    14.  Anemia.  Add iron  studies, FOBT today.  Add Protonix  40 mg twice daily AC for GI protection.  Repeat H&H in AM.  - Overdue for EPO, which she gets as  outpatient, administered 9-29  - 9-30: Hemoglobin stable, 8.1.  FOBT pending.  Iron  studies significant for anemia chronic disease   LOS: 6 days A FACE TO FACE EVALUATION WAS PERFORMED  Joesph JAYSON Likes 11/14/2023, 9:10 AM

## 2023-11-14 NOTE — Plan of Care (Signed)
  Problem: Consults Goal: RH STROKE PATIENT EDUCATION Description: See Patient Education module for education specifics  Outcome: Progressing   Problem: RH BOWEL ELIMINATION Goal: RH STG MANAGE BOWEL WITH ASSISTANCE Description: STG Manage Bowel with supervision-minimal Assistance. Outcome: Progressing   Problem: RH BLADDER ELIMINATION Goal: RH STG MANAGE BLADDER WITH ASSISTANCE Description: STG Manage Bladder With min- supervision Assistance Outcome: Progressing   Problem: RH SKIN INTEGRITY Goal: RH STG SKIN FREE OF INFECTION/BREAKDOWN Description: Manage skin free of infection/breakdown with min- supervision assistance  Outcome: Progressing   Problem: RH SAFETY Goal: RH STG ADHERE TO SAFETY PRECAUTIONS W/ASSISTANCE/DEVICE Description: STG Adhere to Safety Precautions With min- supervision Assistance/Device. Outcome: Progressing

## 2023-11-14 NOTE — Progress Notes (Signed)
 Occupational Therapy Session Note  Patient Details  Name: Courtney Cline MRN: [id number] Date of Birth: 10-20-1933  Today's Date: 11/14/2023 OT Individual Time: 8694-8584 OT Individual Time Calculation (min): 70 min    Short Term Goals: Week 1:  OT Short Term Goal 1 (Week 1): Pt will perform toilet transfers with Min A + LRAD. OT Short Term Goal 2 (Week 1): Pt will tolerate >2 mins of standing with Min A + LRAD, in preparation for standing ADLs. OT Short Term Goal 3 (Week 1): Pt will thread LB garments with Min A.  Skilled Therapeutic Interventions/Progress Updates:   Pt greeted sitting in recliner, complaints of upper back/neck pain with medications administered during session and OT providing moist heat to area. Pt received/maintained on 4L O2 [place] with VSS throughout session, extended rest provided. Pt dependent for WC transport from room<>day room. In day room, pt instructed in series of standing tolerance/balance activaties targeting unilateral support for carryover into standing ADLs. Pt tolerates bouts of ~1-2 mins of standing with CGA + RW, Min A provided intermediately with fatigue of RLE. Pt completes session with x2 bouts of functional mobility, room-level distance, CGA + RW provided alongside cuing for RLE step height/length. Time dedicated at end of session to educate patient and son on OT POC and goals for CGA upon discharge. Pt remained sitting in WC with all immediate needs met.  Therapy Documentation Precautions:  Precautions Precautions: Fall Recall of Precautions/Restrictions: Impaired Precaution/Restrictions Comments: Watch HR and BP; 4L O2 [place] Restrictions Weight Bearing Restrictions Per Provider Order: No   Therapy/Group: Individual Therapy  [name], OTR/L, MSOT  11/14/2023, 7:56 AM

## 2023-11-14 NOTE — Progress Notes (Signed)
 Patient ID: Courtney Cline, female   DOB: [birth year], 89 y.o.   MRN: [id number]  SW met with pt and pt son [name] in room to provide updates from team conference, and d/c date 10/9. SW discussed family edu. States he and his wife will be avialable to help but will need to speak with his brother about the amountof support he can provide as well. Will confirm fam edu.   Graeme Jude, MSW, LCSW Office: [phone] Cell: [phone] Fax: [phone]

## 2023-11-14 NOTE — Telephone Encounter (Unsigned)
 Copied from CRM [id number]. Topic: Clinical - Order For Equipment >> Nov 14, 2023  1:03 PM Courtney Cline wrote: Reason for CRM: Patients son Courtney Cline) is requesting Dr. Kassie to send a fax order to [facility] for a humidifier for the patients oxygen  concentrator.

## 2023-11-15 ENCOUNTER — Inpatient Hospital Stay

## 2023-11-15 MED ORDER — ENSURE PLUS HIGH PROTEIN PO LIQD
237.0000 mL | Freq: Three times a day (TID) | ORAL | Status: DC
  Administered 2023-11-15 – 2023-11-22 (×15): 237 mL via ORAL

## 2023-11-15 MED ORDER — ACETAMINOPHEN 500 MG PO TABS
1000.0000 mg | ORAL_TABLET | Freq: Three times a day (TID) | ORAL | Status: DC
  Administered 2023-11-15 – 2023-11-23 (×24): 1000 mg via ORAL
  Filled 2023-11-15 (×25): qty 2

## 2023-11-15 NOTE — Progress Notes (Signed)
 Babs Arthea DASEN, MD  Physician Physical Medicine and Rehabilitation   Consult Note    Signed   Date of Service: 11/06/2023 10:35 AM  Related encounter: ED to Hosp-Admission (Discharged) from 11/04/2023 in [facility]   Signed     Expand All Collapse All           Physical Medicine and Rehabilitation Consult Reason for Consult: Impaired functional mobility and altered mental status Referring Physician: Cherlyn     HPI: Courtney Cline is a 89 y.o. female with history of breast cancer, atrial fibrillation, COPD, hypertension who presented on 11/04/2023 with altered mental status and confusion and was found to have acute metabolic encephalopathy in the setting of acute cystitis/sepsis.  She had stopped taking her Eliquis  due to recent nosebleeds.  Code stroke was called but CT of the head showed no acute process and neurology felt that presentation was most consistent with metabolic process as opposed to a stroke.  However MRI of the brain demonstrated a left thalamic capsular infarct.  MRA demonstrated left PCA P2 segment occlusion with intermittent distal reconstitution.  Patient was placed on IV ceftriaxone  for UTI and continued on Eliquis  and Lipitor .  Speech saw patient for dysphagia and recommended D3 diet.  Patient was up with therapy yesterday and was mod assist for sit to stand.  Gait was not attempted due to lightheadedness.  Patient required moderate assistance for basic ADLs.  Patient lives at home with spouse in 1 level house with 3 steps to enter.  Patient was modified independent with a rolling walker prior to admission but did have a history of falls over the last 6 months.       Home: Home Living Family/patient expects to be discharged to:: Inpatient rehab Living Arrangements: Spouse/significant other Available Help at Discharge: Family, Available 24 hours/day Type of Home: House Home Access: Stairs to enter [employer] of  Steps: 3 Entrance Stairs-Rails: Can reach both Home Layout: One level Bathroom Shower/Tub: Engineer, manufacturing systems:  (?) Home Equipment: Agricultural consultant (2 wheels), Information systems manager, Wheelchair - manual, BSC/3in1  Functional History: Prior Function Prior Level of Function : History of Falls (last six months), Needs assist Mobility Comments: ModI with RW, family assists with stair negotiation ADLs Comments: ind, sons help to drive Functional Status:  Mobility: Bed Mobility Overal bed mobility: Needs Assistance Bed Mobility: Rolling, Sidelying to Sit, Sit to Supine Rolling: Min assist, Used rails Sidelying to sit: Mod assist, Used rails Sit to supine: Min assist General bed mobility comments: MinA to roll with cues to reach for bed rail, ModA to raise trunk. MinA for return to supine for slight R LE Transfers Overall transfer level: Needs assistance Equipment used: Rolling walker (2 wheels) Transfers: Sit to/from Stand Sit to Stand: Mod assist, +2 physical assistance, +2 safety/equipment General transfer comment: ModAx2 for boost-up and to steady, pt started feeling lightheaded with return to sitting Ambulation/Gait General Gait Details: deferred 2/2 symptoms   ADL: ADL Overall ADL's : Needs assistance/impaired Eating/Feeding: Set up, Sitting Grooming: Minimal assistance, Sitting Upper Body Bathing: Moderate assistance Lower Body Bathing: Moderate assistance Upper Body Dressing : Moderate assistance Lower Body Dressing: Moderate assistance Toilet Transfer: Moderate assistance, +2 for physical assistance Toileting- Clothing Manipulation and Hygiene: Moderate assistance Functional mobility during ADLs: Moderate assistance, +2 for physical assistance, Rolling walker (2 wheels)   Cognition: Cognition Orientation Level: Oriented X4 Cognition Arousal: Alert Behavior During Therapy: WFL for tasks assessed/performed  Review of Systems  Constitutional:  Positive for  malaise/fatigue.  HENT: Negative.    Eyes: Negative.   Respiratory: Negative.    Cardiovascular: Negative.   Gastrointestinal: Negative.   Genitourinary: Negative.   Musculoskeletal: Negative.   Skin: Negative.   Neurological:  Positive for focal weakness and weakness.  Psychiatric/Behavioral:  The patient is not nervous/anxious.        Past Medical History:  Diagnosis Date   Allergic rhinitis     Arthritis     Breast calcification seen on mammogram      Right breast   Breast cancer (HCC)     COPD (chronic obstructive pulmonary disease) (HCC)     GERD (gastroesophageal reflux disease)     Hyperglycemia     Hyperlipidemia     Hypertension     Low back pain     On home oxygen  therapy      all the time   Osteopenia     Peripheral edema     Shortness of breath     Subclavian artery stenosis, left (HCC)     Vitamin D  deficiency     Wears dentures      top   Wears glasses      reading             Past Surgical History:  Procedure Laterality Date   BREAST BIOPSY       BREAST LUMPECTOMY        right 2015   BREAST LUMPECTOMY WITH RADIOACTIVE SEED LOCALIZATION Right 12/20/2013    Procedure: RIGHT BREAST LUMPECTOMY WITH RADIOACTIVE SEED LOCALIZATION;  Surgeon: Debby Shipper, MD;  Location: [facility];  Service: General;  Laterality: Right;   CAROTID DUPLEX   2014   CATARACT EXTRACTION        both eyes             Family History  Problem Relation Age of Onset   Emphysema Mother     Emphysema Maternal Grandfather          Social History:  reports that she quit smoking about 29 years ago. Her smoking use included cigarettes. She started smoking about 69 years ago. She has a 40 pack-year smoking history. She has never used smokeless tobacco. She reports that she does not drink alcohol  and does not use drugs. Allergies:  Allergies  No Known Allergies         Medications Prior to Admission  Medication Sig Dispense Refill   albuterol  (VENTOLIN  HFA)  108 (90 Base) MCG/ACT inhaler INHALE TWO PUFFS into THE lungs EVERY 6 HOURS AS NEEDED FOR wheezing OR SHORTNESS OF BREATH 8.5 g 6   apixaban  (ELIQUIS ) 2.5 MG TABS tablet TAKE ONE TABLET TWICE DAILY 60 tablet 5   Cholecalciferol  (VITAMIN D ) 2000 UNITS CAPS Take 2,000 Units by mouth daily.        Cyanocobalamin  (VITAMIN B 12) 500 MCG TABS Take 1 tablet by mouth daily.       Fluticasone -Umeclidin-Vilant (TRELEGY ELLIPTA ) 200-62.5-25 MCG/ACT AEPB Inhale 1 puff into the lungs daily. 60 each 11   furosemide  (LASIX ) 40 MG tablet Take 40 mg by mouth 2 (two) times daily.       hydrALAZINE  (APRESOLINE ) 25 MG tablet Take 25 mg by mouth 2 (two) times daily.       metoprolol  succinate (TOPROL -XL) 25 MG 24 hr tablet Take 25 mg by mouth at bedtime.       OXYGEN  Inhale 4 L into the lungs continuous.  rosuvastatin  (CRESTOR ) 40 MG tablet Take 40 mg by mouth daily.                Blood pressure (!) 117/54, pulse 84, temperature 98.7 F (37.1 C), temperature source Oral, resp. rate 18, height 5' 2 (1.575 m), weight 64.7 kg, SpO2 100%. Physical Exam Constitutional:      General: She is not in acute distress.    Appearance: She is ill-appearing.  HENT:     Head: Normocephalic and atraumatic.     Right Ear: External ear normal.     Left Ear: External ear normal.     Nose: Nose normal.     Mouth/Throat:     Mouth: Mucous membranes are moist.     Comments: dentures Eyes:     Conjunctiva/sclera: Conjunctivae normal.     Pupils: Pupils are equal, round, and reactive to light.  Cardiovascular:     Rate and Rhythm: Normal rate.  Pulmonary:     Effort: No respiratory distress.  Abdominal:     Palpations: Abdomen is soft.  Musculoskeletal:        General: Tenderness present. No swelling.     Cervical back: Normal range of motion.  Skin:    Findings: Bruising present.  Neurological:     Comments: Pt is alert. Oriented to person, year, month (with cues), place. Speech dysarthric. Language appears  intact. Delays in processing. Right central VII and tongue deviation. MMT: RUE: 4-/5 deltoid, biceps, triceps, 4/5 wrist and hand. LUE 4 to 4+/5 prox to distal. RLE 3- HF, KE and 4/5 ADF/PF. LLE 3+ to 4-/5 prox to 4+/5 distal. Senses LT and pain in all 4's. DTR's are 2+ right and 1 to 2+ on left. Toes down. No abnl resting tone.   Psychiatric:     Comments: Pt a little flat, but generally cooperative and pleasant.        Lab Results Last 24 Hours       Results for orders placed or performed during the hospital encounter of 11/04/23 (from the past 24 hours)  Hemoglobin A1c     Status: None    Collection Time: 11/05/23 11:08 AM  Result Value Ref Range    Hgb A1c MFr Bld 5.2 4.8 - 5.6 %    Mean Plasma Glucose 102.54 mg/dL  Lipid panel     Status: None    Collection Time: 11/06/23  1:41 AM  Result Value Ref Range    Cholesterol 99 0 - 200 mg/dL    Triglycerides 60 <849 mg/dL    HDL 46 >59 mg/dL    Total CHOL/HDL Ratio 2.2 RATIO    VLDL 12 0 - 40 mg/dL    LDL Cholesterol 41 0 - 99 mg/dL  Basic metabolic panel with GFR     Status: Abnormal    Collection Time: 11/06/23  1:41 AM  Result Value Ref Range    Sodium 139 135 - 145 mmol/L    Potassium 4.4 3.5 - 5.1 mmol/L    Chloride 103 98 - 111 mmol/L    CO2 25 22 - 32 mmol/L    Glucose, Bld 95 70 - 99 mg/dL    BUN 19 8 - 23 mg/dL    Creatinine, Ser 8.48 (H) 0.44 - 1.00 mg/dL    Calcium  8.6 (L) 8.9 - 10.3 mg/dL    GFR, Estimated 33 (L) >60 mL/min    Anion gap 11 5 - 15       Imaging Results (Last 48 hours)  VAS US  CAROTID Result Date: 11/05/2023 Carotid Arterial Duplex Study Patient Name:  MYEISHA KRUSER  Date of Exam:   11/05/2023 Medical Rec #: [id number]               Accession #:    [id number] Date of Birth: 1934-02-06                Patient Gender: F Patient Age:   30 years Exam Location:  [facility] [place] Procedure:      VAS US  CAROTID Referring Phys: MARSHA ADA  --------------------------------------------------------------------------------  Indications:       CVA. Risk Factors:      Hypertension, hyperlipidemia, Diabetes. Comparison Study:  Previous study on 4.10.2014. Performing Technologist: Edilia Elden Appl  Examination Guidelines: A complete evaluation includes B-mode imaging, spectral Doppler, color Doppler, and power Doppler as needed of all accessible portions of each vessel. Bilateral testing is considered an integral part of a complete examination. Limited examinations for reoccurring indications may be performed as noted.  Right Carotid Findings: +----------+--------+--------+--------+-------------------------+--------+           PSV cm/sEDV cm/sStenosisPlaque Description       Comments +----------+--------+--------+--------+-------------------------+--------+ CCA Prox  46                                                        +----------+--------+--------+--------+-------------------------+--------+ CCA Distal59      14              heterogenous and calcific         +----------+--------+--------+--------+-------------------------+--------+ ICA Prox  88      26              heterogenous and calcific         +----------+--------+--------+--------+-------------------------+--------+ ICA Mid   93      24                                                +----------+--------+--------+--------+-------------------------+--------+ ICA Distal76      29                                                +----------+--------+--------+--------+-------------------------+--------+ ECA       93                                                        +----------+--------+--------+--------+-------------------------+--------+ +----------+--------+-------+----------------+-------------------+           PSV cm/sEDV cmsDescribe        Arm Pressure (mmHG) +----------+--------+-------+----------------+-------------------+  [id number]             Multiphasic, WNL                    +----------+--------+-------+----------------+-------------------+ +---------+--------+--+--------+--+---------+ VertebralPSV cm/s75EDV cm/s14Antegrade +---------+--------+--+--------+--+---------+  Left Carotid Findings: +----------+--------+--------+--------+-------------------------+--------+           PSV cm/sEDV cm/sStenosisPlaque Description       Comments +----------+--------+--------+--------+-------------------------+--------+  CCA Prox  66      17                                                +----------+--------+--------+--------+-------------------------+--------+ CCA Distal54      12              heterogenous and calcific         +----------+--------+--------+--------+-------------------------+--------+ ICA Prox  112     27              heterogenous and calcific         +----------+--------+--------+--------+-------------------------+--------+ ICA Mid   154     30      1-39%                                     +----------+--------+--------+--------+-------------------------+--------+ ICA Distal114     39      1-39%                                     +----------+--------+--------+--------+-------------------------+--------+ ECA       61      8                                                 +----------+--------+--------+--------+-------------------------+--------+ +----------+--------+--------+----------------+-------------------+           PSV cm/sEDV cm/sDescribe        Arm Pressure (mmHG) +----------+--------+--------+----------------+-------------------+ [id number]              Multiphasic, WNL                    +----------+--------+--------+----------------+-------------------+ +---------+--------+--+--------+----------+ VertebralPSV cm/s74EDV cm/sRetrograde +---------+--------+--+--------+----------+ Retrograde flow noted in the left vertebral artery,  suggesting subclavian steal.  Summary: Right Carotid: Velocities in the right ICA are consistent with a 1-39% stenosis. Left Carotid: Velocities in the left ICA are consistent with a 1-39% stenosis. Vertebrals:  Right vertebral artery demonstrates antegrade flow. Left vertebral              artery demonstrates retrograde flow. Subclavians: Normal flow hemodynamics were seen in bilateral subclavian              arteries. *See table(s) above for measurements and observations.  Electronically signed by Penne Colorado MD on 11/05/2023 at 7:42:09 PM.    Final     MR ANGIO HEAD WO CONTRAST Result Date: 11/05/2023 EXAM: MR Angiography Head without intravenous Contrast. 11/05/2023 01:57:00 AM TECHNIQUE: Magnetic resonance angiography images of the head without intravenous contrast. Multiplanar 2D and 3D reformatted images are provided for review. COMPARISON: None provided. CLINICAL HISTORY: Stroke/TIA, determine embolic source. FINDINGS: ANTERIOR CIRCULATION: No significant stenosis of the internal carotid arteries. No significant stenosis of the anterior cerebral arteries. No significant stenosis of the middle cerebral arteries. No aneurysm. POSTERIOR CIRCULATION: There is occlusion of the left PCA distal P2 segment with intermittent distal reconstitution. No significant stenosis of the basilar artery. No significant stenosis of the vertebral arteries. No aneurysm. IMPRESSION: 1. Left PCA P2 segment occlusion with intermittent distal reconstitution. Electronically signed by: Franky Stanford MD  11/05/2023 02:11 AM EDT RP Workstation: [id number]    MR BRAIN WO CONTRAST Result Date: 11/04/2023 CLINICAL DATA:  Altered mental status, nontraumatic (Ped 0-17y) EXAM: MRI HEAD WITHOUT CONTRAST TECHNIQUE: Multiplanar, multiecho pulse sequences of the brain and surrounding structures were obtained without intravenous contrast. COMPARISON:  CT head from earlier today. FINDINGS: Brain: Acute left thalamocapsular infarct. No mass  effect. No midline shift. No acute hemorrhage. Moderate T2/FLAIR hyperintensities in the white matter which are compatible with chronic microvascular ischemic change. No mass lesion. Small amount of curvilinear susceptibility artifact along the posterior parietooccipital convexities bilaterally suggestive of superficial siderosis. Vascular: Major arterial flow voids are maintained at the skull base. Skull and upper cervical spine: Normal marrow signal. Sinuses/Orbits: Clear sinuses.  No acute orbital findings. Other: No mastoid effusions IMPRESSION: 1. Acute left thalamocapsular infarct. 2. Moderate chronic microvascular disease. 3. Suspected small amount of superficial siderosis along the parieto-occipital convexities. Electronically Signed   By: Gilmore GORMAN Molt M.D.   On: 11/04/2023 15:43       Assessment/Plan: Diagnosis: 89 year old female with left thalamocapsular infarct   Does the need for close, 24 hr/day medical supervision in concert with the patient's rehab needs make it unreasonable for this patient to be served in a less intensive setting? Yes Co-Morbidities requiring supervision/potential complications:  - Urosepsis -Poststroke dysphagia -Hypertension -Acute kidney injury -Atrial fibrillation Due to bladder management, bowel management, safety, skin/wound care, disease management, medication administration, and patient education, does the patient require 24 hr/day rehab nursing? Yes Does the patient require coordinated care of a physician, rehab nurse, therapy disciplines of PT, OT, SLP to address physical and functional deficits in the context of the above medical diagnosis(es)? Yes Addressing deficits in the following areas: balance, endurance, locomotion, strength, transferring, bowel/bladder control, bathing, dressing, feeding, grooming, toileting, cognition, speech, swallowing, and psychosocial support Can the patient actively participate in an intensive therapy program of at  least 3 hrs of therapy per day at least 5 days per week? Yes The potential for patient to make measurable gains while on inpatient rehab is excellent Anticipated functional outcomes upon discharge from inpatient rehab are supervision  with PT, supervision and min assist with OT, modified independent and supervision with SLP. Estimated rehab length of stay to reach the above functional goals is: 10-15 days Anticipated discharge destination: Home Overall Rehab/Functional Prognosis: excellent   POST ACUTE RECOMMENDATIONS: This patient's condition is appropriate for continued rehabilitative care in the following setting: CIR Patient has agreed to participate in recommended program. Yes Note that insurance prior authorization may be required for reimbursement for recommended care.   Comment: Pt has what appears to be a supportive family. She was independent with her rollator prior to this admission and wants to regain her functional mobility. Rehab Admissions Coordinator to follow up.         I have personally performed a face to face diagnostic evaluation of this patient. Additionally, I have examined the patient's medical record including any pertinent labs and radiographic images.     Thanks,   Arthea ONEIDA Gunther, MD 11/06/2023          Routing History

## 2023-11-15 NOTE — Plan of Care (Signed)
  Problem: Consults Goal: RH STROKE PATIENT EDUCATION Description: See Patient Education module for education specifics  Outcome: Progressing   Problem: RH BOWEL ELIMINATION Goal: RH STG MANAGE BOWEL WITH ASSISTANCE Description: STG Manage Bowel with supervision-minimal Assistance. Outcome: Progressing   Problem: RH BLADDER ELIMINATION Goal: RH STG MANAGE BLADDER WITH ASSISTANCE Description: STG Manage Bladder With min- supervision Assistance Outcome: Progressing   Problem: RH SKIN INTEGRITY Goal: RH STG SKIN FREE OF INFECTION/BREAKDOWN Description: Manage skin free of infection/breakdown with min- supervision assistance  Outcome: Progressing   Problem: RH SAFETY Goal: RH STG ADHERE TO SAFETY PRECAUTIONS W/ASSISTANCE/DEVICE Description: STG Adhere to Safety Precautions With min- supervision Assistance/Device. Outcome: Progressing   Problem: RH PAIN MANAGEMENT Goal: RH STG PAIN MANAGED AT OR BELOW PT'S PAIN GOAL Description: <4 w/ prns Outcome: Progressing   Problem: RH KNOWLEDGE DEFICIT Goal: RH STG INCREASE KNOWLEDGE OF HYPERTENSION Description: Manage increase knowledge of hypertension with min- supervision assistance from sons using educational materials provided Outcome: Progressing Goal: RH STG INCREASE KNOWLEDGE OF DYSPHAGIA/FLUID INTAKE Description: Manage increase knowledge of dysphagia/fluid intake with min- supervision assistance from sons using educational materials provided Outcome: Progressing Goal: RH STG INCREASE KNOWLEGDE OF HYPERLIPIDEMIA Description: Manage increase knowledge of hyperlipidemia with min- supervision assistance from sons using educational materials provided Outcome: Progressing Goal: RH STG INCREASE KNOWLEDGE OF STROKE PROPHYLAXIS Description: Manage increase knowledge of stroke prophylaxis with min- supervision assistance from sons using educational materials provided Outcome: Progressing

## 2023-11-15 NOTE — Progress Notes (Signed)
 Occupational Therapy Session Note  Patient Details  Name: Courtney Cline MRN: [id number] Date of Birth: 07-29-1933  Today's Date: 11/15/2023 OT Individual Time: 9192-9096 OT Individual Time Calculation (min): 56 min   Today's Date: 11/15/2023 OT Individual Time: 1035-1130 OT Individual Time Calculation (min): 55 min   Short Term Goals: Week 1:  OT Short Term Goal 1 (Week 1): Pt will perform toilet transfers with Min A + LRAD. OT Short Term Goal 2 (Week 1): Pt will tolerate >2 mins of standing with Min A + LRAD, in preparation for standing ADLs. OT Short Term Goal 3 (Week 1): Pt will thread LB garments with Min A.  Skilled Therapeutic Interventions/Progress Updates:   Session 1:  Pt greeted resting in bed, reports of BLE aching, pain medication previously administered. Pt transitions to EOB with CGA due to posterior bias.  Multiple attempts at sit>stand from EOB with patient demo's R lateral and posterior bias, requiring Min A to prevent LOB. Stand-step to WC with Min A + RW. LB dressing with assistance for unthreading/threading, attempted use of reacher but not effective due to increased skin/hand sensitivity. Standing pericare and hiking with Min A for standing balance. UB care with setup/supervision. Extended time provided throughout ADL for seated rests, cuing for breathing in through nose for O2 consumption. Received and remained on 4L O2 [place]. Pt remained sitting in WC with all immediate needs met, posey belt activated.   Session 2:   Pt greeted sitting in WC, continued complaints of BLE tenderness/ache while activity. Pre-medicated, rest provided as needed. Pt dependent for transport from room<>day room for time/energy management. Pt instructed in 3x2 mins of Kinetron modality for BLE strengthening/endurance required during ADL participation. Pt tolerates resistance of 60 CM/SEC with extended rest breaks provided. Reacher then reintroduced for LB dressing, patient completing  simulated LB dressing activity with Max multimodal cuing for sequencing of use of AE. Alteranating toe taps attempted, patient with increased difficulty maintaining static balance with posterior/R lateral bias, therefore activity downgraded to blocked-practice sit<>stands into static stance with mirror placed for visual feedback. Emphasis placed on wider BOS, terminal R knee extension, and forward weight-shift onto toes. Pt remained sitting on WC with all immediate needs met, posey belt activated.   Therapy Documentation Precautions:  Precautions Precautions: Fall Recall of Precautions/Restrictions: Impaired Precaution/Restrictions Comments: Watch HR and BP; 4L O2 [place] Restrictions Weight Bearing Restrictions Per Provider Order: No   Therapy/Group: Individual Therapy  [name], OTR/L, MSOT  11/15/2023, 7:54 AM

## 2023-11-15 NOTE — Progress Notes (Signed)
 Physical Therapy Session Note  Patient Details  Name: Courtney Cline MRN: [id number] Date of Birth: [birth year]  Today's Date: 11/15/2023 PT Individual Time: 1430-1530 PT Individual Time Calculation (min): 60 min   Short Term Goals: Week 1:  PT Short Term Goal 1 (Week 1): Pt will transfer sup to sit w/ min A consistently. PT Short Term Goal 2 (Week 1): Pt will transfer st to stand w/ min A consistently. PT Short Term Goal 3 (Week 1): PT to assess gait PT Short Term Goal 4 (Week 1): Pt to assess stairs.  Skilled Therapeutic Interventions/Progress Updates:    Pt presents in room in [facility], agreeable to PT. Pt denies pain, states feeling better today compared to yesterday. Session focused on gait training and therapeutic activities to promote improved body mechanics and sequencing for transfers. Pt transported to day room via WC, pt on portable supplemental O2 4L throughout session. Pt with notable decrease in endurance this session unable to walk same distance as yesterday, also with difficulty completing sit to stands and stand step transfers. Pt completes sit to stands with CGA up to mod assist with max cues for sequencing throughout session. Pt ambulates 26' and 42' with RW min up to mod assist +2 WC follow for safety, assist needed for postural stability, max cues for sequencing, foot placement and upright posture as well as terminal hip/knee extension and managing RW. Pt requires extended seated rest breaks secondary to fatigue, vitals WNL. Pt completes x5 sit<>stands with max verbal cues for sequencing hand placement and upright posture requiring min to CGA, demonstrating R lateral lean with sitting. Pt then completes x2 stand step transfers mat<>WC with max verbal cues for sequencing, mod assist progress to max assist on first attempt with pt demonstrating significantly poor sequencing for task despite cues requiring max assist for hips to [facility] for safety. Pt changed to stedy transfer in room  with nursing staff due to pt inconsistency with transfers, pt nurse tech and nurse notified and safety plan updated. Pt educated on inconsistencies and change in safety plan with pt verbalizing understanding. Pt returns to room and remains seated in [facility] with all needs within reach, cal light in place and chair alarm donned and activated at end of session.   Therapy Documentation Precautions:  Precautions Precautions: Fall Recall of Precautions/Restrictions: Impaired Precaution/Restrictions Comments: Watch HR and BP; 4L O2 [place] Restrictions Weight Bearing Restrictions Per Provider Order: No     Therapy/Group: Individual Therapy  Reche Ohara PT, DPT 11/15/2023, 5:04 PM

## 2023-11-15 NOTE — Plan of Care (Signed)
  Problem: Consults Goal: RH STROKE PATIENT EDUCATION Description: See Patient Education module for education specifics  Outcome: Progressing   Problem: RH BOWEL ELIMINATION Goal: RH STG MANAGE BOWEL WITH ASSISTANCE Description: STG Manage Bowel with supervision-minimal Assistance. Outcome: Progressing   Problem: RH BLADDER ELIMINATION Goal: RH STG MANAGE BLADDER WITH ASSISTANCE Description: STG Manage Bladder With min- supervision Assistance Outcome: Progressing   Problem: RH SKIN INTEGRITY Goal: RH STG SKIN FREE OF INFECTION/BREAKDOWN Description: Manage skin free of infection/breakdown with min- supervision assistance  Outcome: Progressing   Problem: RH SAFETY Goal: RH STG ADHERE TO SAFETY PRECAUTIONS W/ASSISTANCE/DEVICE Description: STG Adhere to Safety Precautions With min- supervision Assistance/Device. Outcome: Progressing

## 2023-11-15 NOTE — Progress Notes (Signed)
 Nutrition Follow Up  DOCUMENTATION CODES:   Non-severe (moderate) malnutrition in context of chronic illness  INTERVENTION:   Continue Ensure Plus High Protein po TID, each supplement provides 350 kcal and 20 grams of protein  Encouraged trying to continue to increase intake at each meal to help meet calorie and protein needs  MVI with minerals daily  Provided Ensure coupons to pt for preparation of discharge  NUTRITION DIAGNOSIS:   Moderate Malnutrition related to chronic illness as evidenced by mild fat depletion, moderate fat depletion, mild muscle depletion, moderate muscle depletion, percent weight loss. New diagnosis  GOAL:   Patient will meet greater than or equal to 90% of their needs Progressing  MONITOR:   PO intake, Supplement acceptance  REASON FOR ASSESSMENT:   Consult Assessment of nutrition requirement/status, Poor PO  ASSESSMENT:   89 yo female admitted with functional deficits secondary to left thalamic capsular infarct. PMH includes HTN, HLD, HF, persistent A fib, GERD, COPD on 4 L oxygen  at baseline, osteopenia, vitamin D  deficiency, breast cancer.  9/24 admitted to CIR 9/25 DYS 2 diet w/ thins 9/29 downgraded to DYS 1 w/ thins (pt blends food at home at baseline)  Pt reports continued poor appetite. States her appetite prior to admission was poor and she may be back at baseline but she knows she has been losing weight and needs to eat more. Diet documentation shows pt consuming on average 28% of meals. Pt does report liking Ensure shakes, will increase offering to TID to promote adequate intake of calories and protein while appetite remains poor. Pt does not like Magic cups, will discontinue those. No GI discomforts reported at this time. Encouraged pt to continue to work on increasing intake and drinking supplements. Pt requested DYS 1 diet since she usually blenderizes food at home.   Conducted physical exam and pt has mild to moderate fat depletions  with mild to severe muscle depletions. Pt meets criteria for malnutrition in the context on chronic illness. Suspect some of the more severe depletions are a combination of atrophy and malnutrition, pt likely only meets criteria for moderate malnutrition at this time. Pt has also experienced 10% wt loss in 6 weeks which is concerning and clinically significant. Will continue to monitor intake and watch for any weight trends.   Average Meal Completion: 9/29-10/1: 28% average intake x 8 recorded meals  Medications: Ferrous fumarate Vitamin C 250 mg daily Vitamin B12 50 mcg daily Folic acid 1 mg daily Megace BID Mirtazapine   MVI w/ minerals Protonix  Miralax  Senna  Labs reviewed   NUTRITION - FOCUSED PHYSICAL EXAM:  Flowsheet Row Most Recent Value  Orbital Region Moderate depletion  Upper Arm Region Mild depletion  Thoracic and Lumbar Region No depletion  Buccal Region Mild depletion  Temple Region Moderate depletion  Clavicle Bone Region Severe depletion  Clavicle and Acromion Bone Region Severe depletion  Scapular Bone Region Severe depletion  Dorsal Hand Severe depletion  Patellar Region Mild depletion  Anterior Thigh Region Mild depletion  Posterior Calf Region Mild depletion  Edema (RD Assessment) None  Hair Reviewed  Eyes Reviewed  Mouth Reviewed  Skin Reviewed  Nails Reviewed     Diet Order:   Diet Order             DIET - DYS 1 Room service appropriate? Yes; Fluid consistency: Thin  Diet effective now                   EDUCATION NEEDS:  Education needs have been addressed  Skin:  Skin Assessment: Reviewed RN Assessment  Last BM:  9/30 type 4  Height:   Ht Readings from Last 1 Encounters:  11/08/23 5' 2 (1.575 m)    Weight:   Wt Readings from Last 1 Encounters:  11/15/23 64 kg    Ideal Body Weight:  50 kg  BMI:  Body mass index is 25.81 kg/m.  Estimated Nutritional Needs:   Kcal:  1500-1700  Protein:  75-95g  Fluid:   1.5-1.7L    Josette Glance, MS, RDN, LDN Clinical Dietitian I Please reach out via secure chat

## 2023-11-15 NOTE — Progress Notes (Signed)
 [name], MD Physician Physical Medicine and Rehabilitation   PMR Pre-admission    Signed   Date of Service: 11/06/2023  2:54 PM  Related encounter: ED to Hosp-Admission (Discharged) from 11/04/2023 in [facility]   Signed     Expand All Collapse All  PMR Admission Coordinator Pre-Admission Assessment   Patient: Courtney Cline is an 89 y.o., female MRN: [id number] DOB: January 05, 1934 Height: 5' 2 (157.5 cm) Weight: 64.7 kg                                                                                                                                                  Insurance Information HMO: yes    PPO:      PCP:      IPA:      80/20:      OTHER:  PRIMARY: [employer]      Policy#: [id number]      Subscriber: patient CM Name: Luke HERO      Phone#: [phone]     Fax#: [phone] Pre-Cert#: [id number]      Employer:  Benefits:  Phone #: n/a-online at [email or web address]     Name:  Eff. Date: 02/15/23-02/14/24     Deduct: does not have one      Out of Pocket Max: $6,750 [id number] met)      Life Max: NA  CIR: $399/day co-pay with a max co-pay of $2,793/admission      SNF: $10/day co-pay for days 1-20, $214/day co-pay for days 21-100 Outpatient: $25 co-pay/visit     Co-Pay:  Home Health: 100% coverage      Co-Pay:  DME: 80% coverage     Co-Pay: 20% co-insurance Providers: in-network SECONDARY:       Policy#:       Phone#:    Artist:       Phone#:    The Data processing manager" for patients in Inpatient Rehabilitation Facilities with attached "Privacy Act Statement-Health Care Records" was provided and verbally reviewed with: Patient and Family   Emergency Contact Information Contact Information       Name Relation Home Work [place]     [phone]         Other Contacts       Name Relation Home Work Mobile    [place] Son     [phone]     Ashana, Tullo     [phone]         Current Medical History  Patient Admitting Diagnosis: CVA History of Present Illness: Pt is an 89 year old female with medical hx significant for: A-fib, breast CA, COPD, HTN, HLD. Pt presented to [facility] as a code stroke on 11/04/23 d/t slurred speech after she slid off couch. Pt also had right  LE weakness. CT head negative for acute abnormalities. Urinalysis consistent with UTI. Started on Rocephin .Chest x-ray showed evidence of basilar opacity-infiltrate versus atelectasis. Also started on azithromycin . MRI revealed acute left thalamocapsular infarct. MRA showed left PCA P2 segment occlusion with intermittent distal reconstitution. Therapy evaluations completed and CIR recommended d/t pt's deficits in functional mobility.  Complete NIHSS TOTAL: 0 Glasgow Coma Scale Score: 15   Patient's medical record from [facility] has been reviewed by the rehabilitation admission coordinator and physician.   Past Medical History      Past Medical History:  Diagnosis Date   Allergic rhinitis     Arthritis     Breast calcification seen on mammogram      Right breast   Breast cancer (HCC)     COPD (chronic obstructive pulmonary disease) (HCC)     GERD (gastroesophageal reflux disease)     Hyperglycemia     Hyperlipidemia     Hypertension     Low back pain     On home oxygen  therapy      all the time   Osteopenia     Peripheral edema     Shortness of breath     Subclavian artery stenosis, left (HCC)     Vitamin D  deficiency     Wears dentures      top   Wears glasses      reading          Has the patient had major surgery during 100 days prior to admission? No   Family History  family history includes Emphysema in her maternal grandfather and mother.     Current Medications   Current Medications    Current Facility-Administered Medications:    acetaminophen  (TYLENOL ) tablet 650 mg, 650 mg, Oral, Q6H PRN, 650 mg at  11/06/23 0617 **OR** acetaminophen  (TYLENOL ) suppository 650 mg, 650 mg, Rectal, Q6H PRN, Howerter, Justin B, DO   apixaban  (ELIQUIS ) tablet 2.5 mg, 2.5 mg, Oral, BID, Howerter, Justin B, DO, 2.5 mg at 11/06/23 0907   atorvastatin  (LIPITOR ) tablet 80 mg, 80 mg, Oral, Daily, Akula, Vijaya, MD, 80 mg at 11/06/23 0907   budesonide -glycopyrrolate -formoterol  (BREZTRI ) 160-9-4.8 MCG/ACT inhaler 2 puff, 2 puff, Inhalation, BID, Georgina Basket, MD, 2 puff at 11/06/23 9180   cefTRIAXone  (ROCEPHIN ) 1 g in sodium chloride  0.9 % 100 mL IVPB, 1 g, Intravenous, Q24H, Howerter, Justin B, DO, Stopped at 11/05/23 1921   hydrALAZINE  (APRESOLINE ) tablet 25 mg, 25 mg, Oral, BID, Georgina Basket, MD, 25 mg at 11/06/23 9092   ipratropium-albuterol  (DUONEB) 0.5-2.5 (3) MG/3ML nebulizer solution 3 mL, 3 mL, Nebulization, Q6H PRN, Georgina Basket, MD   melatonin tablet 3 mg, 3 mg, Oral, QHS PRN, Howerter, Justin B, DO   metoprolol  succinate (TOPROL -XL) 24 hr tablet 25 mg, 25 mg, Oral, QHS, Opyd, Timothy S, MD, 25 mg at 11/05/23 2300   ondansetron  (ZOFRAN ) injection 4 mg, 4 mg, Intravenous, Q6H PRN, Howerter, Justin B, DO   senna (SENOKOT) tablet 8.6 mg, 1 tablet, Oral, Daily PRN, Opyd, Timothy S, MD     Patients Current Diet:  Diet Order                  DIET DYS 3 Room service appropriate? Yes with Assist; Fluid consistency: Thin  Diet effective 1000                         Precautions / Restrictions Precautions Precautions: Fall Precaution/Restrictions Comments: watch HR and  low BP, 4L at home Restrictions Weight Bearing Restrictions Per Provider Order: No    Has the patient had 2 or more falls or a fall with injury in the past year?No   Prior Activity Level Limited Community (1-2x/wk): MD appointments   Prior Functional Level Prior Function Prior Level of Function : History of Falls (last six months), Needs assist Mobility Comments: ModI with RW, family assists with stair negotiation ADLs Comments:  ind, sons help to drive   Self Care: Did the patient need help bathing, dressing, using the toilet or eating?  Independent   Indoor Mobility: Did the patient need assistance with walking from room to room (with or without device)? Independent   Stairs: Did the patient need assistance with internal or external stairs (with or without device)? Independent   Functional Cognition: Did the patient need help planning regular tasks such as shopping or remembering to take medications? Independent   Patient Information Are you of Hispanic, Latino/a,or Spanish origin?: A. No, not of Hispanic, Latino/a, or Spanish origin What is your race?: A. White Do you need or want an interpreter to communicate with a doctor or health care staff?: 0. No   Patient's Response To:  Health Literacy and Transportation Is the patient able to respond to health literacy and transportation needs?: Yes Health Literacy - How often do you need to have someone help you when you read instructions, pamphlets, or other written material from your doctor or pharmacy?: Never In the past 12 months, has lack of transportation kept you from medical appointments or from getting medications?: No In the past 12 months, has lack of transportation kept you from meetings, work, or from getting things needed for daily living?: No   Home Assistive Devices / Equipment Home Equipment: Agricultural consultant (2 wheels), Information systems manager, Wheelchair - manual, BSC/3in1   Prior Device Use: Indicate devices/aids used by the patient prior to current illness, exacerbation or injury? Walker/rollator   Current Functional Level Cognition   Orientation Level: Oriented X4    Extremity Assessment (includes Sensation/Coordination)   Upper Extremity Assessment: Right hand dominant, Generalized weakness, RUE deficits/detail RUE Deficits / Details: 4/5 globally compared to LUE  Lower Extremity Assessment: Defer to PT evaluation RLE Deficits / Details: Hip flexion  3+/5, Knee ext 4+/5, Ankle DF 3/5 RLE Sensation: WNL LLE Deficits / Details: Hip flexion 4/5, Knee ext 4+/5, Ankle DF 3+/5 LLE Sensation: WNL     ADLs   Overall ADL's : Needs assistance/impaired Eating/Feeding: Set up, Sitting Grooming: Minimal assistance, Sitting Upper Body Bathing: Moderate assistance Lower Body Bathing: Moderate assistance Upper Body Dressing : Moderate assistance Lower Body Dressing: Moderate assistance Toilet Transfer: Moderate assistance, +2 for physical assistance Toileting- Clothing Manipulation and Hygiene: Moderate assistance Functional mobility during ADLs: Moderate assistance, +2 for physical assistance, Rolling walker (2 wheels)     Mobility   Overal bed mobility: Needs Assistance Bed Mobility: Rolling, Sidelying to Sit, Sit to Supine Rolling: Min assist, Used rails Sidelying to sit: Mod assist, Used rails Sit to supine: Min assist General bed mobility comments: MinA to roll with cues to reach for bed rail, ModA to raise trunk. MinA for return to supine for slight R LE     Transfers   Overall transfer level: Needs assistance Equipment used: Rolling walker (2 wheels) Transfers: Sit to/from Stand Sit to Stand: Mod assist, +2 physical assistance, +2 safety/equipment General transfer comment: ModAx2 for boost-up and to steady, pt started feeling lightheaded with return to sitting  Ambulation / Gait / Stairs / Wheelchair Mobility   Ambulation/Gait General Gait Details: deferred 2/2 symptoms     Posture / Balance Dynamic Sitting Balance Sitting balance - Comments: MaxA for occasional posterior and right lateral losses of balance. Able to correct with cueing however unable to maintain without MinA Balance Overall balance assessment: Needs assistance, Mild deficits observed, not formally tested Sitting-balance support: No upper extremity supported, Feet supported Sitting balance-Leahy Scale: Poor Sitting balance - Comments: MaxA for occasional  posterior and right lateral losses of balance. Able to correct with cueing however unable to maintain without MinA Postural control: Posterior lean, Right lateral lean Standing balance support: Bilateral upper extremity supported, During functional activity, Reliant on assistive device for balance Standing balance-Leahy Scale: Poor Standing balance comment: reliant on UE and external support     Special considerations/ Life events Oxygen  4L Bladder and Bowel Incontinence and Skin Erythema/Redness: sacrum        Previous Home Environment (from acute therapy documentation) Living Arrangements: Spouse/significant other  Lives With: Spouse Available Help at Discharge: Family, Available 24 hours/day Type of Home: House Home Layout: One level Home Access: Stairs to enter Entrance Stairs-Rails: Right, Left Entrance Stairs-Number of Steps: 4-5 Bathroom Shower/Tub: Engineer, manufacturing systems: Standard Bathroom Accessibility: Yes How Accessible: Accessible via walker Home Care Services: No   Discharge Living Setting Plans for Discharge Living Setting: Patient's home Type of Home at Discharge: House Discharge Home Layout: One level Discharge Home Access: Stairs to enter Entrance Stairs-Rails: Right, Left Entrance Stairs-Number of Steps: 4-5 Discharge Bathroom Shower/Tub: Tub/shower unit Discharge Bathroom Toilet: Standard Discharge Bathroom Accessibility: Yes How Accessible: Accessible via walker Does the patient have any problems obtaining your medications?: No   Social/Family/Support Systems Anticipated Caregiver: [name] (son), [name] (son) Anticipated Caregiver's Contact Information: [name]: [phone] Caregiver Availability: 24/7 Discharge Plan Discussed with Primary Caregiver: Yes Is Caregiver In Agreement with Plan?: Yes Does Caregiver/Family have Issues with Lodging/Transportation while Pt is in Rehab?: No     Goals Patient/Family Goal for Rehab: Supervision: PT,  Superivison-Min A: OT, Mod I-Superivsion: ST Expected length of stay: 10-15 days Pt/Family Agrees to Admission and willing to participate: Yes Program Orientation Provided & Reviewed with Pt/Caregiver Including Roles  & Responsibilities: Yes     Decrease burden of Care through IP rehab admission: NA     Possible need for SNF placement upon discharge:Not anticipated     Patient Condition: This patient's condition remains as documented in the consult dated 11/06/23, in which the Rehabilitation Physician determined and documented that the patient's condition is appropriate for intensive rehabilitative care in an inpatient rehabilitation facility. Will admit to inpatient rehab today.   Preadmission Screen Completed By:  Tinnie SHAUNNA Yvone Delayne, CCC-SLP, updates by Reche Lowers, PT, DPT 11/06/2023 2:54 PM ______________________________________________________________________   Discussed status with Dr. Lorilee on 11/08/23 at 2:05 PM  and received approval for admission today.   Admission Coordinator:  Tinnie SHAUNNA Yvone Delayne, time 2:05 PM Pattricia 11/08/23             Revision History

## 2023-11-15 NOTE — Progress Notes (Addendum)
 Patient ID: Courtney Cline, female   DOB: Jun 16, 1933, 89 y.o.   MRN: [id number]  [id number]- SW spoke with pt son [name] to provide updates from team conference. He is aware of family edu and will follow-up with his brother [name]. SW shared will provide updates.   *SW received phone call from pt son [name] confirming family edu on Monday 8am-11am. SW called him back to confirm.  Graeme Jude, MSW, LCSW Office: [phone] Cell: [phone] Fax: [phone]

## 2023-11-15 NOTE — Progress Notes (Signed)
 Speech Language Pathology Daily Session Note  Patient Details  Name: Courtney Cline MRN: [id number] Date of Birth: Aug 09, 1933  Today's Date: 11/15/2023 SLP Individual Time: 1300-1400 SLP Individual Time Calculation (min): 60 min  Short Term Goals: Week 1: SLP Short Term Goal 1 (Week 1): Pt will solve mildly complex problems w/ supervisionA SLP Short Term Goal 2 (Week 1): Pt will complete mildly specific word finding tasks w/ supervisionA SLP Short Term Goal 3 (Week 1): Pt will tolerate the lest restrictive diet w/ supervisionA SLP Short Term Goal 4 (Week 1): Pt willl recall recent/relevant info w/ supervisionA  Skilled Therapeutic Interventions:   Pt greeted at bedside. She was up in her [facility] and very agreeable to tx tasks targeting cognition. She was able to recall her d/c as reviewed yesterday as well as her OT tx session. Her lunch tray arrived (late) and she benefited from supervisionA for problem solving during set up of tray. No difficulties w/ oral clearance and no overt s/s of airway invasion were evident. After, she benefited from minA cues for information processing and working memory to complete functional money management task via menu. At the end of tx tasks, she was left in her chair w/ the alarm set and call light within reach. Recommend cont ST per POC.   Pain  No pain reported  Therapy/Group: Individual Therapy  [name] 11/15/2023, 1:58 PM

## 2023-11-15 NOTE — Telephone Encounter (Signed)
 Order has been placed.

## 2023-11-15 NOTE — Progress Notes (Signed)
 PROGRESS NOTE   Subjective/Complaints:  No events overnight.  Some alternating tachycardia 101 and bradycardia 54 overnight.  Patient asymptomatic. Satting 100% on 2 L nasal cannula Ate 30% of meals yesterday, 75% of breakfast this a.m. she does feel her appetite is improving somewhat.  Small bowel movement overnight  ROS: Denies fevers, chills, N/V, abdominal pain, constipation, diarrhea, SOB, cough, chest pain, new weakness or paraesthesias.   Poor appetite Shortness of breath  Objective:   No results found. Recent Labs    11/13/23 0552 11/14/23 0531  WBC 6.1  --   HGB 7.8* 8.1*  HCT 26.3* 26.7*  PLT 225  --     Recent Labs    11/13/23 0552 11/14/23 0531  NA 142 142  K 4.3 4.0  CL 107 108  CO2 26 28  GLUCOSE 76 82  BUN 25* 21  CREATININE 1.46* 1.29*  CALCIUM  8.6* 8.6*     Intake/Output Summary (Last 24 hours) at 11/15/2023 0747 Last data filed at 11/15/2023 0736 Gross per 24 hour  Intake 300 ml  Output --  Net 300 ml        Physical Exam: Vital Signs Blood pressure (!) 136/45, pulse (!) 54, temperature 98.4 F (36.9 C), temperature source Oral, resp. rate 18, height 5' 2 (1.575 m), weight 64 kg, SpO2 100%.  Constitutional: No apparent distress. Appropriate appearance for age.  Reclining in bed. HENT: No JVD. Neck Supple. Trachea midline. Atraumatic, normocephalic. Eyes: PERRLA. EOMI. Visual fields grossly intact.  Cardiovascular: RRR, no murmurs/rub/gallops.  1+ bilateral lower extremity edema.  Unchanged peripheral pulses 2+  Respiratory: Global mostly diminished, clear to auscultation bilaterally,, no rales, rhonchi, or wheezing.  On 2 L nasal cannula--stable Abdomen: + bowel sounds, normoactive. No distention or tenderness. Soft.  Skin: skin tear RLE covered with tegaderm--inferior bruising extending.  MSK: No apparent deformity.     Neurologic exam:  Cognition: AAO to person, place, time   + Mild memory deficits, otherwise cognition intact.  Within normal limits for age. Insight: Good  insight into current condition.  Mood: Pleasant affect, appropriate mood.  Sensation: Equal and intact in BL UE and Les.  Reflexes:  Negative Hoffman's and babinski signs bilaterally.  CN: 2-12 grossly intact.  Coordination: No apparent tremors. No ataxia on FTN, HTS bilaterally.  Spasticity: MAS 0 in all extremities.    Assessment/Plan: 1. Functional deficits which require 3+ hours per day of interdisciplinary therapy in a comprehensive inpatient rehab setting. Physiatrist is providing close team supervision and 24 hour management of active medical problems listed below. Physiatrist and rehab team continue to assess barriers to discharge/monitor patient progress toward functional and medical goals  Care Tool:  Bathing  Bathing activity did not occur: Safety/medical concerns (Limited eval due to elevated/variable HR (67-135 bpm).)           Bathing assist       Upper Body Dressing/Undressing Upper body dressing Upper body dressing/undressing activity did not occur (including orthotics): Safety/medical concerns (Limited eval due to elevated/variable HR (67-135 bpm).)      Upper body assist      Lower Body Dressing/Undressing Lower body dressing    Lower body dressing  activity did not occur: Safety/medical concerns (Limited eval due to elevated/variable HR (67-135 bpm).)       Lower body assist       Toileting Toileting    Toileting assist Assist for toileting: 2 Helpers     Transfers Chair/bed transfer  Transfers assist  Chair/bed transfer activity did not occur: Safety/medical concerns        Locomotion Ambulation   Ambulation assist   Ambulation activity did not occur: Safety/medical concerns          Walk 10 feet activity   Assist  Walk 10 feet activity did not occur: Safety/medical concerns        Walk 50 feet activity   Assist Walk  50 feet with 2 turns activity did not occur: Safety/medical concerns         Walk 150 feet activity   Assist Walk 150 feet activity did not occur: Safety/medical concerns         Walk 10 feet on uneven surface  activity   Assist Walk 10 feet on uneven surfaces activity did not occur: Safety/medical concerns         Wheelchair     Assist Is the patient using a wheelchair?: Yes Type of Wheelchair: Manual Wheelchair activity did not occur: Safety/medical concerns         Wheelchair 50 feet with 2 turns activity    Assist    Wheelchair 50 feet with 2 turns activity did not occur: Safety/medical concerns       Wheelchair 150 feet activity     Assist  Wheelchair 150 feet activity did not occur: Safety/medical concerns       Blood pressure (!) 136/45, pulse (!) 54, temperature 98.4 F (36.9 C), temperature source Oral, resp. rate 18, height 5' 2 (1.575 m), weight 64 kg, SpO2 100%.  Medical Problem List and Plan: 1. Functional deficits secondary to left thalamic capsular infarct             -patient may shower             -ELOS/Goals: 10-14 days S - 10/9             -Stable to continue CIR   - 9/30: Lives with husband who cannot provide assist; supervision goals currently, setup for UBD and Mod-Max LBD. Activity tolerance remains poor as well as balance. Min A goals and CGA for transfers goals. Mod I goals SLP. Per son not far off her bsaeline for cog. Dys 1 diet downgrade  -  apparently blended all her foods at home.    2.  Antithrombotics: -DVT/anticoagulation:  Mechanical: Sequential compression devices, below knee Bilateral lower extremities Pharmaceutical: Eliquis              -antiplatelet therapy: Eliquis  2.5 mg twice daily--tolerating  3. Pain Management: Tylenol  as needed   - 9/30: Start gabapentin 100 mg TID for LE neuropathy, pain  10-1: Ongoing severe back, head pain overnight.  Scheduled Tylenol  1000 mg 3 times daily.  4.  Mood/Behavior/Sleep: LCSW to follow for evaluation and support when available.              -antipsychotic agents: [name]             - Continue melatonin 3 mg as needed at bedtime, also has trazodone  PRN 5. Neuropsych/cognition: This patient is capable of making decisions on her own behalf. 6. Skin/Wound Care: Routine pressure relief measures   - 9/30: R anterior skin tear from fall ;  eucerin daily for dry skin  7. Fluids/Electrolytes/Nutrition: Monitor I&O Daily weights with follow-up labs. Continue ensure. Continue vitamins/supplements.              - SLP following; on DYS 3-- now DYS 2 - 9/26: Poor p.o. intakes for several months per family due to lack of appetite since finishing an antibiotic.  Nutrition consulted for recommendations, will start mirtazapine  7.5 mg nightly for appetite. - 9/29: P.o. intakes remain poor.  Start Megace 200 mg twice daily. 9-30: Downgraded to dysphagia 1 diet due to blending foods at home.  Monitor intakes on Megace.--Somewhat improved 10-:     8.  A-fib: History of noncompliance with Eliquis .              - Resumed Eliquis              - continue Metoprolol  25 mg nightly for rate control, currently stable - 9/25: A-fib with RVR into the 150s with mobilization this a.m.  Got additional dose of metoprolol  this a.m. for coverage.  Cardiology consulted, appreciate recommendations 9-26: Cardiology switched from long-acting metoprolol  to Lopressor  25 mg twice daily; patient is tolerating this much better.  DC IV fluids. -11/11/23 rate controlled, doing well on this regimen 10-1: Some rate variations overnight, staying between 55 and 101.  Overall stay within parameters.   9.  HTN/HFpEF: continue hydralazine  25 mg twice daily and metoprolol              - Monitor blood pressures with increased activity - 9-25: Hypotensive into the 80s with mobilization; hold hydralazine , continue metoprolol  for rate control, will give 1 L IV fluid today.  Cardiology consult pending. -  9/26: Blood pressure much better today, 140s over 50s.  Daily weight stable. DC IV fluids, monitor.    -9/27-28/25 BPs improving, wt stable, monitor.   10-1: Blood pressure stable 120s to 130s, 50s diastolic.  Continue current regimen.  Monitor.  Vitals:   11/12/23 1519 11/12/23 1907 11/13/23 0405 11/13/23 0813  BP: (!) 122/57 (!) 120/50 (!) 102/53 (!) 125/59   11/13/23 0827 11/13/23 1708 11/13/23 2006 11/14/23 0510  BP: (!) 125/59 (!) 133/42 (!) 132/38 (!) 132/49   11/14/23 1440 11/14/23 2008 11/14/23 2053 11/15/23 0444  BP: (!) 130/44 (!) 137/53 (!) 137/53 (!) 136/45    Filed Weights   11/13/23 0429 11/14/23 0500 11/15/23 0459  Weight: 64.2 kg 64 kg 64 kg    10.  HLD: Crestor  transition to Lipitor  80 mg   11.  AKI: Baseline creatinine 1.2-1.5. Required IV fluids.  Currently 1.51    - 9-25: Slow improvement, creatinine 1.4.  IV fluids today as above.  - 9/29: P.o. intakes remain poor, BUN/creatinine up slightly, advised patient to increase p.o. intakes.  Repeat in AM.  9-30: BUN/creatinine improved.  12.  COPD:  continue Breztri , DuoNeb as needed             - Incentive spirometer   13. Constipation: last BM 9/22, add senna HS  - 9/25: add daily miralax    - 9/26: Give sorbitol  today -11/11/23 multiple BMs with sorbitol  (would be cautious in the future), cont regimen but monitor if still having BMs too often after clean out. -9/30 LBM small  14.  Anemia.  Add iron  studies, FOBT today.  Add Protonix  40 mg twice daily AC for GI protection.  Repeat H&H in AM.  - Overdue for EPO, which she gets as outpatient, administered 9-29  - On supplementation with iron , vitamin C,  B12, and folic acid  - 0-69: Hemoglobin stable, 8.1.  FOBT pending.  Iron  studies significant for anemia chronic disease  - 10-1: FOBT negative.  Hemoglobin remained stable, repeat labs tomorrow a.m.   LOS: 7 days A FACE TO FACE EVALUATION WAS PERFORMED  Joesph JAYSON Likes 11/15/2023, 7:47 AM

## 2023-11-16 LAB — CBC
HCT: 28 % — ABNORMAL LOW (ref 36.0–46.0)
Hemoglobin: 8.3 g/dL — ABNORMAL LOW (ref 12.0–15.0)
MCH: 28.9 pg (ref 26.0–34.0)
MCHC: 29.6 g/dL — ABNORMAL LOW (ref 30.0–36.0)
MCV: 97.6 fL (ref 80.0–100.0)
Platelets: 229 K/uL (ref 150–400)
RBC: 2.87 MIL/uL — ABNORMAL LOW (ref 3.87–5.11)
RDW: 14.4 % (ref 11.5–15.5)
WBC: 7.7 K/uL (ref 4.0–10.5)
nRBC: 0 % (ref 0.0–0.2)

## 2023-11-16 LAB — BASIC METABOLIC PANEL WITH GFR
Anion gap: 7 (ref 5–15)
BUN: 26 mg/dL — ABNORMAL HIGH (ref 8–23)
CO2: 28 mmol/L (ref 22–32)
Calcium: 8.8 mg/dL — ABNORMAL LOW (ref 8.9–10.3)
Chloride: 107 mmol/L (ref 98–111)
Creatinine, Ser: 1.55 mg/dL — ABNORMAL HIGH (ref 0.44–1.00)
GFR, Estimated: 32 mL/min — ABNORMAL LOW (ref >60)
Glucose, Bld: 78 mg/dL (ref 70–99)
Potassium: 4.3 mmol/L (ref 3.5–5.1)
Sodium: 142 mmol/L (ref 135–145)

## 2023-11-16 MED ORDER — DICLOFENAC SODIUM 1 % EX GEL
4.0000 g | Freq: Four times a day (QID) | CUTANEOUS | Status: DC
  Administered 2023-11-16 – 2023-11-21 (×17): 4 g via TOPICAL
  Filled 2023-11-16: qty 100

## 2023-11-16 MED ORDER — POLYETHYLENE GLYCOL 3350 17 G PO PACK
17.0000 g | PACK | Freq: Two times a day (BID) | ORAL | Status: DC
  Administered 2023-11-16 – 2023-11-23 (×11): 17 g via ORAL
  Filled 2023-11-16 (×14): qty 1

## 2023-11-16 MED ORDER — SENNA 8.6 MG PO TABS
2.0000 | ORAL_TABLET | Freq: Every day | ORAL | Status: DC
  Administered 2023-11-16 – 2023-11-21 (×5): 17.2 mg via ORAL
  Filled 2023-11-16 (×7): qty 2

## 2023-11-16 MED ORDER — SODIUM CHLORIDE 0.9 % IV SOLN: INTRAVENOUS | Status: DC

## 2023-11-16 NOTE — Plan of Care (Signed)
 LTGs updated given progress thus far and d/c date  Problem: RH Expression Communication Goal: LTG Patient will increase word finding of common (SLP) Description: LTG:  Patient will increase word finding of common objects/daily info/abstract thoughts with cues using compensatory strategies (SLP). Flowsheets (Taken 11/16/2023 0749) LTG: Patient will increase word finding of common (SLP): Supervision   Problem: RH Problem Solving Goal: LTG Patient will demonstrate problem solving for (SLP) Description: LTG:  Patient will demonstrate problem solving for basic/complex daily situations with cues  (SLP) Flowsheets (Taken 11/16/2023 0749) LTG: Patient will demonstrate problem solving for (SLP):  Basic daily situations  Complex daily situations LTG Patient will demonstrate problem solving for: Supervision

## 2023-11-16 NOTE — Progress Notes (Signed)
 PROGRESS NOTE   Subjective/Complaints:  No events overnight.   BP initially recorded as 80s over 50s this a.m., however noted patient has vascular issue on left upper extremity and was normotensive on right upper extremity. Cr elevated 1.55 today; patient endorses she is drinking about 2 cups of water a day, but is willing to try to drink more.  Other labs are stable  Patient's son and therapies note over the last 2 days, increasingly tired, withdrawn.  Patient complaining of pain around her knees, and generalized pain with movement.  Appetite has been improving over the last day or so  ROS: Denies fevers, chills, N/V, abdominal pain, constipation, diarrhea, cough, chest pain, new weakness or paraesthesias.   Poor appetite Shortness of breath Generalized pain/discomfort Fatigue Knee pain Objective:   No results found. Recent Labs    11/14/23 0531 11/16/23 0535  WBC  --  7.7  HGB 8.1* 8.3*  HCT 26.7* 28.0*  PLT  --  229    Recent Labs    11/14/23 0531 11/16/23 0535  NA 142 142  K 4.0 4.3  CL 108 107  CO2 28 28  GLUCOSE 82 78  BUN 21 26*  CREATININE 1.29* 1.55*  CALCIUM  8.6* 8.8*     Intake/Output Summary (Last 24 hours) at 11/16/2023 0935 Last data filed at 11/15/2023 1857 Gross per 24 hour  Intake 150 ml  Output --  Net 150 ml        Physical Exam: Vital Signs Blood pressure (!) 85/50, pulse 65, temperature 97.7 F (36.5 C), temperature source Oral, resp. rate 17, height 5' 2 (1.575 m), weight 62.9 kg, SpO2 100%.  Constitutional: No apparent distress. Appropriate appearance for age.  Sitting upright in bedside chair. HENT: No JVD. Neck Supple. Trachea midline. Atraumatic, normocephalic. Eyes: PERRLA. EOMI. Visual fields grossly intact.  Cardiovascular: RRR, no murmurs/rub/gallops.  1+ bilateral lower extremity edema.--Unchanged +TEDS Respiratory: Global mostly diminished, clear to auscultation  bilaterally,, no rales, rhonchi, or wheezing.  On 2 L nasal cannula--stable Abdomen: + bowel sounds, normoactive. No distention or tenderness. Soft.  Skin: skin tear RLE covered with tegaderm--inferior bruising extending.  MSK: No apparent deformity.   + TTP bilateral knees along superior, lateral, and inferior margins of the patella.  Not extending to the posterior aspect.  No limitations in range of motion.  Neurologic exam:  Cognition: AAO to person, place, partially to time as month and year + Mild memory deficits, otherwise cognition intact.  Within normal limits for age. Insight: Good  insight into current condition.  Mood: Pleasant affect, appropriate mood.  Sensation: Equal and intact in BL UE and Les.  Reflexes:  Negative Hoffman's and babinski signs bilaterally.  CN: 2-12 grossly intact.  Coordination: No apparent tremors. No ataxia on FTN, HTS bilaterally.  Spasticity: MAS 0 in all extremities.    Assessment/Plan: 1. Functional deficits which require 3+ hours per day of interdisciplinary therapy in a comprehensive inpatient rehab setting. Physiatrist is providing close team supervision and 24 hour management of active medical problems listed below. Physiatrist and rehab team continue to assess barriers to discharge/monitor patient progress toward functional and medical goals  Care Tool:  Bathing  Bathing activity did not occur: Safety/medical concerns (Limited eval due to elevated/variable HR (67-135 bpm).)           Bathing assist       Upper Body Dressing/Undressing Upper body dressing Upper body dressing/undressing activity did not occur (including orthotics): Safety/medical concerns (Limited eval due to elevated/variable HR (67-135 bpm).)      Upper body assist      Lower Body Dressing/Undressing Lower body dressing    Lower body dressing activity did not occur: Safety/medical concerns (Limited eval due to elevated/variable HR (67-135 bpm).)       Lower  body assist       Toileting Toileting    Toileting assist Assist for toileting: 2 Helpers     Transfers Chair/bed transfer  Transfers assist  Chair/bed transfer activity did not occur: Safety/medical concerns        Locomotion Ambulation   Ambulation assist   Ambulation activity did not occur: Safety/medical concerns          Walk 10 feet activity   Assist  Walk 10 feet activity did not occur: Safety/medical concerns        Walk 50 feet activity   Assist Walk 50 feet with 2 turns activity did not occur: Safety/medical concerns         Walk 150 feet activity   Assist Walk 150 feet activity did not occur: Safety/medical concerns         Walk 10 feet on uneven surface  activity   Assist Walk 10 feet on uneven surfaces activity did not occur: Safety/medical concerns         Wheelchair     Assist Is the patient using a wheelchair?: Yes Type of Wheelchair: Manual Wheelchair activity did not occur: Safety/medical concerns         Wheelchair 50 feet with 2 turns activity    Assist    Wheelchair 50 feet with 2 turns activity did not occur: Safety/medical concerns       Wheelchair 150 feet activity     Assist  Wheelchair 150 feet activity did not occur: Safety/medical concerns       Blood pressure (!) 85/50, pulse 65, temperature 97.7 F (36.5 C), temperature source Oral, resp. rate 17, height 5' 2 (1.575 m), weight 62.9 kg, SpO2 100%.  Medical Problem List and Plan: 1. Functional deficits secondary to left thalamic capsular infarct             -patient may shower             -ELOS/Goals: 10-14 days S - 10/9             -Stable to continue CIR   - 9/30: Lives with husband who cannot provide assist; supervision goals currently, setup for UBD and Mod-Max LBD. Activity tolerance remains poor as well as balance. Min A goals and CGA for transfers goals. Mod I goals SLP. Per son not far off her bsaeline for cog. Dys 1 diet  downgrade  -  apparently blended all her foods at home.    2.  Antithrombotics: -DVT/anticoagulation:  Mechanical: Sequential compression devices, below knee Bilateral lower extremities Pharmaceutical: Eliquis              -antiplatelet therapy: Eliquis  2.5 mg twice daily--tolerating  3. Pain Management: Tylenol  as needed   - 9/30: Start gabapentin 100 mg TID for LE neuropathy, pain  10-1: Ongoing severe back, head pain overnight.  Scheduled Tylenol  1000 mg 3 times daily.  10-2: DC gabapentin due to cognitive changes.  Add Voltaren gel to bilateral knees  4. Mood/Behavior/Sleep: LCSW to follow for evaluation and support when available.              -antipsychotic agents: [name]             - Continue melatonin 3 mg as needed at bedtime, also has trazodone  PRN 5. Neuropsych/cognition: This patient is capable of making decisions on her own behalf. 6. Skin/Wound Care: Routine pressure relief measures   - 9/30: R anterior skin tear from fall ; eucerin daily for dry skin  7. Fluids/Electrolytes/Nutrition: Monitor I&O Daily weights with follow-up labs. Continue ensure. Continue vitamins/supplements.              - SLP following; on DYS 3-- now DYS 2 - 9/26: Poor p.o. intakes for several months per family due to lack of appetite since finishing an antibiotic.  Nutrition consulted for recommendations, will start mirtazapine  7.5 mg nightly for appetite. - 9/29: P.o. intakes remain poor.  Start Megace 200 mg twice daily. 9-30: Downgraded to dysphagia 1 diet due to blending foods at home.  Monitor intakes on Megace.--Somewhat improved 10-1 10/2: Patient encouraged to drink at least 6 cups of water per day.  P.o. intakes are some weight better since starting Megace.  Repeat BMP in a.m.; may need to start IV fluids if no improvements     8.  A-fib: History of noncompliance with Eliquis .              - Resumed Eliquis              - continue Metoprolol  25 mg nightly for rate control, currently stable -  9/25: A-fib with RVR into the 150s with mobilization this a.m.  Got additional dose of metoprolol  this a.m. for coverage.  Cardiology consulted, appreciate recommendations 9-26: Cardiology switched from long-acting metoprolol  to Lopressor  25 mg twice daily; patient is tolerating this much better.  DC IV fluids. -11/11/23 rate controlled, doing well on this regimen 10-1: Some rate variations overnight, staying between 55 and 101.  Overall stay within parameters.   9.  HTN/HFpEF: continue hydralazine  25 mg twice daily and metoprolol              - Monitor blood pressures with increased activity - 9-25: Hypotensive into the 80s with mobilization; hold hydralazine , continue metoprolol  for rate control, will give 1 L IV fluid today.  Cardiology consult pending. - 9/26: Blood pressure much better today, 140s over 50s.  Daily weight stable. DC IV fluids, monitor.    -9/27-28/25 BPs improving, wt stable, monitor.   10-1: Blood pressure stable 120s to 130s, 50s diastolic.  Continue current regimen.  Monitor.  10-2: Some erroneous blood pressure readings this morning due to being done on left limb; Place limb restriction, normotensive on right limb  Vitals:   11/13/23 2006 11/14/23 0510 11/14/23 1440 11/14/23 2008  BP: (!) 132/38 (!) 132/49 (!) 130/44 (!) 137/53   11/14/23 2053 11/15/23 0444 11/15/23 1540 11/15/23 2039  BP: (!) 137/53 (!) 136/45 (!) 119/53 (!) 116/50   11/15/23 2040 11/16/23 0346 11/16/23 0756 11/16/23 0800  BP: (!) 116/50 (!) 143/54 (!) 88/45 (!) 85/50    Filed Weights   11/14/23 0500 11/15/23 0459 11/16/23 0515  Weight: 64 kg 64 kg 62.9 kg    10.  HLD: Crestor  transition to Lipitor  80 mg   11.  AKI: Baseline creatinine 1.2-1.5. Required IV fluids.  Currently 1.51    -  9-25: Slow improvement, creatinine 1.4.  IV fluids today as above.  - 9/29: P.o. intakes remain poor, BUN/creatinine up slightly, advised patient to increase p.o. intakes.  Repeat in AM.  9-30: BUN/creatinine  improved.  10/2: Creatinine up slightly today.  Patient to work on increased p.o. intakes.  Repeat BMP in AM.  12.  COPD:  continue Breztri , DuoNeb as needed             - Incentive spirometer   13. Constipation: last BM 9/22, add senna HS  - 9/25: add daily miralax    - 9/26: Give sorbitol  today -11/11/23 multiple BMs with sorbitol  (would be cautious in the future), cont regimen but monitor if still having BMs too often after clean out. -9/30 LBM small--Will increase MiraLAX  to twice daily and Senokot to 2 tablets nightly  14.  Anemia.  Add iron  studies, FOBT today.  Add Protonix  40 mg twice daily AC for GI protection.  Repeat H&H in AM.  - Overdue for EPO, which she gets as outpatient, administered 9-29  - On supplementation with iron , vitamin C, B12, and folic acid  - 0-69: Hemoglobin stable, 8.1.  FOBT pending.  Iron  studies significant for anemia chronic disease  - 10-1: FOBT negative.  Hemoglobin remained stable, repeat labs tomorrow a.m.  10/2: stable   LOS: 8 days A FACE TO FACE EVALUATION WAS PERFORMED  Joesph JAYSON Likes 11/16/2023, 9:35 AM

## 2023-11-16 NOTE — Progress Notes (Signed)
 Patient endorsing right and left leg pain. Pain in the right and left legs medial side. Pain staying within the tibial area of both legs. Legs are hot to the touch and pitting edema to the right leg. Pain is worse upon touch to the left leg. Reports pain is worse at night and when lying down.

## 2023-11-16 NOTE — Progress Notes (Signed)
 Speech Language Pathology Weekly Progress and Session Note  Patient Details  Name: Courtney Cline MRN: [id number] Date of Birth: May 16, 1933  Beginning of progress report period: November 09, 2023 End of progress report period: November 16, 2023  Today's Date: 11/16/2023 SLP Individual Time: 9199-9154 SLP Individual Time Calculation (min): 45 min  Short Term Goals: Week 1: SLP Short Term Goal 1 (Week 1): Pt will solve mildly complex problems w/ supervisionA SLP Short Term Goal 1 - Progress (Week 1): Not met SLP Short Term Goal 2 (Week 1): Pt will complete mildly specific word finding tasks w/ supervisionA SLP Short Term Goal 2 - Progress (Week 1): Not met SLP Short Term Goal 3 (Week 1): Pt will tolerate the lest restrictive diet w/ supervisionA SLP Short Term Goal 3 - Progress (Week 1): Met SLP Short Term Goal 4 (Week 1): Pt willl recall recent/relevant info w/ supervisionA SLP Short Term Goal 4 - Progress (Week 1): Not met    New Short Term Goals: Week 2: SLP Short Term Goal 1 (Week 2): STGs = LTGs d/t ELOS  Weekly Progress Updates: Slight progress noted towards overall progress this week. Reduced endurance (cognitive and general) has resulted in slow progress towards ST goals. She demonstrates emerging success w/ recall of information as well as word finding and problem solving tasks of mild complexity, however, success continues to vary between supervision and minA. Mild cognitive-linguistic deficits remain overall. LTGs downgraded given progress thus far and d/c date. She is on track to meet updated LTGs by d/c and would benefit from continued ST to maximize pt independence and reduce caregiver burden.    Intensity: Minumum of 1-2 x/day, 30 to 90 minutes Frequency: 3 to 5 out of 7 days Duration/Length of Stay: 10/9 Treatment/Interventions: Cognitive remediation/compensation;Functional tasks;Cueing hierarchy;Patient/family education;Speech/Language  facilitation;Dysphagia/aspiration precaution training;Therapeutic Activities   Daily Session  Skilled Therapeutic Interventions:  Pt and her son greeted at bedside. She reported somewhat of a rough night/morning d/t leg pain (see below). She did present w/ reduced STM and orientation to date in initial conversation. SLP then facilitated decoding task. She benefited from min-modA cues for working memory, attention, and organization. This was also reduced success as compared to prev tx sessions. Tx team notified of increased difficulties this date. Her speech remained and thought formulation/word finding remained clear throughout. After ~25 mins, she was able to recall the date and 2 facts re this SLP. She was left to rest in bed w/ the alarm set and call light within reach.       Pain  8/10 bilateral leg pain. Nursing provided pain medication during tx session. See EMR for more information.   Therapy/Group: Individual Therapy  [name] 11/16/2023, 7:42 AM

## 2023-11-16 NOTE — Plan of Care (Signed)
  Problem: Consults Goal: RH STROKE PATIENT EDUCATION Description: See Patient Education module for education specifics  Outcome: Progressing   Problem: RH BOWEL ELIMINATION Goal: RH STG MANAGE BOWEL WITH ASSISTANCE Description: STG Manage Bowel with supervision-minimal Assistance. Outcome: Progressing   Problem: RH BLADDER ELIMINATION Goal: RH STG MANAGE BLADDER WITH ASSISTANCE Description: STG Manage Bladder With min- supervision Assistance Outcome: Progressing   Problem: RH SKIN INTEGRITY Goal: RH STG SKIN FREE OF INFECTION/BREAKDOWN Description: Manage skin free of infection/breakdown with min- supervision assistance  Outcome: Progressing   Problem: RH SAFETY Goal: RH STG ADHERE TO SAFETY PRECAUTIONS W/ASSISTANCE/DEVICE Description: STG Adhere to Safety Precautions With min- supervision Assistance/Device. Outcome: Progressing   Problem: RH PAIN MANAGEMENT Goal: RH STG PAIN MANAGED AT OR BELOW PT'S PAIN GOAL Description: <4 w/ prns Outcome: Progressing   Problem: RH KNOWLEDGE DEFICIT Goal: RH STG INCREASE KNOWLEDGE OF HYPERTENSION Description: Manage increase knowledge of hypertension with min- supervision assistance from sons using educational materials provided Outcome: Progressing Goal: RH STG INCREASE KNOWLEDGE OF DYSPHAGIA/FLUID INTAKE Description: Manage increase knowledge of dysphagia/fluid intake with min- supervision assistance from sons using educational materials provided Outcome: Progressing Goal: RH STG INCREASE KNOWLEGDE OF HYPERLIPIDEMIA Description: Manage increase knowledge of hyperlipidemia with min- supervision assistance from sons using educational materials provided Outcome: Progressing Goal: RH STG INCREASE KNOWLEDGE OF STROKE PROPHYLAXIS Description: Manage increase knowledge of stroke prophylaxis with min- supervision assistance from sons using educational materials provided Outcome: Progressing

## 2023-11-16 NOTE — Progress Notes (Addendum)
 Occupational Therapy Weekly Progress Note  Patient Details  Name: Courtney Cline MRN: [id number] Date of Birth: February 24, 1933  Beginning of progress report period: November 09, 2023 End of progress report period: November 16, 2023  Today's Date: 11/16/2023 OT Individual Time: 1005-1100 OT Individual Time Calculation (min): 55 min  and Today's Date: 11/16/2023 OT Missed Time: 15 Minutes Missed Time Reason: Patient fatigue   Patient has met 2 of 3 short term goals this reporting period. Pt demonstrates some fluctuation in performance as evidenced below. Pt currently requiring setup A for UB care and Mod-Max A for LB care due to deficits in functional reach and standing balance. Caregiver education to be scheduled closer to discharge.   Patient continues to demonstrate the following deficits: muscle weakness and muscle joint tightness, decreased cardiorespiratoy endurance, decreased coordination and decreased motor planning, decreased midline orientation and decreased attention to right, decreased awareness, decreased safety awareness, and decreased memory, and decreased standing balance, decreased postural control, and decreased balance strategies and therefore will continue to benefit from skilled OT intervention to enhance overall performance with BADL and Reduce care partner burden.  Patient progressing toward long term goals..  Continue plan of care.  OT Short Term Goals Week 1:  OT Short Term Goal 1 (Week 1): Pt will perform toilet transfers with Min A + LRAD. OT Short Term Goal 1 - Progress (Week 1): Met OT Short Term Goal 2 (Week 1): Pt will tolerate >2 mins of standing with Min A + LRAD, in preparation for standing ADLs. OT Short Term Goal 2 - Progress (Week 1): Met OT Short Term Goal 3 (Week 1): Pt will thread LB garments with Min A. OT Short Term Goal 3 - Progress (Week 1): Not met Week 2:  OT Short Term Goal 1 (Week 2): STGs=LTGs due to patient's estimated length of  stay.  Skilled Therapeutic Interventions/Progress Updates:   Pt greeted resting in bed, reports of BLE pain heightened with touch, pre-medicated. Pt presents with increased lethargy, closing eyes during conversation, reports . . . Not feeling well. . . However willing to participate in therapy. Pt transitions to EOB with Min A, demo' retropulsion with progressing to EOB. Stand-pivot from EOB>WC with Mod A, patient with premature sitting into WC. TEDs dependently donned for low BP management, increased BLE sensitivity with donning, but tolerable during remainder of session. Pt dependent for WC transport from room<>day room for energy management. Pt completes x2 bouts of ~15 ft of functional mobility for general conditioning, Min A provided with patient demo increased R lateral lean/drift into RW. Pt received/maintained on 4L O2 [place]. Pt remained sitting in WC with all immediate needs met.   Therapy Documentation Precautions:  Precautions Precautions: Fall Recall of Precautions/Restrictions: Impaired Precaution/Restrictions Comments: Watch HR and BP; 4L O2 [place] Restrictions Weight Bearing Restrictions Per Provider Order: No   Therapy/Group: Individual Therapy  [name], OTR/L, MSOT  11/16/2023, 8:01 AM

## 2023-11-16 NOTE — Progress Notes (Signed)
 Physical Therapy Weekly Progress Note  Patient Details  Name: Courtney Cline MRN: [id number] Date of Birth: 1933/12/22  Beginning of progress report period: November 09, 2023 End of progress report period: November 16, 2023  Today's Date: 11/16/2023 PT Individual Time: 1420-1535 PT Individual Time Calculation (min): 75 min   Patient has met 1 of 4 short term goals.  Pt limited yesterday and in AM sessions by fatigue, poor sequencing, and increased assist needed with all mobility however pt demonstrating improvement in today's session. Pt completing bed mobility with supervision, completes transfers with CGA/min assist, and ambulates up to 105' with RW with CGA/min assist and +2 WC follow for fatigue. Education has been ongoing with pt's youngest son, however pt would benefit from education with other son as he will likely be present during the day at DC.  Patient continues to demonstrate the following deficits muscle weakness, decreased cardiorespiratoy endurance and decreased oxygen  support, decreased coordination and decreased motor planning, decreased attention to right, decreased initiation, decreased attention, decreased awareness, decreased problem solving, decreased safety awareness, decreased memory, and delayed processing, and decreased standing balance, decreased postural control, hemiplegia, and decreased balance strategies and therefore will continue to benefit from skilled PT intervention to increase functional independence with mobility.  Patient progressing toward long term goals..  Continue plan of care.  PT Short Term Goals Week 1:  PT Short Term Goal 1 (Week 1): Pt will transfer sup to sit w/ min A consistently. PT Short Term Goal 1 - Progress (Week 1): Progressing toward goal PT Short Term Goal 2 (Week 1): Pt will transfer st to stand w/ min A consistently. PT Short Term Goal 2 - Progress (Week 1): Progressing toward goal PT Short Term Goal 3 (Week 1): PT to assess  gait PT Short Term Goal 3 - Progress (Week 1): Met PT Short Term Goal 4 (Week 1): Pt to assess stairs. PT Short Term Goal 4 - Progress (Week 1): Progressing toward goal Week 2:  PT Short Term Goal 1 (Week 2): STG = LTG due to ELOS  Skilled Therapeutic Interventions/Progress Updates:  Pt presents in room in [facility], agreeable to PT. Pt reporting increased pain in BLEs this session. Session focused on therapeutic activities for transfer training, activity tolerance and upright tolerance as well as gait training for stair negotiation. Pt vitals assessed at start of session, BP 119/44, HR 61, pt denies symptoms. Pt hooked up to portable O2 at 4L during session, transported from room via WC to day room. Pt ambulates 64' and 105' with RW, CGA/min assist +2 WC follow for fatigue with pt demonstrating significant improvement in BLE foot clearance, terminal knee/hip extension, and RW management this session compared to yesterday's session. Pt endurance and postural stability notably improved as well. Pt requires extended seated rest breaks between gait trials secondary to fatigue. Pt transported to main gym, completes up/down 4 steps with cues for sequencing, min assist for ascending leading with LLE, min/mod assist for descending leading with RLE. Pt requires extended seated rest break, then transported to ortho gym via WC. Pt completes stand step transfer WC to nustep with min assist, pt demonstrating R lateral lean and LOB to R. Pt completes continuous training on nustep BUE/BLE 10 minutes on L1, pt maintaining average 39 spm, total steps 474, pt stating frequently this is quite fun! Pt completes transfer back to [facility] with CGA, transported back to room, requires CGA/light min assist for stand step to R to return to bed, completes side stepping  to L with CGA. Pt completes sit to supine with min assist for BLE management and repositioning in supine. Pt remains semi reclined with all needs wtihin reach, call light in  place, hooked on wall O2, bed alarm activated at end of session.  Ambulation/gait training;Community reintegration;Neuromuscular re-education;Stair training;UE/LE Strength taining/ROM;Wheelchair propulsion/positioning;UE/LE Coordination activities;Therapeutic Activities;Discharge planning;Balance/vestibular training;Patient/family education;Functional mobility training;Therapeutic Exercise;Pain management;DME/adaptive equipment instruction;Disease management/prevention;Skin care/wound management   Therapy Documentation Precautions:  Precautions Precautions: Fall Recall of Precautions/Restrictions: Impaired Precaution/Restrictions Comments: Watch HR and BP; 4L O2 [place] Restrictions Weight Bearing Restrictions Per Provider Order: No    Therapy/Group: Individual Therapy  Reche Ohara PT, DPT 11/16/2023, 4:00 PM

## 2023-11-17 LAB — BASIC METABOLIC PANEL WITH GFR
Anion gap: 11 (ref 5–15)
BUN: 34 mg/dL — ABNORMAL HIGH (ref 8–23)
CO2: 23 mmol/L (ref 22–32)
Calcium: 8.4 mg/dL — ABNORMAL LOW (ref 8.9–10.3)
Chloride: 110 mmol/L (ref 98–111)
Creatinine, Ser: 1.72 mg/dL — ABNORMAL HIGH (ref 0.44–1.00)
GFR, Estimated: 28 mL/min — ABNORMAL LOW (ref >60)
Glucose, Bld: 95 mg/dL (ref 70–99)
Potassium: 4.7 mmol/L (ref 3.5–5.1)
Sodium: 144 mmol/L (ref 135–145)

## 2023-11-17 MED ORDER — OXYMETAZOLINE HCL 0.05 % NA SOLN
1.0000 | Freq: Two times a day (BID) | NASAL | Status: AC | PRN
  Administered 2023-11-18 – 2023-11-19 (×2): 1 via NASAL
  Filled 2023-11-17: qty 30

## 2023-11-17 MED ORDER — FUROSEMIDE 20 MG PO TABS
20.0000 mg | ORAL_TABLET | Freq: Every day | ORAL | Status: DC
Start: 2023-11-17 — End: 2023-11-18
  Administered 2023-11-17 – 2023-11-18 (×2): 20 mg via ORAL
  Filled 2023-11-17 (×2): qty 1

## 2023-11-17 NOTE — Plan of Care (Signed)
  Problem: Consults Goal: RH STROKE PATIENT EDUCATION Description: See Patient Education module for education specifics  Outcome: Progressing   Problem: RH BOWEL ELIMINATION Goal: RH STG MANAGE BOWEL WITH ASSISTANCE Description: STG Manage Bowel with supervision-minimal Assistance. Outcome: Progressing   Problem: RH BLADDER ELIMINATION Goal: RH STG MANAGE BLADDER WITH ASSISTANCE Description: STG Manage Bladder With min- supervision Assistance Outcome: Progressing   Problem: RH SKIN INTEGRITY Goal: RH STG SKIN FREE OF INFECTION/BREAKDOWN Description: Manage skin free of infection/breakdown with min- supervision assistance  Outcome: Progressing   Problem: RH SAFETY Goal: RH STG ADHERE TO SAFETY PRECAUTIONS W/ASSISTANCE/DEVICE Description: STG Adhere to Safety Precautions With min- supervision Assistance/Device. Outcome: Progressing   Problem: RH PAIN MANAGEMENT Goal: RH STG PAIN MANAGED AT OR BELOW PT'S PAIN GOAL Description: <4 w/ prns Outcome: Progressing   Problem: RH KNOWLEDGE DEFICIT Goal: RH STG INCREASE KNOWLEDGE OF HYPERTENSION Description: Manage increase knowledge of hypertension with min- supervision assistance from sons using educational materials provided Outcome: Progressing Goal: RH STG INCREASE KNOWLEDGE OF DYSPHAGIA/FLUID INTAKE Description: Manage increase knowledge of dysphagia/fluid intake with min- supervision assistance from sons using educational materials provided Outcome: Progressing Goal: RH STG INCREASE KNOWLEGDE OF HYPERLIPIDEMIA Description: Manage increase knowledge of hyperlipidemia with min- supervision assistance from sons using educational materials provided Outcome: Progressing Goal: RH STG INCREASE KNOWLEDGE OF STROKE PROPHYLAXIS Description: Manage increase knowledge of stroke prophylaxis with min- supervision assistance from sons using educational materials provided Outcome: Progressing

## 2023-11-17 NOTE — Progress Notes (Signed)
 Occupational Therapy Session Note  Patient Details  Name: Madi Bonfiglio MRN: [id number] Date of Birth: Apr 19, 1933  Today's Date: 11/17/2023 OT Individual Time: 1050-1115 OT Individual Time Calculation (min): 25 min    Short Term Goals: Week 2:  OT Short Term Goal 1 (Week 2): STGs=LTGs due to patient's estimated length of stay.  Skilled Therapeutic Interventions/Progress Updates:    Pt received sitting in std chair from OTR. LPN attending to pt's RLE. Skilled OT intervention with focus on LB dressing and functional transfers. Min A for LB dressing and donning shoes. Transfers with supervision using RW. Pt remained in w/c with seat alarm activated. All needs within reach.   Therapy Documentation Precautions:  Precautions Precautions: Fall Recall of Precautions/Restrictions: Impaired Precaution/Restrictions Comments: Watch HR and BP; 4L O2 [place] Restrictions Weight Bearing Restrictions Per Provider Order: No   Pain:  PT denies pain this morning.  Therapy/Group: Individual Therapy  Maritza Debby Mare 11/17/2023, 12:14 PM

## 2023-11-17 NOTE — Progress Notes (Addendum)
 PROGRESS NOTE   Subjective/Complaints:  No acute complaints.  No events overnight.  Patient states she is feeling much better overall today.  Knee pain has improved with use of Voltaren gel.  PT noted she had a nosebleed this morning, spontaneously resolved but did require quite some time.  Patient states her last BM was this morning  Appetite remains overall poor, but has been improving over the last 2 days or so  ROS: Denies fevers, chills, N/V, abdominal pain, constipation, diarrhea, cough, chest pain, new weakness or paraesthesias.   Poor appetite Knee pain Epistaxis  Objective:   No results found. Recent Labs    11/16/23 0535  WBC 7.7  HGB 8.3*  HCT 28.0*  PLT 229    Recent Labs    11/16/23 0535  NA 142  K 4.3  CL 107  CO2 28  GLUCOSE 78  BUN 26*  CREATININE 1.55*  CALCIUM  8.8*     Intake/Output Summary (Last 24 hours) at 11/17/2023 0944 Last data filed at 11/16/2023 1400 Gross per 24 hour  Intake 340 ml  Output --  Net 340 ml        Physical Exam: Vital Signs Blood pressure (!) 133/44, pulse 72, temperature 98.6 F (37 C), resp. rate 20, height 5' 2 (1.575 m), weight 68.3 kg, SpO2 98%.  Constitutional: No apparent distress. Appropriate appearance for age.  Sitting upright in bedside chair. HENT: No JVD. Neck Supple. Trachea midline. Atraumatic, normocephalic. Eyes: PERRLA. EOMI. Visual fields grossly intact.  Cardiovascular: RRR, no murmurs/rub/gallops.  2+ bilateral lower extremity edema. Respiratory: Global mostly diminished, clear to auscultation bilaterally,, no rales, rhonchi, or wheezing.  On 2 L nasal cannula--stable Abdomen: + bowel sounds, normoactive. No distention or tenderness. Soft.  Skin: skin tear RLE covered with tegaderm-continues to bleed-inferior bruising on ankle.  Stable from prior exams.  MSK: No apparent deformity.    + TTP bilateral knees along superior, lateral,  and inferior margins of the patella--much better tolerance of palpation 10-3  Neurologic exam:  Cognition: AAO to person, place, and time with some cueing + Mild memory deficits, otherwise cognition intact.  Within normal limits for age. Insight: Good  insight into current condition.  Mood: Pleasant affect, appropriate mood.  Sensation: Equal and intact in BL UE and Les.  Reflexes:  Negative Hoffman's and babinski signs bilaterally.  CN: 2-12 grossly intact.  Coordination: No apparent tremors. No ataxia on FTN, HTS bilaterally.  Spasticity: MAS 0 in all extremities.    Assessment/Plan: 1. Functional deficits which require 3+ hours per day of interdisciplinary therapy in a comprehensive inpatient rehab setting. Physiatrist is providing close team supervision and 24 hour management of active medical problems listed below. Physiatrist and rehab team continue to assess barriers to discharge/monitor patient progress toward functional and medical goals  Care Tool:  Bathing  Bathing activity did not occur: Safety/medical concerns (Limited eval due to elevated/variable HR (67-135 bpm).)           Bathing assist       Upper Body Dressing/Undressing Upper body dressing Upper body dressing/undressing activity did not occur (including orthotics): Safety/medical concerns (Limited eval due to elevated/variable HR (67-135 bpm).)  Upper body assist      Lower Body Dressing/Undressing Lower body dressing    Lower body dressing activity did not occur: Safety/medical concerns (Limited eval due to elevated/variable HR (67-135 bpm).)       Lower body assist       Toileting Toileting    Toileting assist Assist for toileting: 2 Helpers     Transfers Chair/bed transfer  Transfers assist  Chair/bed transfer activity did not occur: Safety/medical concerns        Locomotion Ambulation   Ambulation assist   Ambulation activity did not occur: Safety/medical concerns           Walk 10 feet activity   Assist  Walk 10 feet activity did not occur: Safety/medical concerns        Walk 50 feet activity   Assist Walk 50 feet with 2 turns activity did not occur: Safety/medical concerns         Walk 150 feet activity   Assist Walk 150 feet activity did not occur: Safety/medical concerns         Walk 10 feet on uneven surface  activity   Assist Walk 10 feet on uneven surfaces activity did not occur: Safety/medical concerns         Wheelchair     Assist Is the patient using a wheelchair?: Yes Type of Wheelchair: Manual Wheelchair activity did not occur: Safety/medical concerns         Wheelchair 50 feet with 2 turns activity    Assist    Wheelchair 50 feet with 2 turns activity did not occur: Safety/medical concerns       Wheelchair 150 feet activity     Assist  Wheelchair 150 feet activity did not occur: Safety/medical concerns       Blood pressure (!) 133/44, pulse 72, temperature 98.6 F (37 C), resp. rate 20, height 5' 2 (1.575 m), weight 68.3 kg, SpO2 98%.  Medical Problem List and Plan: 1. Functional deficits secondary to left thalamic capsular infarct             -patient may shower             -ELOS/Goals: 10-14 days S - 10/9             -Stable to continue CIR   - 9/30: Lives with husband who cannot provide assist; supervision goals currently, setup for UBD and Mod-Max LBD. Activity tolerance remains poor as well as balance. Min A goals and CGA for transfers goals. Mod I goals SLP. Per son not far off her bsaeline for cog. Dys 1 diet downgrade  -  apparently blended all her foods at home.    2.  Antithrombotics: -DVT/anticoagulation:  Mechanical: Sequential compression devices, below knee Bilateral lower extremities Pharmaceutical: Eliquis              -antiplatelet therapy: Eliquis  2.5 mg twice daily--tolerating  3. Pain Management: Tylenol  as needed   - 9/30: Start gabapentin 100 mg TID for LE  neuropathy, pain  10-1: Ongoing severe back, head pain overnight.  Scheduled Tylenol  1000 mg 3 times daily.  10-2: DC gabapentin due to cognitive changes.  Add Voltaren gel to bilateral knees  4. Mood/Behavior/Sleep: LCSW to follow for evaluation and support when available.              -antipsychotic agents: [name]             - Continue melatonin 3 mg as needed at bedtime, also has trazodone   PRN 5. Neuropsych/cognition: This patient is capable of making decisions on her own behalf. 6. Skin/Wound Care: Routine pressure relief measures   - 9/30: R anterior skin tear from fall ; eucerin daily for dry skin  7. Fluids/Electrolytes/Nutrition: Monitor I&O Daily weights with follow-up labs. Continue ensure. Continue vitamins/supplements.              - SLP following; on DYS 3-- now DYS 2 - 9/26: Poor p.o. intakes for several months per family due to lack of appetite since finishing an antibiotic.  Nutrition consulted for recommendations, will start mirtazapine  7.5 mg nightly for appetite. - 9/29: P.o. intakes remain poor.  Start Megace 200 mg twice daily. 9-30: Downgraded to dysphagia 1 diet due to blending foods at home.  Monitor intakes on Megace.--Somewhat improved 10-1 10/2: Patient encouraged to drink at least 6 cups of water per day.  P.o. intakes are some weight better since starting Megace.  Repeat BMP in a.m.; may need to start IV fluids if no improvements     8.  A-fib: History of noncompliance with Eliquis .              - Resumed Eliquis              - continue Metoprolol  25 mg nightly for rate control, currently stable - 9/25: A-fib with RVR into the 150s with mobilization this a.m.  Got additional dose of metoprolol  this a.m. for coverage.  Cardiology consulted, appreciate recommendations 9-26: Cardiology switched from long-acting metoprolol  to Lopressor  25 mg twice daily; patient is tolerating this much better.  DC IV fluids. -11/11/23 rate controlled, doing well on this regimen 10-1:  Some rate variations overnight, staying between 55 and 101.  Overall stay within parameters. 10-3: Heart rate staying within normal parameters.    11/17/2023    7:58 AM 11/17/2023    4:24 AM 11/16/2023    8:22 PM  Vitals with BMI  Weight  150 lbs 9 oz   BMI  27.53   Systolic 133 126 870  Diastolic 44 43 42  Pulse 72 69 79      9.  HTN/HFpEF: continue hydralazine  25 mg twice daily and metoprolol              - Monitor blood pressures with increased activity - 9-25: Hypotensive into the 80s with mobilization; hold hydralazine , continue metoprolol  for rate control, will give 1 L IV fluid today.  Cardiology consult pending. - 9/26: Blood pressure much better today, 140s over 50s.  Daily weight stable. DC IV fluids, monitor.    -9/27-28/25 BPs improving, wt stable, monitor.   10-1: Blood pressure stable 120s to 130s, 50s diastolic.  Continue current regimen.  Monitor.  10-2: Some erroneous blood pressure readings this morning due to being done on left limb (prior known vascular issue per family); Place limb restriction, normotensive on right arm.   10-3: Weights uptrending with PO, however some increased edema in the lower extremities.  Will add Lasix  20 mg daily for gentle diuresis.  Notable, was on Lasix  20 mg twice daily prior to admission; was DC'd initially due to AKI and hypovolemia.   Filed Weights   11/15/23 0459 11/16/23 0515 11/17/23 0424  Weight: 64 kg 62.9 kg 68.3 kg    10.  HLD: Crestor  transition to Lipitor  80 mg   11.  AKI: Baseline creatinine 1.2-1.5. Required IV fluids.  Currently 1.51    - 9-25: Slow improvement, creatinine 1.4.  IV fluids today as above.  -  9/29: P.o. intakes remain poor, BUN/creatinine up slightly, advised patient to increase p.o. intakes.  Repeat in AM.  9-30: BUN/creatinine improved.  10/2: Creatinine up slightly today.  Patient to work on increased p.o. intakes.  Repeat BMP in AM.  10-3: BMP pending today.  Weights and peripheral edema increases  above, starting Lasix  20 mg daily.  12.  COPD:  continue Breztri , DuoNeb as needed             - Incentive spirometer   13. Constipation: last BM 9/22, add senna HS  - 9/25: add daily miralax    - 9/26: Give sorbitol  today -11/11/23 multiple BMs with sorbitol  (would be cautious in the future), cont regimen but monitor if still having BMs too often after clean out. -9/30 LBM small--Will increase MiraLAX  to twice daily and Senokot to 2 tablets nightly 10-3: Last BM this a.m. per patient  14.  Anemia of chronic disease/nosebleeds.  Add iron  studies, FOBT today.  Add Protonix  40 mg twice daily AC for GI protection.  Repeat H&H in AM.  - Overdue for EPO, which she gets as outpatient, administered 9-29  - On supplementation with iron , vitamin C, B12, and folic acid  - 0-69: Hemoglobin stable, 8.1.  FOBT pending.  Iron  studies significant for anemia chronic disease  - 10-1: FOBT negative.  Hemoglobin remained stable, repeat labs tomorrow a.m.  10/2: stable  10-3: Prolonged nosebleed this a.m., self resolved.  Already on saline nose sprays 3 times daily, will add Afrin as needed.   LOS: 9 days A FACE TO FACE EVALUATION WAS PERFORMED  Joesph JAYSON Likes 11/17/2023, 9:44 AM

## 2023-11-17 NOTE — Progress Notes (Signed)
 Physical Therapy Session Note  Patient Details  Name: Syana Degraffenreid MRN: [id number] Date of Birth: Jan 23, 1934  Today's Date: 11/17/2023 PT Individual Time: 0800-0900 and 1300-1415 PT Individual Time Calculation (min): 60 min and 75 min  Short Term Goals: Week 2:  PT Short Term Goal 1 (Week 2): STG = LTG due to ELOS  Skilled Therapeutic Interventions/Progress Updates:   First session:  Pt presents supine in bed w/ nursing present for meds.  Pt states only pain is w/ RLE ant/lateral wound from fall.  Pt states 1/10 only.  Pt transfers sup to sit w/ CGA and scoots to EOB.  Pt dons shoes w/ set-up.  Pt having nose bleeds requiring breaks to attend to.  Pt sat EOB for all meds.  Pt transfers sit to stand w/ min A and then steps to w/c w/ RW and min A.  Pt wheeled to dayroom for time conservation.  Pt performed sit to stand at [facility] for toe taps to cone, knocking over cone w/ RLE.   4 platform retrieved and performed toe taps x 6 before nose bleed required stand to sit for containment.  Pt returned to room and nursing notified.  Pt performed LE there ex to increase strength.  Pt performed LAQ, marching, isometric add w/ pillow and calf raises 3 x 10.  Pt remained sitting in w/c w/ chair alarm on and all needs in reach.  Second session:  Pt presents sitting in w/c and agreeable to therapy.  Pt wheeled outside to [facility] for change in scenery.  Pt transfers sit to stand w/ min A and amb short distances w/ RW and min A on outdoor surfaces.  Pt fatigues quickly.  Pt requires verbal cues for safe approach to seat and hand placement for controlled descent to seated surface - w/c and bench.  Pt performs multiple sit to stand from bench surface w/ cues for scooting forward and forward lean , but heavy min A.  Pt performed standing marching 3 x 10.  Pt requires extended seated rest breaks 2/2 fatigue.  Pt returned to main gym.  Pt transfers sit to stand w/ light min A and performs sidestepping w/ RW and min A for  walker management.  Pt returned to room and remained sitting w/ seat alarm on and all needs in reach.      Therapy Documentation Precautions:  Precautions Precautions: Fall Recall of Precautions/Restrictions: Impaired Precaution/Restrictions Comments: Watch HR and BP; 4L O2 [place] Restrictions Weight Bearing Restrictions Per Provider Order: No General:   Vital Signs: Therapy Vitals Pulse Rate: 72 BP: (!) 133/44 Patient Position (if appropriate): Lying Pain:1/10, no c/o pain in PM Pain Assessment Pain Scale: 0-10 Pain Score: 0-No pain       Therapy/Group: Individual Therapy  Elizjah Noblet P Amenah Tucci 11/17/2023, 9:01 AM

## 2023-11-17 NOTE — Plan of Care (Signed)
  Problem: Consults Goal: RH STROKE PATIENT EDUCATION Description: See Patient Education module for education specifics  Outcome: Progressing   Problem: RH BOWEL ELIMINATION Goal: RH STG MANAGE BOWEL WITH ASSISTANCE Description: STG Manage Bowel with supervision-minimal Assistance. Outcome: Progressing   Problem: RH BLADDER ELIMINATION Goal: RH STG MANAGE BLADDER WITH ASSISTANCE Description: STG Manage Bladder With min- supervision Assistance Outcome: Progressing   Problem: RH SKIN INTEGRITY Goal: RH STG SKIN FREE OF INFECTION/BREAKDOWN Description: Manage skin free of infection/breakdown with min- supervision assistance  Outcome: Progressing   Problem: RH SAFETY Goal: RH STG ADHERE TO SAFETY PRECAUTIONS W/ASSISTANCE/DEVICE Description: STG Adhere to Safety Precautions With min- supervision Assistance/Device. Outcome: Progressing   Problem: RH PAIN MANAGEMENT Goal: RH STG PAIN MANAGED AT OR BELOW PT'S PAIN GOAL Description: <4 w/ prns Outcome: Progressing   Problem: RH KNOWLEDGE DEFICIT Goal: RH STG INCREASE KNOWLEDGE OF HYPERTENSION Description: Manage increase knowledge of hypertension with min- supervision assistance from sons using educational materials provided Outcome: Progressing

## 2023-11-17 NOTE — Progress Notes (Signed)
 Occupational Therapy Session Note  Patient Details  Name: Courtney Cline MRN: [id number] Date of Birth: 06-Aug-1933  Today's Date: 11/17/2023 OT Individual Time: 8984-8954 OT Individual Time Calculation (min): 30 min    Short Term Goals: Week 2:  OT Short Term Goal 1 (Week 2): STGs=LTGs due to patient's estimated length of stay. Week 3:     Skilled Therapeutic Interventions/Progress Updates:    1:1 Pt received in the w/c. Engaged in self care retraining at sink level (pt's choice). Pt able to bathe with setup sit to stand. Pt able to maintain O2 sats on 3 liters throughout session without shortness of breath. Pt able to perform basic transfers with RW with contact guard with extra time. Continued to work on activity tolerance/ endurance. Pt handed off to following therapist.  Therapy Documentation Precautions:  Precautions Precautions: Fall Recall of Precautions/Restrictions: Impaired Precaution/Restrictions Comments: Watch HR and BP; 4L O2 [place] Restrictions Weight Bearing Restrictions Per Provider Order: No  Pain:  No c/o pain in session    Therapy/Group: Individual Therapy  Claudene Nest [facility] 11/17/2023, 12:56 PM

## 2023-11-18 DIAGNOSIS — R040 Epistaxis: Secondary | ICD-10-CM

## 2023-11-18 MED ORDER — FUROSEMIDE 20 MG PO TABS
10.0000 mg | ORAL_TABLET | Freq: Every day | ORAL | Status: DC
  Administered 2023-11-19 – 2023-11-20 (×2): 10 mg via ORAL
  Filled 2023-11-18 (×2): qty 1

## 2023-11-18 NOTE — Progress Notes (Addendum)
 PROGRESS NOTE   Subjective/Complaints:  Pt doing ok, better she says. Slept GREAT! Pain doing ok, mostly just the skin tear on her RLE that bothers her. LBM yesterday but had another one after eval. Urinating fine. No other complaints or concerns.   Later called to room, pt spit up blood but appears to have nosebleed. Afrin advised. Pt stable.   ROS: Denies fevers, chills, N/V, abdominal pain, constipation, diarrhea, cough, chest pain, new weakness or paraesthesias.   Poor appetite Knee pain Epistaxis  Objective:   No results found. Recent Labs    11/16/23 0535  WBC 7.7  HGB 8.3*  HCT 28.0*  PLT 229    Recent Labs    11/16/23 0535 11/17/23 1014  NA 142 144  K 4.3 4.7  CL 107 110  CO2 28 23  GLUCOSE 78 95  BUN 26* 34*  CREATININE 1.55* 1.72*  CALCIUM  8.8* 8.4*     Intake/Output Summary (Last 24 hours) at 11/18/2023 1118 Last data filed at 11/18/2023 0736 Gross per 24 hour  Intake 480 ml  Output --  Net 480 ml        Physical Exam: Vital Signs Blood pressure (!) 125/51, pulse 65, temperature 98.9 F (37.2 C), temperature source Oral, resp. rate 16, height 5' 2 (1.575 m), weight 68.3 kg, SpO2 95%.  Constitutional: No apparent distress. Appropriate appearance for age.  Sitting upright in bed. HENT: No JVD. Neck Supple. Trachea midline. Atraumatic, normocephalic. Eyes: PERRLA. EOMI. Visual fields grossly intact.  Cardiovascular: RRR, no murmurs/rub/gallops.  1+ bilateral lower extremity edema. Respiratory: Global mostly diminished, clear to auscultation bilaterally,, no rales, rhonchi, or wheezing.  On 2 L nasal cannula--stable Abdomen: + bowel sounds, normoactive. No distention or tenderness. Soft.  Skin: skin tear RLE covered with ACE wrap.   PRIOR EXAMS: MSK: No apparent deformity.    + TTP bilateral knees along superior, lateral, and inferior margins of the patella--much better tolerance of  palpation 10-3  Neurologic exam:  Cognition: AAO to person, place, and time with some cueing + Mild memory deficits, otherwise cognition intact.  Within normal limits for age. Insight: Good  insight into current condition.  Mood: Pleasant affect, appropriate mood.  Sensation: Equal and intact in BL UE and Les.  Reflexes:  Negative Hoffman's and babinski signs bilaterally.  CN: 2-12 grossly intact.  Coordination: No apparent tremors. No ataxia on FTN, HTS bilaterally.  Spasticity: MAS 0 in all extremities.    Assessment/Plan: 1. Functional deficits which require 3+ hours per day of interdisciplinary therapy in a comprehensive inpatient rehab setting. Physiatrist is providing close team supervision and 24 hour management of active medical problems listed below. Physiatrist and rehab team continue to assess barriers to discharge/monitor patient progress toward functional and medical goals  Care Tool:  Bathing  Bathing activity did not occur: Safety/medical concerns (Limited eval due to elevated/variable HR (67-135 bpm).)           Bathing assist       Upper Body Dressing/Undressing Upper body dressing Upper body dressing/undressing activity did not occur (including orthotics): Safety/medical concerns (Limited eval due to elevated/variable HR (67-135 bpm).)      Upper body  assist      Lower Body Dressing/Undressing Lower body dressing    Lower body dressing activity did not occur: Safety/medical concerns (Limited eval due to elevated/variable HR (67-135 bpm).) What is the patient wearing?: Pants     Lower body assist Assist for lower body dressing: Minimal Assistance - Patient > 75%     Toileting Toileting    Toileting assist Assist for toileting: 2 Helpers     Transfers Chair/bed transfer  Transfers assist  Chair/bed transfer activity did not occur: Safety/medical concerns  Chair/bed transfer assist level: Minimal Assistance - Patient > 75% (w/ RW.)      Locomotion Ambulation   Ambulation assist   Ambulation activity did not occur: Safety/medical concerns  Assist level: Minimal Assistance - Patient > 75% Assistive device: Walker-rolling Max distance: 20   Walk 10 feet activity   Assist  Walk 10 feet activity did not occur: Safety/medical concerns  Assist level: Minimal Assistance - Patient > 75% Assistive device: Walker-rolling   Walk 50 feet activity   Assist Walk 50 feet with 2 turns activity did not occur: Safety/medical concerns         Walk 150 feet activity   Assist Walk 150 feet activity did not occur: Safety/medical concerns         Walk 10 feet on uneven surface  activity   Assist Walk 10 feet on uneven surfaces activity did not occur: Safety/medical concerns   Assist level: Minimal Assistance - Patient > 75% Assistive device: Walker-rolling   Wheelchair     Assist Is the patient using a wheelchair?: Yes Type of Wheelchair: Manual Wheelchair activity did not occur: Safety/medical concerns         Wheelchair 50 feet with 2 turns activity    Assist    Wheelchair 50 feet with 2 turns activity did not occur: Safety/medical concerns       Wheelchair 150 feet activity     Assist  Wheelchair 150 feet activity did not occur: Safety/medical concerns       Blood pressure (!) 125/51, pulse 65, temperature 98.9 F (37.2 C), temperature source Oral, resp. rate 16, height 5' 2 (1.575 m), weight 68.3 kg, SpO2 95%.  Medical Problem List and Plan: 1. Functional deficits secondary to left thalamic capsular infarct             -patient may shower             -ELOS/Goals: 10-14 days S - 10/9             -Stable to continue CIR  - 9/30: Lives with husband who cannot provide assist; supervision goals currently, setup for UBD and Mod-Max LBD. Activity tolerance remains poor as well as balance. Min A goals and CGA for transfers goals. Mod I goals SLP. Per son not far off her bsaeline for  cog. Dys 1 diet downgrade  -  apparently blended all her foods at home.    2.  Antithrombotics: -DVT/anticoagulation:  Mechanical: Sequential compression devices, below knee Bilateral lower extremities Pharmaceutical: Eliquis              -antiplatelet therapy: Eliquis  2.5 mg twice daily--tolerating  3. Pain Management: Tylenol  as needed   - 9/30: Start gabapentin 100 mg TID for LE neuropathy, pain  10-1: Ongoing severe back, head pain overnight.  Scheduled Tylenol  1000 mg 3 times daily.  10-2: DC gabapentin due to cognitive changes.  Add Voltaren gel to bilateral knees  4. Mood/Behavior/Sleep: LCSW to follow for  evaluation and support when available.              -antipsychotic agents: [name]             - Continue melatonin 3 mg as needed at bedtime, also has trazodone  PRN 5. Neuropsych/cognition: This patient is capable of making decisions on her own behalf. 6. Skin/Wound Care: Routine pressure relief measures   - 9/30: R anterior skin tear from fall ; eucerin daily for dry skin  7. Fluids/Electrolytes/Nutrition: Monitor I&O Daily weights with follow-up labs. Continue ensure. Continue vitamins/supplements.              - SLP following; on DYS 3-- now DYS 2 - 9/26: Poor p.o. intakes for several months per family due to lack of appetite since finishing an antibiotic.  Nutrition consulted for recommendations, will start mirtazapine  7.5 mg nightly for appetite. - 9/29: P.o. intakes remain poor.  Start Megace 200 mg twice daily. 9-30: Downgraded to dysphagia 1 diet due to blending foods at home.  Monitor intakes on Megace.--Somewhat improved 10-1 10/2: Patient encouraged to drink at least 6 cups of water per day.  P.o. intakes are some weight better since starting Megace.  Repeat BMP in a.m.; may need to start IV fluids if no improvements     8.  A-fib: History of noncompliance with Eliquis .              - Resumed Eliquis              - continue Metoprolol  25 mg nightly for rate control,  currently stable - 9/25: A-fib with RVR into the 150s with mobilization this a.m.  Got additional dose of metoprolol  this a.m. for coverage.  Cardiology consulted, appreciate recommendations 9-26: Cardiology switched from long-acting metoprolol  to Lopressor  25 mg twice daily; patient is tolerating this much better.  DC IV fluids. -11/11/23 rate controlled, doing well on this regimen 10-1: Some rate variations overnight, staying between 55 and 101.  Overall stay within parameters. 10-3: Heart rate staying within normal parameters.    11/18/2023    5:17 AM 11/17/2023    8:59 PM 11/17/2023    7:44 PM  Vitals with BMI  Systolic 125  133  Diastolic 51  53  Pulse 65 58 69      9.  HTN/HFpEF: continue hydralazine  25 mg twice daily and metoprolol              - Monitor blood pressures with increased activity - 9-25: Hypotensive into the 80s with mobilization; hold hydralazine , continue metoprolol  for rate control, will give 1 L IV fluid today.  Cardiology consult pending. - 9/26: Blood pressure much better today, 140s over 50s.  Daily weight stable. DC IV fluids, monitor.    -9/27-28/25 BPs improving, wt stable, monitor.   10-1: Blood pressure stable 120s to 130s, 50s diastolic.  Continue current regimen.  Monitor.  10-2: Some erroneous blood pressure readings this morning due to being done on left limb (prior known vascular issue per family); Place limb restriction, normotensive on right arm.   10-3: Weights uptrending with PO, however some increased edema in the lower extremities.  Will add Lasix  20 mg daily for gentle diuresis.  Notable, was on Lasix  20 mg twice daily prior to admission; was DC'd initially due to AKI and hypovolemia.  -11/18/23 edema better today, wt not done; Cr higher yesterday, will decrease lasix  to 10mg  daily starting tomorrow   Filed Weights   11/15/23 0459 11/16/23 0515 11/17/23 0424  Weight: 64 kg 62.9 kg 68.3 kg    10.  HLD: Crestor  transition to Lipitor  80 mg   11.   AKI: Baseline creatinine 1.2-1.5. Required IV fluids.  Currently 1.51    - 9-25: Slow improvement, creatinine 1.4.  IV fluids today as above.  - 9/29: P.o. intakes remain poor, BUN/creatinine up slightly, advised patient to increase p.o. intakes.  Repeat in AM.  9-30: BUN/creatinine improved.  10/2: Creatinine up slightly today.  Patient to work on increased p.o. intakes.  Repeat BMP in AM.  10-3: BMP pending today.  Weights and peripheral edema increases above, starting Lasix  20 mg daily.  11/18/23 Cr slightly higher 1.7 so will decrease lasix  to 10mg  daily starting tomorrow  12.  COPD:  continue Breztri , DuoNeb as needed             - Incentive spirometer   13. Constipation: last BM 9/22, add senna HS  - 9/25: add daily miralax    - 9/26: Give sorbitol  today -11/11/23 multiple BMs with sorbitol  (would be cautious in the future), cont regimen but monitor if still having BMs too often after clean out. -9/30 LBM small--Will increase MiraLAX  to twice daily and Senokot to 2 tablets nightly -11/18/23 LBM this morning. Monitor   14.  Anemia of chronic disease/nosebleeds.  Add iron  studies, FOBT today.  Add Protonix  40 mg twice daily AC for GI protection.  Repeat H&H in AM.  - Overdue for EPO, which she gets as outpatient, administered 9-29  - On supplementation with iron , vitamin C, B12, and folic acid  - 0-69: Hemoglobin stable, 8.1.  FOBT pending.  Iron  studies significant for anemia chronic disease  - 10-1: FOBT negative.  Hemoglobin remained stable, repeat labs tomorrow a.m.  10/2: stable 10-3: Prolonged nosebleed this a.m., self resolved.  Already on saline nose sprays 3 times daily, will add Afrin as needed. -11/18/23 nursing called me to the room for coughing up blood00 but nosebleed on exam with blood in posterior OP, seems to be resolving, asked nursing to please use afrin when these occur. Giving it now. Pt states she's feeling fine now. Advised nursing to call if it doesn't resolve.   I  spent >42mins performing patient care related activities, including face to face time x2, documentation time, re-evaluation of epistaxis later in day, discussion of epistaxis/orders with patient and nursing staff, and overall coordination of care.   LOS: 10 days A FACE TO FACE EVALUATION WAS PERFORMED  [address] 11/18/2023, 11:18 AM

## 2023-11-18 NOTE — Plan of Care (Signed)
  Problem: Consults Goal: RH STROKE PATIENT EDUCATION Description: See Patient Education module for education specifics  Outcome: Progressing   Problem: RH BOWEL ELIMINATION Goal: RH STG MANAGE BOWEL WITH ASSISTANCE Description: STG Manage Bowel with supervision-minimal Assistance. Outcome: Progressing   Problem: RH BLADDER ELIMINATION Goal: RH STG MANAGE BLADDER WITH ASSISTANCE Description: STG Manage Bladder With min- supervision Assistance Outcome: Progressing   Problem: RH SKIN INTEGRITY Goal: RH STG SKIN FREE OF INFECTION/BREAKDOWN Description: Manage skin free of infection/breakdown with min- supervision assistance  Outcome: Progressing   Problem: RH SAFETY Goal: RH STG ADHERE TO SAFETY PRECAUTIONS W/ASSISTANCE/DEVICE Description: STG Adhere to Safety Precautions With min- supervision Assistance/Device. Outcome: Progressing   Problem: RH PAIN MANAGEMENT Goal: RH STG PAIN MANAGED AT OR BELOW PT'S PAIN GOAL Description: <4 w/ prns Outcome: Progressing   Problem: RH KNOWLEDGE DEFICIT Goal: RH STG INCREASE KNOWLEDGE OF HYPERTENSION Description: Manage increase knowledge of hypertension with min- supervision assistance from sons using educational materials provided Outcome: Progressing Goal: RH STG INCREASE KNOWLEDGE OF DYSPHAGIA/FLUID INTAKE Description: Manage increase knowledge of dysphagia/fluid intake with min- supervision assistance from sons using educational materials provided Outcome: Progressing Goal: RH STG INCREASE KNOWLEGDE OF HYPERLIPIDEMIA Description: Manage increase knowledge of hyperlipidemia with min- supervision assistance from sons using educational materials provided Outcome: Progressing Goal: RH STG INCREASE KNOWLEDGE OF STROKE PROPHYLAXIS Description: Manage increase knowledge of stroke prophylaxis with min- supervision assistance from sons using educational materials provided Outcome: Progressing

## 2023-11-19 MED ORDER — ACETAMINOPHEN 500 MG PO TABS
500.0000 mg | ORAL_TABLET | Freq: Once | ORAL | Status: AC
  Administered 2023-11-19: 500 mg via ORAL
  Filled 2023-11-19: qty 1

## 2023-11-19 NOTE — Plan of Care (Signed)
  Problem: Consults Goal: RH STROKE PATIENT EDUCATION Description: See Patient Education module for education specifics  Outcome: Progressing   Problem: RH BOWEL ELIMINATION Goal: RH STG MANAGE BOWEL WITH ASSISTANCE Description: STG Manage Bowel with supervision-minimal Assistance. Outcome: Progressing   Problem: RH BLADDER ELIMINATION Goal: RH STG MANAGE BLADDER WITH ASSISTANCE Description: STG Manage Bladder With min- supervision Assistance Outcome: Progressing   Problem: RH SKIN INTEGRITY Goal: RH STG SKIN FREE OF INFECTION/BREAKDOWN Description: Manage skin free of infection/breakdown with min- supervision assistance  Outcome: Progressing   Problem: RH SAFETY Goal: RH STG ADHERE TO SAFETY PRECAUTIONS W/ASSISTANCE/DEVICE Description: STG Adhere to Safety Precautions With min- supervision Assistance/Device. Outcome: Progressing   Problem: RH PAIN MANAGEMENT Goal: RH STG PAIN MANAGED AT OR BELOW PT'S PAIN GOAL Description: <4 w/ prns Outcome: Progressing   Problem: RH KNOWLEDGE DEFICIT Goal: RH STG INCREASE KNOWLEDGE OF HYPERTENSION Description: Manage increase knowledge of hypertension with min- supervision assistance from sons using educational materials provided Outcome: Progressing Goal: RH STG INCREASE KNOWLEDGE OF DYSPHAGIA/FLUID INTAKE Description: Manage increase knowledge of dysphagia/fluid intake with min- supervision assistance from sons using educational materials provided Outcome: Progressing Goal: RH STG INCREASE KNOWLEGDE OF HYPERLIPIDEMIA Description: Manage increase knowledge of hyperlipidemia with min- supervision assistance from sons using educational materials provided Outcome: Progressing Goal: RH STG INCREASE KNOWLEDGE OF STROKE PROPHYLAXIS Description: Manage increase knowledge of stroke prophylaxis with min- supervision assistance from sons using educational materials provided Outcome: Progressing

## 2023-11-19 NOTE — Progress Notes (Signed)
 PROGRESS NOTE   Subjective/Complaints:  Pt doing good. Slept so-so but got some sleep eventually. Pain doing fine, still mostly just the skin tear. LBM yesterday but had another one after eval. Urinating fine. Nosebleed eventually stopped yesterday. No other complaints or concerns.   ROS: Denies fevers, chills, N/V, abdominal pain, constipation, diarrhea, cough, chest pain, new weakness or paraesthesias.   Poor appetite Knee pain Epistaxis  Objective:   No results found. No results for input(s): WBC, HGB, HCT, PLT in the last 72 hours.   Recent Labs    11/17/23 1014  NA 144  K 4.7  CL 110  CO2 23  GLUCOSE 95  BUN 34*  CREATININE 1.72*  CALCIUM  8.4*     Intake/Output Summary (Last 24 hours) at 11/19/2023 1039 Last data filed at 11/19/2023 0700 Gross per 24 hour  Intake 237 ml  Output --  Net 237 ml        Physical Exam: Vital Signs Blood pressure (!) 107/36, pulse 64, temperature 98.2 F (36.8 C), temperature source Oral, resp. rate 16, height 5' 2 (1.575 m), weight 66.7 kg, SpO2 99%.  Constitutional: No apparent distress. Appropriate appearance for age.  Sitting upright in bed. HENT: No JVD. Neck Supple. Trachea midline. Atraumatic, normocephalic. Eyes: PERRLA. EOMI. Visual fields grossly intact.  Cardiovascular: RRR, no murmurs/rub/gallops. Trace bilateral lower extremity edema. Respiratory: Global mostly diminished, clear to auscultation bilaterally,, no rales, rhonchi, or wheezing.  On 2 L nasal cannula--stable Abdomen: + bowel sounds, normoactive. No distention or tenderness. Soft.  Skin: skin tear RLE covered with ACE wrap.   PRIOR EXAMS: MSK: No apparent deformity.    + TTP bilateral knees along superior, lateral, and inferior margins of the patella--much better tolerance of palpation 10-3  Neurologic exam:  Cognition: AAO to person, place, and time with some cueing + Mild memory  deficits, otherwise cognition intact.  Within normal limits for age. Insight: Good  insight into current condition.  Mood: Pleasant affect, appropriate mood.  Sensation: Equal and intact in BL UE and Les.  Reflexes:  Negative Hoffman's and babinski signs bilaterally.  CN: 2-12 grossly intact.  Coordination: No apparent tremors. No ataxia on FTN, HTS bilaterally.  Spasticity: MAS 0 in all extremities.    Assessment/Plan: 1. Functional deficits which require 3+ hours per day of interdisciplinary therapy in a comprehensive inpatient rehab setting. Physiatrist is providing close team supervision and 24 hour management of active medical problems listed below. Physiatrist and rehab team continue to assess barriers to discharge/monitor patient progress toward functional and medical goals  Care Tool:  Bathing  Bathing activity did not occur: Safety/medical concerns (Limited eval due to elevated/variable HR (67-135 bpm).)           Bathing assist       Upper Body Dressing/Undressing Upper body dressing Upper body dressing/undressing activity did not occur (including orthotics): Safety/medical concerns (Limited eval due to elevated/variable HR (67-135 bpm).)      Upper body assist      Lower Body Dressing/Undressing Lower body dressing    Lower body dressing activity did not occur: Safety/medical concerns (Limited eval due to elevated/variable HR (67-135 bpm).) What is the patient  wearing?: Pants     Lower body assist Assist for lower body dressing: Minimal Assistance - Patient > 75%     Toileting Toileting    Toileting assist Assist for toileting: 2 Helpers     Transfers Chair/bed transfer  Transfers assist  Chair/bed transfer activity did not occur: Safety/medical concerns  Chair/bed transfer assist level: Minimal Assistance - Patient > 75% (w/ RW.)     Locomotion Ambulation   Ambulation assist   Ambulation activity did not occur: Safety/medical  concerns  Assist level: Minimal Assistance - Patient > 75% Assistive device: Walker-rolling Max distance: 20   Walk 10 feet activity   Assist  Walk 10 feet activity did not occur: Safety/medical concerns  Assist level: Minimal Assistance - Patient > 75% Assistive device: Walker-rolling   Walk 50 feet activity   Assist Walk 50 feet with 2 turns activity did not occur: Safety/medical concerns         Walk 150 feet activity   Assist Walk 150 feet activity did not occur: Safety/medical concerns         Walk 10 feet on uneven surface  activity   Assist Walk 10 feet on uneven surfaces activity did not occur: Safety/medical concerns   Assist level: Minimal Assistance - Patient > 75% Assistive device: Walker-rolling   Wheelchair     Assist Is the patient using a wheelchair?: Yes Type of Wheelchair: Manual Wheelchair activity did not occur: Safety/medical concerns         Wheelchair 50 feet with 2 turns activity    Assist    Wheelchair 50 feet with 2 turns activity did not occur: Safety/medical concerns       Wheelchair 150 feet activity     Assist  Wheelchair 150 feet activity did not occur: Safety/medical concerns       Blood pressure (!) 107/36, pulse 64, temperature 98.2 F (36.8 C), temperature source Oral, resp. rate 16, height 5' 2 (1.575 m), weight 66.7 kg, SpO2 99%.  Medical Problem List and Plan: 1. Functional deficits secondary to left thalamic capsular infarct             -patient may shower             -ELOS/Goals: 10-14 days S - 10/9             -Stable to continue CIR  - 9/30: Lives with husband who cannot provide assist; supervision goals currently, setup for UBD and Mod-Max LBD. Activity tolerance remains poor as well as balance. Min A goals and CGA for transfers goals. Mod I goals SLP. Per son not far off her baseline for cog. Dys 1 diet downgrade  -  apparently blended all her foods at home.    2.   Antithrombotics: -DVT/anticoagulation:  Mechanical: Sequential compression devices, below knee Bilateral lower extremities Pharmaceutical: Eliquis              -antiplatelet therapy: Eliquis  2.5 mg twice daily--tolerating  3. Pain Management: Tylenol  as needed   - 9/30: Start gabapentin 100 mg TID for LE neuropathy, pain  10-1: Ongoing severe back, head pain overnight.  Scheduled Tylenol  1000 mg 3 times daily.  10-2: DC gabapentin due to cognitive changes.  Add Voltaren gel to bilateral knees  4. Mood/Behavior/Sleep: LCSW to follow for evaluation and support when available.              -antipsychotic agents: [name]             - Continue melatonin  3 mg as needed at bedtime, also has trazodone  PRN 5. Neuropsych/cognition: This patient is capable of making decisions on her own behalf. 6. Skin/Wound Care: Routine pressure relief measures   - 9/30: R anterior skin tear from fall ; eucerin daily for dry skin  7. Fluids/Electrolytes/Nutrition: Monitor I&O Daily weights with follow-up labs. Continue ensure. Continue vitamins/supplements.              - SLP following; on DYS 3-- now DYS 2 - 9/26: Poor p.o. intakes for several months per family due to lack of appetite since finishing an antibiotic.  Nutrition consulted for recommendations, will start mirtazapine  7.5 mg nightly for appetite. - 9/29: P.o. intakes remain poor.  Start Megace 200 mg twice daily. 9-30: Downgraded to dysphagia 1 diet due to blending foods at home.  Monitor intakes on Megace.--Somewhat improved 10-1 10/2: Patient encouraged to drink at least 6 cups of water per day.  P.o. intakes are some weight better since starting Megace.  Repeat BMP in a.m.; may need to start IV fluids if no improvements     8.  A-fib: History of noncompliance with Eliquis .              - Resumed Eliquis              - continue Metoprolol  25 mg nightly for rate control, currently stable - 9/25: A-fib with RVR into the 150s with mobilization this a.m.  Got  additional dose of metoprolol  this a.m. for coverage.  Cardiology consulted, appreciate recommendations 9-26: Cardiology switched from long-acting metoprolol  to Lopressor  25 mg twice daily; patient is tolerating this much better.  DC IV fluids. -11/11/23 rate controlled, doing well on this regimen 10-1: Some rate variations overnight, staying between 55 and 101.  Overall stay within parameters. 10-3: Heart rate staying within normal parameters.    11/19/2023    3:32 AM 11/18/2023    8:46 PM 11/18/2023    8:14 PM  Vitals with BMI  Weight 147 lbs 1 oz    BMI 26.89    Systolic 107    Diastolic 36    Pulse 64 62 82      9.  HTN/HFpEF: continue hydralazine  25 mg twice daily and metoprolol              - Monitor blood pressures with increased activity - 9-25: Hypotensive into the 80s with mobilization; hold hydralazine , continue metoprolol  for rate control, will give 1 L IV fluid today.  Cardiology consult pending. - 9/26: Blood pressure much better today, 140s over 50s.  Daily weight stable. DC IV fluids, monitor.    -9/27-28/25 BPs improving, wt stable, monitor.   10-1: Blood pressure stable 120s to 130s, 50s diastolic.  Continue current regimen.  Monitor.  10-2: Some erroneous blood pressure readings this morning due to being done on left limb (prior known vascular issue per family); Place limb restriction, normotensive on right arm.   10-3: Weights uptrending with PO, however some increased edema in the lower extremities.  Will add Lasix  20 mg daily for gentle diuresis.  Notable, was on Lasix  20 mg twice daily prior to admission; was DC'd initially due to AKI and hypovolemia. -11/18/23 edema better today, wt not done; Cr higher yesterday, will decrease lasix  to 10mg  daily starting tomorrow -11/19/23 wt down, labs tomorrow to check on Cr. Edema stable/improved on exam.    Filed Weights   11/16/23 0515 11/17/23 0424 11/19/23 0332  Weight: 62.9 kg 68.3 kg 66.7 kg  10.  HLD: Crestor  transition  to Lipitor  80 mg   11.  AKI: Baseline creatinine 1.2-1.5. Required IV fluids.  Currently 1.51    - 9-25: Slow improvement, creatinine 1.4.  IV fluids today as above.  - 9/29: P.o. intakes remain poor, BUN/creatinine up slightly, advised patient to increase p.o. intakes.  Repeat in AM.  9-30: BUN/creatinine improved.  10/2: Creatinine up slightly today.  Patient to work on increased p.o. intakes.  Repeat BMP in AM.  10-3: BMP pending today.  Weights and peripheral edema increases above, starting Lasix  20 mg daily.  11/18/23 Cr slightly higher 1.7 so will decrease lasix  to 10mg  daily starting tomorrow-- recheck labs 10/6  12.  COPD:  continue Breztri , DuoNeb as needed             - Incentive spirometer   13. Constipation: last BM 9/22, add senna HS  - 9/25: add daily miralax    - 9/26: Give sorbitol  today -11/11/23 multiple BMs with sorbitol  (would be cautious in the future), cont regimen but monitor if still having BMs too often after clean out. -9/30 LBM small--Will increase MiraLAX  to twice daily and Senokot to 2 tablets nightly -11/19/23 LBM this morning. Monitor   14.  Anemia of chronic disease/nosebleeds.  Add iron  studies, FOBT today.  Add Protonix  40 mg twice daily AC for GI protection.  Repeat H&H in AM.  - Overdue for EPO, which she gets as outpatient, administered 9-29  - On supplementation with iron , vitamin C, B12, and folic acid  - 0-69: Hemoglobin stable, 8.1.  FOBT pending.  Iron  studies significant for anemia chronic disease  - 10-1: FOBT negative.  Hemoglobin remained stable, repeat labs tomorrow a.m.  10/2: stable 10-3: Prolonged nosebleed this a.m., self resolved.  Already on saline nose sprays 3 times daily, will add Afrin as needed. -11/18/23 nursing called me to the room for coughing up blood-- but nosebleed on exam with blood in posterior OP, seems to be resolving, asked nursing to please use afrin when these occur. Giving it now. Pt states she's feeling fine now.  Advised nursing to call if it doesn't resolve.  -11/19/23 no further epistaxis reported. Monitor.     LOS: 11 days A FACE TO FACE EVALUATION WAS PERFORMED  [id number] Dandrea Medders 11/19/2023, 10:39 AM

## 2023-11-20 LAB — CBC
HCT: 27.9 % — ABNORMAL LOW (ref 36.0–46.0)
Hemoglobin: 8.1 g/dL — ABNORMAL LOW (ref 12.0–15.0)
MCH: 28.7 pg (ref 26.0–34.0)
MCHC: 29 g/dL — ABNORMAL LOW (ref 30.0–36.0)
MCV: 98.9 fL (ref 80.0–100.0)
Platelets: 257 K/uL (ref 150–400)
RBC: 2.82 MIL/uL — ABNORMAL LOW (ref 3.87–5.11)
RDW: 16.1 % — ABNORMAL HIGH (ref 11.5–15.5)
WBC: 7 K/uL (ref 4.0–10.5)
nRBC: 0 % (ref 0.0–0.2)

## 2023-11-20 LAB — BASIC METABOLIC PANEL WITH GFR
Anion gap: 12 (ref 5–15)
BUN: 44 mg/dL — ABNORMAL HIGH (ref 8–23)
CO2: 20 mmol/L — ABNORMAL LOW (ref 22–32)
Calcium: 8.5 mg/dL — ABNORMAL LOW (ref 8.9–10.3)
Chloride: 110 mmol/L (ref 98–111)
Creatinine, Ser: 1.89 mg/dL — ABNORMAL HIGH (ref 0.44–1.00)
GFR, Estimated: 25 mL/min — ABNORMAL LOW (ref >60)
Glucose, Bld: 86 mg/dL (ref 70–99)
Potassium: 5.2 mmol/L — ABNORMAL HIGH (ref 3.5–5.1)
Sodium: 142 mmol/L (ref 135–145)

## 2023-11-20 MED ORDER — OXYMETAZOLINE HCL 0.05 % NA SOLN
1.0000 | Freq: Two times a day (BID) | NASAL | Status: DC | PRN
  Administered 2023-11-22 – 2023-11-23 (×2): 1 via NASAL

## 2023-11-20 MED ORDER — SODIUM CHLORIDE 0.9 % IV SOLN: INTRAVENOUS | Status: AC

## 2023-11-20 MED ORDER — METHOCARBAMOL 500 MG PO TABS: 500.0000 mg | ORAL_TABLET | Freq: Three times a day (TID) | ORAL | Status: DC | PRN

## 2023-11-20 NOTE — Progress Notes (Signed)
 Speech Language Pathology Daily Session Note  Patient Details  Name: Courtney Cline MRN: [id number] Date of Birth: August 09, 1933  Today's Date: 11/20/2023 SLP Individual Time: 9062-8996 SLP Individual Time Calculation (min): 26 min  Short Term Goals: Week 2: SLP Short Term Goal 1 (Week 2): STGs = LTGs d/t ELOS  Skilled Therapeutic Interventions:  Patient was seen in am for family education session. Pt's 2 sons present and active participants in session. SLP reviewed ST POC with focus on language, cognition, and swallowing goals. SLP reviewed current diet and dietary recommendations while including a list of D1 foods and foods to avoid. Pt and family remain in agreement with current diet. SLP also reviewed cognitive linguistic changes with family reporting improvement though noted pt is not back to baseline. SLP introduced/ reviewed internal and external strategies and strategies to maximize independence. SLP also discussed general brain health recommendations and tasks to maximize language function. Pt and her sons's asked appropriate questions and added insight as needed. All information was supplemented via handouts. Pt was left upright in bed with call button within reach, bed alarm active, and sons present. SLP to continue POC.   Pain Pain Assessment Pain Scale: 0-10 Pain Score: 0-No pain  Therapy/Group: Individual Therapy  [name] Fuss 11/20/2023, 12:36 PM

## 2023-11-20 NOTE — Progress Notes (Signed)
 PROGRESS NOTE   Subjective/Complaints:  No events overnight.  No acute complaints.  Continues with some pain in the lower extremities, but overall well-controlled.  Is complaining of cramping pain in her right greater than left calf.  Vitals stable     11/20/2023    5:16 AM 11/20/2023    5:13 AM 11/19/2023    7:11 PM  Vitals with BMI  Weight  142 lbs 10 oz   BMI  26.08   Systolic 114  125  Diastolic 52  57  Pulse 65  74    Cr increasing 1.89 today, BUN 44, other labs stable; PO fluid intakes 250 cc per day  P.o. intakes appropriate 25-75% meals; improving overall.  Continent of bladder  Last BM 10/5  ROS: Denies fevers, chills, N/V, abdominal pain, constipation, diarrhea, cough, chest pain, new weakness or paraesthesias.   Poor appetite Knee pain, calf pain Epistaxis  Objective:   No results found. Recent Labs    11/20/23 0625  WBC 7.0  HGB 8.1*  HCT 27.9*  PLT 257     Recent Labs    11/17/23 1014 11/20/23 0625  NA 144 142  K 4.7 5.2*  CL 110 110  CO2 23 20*  GLUCOSE 95 86  BUN 34* 44*  CREATININE 1.72* 1.89*  CALCIUM  8.4* 8.5*     Intake/Output Summary (Last 24 hours) at 11/20/2023 0817 Last data filed at 11/19/2023 2300 Gross per 24 hour  Intake 240 ml  Output --  Net 240 ml        Physical Exam: Vital Signs Blood pressure (!) 114/52, pulse 65, temperature 98.4 F (36.9 C), temperature source Oral, resp. rate 16, height 5' 2 (1.575 m), weight 64.7 kg, SpO2 100%.  Constitutional: No apparent distress. Appropriate appearance for age.  Sitting upright in bedside chair. HENT: No JVD. Neck Supple. Trachea midline. Atraumatic, normocephalic. Eyes: PERRLA. EOMI. Visual fields grossly intact.  Cardiovascular: RRR, no murmurs/rub/gallops. Trace bilateral lower extremity edema. Respiratory: Global mostly diminished, clear to auscultation bilaterally,, no rales, rhonchi, or wheezing.  On 4 L  nasal cannula after working with therapies. Abdomen: + bowel sounds, normoactive. No distention or tenderness. Soft.  Skin: skin tear RLE covered with ACE wrap.   MSK: No apparent deformity.    + TTP bilateral knees, R>>L calf  Neurologic exam:  Cognition: AAO to person, place, and time with some cueing + Mild memory deficits, otherwise cognition intact.  Within normal limits for age. Insight: Good  insight into current condition.  Mood: Pleasant affect, appropriate mood.  Sensation: Equal and intact in BL UE and Les.  Reflexes:  Negative Hoffman's and babinski signs bilaterally.  CN: 2-12 grossly intact.  Coordination: No apparent tremors. No ataxia on FTN, HTS bilaterally.  Spasticity: MAS 0 in all extremities.    Assessment/Plan: 1. Functional deficits which require 3+ hours per day of interdisciplinary therapy in a comprehensive inpatient rehab setting. Physiatrist is providing close team supervision and 24 hour management of active medical problems listed below. Physiatrist and rehab team continue to assess barriers to discharge/monitor patient progress toward functional and medical goals  Care Tool:  Bathing  Bathing activity did not occur:  Safety/medical concerns (Limited eval due to elevated/variable HR (67-135 bpm).)           Bathing assist       Upper Body Dressing/Undressing Upper body dressing Upper body dressing/undressing activity did not occur (including orthotics): Safety/medical concerns (Limited eval due to elevated/variable HR (67-135 bpm).)      Upper body assist      Lower Body Dressing/Undressing Lower body dressing    Lower body dressing activity did not occur: Safety/medical concerns (Limited eval due to elevated/variable HR (67-135 bpm).) What is the patient wearing?: Pants     Lower body assist Assist for lower body dressing: Minimal Assistance - Patient > 75%     Toileting Toileting    Toileting assist Assist for toileting: 2  Helpers     Transfers Chair/bed transfer  Transfers assist  Chair/bed transfer activity did not occur: Safety/medical concerns  Chair/bed transfer assist level: Minimal Assistance - Patient > 75% (w/ RW.)     Locomotion Ambulation   Ambulation assist   Ambulation activity did not occur: Safety/medical concerns  Assist level: Minimal Assistance - Patient > 75% Assistive device: Walker-rolling Max distance: 20   Walk 10 feet activity   Assist  Walk 10 feet activity did not occur: Safety/medical concerns  Assist level: Minimal Assistance - Patient > 75% Assistive device: Walker-rolling   Walk 50 feet activity   Assist Walk 50 feet with 2 turns activity did not occur: Safety/medical concerns         Walk 150 feet activity   Assist Walk 150 feet activity did not occur: Safety/medical concerns         Walk 10 feet on uneven surface  activity   Assist Walk 10 feet on uneven surfaces activity did not occur: Safety/medical concerns   Assist level: Minimal Assistance - Patient > 75% Assistive device: Walker-rolling   Wheelchair     Assist Is the patient using a wheelchair?: Yes Type of Wheelchair: Manual Wheelchair activity did not occur: Safety/medical concerns         Wheelchair 50 feet with 2 turns activity    Assist    Wheelchair 50 feet with 2 turns activity did not occur: Safety/medical concerns       Wheelchair 150 feet activity     Assist  Wheelchair 150 feet activity did not occur: Safety/medical concerns       Blood pressure (!) 114/52, pulse 65, temperature 98.4 F (36.9 C), temperature source Oral, resp. rate 16, height 5' 2 (1.575 m), weight 64.7 kg, SpO2 100%.  Medical Problem List and Plan: 1. Functional deficits secondary to left thalamic capsular infarct             -patient may shower             -ELOS/Goals: 10-14 days S - 10/9             -Stable to continue CIR  - 9/30: Lives with husband who cannot  provide assist; supervision goals currently, setup for UBD and Mod-Max LBD. Activity tolerance remains poor as well as balance. Min A goals and CGA for transfers goals. Mod I goals SLP. Per son not far off her baseline for cog. Dys 1 diet downgrade  -  apparently blended all her foods at home.    2.  Antithrombotics: -DVT/anticoagulation:  Mechanical: Sequential compression devices, below knee Bilateral lower extremities Pharmaceutical: Eliquis              -antiplatelet therapy: Eliquis  2.5 mg  twice daily--tolerating  3. Pain Management: Tylenol  as needed   - 9/30: Start gabapentin 100 mg TID for LE neuropathy, pain  10-1: Ongoing severe back, head pain overnight.  Scheduled Tylenol  1000 mg 3 times daily.  10-2: DC gabapentin due to cognitive changes.  Add Voltaren gel to bilateral knees  10-6: Add Robaxin  500 mg every 8 hours as needed for muscle cramps  4. Mood/Behavior/Sleep: LCSW to follow for evaluation and support when available.              -antipsychotic agents: [name]             - Continue melatonin 3 mg as needed at bedtime, also has trazodone  PRN 5. Neuropsych/cognition: This patient is capable of making decisions on her own behalf. 6. Skin/Wound Care: Routine pressure relief measures   - 9/30: R anterior skin tear from fall ; eucerin daily for dry skin  7. Fluids/Electrolytes/Nutrition: Monitor I&O Daily weights with follow-up labs. Continue ensure. Continue vitamins/supplements.              - SLP following; on DYS 3-- now DYS 2 - 9/26: Poor p.o. intakes for several months per family due to lack of appetite since finishing an antibiotic.  Nutrition consulted for recommendations, will start mirtazapine  7.5 mg nightly for appetite. - 9/29: P.o. intakes remain poor.  Start Megace 200 mg twice daily. 9-30: Downgraded to dysphagia 1 diet due to blending foods at home.  Monitor intakes on Megace.--Somewhat improved 10-1 10/2: Patient encouraged to drink at least 6 cups of water per  day.  P.o. intakes are some weight better since starting Megace.  Repeat BMP in a.m.; may need to start IV fluids if no improvements 10/6: To get 1 L IV fluid at 75 mL/h today, discussed minimal p.o. fluid intakes and need to significantly increase these.     8.  A-fib: History of noncompliance with Eliquis .              - Resumed Eliquis              - continue Metoprolol  25 mg nightly for rate control, currently stable - 9/25: A-fib with RVR into the 150s with mobilization this a.m.  Got additional dose of metoprolol  this a.m. for coverage.  Cardiology consulted, appreciate recommendations 9-26: Cardiology switched from long-acting metoprolol  to Lopressor  25 mg twice daily; patient is tolerating this much better.  DC IV fluids. -11/11/23 rate controlled, doing well on this regimen 10-1: Some rate variations overnight, staying between 55 and 101.  Overall stay within parameters. 10-3: Heart rate staying within normal parameters. 10/6: Intermittent A-fib per PT, rate controlled.    11/20/2023    5:16 AM 11/20/2023    5:13 AM 11/19/2023    7:11 PM  Vitals with BMI  Weight  142 lbs 10 oz   BMI  26.08   Systolic 114  125  Diastolic 52  57  Pulse 65  74      9.  HTN/HFpEF: continue hydralazine  25 mg twice daily and metoprolol              - Monitor blood pressures with increased activity - 9-25: Hypotensive into the 80s with mobilization; hold hydralazine , continue metoprolol  for rate control, will give 1 L IV fluid today.  Cardiology consult pending. - 9/26: Blood pressure much better today, 140s over 50s.  Daily weight stable. DC IV fluids, monitor.    -9/27-28/25 BPs improving, wt stable, monitor.   10-1: Blood pressure stable  120s to 130s, 50s diastolic.  Continue current regimen.  Monitor.  10-2: Some erroneous blood pressure readings this morning due to being done on left limb (prior known vascular issue per family); Place limb restriction, normotensive on right arm.   10-3: Weights  uptrending with PO, however some increased edema in the lower extremities.  Will add Lasix  20 mg daily for gentle diuresis.  Notable, was on Lasix  20 mg twice daily prior to admission; was DC'd initially due to AKI and hypovolemia. -11/18/23 edema better today, wt not done; Cr higher yesterday, will decrease lasix  to 10mg  daily starting tomorrow -11/19/23 wt down, labs tomorrow to check on Cr. Edema stable/improved on exam.  10-6: IV fluid today as above.  Peripheral edema stable.  DC Lasix .   Filed Weights   11/17/23 0424 11/19/23 0332 11/20/23 0513  Weight: 68.3 kg 66.7 kg 64.7 kg    10.  HLD: Crestor  transition to Lipitor  80 mg   11.  AKI: Baseline creatinine 1.2-1.5. Required IV fluids.  Currently 1.51    - 9-25: Slow improvement, creatinine 1.4.  IV fluids today as above.  - 9/29: P.o. intakes remain poor, BUN/creatinine up slightly, advised patient to increase p.o. intakes.  Repeat in AM.  9-30: BUN/creatinine improved.  10/2: Creatinine up slightly today.  Patient to work on increased p.o. intakes.  Repeat BMP in AM.  10-3: BMP pending today.  Weights and peripheral edema increases above, starting Lasix  20 mg daily.  11/18/23 Cr slightly higher 1.7 so will decrease lasix  to 10mg  daily starting tomorrow-- recheck labs 10/6  10/8 Cr 1.88; DC lasix ; 1 L NS at 75/hr today, recheck in AM  12.  COPD:  continue Breztri , DuoNeb as needed             - Incentive spirometer   13. Constipation: last BM 9/22, add senna HS  - 9/25: add daily miralax    - 9/26: Give sorbitol  today -11/11/23 multiple BMs with sorbitol  (would be cautious in the future), cont regimen but monitor if still having BMs too often after clean out. -9/30 LBM small--Will increase MiraLAX  to twice daily and Senokot to 2 tablets nightly -11/19/23 LBM this morning. Monitor   14.  Anemia of chronic disease/nosebleeds.  Add iron  studies, FOBT today.  Add Protonix  40 mg twice daily AC for GI protection.  Repeat H&H in AM.  -  Overdue for EPO, which she gets as outpatient, administered 9-29  - On supplementation with iron , vitamin C, B12, and folic acid  - 0-69: Hemoglobin stable, 8.1.  FOBT pending.  Iron  studies significant for anemia chronic disease  - 10-1: FOBT negative.  Hemoglobin remained stable, repeat labs tomorrow a.m.  10/2: stable 10-3: Prolonged nosebleed this a.m., self resolved.  Already on saline nose sprays 3 times daily, will add Afrin as needed. -11/18/23 nursing called me to the room for coughing up blood-- but nosebleed on exam with blood in posterior OP, seems to be resolving, asked nursing to please use afrin when these occur. Giving it now. Pt states she's feeling fine now. Advised nursing to call if it doesn't resolve.  -11/19/23 no further epistaxis reported. Monitor.     LOS: 12 days A FACE TO FACE EVALUATION WAS PERFORMED  Joesph JAYSON Likes 11/20/2023, 8:17 AM

## 2023-11-20 NOTE — Progress Notes (Signed)
 Occupational Therapy Session Note  Patient Details  Name: Senia Even MRN: [id number] Date of Birth: 1933-04-26  Today's Date: 11/20/2023 OT Individual Time: 9169-9064 OT Individual Time Calculation (min): 65 min    Short Term Goals: Week 2:  OT Short Term Goal 1 (Week 2): STGs=LTGs due to patient's estimated length of stay.   Skilled Therapeutic Interventions/Progress Updates:    Pt received supine with no c/o pain, agreeable to OT session. Family education session completed with pt and her two sons [name] and [name]. Verbal education provided re fall risk reduction, energy conservation strategies, home carryover of transfer training, ADLs, and IADLs. Demonstration and hands on training completed for pt performance of UB/LB bathing and dressing at min A level, toileting hygiene and transfers, and discussed shower transfers. Provided education and demonstration on DME use recommendations at home. Pt completed sit > stands during session at [facility] level with the RW but required cueing for hand placement and RW management. Her sons practiced hands on transfers and were able to cue her effectively. She will require min A with toileting hygiene and has a long time habit of bringing stool forward while wiping, increasing her risk of UTI- provided this edu to family. She was on 3-4 L O2 throughout session and had a stable Spo2 despite intermittent SOB. Pass off to SLP.   Therapy Documentation Precautions:  Precautions Precautions: Fall Recall of Precautions/Restrictions: Impaired Precaution/Restrictions Comments: Watch HR and BP; 4L O2 [place] Restrictions Weight Bearing Restrictions Per Provider Order: No  Therapy/Group: Individual Therapy  Nena VEAR Moats 11/20/2023, 7:48 AM

## 2023-11-20 NOTE — Progress Notes (Addendum)
 Physical Therapy Session Note  Patient Details  Name: Courtney Cline MRN: [id number] Date of Birth: [birth year]  Today's Date: 11/20/2023 PT Individual Time: 1006-1101 PT Individual Time Calculation (min): 55 min   Short Term Goals: Week 2:  PT Short Term Goal 1 (Week 2): STG = LTG due to ELOS  Skilled Therapeutic Interventions/Progress Updates:    Pt presents in room in bed with pt sons present for family education. Session focused on therapeutic activities to facilitate family education and discharge planning. Pt completes bed mobility with supervision for supine<>sit, elevated HOB. Pt completes sit to stand transfers throughout session with CGA/close supervision to RW, significant improvement in postural stability. Pt completes stand step transfers with CGA with RW. Pt hooked up to portable supplemental O2 for session. Pt transported to main gym via WC dependently for time management and energy conservation. Pt ambulates 3x45' first with therapist providing CGA, 2nd and 3rd completed with both pt sons providing CGA, pt requires cues for turning for managing O2 line and one slight LOB to R with turning that pt was able to self correct with CGA. Pt does require cues for turning and managing RW with transfer to sitting following gait trial. Pt and family provided with education on household and community walking distance with all verbalizing understanding. Pt provided with seated rest breaks between all gait trials to promote energy conservation and quality with tasks.' Pt completes up/down 4 steps x2 trials, side stepping using RHR to simulate home set up. Pt completes with CGA/light min assist with therapist for first trial, cues for foot placement with pt demonstrating good postural stability. Pt then completes 2nd trial with pt son [name] providing CGA/min assist, pt with more fatigue with 2nd trial and requires increased assist and cueing, pt son able to provide guarding and cues with minimal  assist from therapist. Pt returns to room and completes ambulatory transfer back to bed with RW and cues for positioning from therapist, CGA. Pt returns to semi reclined in bed and remains with all needs within reach, cal light in place and bed alarm activated at end of session.   Therapy Documentation Precautions:  Precautions Precautions: Fall Recall of Precautions/Restrictions: Impaired Precaution/Restrictions Comments: Watch HR and BP; 4L O2 [place] Restrictions Weight Bearing Restrictions Per Provider Order: No    Therapy/Group: Individual Therapy  Reche Ohara PT, DPT 11/20/2023, 12:38 PM

## 2023-11-20 NOTE — Progress Notes (Signed)
 Physical Therapy Session Note  Patient Details  Name: Charlena Haub MRN: [id number] Date of Birth: 09/16/1933  Today's Date: 11/20/2023 PT Individual Time: 1336-1416 PT Individual Time Calculation (min): 40 min   Short Term Goals: Week 2:  PT Short Term Goal 1 (Week 2): STG = LTG due to ELOS  Skilled Therapeutic Interventions/Progress Updates:    Pt presents in room in bed, agreeable to PT. Pt reporting pain in RLE however pt agreeable to mobility, does not report any increase in pain with mobility. Session focused on therapeutic activities to facilitate upright tolerance and activity tolerance with cues for breathing mechanics with activity and rest. Pt completes bed mobility supervision, completes sit to stand with CGA/close supervision with RW throughout session. Pt ambulates from room with RW 96' with CGA and +2 WC follow for fatigue, pt requires x3 standing rest breaks with cues for breathing mechanics. Pt takes seated rest break then ambulates 55' into gym to sit on EOM with CGA. Pt requires extended seated rest break for recovery. Pt then completes continuous training on nustep BUE/BLE L1 resistance for 7 min total, cues for breathing mechanics with activity. Pt completes stand step transfer back to [facility], returns to room and remains seated in [facility] with all needs within reach, cal light in place and pt son present and cleared for transfers at end of session.   Therapy Documentation Precautions:  Precautions Precautions: Fall Recall of Precautions/Restrictions: Impaired Precaution/Restrictions Comments: Watch HR and BP; 4L O2 [place] Restrictions Weight Bearing Restrictions Per Provider Order: No    Therapy/Group: Individual Therapy  Reche Ohara PT, DPT 11/20/2023, 4:01 PM

## 2023-11-21 ENCOUNTER — Inpatient Hospital Stay (HOSPITAL_COMMUNITY)

## 2023-11-21 ENCOUNTER — Ambulatory Visit: Admitting: Cardiovascular Disease

## 2023-11-21 LAB — BASIC METABOLIC PANEL WITH GFR
Anion gap: 7 (ref 5–15)
BUN: 49 mg/dL — ABNORMAL HIGH (ref 8–23)
CO2: 27 mmol/L (ref 22–32)
Calcium: 8.5 mg/dL — ABNORMAL LOW (ref 8.9–10.3)
Chloride: 110 mmol/L (ref 98–111)
Creatinine, Ser: 1.97 mg/dL — ABNORMAL HIGH (ref 0.44–1.00)
GFR, Estimated: 24 mL/min — ABNORMAL LOW (ref >60)
Glucose, Bld: 98 mg/dL (ref 70–99)
Potassium: 5 mmol/L (ref 3.5–5.1)
Sodium: 144 mmol/L (ref 135–145)

## 2023-11-21 MED ORDER — SODIUM CHLORIDE 0.9 % IV SOLN: INTRAVENOUS | Status: DC

## 2023-11-21 NOTE — Patient Care Conference (Signed)
 Inpatient RehabilitationTeam Conference and Plan of Care Update Date: 11/21/2023   Time: 1055 am    Patient Name: Courtney Cline [facility]      Medical Record Number: [id number]  Date of Birth: [birth year] Sex: Female         Room/Bed: 4W05C/4W05C-01 Payor Info: Payor: [employer] MEDICARE / Plan: [employer] MEDICARE HMO / Product Type: *No Product type* /    Admit Date/Time:  11/08/2023  4:37 PM  Primary Diagnosis:  CVA (cerebral vascular accident) [facility])  Hospital Problems: Principal Problem:   CVA (cerebral vascular accident) [facility]) Active Problems:   Chronic heart failure with preserved ejection fraction [facility])    Expected Discharge Date: Expected Discharge Date: 11/23/23  Team Members Present: Physician leading conference: Dr. Joesph Likes Social Worker Present: Graeme Jude, LCSW Nurse Present: Eulalio Falls, RN PT Present: Elam Ohara, PT OT Present: [name], OT SLP Present: Recardo Mole, SLP PPS Coordinator present : Eleanor Colon, SLP     Current Status/Progress Goal Weekly Team Focus  Bowel/Bladder   Continent of bowel/bladder. LBM 10/6   Remain continent of bowel/bladder while in Rehab   Assess bowel/bladder function q shift and as needed    Swallow/Nutrition/ Hydration   d1/thin   modI  aspiration precautions, use of compensatory strategies    ADL's   Family education completed- Min A for LB care (threading due to decreased functional reach), (S) UB ADLs, CGA-(S) transfers with RW   Min A self care goals, CGA for transfers   ADLs, transfers, d/c planning, family education, balance    Mobility   supervision bed mobility, transfers CGA, gait 100' with RW CGA   supervision ambulatory  barriers: endurance; focus on gait training, global strengthening/endurance, dynamic standing balance    Communication   mild expressive deficits   supervision   compensatory strategies, functional language tasks    Safety/Cognition/ Behavioral Observations   supervision for functional problem solving   supervision   cognitive re training, pt/family edu,    Pain   Conplained of pain, Tylenol  on board   >2   Assess and treat pain q shift and as needed    Skin   Skin trar left leg   skin to be free of infection  Assess skin q shift and as needed      Discharge Planning:  Pt will discharge to home with her husbnad, and support from son and DIL who will be moving into the home to assist if needed. Fam edu completed on 10/6 8am-11am. SW will confirm there are no barriers to discharge.    Team Discussion: Patient was admitted post left thalamic capsular infarct. Patient with  RLE pain/ poor po intake/constipation: medication adjusted by MD. Patient progress limited by hand weakness, poor activity tolerance as well as balance, mild expressive aphasia and poor endurance.  Patient on target to meet rehab goals: Currently patient needs minimal assistance with lower body care and supervision assistance with upper body care. Patient needs supervision assistance with transfers . Patient was able to ambulate up to 150' using a rolling walker. Patient needs supervision assistance with cognition due to mild cognitive deficits. Overall goals at discharge are set for supervision assistance. Patient on track with discharge.  *See Care Plan and progress notes for long and short-term goals.   Revisions to Treatment Plan:  Dysphagia 1 diet  Teaching Needs: Safety, medications, transfers, wound care, toileting, etc.   Current Barriers to Discharge: Decreased caregiver support, Incontinence, and chronic oxygen  use.  Possible Resolutions to  Barriers: Family Education Home health follow up DME: BSC, RW Family has W/C      Medical Summary Current Status: medically complicated by respiratory failure, calf pain, severe protein calorie malnutrition, anemia, AKI, and atrial fibrillation  Barriers to Discharge: Electrolyte abnormality;Inadequate  Nutritional Intake;Incontinence;Medical stability;Renal Insufficiency/Failure;Self-care education;Uncontrolled Pain;Oxygen  Requirement;Cardiac Complications;Volume Overload;Uncontrolled Diabetes   Possible Resolutions to [employer]: titrate medications for calf pain,d dietary involvements for poor POs, monitor labs for AKI / dehydration and IVF today, monitor vitals for heart rhythm, CXR for increasing O2 requirements   Continued Need for Acute Rehabilitation Level of Care: The patient requires daily medical management by a physician with specialized training in physical medicine and rehabilitation for the following reasons: Direction of a multidisciplinary physical rehabilitation program to maximize functional independence : Yes Medical management of patient stability for increased activity during participation in an intensive rehabilitation regime.: Yes Analysis of laboratory values and/or radiology reports with any subsequent need for medication adjustment and/or medical intervention. : Yes   I attest that I was present, lead the team conference, and concur with the assessment and plan of the team.   Maegen Wigle Gayo 11/21/2023, 1055 am

## 2023-11-21 NOTE — Progress Notes (Signed)
 PROGRESS NOTE   Subjective/Complaints:  No events overnight.  No acute complaints.  RLE pain remains constant but bearable.  Worsening AKI, GFR stable, resume IVF; no apparent medicaiton contributoirs  ROS: Denies fevers, chills, N/V, abdominal pain, constipation, diarrhea, cough, chest pain, new weakness or paraesthesias.   Poor appetite Knee pain, calf pain Epistaxis  Objective:   No results found. Recent Labs    11/20/23 0625  WBC 7.0  HGB 8.1*  HCT 27.9*  PLT 257     Recent Labs    11/20/23 0625 11/21/23 0504  NA 142 144  K 5.2* 5.0  CL 110 110  CO2 20* 27  GLUCOSE 86 98  BUN 44* 49*  CREATININE 1.89* 1.97*  CALCIUM  8.5* 8.5*     Intake/Output Summary (Last 24 hours) at 11/21/2023 0859 Last data filed at 11/21/2023 0800 Gross per 24 hour  Intake 720 ml  Output --  Net 720 ml        Physical Exam: Vital Signs Blood pressure (!) 146/53, pulse 69, temperature 98.4 F (36.9 C), temperature source Oral, resp. rate 18, height 5' 2 (1.575 m), weight 65.2 kg, SpO2 99%.  Constitutional: No apparent distress. Appropriate appearance for age.  Sitting upright in bedside chair. HENT: No JVD. Neck Supple. Trachea midline. Atraumatic, normocephalic. Eyes: PERRLA. EOMI. Visual fields grossly intact.  Cardiovascular: RRR, no murmurs/rub/gallops. Trace bilateral lower extremity edema. Respiratory: Globaly diminished, clear to auscultation bilaterally, no rales, rhonchi, or wheezing.  On 4 L nasal cannula  Abdomen: + bowel sounds, normoactive. No distention or tenderness. Soft.  Skin: skin tear RLE covered with ACE wrap.   MSK: No apparent deformity.    + TTP bilateral knees, R calf  Neurologic exam:  Cognition: AAO to person, place, and time with some cueing + Mild memory deficits, otherwise cognition intact.  Insight: Good  insight into current condition.  Mood: Pleasant affect, appropriate mood.   Sensation: Equal and intact in BL UE and Les.  Reflexes:  Negative Hoffman's and babinski signs bilaterally.  CN: 2-12 grossly intact.  Coordination: No apparent tremors. No ataxia on FTN, HTS bilaterally.  Spasticity: MAS 0 in all extremities.  Strength: 4/5 throughout BL UE and LEs  Assessment/Plan: 1. Functional deficits which require 3+ hours per day of interdisciplinary therapy in a comprehensive inpatient rehab setting. Physiatrist is providing close team supervision and 24 hour management of active medical problems listed below. Physiatrist and rehab team continue to assess barriers to discharge/monitor patient progress toward functional and medical goals  Care Tool:  Bathing  Bathing activity did not occur: Safety/medical concerns (Limited eval due to elevated/variable HR (67-135 bpm).)           Bathing assist       Upper Body Dressing/Undressing Upper body dressing Upper body dressing/undressing activity did not occur (including orthotics): Safety/medical concerns (Limited eval due to elevated/variable HR (67-135 bpm).)      Upper body assist      Lower Body Dressing/Undressing Lower body dressing    Lower body dressing activity did not occur: Safety/medical concerns (Limited eval due to elevated/variable HR (67-135 bpm).) What is the patient wearing?: Pants  Lower body assist Assist for lower body dressing: Minimal Assistance - Patient > 75%     Toileting Toileting    Toileting assist Assist for toileting: 2 Helpers     Transfers Chair/bed transfer  Transfers assist  Chair/bed transfer activity did not occur: Safety/medical concerns  Chair/bed transfer assist level: Minimal Assistance - Patient > 75% (w/ RW.)     Locomotion Ambulation   Ambulation assist   Ambulation activity did not occur: Safety/medical concerns  Assist level: Minimal Assistance - Patient > 75% Assistive device: Walker-rolling Max distance: 20   Walk 10 feet  activity   Assist  Walk 10 feet activity did not occur: Safety/medical concerns  Assist level: Minimal Assistance - Patient > 75% Assistive device: Walker-rolling   Walk 50 feet activity   Assist Walk 50 feet with 2 turns activity did not occur: Safety/medical concerns         Walk 150 feet activity   Assist Walk 150 feet activity did not occur: Safety/medical concerns         Walk 10 feet on uneven surface  activity   Assist Walk 10 feet on uneven surfaces activity did not occur: Safety/medical concerns   Assist level: Minimal Assistance - Patient > 75% Assistive device: Walker-rolling   Wheelchair     Assist Is the patient using a wheelchair?: Yes Type of Wheelchair: Manual Wheelchair activity did not occur: Safety/medical concerns         Wheelchair 50 feet with 2 turns activity    Assist    Wheelchair 50 feet with 2 turns activity did not occur: Safety/medical concerns       Wheelchair 150 feet activity     Assist  Wheelchair 150 feet activity did not occur: Safety/medical concerns       Blood pressure (!) 146/53, pulse 69, temperature 98.4 F (36.9 C), temperature source Oral, resp. rate 18, height 5' 2 (1.575 m), weight 65.2 kg, SpO2 99%.  Medical Problem List and Plan: 1. Functional deficits secondary to left thalamic capsular infarct             -patient may shower             -ELOS/Goals: 10-14 days S - 10/9             -Stable to continue CIR  - 9/30: Lives with husband who cannot provide assist; supervision goals currently, setup for UBD and Mod-Max LBD. Activity tolerance remains poor as well as balance. Min A goals and CGA for transfers goals. Mod I goals SLP. Per son not far off her baseline for cog. Dys 1 diet downgrade  -  apparently blended all her foods at home.  10/7: SPV bed mobility, transfers CGA. Hand weakness limiting. CGA for toiletting, Min A ADLs. D1 diet with thins - eating better. Mild expressive aphasia,  SPV.    2.  Antithrombotics: -DVT/anticoagulation:  Mechanical: Sequential compression devices, below knee Bilateral lower extremities Pharmaceutical: Eliquis              -antiplatelet therapy: Eliquis  2.5 mg twice daily--tolerating  3. Pain Management: Tylenol  as needed   - 9/30: Start gabapentin 100 mg TID for LE neuropathy, pain  10-1: Ongoing severe back, head pain overnight.  Scheduled Tylenol  1000 mg 3 times daily.  10-2: DC gabapentin due to cognitive changes.  Add Voltaren gel to bilateral knees  10-6: Add Robaxin  500 mg every 8 hours as needed for muscle cramps--not using  4. Mood/Behavior/Sleep: LCSW to follow for  evaluation and support when available.              -antipsychotic agents: [name]             - Continue melatonin 3 mg as needed at bedtime, also has trazodone  PRN 5. Neuropsych/cognition: This patient is capable of making decisions on her own behalf. 6. Skin/Wound Care: Routine pressure relief measures   - 9/30: R anterior skin tear from fall ; eucerin daily for dry skin  7. Fluids/Electrolytes/Nutrition: Monitor I&O Daily weights with follow-up labs. Continue ensure. Continue vitamins/supplements.              - SLP following; on DYS 3-- now DYS 2 - 9/26: Poor p.o. intakes for several months per family due to lack of appetite since finishing an antibiotic.  Nutrition consulted for recommendations, will start mirtazapine  7.5 mg nightly for appetite. - 9/29: P.o. intakes remain poor.  Start Megace 200 mg twice daily. 9-30: Downgraded to dysphagia 1 diet due to blending foods at home.  Monitor intakes on Megace.--Somewhat improved 10-1 10/2: Patient encouraged to drink at least 6 cups of water per day.  P.o. intakes are some weight better since starting Megace.  Repeat BMP in a.m.; may need to start IV fluids if no improvements 10/6: To get 1 L IV fluid at 75 mL/h today, discussed minimal p.o. fluid intakes and need to significantly increase these. 10/7: Continue IVF at 75  cc/hr; discussed increased fluid intakes- she thinks she drak 2-3 cups only yesterday     8.  A-fib: History of noncompliance with Eliquis .              - Resumed Eliquis              - continue Metoprolol  25 mg nightly for rate control, currently stable - 9/25: A-fib with RVR into the 150s with mobilization this a.m.  Got additional dose of metoprolol  this a.m. for coverage.  Cardiology consulted, appreciate recommendations 9-26: Cardiology switched from long-acting metoprolol  to Lopressor  25 mg twice daily; patient is tolerating this much better.  DC IV fluids. -11/11/23 rate controlled, doing well on this regimen 10-1: Some rate variations overnight, staying between 55 and 101.  Overall stay within parameters. 10-3: Heart rate staying within normal parameters. 10/6: Intermittent A-fib per PT, rate controlled.    11/21/2023    5:20 AM 11/20/2023    8:29 PM 11/20/2023    8:20 PM  Vitals with BMI  Weight 143 lbs 12 oz    BMI 26.28    Systolic 146  144  Diastolic 53  48  Pulse 69 75 68      9.  HTN/HFpEF: continue hydralazine  25 mg twice daily and metoprolol              - Monitor blood pressures with increased activity - 9-25: Hypotensive into the 80s with mobilization; hold hydralazine , continue metoprolol  for rate control, will give 1 L IV fluid today.  Cardiology consult pending. - 9/26: Blood pressure much better today, 140s over 50s.  Daily weight stable. DC IV fluids, monitor.    -9/27-28/25 BPs improving, wt stable, monitor.   10-1: Blood pressure stable 120s to 130s, 50s diastolic.  Continue current regimen.  Monitor.  10-2: Some erroneous blood pressure readings this morning due to being done on left limb (prior known vascular issue per family); Place limb restriction, normotensive on right arm.   10-3: Weights uptrending with PO, however some increased edema in the lower extremities.  Will add Lasix  20 mg daily for gentle diuresis.  Notable, was on Lasix  20 mg twice daily prior  to admission; was DC'd initially due to AKI and hypovolemia. -11/18/23 edema better today, wt not done; Cr higher yesterday, will decrease lasix  to 10mg  daily starting tomorrow -11/19/23 wt down, labs tomorrow to check on Cr. Edema stable/improved on exam.  10-6: IV fluid today as above.  Peripheral edema stable.  DC Lasix .   10/7: IVF as above; will get CXR to assist in volume status eval  Filed Weights   11/19/23 0332 11/20/23 0513 11/21/23 0520  Weight: 66.7 kg 64.7 kg 65.2 kg    10.  HLD: Crestor  transition to Lipitor  80 mg   11.  AKI: Baseline creatinine 1.2-1.5. Required IV fluids.  Currently 1.51    - 9-25: Slow improvement, creatinine 1.4.  IV fluids today as above.  - 9/29: P.o. intakes remain poor, BUN/creatinine up slightly, advised patient to increase p.o. intakes.  Repeat in AM.  9-30: BUN/creatinine improved.  10/2: Creatinine up slightly today.  Patient to work on increased p.o. intakes.  Repeat BMP in AM.  10-3: BMP pending today.  Weights and peripheral edema increases above, starting Lasix  20 mg daily.  11/18/23 Cr slightly higher 1.7 so will decrease lasix  to 10mg  daily starting tomorrow-- recheck labs 10/6  10/8 Cr 1.88; DC lasix ; 1 L NS at 75/hr today, recheck in AM  10/9 Cr 1.9; no obvious medication contributors, continue IVF, repeat in AM  12.  COPD:  continue Breztri , DuoNeb as needed             - Incentive spirometer   13. Constipation: last BM 9/22, add senna HS  - 9/25: add daily miralax    - 9/26: Give sorbitol  today -11/11/23 multiple BMs with sorbitol  (would be cautious in the future), cont regimen but monitor if still having BMs too often after clean out. -9/30 LBM small--Will increase MiraLAX  to twice daily and Senokot to 2 tablets nightly -11/19/23 LBM this morning. Monitor   14.  Anemia of chronic disease/nosebleeds.  Add iron  studies, FOBT today.  Add Protonix  40 mg twice daily AC for GI protection.  Repeat H&H in AM.  - Overdue for EPO, which she gets  as outpatient, administered 9-29  - On supplementation with iron , vitamin C, B12, and folic acid  - 0-69: Hemoglobin stable, 8.1.  FOBT pending.  Iron  studies significant for anemia chronic disease  - 10-1: FOBT negative.  Hemoglobin remained stable, repeat labs tomorrow a.m.  10/2: stable 10-3: Prolonged nosebleed this a.m., self resolved.  Already on saline nose sprays 3 times daily, will add Afrin as needed. -11/18/23 nursing called me to the room for coughing up blood-- but nosebleed on exam with blood in posterior OP, seems to be resolving, asked nursing to please use afrin when these occur. Giving it now. Pt states she's feeling fine now. Advised nursing to call if it doesn't resolve.  -11/19/23 no further epistaxis reported. Monitor.     LOS: 13 days A FACE TO FACE EVALUATION WAS PERFORMED  Joesph JAYSON Likes 11/21/2023, 8:59 AM

## 2023-11-21 NOTE — Progress Notes (Signed)
 Speech Language Pathology Daily Session Note  Patient Details  Name: Kyla Duffy MRN: [id number] Date of Birth: Nov 11, 1933  Today's Date: 11/21/2023 SLP Individual Time: 1100-1200 SLP Individual Time Calculation (min): 60 min  Short Term Goals: Week 2: SLP Short Term Goal 1 (Week 2): STGs = LTGs d/t ELOS  Skilled Therapeutic Interventions:   Pt greeted at bedside. She was awake/alert in her WC upon SLP arrival, agreeable to tx tasks targeting cognition. She was able to [place] her calendar to ID the current date and recalled her upcoming d/c date on the 9th. She benefited from supervisionA to recall grad day (tomorrow) and details re d/c process. She was also able to recall personal details re this SLP w/ s cues. SLP then facilitated written directions task. She initially required minA for information processing d/t complexity of task, however, success improved to supervisionA w/ more functional directions. She did benefit from minA for working memory throughout task, but demonstrated adequate sustained attention and problem solving throughout. She was then challenged to a word finding task of mild to moderate specificity. She benefited from [facility] for moderately complex trials and only supervision for mildly complex trials. Her son reported increased success this date as compared to end of last week, stating I think she's getting it back. SLP also provided education re functional tasks to complete in the home environment and will provide pt w/ structured tasks to take home. At the end of tx tasks, she was left in her [facility] w/ the call light within reach. Recommend cont ST per POC.    Pain  None reported  Therapy/Group: Individual Therapy  [name] 11/21/2023, 1:52 PM

## 2023-11-21 NOTE — Progress Notes (Signed)
 Occupational Therapy Session Note  Patient Details  Name: Courtney Cline MRN: [id number] Date of Birth: Apr 30, 1933  Today's Date: 11/21/2023 OT Individual Time: 9164-9084 OT Individual Time Calculation (min): 40 min   Today's Date: 11/21/2023 OT Individual Time: 8494-8463 OT Individual Time Calculation (min): 31 min   Short Term Goals: Week 2:  OT Short Term Goal 1 (Week 2): STGs=LTGs due to patient's estimated length of stay.  Skilled Therapeutic Interventions/Progress Updates:   Session 1: Pt greeted resting in bed, reports of chronic BLE pain, pre-medicated. Received on 4L O2 [place], tolerates titration to 3L O2 [place]. Bed mobility with supervision, sit<>stands with CGA + RW, ambulatory toilet transfers (BSC ~10 ft away from bed) in similar fashion. Pt with continent bladder void, CGA for 3/3 toileting tasks, cuing to avoid cleaning periarea back-to-front. Pt with x1 posterior LOB after prolong standing during the above care, Min A for descent back onto [facility]. Re-educated on use of reacher for unthreading/threading LB garments, Mod verbal cuing provided for use of AD alongside Min A, pt continues to report increased hand pain with use of reacher trigger. Standing hike with CGA.Sink-side grooming with setup A. Pt remained sitting in WC, placed back on 4L O2 [place], all immediate needs met.   Session 2: Pt greeted sitting in [facility] with continued reports of BLE pain, rest provided as needed. Pt dependently transported from room<>day room. Pt completes 3x1 min cycles of Kinetron modality for BLE strengthening and endurance required for functional transfers and standing ADLs. Concurrently, OT discusses current LOF with son in preparation for upcoming discharge. Emphasis placed on CGA level for functional transfers and Min A for LB dressing. Pt returns to bed with supervision, remained resting in bed with all immediate needs met.   Therapy Documentation Precautions:  Precautions Precautions:  Fall Recall of Precautions/Restrictions: Impaired Precaution/Restrictions Comments: Watch HR and BP; 4L O2 [place] Restrictions Weight Bearing Restrictions Per Provider Order: No   Therapy/Group: Individual Therapy  [name], OTR/L, MSOT  11/21/2023, 7:52 AM

## 2023-11-21 NOTE — Progress Notes (Signed)
 Patient ID: Courtney Cline, female   DOB: 06/06/1933, 89 y.o.   MRN: [id number]  SW went by pt room several times to give updates, but pt in therapy. SW will follow-up.  Graeme Jude, MSW, LCSW Office: [phone] Cell: [phone] Fax: [phone]

## 2023-11-21 NOTE — Progress Notes (Signed)
 Physical Therapy Session Note  Patient Details  Name: Courtney Cline MRN: [id number] Date of Birth: [birth year]  Today's Date: 11/21/2023 PT Individual Time: 0950-1030 + 1348-1415 PT Individual Time Calculation (min): 40 min + 27 min  Short Term Goals: Week 2:  PT Short Term Goal 1 (Week 2): STG = LTG due to ELOS  Skilled Therapeutic Interventions/Progress Updates:    SESSION 1: Pt presents in room in [facility], agreeable to PT. Pt reporting pain in RLE. Session focused on therapeutic activities for upright tolerance and NMR for dynamic standing balance, BLE muscle fiber recruitment needed for functional transfers and gait. Pt completes sit to stands throughout session with CGA with RW. Pt ambulates to/from gym 2x150' with RW CGA and 2nd person WC follow for fatigue however pt able to ambulate to gym to sit on mat. Pt requires extended seated rest break on EOM, during which therapist redressed pt RLE wound due to gauze falling off. Pt the completes NMR for dynamic standing balance, BLE muscle fiber recruitment including step taps x20 alternating BLE 6 step, step ups 6 step x2 BLE (min assist). Pt returns to room ambulating with RW and remains seated in [facility] with all needs within reach, cal light in place and pt son present to assist with transfers at end of session.   SESSION 2: Pt presents in room in [facility], agreeable to PT. Pt does not report pain during session. Session focused on therapeutic activities for navigating household environments while managing O2 line as well as facilitating independence and safety with self care tasks, and gait training for stair negotiation. Pt completes sit to stand transfers to RW with CGA throughout session. Pt completes gait with RW CGA ~20' from [facility] to bathroom and able to manage clothing with CGA for postural stability. Pt continent of urine and BM, charted. Pt stands to manage clothing, requires CGA to prevent posterior LOB, pt then able to ambulate back to [facility] with  CGA however one LOB while turning to sit requiring min assist to correct. Pt son educated on needing to assist pt with balance while pt manages clothing at home with pt son verbalizing understanding. Pt then trnasported to main gym and positioned in front of stairs. Pt completes up/down 4 steps with pt son providing CGA/min assist, cues provided by therapist for appropriate guarding technique as well as cues for foot placement. Pt takes seated rest break and then completes 2nd trial 4 steps with therapist providing CGA/light min assist, improved postural stbaility and foot placement for second trial. Pt returns to room and remains seated in [facility] with all needs within reach, cal light in place and chair alarm donned and activated at end of session.    Therapy Documentation Precautions:  Precautions Precautions: Fall Recall of Precautions/Restrictions: Impaired Precaution/Restrictions Comments: Watch HR and BP; 4L O2 [place] Restrictions Weight Bearing Restrictions Per Provider Order: No   Therapy/Group: Individual Therapy  Reche Ohara PT, DPT 11/21/2023, 12:57 PM

## 2023-11-21 NOTE — Progress Notes (Signed)
 Nutrition Follow Up  DOCUMENTATION CODES:   Non-severe (moderate) malnutrition in context of chronic illness  INTERVENTION:   Continue Ensure Plus High Protein po TID, each supplement provides 350 kcal and 20 grams of protein  Encouraged trying to continue to increase intake at each meal to help meet calorie and protein needs  MVI with minerals daily  Provided Ensure coupons to pt for preparation of discharge  NUTRITION DIAGNOSIS:   Moderate Malnutrition related to chronic illness as evidenced by mild fat depletion, moderate fat depletion, mild muscle depletion, moderate muscle depletion, percent weight loss. Remains applicable  GOAL:   Patient will meet greater than or equal to 90% of their needs Progressing  MONITOR:   PO intake, Supplement acceptance  REASON FOR ASSESSMENT:   Consult Assessment of nutrition requirement/status, Poor PO  ASSESSMENT:   89 yo female admitted with functional deficits secondary to left thalamic capsular infarct. PMH includes HTN, HLD, HF, persistent A fib, GERD, COPD on 4 L oxygen  at baseline, osteopenia, vitamin D  deficiency, breast cancer.  9/24 admitted to CIR 9/25 DYS 2 diet w/ thins 9/29 downgraded to DYS 1 w/ thins (pt blends food at home at baseline)  Pt reports appetite has significantly improved since last assessment. Diet summary documentation shows pt consuming on average 77% of last 8 recorded meals. Pt endorses drinking 1-2 Ensures per day but states she feels like it can be a lot of fluid to intake. Pt needed IV hydration today and discussed need for increasing fluid intake throughout the day. Pt reports energy levels have picked up as well and she is progressing well with therapies. Will continue to monitor intake and progress.  Average Meal Completion: 9/29-10/1: 28% average intake x 8 recorded meals 10/4-10/7: 77% average intake x 8 recorded meals  Medications: Ferrous fumarate Vitamin C 250 mg daily Vitamin B12 50  mcg daily Folic acid 1 mg daily Megace BID Mirtazapine   MVI w/ minerals Protonix  Miralax  Senna  Labs reviewed Potassium 5.0<--5.2 BUN 49/Cr 1.97   Diet Order:   Diet Order             DIET - DYS 1 Room service appropriate? Yes; Fluid consistency: Thin  Diet effective now                   EDUCATION NEEDS:   Education needs have been addressed  Skin:  Skin Assessment: Reviewed RN Assessment  Last BM:  10/6 type 4  Height:   Ht Readings from Last 1 Encounters:  11/08/23 5' 2 (1.575 m)    Weight:   Wt Readings from Last 1 Encounters:  11/21/23 65.2 kg    Ideal Body Weight:  50 kg  BMI:  Body mass index is 26.29 kg/m.  Estimated Nutritional Needs:   Kcal:  1500-1700  Protein:  75-95g  Fluid:  1.5-1.7L    Josette Glance, MS, RDN, LDN Clinical Dietitian I Please reach out via secure chat

## 2023-11-22 ENCOUNTER — Telehealth (HOSPITAL_COMMUNITY): Payer: Self-pay | Admitting: Pharmacy Technician

## 2023-11-22 ENCOUNTER — Other Ambulatory Visit (HOSPITAL_COMMUNITY): Payer: Self-pay

## 2023-11-22 ENCOUNTER — Encounter: Payer: Self-pay | Admitting: Hematology

## 2023-11-22 LAB — BASIC METABOLIC PANEL WITH GFR
Anion gap: 7 (ref 5–15)
BUN: 42 mg/dL — ABNORMAL HIGH (ref 8–23)
CO2: 24 mmol/L (ref 22–32)
Calcium: 8.2 mg/dL — ABNORMAL LOW (ref 8.9–10.3)
Chloride: 111 mmol/L (ref 98–111)
Creatinine, Ser: 1.79 mg/dL — ABNORMAL HIGH (ref 0.44–1.00)
GFR, Estimated: 27 mL/min — ABNORMAL LOW (ref >60)
Glucose, Bld: 100 mg/dL — ABNORMAL HIGH (ref 70–99)
Potassium: 4.9 mmol/L (ref 3.5–5.1)
Sodium: 142 mmol/L (ref 135–145)

## 2023-11-22 LAB — MAGNESIUM: Magnesium: 2.6 mg/dL — ABNORMAL HIGH (ref 1.7–2.4)

## 2023-11-22 MED ORDER — METOPROLOL TARTRATE 25 MG PO TABS
25.0000 mg | ORAL_TABLET | Freq: Two times a day (BID) | ORAL | 0 refills | Status: DC
  Filled 2023-11-22: qty 60, 30d supply, fill #0

## 2023-11-22 MED ORDER — FUROSEMIDE 20 MG PO TABS
20.0000 mg | ORAL_TABLET | Freq: Every day | ORAL | Status: DC
  Administered 2023-11-22 – 2023-11-23 (×2): 20 mg via ORAL
  Filled 2023-11-22 (×2): qty 1

## 2023-11-22 MED ORDER — MEGESTROL ACETATE 400 MG/10ML PO SUSP
200.0000 mg | Freq: Two times a day (BID) | ORAL | 0 refills | Status: AC
  Filled 2023-11-22: qty 240, 24d supply, fill #0

## 2023-11-22 MED ORDER — MIRTAZAPINE 7.5 MG PO TABS
7.5000 mg | ORAL_TABLET | Freq: Every day | ORAL | 0 refills | Status: AC
  Filled 2023-11-22: qty 30, 30d supply, fill #0

## 2023-11-22 MED ORDER — APIXABAN 2.5 MG PO TABS
2.5000 mg | ORAL_TABLET | Freq: Two times a day (BID) | ORAL | 0 refills | Status: AC
Start: 2023-11-22 — End: ?
  Filled 2023-11-22: qty 60, 30d supply, fill #0

## 2023-11-22 MED ORDER — ACETAMINOPHEN 500 MG PO TABS: 1000.0000 mg | ORAL_TABLET | Freq: Three times a day (TID) | ORAL | 0 refills | Status: AC

## 2023-11-22 MED ORDER — LIDOCAINE 5 % EX PTCH: 2.0000 | MEDICATED_PATCH | CUTANEOUS | Status: DC

## 2023-11-22 MED ORDER — OXYMETAZOLINE HCL 0.05 % NA SOLN
1.0000 | Freq: Two times a day (BID) | NASAL | 0 refills | Status: AC | PRN
  Filled 2023-11-22: qty 30, 150d supply, fill #0

## 2023-11-22 MED ORDER — ADULT MULTIVITAMIN W/MINERALS CH
1.0000 | ORAL_TABLET | Freq: Every day | ORAL | 0 refills | Status: AC
  Filled 2023-11-22: qty 100, 100d supply, fill #0

## 2023-11-22 MED ORDER — FOLIC ACID 1 MG PO TABS
1.0000 mg | ORAL_TABLET | Freq: Every day | ORAL | 0 refills | Status: AC
  Filled 2023-11-22: qty 23, 23d supply, fill #0

## 2023-11-22 MED ORDER — FERROUS FUMARATE 324 (106 FE) MG PO TABS
1.0000 | ORAL_TABLET | Freq: Every day | ORAL | 0 refills | Status: AC
  Filled 2023-11-22: qty 30, 30d supply, fill #0

## 2023-11-22 MED ORDER — ATORVASTATIN CALCIUM 80 MG PO TABS
80.0000 mg | ORAL_TABLET | Freq: Every day | ORAL | 0 refills | Status: AC
  Filled 2023-11-22: qty 30, 30d supply, fill #0

## 2023-11-22 MED ORDER — LIDOCAINE 5 % EX PTCH
2.0000 | MEDICATED_PATCH | CUTANEOUS | 0 refills | Status: DC
  Filled 2023-11-22: qty 30, 15d supply, fill #0

## 2023-11-22 MED ORDER — PANTOPRAZOLE SODIUM 40 MG PO TBEC
40.0000 mg | DELAYED_RELEASE_TABLET | Freq: Two times a day (BID) | ORAL | 0 refills | Status: AC
  Filled 2023-11-22: qty 60, 30d supply, fill #0

## 2023-11-22 MED ORDER — FUROSEMIDE 20 MG PO TABS
20.0000 mg | ORAL_TABLET | Freq: Every day | ORAL | 0 refills | Status: AC
  Filled 2023-11-22: qty 30, 30d supply, fill #0

## 2023-11-22 NOTE — Progress Notes (Signed)
 Occupational Therapy Discharge Summary  Patient Details  Name: Courtney Cline MRN: [id number] Date of Birth: Aug 03, 1933  Date of Discharge from OT service:November 22, 2023   Patient has met 7 of 7 long term goals due to improved activity tolerance, improved balance, ability to compensate for deficits, functional use of  RIGHT upper and RIGHT lower extremity, improved awareness, and improved coordination.  Patient to discharge at overall Supervision level.  Patient's care partner is independent to provide the necessary physical and cognitive assistance at discharge.    Reasons goals not met: NA  Recommendation:  Patient will benefit from ongoing skilled OT services in home health setting to continue to advance functional skills in the area of BADL and Reduce care partner burden.  Equipment: BSC & RW  Reasons for discharge: treatment goals met and discharge from hospital  Patient/family agrees with progress made and goals achieved: Yes  OT Discharge Precautions/Restrictions  Precautions Precautions: Fall Recall of Precautions/Restrictions: Impaired Precaution/Restrictions Comments: 4L O2 [place]; R-sided weakness Restrictions Weight Bearing Restrictions Per Provider Order: No Pain Pain Assessment Pain Scale: 0-10 Pain Score: 0-No pain ADL ADL Eating: Set up Where Assessed-Eating: Wheelchair Grooming: Setup Where Assessed-Grooming: Sitting at sink Upper Body Bathing: Setup Where Assessed-Upper Body Bathing: Sitting at sink Lower Body Bathing: Minimal assistance Where Assessed-Lower Body Bathing: Standing at sink, Sitting at sink Upper Body Dressing: Setup Where Assessed-Upper Body Dressing: Sitting at sink Lower Body Dressing: Minimal assistance Where Assessed-Lower Body Dressing: Standing at sink, Sitting at sink Toileting: Supervision/safety Where Assessed-Toileting: Bedside Commode Toilet Transfer: Close supervision Toilet Transfer Method: Public house manager: Animator Transfer: Not assessed Film/video editor: Not assessed ADL Comments: Walk-in/Tub shower transfers not assessed due to baseline sponge-bathing. Vision Baseline Vision/History: 1 Wears glasses (Readers) Patient Visual Report: No change from baseline Perception  Perception: Impaired Perception-Other Comments: Slight R-lateral lean with fatigue. Praxis Praxis: WFL Cognition Cognition Overall Cognitive Status: Impaired/Different from baseline Arousal/Alertness: Awake/alert Memory: Impaired Memory Impairment: Decreased recall of new information;Storage deficit Awareness: Impaired Awareness Impairment: Anticipatory impairment Problem Solving: Impaired Problem Solving Impairment: Functional complex Executive Function: Organizing Organizing: Impaired Safety/Judgment: Appears intact Brief Interview for Mental Status (BIMS) Repetition of Three Words (First Attempt): 3 Temporal Orientation: Year: Correct Temporal Orientation: Month: Accurate within 5 days Temporal Orientation: Day: Correct Recall: Sock: No, could not recall Recall: Blue: No, could not recall Recall: Bed: Yes, after cueing (a piece of furniture) BIMS Summary Score: 10 Sensation Sensation Light Touch: Appears Intact Coordination Gross Motor Movements are Fluid and Coordinated: No Fine Motor Movements are Fluid and Coordinated: Yes Coordination and Movement Description: Deficits due to generalized weakness/debility and decreased activity tolerance. Motor  Motor Motor: Abnormal postural alignment and control Motor - Discharge Observations: Deficits due to generalized weakness/debility and decreased activity tolerance. Mobility  Transfers Sit to Stand: Supervision/Verbal cueing Stand to Sit: Supervision/Verbal cueing  Trunk/Postural Assessment  Cervical Assessment Cervical Assessment: Exceptions to [facility] (forward head) Thoracic Assessment Thoracic  Assessment: Exceptions to [facility] (kyphotic) Lumbar Assessment Lumbar Assessment: Exceptions to [facility] (posterior pelvic tilt.) Postural Control Postural Control: Deficits on evaluation Trunk Control: R lateral/posterior lean with fatigue Righting Reactions: Decreased/delayed Protective Responses: Decreased/delayed  Balance Balance Balance Assessed: Yes Static Sitting Balance Static Sitting - Balance Support: Feet supported Static Sitting - Level of Assistance: 5: Stand by assistance (SUP) Dynamic Sitting Balance Dynamic Sitting - Balance Support: During functional activity Dynamic Sitting - Level of Assistance: 5: Stand by assistance (SUP) Static Standing Balance Static Standing - Balance  Support: During functional activity;No upper extremity supported Static Standing - Level of Assistance: 5: Stand by assistance (SUP) Dynamic Standing Balance Dynamic Standing - Balance Support: During functional activity;Bilateral upper extremity supported Dynamic Standing - Level of Assistance: 5: Stand by assistance (SUP) Extremity/Trunk Assessment RUE Assessment RUE Assessment: Exceptions to [facility] Active Range of Motion (AROM) Comments: WFL General Strength Comments: 3+/5 LUE Assessment LUE Assessment: Exceptions to [facility] Active Range of Motion (AROM) Comments: [facility] General Strength Comments: 3+/5   Courtney Cline, OTR/L, MSOT  11/22/2023, 3:44 PM

## 2023-11-22 NOTE — Progress Notes (Signed)
 Inpatient Rehabilitation Discharge Medication Review by a Pharmacist  A complete drug regimen review was completed for this patient to identify any potential clinically significant medication issues.  High Risk Drug Classes Is patient taking? Indication by Medication  Antipsychotic No   Anticoagulant Yes Apixaban  - A.fib  Antibiotic No   Opioid No   Antiplatelet No   Hypoglycemics/insulin No   Vasoactive Medication Yes Metoprolol  - HTN Lasix  - HF  Chemotherapy No   Other Yes Tylenol , Lidoderm  - pain Megace - appetite stimulant Remeron  - MDD Pantoprazole  - GERD Trelegy Elipta and Albuterol  - COPD Atorvastatin  - HLD Hemocyte, folic acid, MVI, Vit B12, Vit D - supplementation     Type of Medication Issue Identified Description of Issue Recommendation(s)  Drug Interaction(s) (clinically significant)     Duplicate Therapy     Allergy     No Medication Administration End Date     Incorrect Dose     Additional Drug Therapy Needed     Significant med changes from prior encounter (inform family/care partners about these prior to discharge).    Other       Clinically significant medication issues were identified that warrant physician communication and completion of prescribed/recommended actions by midnight of the next day:  No  Name of provider notified for urgent issues identified:   Provider Method of Notification:     Pharmacist comments:   Time spent performing this drug regimen review (minutes):  20   Dorothyann DELENA Alert 11/22/2023 1:36 PM

## 2023-11-22 NOTE — Plan of Care (Signed)
  Problem: Sit to Stand Goal: LTG:  Patient will perform sit to stand with assistance level (PT) Description: LTG:  Patient will perform sit to stand with assistance level (PT) Outcome: Adequate for Discharge   Problem: RH Bed to Chair Transfers Goal: LTG Patient will perform bed/chair transfers w/assist (PT) Description: LTG: Patient will perform bed to chair transfers with assistance (PT). Outcome: Adequate for Discharge   Problem: RH Furniture Transfers Goal: LTG Patient will perform furniture transfers w/assist (OT/PT) Description: LTG: Patient will perform furniture transfers  with assistance (OT/PT). Outcome: Adequate for Discharge   Problem: RH Ambulation Goal: LTG Patient will ambulate in controlled environment (PT) Description: LTG: Patient will ambulate in a controlled environment, # of feet with assistance (PT). Outcome: Adequate for Discharge   Problem: RH Stairs Goal: LTG Patient will ambulate up and down stairs w/assist (PT) Description: LTG: Patient will ambulate up and down # of stairs with assistance (PT) Outcome: Adequate for Discharge Flowsheets (Taken 11/22/2023 1624) LTG: Pt will ambulate up/down stairs assist needed:: Contact Guard/Touching assist   Problem: RH Balance Goal: LTG Patient will maintain dynamic sitting balance (PT) Description: LTG:  Patient will maintain dynamic sitting balance with assistance during mobility activities (PT) Outcome: Completed/Met Goal: LTG Patient will maintain dynamic standing balance (PT) Description: LTG:  Patient will maintain dynamic standing balance with assistance during mobility activities (PT) Outcome: Completed/Met   Problem: RH Bed Mobility Goal: LTG Patient will perform bed mobility with assist (PT) Description: LTG: Patient will perform bed mobility with assistance, with/without cues (PT). Outcome: Completed/Met   Problem: RH Car Transfers Goal: LTG Patient will perform car transfers with assist  (PT) Description: LTG: Patient will perform car transfers with assistance (PT). Outcome: Completed/Met   Problem: RH Ambulation Goal: LTG Patient will ambulate in home environment (PT) Description: LTG: Patient will ambulate in home environment, # of feet with assistance (PT). Outcome: Completed/Met   Problem: RH Wheelchair Mobility Goal: LTG Patient will propel w/c in controlled environment (PT) Description: LTG: Patient will propel wheelchair in controlled environment, # of feet with assist (PT) Outcome: Not Applicable Note: Discontinued due to pt meeting ambulatory goals Goal: LTG Patient will propel w/c in home environment (PT) Description: LTG: Patient will propel wheelchair in home environment, # of feet with assistance (PT). Outcome: Not Applicable Note: Discontinued due to pt meeting ambulatory goals

## 2023-11-22 NOTE — Progress Notes (Signed)
 Occupational Therapy Session Note  Patient Details  Name: Courtney Cline MRN: [id number] Date of Birth: 01-06-1934  Today's Date: 11/22/2023 OT Individual Time: 1020-1104 OT Individual Time Calculation (min): 44 min   Today's Date: 11/22/2023 OT Individual Time: 1420-1520 OT Individual Time Calculation (min): 60 min   Short Term Goals: Week 2:  OT Short Term Goal 1 (Week 2): STGs=LTGs due to patient's estimated length of stay.  Skilled Therapeutic Interventions/Progress Updates:   Session 1:  Pt greeted sitting in WC, no reports of pain, son present during session for ongoing caregiver education. Pt dependently transported from room<>ADL apartment for energy conservation. In ADL apartment, furniture transfer performed as patient sleeps on couch. Education provided on use of arm-rest for UE support during transfers in case home couch is more compliant than that in ADL apartment. Pt performs transfers with CGA progressing to supervision + RW. Short ambulatory transfer from couch>BSC, placed at distance to simulate home environment, supervision provided. Education provided on the importance of mobility at least x1/hour to maintain activity tolerance/endurance. Handout provided for non-rinse sprays/wipes and wedge pillow. Plan to complete sponge-bathing during PM session. Pt remained sitting in [facility] with all immediate needs met, son at side.   Session 2:  Pt greeted sitting in WC, reports of continued BLE pain, pre-medicated, rest provided as needed. Session with focus on sponge-bathing routine with son intermediately present for carryover into transition home. Patient completes LB bathing with assistance to thouroughly care for B-feet and close supervision for standing balance during pericare. Continent B/B void in [facility] with setup of damp wash-cloths for care. Min A provided for UB dressing due to generalized fatigue/SOB. Son assists with stand-step transfer from WC>EOB at [facility] + RW. Bed  mobility with supervision. Pt remained resting in bed with all immediate needs met, son at bedside.   Therapy Documentation Precautions:  Precautions Precautions: Fall Recall of Precautions/Restrictions: Impaired Precaution/Restrictions Comments: Watch HR and BP; 4L O2 [place] Restrictions Weight Bearing Restrictions Per Provider Order: No   Therapy/Group: Individual Therapy  [name], OTR/L, MSOT  11/22/2023, 7:55 AM

## 2023-11-22 NOTE — Progress Notes (Signed)
 Physical Therapy Discharge Summary  Patient Details  Name: Courtney Cline MRN: [id number] Date of Birth: Dec 16, 1933  Date of Discharge from PT service:November 22, 2023  Today's Date: 11/22/2023 PT Individual Time: 0909-0949 PT Individual Time Calculation (min): 40 min    Patient has met 5 of 9 long term goals due to improved activity tolerance, improved balance, improved postural control, increased strength, improved attention, improved awareness, and improved coordination.  Patient to discharge at an ambulatory level Supervision/CGA.   Patient's care partner is independent to provide the necessary physical and cognitive assistance at discharge.  Reasons goals not met: Pt continues to require supervision/CGA for transfers and ambulation due to ongoing fall risk however pt is adequate for DC with pt sons independent to assist  Recommendation:  Patient will benefit from ongoing skilled PT services in home health setting to continue to advance safe functional mobility, address ongoing impairments in dynamic standing balance, gait training, activity tolerance, and minimize fall risk.  Equipment: No equipment provided  Reasons for discharge: treatment goals met and discharge from hospital  Patient/family agrees with progress made and goals achieved: Yes  PT Discharge Skilled Treatment Interventions: Pt presents in room in bed, agreeable to PT. Pt reporting pain in RLE but not limited. Session focused on therapeutic activities to facilitate independence and safety with mobility as well as with DC planning, and NMR for HEP to complete at DC. Pt completes bed mobility modI with hospital bed features, completes transfers with close supervision with RW throughout session. Pt completes toilet transfers with CGA for clothing and periarea hygiene management. Pt continent of urine and BM, charted. Pt completes hand hygiene in standing with supervision. Pt takes seated rest break then ambulates  150' with RW, supervision for ~75' then requires CGA for subsequent 64' due to fatigue, pt takes multiple standing rest breaks with cues for breathing mechanics. Pt on 4L O2 throughout session, SpO2 99%. Pt completes NMR as HEP to complete at DC, pt instructed in safety and having someone with pt during exercises,pt completes one set of the following exercises:   Access Code: [email or web address] URL: [email or web address] Date: 11/22/2023 Prepared by: Reche Ohara  Exercises - Standing March with Counter Support  - 1 x daily - 7 x weekly - 3 sets - 20 reps - Mini Squat with Counter Support  - 1 x daily - 7 x weekly - 3 sets - 10 reps - Heel Raises with Counter Support  - 1 x daily - 7 x weekly - 3 sets - 10 reps - Standing Hamstring Curl with RW  - 1 x daily - 7 x weekly - 3 sets - 10 reps  Pt returns to room and remains seated in [facility] with all needs within reach, cal light in place and pt son at bedside at end of session.  Precautions/Restrictions Precautions Precautions: Fall Recall of Precautions/Restrictions: Impaired Precaution/Restrictions Comments: 4L O2 [place]; R-sided weakness Restrictions Weight Bearing Restrictions Per Provider Order: No Pain Interference Pain Interference Pain Effect on Sleep: 1. Rarely or not at all Pain Interference with Therapy Activities: 1. Rarely or not at all Pain Interference with Day-to-Day Activities: 1. Rarely or not at all Vision/Perception  Vision - History Ability to See in Adequate Light: 1 Impaired Perception Perception: Impaired Preception Impairment Details: Spatial orientation Perception-Other Comments: Slight R-lateral lean with fatigue. Praxis Praxis: WFL  Cognition Overall Cognitive Status: Impaired/Different from baseline Arousal/Alertness: Awake/alert Orientation Level: Oriented X4 Memory: Impaired Memory Impairment: Decreased recall of new information;Storage deficit Awareness: Impaired  Awareness Impairment: Anticipatory  impairment Problem Solving: Impaired Problem Solving Impairment: Functional complex Executive Function: Landscape architect: Impaired Safety/Judgment: Appears intact Sensation Sensation Light Touch: Appears Intact Coordination Gross Motor Movements are Fluid and Coordinated: No Fine Motor Movements are Fluid and Coordinated: Yes Coordination and Movement Description: Deficits due to generalized weakness/debility and decreased activity tolerance. Motor  Motor Motor: Abnormal postural alignment and control Motor - Discharge Observations: Deficits due to generalized weakness/debility and decreased activity tolerance.  Mobility Bed Mobility Bed Mobility: Rolling Right;Rolling Left;Left Sidelying to Sit Rolling Right: Independent with assistive device Rolling Left: Independent with assistive device Left Sidelying to Sit: Independent with assistive device Transfers Transfers: Sit to Stand;Stand to Sit;Stand Pivot Transfers Sit to Stand: Supervision/Verbal cueing Stand to Sit: Supervision/Verbal cueing Stand Pivot Transfers: Supervision/Verbal cueing Stand Pivot Transfer Details: Verbal cues for safe use of DME/AE;Verbal cues for precautions/safety Transfer (Assistive device): Rolling walker Locomotion  Gait Ambulation: Yes Gait Assistance: Supervision/Verbal cueing Gait Distance (Feet): 150 Feet Assistive device: Rolling walker Gait Assistance Details: Verbal cues for safe use of DME/AE;Verbal cues for precautions/safety;Verbal cues for technique;Verbal cues for gait pattern Gait Gait: Yes Gait Pattern: Impaired Gait Pattern: Step-through pattern;Decreased step length - right;Poor foot clearance - right;Trunk flexed Gait velocity: decreased Stairs / Additional Locomotion Stairs: Yes Stairs Assistance: Minimal Assistance - Patient > 75% Stair Management Technique: One rail Right Number of Stairs: 4 Height of Stairs: 6 Wheelchair Mobility Wheelchair Mobility: No   Trunk/Postural Assessment  Cervical Assessment Cervical Assessment: Exceptions to [facility] (forward head) Thoracic Assessment Thoracic Assessment: Exceptions to [facility] (kyphotic) Lumbar Assessment Lumbar Assessment: Exceptions to [facility] (posterior pelvic tilt.) Postural Control Postural Control: Deficits on evaluation Trunk Control: R lateral/posterior lean with fatigue Righting Reactions: Decreased/delayed Protective Responses: Decreased/delayed  Balance Balance Balance Assessed: Yes Static Sitting Balance Static Sitting - Balance Support: Feet supported Static Sitting - Level of Assistance: 6: Modified independent (Device/Increase time) (SUP) Dynamic Sitting Balance Dynamic Sitting - Balance Support: During functional activity Dynamic Sitting - Level of Assistance: 6: Modified independent (Device/Increase time) (SUP) Static Standing Balance Static Standing - Balance Support: During functional activity;Bilateral upper extremity supported Static Standing - Level of Assistance: 5: Stand by assistance (SUP) Dynamic Standing Balance Dynamic Standing - Balance Support: During functional activity;Bilateral upper extremity supported Dynamic Standing - Level of Assistance: 5: Stand by assistance (SUP) Extremity Assessment  RUE Assessment RUE Assessment: Exceptions to [facility] Active Range of Motion (AROM) Comments: WFL General Strength Comments: 3+/5 LUE Assessment LUE Assessment: Exceptions to [facility] Active Range of Motion (AROM) Comments: WFL General Strength Comments: 3+/5 RLE Assessment RLE Assessment: Exceptions to [facility] General Strength Comments: grossly 4+/5, hip flexion 3+/5 LLE Assessment LLE Assessment: Exceptions to [facility] General Strength Comments: grossly 4+/5, hip flexion 3+/5.   Reche Ohara PT, DPT 11/22/2023, 4:38 PM

## 2023-11-22 NOTE — Progress Notes (Addendum)
 Patient ID: Courtney Cline, female   DOB: 15-Nov-1933, 89 y.o.   MRN: [id number]  SW met with pt and pt son [name] in room to review updates from team conference, and d/c date remains 10/9. SW discussed [facility] pefernece. SW will bring list. SW will order RW and 3in1 BSC. States they have a w/c at hte home for pt and husband to use and are ok at this time. Pt son will bring in oxygen  tank tomorrow for pt discharge.  SW ordered 3in1 BSC and RW with [employer] via parachute.  SW provided pt son with [facility] list and he will review and confirm with SW  in the morning.  *SW received phone call from pt son stating preferred [facility] 1) [facility] and 2) [facility]. SW will confirm.  HHPT/OT/SLP/aide referral accepted by [facility].   SW called pt son [name] to confirm [facility] is in place.  Graeme Jude, MSW, LCSW Office: [phone] Cell: [phone] Fax: [phone]

## 2023-11-22 NOTE — Progress Notes (Addendum)
 PROGRESS NOTE   Subjective/Complaints:  No acute complaints.  No events overnight. She does feel generally more SOB today.  Creatinine back down to 1.7 today; BUN improving to 42 Magnesium elevated 2.6 Vitals with some mild hypertension overnight; overall stable.  No increasing oxygen  requirements  CXR yesterday: IMPRESSION: 1. Emphysematous changes and fibrosis in the lungs. 2. Small bilateral pleural effusions with basilar atelectasis. 3. Cardiac enlargement.  Can not exclude early interstitial edema.   ROS: Denies fevers, chills, N/V, abdominal pain, constipation, diarrhea, cough, chest pain, new weakness or paraesthesias.   Poor appetite Knee pain, calf pain Epistaxis SOB Objective:   DG CHEST PORT 1 VIEW Result Date: 11/21/2023 CLINICAL DATA:  Shortness of breath EXAM: PORTABLE CHEST 1 VIEW COMPARISON:  11/04/2023 FINDINGS: Cardiac enlargement. No vascular congestion. Emphysematous changes in the lungs. Fine diffuse interstitial pattern may represent fibrosis or edema. Small bilateral pleural effusions with basilar atelectasis. No pneumothorax. Mediastinal contours appear intact. Calcification of the aorta. Degenerative changes in the spine. IMPRESSION: 1. Emphysematous changes and fibrosis in the lungs. 2. Small bilateral pleural effusions with basilar atelectasis. 3. Cardiac enlargement.  Can not exclude early interstitial edema. Electronically Signed   By: Elsie Gravely M.D.   On: 11/21/2023 20:00   Recent Labs    11/20/23 0625  WBC 7.0  HGB 8.1*  HCT 27.9*  PLT 257     Recent Labs    11/21/23 0504 11/22/23 0454  NA 144 142  K 5.0 4.9  CL 110 111  CO2 27 24  GLUCOSE 98 100*  BUN 49* 42*  CREATININE 1.97* 1.79*  CALCIUM  8.5* 8.2*     Intake/Output Summary (Last 24 hours) at 11/22/2023 0826 Last data filed at 11/22/2023 0817 Gross per 24 hour  Intake 1533.17 ml  Output --  Net 1533.17 ml         Physical Exam: Vital Signs Blood pressure (!) 133/57, pulse 69, temperature 98.3 F (36.8 C), temperature source Oral, resp. rate 18, height 5' 2 (1.575 m), weight 68.5 kg, SpO2 100%.  Constitutional: No apparent distress. Appropriate appearance for age.  Sitting upright in bedside chair. HENT: No JVD. Neck Supple. Trachea midline. Atraumatic, normocephalic. Eyes: PERRLA. EOMI. Visual fields grossly intact.  Cardiovascular: RRR, no murmurs/rub/gallops. Trace bilateral lower extremity edema. Respiratory: Globaly diminished, expiratory wheezing in bilateral lower lobes. Some SOB.  On 4 L nasal cannula  Abdomen: + bowel sounds, normoactive. No distention or tenderness. Soft.  Skin: skin tear RLE covered--stable  MSK: No apparent deformity.    + TTP bilateral knees, R calf  Neurologic exam:  Cognition: AAO to person, place, and time with some cueing + Mild memory deficits, otherwise cognition intact.  Insight: Good  insight into current condition.  Mood: Pleasant affect, appropriate mood.  Sensation: Equal and intact in BL UE and Les.  Reflexes:  Negative Hoffman's and babinski signs bilaterally.  CN: 2-12 grossly intact.  Coordination: No apparent tremors. No ataxia on FTN, HTS bilaterally.  Spasticity: MAS 0 in all extremities.  Strength: 4/5 throughout BL UE and Les  Physical exam unchanged from the above on reexamination 11/22/23    Assessment/Plan: 1. Functional deficits which require 3+  hours per day of interdisciplinary therapy in a comprehensive inpatient rehab setting. Physiatrist is providing close team supervision and 24 hour management of active medical problems listed below. Physiatrist and rehab team continue to assess barriers to discharge/monitor patient progress toward functional and medical goals  Care Tool:  Bathing  Bathing activity did not occur: Safety/medical concerns (Limited eval due to elevated/variable HR (67-135 bpm).)           Bathing assist        Upper Body Dressing/Undressing Upper body dressing Upper body dressing/undressing activity did not occur (including orthotics): Safety/medical concerns (Limited eval due to elevated/variable HR (67-135 bpm).)      Upper body assist      Lower Body Dressing/Undressing Lower body dressing    Lower body dressing activity did not occur: Safety/medical concerns (Limited eval due to elevated/variable HR (67-135 bpm).) What is the patient wearing?: Pants     Lower body assist Assist for lower body dressing: Minimal Assistance - Patient > 75%     Toileting Toileting    Toileting assist Assist for toileting: 2 Helpers     Transfers Chair/bed transfer  Transfers assist  Chair/bed transfer activity did not occur: Safety/medical concerns  Chair/bed transfer assist level: Minimal Assistance - Patient > 75% (w/ RW.)     Locomotion Ambulation   Ambulation assist   Ambulation activity did not occur: Safety/medical concerns  Assist level: Minimal Assistance - Patient > 75% Assistive device: Walker-rolling Max distance: 20   Walk 10 feet activity   Assist  Walk 10 feet activity did not occur: Safety/medical concerns  Assist level: Minimal Assistance - Patient > 75% Assistive device: Walker-rolling   Walk 50 feet activity   Assist Walk 50 feet with 2 turns activity did not occur: Safety/medical concerns         Walk 150 feet activity   Assist Walk 150 feet activity did not occur: Safety/medical concerns         Walk 10 feet on uneven surface  activity   Assist Walk 10 feet on uneven surfaces activity did not occur: Safety/medical concerns   Assist level: Minimal Assistance - Patient > 75% Assistive device: Walker-rolling   Wheelchair     Assist Is the patient using a wheelchair?: Yes Type of Wheelchair: Manual Wheelchair activity did not occur: Safety/medical concerns         Wheelchair 50 feet with 2 turns activity    Assist     Wheelchair 50 feet with 2 turns activity did not occur: Safety/medical concerns       Wheelchair 150 feet activity     Assist  Wheelchair 150 feet activity did not occur: Safety/medical concerns       Blood pressure (!) 133/57, pulse 69, temperature 98.3 F (36.8 C), temperature source Oral, resp. rate 18, height 5' 2 (1.575 m), weight 68.5 kg, SpO2 100%.  Medical Problem List and Plan: 1. Functional deficits secondary to left thalamic capsular infarct             -patient may shower             -ELOS/Goals: 10-14 days S - 10/9             -Stable to continue CIR  - 9/30: Lives with husband who cannot provide assist; supervision goals currently, setup for UBD and Mod-Max LBD. Activity tolerance remains poor as well as balance. Min A goals and CGA for transfers goals. Mod I goals SLP. Per son not  far off her baseline for cog. Dys 1 diet downgrade  -  apparently blended all her foods at home.  10/7: SPV bed mobility, transfers CGA. Hand weakness limiting. CGA for toiletting, Min A ADLs. D1 diet with thins - eating better. Mild expressive aphasia, SPV.    2.  Antithrombotics: -DVT/anticoagulation:  Mechanical: Sequential compression devices, below knee Bilateral lower extremities Pharmaceutical: Eliquis              -antiplatelet therapy: Eliquis  2.5 mg twice daily--tolerating  3. Pain Management: Tylenol  as needed   - 9/30: Start gabapentin 100 mg TID for LE neuropathy, pain  10-1: Ongoing severe back, head pain overnight.  Scheduled Tylenol  1000 mg 3 times daily.  10-2: DC gabapentin due to cognitive changes.  Add Voltaren gel to bilateral knees  10-6: Add Robaxin  500 mg every 8 hours as needed for muscle cramps--not using  4. Mood/Behavior/Sleep: LCSW to follow for evaluation and support when available.              -antipsychotic agents: [name]             - Continue melatonin 3 mg as needed at bedtime, also has trazodone  PRN 5. Neuropsych/cognition: This patient is capable of  making decisions on her own behalf. 6. Skin/Wound Care: Routine pressure relief measures   - 9/30: R anterior skin tear from fall ; eucerin daily for dry skin  7. Fluids/Electrolytes/Nutrition: Monitor I&O Daily weights with follow-up labs. Continue ensure. Continue vitamins/supplements.              - SLP following; on DYS 3-- now DYS 2 - 9/26: Poor p.o. intakes for several months per family due to lack of appetite since finishing an antibiotic.  Nutrition consulted for recommendations, will start mirtazapine  7.5 mg nightly for appetite. - 9/29: P.o. intakes remain poor.  Start Megace 200 mg twice daily. 9-30: Downgraded to dysphagia 1 diet due to blending foods at home.  Monitor intakes on Megace.--Somewhat improved 10-1 10/2: Patient encouraged to drink at least 6 cups of water per day.  P.o. intakes are some weight better since starting Megace.  Repeat BMP in a.m.; may need to start IV fluids if no improvements 10/6: To get 1 L IV fluid at 75 mL/h today, discussed minimal p.o. fluid intakes and need to significantly increase these. 10/7: Continue IVF at 75 cc/hr; discussed increased fluid intakes- she thinks she drak 2-3 cups only yesterday 10/8: DC IV fluids.  Push p.o. fluid intakes today.     8.  A-fib: History of noncompliance with Eliquis .              - Resumed Eliquis              - continue Metoprolol  25 mg nightly for rate control, currently stable - 9/25: A-fib with RVR into the 150s with mobilization this a.m.  Got additional dose of metoprolol  this a.m. for coverage.  Cardiology consulted, appreciate recommendations 9-26: Cardiology switched from long-acting metoprolol  to Lopressor  25 mg twice daily; patient is tolerating this much better.  DC IV fluids. -11/11/23 rate controlled, doing well on this regimen 10-1: Some rate variations overnight, staying between 55 and 101.  Overall stay within parameters. 10-3: Heart rate staying within normal parameters. - Intermittent A-fib ,  rate controlled.    11/22/2023    6:07 AM 11/21/2023    7:42 PM 11/21/2023    1:00 PM  Vitals with BMI  Weight 151 lbs 2 oz    BMI  27.63    Systolic 133 142 876  Diastolic 57 45 46  Pulse 69 75 60      9.  HTN/HFpEF: continue hydralazine  25 mg twice daily and metoprolol              - Monitor blood pressures with increased activity - 9-25: Hypotensive into the 80s with mobilization; hold hydralazine , continue metoprolol  for rate control, will give 1 L IV fluid today.  Cardiology consult pending. - 9/26: Blood pressure much better today, 140s over 50s.  Daily weight stable. DC IV fluids, monitor.    -9/27-28/25 BPs improving, wt stable, monitor.   10-1: Blood pressure stable 120s to 130s, 50s diastolic.  Continue current regimen.  Monitor.  10-2: Some erroneous blood pressure readings this morning due to being done on left limb (prior known vascular issue per family); Place limb restriction, normotensive on right arm.   10-3: Weights uptrending with PO, however some increased edema in the lower extremities.  Will add Lasix  20 mg daily for gentle diuresis.  Notable, was on Lasix  20 mg twice daily prior to admission; was DC'd initially due to AKI and hypovolemia. -11/18/23 edema better today, wt not done; Cr higher yesterday, will decrease lasix  to 10mg  daily starting tomorrow -11/19/23 wt down, labs tomorrow to check on Cr. Edema stable/improved on exam.  10-6: IV fluid today as above.  Peripheral edema stable.  DC Lasix .   10/7: IVF as above; will get CXR to assist in volume status eval  - 10-8: Chest x-ray with cardiac enlargement versus early interstitial edema, bilateral small pleural effusions.  Weights up.  DC IV fluids, monitor today, resume lasix  20 mg daily  Filed Weights   11/20/23 0513 11/21/23 0520 11/22/23 0607  Weight: 64.7 kg 65.2 kg 68.5 kg    10.  HLD: Crestor  transition to Lipitor  80 mg   11.  AKI: Baseline creatinine 1.2-1.5. Required IV fluids.  Currently 1.51    -  9-25: Slow improvement, creatinine 1.4.  IV fluids today as above.  - 9/29: P.o. intakes remain poor, BUN/creatinine up slightly, advised patient to increase p.o. intakes.  Repeat in AM.  9-30: BUN/creatinine improved.  10/2: Creatinine up slightly today.  Patient to work on increased p.o. intakes.  Repeat BMP in AM.  10-3: BMP pending today.  Weights and peripheral edema increases above, starting Lasix  20 mg daily.  11/18/23 Cr slightly higher 1.7 so will decrease lasix  to 10mg  daily starting tomorrow-- recheck labs 10/6  10/6 Cr 1.88; DC lasix ; 1 L NS at 75/hr today, recheck in AM  10/7 Cr 1.9; no obvious medication contributors, continue IVF, repeat in AM  10/8: Creatinine down to 1.7.  Due to concern for volume overload as above, DC IV fluids today.  Encourage p.o.'s, may need to resume Lasix  .  DC Voltaren, start lidocaine  patch  12.  COPD:  continue Breztri , DuoNeb as needed             - Incentive spirometer   13. Constipation: last BM 9/22, add senna HS  - 9/25: add daily miralax    - 9/26: Give sorbitol  today -11/11/23 multiple BMs with sorbitol  (would be cautious in the future), cont regimen but monitor if still having BMs too often after clean out. -9/30 LBM small--Will increase MiraLAX  to twice daily and Senokot to 2 tablets nightly -10/8 last bowel movement, small  14.  Anemia of chronic disease/nosebleeds.  Add iron  studies, FOBT today.  Add Protonix  40 mg twice daily AC  for GI protection.  Repeat H&H in AM.  - Overdue for EPO, which she gets as outpatient, administered 9-29  - On supplementation with iron , vitamin C, B12, and folic acid  - 0-69: Hemoglobin stable, 8.1.  FOBT pending.  Iron  studies significant for anemia chronic disease  - 10-1: FOBT negative.  Hemoglobin remained stable, repeat labs tomorrow a.m.  10/2: stable 10-3: Prolonged nosebleed this a.m., self resolved.  Already on saline nose sprays 3 times daily, will add Afrin as needed. -11/18/23 nursing called me to  the room for coughing up blood-- but nosebleed on exam with blood in posterior OP, seems to be resolving, asked nursing to please use afrin when these occur. Giving it now. Pt states she's feeling fine now. Advised nursing to call if it doesn't resolve.  -11/19/23 no further epistaxis reported. Monitor.   - CBC in a.m.    LOS: 14 days A FACE TO FACE EVALUATION WAS PERFORMED  Joesph JAYSON Likes 11/22/2023, 8:26 AM

## 2023-11-22 NOTE — Plan of Care (Signed)
  Problem: RH PAIN MANAGEMENT Goal: RH STG PAIN MANAGED AT OR BELOW PT'S PAIN GOAL Description: <4 w/ prns Outcome: Progressing   Problem: RH SAFETY Goal: RH STG ADHERE TO SAFETY PRECAUTIONS W/ASSISTANCE/DEVICE Description: STG Adhere to Safety Precautions With min- supervision Assistance/Device. Outcome: Progressing   Problem: RH KNOWLEDGE DEFICIT Goal: RH STG INCREASE KNOWLEDGE OF HYPERTENSION Description: Manage increase knowledge of hypertension with min- supervision assistance from sons using educational materials provided Outcome: Progressing

## 2023-11-22 NOTE — Progress Notes (Signed)
 Speech Language Pathology Discharge Summary  Patient Details  Name: Courtney Cline MRN: [id number] Date of Birth: 1933/08/08  Date of Discharge from SLP service:October {NUMBERS 1-31 (DATE):31396}, 2025   Patient has met 4 of 4 long term goals.  Patient to discharge at overall Supervision level.  Reasons goals not met: n/a   Clinical Impression/Discharge Summary:  Pt made good progress this stay, as evidenced by mastery of 4/4 LTGs and improved recall, problem solving, processing, and word finding. Very mild cognitive-linguistic deficits remain and family reports she is not yet back to baseline. She currently tolerates a Dys 1 diet w/ thin liquids @ modI. Per pt and family, she tolerates Dys 1-2 @ at baseline w/o difficulty. They verbalized understanding re food options and mastication limitations vs true dysphagia at this time. She would benefit from continued ST upon d/c to target remaining deficits, maximize ***  Care Partner:  Caregiver Able to Provide Assistance: Yes  Type of Caregiver Assistance: Cognitive;Physical  Recommendation:  Home Health SLP  Rationale for SLP Follow Up: Maximize cognitive function and independence;Reduce caregiver burden   Equipment: n/a   Reasons for discharge: Discharged from hospital   Patient/Family Agrees with Progress Made and Goals Achieved: Yes    Courtney Cline 11/22/2023, 8:30 AM

## 2023-11-22 NOTE — Plan of Care (Signed)
  Problem: RH Balance Goal: LTG Patient will maintain dynamic standing with ADLs (OT) Description: LTG:  Patient will maintain dynamic standing balance with assist during activities of daily living (OT)  Outcome: Completed/Met   Problem: RH Bathing Goal: LTG Patient will bathe all body parts with assist levels (OT) Description: LTG: Patient will bathe all body parts with assist levels (OT) Outcome: Completed/Met   Problem: RH Dressing Goal: LTG Patient will perform upper body dressing (OT) Description: LTG Patient will perform upper body dressing with assist, with/without cues (OT). Outcome: Completed/Met Goal: LTG Patient will perform lower body dressing w/assist (OT) Description: LTG: Patient will perform lower body dressing with assist, with/without cues in positioning using equipment (OT) Outcome: Completed/Met   Problem: RH Toileting Goal: LTG Patient will perform toileting task (3/3 steps) with assistance level (OT) Description: LTG: Patient will perform toileting task (3/3 steps) with assistance level (OT)  Outcome: Completed/Met   Problem: RH Toilet Transfers Goal: LTG Patient will perform toilet transfers w/assist (OT) Description: LTG: Patient will perform toilet transfers with assist, with/without cues using equipment (OT) Outcome: Completed/Met   Problem: RH Tub/Shower Transfers Goal: LTG Patient will perform tub/shower transfers w/assist (OT) Description: LTG: Patient will perform tub/shower transfers with assist, with/without cues using equipment (OT) Outcome: Not Applicable

## 2023-11-22 NOTE — Discharge Summary (Signed)
 Physician Discharge Summary  Patient ID: Rosi Secrist MRN: [id number] DOB/AGE: [id number] 88 y.o.  Admit date: 11/08/2023 Discharge date: 11/23/2023  Discharge Diagnoses:  Principal Problem:   CVA (cerebral vascular accident) [facility]) Active Problems:   Chronic hypoxemic respiratory failure (HCC)   Bilateral lower extremity edema   Essential hypertension   Hyperlipidemia   Anemia of chronic disease   Osteopenia with high risk of fracture   Vitamin D  deficiency   Diastolic CHF (HCC)   COPD without exacerbation (HCC)   Physical deconditioning   COPD exacerbation (HCC)   CKD stage 3b, GFR 30-44 ml/min (HCC)   Chronic heart failure with preserved ejection fraction (HCC)   Obesity (BMI 30-39.9)   Normocytic anemia   Myocardial injury   COPD with acute exacerbation (HCC)   Acute metabolic encephalopathy   Discharged Condition: stable  Significant Diagnostic Studies: DG CHEST PORT 1 VIEW Result Date: 11/21/2023 CLINICAL DATA:  Shortness of breath EXAM: PORTABLE CHEST 1 VIEW COMPARISON:  11/04/2023 FINDINGS: Cardiac enlargement. No vascular congestion. Emphysematous changes in the lungs. Fine diffuse interstitial pattern may represent fibrosis or edema. Small bilateral pleural effusions with basilar atelectasis. No pneumothorax. Mediastinal contours appear intact. Calcification of the aorta. Degenerative changes in the spine. IMPRESSION: 1. Emphysematous changes and fibrosis in the lungs. 2. Small bilateral pleural effusions with basilar atelectasis. 3. Cardiac enlargement.  Can not exclude early interstitial edema. Electronically Signed   By: Elsie Gravely M.D.   On: 11/21/2023 20:00   ECHOCARDIOGRAM COMPLETE BUBBLE STUDY Result Date: 11/06/2023    ECHOCARDIOGRAM REPORT   Patient Name:   Nevelyn Mellott Date of Exam: 11/06/2023 Medical Rec #:  [id number]              Height:       62.0 in Accession #:    [id number]             Weight:       142.6 lb Date of Birth:  Apr 07, 1933                BSA:          1.656 m Patient Age:    89 years               BP:           110/79 mmHg Patient Gender: F                      HR:           89 bpm. Exam Location:  Inpatient Procedure: 2D Echo, Cardiac Doppler, Color Doppler and Saline Contrast Bubble            Study (Both Spectral and Color Flow Doppler were utilized during            procedure). Indications:    Stroke I63.9  History:        Patient has prior history of Echocardiogram examinations, most                 recent 02/16/2021. CHF, Previous Myocardial Infarction, COPD and                 CKD, stage 3, Arrythmias:Atrial Fibrillation; Risk                 Factors:Hypertension and Dyslipidemia.  Sonographer:    Thea Norlander RCS Referring Phys: MARSHA ADA IMPRESSIONS  1. Left ventricular ejection fraction, by estimation, is 60 to 65%. The  left ventricle has normal function. The left ventricle has no regional wall motion abnormalities. Left ventricular diastolic parameters are consistent with Grade II diastolic dysfunction (pseudonormalization).  2. Right ventricular systolic function is normal. The right ventricular size is normal. There is severely elevated pulmonary artery systolic pressure. The estimated right ventricular systolic pressure is 67.0 mmHg.  3. Left atrial size was moderately dilated.  4. Negative bubble study, no evidence for PFO or ASD.  5. MVA 1.08 cm^2 by VTI. The mitral valve is degenerative. Trivial mitral valve regurgitation. Severe mitral stenosis. The mean mitral valve gradient is 9.0 mmHg. Moderate to severe mitral annular calcification.  6. Tricuspid valve regurgitation is moderate.  7. The aortic valve is tricuspid. Aortic valve regurgitation is trivial. Moderate aortic valve stenosis. Aortic valve area, by VTI measures 1.21 cm. Aortic valve mean gradient measures 17.0 mmHg.  8. The inferior vena cava is normal in size with greater than 50% respiratory variability, suggesting right atrial pressure of 3 mmHg.  FINDINGS  Left Ventricle: Left ventricular ejection fraction, by estimation, is 60 to 65%. The left ventricle has normal function. The left ventricle has no regional wall motion abnormalities. The left ventricular internal cavity size was normal in size. There is  no left ventricular hypertrophy. Left ventricular diastolic parameters are consistent with Grade II diastolic dysfunction (pseudonormalization). Right Ventricle: The right ventricular size is normal. No increase in right ventricular wall thickness. Right ventricular systolic function is normal. There is severely elevated pulmonary artery systolic pressure. The tricuspid regurgitant velocity is 4.00 m/s, and with an assumed right atrial pressure of 3 mmHg, the estimated right ventricular systolic pressure is 67.0 mmHg. Left Atrium: Left atrial size was moderately dilated. Right Atrium: Right atrial size was normal in size. Pericardium: There is no evidence of pericardial effusion. Mitral Valve: MVA 1.08 cm^2 by VTI. The mitral valve is degenerative in appearance. There is severe calcification of the mitral valve leaflet(s). Moderate to severe mitral annular calcification. Trivial mitral valve regurgitation. Severe mitral valve stenosis. MV peak gradient, 16.5 mmHg. The mean mitral valve gradient is 9.0 mmHg. Tricuspid Valve: The tricuspid valve is normal in structure. Tricuspid valve regurgitation is moderate. Aortic Valve: The aortic valve is tricuspid. Aortic valve regurgitation is trivial. Moderate aortic stenosis is present. Aortic valve mean gradient measures 17.0 mmHg. Aortic valve peak gradient measures 24.9 mmHg. Aortic valve area, by VTI measures 1.21  cm. Pulmonic Valve: The pulmonic valve was normal in structure. Pulmonic valve regurgitation is not visualized. Aorta: The aortic root is normal in size and structure. Venous: The inferior vena cava is normal in size with greater than 50% respiratory variability, suggesting right atrial pressure of  3 mmHg. IAS/Shunts: Negative bubble study, no evidence for PFO or ASD. Agitated saline contrast was given intravenously to evaluate for intracardiac shunting.  LEFT VENTRICLE PLAX 2D LVIDd:         4.30 cm   Diastology LVIDs:         2.70 cm   LV e' medial:    11.20 cm/s LV PW:         0.70 cm   LV E/e' medial:  16.9 LV IVS:        0.80 cm   LV e' lateral:   10.30 cm/s LVOT diam:     1.90 cm   LV E/e' lateral: 18.3 LV SV:         54 LV SV Index:   33 LVOT Area:     2.84 cm  RIGHT VENTRICLE             IVC RV S prime:     20.50 cm/s  IVC diam: 1.30 cm TAPSE (M-mode): 1.8 cm LEFT ATRIUM             Index        RIGHT ATRIUM           Index LA diam:        3.90 cm 2.36 cm/m   RA Area:     15.70 cm LA Vol (A2C):   57.2 ml 34.54 ml/m  RA Volume:   37.45 ml  22.62 ml/m LA Vol (A4C):   62.0 ml 37.44 ml/m LA Biplane Vol: 60.3 ml 36.42 ml/m  AORTIC VALVE AV Area (Vmax):    1.27 cm AV Area (Vmean):   1.23 cm AV Area (VTI):     1.21 cm AV Vmax:           249.67 cm/s AV Vmean:          176.000 cm/s AV VTI:            0.451 m AV Peak Grad:      24.9 mmHg AV Mean Grad:      17.0 mmHg LVOT Vmax:         112.00 cm/s LVOT Vmean:        76.100 cm/s LVOT VTI:          0.192 m LVOT/AV VTI ratio: 0.43  AORTA Ao Root diam: 3.30 cm Ao Asc diam:  3.40 cm MITRAL VALVE                TRICUSPID VALVE MV Area (PHT): 3.31 cm     TR Peak grad:   64.0 mmHg MV Area VTI:   1.08 cm     TR Vmax:        400.00 cm/s MV Peak grad:  16.5 mmHg MV Mean grad:  9.0 mmHg     SHUNTS MV Vmax:       2.03 m/s     Systemic VTI:  0.19 m MV Vmean:      137.5 cm/s   Systemic Diam: 1.90 cm MV Decel Time: 229 msec MV E velocity: 189.00 cm/s MV A velocity: 135.00 cm/s MV E/A ratio:  1.40 Dalton McleanMD Electronically signed by Ezra Kanner Signature Date/Time: 11/06/2023/6:21:01 PM    Final    VAS US  CAROTID Result Date: 11/05/2023 Carotid Arterial Duplex Study Patient Name:  KARLISA GAUBERT  Date of Exam:   11/05/2023 Medical Rec #: [id number]                Accession #:    [id number] Date of Birth: May 09, 1933                Patient Gender: F Patient Age:   69 years Exam Location:  [facility] Procedure:      VAS US  CAROTID Referring Phys: MARSHA ADA --------------------------------------------------------------------------------  Indications:       CVA. Risk Factors:      Hypertension, hyperlipidemia, Diabetes. Comparison Study:  Previous study on 4.10.2014. Performing Technologist: Edilia Elden Appl  Examination Guidelines: A complete evaluation includes B-mode imaging, spectral Doppler, color Doppler, and power Doppler as needed of all accessible portions of each vessel. Bilateral testing is considered an integral part of a complete examination. Limited examinations for reoccurring indications may be performed as noted.  Right Carotid Findings: +----------+--------+--------+--------+-------------------------+--------+  PSV cm/sEDV cm/sStenosisPlaque Description       Comments +----------+--------+--------+--------+-------------------------+--------+ CCA Prox  46                                                        +----------+--------+--------+--------+-------------------------+--------+ CCA Distal59      14              heterogenous and calcific         +----------+--------+--------+--------+-------------------------+--------+ ICA Prox  88      26              heterogenous and calcific         +----------+--------+--------+--------+-------------------------+--------+ ICA Mid   93      24                                                +----------+--------+--------+--------+-------------------------+--------+ ICA Distal76      29                                                +----------+--------+--------+--------+-------------------------+--------+ ECA       93                                                         +----------+--------+--------+--------+-------------------------+--------+ +----------+--------+-------+----------------+-------------------+           PSV cm/sEDV cmsDescribe        Arm Pressure (mmHG) +----------+--------+-------+----------------+-------------------+ [id number]             Multiphasic, WNL                    +----------+--------+-------+----------------+-------------------+ +---------+--------+--+--------+--+---------+ VertebralPSV cm/s75EDV cm/s14Antegrade +---------+--------+--+--------+--+---------+  Left Carotid Findings: +----------+--------+--------+--------+-------------------------+--------+           PSV cm/sEDV cm/sStenosisPlaque Description       Comments +----------+--------+--------+--------+-------------------------+--------+ CCA Prox  66      17                                                +----------+--------+--------+--------+-------------------------+--------+ CCA Distal54      12              heterogenous and calcific         +----------+--------+--------+--------+-------------------------+--------+ ICA Prox  112     27              heterogenous and calcific         +----------+--------+--------+--------+-------------------------+--------+ ICA Mid   154     30      1-39%                                     +----------+--------+--------+--------+-------------------------+--------+ ICA [id number]     39  1-39%                                     +----------+--------+--------+--------+-------------------------+--------+ ECA       61      8                                                 +----------+--------+--------+--------+-------------------------+--------+ +----------+--------+--------+----------------+-------------------+           PSV cm/sEDV cm/sDescribe        Arm Pressure (mmHG) +----------+--------+--------+----------------+-------------------+ [id number]              Multiphasic, WNL                     +----------+--------+--------+----------------+-------------------+ +---------+--------+--+--------+----------+ VertebralPSV cm/s74EDV cm/sRetrograde +---------+--------+--+--------+----------+ Retrograde flow noted in the left vertebral artery, suggesting subclavian steal.  Summary: Right Carotid: Velocities in the right ICA are consistent with a 1-39% stenosis. Left Carotid: Velocities in the left ICA are consistent with a 1-39% stenosis. Vertebrals:  Right vertebral artery demonstrates antegrade flow. Left vertebral              artery demonstrates retrograde flow. Subclavians: Normal flow hemodynamics were seen in bilateral subclavian              arteries. *See table(s) above for measurements and observations.  Electronically signed by Penne Colorado MD on 11/05/2023 at 7:42:09 PM.    Final    MR ANGIO HEAD WO CONTRAST Result Date: 11/05/2023 EXAM: MR Angiography Head without intravenous Contrast. 11/05/2023 01:57:00 AM TECHNIQUE: Magnetic resonance angiography images of the head without intravenous contrast. Multiplanar 2D and 3D reformatted images are provided for review. COMPARISON: None provided. CLINICAL HISTORY: Stroke/TIA, determine embolic source. FINDINGS: ANTERIOR CIRCULATION: No significant stenosis of the internal carotid arteries. No significant stenosis of the anterior cerebral arteries. No significant stenosis of the middle cerebral arteries. No aneurysm. POSTERIOR CIRCULATION: There is occlusion of the left PCA distal P2 segment with intermittent distal reconstitution. No significant stenosis of the basilar artery. No significant stenosis of the vertebral arteries. No aneurysm. IMPRESSION: 1. Left PCA P2 segment occlusion with intermittent distal reconstitution. Electronically signed by: Franky Stanford MD 11/05/2023 02:11 AM EDT RP Workstation: [id number]   MR BRAIN WO CONTRAST Result Date: 11/04/2023 CLINICAL DATA:  Altered mental status, nontraumatic (Ped 0-17y) EXAM:  MRI HEAD WITHOUT CONTRAST TECHNIQUE: Multiplanar, multiecho pulse sequences of the brain and surrounding structures were obtained without intravenous contrast. COMPARISON:  CT head from earlier today. FINDINGS: Brain: Acute left thalamocapsular infarct. No mass effect. No midline shift. No acute hemorrhage. Moderate T2/FLAIR hyperintensities in the white matter which are compatible with chronic microvascular ischemic change. No mass lesion. Small amount of curvilinear susceptibility artifact along the posterior parietooccipital convexities bilaterally suggestive of superficial siderosis. Vascular: Major arterial flow voids are maintained at the skull base. Skull and upper cervical spine: Normal marrow signal. Sinuses/Orbits: Clear sinuses.  No acute orbital findings. Other: No mastoid effusions IMPRESSION: 1. Acute left thalamocapsular infarct. 2. Moderate chronic microvascular disease. 3. Suspected small amount of superficial siderosis along the parieto-occipital convexities. Electronically Signed   By: Gilmore GORMAN Molt M.D.   On: 11/04/2023 15:43   DG Chest Portable 1 View Result Date: 11/04/2023 EXAM: 1 VIEW XRAY OF THE CHEST 11/04/2023 02:43:53 AM COMPARISON:  10/11/2023 CLINICAL HISTORY: painetc. FINDINGS: LUNGS AND PLEURA: Mild bibasilar opacities, likely atelectasis. No pleural effusion. No pneumothorax. HEART AND MEDIASTINUM: Stable mild cardiomegaly. Thoracic aortic atherosclerosis. BONES AND SOFT TISSUES: No acute osseous abnormality. IMPRESSION: 1. Mild bibasilar opacities, likely atelectasis. 2. No acute abnormalities. Electronically signed by: Pinkie Pebbles MD 11/04/2023 02:47 AM EDT RP Workstation: [id number]   CT HEAD CODE STROKE WO CONTRAST Result Date: 11/04/2023 EXAM: CT HEAD WITHOUT CONTRAST 11/04/2023 01:51:22 AM TECHNIQUE: CT of the head was performed without the administration of intravenous contrast. Automated exposure control, iterative reconstruction, and/or weight based adjustment  of the mA/kV was utilized to reduce the radiation dose to as low as reasonably achievable. COMPARISON: None available. CLINICAL HISTORY: Neuro deficit, acute, stroke suspected. Stroke; Dr. Merrianne FINDINGS: BRAIN AND VENTRICLES: Chronic ischemic white matter changes. Mild volume loss. ASPECTS is 10. Old cerebellar small vessel infarcts. No acute hemorrhage. No evidence of acute infarct. No hydrocephalus. No extra-axial collection. No mass effect or midline shift. ORBITS: No acute abnormality. SINUSES: No acute abnormality. SOFT TISSUES AND SKULL: No acute soft tissue abnormality. No skull fracture. IMPRESSION: 1. No acute intracranial abnormality. ASPECTS is 10. 2. Chronic ischemic white matter changes, mild volume loss, and old cerebellar small vessel infarcts. 3. Findings communicated to Dr. Lindzen at 2:01 am on 11/04/2023. Electronically signed by: Franky Stanford MD 11/04/2023 02:02 AM EDT RP Workstation: [id number]    Labs:  Basic Metabolic Panel: Recent Labs  Lab 11/17/23 1014 11/20/23 0625 11/21/23 0504 11/22/23 0454 11/23/23 0537  NA 144 142 144 142 145  K 4.7 5.2* 5.0 4.9 5.1  CL 110 110 110 111 111  CO2 23 20* 27 24 26   GLUCOSE 95 86 98 100* 93  BUN 34* 44* 49* 42* 41*  CREATININE 1.72* 1.89* 1.97* 1.79* 1.72*  CALCIUM  8.4* 8.5* 8.5* 8.2* 8.6*  MG  --   --   --  2.6*  --     CBC: Recent Labs  Lab 11/20/23 0625 11/23/23 0537  WBC 7.0 5.8  HGB 8.1* 7.5*  HCT 27.9* 25.2*  MCV 98.9 98.1  PLT 257 277    CBG: No results for input(s): GLUCAP in the last 168 hours.  Brief HPI:   Krishauna Schatzman is a 89 y.o. female  with past medical history significant for breast cancer, COPD, hypertension, and hyperlipidemia. The patient initially presented to [facility] on 11/04/2023 as a possible stroke alert due to altered mental status, confusion, and dysarthria. A CT scan of the head showed no acute process. However, an MRI revealed an acute left thalamic lacunar infarct,  and an MRA of the head without contrast showed left PCA P2 segment occlusion with intermittent distal reconstitution. Neurology assessed that the etiology was embolic due to a history of atrial fibrillation and noncompliance with anticoagulation therapy. Consequently, the patient was resumed on Eliquis . A transthoracic echocardiogram showed an ejection fraction of 60-65% with grade two diastolic dysfunction, with no change compared to a previous echocardiogram.  The hospital course was complicated by acute metabolic encephalopathy secondary to the stroke and UTI secondary to acute cystitis. The patient completed a 3 day course of IV ceftriaxone . The patient was also evaluated by speech therapy for dysphagia and recommended a dysphagia 3 (D3) diet. Previously, the patient was independent with a rolling walker and family assistance for stair navigation, but currently requires minimal assistance for mobility and moderate assistance for transfers and ADLs. Therapy evaluations completed due to patient decreased functional mobility was admitted for  a comprehensive rehab program.     Hospital Course: Monzerrath Mcburney Rosengren was admitted to rehab 11/08/2023 for inpatient therapies to consist of PT, ST and OT at least three hours five days a week. Past admission physiatrist, therapy team and rehab RN have worked together to provide customized collaborative inpatient rehab.  Functional deficits secondary to a left thalamic capsular infarct.  Antithrombotics: Eliquis  2.5 mg twice daily   Pain Management: Multimodal pain control via tylenol  and robaxin  500 mg as needed. Gabapentin was discontinued due to cognitive changes.   Mood/Behavior/Sleep: Melatonin 3 mg at bedtime as needed   Neuropsych/Cognition: The patient was capable of making his own decisions.   Skin/Wound Care: Wound care/signs and symptoms of infection discussed at discharge. R anterior skin tear from fall. Staple upon discharge, continue use of  Eucerin daily for dry skin.    Fluid/Nutrition/Electrolytes: Intake and output were monitored along with daily weights.  The patient was maintained on a Dys 3  diet, then advanced to Dys 2. Megace was somewhat helpful with appetite. Fluid intake was encouraged but due to low intake IV fluids were initiated and later discontinued.   PAF: history of paroxysmal atrial fibrillation and noncompliance with Eliquis , which was resumed with detailed instructions for continuous use without pause or interruptions. Cardiology recommendations included switching from long-acting metoprolol  to Lopressor  25 mg twice daily, with heart rate remaining within normal parameters and atrial fibrillation rate controlled.   Hypertension/HFpEF: Hydralazine  25 mg twice a day, with routine blood pressure monitoring. An episode of hypotension occurred but resolved with oral hydration. Lower extremity edema remained similar to past presentation, initially managed with Lasix  20 mg daily for gentle diuresis, which was later discontinued due to acute kidney injury (AKI) and hypovolemia. Peripheral edema was stable at discharge. Chest X-ray revealed cardiac enlargement versus early interstitial edema and bilateral small pleural effusions. Due to difficulties with fluid balance and multiple recent changes to heart failure medications, the patient was advised to have close follow-up with cardiology for monitoring.  Hyperlipidemia: Crestor  discontinued.  Now on Lipitor  80 mg daily.  Acute kidney injury: BUN/creatinine stable.  Patient is advised to continue aggressive oral fluid intake even while using Lasix .  COPD: Continue Breztri  and DuoNebs as needed.   Constipation: Resolved  Anemia of chronic disease/nosebleed: Continue supplementation with iron , vitamin C/B12 and folic acid.  Iron  studies significant for anemia of chronic disease.  Hemoglobin remained stable.  Patient experienced episode of epistaxis, resolved with Afrin.       Planned Outpatient Follow-Up: PCP, [facility], PM&R, and cardiology.   Discharge plan was discussed with patient and/or family member and they verbalized understanding and agreed wit                                                                          Rehab course: During patient's stay in rehab weekly team conferences were held to monitor patient's progress, set goals and discuss barriers to discharge. At admission, patient required minimal assistance for mobility and moderate assistance for transfers and ADLs.   Occupational Therapy: Patient met all long term goals due to improved activity tolerance, improved balance, ability to compensate for deficits, functional use of  RUE and RLE, improved awareness,  and improved coordination. She will discharge at overall supervision level with good understanding and adherence to precautions. She will benefit from ongoing skill OT services in home health setting.    Physical Therapy: Patient met 5 of 9  LTG and has had improvementlong term goals due to improved activity tolerance, improved balance, improved postural control, increased strength, improved attention, improved awareness, and improved coordination. Discharging at ambulatory level supervision/CGA. She continues to require supervision/CGA for transfers and ambulation due to ongoing fall risk and will benefit from ongoing skilled PT in home health setting.   Speech Therapy: Patient met all long term goals and discharges at overall supervision level.  She currently tolerates a Dys 1 diet w/ thin liquids @ modI. Per pt and family, she tolerates Dys 1-2 @ at baseline w/o difficulty. They verbalized understanding re food options and mastication limitations vs true dysphagia at this time. She would benefit from continued ST upon d/c to target remaining deficits, maximize pt independence, and reduce caregiver burden.    Disposition:  Discharge disposition: 06-Home-Health Care  Svc        Diet: Dys 3  Special Instructions:  -No driving or operating heavy machinery until cleared by provider  -No smoking or alcohol  or illicit drug use    Discharge Instructions     Ambulatory referral to Neurology   Complete by: As directed    An appointment is requested in approximately: 4 weeks   Ambulatory referral to Physical Medicine Rehab   Complete by: As directed       Allergies as of 11/23/2023   No Known Allergies      Medication List     PAUSE taking these medications    hydrALAZINE  25 MG tablet Wait to take this until your doctor or other care provider tells you to start again. Commonly known as: APRESOLINE  Take 25 mg by mouth 2 (two) times daily.       STOP taking these medications    metoprolol  succinate 25 MG 24 hr tablet Commonly known as: TOPROL -XL   rosuvastatin  40 MG tablet Commonly known as: CRESTOR        TAKE these medications    acetaminophen  500 MG tablet Commonly known as: TYLENOL  Take 2 tablets (1,000 mg total) by mouth 3 (three) times daily.   albuterol  108 (90 Base) MCG/ACT inhaler Commonly known as: VENTOLIN  HFA INHALE TWO PUFFS into THE lungs EVERY 6 HOURS AS NEEDED FOR wheezing OR SHORTNESS OF BREATH   apixaban  2.5 MG Tabs tablet Commonly known as: Eliquis  Take 1 tablet (2.5 mg total) by mouth 2 (two) times daily.   atorvastatin  80 MG tablet Commonly known as: LIPITOR  Take 1 tablet (80 mg total) by mouth daily.   Ferrous Fumarate 324 (106 Fe) MG Tabs tablet Commonly known as: HEMOCYTE - 106 mg FE Take 1 tablet (106 mg of iron  total) by mouth daily.   folic acid 1 MG tablet Commonly known as: FOLVITE Take 1 tablet (1 mg total) by mouth daily.   furosemide  20 MG tablet Commonly known as: LASIX  Take 1 tablet (20 mg total) by mouth daily. What changed:  medication strength how much to take when to take this   lidocaine  5 % Commonly known as: LIDODERM  Place 2 patches onto the skin daily. Remove &  Discard patch within 12 hours or as directed by MD   megestrol 400 MG/10ML suspension Commonly known as: MEGACE Take 5 mLs (200 mg total) by mouth 2 (two) times daily.   metoprolol  tartrate  25 MG tablet Commonly known as: LOPRESSOR  Take 1 tablet (25 mg total) by mouth 2 (two) times daily.   mirtazapine  7.5 MG tablet Commonly known as: REMERON  Take 1 tablet (7.5 mg total) by mouth at bedtime.   multivitamin with minerals Tabs tablet Take 1 tablet by mouth daily.   OXYGEN  Inhale 4 L into the lungs continuous.   oxymetazoline  0.05 % nasal spray Commonly known as: AFRIN Place 1 spray into both nostrils 2 (two) times daily as needed for congestion (nosebleeds).   pantoprazole  40 MG tablet Commonly known as: PROTONIX  Take 1 tablet (40 mg total) by mouth 2 (two) times daily before a meal.   Trelegy Ellipta  200-62.5-25 MCG/ACT Aepb Generic drug: Fluticasone -Umeclidin-Vilant Inhale 1 puff into the lungs daily.   Vitamin B 12 500 MCG Tabs Take 1 tablet by mouth daily.   Vitamin D  50 MCG (2000 UT) Caps Take 2,000 Units by mouth daily.        Follow-up Information     Elliot Charm, MD Follow up.   Specialty: Internal Medicine Why: Call for an appointment for hospital follow up Contact information: [address] [name] [address] [phone]         [facility] Follow up.   Specialty: Neurology Why: Call for an appointment. Contact information: [address] [place]  [address] [phone]                Signed: Daphne LITTIE Satterfield 11/23/2023, 10:33 AM

## 2023-11-22 NOTE — Telephone Encounter (Signed)
 Pharmacy Patient Advocate Encounter  Received notification from [employer] that Prior Authorization for Lidocaine  5% patches  has been APPROVED from 11/22/2023 to 02/13/2025   PA #/Case ID/Reference #: [id number] KEY: [id number]

## 2023-11-22 NOTE — Progress Notes (Signed)
 Speech Language Pathology Daily Session Note  Patient Details  Name: Licet Dunphy MRN: [id number] Date of Birth: [birth year]  Today's Date: 11/22/2023 SLP Individual Time: 0800-0900 SLP Individual Time Calculation (min): 60 min  Short Term Goals: Week 2: SLP Short Term Goal 1 (Week 2): STGs = LTGs d/t ELOS  Skilled Therapeutic Interventions:   Pt and her son greeted at bedside. She was awake/alert upon SLP arrival, but reported some increased chest congestion and cough as of last night. Intermittent congested cough noted. Despite this, she remained agreeable to tx tasks targeting cognition. With s cues to utilize the calendar and ensure accuracy of details, she was able to recall orientation information w/ 100% accuracy. SLP then facilitated written decoding task targeting information processing, organization, and working memory @ modI. Improved success w/ this type of task as compared to last week. Also completed word finding task (unscrambling letters) @ supervisionA. SLP reviewed tx goals and progress thus far. She was also provided w/ tx tasks to continue in the home environment. She continues to demonstrate adequate safety awareness and reasoning re physical and cognitive limitations at home. At the end of tx tasks, she was left in bed w/ the alarm set and call light within reach. See d/c summary for more info.   Pain Pain Assessment Pain Scale: 0-10 Pain Score: 0-No pain  Therapy/Group: Individual Therapy  [name] 11/22/2023, 8:16 AM

## 2023-11-22 NOTE — Discharge Instructions (Addendum)
 Inpatient Rehab Discharge Instructions  Courtney Cline [facility] Discharge date and time: 11/23/23   Activities/Precautions/ Functional Status: Activity: no lifting, driving, or strenuous exercise for until cleared by MD Diet: cardiac diet Wound Care: none needed Functional status:  ___ No restrictions     ___ Walk up steps independently ___ 24/7 supervision/assistance   __X_ Walk up steps with assistance ___ Intermittent supervision/assistance  ___ Bathe/dress independently _X__ Walk with walker    ___ Bathe/dress with assistance ___ Walk Independently    ___ Shower independently __X_ Walk with assistance    __X_ Shower with assistance ___ No alcohol      ___ Return to work/school ________  Special Instructions:    My questions have been answered and I understand these instructions. I will adhere to these goals and the provided educational materials after my discharge from the hospital.  Patient/Caregiver Signature _______________________________ Date __________  Clinician Signature _______________________________________ Date __________  Please bring this form and your medication list with you to all your follow-up doctor's appointments.        COMMUNITY REFERRALS UPON DISCHARGE:    Home Health:   PT      OT      ST   SNA                 Agency: [facility]  Phone:  [phone] *Please expect follow-up within 2-3 business days for discharge to schedule your home visit. If you have not received follow-up, be sure to contact the site directly.*   Medical Equipment/Items Ordered:3in1 bedside commode and rolling walker                                                 Agency/Supplier: [employer] [phone]     STROKE/TIA DISCHARGE INSTRUCTIONS SMOKING Cigarette smoking nearly doubles your risk of having a stroke & is the single most alterable risk factor  If you smoke or have smoked in the last 12 months, you are advised to quit smoking for your health. Most of  the excess cardiovascular risk related to smoking disappears within a year of stopping. Ask you doctor about anti-smoking medications [place] Quit Line: 1-800-QUIT NOW Free Smoking Cessation Classes [phone]  CHOLESTEROL Know your levels; limit fat & cholesterol in your diet  Lipid Panel     Component Value Date/Time   CHOL 99 11/06/2023 0141   TRIG 60 11/06/2023 0141   HDL 46 11/06/2023 0141   CHOLHDL 2.2 11/06/2023 0141   VLDL 12 11/06/2023 0141   LDLCALC 41 11/06/2023 0141     Many patients benefit from treatment even if their cholesterol is at goal. Goal: Total Cholesterol (CHOL) less than 160 Goal:  Triglycerides (TRIG) less than 150 Goal:  HDL greater than 40 Goal:  LDL (LDLCALC) less than 100   BLOOD PRESSURE American Stroke Association blood pressure target is less that 120/80 mm/Hg  Your discharge blood pressure is:  BP: (!) 139/54 Monitor your blood pressure Limit your salt and alcohol  intake Many individuals will require more than one medication for high blood pressure  DIABETES (A1c is a blood sugar average for last 3 months) Goal HGBA1c is under 7% (HBGA1c is blood sugar average for last 3 months)  Diabetes: No known diagnosis of diabetes    Lab Results  Component Value Date   HGBA1C 5.2 11/05/2023  Your HGBA1c can be lowered with medications, healthy diet, and exercise. Check your blood sugar as directed by your physician Call your physician if you experience unexplained or low blood sugars.  PHYSICAL ACTIVITY/REHABILITATION Goal is 30 minutes at least 4 days per week  Activity: Increase activity slowly,, No driving,, and Walk with assistance, Therapies: Physical Therapy: Home Health, Occupational Therapy: Home Health, and Speech Therapy: Home Health Return to work: [name] Activity decreases your risk of heart attack and stroke and makes your heart stronger.  It helps control your weight and blood pressure; helps you relax and can improve your mood. Participate  in a regular exercise program. Talk with your doctor about the best form of exercise for you (dancing, walking, swimming, cycling).  DIET/WEIGHT Goal is to maintain a healthy weight  Your discharge diet is:  Diet Order             DIET - DYS 1 Room service appropriate? Yes; Fluid consistency: Thin  Diet effective now                  Your height is:  Height: 5' 2 (157.5 cm) Your current weight is: Weight: 68.4 kg Your Body Mass Index (BMI) is:  BMI (Calculated): 27.57 Following the type of diet specifically designed for you will help prevent another stroke. Your goal Body Mass Index (BMI) is 19-24. Healthy food habits can help reduce 3 risk factors for stroke:  High cholesterol, hypertension, and excess weight.  RESOURCES Stroke/Support Group:  Call [phone]   STROKE EDUCATION PROVIDED/REVIEWED AND GIVEN TO PATIENT Stroke warning signs and symptoms How to activate emergency medical system (call 911). Medications prescribed at discharge. Need for follow-up after discharge. Personal risk factors for stroke. Pneumonia vaccine given: No Flu vaccine given: No My questions have been answered, the writing is legible, and I understand these instructions.  I will adhere to these goals & educational materials that have been provided to me after my discharge from the hospital.

## 2023-11-23 DIAGNOSIS — I639 Cerebral infarction, unspecified: Secondary | ICD-10-CM | POA: Diagnosis not present

## 2023-11-23 LAB — CBC
HCT: 25.2 % — ABNORMAL LOW (ref 36.0–46.0)
Hemoglobin: 7.5 g/dL — ABNORMAL LOW (ref 12.0–15.0)
MCH: 29.2 pg (ref 26.0–34.0)
MCHC: 29.8 g/dL — ABNORMAL LOW (ref 30.0–36.0)
MCV: 98.1 fL (ref 80.0–100.0)
Platelets: 277 K/uL (ref 150–400)
RBC: 2.57 MIL/uL — ABNORMAL LOW (ref 3.87–5.11)
RDW: 17.2 % — ABNORMAL HIGH (ref 11.5–15.5)
WBC: 5.8 K/uL (ref 4.0–10.5)
nRBC: 0 % (ref 0.0–0.2)

## 2023-11-23 LAB — BASIC METABOLIC PANEL WITH GFR
Anion gap: 8 (ref 5–15)
BUN: 41 mg/dL — ABNORMAL HIGH (ref 8–23)
CO2: 26 mmol/L (ref 22–32)
Calcium: 8.6 mg/dL — ABNORMAL LOW (ref 8.9–10.3)
Chloride: 111 mmol/L (ref 98–111)
Creatinine, Ser: 1.72 mg/dL — ABNORMAL HIGH (ref 0.44–1.00)
GFR, Estimated: 28 mL/min — ABNORMAL LOW (ref >60)
Glucose, Bld: 93 mg/dL (ref 70–99)
Potassium: 5.1 mmol/L (ref 3.5–5.1)
Sodium: 145 mmol/L (ref 135–145)

## 2023-11-23 NOTE — Progress Notes (Signed)
 PROGRESS NOTE   Subjective/Complaints:  No acute complaints.  No events overnight.  Patient breathing much better today, feeling better compared to yesterday. BUN, Cr stable.  Discussed with patient need to increase p.o. fluid intakes, given that creatinine remains elevated from intake; she is agreeable to this. Multiple medium to large Bms this AM, type 6 Afrin for nosebleeds yesterday and this AM Hgb stable 7-8   ROS: Denies fevers, chills, N/V, abdominal pain, constipation, diarrhea, cough, chest pain, new weakness or paraesthesias.   Poor appetite Knee pain, calf pain Epistaxis  Objective:   DG CHEST PORT 1 VIEW Result Date: 11/21/2023 CLINICAL DATA:  Shortness of breath EXAM: PORTABLE CHEST 1 VIEW COMPARISON:  11/04/2023 FINDINGS: Cardiac enlargement. No vascular congestion. Emphysematous changes in the lungs. Fine diffuse interstitial pattern may represent fibrosis or edema. Small bilateral pleural effusions with basilar atelectasis. No pneumothorax. Mediastinal contours appear intact. Calcification of the aorta. Degenerative changes in the spine. IMPRESSION: 1. Emphysematous changes and fibrosis in the lungs. 2. Small bilateral pleural effusions with basilar atelectasis. 3. Cardiac enlargement.  Can not exclude early interstitial edema. Electronically Signed   By: Elsie Gravely M.D.   On: 11/21/2023 20:00   Recent Labs    11/23/23 0537  WBC 5.8  HGB 7.5*  HCT 25.2*  PLT 277     Recent Labs    11/22/23 0454 11/23/23 0537  NA 142 145  K 4.9 5.1  CL 111 111  CO2 24 26  GLUCOSE 100* 93  BUN 42* 41*  CREATININE 1.79* 1.72*  CALCIUM  8.2* 8.6*     Intake/Output Summary (Last 24 hours) at 11/23/2023 0858 Last data filed at 11/22/2023 1307 Gross per 24 hour  Intake 240 ml  Output --  Net 240 ml        Physical Exam: Vital Signs Blood pressure (!) 139/54, pulse 69, temperature 98.5 F (36.9 C),  temperature source Oral, resp. rate 20, height 5' 2 (1.575 m), weight 68.4 kg, SpO2 100%.  Constitutional: No apparent distress. Appropriate appearance for age.  Sitting up in bedside. HENT: No JVD. Neck Supple. Trachea midline. Atraumatic, normocephalic. Eyes: PERRLA. EOMI. Visual fields grossly intact.  Cardiovascular: RRR, no murmurs/rub/gallops. Trace bilateral lower extremity edema. Respiratory: Globaly diminished, but clear to auscultation bilaterally.   On 4 L nasal cannula  Abdomen: + bowel sounds, normoactive. No distention or tenderness. Soft.  Skin: skin tear RLE covered--stable, covered in clean Ace bandage  MSK: No apparent deformity.    + TTP bilateral knees, R calf  Neurologic exam:  Cognition: AAO to person, place, and time with some cueing + Mild memory deficits, otherwise cognition intact.  Insight: Good  insight into current condition.  Mood: Pleasant affect, appropriate mood.  Sensation: Equal and intact in BL UE and Les.  Reflexes:  Negative Hoffman's and babinski signs bilaterally.  CN: 2-12 grossly intact.  Coordination: No apparent tremors. No ataxia on FTN, HTS bilaterally.  Spasticity: MAS 0 in all extremities.  Strength: 4/5 throughout BL UE and LE -unchanged   Assessment/Plan: 1. Functional deficits which require 3+ hours per day of interdisciplinary therapy in a comprehensive inpatient rehab setting. Physiatrist is providing close team  supervision and 24 hour management of active medical problems listed below. Physiatrist and rehab team continue to assess barriers to discharge/monitor patient progress toward functional and medical goals  Care Tool:  Bathing  Bathing activity did not occur: Safety/medical concerns (Limited eval due to elevated/variable HR (67-135 bpm).) Body parts bathed by patient: Right upper leg, Left upper leg, Buttocks, Front perineal area, Face, Right arm, Left arm, Chest, Abdomen         Bathing assist Assist Level: Minimal  Assistance - Patient > 75%     Upper Body Dressing/Undressing Upper body dressing Upper body dressing/undressing activity did not occur (including orthotics): Safety/medical concerns (Limited eval due to elevated/variable HR (67-135 bpm).) What is the patient wearing?: Pull over shirt    Upper body assist Assist Level: Set up assist    Lower Body Dressing/Undressing Lower body dressing    Lower body dressing activity did not occur: Safety/medical concerns (Limited eval due to elevated/variable HR (67-135 bpm).) What is the patient wearing?: Pants, Incontinence brief     Lower body assist Assist for lower body dressing: Minimal Assistance - Patient > 75%     Toileting Toileting    Toileting assist Assist for toileting: Supervision/Verbal cueing     Transfers Chair/bed transfer  Transfers assist  Chair/bed transfer activity did not occur: Safety/medical concerns  Chair/bed transfer assist level: Supervision/Verbal cueing     Locomotion Ambulation   Ambulation assist   Ambulation activity did not occur: Safety/medical concerns  Assist level: Contact Guard/Touching assist Assistive device: Walker-rolling Max distance: 150'   Walk 10 feet activity   Assist  Walk 10 feet activity did not occur: Safety/medical concerns  Assist level: Supervision/Verbal cueing Assistive device: Walker-rolling   Walk 50 feet activity   Assist Walk 50 feet with 2 turns activity did not occur: Safety/medical concerns  Assist level: Supervision/Verbal cueing Assistive device: Walker-rolling    Walk 150 feet activity   Assist Walk 150 feet activity did not occur: Safety/medical concerns  Assist level: Contact Guard/Touching assist Assistive device: Walker-rolling    Walk 10 feet on uneven surface  activity   Assist Walk 10 feet on uneven surfaces activity did not occur: Safety/medical concerns   Assist level: Contact Guard/Touching assist Assistive device:  Walker-rolling   Wheelchair     Assist Is the patient using a wheelchair?: No (uses as transport on unit, will be ambulatory at DC) Type of Wheelchair: Manual Wheelchair activity did not occur: Safety/medical concerns         Wheelchair 50 feet with 2 turns activity    Assist    Wheelchair 50 feet with 2 turns activity did not occur: Safety/medical concerns       Wheelchair 150 feet activity     Assist  Wheelchair 150 feet activity did not occur: Safety/medical concerns       Blood pressure (!) 139/54, pulse 69, temperature 98.5 F (36.9 C), temperature source Oral, resp. rate 20, height 5' 2 (1.575 m), weight 68.4 kg, SpO2 100%.  Medical Problem List and Plan: 1. Functional deficits secondary to left thalamic capsular infarct             -patient may shower             -ELOS/Goals: 10-14 days S - 10/9             -Stable to continue CIR  - 9/30: Lives with husband who cannot provide assist; supervision goals currently, setup for UBD and Mod-Max LBD. Activity tolerance  remains poor as well as balance. Min A goals and CGA for transfers goals. Mod I goals SLP. Per son not far off her baseline for cog. Dys 1 diet downgrade  -  apparently blended all her foods at home.  10/7: SPV bed mobility, transfers CGA. Hand weakness limiting. CGA for toiletting, Min A ADLs. D1 diet with thins - eating better. Mild expressive aphasia, SPV.   The patient is medically ready for discharge to home and will need follow-up with [facility]. In addition, they will need to follow up with their PCP, Neurology and cardiology.     2.  Antithrombotics: -DVT/anticoagulation:  Mechanical: Sequential compression devices, below knee Bilateral lower extremities Pharmaceutical: Eliquis              -antiplatelet therapy: Eliquis  2.5 mg twice daily--tolerating  3. Pain Management: Tylenol  as needed   - 9/30: Start gabapentin 100 mg TID for LE neuropathy, pain  10-1: Ongoing severe back, head pain  overnight.  Scheduled Tylenol  1000 mg 3 times daily.  10-2: DC gabapentin due to cognitive changes.  Add Voltaren gel to bilateral knees  10-6: Add Robaxin  500 mg every 8 hours as needed for muscle cramps--not using, stable  4. Mood/Behavior/Sleep: LCSW to follow for evaluation and support when available.              -antipsychotic agents: [name]             - Continue melatonin 3 mg as needed at bedtime, also has trazodone  PRN 5. Neuropsych/cognition: This patient is capable of making decisions on her own behalf. 6. Skin/Wound Care: Routine pressure relief measures   - 9/30: R anterior skin tear from fall ; eucerin daily for dry skin--stable appearance at discharge, advised to monitor for any developing erythema/cellulitis.  7. Fluids/Electrolytes/Nutrition: Monitor I&O Daily weights with follow-up labs. Continue ensure. Continue vitamins/supplements.              - SLP following; on DYS 3-- now DYS 2 - 9/26: Poor p.o. intakes for several months per family due to lack of appetite since finishing an antibiotic.  Nutrition consulted for recommendations, will start mirtazapine  7.5 mg nightly for appetite. - 9/29: P.o. intakes remain poor.  Start Megace 200 mg twice daily. 9-30: Downgraded to dysphagia 1 diet due to blending foods at home.  Monitor intakes on Megace.--Somewhat improved 10-1 10/2: Patient encouraged to drink at least 6 cups of water per day.  P.o. intakes are some weight better since starting Megace.  Repeat BMP in a.m.; may need to start IV fluids if no improvements 10/6: To get 1 L IV fluid at 75 mL/h today, discussed minimal p.o. fluid intakes and need to significantly increase these. 10/7: Continue IVF at 75 cc/hr; discussed increased fluid intakes- she thinks she drak 2-3 cups only yesterday 10/8: DC IV fluids.  Push p.o. fluid intakes today.     8.  A-fib: History of noncompliance with Eliquis .              - Resumed Eliquis              - continue Metoprolol  25 mg nightly for  rate control, currently stable - 9/25: A-fib with RVR into the 150s with mobilization this a.m.  Got additional dose of metoprolol  this a.m. for coverage.  Cardiology consulted, appreciate recommendations 9-26: Cardiology switched from long-acting metoprolol  to Lopressor  25 mg twice daily; patient is tolerating this much better.  DC IV fluids. -11/11/23 rate controlled, doing well on  this regimen 10-1: Some rate variations overnight, staying between 55 and 101.  Overall stay within parameters. 10-3: Heart rate staying within normal parameters. - Intermittent A-fib , rate controlled.    11/23/2023    5:21 AM 11/23/2023    5:00 AM 11/22/2023    7:58 PM  Vitals with BMI  Weight  150 lbs 13 oz   BMI  27.57   Systolic 139  143  Diastolic 54  58  Pulse 69  84      9.  HTN/HFpEF: continue hydralazine  25 mg twice daily and metoprolol              - Monitor blood pressures with increased activity - 9-25: Hypotensive into the 80s with mobilization; hold hydralazine , continue metoprolol  for rate control, will give 1 L IV fluid today.  Cardiology consult pending. - 9/26: Blood pressure much better today, 140s over 50s.  Daily weight stable. DC IV fluids, monitor.    -9/27-28/25 BPs improving, wt stable, monitor.   10-1: Blood pressure stable 120s to 130s, 50s diastolic.  Continue current regimen.  Monitor.  10-2: Some erroneous blood pressure readings this morning due to being done on left limb (prior known vascular issue per family); Place limb restriction, normotensive on right arm.   10-3: Weights uptrending with PO, however some increased edema in the lower extremities.  Will add Lasix  20 mg daily for gentle diuresis.  Notable, was on Lasix  20 mg twice daily prior to admission; was DC'd initially due to AKI and hypovolemia. -11/18/23 edema better today, wt not done; Cr higher yesterday, will decrease lasix  to 10mg  daily starting tomorrow -11/19/23 wt down, labs tomorrow to check on Cr. Edema  stable/improved on exam.  10-6: IV fluid today as above.  Peripheral edema stable.  DC Lasix .   10/7: IVF as above; will get CXR to assist in volume status eval  - 10-8: Chest x-ray with cardiac enlargement versus early interstitial edema, bilateral small pleural effusions.  Weights up.  DC IV fluids, monitor today, resume lasix  20 mg daily  - 10-9: Improved shortness of breath since resumption of Lasix , minimal change in BUN/creatinine.  Weight stable.  Given difficulties with fluid balance and multiple recent changes to heart failure medications, advised patient and son to make close follow-up with cardiology for monitoring.  They are understanding and agreeable.  Filed Weights   11/21/23 0520 11/22/23 0607 11/23/23 0500  Weight: 65.2 kg 68.5 kg 68.4 kg    10.  HLD: Crestor  transition to Lipitor  80 mg   11.  AKI: Baseline creatinine 1.2-1.5. Required IV fluids.  Currently 1.51    - 9-25: Slow improvement, creatinine 1.4.  IV fluids today as above.  - 9/29: P.o. intakes remain poor, BUN/creatinine up slightly, advised patient to increase p.o. intakes.  Repeat in AM.  9-30: BUN/creatinine improved.  10/2: Creatinine up slightly today.  Patient to work on increased p.o. intakes.  Repeat BMP in AM.  10-3: BMP pending today.  Weights and peripheral edema increases above, starting Lasix  20 mg daily.  11/18/23 Cr slightly higher 1.7 so will decrease lasix  to 10mg  daily starting tomorrow-- recheck labs 10/6  10/6 Cr 1.88; DC lasix ; 1 L NS at 75/hr today, recheck in AM  10/7 Cr 1.9; no obvious medication contributors, continue IVF, repeat in AM  10/8: Creatinine down to 1.7.  Due to concern for volume overload as above, DC IV fluids today.  Encourage p.o.'s, may need to resume Lasix  .  DC Voltaren,  start lidocaine  patch  10-9: BUN/creatinine unchanged.  Advised patient to continue aggressive p.o. fluid intakes as she is still only drinking about 2 cups a day, even while on Lasix .  12.  COPD:   continue Breztri , DuoNeb as needed             - Incentive spirometer   13. Constipation: last BM 9/22, add senna HS  - 9/25: add daily miralax    - 9/26: Give sorbitol  today -11/11/23 multiple BMs with sorbitol  (would be cautious in the future), cont regimen but monitor if still having BMs too often after clean out. -9/30 LBM small--Will increase MiraLAX  to twice daily and Senokot to 2 tablets nightly -10/8 last bowel movement, small  14.  Anemia of chronic disease/nosebleeds.  Add iron  studies, FOBT today.  Add Protonix  40 mg twice daily AC for GI protection.  Repeat H&H in AM.  - Overdue for EPO, which she gets as outpatient, administered 9-29  - On supplementation with iron , vitamin C, B12, and folic acid  - 0-69: Hemoglobin stable, 8.1.  FOBT pending.  Iron  studies significant for anemia chronic disease  - 10-1: FOBT negative.  Hemoglobin remained stable, repeat labs tomorrow a.m.  10/2: stable 10-3: Prolonged nosebleed this a.m., self resolved.  Already on saline nose sprays 3 times daily, will add Afrin as needed. -11/18/23 nursing called me to the room for coughing up blood-- but nosebleed on exam with blood in posterior OP, seems to be resolving, asked nursing to please use afrin when these occur. Giving it now. Pt states she's feeling fine now. Advised nursing to call if it doesn't resolve.  -11/19/23 no further epistaxis reported. Monitor.  10-9: Hemoglobin stable 7.5, stays between 7 and 8 and has had a few self-limited episodes of epistaxis recently.  No concerns on discharge.    LOS: 15 days A FACE TO FACE EVALUATION WAS PERFORMED  Joesph JAYSON Likes 11/23/2023, 8:58 AM

## 2023-11-23 NOTE — Progress Notes (Signed)
 Inpatient Rehabilitation Care Coordinator Discharge Note   Patient Details  Name: Courtney Cline MRN: [id number] Date of Birth: January 16, 1934   Discharge location: D/c to home  Length of Stay: 14 days  Discharge activity level: Supervision/CGA  Home/community participation: Limited  Patient response un:Yzjouy Literacy - How often do you need to have someone help you when you read instructions, pamphlets, or other written material from your doctor or pharmacy?: Never  Patient response un:Dnrpjo Isolation - How often do you feel lonely or isolated from those around you?: Never  Services provided included: MD, RD, PT, OT, SLP, RN, CM, Pharmacy, Neuropsych, SW, TR  Financial Services:  Field seismologist Utilized: Private Insurance [employer]  Choices offered to/list presented to: patient and pt son [name]  Follow-up services arranged:  Home Health, DME Home Health Agency: [facility] for HHPT/OT/SLP/aide    DME : [employer] [facility] for RW and [facility]    Patient response to transportation need: Is the patient able to respond to transportation needs?: Yes In the past 12 months, has lack of transportation kept you from medical appointments or from getting medications?: No In the past 12 months, has lack of transportation kept you from meetings, work, or from getting things needed for daily living?: No   Patient/Family verbalized understanding of follow-up arrangements:  Yes  Individual responsible for coordination of the follow-up plan: contact pt son [name]  Confirmed correct DME delivered: Graeme DELENA Jude 11/23/2023    Comments (or additional information):fam edu completed  Summary of Stay    Date/Time Discharge Planning CSW  11/20/23 1413 Pt will discharge to home with her husbnad, and support from son and DIL who will be moving into the home to assist if needed. Fam edu completed on 10/6 8am-11am. SW will confirm there are no barriers to discharge. AAC  11/14/23  1044 Pt will discharge to home with her husbnad, and support from son and DIL who will be moving into the home to assist if needed. SW will confirm there are no barriers to discharge. AAC       Koty Anctil A Jude

## 2023-11-23 NOTE — Plan of Care (Signed)
  Problem: RH Swallowing Goal: LTG Patient will consume least restrictive diet using compensatory strategies with assistance (SLP) Description: LTG:  Patient will consume least restrictive diet using compensatory strategies with assistance (SLP) Outcome: Completed/Met   Problem: RH Expression Communication Goal: LTG Patient will increase word finding of common (SLP) Description: LTG:  Patient will increase word finding of common objects/daily info/abstract thoughts with cues using compensatory strategies (SLP). Outcome: Completed/Met   Problem: RH Problem Solving Goal: LTG Patient will demonstrate problem solving for (SLP) Description: LTG:  Patient will demonstrate problem solving for basic/complex daily situations with cues  (SLP) Outcome: Completed/Met   Problem: RH Memory Goal: LTG Patient will demonstrate ability for day to day (SLP) Description: LTG:   Patient will demonstrate ability for day to day recall/carryover during cognitive/linguistic activities with assist  (SLP) Outcome: Completed/Met

## 2023-11-23 NOTE — Progress Notes (Addendum)
 Patient going home today advised son to bring own oxygen  for home.

## 2023-11-25 DIAGNOSIS — E785 Hyperlipidemia, unspecified: Secondary | ICD-10-CM | POA: Diagnosis not present

## 2023-11-25 DIAGNOSIS — I69311 Memory deficit following cerebral infarction: Secondary | ICD-10-CM | POA: Diagnosis not present

## 2023-11-25 DIAGNOSIS — I110 Hypertensive heart disease with heart failure: Secondary | ICD-10-CM | POA: Diagnosis not present

## 2023-11-25 DIAGNOSIS — G9341 Metabolic encephalopathy: Secondary | ICD-10-CM | POA: Diagnosis not present

## 2023-11-25 DIAGNOSIS — R29898 Other symptoms and signs involving the musculoskeletal system: Secondary | ICD-10-CM | POA: Diagnosis not present

## 2023-11-25 DIAGNOSIS — R1310 Dysphagia, unspecified: Secondary | ICD-10-CM | POA: Diagnosis not present

## 2023-11-25 DIAGNOSIS — M810 Age-related osteoporosis without current pathological fracture: Secondary | ICD-10-CM | POA: Diagnosis not present

## 2023-11-25 DIAGNOSIS — I5030 Unspecified diastolic (congestive) heart failure: Secondary | ICD-10-CM | POA: Diagnosis not present

## 2023-11-28 DIAGNOSIS — I69398 Other sequelae of cerebral infarction: Secondary | ICD-10-CM | POA: Diagnosis not present

## 2023-11-28 DIAGNOSIS — E785 Hyperlipidemia, unspecified: Secondary | ICD-10-CM | POA: Diagnosis not present

## 2023-11-28 DIAGNOSIS — I110 Hypertensive heart disease with heart failure: Secondary | ICD-10-CM | POA: Diagnosis not present

## 2023-11-28 DIAGNOSIS — R1310 Dysphagia, unspecified: Secondary | ICD-10-CM | POA: Diagnosis not present

## 2023-11-28 DIAGNOSIS — M810 Age-related osteoporosis without current pathological fracture: Secondary | ICD-10-CM | POA: Diagnosis not present

## 2023-11-28 DIAGNOSIS — G9341 Metabolic encephalopathy: Secondary | ICD-10-CM | POA: Diagnosis not present

## 2023-11-28 DIAGNOSIS — R29898 Other symptoms and signs involving the musculoskeletal system: Secondary | ICD-10-CM | POA: Diagnosis not present

## 2023-11-28 DIAGNOSIS — I69311 Memory deficit following cerebral infarction: Secondary | ICD-10-CM | POA: Diagnosis not present

## 2023-11-30 DIAGNOSIS — R1310 Dysphagia, unspecified: Secondary | ICD-10-CM | POA: Diagnosis not present

## 2023-11-30 DIAGNOSIS — E785 Hyperlipidemia, unspecified: Secondary | ICD-10-CM | POA: Diagnosis not present

## 2023-11-30 DIAGNOSIS — I5030 Unspecified diastolic (congestive) heart failure: Secondary | ICD-10-CM | POA: Diagnosis not present

## 2023-11-30 DIAGNOSIS — I69311 Memory deficit following cerebral infarction: Secondary | ICD-10-CM | POA: Diagnosis not present

## 2023-11-30 DIAGNOSIS — I110 Hypertensive heart disease with heart failure: Secondary | ICD-10-CM | POA: Diagnosis not present

## 2023-11-30 DIAGNOSIS — G9341 Metabolic encephalopathy: Secondary | ICD-10-CM | POA: Diagnosis not present

## 2023-11-30 DIAGNOSIS — M810 Age-related osteoporosis without current pathological fracture: Secondary | ICD-10-CM | POA: Diagnosis not present

## 2023-11-30 DIAGNOSIS — I69398 Other sequelae of cerebral infarction: Secondary | ICD-10-CM | POA: Diagnosis not present

## 2023-11-30 DIAGNOSIS — R29898 Other symptoms and signs involving the musculoskeletal system: Secondary | ICD-10-CM | POA: Diagnosis not present

## 2023-12-01 DIAGNOSIS — I69311 Memory deficit following cerebral infarction: Secondary | ICD-10-CM | POA: Diagnosis not present

## 2023-12-01 DIAGNOSIS — I69398 Other sequelae of cerebral infarction: Secondary | ICD-10-CM | POA: Diagnosis not present

## 2023-12-01 DIAGNOSIS — R1310 Dysphagia, unspecified: Secondary | ICD-10-CM | POA: Diagnosis not present

## 2023-12-01 DIAGNOSIS — M810 Age-related osteoporosis without current pathological fracture: Secondary | ICD-10-CM | POA: Diagnosis not present

## 2023-12-01 DIAGNOSIS — I5030 Unspecified diastolic (congestive) heart failure: Secondary | ICD-10-CM | POA: Diagnosis not present

## 2023-12-01 DIAGNOSIS — E785 Hyperlipidemia, unspecified: Secondary | ICD-10-CM | POA: Diagnosis not present

## 2023-12-01 DIAGNOSIS — G9341 Metabolic encephalopathy: Secondary | ICD-10-CM | POA: Diagnosis not present

## 2023-12-01 DIAGNOSIS — R29898 Other symptoms and signs involving the musculoskeletal system: Secondary | ICD-10-CM | POA: Diagnosis not present

## 2023-12-01 DIAGNOSIS — I110 Hypertensive heart disease with heart failure: Secondary | ICD-10-CM | POA: Diagnosis not present

## 2023-12-05 DIAGNOSIS — M810 Age-related osteoporosis without current pathological fracture: Secondary | ICD-10-CM | POA: Diagnosis not present

## 2023-12-05 DIAGNOSIS — I69398 Other sequelae of cerebral infarction: Secondary | ICD-10-CM | POA: Diagnosis not present

## 2023-12-05 DIAGNOSIS — R1310 Dysphagia, unspecified: Secondary | ICD-10-CM | POA: Diagnosis not present

## 2023-12-05 DIAGNOSIS — I110 Hypertensive heart disease with heart failure: Secondary | ICD-10-CM | POA: Diagnosis not present

## 2023-12-05 DIAGNOSIS — I5030 Unspecified diastolic (congestive) heart failure: Secondary | ICD-10-CM | POA: Diagnosis not present

## 2023-12-05 DIAGNOSIS — I69311 Memory deficit following cerebral infarction: Secondary | ICD-10-CM | POA: Diagnosis not present

## 2023-12-05 DIAGNOSIS — R29898 Other symptoms and signs involving the musculoskeletal system: Secondary | ICD-10-CM | POA: Diagnosis not present

## 2023-12-05 DIAGNOSIS — G9341 Metabolic encephalopathy: Secondary | ICD-10-CM | POA: Diagnosis not present

## 2023-12-05 DIAGNOSIS — E785 Hyperlipidemia, unspecified: Secondary | ICD-10-CM | POA: Diagnosis not present

## 2023-12-06 ENCOUNTER — Telehealth: Payer: Self-pay | Admitting: Cardiovascular Disease

## 2023-12-06 NOTE — Telephone Encounter (Signed)
 Called son [name] per request of Signe with [facility] HH/PT. Son [name] reports patients appetite has increased, taking stimulant. Son reports a little swelling of ankles. Does not wear compression stockings. Keeps legs elevated some. May have increased sodium intake with soups, for example, with poor dentition and h/o stroke (unable to prepare foods) [name] reports SOB at baseline (COPD) but has not progressed.   Patient unable to walk d/t stroke. Cannot weigh daily.   Sons are caring for patient and their elderly father also. Son [name] said they will start doing daily weights.   Advised should contact office for weight gain of 3lbs in 24 hours or 5lbs in 1 week  Echo with G2DD. NL LVEF  Per chart review, bilateral LE edema is not new -- noted in prior MD notes.   From conversation, son did not express acute concern. Unsure if PRN lasix  would be advised. Will send to MD

## 2023-12-06 NOTE — Telephone Encounter (Signed)
 Pt c/o swelling/edema: STAT if pt has developed SOB within 24 hours  If swelling, where is the swelling located? legs  How much weight have you gained and in what time span? 3.6 pounds in 6 days  Have you gained 2 pounds in a day or 5 pounds in a week? yes  Do you have a log of your daily weights (if so, list)? He said she is not good at keeping log 154.6, 151.0   Are you currently taking a fluid pill? yes  Are you currently SOB? no  Have you traveled recently in a car or plane for an extended period of time? No Lung congestion, bilat leg edema +1  Please call one of her son's:        [name] [phone]  [name] Raddle [phone]

## 2023-12-07 DIAGNOSIS — M810 Age-related osteoporosis without current pathological fracture: Secondary | ICD-10-CM | POA: Diagnosis not present

## 2023-12-07 DIAGNOSIS — I69311 Memory deficit following cerebral infarction: Secondary | ICD-10-CM | POA: Diagnosis not present

## 2023-12-07 DIAGNOSIS — G9341 Metabolic encephalopathy: Secondary | ICD-10-CM | POA: Diagnosis not present

## 2023-12-07 DIAGNOSIS — E785 Hyperlipidemia, unspecified: Secondary | ICD-10-CM | POA: Diagnosis not present

## 2023-12-07 DIAGNOSIS — R1310 Dysphagia, unspecified: Secondary | ICD-10-CM | POA: Diagnosis not present

## 2023-12-07 DIAGNOSIS — I5030 Unspecified diastolic (congestive) heart failure: Secondary | ICD-10-CM | POA: Diagnosis not present

## 2023-12-07 DIAGNOSIS — I110 Hypertensive heart disease with heart failure: Secondary | ICD-10-CM | POA: Diagnosis not present

## 2023-12-07 DIAGNOSIS — R29898 Other symptoms and signs involving the musculoskeletal system: Secondary | ICD-10-CM | POA: Diagnosis not present

## 2023-12-07 DIAGNOSIS — I69398 Other sequelae of cerebral infarction: Secondary | ICD-10-CM | POA: Diagnosis not present

## 2023-12-08 NOTE — Telephone Encounter (Signed)
 Update sent to patient/sons via MyChart  Court Dorn PARAS, MD to Me     12/07/23  7:12 AM Thx for the update. Agree with current Rx.

## 2023-12-12 DIAGNOSIS — I110 Hypertensive heart disease with heart failure: Secondary | ICD-10-CM | POA: Diagnosis not present

## 2023-12-12 DIAGNOSIS — R1310 Dysphagia, unspecified: Secondary | ICD-10-CM | POA: Diagnosis not present

## 2023-12-12 DIAGNOSIS — E785 Hyperlipidemia, unspecified: Secondary | ICD-10-CM | POA: Diagnosis not present

## 2023-12-12 DIAGNOSIS — G9341 Metabolic encephalopathy: Secondary | ICD-10-CM | POA: Diagnosis not present

## 2023-12-12 DIAGNOSIS — I5030 Unspecified diastolic (congestive) heart failure: Secondary | ICD-10-CM | POA: Diagnosis not present

## 2023-12-12 DIAGNOSIS — M810 Age-related osteoporosis without current pathological fracture: Secondary | ICD-10-CM | POA: Diagnosis not present

## 2023-12-12 DIAGNOSIS — R29898 Other symptoms and signs involving the musculoskeletal system: Secondary | ICD-10-CM | POA: Diagnosis not present

## 2023-12-12 DIAGNOSIS — I69398 Other sequelae of cerebral infarction: Secondary | ICD-10-CM | POA: Diagnosis not present

## 2023-12-12 DIAGNOSIS — I69311 Memory deficit following cerebral infarction: Secondary | ICD-10-CM | POA: Diagnosis not present

## 2023-12-13 DIAGNOSIS — R1310 Dysphagia, unspecified: Secondary | ICD-10-CM | POA: Diagnosis not present

## 2023-12-13 DIAGNOSIS — G9341 Metabolic encephalopathy: Secondary | ICD-10-CM | POA: Diagnosis not present

## 2023-12-13 DIAGNOSIS — I69398 Other sequelae of cerebral infarction: Secondary | ICD-10-CM | POA: Diagnosis not present

## 2023-12-13 DIAGNOSIS — I69311 Memory deficit following cerebral infarction: Secondary | ICD-10-CM | POA: Diagnosis not present

## 2023-12-13 DIAGNOSIS — I5030 Unspecified diastolic (congestive) heart failure: Secondary | ICD-10-CM | POA: Diagnosis not present

## 2023-12-13 DIAGNOSIS — M810 Age-related osteoporosis without current pathological fracture: Secondary | ICD-10-CM | POA: Diagnosis not present

## 2023-12-13 DIAGNOSIS — I110 Hypertensive heart disease with heart failure: Secondary | ICD-10-CM | POA: Diagnosis not present

## 2023-12-13 DIAGNOSIS — E785 Hyperlipidemia, unspecified: Secondary | ICD-10-CM | POA: Diagnosis not present

## 2023-12-13 DIAGNOSIS — R29898 Other symptoms and signs involving the musculoskeletal system: Secondary | ICD-10-CM | POA: Diagnosis not present

## 2023-12-14 ENCOUNTER — Other Ambulatory Visit (HOSPITAL_COMMUNITY): Payer: Self-pay

## 2023-12-15 DIAGNOSIS — R29898 Other symptoms and signs involving the musculoskeletal system: Secondary | ICD-10-CM | POA: Diagnosis not present

## 2023-12-15 DIAGNOSIS — R1310 Dysphagia, unspecified: Secondary | ICD-10-CM | POA: Diagnosis not present

## 2023-12-15 DIAGNOSIS — I69311 Memory deficit following cerebral infarction: Secondary | ICD-10-CM | POA: Diagnosis not present

## 2023-12-15 DIAGNOSIS — I69398 Other sequelae of cerebral infarction: Secondary | ICD-10-CM | POA: Diagnosis not present

## 2023-12-15 DIAGNOSIS — I110 Hypertensive heart disease with heart failure: Secondary | ICD-10-CM | POA: Diagnosis not present

## 2023-12-15 DIAGNOSIS — M810 Age-related osteoporosis without current pathological fracture: Secondary | ICD-10-CM | POA: Diagnosis not present

## 2023-12-15 DIAGNOSIS — G9341 Metabolic encephalopathy: Secondary | ICD-10-CM | POA: Diagnosis not present

## 2023-12-15 DIAGNOSIS — I5030 Unspecified diastolic (congestive) heart failure: Secondary | ICD-10-CM | POA: Diagnosis not present

## 2023-12-15 DIAGNOSIS — E785 Hyperlipidemia, unspecified: Secondary | ICD-10-CM | POA: Diagnosis not present

## 2023-12-20 ENCOUNTER — Inpatient Hospital Stay

## 2023-12-21 ENCOUNTER — Inpatient Hospital Stay (HOSPITAL_COMMUNITY)
Admission: EM | Admit: 2023-12-21 | Discharge: 2024-01-01 | DRG: 871 | Disposition: A | Discharge status: Skilled Nursing Facility | Attending: Emergency Medicine | Admitting: Emergency Medicine

## 2023-12-21 ENCOUNTER — Emergency Department (HOSPITAL_COMMUNITY)

## 2023-12-21 ENCOUNTER — Other Ambulatory Visit: Payer: Self-pay

## 2023-12-21 ENCOUNTER — Encounter (HOSPITAL_COMMUNITY): Payer: Self-pay

## 2023-12-21 DIAGNOSIS — J9621 Acute and chronic respiratory failure with hypoxia: Secondary | ICD-10-CM | POA: Diagnosis present

## 2023-12-21 DIAGNOSIS — E785 Hyperlipidemia, unspecified: Secondary | ICD-10-CM | POA: Diagnosis present

## 2023-12-21 DIAGNOSIS — I700 Atherosclerosis of aorta: Secondary | ICD-10-CM | POA: Diagnosis not present

## 2023-12-21 DIAGNOSIS — Z7901 Long term (current) use of anticoagulants: Secondary | ICD-10-CM

## 2023-12-21 DIAGNOSIS — Z743 Need for continuous supervision: Secondary | ICD-10-CM | POA: Diagnosis not present

## 2023-12-21 DIAGNOSIS — N1832 Chronic kidney disease, stage 3b: Secondary | ICD-10-CM | POA: Diagnosis not present

## 2023-12-21 DIAGNOSIS — K219 Gastro-esophageal reflux disease without esophagitis: Secondary | ICD-10-CM | POA: Diagnosis present

## 2023-12-21 DIAGNOSIS — I2510 Atherosclerotic heart disease of native coronary artery without angina pectoris: Secondary | ICD-10-CM | POA: Diagnosis present

## 2023-12-21 DIAGNOSIS — J440 Chronic obstructive pulmonary disease with acute lower respiratory infection: Secondary | ICD-10-CM | POA: Diagnosis present

## 2023-12-21 DIAGNOSIS — Z825 Family history of asthma and other chronic lower respiratory diseases: Secondary | ICD-10-CM

## 2023-12-21 DIAGNOSIS — Z87891 Personal history of nicotine dependence: Secondary | ICD-10-CM

## 2023-12-21 DIAGNOSIS — Z741 Need for assistance with personal care: Secondary | ICD-10-CM | POA: Diagnosis not present

## 2023-12-21 DIAGNOSIS — I4819 Other persistent atrial fibrillation: Secondary | ICD-10-CM | POA: Diagnosis present

## 2023-12-21 DIAGNOSIS — I4891 Unspecified atrial fibrillation: Secondary | ICD-10-CM | POA: Diagnosis not present

## 2023-12-21 DIAGNOSIS — I130 Hypertensive heart and chronic kidney disease with heart failure and stage 1 through stage 4 chronic kidney disease, or unspecified chronic kidney disease: Secondary | ICD-10-CM | POA: Diagnosis not present

## 2023-12-21 DIAGNOSIS — E782 Mixed hyperlipidemia: Secondary | ICD-10-CM | POA: Diagnosis not present

## 2023-12-21 DIAGNOSIS — I10 Essential (primary) hypertension: Secondary | ICD-10-CM | POA: Diagnosis not present

## 2023-12-21 DIAGNOSIS — Z853 Personal history of malignant neoplasm of breast: Secondary | ICD-10-CM

## 2023-12-21 DIAGNOSIS — I5033 Acute on chronic diastolic (congestive) heart failure: Secondary | ICD-10-CM | POA: Diagnosis present

## 2023-12-21 DIAGNOSIS — E876 Hypokalemia: Secondary | ICD-10-CM | POA: Diagnosis not present

## 2023-12-21 DIAGNOSIS — D649 Anemia, unspecified: Secondary | ICD-10-CM | POA: Diagnosis not present

## 2023-12-21 DIAGNOSIS — N179 Acute kidney failure, unspecified: Secondary | ICD-10-CM | POA: Diagnosis not present

## 2023-12-21 DIAGNOSIS — R1314 Dysphagia, pharyngoesophageal phase: Secondary | ICD-10-CM | POA: Diagnosis present

## 2023-12-21 DIAGNOSIS — I480 Paroxysmal atrial fibrillation: Secondary | ICD-10-CM | POA: Diagnosis not present

## 2023-12-21 DIAGNOSIS — D631 Anemia in chronic kidney disease: Secondary | ICD-10-CM | POA: Diagnosis present

## 2023-12-21 DIAGNOSIS — R6520 Severe sepsis without septic shock: Secondary | ICD-10-CM | POA: Diagnosis present

## 2023-12-21 DIAGNOSIS — I5030 Unspecified diastolic (congestive) heart failure: Secondary | ICD-10-CM | POA: Diagnosis present

## 2023-12-21 DIAGNOSIS — R1312 Dysphagia, oropharyngeal phase: Secondary | ICD-10-CM | POA: Diagnosis not present

## 2023-12-21 DIAGNOSIS — R0989 Other specified symptoms and signs involving the circulatory and respiratory systems: Secondary | ICD-10-CM | POA: Diagnosis not present

## 2023-12-21 DIAGNOSIS — I5032 Chronic diastolic (congestive) heart failure: Secondary | ICD-10-CM

## 2023-12-21 DIAGNOSIS — J441 Chronic obstructive pulmonary disease with (acute) exacerbation: Secondary | ICD-10-CM | POA: Diagnosis not present

## 2023-12-21 DIAGNOSIS — Z8673 Personal history of transient ischemic attack (TIA), and cerebral infarction without residual deficits: Secondary | ICD-10-CM

## 2023-12-21 DIAGNOSIS — J690 Pneumonitis due to inhalation of food and vomit: Secondary | ICD-10-CM | POA: Diagnosis present

## 2023-12-21 DIAGNOSIS — A419 Sepsis, unspecified organism: Secondary | ICD-10-CM | POA: Diagnosis not present

## 2023-12-21 DIAGNOSIS — C50211 Malignant neoplasm of upper-inner quadrant of right female breast: Secondary | ICD-10-CM | POA: Diagnosis present

## 2023-12-21 DIAGNOSIS — J90 Pleural effusion, not elsewhere classified: Secondary | ICD-10-CM

## 2023-12-21 DIAGNOSIS — R509 Fever, unspecified: Secondary | ICD-10-CM | POA: Diagnosis not present

## 2023-12-21 DIAGNOSIS — R918 Other nonspecific abnormal finding of lung field: Secondary | ICD-10-CM | POA: Diagnosis not present

## 2023-12-21 DIAGNOSIS — M6281 Muscle weakness (generalized): Secondary | ICD-10-CM | POA: Diagnosis not present

## 2023-12-21 DIAGNOSIS — J449 Chronic obstructive pulmonary disease, unspecified: Secondary | ICD-10-CM | POA: Diagnosis not present

## 2023-12-21 DIAGNOSIS — Z9981 Dependence on supplemental oxygen: Secondary | ICD-10-CM

## 2023-12-21 DIAGNOSIS — I959 Hypotension, unspecified: Secondary | ICD-10-CM | POA: Diagnosis not present

## 2023-12-21 DIAGNOSIS — Z66 Do not resuscitate: Secondary | ICD-10-CM | POA: Diagnosis present

## 2023-12-21 DIAGNOSIS — Z7951 Long term (current) use of inhaled steroids: Secondary | ICD-10-CM

## 2023-12-21 DIAGNOSIS — R2681 Unsteadiness on feet: Secondary | ICD-10-CM | POA: Diagnosis not present

## 2023-12-21 DIAGNOSIS — J9611 Chronic respiratory failure with hypoxia: Secondary | ICD-10-CM | POA: Diagnosis not present

## 2023-12-21 DIAGNOSIS — E440 Moderate protein-calorie malnutrition: Secondary | ICD-10-CM | POA: Diagnosis not present

## 2023-12-21 DIAGNOSIS — Z6829 Body mass index (BMI) 29.0-29.9, adult: Secondary | ICD-10-CM

## 2023-12-21 DIAGNOSIS — E669 Obesity, unspecified: Secondary | ICD-10-CM | POA: Diagnosis present

## 2023-12-21 DIAGNOSIS — Z79899 Other long term (current) drug therapy: Secondary | ICD-10-CM

## 2023-12-21 DIAGNOSIS — J189 Pneumonia, unspecified organism: Secondary | ICD-10-CM | POA: Diagnosis not present

## 2023-12-21 DIAGNOSIS — Z1152 Encounter for screening for COVID-19: Secondary | ICD-10-CM

## 2023-12-21 DIAGNOSIS — J9601 Acute respiratory failure with hypoxia: Secondary | ICD-10-CM | POA: Diagnosis not present

## 2023-12-21 DIAGNOSIS — J439 Emphysema, unspecified: Secondary | ICD-10-CM | POA: Diagnosis not present

## 2023-12-21 DIAGNOSIS — M8580 Other specified disorders of bone density and structure, unspecified site: Secondary | ICD-10-CM | POA: Diagnosis present

## 2023-12-21 DIAGNOSIS — R0602 Shortness of breath: Secondary | ICD-10-CM | POA: Diagnosis not present

## 2023-12-21 HISTORY — DX: Acute cystitis without hematuria: N30.00

## 2023-12-21 LAB — COMPREHENSIVE METABOLIC PANEL WITH GFR
ALT: 17 U/L (ref 0–44)
AST: 26 U/L (ref 15–41)
Albumin: 2.7 g/dL — ABNORMAL LOW (ref 3.5–5.0)
Alkaline Phosphatase: 50 U/L (ref 38–126)
Anion gap: 13 (ref 5–15)
BUN: 18 mg/dL (ref 8–23)
CO2: 24 mmol/L (ref 22–32)
Calcium: 8.9 mg/dL (ref 8.9–10.3)
Chloride: 105 mmol/L (ref 98–111)
Creatinine, Ser: 1.68 mg/dL — ABNORMAL HIGH (ref 0.44–1.00)
GFR, Estimated: 29 mL/min — ABNORMAL LOW (ref >60)
Glucose, Bld: 125 mg/dL — ABNORMAL HIGH (ref 70–99)
Potassium: 4.6 mmol/L (ref 3.5–5.1)
Sodium: 142 mmol/L (ref 135–145)
Total Bilirubin: 0.8 mg/dL (ref 0.0–1.2)
Total Protein: 7 g/dL (ref 6.5–8.1)

## 2023-12-21 LAB — CBC WITH DIFFERENTIAL/PLATELET
Abs Immature Granulocytes: 0.05 K/uL (ref 0.00–0.07)
Basophils Absolute: 0 K/uL (ref 0.0–0.1)
Basophils Relative: 0 %
Eosinophils Absolute: 0.3 K/uL (ref 0.0–0.5)
Eosinophils Relative: 2 %
HCT: 28.3 % — ABNORMAL LOW (ref 36.0–46.0)
Hemoglobin: 8.6 g/dL — ABNORMAL LOW (ref 12.0–15.0)
Immature Granulocytes: 0 %
Lymphocytes Relative: 10 %
Lymphs Abs: 1.5 K/uL (ref 0.7–4.0)
MCH: 29.9 pg (ref 26.0–34.0)
MCHC: 30.4 g/dL (ref 30.0–36.0)
MCV: 98.3 fL (ref 80.0–100.0)
Monocytes Absolute: 0.7 K/uL (ref 0.1–1.0)
Monocytes Relative: 5 %
Neutro Abs: 12.2 K/uL — ABNORMAL HIGH (ref 1.7–7.7)
Neutrophils Relative %: 83 %
Platelets: 393 K/uL (ref 150–400)
RBC: 2.88 MIL/uL — ABNORMAL LOW (ref 3.87–5.11)
RDW: 15.8 % — ABNORMAL HIGH (ref 11.5–15.5)
WBC: 14.8 K/uL — ABNORMAL HIGH (ref 4.0–10.5)
nRBC: 0 % (ref 0.0–0.2)

## 2023-12-21 LAB — RESP PANEL BY RT-PCR (RSV, FLU A&B, COVID)  RVPGX2
Influenza A by PCR: NEGATIVE
Influenza B by PCR: NEGATIVE
Resp Syncytial Virus by PCR: NEGATIVE
SARS Coronavirus 2 by RT PCR: NEGATIVE

## 2023-12-21 LAB — MAGNESIUM: Magnesium: 2.1 mg/dL (ref 1.7–2.4)

## 2023-12-21 LAB — I-STAT CG4 LACTIC ACID, ED
Lactic Acid, Venous: 2.7 mmol/L (ref 0.5–1.9)
Lactic Acid, Venous: 1 mmol/L (ref 0.5–1.9)

## 2023-12-21 LAB — PROTIME-INR
INR: 1.4 — ABNORMAL HIGH (ref 0.8–1.2)
Prothrombin Time: 17.7 s — ABNORMAL HIGH (ref 11.4–15.2)

## 2023-12-21 MED ORDER — MIRTAZAPINE 15 MG PO TABS
7.5000 mg | ORAL_TABLET | Freq: Every day | ORAL | Status: DC
  Administered 2023-12-21 – 2023-12-31 (×11): 7.5 mg via ORAL
  Filled 2023-12-21 (×11): qty 1

## 2023-12-21 MED ORDER — LACTATED RINGERS IV SOLN: INTRAVENOUS | Status: AC

## 2023-12-21 MED ORDER — APIXABAN 2.5 MG PO TABS
2.5000 mg | ORAL_TABLET | Freq: Two times a day (BID) | ORAL | Status: DC
  Administered 2023-12-21 – 2024-01-01 (×22): 2.5 mg via ORAL
  Filled 2023-12-21 (×22): qty 1

## 2023-12-21 MED ORDER — ALBUTEROL SULFATE (2.5 MG/3ML) 0.083% IN NEBU
2.5000 mg | INHALATION_SOLUTION | RESPIRATORY_TRACT | Status: DC | PRN
  Administered 2023-12-21 – 2023-12-22 (×2): 2.5 mg via RESPIRATORY_TRACT
  Filled 2023-12-21 (×2): qty 3

## 2023-12-21 MED ORDER — ATORVASTATIN CALCIUM 80 MG PO TABS
80.0000 mg | ORAL_TABLET | Freq: Every day | ORAL | Status: DC
  Administered 2023-12-22 – 2024-01-01 (×11): 80 mg via ORAL
  Filled 2023-12-21 (×11): qty 1

## 2023-12-21 MED ORDER — LACTATED RINGERS IV BOLUS (SEPSIS)
1000.0000 mL | Freq: Once | INTRAVENOUS | Status: AC
  Administered 2023-12-21: 1000 mL via INTRAVENOUS

## 2023-12-21 MED ORDER — SODIUM CHLORIDE 0.9 % IV SOLN
500.0000 mg | Freq: Once | INTRAVENOUS | Status: AC
  Administered 2023-12-21: 500 mg via INTRAVENOUS
  Filled 2023-12-21: qty 5

## 2023-12-21 MED ORDER — ACETAMINOPHEN 500 MG PO TABS
1000.0000 mg | ORAL_TABLET | Freq: Once | ORAL | Status: AC
  Administered 2023-12-21: 1000 mg via ORAL
  Filled 2023-12-21: qty 2

## 2023-12-21 MED ORDER — PANTOPRAZOLE SODIUM 40 MG PO TBEC
40.0000 mg | DELAYED_RELEASE_TABLET | Freq: Two times a day (BID) | ORAL | Status: DC
  Administered 2023-12-21 – 2024-01-01 (×22): 40 mg via ORAL
  Filled 2023-12-21 (×21): qty 1

## 2023-12-21 MED ORDER — METOPROLOL TARTRATE 25 MG PO TABS
25.0000 mg | ORAL_TABLET | Freq: Two times a day (BID) | ORAL | Status: DC
  Administered 2023-12-21 – 2023-12-27 (×12): 25 mg via ORAL
  Filled 2023-12-21 (×12): qty 1

## 2023-12-21 MED ORDER — BUDESON-GLYCOPYRROL-FORMOTEROL 160-9-4.8 MCG/ACT IN AERO
2.0000 | INHALATION_SPRAY | Freq: Two times a day (BID) | RESPIRATORY_TRACT | Status: DC
  Administered 2023-12-22 – 2024-01-01 (×21): 2 via RESPIRATORY_TRACT
  Filled 2023-12-21: qty 5.9

## 2023-12-21 MED ORDER — SODIUM CHLORIDE 0.9 % IV SOLN
2.0000 g | Freq: Once | INTRAVENOUS | Status: AC
  Administered 2023-12-21: 2 g via INTRAVENOUS
  Filled 2023-12-21: qty 20

## 2023-12-21 MED ORDER — LACTATED RINGERS IV BOLUS (SEPSIS): 500.0000 mL | Freq: Once | INTRAVENOUS | Status: DC

## 2023-12-21 MED ORDER — SODIUM CHLORIDE 0.9 % IV SOLN
500.0000 mg | INTRAVENOUS | Status: DC
  Administered 2023-12-22 – 2023-12-24 (×3): 500 mg via INTRAVENOUS
  Filled 2023-12-21 (×3): qty 5

## 2023-12-21 MED ORDER — SODIUM CHLORIDE 0.9 % IV SOLN
2.0000 g | INTRAVENOUS | Status: DC
  Administered 2023-12-22 – 2023-12-24 (×3): 2 g via INTRAVENOUS
  Filled 2023-12-21 (×3): qty 20

## 2023-12-21 MED ORDER — ACETAMINOPHEN 325 MG PO TABS
650.0000 mg | ORAL_TABLET | Freq: Four times a day (QID) | ORAL | Status: DC | PRN
  Administered 2023-12-31: 650 mg via ORAL
  Filled 2023-12-21: qty 2

## 2023-12-21 MED ORDER — SODIUM CHLORIDE 0.9% FLUSH
3.0000 mL | Freq: Two times a day (BID) | INTRAVENOUS | Status: DC
  Administered 2023-12-21 – 2024-01-01 (×20): 3 mL via INTRAVENOUS

## 2023-12-21 MED ORDER — POLYETHYLENE GLYCOL 3350 17 G PO PACK: 17.0000 g | PACK | Freq: Every day | ORAL | Status: DC | PRN

## 2023-12-21 MED ORDER — LACTATED RINGERS IV BOLUS
500.0000 mL | Freq: Once | INTRAVENOUS | Status: AC
  Administered 2023-12-21: 500 mL via INTRAVENOUS

## 2023-12-21 MED ORDER — MEGESTROL ACETATE 400 MG/10ML PO SUSP
200.0000 mg | Freq: Two times a day (BID) | ORAL | Status: DC
  Filled 2023-12-21 (×3): qty 5

## 2023-12-21 MED ORDER — ACETAMINOPHEN 650 MG RE SUPP
650.0000 mg | Freq: Four times a day (QID) | RECTAL | Status: DC | PRN
Start: 2023-12-21 — End: 2024-01-01

## 2023-12-21 NOTE — ED Notes (Signed)
 ED TO INPATIENT HANDOFF REPORT  ED Nurse Name and Phone #:   S Name/Age/Gender Courtney Cline 89 y.o. female Room/Bed: 029C/029C  Code Status   Code Status: Limited: Do not attempt resuscitation (DNR) -DNR-LIMITED -Do Not Intubate/DNI   Home/SNF/Other Home Patient oriented to: self, place, time, and situation Is this baseline? Yes   Triage Complete: Triage complete  Chief Complaint Sepsis [facility]) [A41.9]  Triage Note Pt to er room number 29 via ems, per ems pt is here for a fib with rvr with a rate around 200, pt presents with a fib with a rate of 146, pt awake and c/o sob, pt denies pain.     Allergies No Known Allergies  Level of Care/Admitting Diagnosis ED Disposition     ED Disposition  Admit   Condition  --   Comment  Hospital Area: [facility] [[id number]]  Level of Care: Progressive [102]  Admit to Progressive based on following criteria: CARDIOVASCULAR & THORACIC of moderate stability with acute coronary syndrome symptoms/low risk myocardial infarction/hypertensive urgency/arrhythmias/heart failure potentially compromising stability and stable post cardiovascular intervention patients.  Admit to Progressive based on following criteria: RESPIRATORY PROBLEMS hypoxemic/hypercapnic respiratory failure that is responsive to NIPPV (BiPAP) or High Flow Nasal Cannula (6-80 lpm). Frequent assessment/intervention, no > Q2 hrs < Q4 hrs, to maintain oxygenation and pulmonary hygiene.  Admit to Progressive based on following criteria: MULTISYSTEM THREATS such as stable sepsis, metabolic/electrolyte imbalance with or without encephalopathy that is responding to early treatment.  May admit patient to [facility] or [facility] if equivalent level of care is available:: No  Diagnosis: Sepsis [facility]) [[id number]]  Admitting Physician: MELVIN, ALEXANDER B [[id number]]  Attending Physician: MELVIN, ALEXANDER B [[id number]]  Certification:: I certify this patient will need  inpatient services for at least 2 midnights  Expected Medical Readiness: 12/23/2023          B Medical/Surgery History Past Medical History:  Diagnosis Date   Acute cystitis without hematuria    Acute metabolic encephalopathy 11/04/2023   AKI (acute kidney injury) 02/15/2021   Allergic rhinitis    Arthritis    Breast calcification seen on mammogram    Right breast   Breast cancer (HCC)    COPD (chronic obstructive pulmonary disease) (HCC)    GERD (gastroesophageal reflux disease)    Hyperglycemia    Hyperlipidemia    Hypertension    Low back pain    On home oxygen  therapy    all the time   Osteopenia    Peripheral edema    Shortness of breath    Subclavian artery stenosis, left    Vitamin D  deficiency    Wears dentures    top   Wears glasses    reading   Past Surgical History:  Procedure Laterality Date   BREAST BIOPSY     BREAST LUMPECTOMY     right 2015   BREAST LUMPECTOMY WITH RADIOACTIVE SEED LOCALIZATION Right 12/20/2013   Procedure: RIGHT BREAST LUMPECTOMY WITH RADIOACTIVE SEED LOCALIZATION;  Surgeon: Debby Shipper, MD;  Location: [facility];  Service: General;  Laterality: Right;   CAROTID DUPLEX  2014   CATARACT EXTRACTION     both eyes     A IV Location/Drains/Wounds Patient Lines/Drains/Airways Status     Active Line/Drains/Airways     Name Placement date Placement time Site Days   Peripheral IV 12/21/23 20 G Anterior;Proximal;Right Forearm 12/21/23  1149  Forearm  less than 1   Peripheral IV 12/21/23 20  G Anterior;Left;Proximal Forearm 12/21/23  1201  Forearm  less than 1   Wound 11/11/23 1800 Other (Comment) Pretibial Right;Anterior;Mid 11/11/23  1800  Pretibial  40            Intake/Output Last 24 hours  Intake/Output Summary (Last 24 hours) at 12/21/2023 1907 Last data filed at 12/21/2023 1313 Gross per 24 hour  Intake 347.78 ml  Output --  Net 347.78 ml    Labs/Imaging Results for orders placed or performed  during the hospital encounter of 12/21/23 (from the past 48 hours)  Resp panel by RT-PCR (RSV, Flu A&B, Covid) Anterior Nasal Swab     Status: None   Collection Time: 12/21/23 11:39 AM   Specimen: Anterior Nasal Swab  Result Value Ref Range   SARS Coronavirus 2 by RT PCR NEGATIVE NEGATIVE   Influenza A by PCR NEGATIVE NEGATIVE   Influenza B by PCR NEGATIVE NEGATIVE    Comment: (NOTE) The Xpert Xpress SARS-CoV-2/FLU/RSV plus assay is intended as an aid in the diagnosis of influenza from Nasopharyngeal swab specimens and should not be used as a sole basis for treatment. Nasal washings and aspirates are unacceptable for Xpert Xpress SARS-CoV-2/FLU/RSV testing.  Fact Sheet for Patients: bloggercourse.com  Fact Sheet for Healthcare Providers: seriousbroker.it  This test is not yet approved or cleared by the [place]  FDA and has been authorized for detection and/or diagnosis of SARS-CoV-2 by FDA under an Emergency Use Authorization (EUA). This EUA will remain in effect (meaning this test can be used) for the duration of the COVID-19 declaration under Section 564(b)(1) of the Act, 21 U.S.C. section 360bbb-3(b)(1), unless the authorization is terminated or revoked.     Resp Syncytial Virus by PCR NEGATIVE NEGATIVE    Comment: (NOTE) Fact Sheet for Patients: bloggercourse.com  Fact Sheet for Healthcare Providers: seriousbroker.it  This test is not yet approved or cleared by the [place]  FDA and has been authorized for detection and/or diagnosis of SARS-CoV-2 by FDA under an Emergency Use Authorization (EUA). This EUA will remain in effect (meaning this test can be used) for the duration of the COVID-19 declaration under Section 564(b)(1) of the Act, 21 U.S.C. section 360bbb-3(b)(1), unless the authorization is terminated or revoked.  Performed at [facility],  [address], [place], [name] [address]   Comprehensive metabolic panel     Status: Abnormal   Collection Time: 12/21/23 11:39 AM  Result Value Ref Range   Sodium 142 135 - 145 mmol/L   Potassium 4.6 3.5 - 5.1 mmol/L   Chloride 105 98 - 111 mmol/L   CO2 24 22 - 32 mmol/L   Glucose, Bld 125 (H) 70 - 99 mg/dL    Comment: Glucose reference range applies only to samples taken after fasting for at least 8 hours.   BUN 18 8 - 23 mg/dL   Creatinine, Ser 8.31 (H) 0.44 - 1.00 mg/dL   Calcium  8.9 8.9 - 10.3 mg/dL   Total Protein 7.0 6.5 - 8.1 g/dL   Albumin 2.7 (L) 3.5 - 5.0 g/dL   AST 26 15 - 41 U/L   ALT 17 0 - 44 U/L   Alkaline Phosphatase 50 38 - 126 U/L   Total Bilirubin 0.8 0.0 - 1.2 mg/dL   GFR, Estimated 29 (L) >60 mL/min    Comment: (NOTE) Calculated using the CKD-EPI Creatinine Equation (2021)    Anion gap 13 5 - 15    Comment: Performed at [facility], [address] [place], [name] [address]  CBC with Differential     Status: Abnormal   Collection Time: 12/21/23 11:39 AM  Result Value Ref Range   WBC 14.8 (H) 4.0 - 10.5 K/uL   RBC 2.88 (L) 3.87 - 5.11 MIL/uL   Hemoglobin 8.6 (L) 12.0 - 15.0 g/dL   HCT 71.6 (L) 63.9 - 53.9 %   MCV 98.3 80.0 - 100.0 fL   MCH 29.9 26.0 - 34.0 pg   MCHC 30.4 30.0 - 36.0 g/dL   RDW 84.1 (H) 88.4 - 84.4 %   Platelets 393 150 - 400 K/uL   nRBC 0.0 0.0 - 0.2 %   Neutrophils Relative % 83 %   Neutro Abs 12.2 (H) 1.7 - 7.7 K/uL   Lymphocytes Relative 10 %   Lymphs Abs 1.5 0.7 - 4.0 K/uL   Monocytes Relative 5 %   Monocytes Absolute 0.7 0.1 - 1.0 K/uL   Eosinophils Relative 2 %   Eosinophils Absolute 0.3 0.0 - 0.5 K/uL   Basophils Relative 0 %   Basophils Absolute 0.0 0.0 - 0.1 K/uL   Immature Granulocytes 0 %   Abs Immature Granulocytes 0.05 0.00 - 0.07 K/uL    Comment: Performed at [facility], [address] [place], [name] [address]  Protime-INR     Status: Abnormal   Collection Time: 12/21/23 11:39 AM  Result Value  Ref Range   Prothrombin Time 17.7 (H) 11.4 - 15.2 seconds   INR 1.4 (H) 0.8 - 1.2    Comment: (NOTE) INR goal varies based on device and disease states. Performed at [facility], [address] [place], [name] [address]   Magnesium     Status: None   Collection Time: 12/21/23 11:39 AM  Result Value Ref Range   Magnesium 2.1 1.7 - 2.4 mg/dL    Comment: Performed at [facility], [address], [place], [name] [address]  I-Stat Lactic Acid, ED     Status: Abnormal   Collection Time: 12/21/23 12:11 PM  Result Value Ref Range   Lactic Acid, Venous 2.7 (HH) 0.5 - 1.9 mmol/L   Comment NOTIFIED PHYSICIAN   I-Stat Lactic Acid, ED     Status: None   Collection Time: 12/21/23  2:37 PM  Result Value Ref Range   Lactic Acid, Venous 1.0 0.5 - 1.9 mmol/L   DG Chest Port 1 View Result Date: 12/21/2023 EXAM: 1 VIEW(S) XRAY OF THE CHEST 12/21/2023 12:24:00 PM COMPARISON: 11/21/2023 CLINICAL HISTORY: Questionable sepsis - evaluate for abnormality FINDINGS: LUNGS AND PLEURA: Background of emphysema and chronic interstitial coarsening. Small right pleural effusion and right lung base atelectasis or air space disease. Small left pleural effusion has decreased from previous exam. No pulmonary edema. No pneumothorax. HEART AND MEDIASTINUM: Aortic calcification. BONES AND SOFT TISSUES: No acute osseous abnormality. IMPRESSION: 1. Small right pleural effusion with right lung base atelectasis or air space disease. Electronically signed by: Waddell Calk MD 12/21/2023 01:20 PM EST RP Workstation: [id number]    Pending Labs Unresulted Labs (From admission, onward)     Start     Ordered   12/22/23 0500  Protime-INR  Tomorrow morning,   R        12/21/23 1410   12/22/23 0500  Comprehensive metabolic panel  Tomorrow morning,   R        12/21/23 1410   12/22/23 0500  CBC  Tomorrow morning,   R        12/21/23  1410   12/21/23 1141  Urinalysis, w/ Reflex to Culture (Infection Suspected) -Urine,  Clean Catch  (Septic presentation on arrival (screening labs, nursing and treatment orders for obvious sepsis))  ONCE - URGENT,   URGENT       Question:  Specimen Source  Answer:  Urine, Clean Catch   12/21/23 1141   12/21/23 1139  Blood Culture (routine x 2)  (Septic presentation on arrival (screening labs, nursing and treatment orders for obvious sepsis))  BLOOD CULTURE X 2,   STAT      12/21/23 1141            Vitals/Pain Today's Vitals   12/21/23 1700 12/21/23 1714 12/21/23 1715 12/21/23 1730  BP: (!) 126/49  117/66 (!) 121/44  Pulse: 98  93 94  Resp: (!) 30  (!) 25 (!) 22  Temp:      TempSrc:      SpO2: 100%  100% 100%  Weight:      Height:      PainSc:  0-No pain      Isolation Precautions No active isolations  Medications Medications  lactated ringers  infusion ( Intravenous New Bag/Given 12/21/23 1315)  megestrol (MEGACE) 400 MG/10ML suspension 200 mg (has no administration in time range)  atorvastatin  (LIPITOR ) tablet 80 mg (has no administration in time range)  metoprolol  tartrate (LOPRESSOR ) tablet 25 mg (has no administration in time range)  mirtazapine  (REMERON ) tablet 7.5 mg (has no administration in time range)  pantoprazole  (PROTONIX ) EC tablet 40 mg (40 mg Oral Given 12/21/23 1600)  apixaban  (ELIQUIS ) tablet 2.5 mg (has no administration in time range)  budesonide -glycopyrrolate -formoterol  (BREZTRI ) 160-9-4.8 MCG/ACT inhaler 2 puff (has no administration in time range)  sodium chloride  flush (NS) 0.9 % injection 3 mL (3 mLs Intravenous Given 12/21/23 1435)  cefTRIAXone  (ROCEPHIN ) 2 g in sodium chloride  0.9 % 100 mL IVPB (has no administration in time range)  azithromycin  (ZITHROMAX ) 500 mg in sodium chloride  0.9 % 250 mL IVPB (has no administration in time range)  acetaminophen  (TYLENOL ) tablet 650 mg (has no administration in time range)    Or  acetaminophen  (TYLENOL ) suppository 650 mg (has no administration in time range)  polyethylene glycol (MIRALAX  /  GLYCOLAX ) packet 17 g (has no administration in time range)  albuterol  (PROVENTIL ) (2.5 MG/3ML) 0.083% nebulizer solution 2.5 mg (has no administration in time range)  cefTRIAXone  (ROCEPHIN ) 2 g in sodium chloride  0.9 % 100 mL IVPB (0 g Intravenous Stopped 12/21/23 1234)  azithromycin  (ZITHROMAX ) 500 mg in sodium chloride  0.9 % 250 mL IVPB (0 mg Intravenous Stopped 12/21/23 1313)  lactated ringers  bolus 1,000 mL (0 mLs Intravenous Stopped 12/21/23 1434)  acetaminophen  (TYLENOL ) tablet 1,000 mg (1,000 mg Oral Given 12/21/23 1210)  lactated ringers  bolus 500 mL (0 mLs Intravenous Stopped 12/21/23 1714)    Mobility walks with person assist     Focused Assessments    R Recommendations: See Admitting Provider Note  Report given to:   Additional Notes:

## 2023-12-21 NOTE — Sepsis Progress Note (Signed)
 Sepsis protocol is being followed by [facility].

## 2023-12-21 NOTE — ED Triage Notes (Signed)
 Pt to er room number 29 via ems, per ems pt is here for a fib with rvr with a rate around 200, pt presents with a fib with a rate of 146, pt awake and c/o sob, pt denies pain.

## 2023-12-21 NOTE — ED Notes (Signed)
 CCMD called, pt on monitor

## 2023-12-21 NOTE — ED Provider Notes (Signed)
 [place] EMERGENCY DEPARTMENT AT [facility] Provider Note   CSN: [id number] Arrival date & time: 12/21/23  1132     Patient presents with: Tachycardia   Courtney Cline is a 89 y.o. female.   Courtney Cline is a 89 y.o. female history of atrial fibrillation, hypertension, hyperlipidemia, COPD, GERD, who presents to the emergency department immediately EMS for evaluation of shortness of breath and A-fib RVR.  Patient reports that over the last 3 days she has been feeling increasingly short of breath and it worsens significantly this morning causing her to fall with EMS.  She reports before the shortness of breath started she noted a productive cough and some chills.  Wears 2 L of oxygen  at baseline but family has been having to increase her oxygen  due to increased work of breathing over the past 2 days.  When EMS arrived she was noted to be tachycardic and in A-fib with a rate up to 200s during transport.  Patient denies sensation of palpitations.  She reports some chest pain primarily with golfing but her primary concern is difficulty breathing.  No abdominal pain, nausea or vomiting.  She has not noticed significant swelling in her legs.  No other aggravating or alleviating factors.  Unsure of specific sick contacts.  Patient's son is at the bedside and helps provide additional history.  The history is provided by the patient, a relative, the EMS personnel and medical records.       Prior to Admission medications   Medication Sig Start Date End Date Taking? Authorizing Provider  acetaminophen  (TYLENOL ) 500 MG tablet Take 2 tablets (1,000 mg total) by mouth 3 (three) times daily. 11/22/23   Jerilynn Daphne SAILOR, NP  albuterol  (VENTOLIN  HFA) 108 (90 Base) MCG/ACT inhaler INHALE TWO PUFFS into THE lungs EVERY 6 HOURS AS NEEDED FOR wheezing OR SHORTNESS OF BREATH 10/24/23   Kassie Acquanetta Bradley, MD  apixaban  (ELIQUIS ) 2.5 MG TABS tablet Take 1 tablet (2.5 mg total) by mouth  2 (two) times daily. 11/22/23   Jerilynn Daphne SAILOR, NP  atorvastatin  (LIPITOR ) 80 MG tablet Take 1 tablet (80 mg total) by mouth daily. 11/22/23   Jerilynn Daphne SAILOR, NP  Cholecalciferol  (VITAMIN D ) 2000 UNITS CAPS Take 2,000 Units by mouth daily.     [provider]  Cyanocobalamin  (VITAMIN B 12) 500 MCG TABS Take 1 tablet by mouth daily.    [provider]  Ferrous Fumarate (HEMOCYTE - 106 MG FE) 324 (106 Fe) MG TABS tablet Take 1 tablet (106 mg of iron  total) by mouth daily. 11/23/23   Jerilynn Daphne SAILOR, NP  Fluticasone -Umeclidin-Vilant (TRELEGY ELLIPTA ) 200-62.5-25 MCG/ACT AEPB Inhale 1 puff into the lungs daily. 04/27/23   Kassie Acquanetta Bradley, MD  folic acid (FOLVITE) 1 MG tablet Take 1 tablet (1 mg total) by mouth daily. 11/23/23   Jerilynn Daphne SAILOR, NP  furosemide  (LASIX ) 20 MG tablet Take 1 tablet (20 mg total) by mouth daily. 11/22/23   Jerilynn Daphne SAILOR, NP  hydrALAZINE  (APRESOLINE ) 25 MG tablet Take 25 mg by mouth 2 (two) times daily. 10/23/23   [provider]  lidocaine  (LIDODERM ) 5 % Place 2 patches onto the skin daily. Remove & Discard patch within 12 hours or as directed by MD 11/22/23   Jerilynn Daphne SAILOR, NP  megestrol (MEGACE) 400 MG/10ML suspension Take 5 mLs (200 mg total) by mouth 2 (two) times daily. 11/22/23   Jerilynn Daphne SAILOR, NP  metoprolol  tartrate (LOPRESSOR ) 25 MG tablet Take  1 tablet (25 mg total) by mouth 2 (two) times daily. 11/22/23   Jerilynn Daphne SAILOR, NP  mirtazapine  (REMERON ) 7.5 MG tablet Take 1 tablet (7.5 mg total) by mouth at bedtime. 11/22/23   Jerilynn Daphne SAILOR, NP  Multiple Vitamin (MULTIVITAMIN WITH MINERALS) TABS tablet Take 1 tablet by mouth daily. 11/23/23   Jerilynn Daphne SAILOR, NP  OXYGEN  Inhale 4 L into the lungs continuous.    [provider]  oxymetazoline  (AFRIN) 0.05 % nasal spray Place 1 spray into both nostrils 2 (two) times daily as needed for congestion (nosebleeds). 11/22/23   Jerilynn Daphne SAILOR, NP  pantoprazole   (PROTONIX ) 40 MG tablet Take 1 tablet (40 mg total) by mouth 2 (two) times daily before a meal. 11/22/23   Jerilynn Daphne SAILOR, NP    Allergies: Patient has no known allergies.    Review of Systems  Constitutional:  Positive for chills.  Respiratory:  Positive for cough and shortness of breath.   Cardiovascular:  Negative for chest pain, palpitations and leg swelling.  All other systems reviewed and are negative.   Updated Vital Signs BP 83/61 ! (BP Location: Right Arm)   Pulse 146 !   Temp 101.4 F (38.6 C) ! (Rectal)   Resp 28 !   Ht 5' (1.524 m)   Wt 68 kg   SpO2 99% (4 L/min)   BMI 29.29 kg/m   Physical Exam Vitals and nursing note reviewed.  Constitutional:      General: She is in acute distress.     Appearance: Normal appearance. She is well-developed. She is ill-appearing. She is not diaphoretic.     Comments: On arrival patient is alert but is ill-appearing and in moderate respiratory distress  HENT:     Head: Normocephalic and atraumatic.  Eyes:     General:        Right eye: No discharge.        Left eye: No discharge.  Cardiovascular:     Rate and Rhythm: Tachycardia present. Rhythm irregular.     Pulses: Normal pulses.     Heart sounds: Normal heart sounds.     Comments: Patient is tachycardic with heart rate in the 140s-150s with an irregularly irregular rhythm Pulmonary:     Effort: Respiratory distress present.     Breath sounds: Rales present. No wheezing.     Comments: Patient is tachypneic with increased respiratory effort and moderate respiratory distress able to speak in short sentences.  No accessory muscle use noted.  Currently on 4 L nasal cannula (uses 2 L at baseline).  On auscultation patient with some crackles and reduced breath sounds in the right lower lobe. Frequent coughing during exam Abdominal:     General: Bowel sounds are normal. There is no distension.     Palpations: Abdomen is soft. There is no mass.     Tenderness: There is no  abdominal tenderness. There is no guarding.     Comments: Abdomen soft, nondistended, nontender to palpation in all quadrants without guarding or peritoneal signs  Musculoskeletal:        General: No deformity.     Cervical back: Neck supple.     Right lower leg: No edema.     Left lower leg: No edema.  Skin:    General: Skin is warm and dry.     Capillary Refill: Capillary refill takes less than 2 seconds.  Neurological:     Mental Status: She is alert and oriented to person, place,  and time.     Coordination: Coordination normal.     Comments: Speech is clear, able to follow commands Moves extremities without ataxia, coordination intact  Psychiatric:        Mood and Affect: Mood normal.        Behavior: Behavior normal.     (all labs ordered are listed, but only abnormal results are displayed) Labs Reviewed  COMPREHENSIVE METABOLIC PANEL WITH GFR - Abnormal; Notable for the following components:      Result Value   Glucose, Bld 125 (*)    Creatinine, Ser 1.68 (*)    Albumin 2.7 (*)    GFR, Estimated 29 (*)    All other components within normal limits  CBC WITH DIFFERENTIAL/PLATELET - Abnormal; Notable for the following components:   WBC 14.8 (*)    RBC 2.88 (*)    Hemoglobin 8.6 (*)    HCT 28.3 (*)    RDW 15.8 (*)    Neutro Abs 12.2 (*)    All other components within normal limits  PROTIME-INR - Abnormal; Notable for the following components:   Prothrombin Time 17.7 (*)    INR 1.4 (*)    All other components within normal limits  URINALYSIS, W/ REFLEX TO CULTURE (INFECTION SUSPECTED) - Abnormal; Notable for the following components:   Hgb urine dipstick SMALL (*)    Leukocytes,Ua TRACE (*)    All other components within normal limits  PROTIME-INR - Abnormal; Notable for the following components:   Prothrombin Time 19.4 (*)    INR 1.5 (*)    All other components within normal limits  COMPREHENSIVE METABOLIC PANEL WITH GFR - Abnormal; Notable for the following  components:   Glucose, Bld 107 (*)    Creatinine, Ser 1.46 (*)    Calcium  8.4 (*)    Total Protein 5.4 (*)    Albumin 2.1 (*)    GFR, Estimated 34 (*)    All other components within normal limits  CBC - Abnormal; Notable for the following components:   WBC 10.6 (*)    RBC 2.47 (*)    Hemoglobin 7.3 (*)    HCT 24.1 (*)    RDW 15.6 (*)    All other components within normal limits  BRAIN NATRIURETIC PEPTIDE - Abnormal; Notable for the following components:   B Natriuretic Peptide 489.6 (*)    All other components within normal limits  BASIC METABOLIC PANEL WITH GFR - Abnormal; Notable for the following components:   Glucose, Bld 160 (*)    Creatinine, Ser 1.64 (*)    Calcium  8.5 (*)    GFR, Estimated 30 (*)    All other components within normal limits  CBC - Abnormal; Notable for the following components:   RBC 2.56 (*)    Hemoglobin 7.4 (*)    HCT 24.8 (*)    MCHC 29.8 (*)    All other components within normal limits  BASIC METABOLIC PANEL WITH GFR - Abnormal; Notable for the following components:   Potassium 3.4 (*)    Glucose, Bld 100 (*)    Creatinine, Ser 1.62 (*)    Calcium  8.4 (*)    GFR, Estimated 30 (*)    All other components within normal limits  I-STAT CG4 LACTIC ACID, ED - Abnormal; Notable for the following components:   Lactic Acid, Venous 2.7 (*)    All other components within normal limits  RESP PANEL BY RT-PCR (RSV, FLU A&B, COVID)  RVPGX2  CULTURE, BLOOD (ROUTINE X  2)  CULTURE, BLOOD (ROUTINE X 2)  MAGNESIUM  PROCALCITONIN  MAGNESIUM  I-STAT CG4 LACTIC ACID, ED    EKG: EKG Interpretation Date/Time:  Thursday December 21 2023 13:11:17 EST Ventricular Rate:  120 PR Interval:  126 QRS Duration:  80 QT Interval:  319 QTC Calculation: 451 R Axis:   15  Text Interpretation: Sinus tachycardia Baseline wander in lead(s) V4 Confirmed by Freddi Hamilton [phone]) on 12/21/2023 2:28:25 PM  Radiology: [facility] Chest Port 1 View Result Date: 12/21/2023 EXAM: 1  VIEW(S) XRAY OF THE CHEST 12/21/2023 12:24:00 PM COMPARISON: 11/21/2023 CLINICAL HISTORY: Questionable sepsis - evaluate for abnormality FINDINGS: LUNGS AND PLEURA: Background of emphysema and chronic interstitial coarsening. Small right pleural effusion and right lung base atelectasis or air space disease. Small left pleural effusion has decreased from previous exam. No pulmonary edema. No pneumothorax. HEART AND MEDIASTINUM: Aortic calcification. BONES AND SOFT TISSUES: No acute osseous abnormality. IMPRESSION: 1. Small right pleural effusion with right lung base atelectasis or air space disease. Electronically signed by: Waddell Calk MD 12/21/2023 01:20 PM EST RP Workstation: [id number]      Procedures   Medications Ordered in the ED  cefTRIAXone  (ROCEPHIN ) 2 g in sodium chloride  0.9 % 100 mL IVPB (0 g Intravenous Stopped 12/21/23 1234)  azithromycin  (ZITHROMAX ) 500 mg in sodium chloride  0.9 % 250 mL IVPB (0 mg Intravenous Stopped 12/21/23 1313)  lactated ringers  bolus 1,000 mL (0 mLs Intravenous Stopped 12/21/23 1434)  acetaminophen  (TYLENOL ) tablet 1,000 mg (1,000 mg Oral Given 12/21/23 1210)  lactated ringers  bolus 500 mL (0 mLs Intravenous Stopped 12/21/23 1714)                                    Medical Decision Making Amount and/or Complexity of Data Reviewed Labs: ordered. Radiology: ordered.  Risk OTC drugs. Decision regarding hospitalization.   89 y.o. female presents to the ED with complaints of shortness of breath, this involves an extensive number of treatment options, and is a complaint that carries with it a high risk of complications and morbidity.  The differential diagnosis includes pneumonia, viral respiratory infection, COPD exacerbation, CHF, PE, ACS, A-fib RVR, sepsis  On arrival p ill-appearing, vitals significant for tachycardia with heart rate in the 1 40s on arrival with EKG showing A-fib RVR, but blood pressures measured in the left arm patient noted to be  hypotensive with systolic BP in the 80s, but improved with systolic 130s on the right, son arrived and reported that patient has an accurate blood pressures on the left so less concern for hypotension in the setting of other symptoms.  Patient tachypneic with increasing oxygen  requirement as well.  Additional history obtained from son at bedside. Previous records obtained and reviewed   Concern for A-fib RVR in the setting of infection and potential sepsis, code sepsis initiated and patient started on antibiotics for likely source of community-acquired pneumonia, started on IV fluids as well and given Tylenol  for fever.  Lab Tests:  I Ordered, reviewed, and interpreted labs, which included: Leukocytosis of 14.8 noted, hemoglobin of 8.6, anemia is chronic and unchanged, renal function is at baseline, no significant electrolyte derangements, initial lactic acid of 2.7, and with normal blood pressures this does not suggest severe sepsis or shock, repeat lactic acid a month ago, INR of 1.5, negative COVID and flu panel.  Urinalysis without signs of infection.  Blood cultures pending.  Imaging Studies ordered:  I ordered imaging studies which included chest x-ray, I independently visualized and interpreted imaging which showed right small pleural effusion with possible right lower lung atelectasis or airspace disease given fever and respiratory distress favor pneumonia  ED Course:   After IV fluids patient \ fortunately converted back to sinus rhythm, now in sinus tach with heart rate up to 120s, blood pressures have remained stable but patient with increasing oxygen  requirement in the setting of pneumonia.  Will admit for monitor evaluation and treatment.  Plan discussed with patient and family at bedside who are in agreement.  Case discussed with Dr. Alec Melvin with [facility] hospitalist who will see and admit the patient.  Portions of this note were generated with Scientist, clinical (histocompatibility and immunogenetics). Dictation  errors may occur despite best attempts at proofreading.      Final diagnoses:  Community acquired pneumonia of right lower lobe of lung  Pleural effusion  Atrial fibrillation with RVR [facility])    ED Discharge Orders     None          Alva Larraine FALCON, PA-C 12/26/23 1309    Freddi Hamilton, MD 12/26/23 1531

## 2023-12-21 NOTE — H&P (Signed)
 History and Physical   Courtney Cline FMW:[id number] DOB: 04-Feb-1934 DOA: 12/21/2023  PCP: Elliot Charm, MD   Patient coming from: Home  Chief Complaint: Shortness of breath, tachycardia  HPI: Courtney Cline is a 89 y.o. female with medical history significant of hypertension, hyperlipidemia, atrial fibrillation, CAD, CKD 3B, CHF, breast cancer, obesity, anemia, COPD, chronic respiratory failure on 3 L presenting with worsening shortness of breath.  Patient has had 2 days of worsening shortness of breath.  Also has associated cough.  Developed significant tachycardia today.  EMS was called.  On EMS arrival patient noted to have A-fib with RVR with rates in the 200s.  By time patient arrived to the ED heart rate improved to the 140s and while in the ED spontaneously converted to sinus rhythm in the 120s.  Patient denies fevers, chills, chest pain, abdominal pain, constipation, diarrhea, nausea, vomiting.  ED Course: Vital signs in ED notable for temperature to 101.4, heart rate in the 100s-150s, blood pressure in the 80s-150 systolic, requiring 4 L to maintain saturations.  Lab workup included CMP with creatinine stable 1.68, glucose 125, albumin 2.7.  CBC with leukocytosis of 14.8, hemoglobin stable at 8.6.  PT 17.7, INR 1.4.  Lactic acid initially elevated to 2.7 with repeat pending.  Respiratory panel for flu COVID and RSV negative.  Urinalysis pending.  Blood cultures pending.  Chest x-ray showed small right pleural effusion with right lower lobe atelectasis versus airspace disease. Patient received ceftriaxone , azithromycin , 1 L IV fluids, Tylenol  in the ED.  Also started on 150 cc an hour of fluids.  Review of Systems: As per HPI otherwise all other systems reviewed and are negative.  Past Medical History:  Diagnosis Date   Acute cystitis without hematuria    Acute metabolic encephalopathy 11/04/2023   AKI (acute kidney injury) 02/15/2021   Allergic  rhinitis    Arthritis    Breast calcification seen on mammogram    Right breast   Breast cancer (HCC)    COPD (chronic obstructive pulmonary disease) (HCC)    GERD (gastroesophageal reflux disease)    Hyperglycemia    Hyperlipidemia    Hypertension    Low back pain    On home oxygen  therapy    all the time   Osteopenia    Peripheral edema    Shortness of breath    Subclavian artery stenosis, left    Vitamin D  deficiency    Wears dentures    top   Wears glasses    reading    Past Surgical History:  Procedure Laterality Date   BREAST BIOPSY     BREAST LUMPECTOMY     right 2015   BREAST LUMPECTOMY WITH RADIOACTIVE SEED LOCALIZATION Right 12/20/2013   Procedure: RIGHT BREAST LUMPECTOMY WITH RADIOACTIVE SEED LOCALIZATION;  Surgeon: Debby Shipper, MD;  Location: [facility];  Service: General;  Laterality: Right;   CAROTID DUPLEX  2014   CATARACT EXTRACTION     both eyes    Social History  reports that she quit smoking about 29 years ago. Her smoking use included cigarettes. She started smoking about 69 years ago. She has a 40 pack-year smoking history. She has never used smokeless tobacco. She reports that she does not drink alcohol  and does not use drugs.  No Known Allergies  Family History  Problem Relation Age of Onset   Emphysema Mother    Emphysema Maternal Grandfather   Reviewed on admission  Prior to Admission medications  Medication Sig Start Date End Date Taking? Authorizing Provider  acetaminophen  (TYLENOL ) 500 MG tablet Take 2 tablets (1,000 mg total) by mouth 3 (three) times daily. 11/22/23   Jerilynn Daphne SAILOR, NP  albuterol  (VENTOLIN  HFA) 108 (90 Base) MCG/ACT inhaler INHALE TWO PUFFS into THE lungs EVERY 6 HOURS AS NEEDED FOR wheezing OR SHORTNESS OF BREATH 10/24/23   Kassie Acquanetta Bradley, MD  apixaban  (ELIQUIS ) 2.5 MG TABS tablet Take 1 tablet (2.5 mg total) by mouth 2 (two) times daily. 11/22/23   Jerilynn Daphne SAILOR, NP  atorvastatin  (LIPITOR )  80 MG tablet Take 1 tablet (80 mg total) by mouth daily. 11/22/23   Jerilynn Daphne SAILOR, NP  Cholecalciferol  (VITAMIN D ) 2000 UNITS CAPS Take 2,000 Units by mouth daily.     [provider]  Cyanocobalamin  (VITAMIN B 12) 500 MCG TABS Take 1 tablet by mouth daily.    [provider]  Ferrous Fumarate (HEMOCYTE - 106 MG FE) 324 (106 Fe) MG TABS tablet Take 1 tablet (106 mg of iron  total) by mouth daily. 11/23/23   Jerilynn Daphne SAILOR, NP  Fluticasone -Umeclidin-Vilant (TRELEGY ELLIPTA ) 200-62.5-25 MCG/ACT AEPB Inhale 1 puff into the lungs daily. 04/27/23   Kassie Acquanetta Bradley, MD  folic acid (FOLVITE) 1 MG tablet Take 1 tablet (1 mg total) by mouth daily. 11/23/23   Jerilynn Daphne SAILOR, NP  furosemide  (LASIX ) 20 MG tablet Take 1 tablet (20 mg total) by mouth daily. 11/22/23   Jerilynn Daphne SAILOR, NP  hydrALAZINE  (APRESOLINE ) 25 MG tablet Take 25 mg by mouth 2 (two) times daily. 10/23/23   [provider]  lidocaine  (LIDODERM ) 5 % Place 2 patches onto the skin daily. Remove & Discard patch within 12 hours or as directed by MD 11/22/23   Jerilynn Daphne SAILOR, NP  megestrol (MEGACE) 400 MG/10ML suspension Take 5 mLs (200 mg total) by mouth 2 (two) times daily. 11/22/23   Jerilynn Daphne SAILOR, NP  metoprolol  tartrate (LOPRESSOR ) 25 MG tablet Take 1 tablet (25 mg total) by mouth 2 (two) times daily. 11/22/23   Jerilynn Daphne SAILOR, NP  mirtazapine  (REMERON ) 7.5 MG tablet Take 1 tablet (7.5 mg total) by mouth at bedtime. 11/22/23   Jerilynn Daphne SAILOR, NP  Multiple Vitamin (MULTIVITAMIN WITH MINERALS) TABS tablet Take 1 tablet by mouth daily. 11/23/23   Jerilynn Daphne SAILOR, NP  OXYGEN  Inhale 4 L into the lungs continuous.    [provider]  oxymetazoline  (AFRIN) 0.05 % nasal spray Place 1 spray into both nostrils 2 (two) times daily as needed for congestion (nosebleeds). 11/22/23   Jerilynn Daphne SAILOR, NP  pantoprazole  (PROTONIX ) 40 MG tablet Take 1 tablet (40 mg total) by mouth 2 (two) times daily  before a meal. 11/22/23   Jerilynn Daphne SAILOR, NP    Physical Exam: Vitals:   12/21/23 1215 12/21/23 1230 12/21/23 1245 12/21/23 1313  BP:    (!) 112/56  Pulse: (!) 154 (!) 144 (!) 134 (!) 122  Resp: 19 (!) 27 20 (!) 24  Temp:    99.2 F (37.3 C)  TempSrc:    Oral  SpO2: 97% 100% 100% 100%  Weight:      Height:        Physical Exam Constitutional:      General: She is not in acute distress.    Appearance: Normal appearance.  HENT:     Head: Normocephalic and atraumatic.     Mouth/Throat:     Mouth: Mucous membranes are moist.  Pharynx: Oropharynx is clear.  Eyes:     Extraocular Movements: Extraocular movements intact.     Pupils: Pupils are equal, round, and reactive to light.  Cardiovascular:     Rate and Rhythm: Regular rhythm. Tachycardia present.     Pulses: Normal pulses.     Heart sounds: Normal heart sounds.  Pulmonary:     Effort: Pulmonary effort is normal. No respiratory distress.     Breath sounds: Decreased air movement present. Decreased breath sounds present. No wheezing.  Abdominal:     General: Bowel sounds are normal. There is no distension.     Palpations: Abdomen is soft.     Tenderness: There is no abdominal tenderness.  Musculoskeletal:        General: No swelling or deformity.  Skin:    General: Skin is warm and dry.  Neurological:     General: No focal deficit present.     Mental Status: Mental status is at baseline.    Labs on Admission: I have personally reviewed following labs and imaging studies  CBC: Recent Labs  Lab 12/21/23 1139  WBC 14.8*  NEUTROABS 12.2*  HGB 8.6*  HCT 28.3*  MCV 98.3  PLT 393    Basic Metabolic Panel: Recent Labs  Lab 12/21/23 1139  NA 142  K 4.6  CL 105  CO2 24  GLUCOSE 125*  BUN 18  CREATININE 1.68*  CALCIUM  8.9    GFR: Estimated Creatinine Clearance: 19.9 mL/min (A) (by C-G formula based on SCr of 1.68 mg/dL (H)).  Liver Function Tests: Recent Labs  Lab 12/21/23 1139  AST 26   ALT 17  ALKPHOS 50  BILITOT 0.8  PROT 7.0  ALBUMIN 2.7*    Urine analysis:    Component Value Date/Time   COLORURINE YELLOW 11/04/2023 0210   APPEARANCEUR HAZY (A) 11/04/2023 0210   LABSPEC 1.006 11/04/2023 0210   PHURINE 5.0 11/04/2023 0210   GLUCOSEU NEGATIVE 11/04/2023 0210   HGBUR SMALL (A) 11/04/2023 0210   BILIRUBINUR NEGATIVE 11/04/2023 0210   KETONESUR NEGATIVE 11/04/2023 0210   PROTEINUR NEGATIVE 11/04/2023 0210   NITRITE NEGATIVE 11/04/2023 0210   LEUKOCYTESUR LARGE (A) 11/04/2023 0210    Radiological Exams on Admission: DG Chest Port 1 View Result Date: 12/21/2023 EXAM: 1 VIEW(S) XRAY OF THE CHEST 12/21/2023 12:24:00 PM COMPARISON: 11/21/2023 CLINICAL HISTORY: Questionable sepsis - evaluate for abnormality FINDINGS: LUNGS AND PLEURA: Background of emphysema and chronic interstitial coarsening. Small right pleural effusion and right lung base atelectasis or air space disease. Small left pleural effusion has decreased from previous exam. No pulmonary edema. No pneumothorax. HEART AND MEDIASTINUM: Aortic calcification. BONES AND SOFT TISSUES: No acute osseous abnormality. IMPRESSION: 1. Small right pleural effusion with right lung base atelectasis or air space disease. Electronically signed by: Waddell Calk MD 12/21/2023 01:20 PM EST RP Workstation: [id number]   EKG: Independently reviewed.  Initial EKG showed atrial fibrillation with RVR at 144 bpm.  Nonspecific T wave changes.  Significant baseline artifact with mild wander.  Assessment/Plan Principal Problem:   Sepsis (HCC) Active Problems:   Breast cancer of upper-inner quadrant of right female breast (HCC)   Essential hypertension   Hyperlipidemia   Diastolic CHF (HCC)   Persistent atrial fibrillation (HCC)   CAP (community acquired pneumonia)   Acute on chronic respiratory failure with hypoxia (HCC)   CKD stage 3b, GFR 30-44 ml/min (HCC)   Obesity (BMI 30-39.9)   Normocytic anemia   COPD with acute  exacerbation (HCC)   History  of CVA (cerebrovascular accident)   Sepsis Pneumonia Acute on chronic respiratory failure with hypoxia > Patient presenting with 2 days of worsening shortness of breath with associated cough. > Found to have fever to 101.4 in the ED.  After converting to sinus rhythm remained tachycardic in the 120s.  Respiratory rate in the 20s.  Requiring 4 L which is increased her baseline to 2-3 L. > Leukocytosis to 14.8 and chest x-ray with evidence of possible airspace disease in the right lower lobe.  Lactic acid initially elevated to 2.7. > Findings concerning for sepsis secondary to pneumonia. > On exam: No wheezing, ?Rales/Rhonchi RLL but sounds are distant. Tachycardia > Received ceftriaxone , azithromycin , 1 L IV fluids in the ED. - Monitor in progressive unit - Continue with Supple oxygen , wean as tolerated - Continue with ceftriaxone  and azithromycin  - Continue with IV fluids, add additional 500 cc bolus (for total of 1.5L) - Trend lactic acid - Trend fever curve and WBC - Follow-up urinalysis and blood cultures - Supportive care  Atrial fibrillation with RVR > Patient initially presented with A-fib with RVR.  Rate reportedly in 200s for EMS, improved to the 140s in the ED.  Then spontaneously converted while being treated in the ED. - Continue home metoprolol  and Eliquis  - Treat underlying sepsis/pneumonia as above  Hypertension - Holding Lasix  in the setting of sepsis - Continue with metoprolol   Hyperlipidemia - Continue home atorvastatin   History of CVA - Continue home atorvastatin , Eliquis   CKD 3B > Creatinine stable at 1.68 in the ED. - Trend renal function and electrolytes  Chronic diastolic CHF > Last echo in September of this month when evaluated for stroke and showed EF of 60-60%, G2 DD, normal RV function. - Holding Lasix  -IV fluids in setting of sepsis as above - Continue home metoprolol   History of breast cancer Obesity -  Noted  Anemia > Hemoglobin stable at 8.6 - Trend CBC  COPD - Place home Trelegy with formulary Breztri  - Continue home albuterol   DVT prophylaxis: Eliquis  Code Status:   DNR/DNI Family Communication:  Updated at bedside Disposition Plan:   Patient is from:  Home  Anticipated DC to:  Pending clinical course  Anticipated DC date:  2 to 4 days  Anticipated DC barriers: None  Consults called:  None Admission status:  Inpatient, progressive  Severity of Illness: The appropriate patient status for this patient is INPATIENT. Inpatient status is judged to be reasonable and necessary in order to provide the required intensity of service to ensure the patient's safety. The patient's presenting symptoms, physical exam findings, and initial radiographic and laboratory data in the context of their chronic comorbidities is felt to place them at high risk for further clinical deterioration. Furthermore, it is not anticipated that the patient will be medically stable for discharge from the hospital within 2 midnights of admission.   * I certify that at the point of admission it is my clinical judgment that the patient will require inpatient hospital care spanning beyond 2 midnights from the point of admission due to high intensity of service, high risk for further deterioration and high frequency of surveillance required.DEWAINE Marsa KATHEE Seena MD [facility]  How to contact the [facility] Attending or Consulting provider 7A - 7P or covering provider during after hours 7P -7A, for this patient?   Check the care team in [facility] and look for a) attending/consulting [facility] provider listed and b) the [facility] team listed Log into [email or web address] and use [facility]'s universal password to access. If you do not have the password, please contact the hospital operator. Locate the [facility] provider you are looking for under [facility] and page to a number that you can be directly reached. If you still have difficulty  reaching the provider, please page the [facility] (Director on Call) for the Hospitalists listed on amion for assistance.  12/21/2023, 2:10 PM

## 2023-12-22 DIAGNOSIS — I4891 Unspecified atrial fibrillation: Secondary | ICD-10-CM

## 2023-12-22 DIAGNOSIS — J90 Pleural effusion, not elsewhere classified: Secondary | ICD-10-CM

## 2023-12-22 DIAGNOSIS — J189 Pneumonia, unspecified organism: Secondary | ICD-10-CM | POA: Diagnosis not present

## 2023-12-22 DIAGNOSIS — A419 Sepsis, unspecified organism: Secondary | ICD-10-CM | POA: Diagnosis not present

## 2023-12-22 LAB — URINALYSIS, W/ REFLEX TO CULTURE (INFECTION SUSPECTED)
Bacteria, UA: NONE SEEN
Bilirubin Urine: NEGATIVE
Glucose, UA: NEGATIVE mg/dL
Ketones, ur: NEGATIVE mg/dL
Nitrite: NEGATIVE
Protein, ur: NEGATIVE mg/dL
Specific Gravity, Urine: 1.011 (ref 1.005–1.030)
pH: 5 (ref 5.0–8.0)

## 2023-12-22 LAB — CBC
HCT: 24.1 % — ABNORMAL LOW (ref 36.0–46.0)
Hemoglobin: 7.3 g/dL — ABNORMAL LOW (ref 12.0–15.0)
MCH: 29.6 pg (ref 26.0–34.0)
MCHC: 30.3 g/dL (ref 30.0–36.0)
MCV: 97.6 fL (ref 80.0–100.0)
Platelets: 306 K/uL (ref 150–400)
RBC: 2.47 MIL/uL — ABNORMAL LOW (ref 3.87–5.11)
RDW: 15.6 % — ABNORMAL HIGH (ref 11.5–15.5)
WBC: 10.6 K/uL — ABNORMAL HIGH (ref 4.0–10.5)
nRBC: 0 % (ref 0.0–0.2)

## 2023-12-22 LAB — COMPREHENSIVE METABOLIC PANEL WITH GFR
ALT: 12 U/L (ref 0–44)
AST: 16 U/L (ref 15–41)
Albumin: 2.1 g/dL — ABNORMAL LOW (ref 3.5–5.0)
Alkaline Phosphatase: 39 U/L (ref 38–126)
Anion gap: 11 (ref 5–15)
BUN: 17 mg/dL (ref 8–23)
CO2: 25 mmol/L (ref 22–32)
Calcium: 8.4 mg/dL — ABNORMAL LOW (ref 8.9–10.3)
Chloride: 105 mmol/L (ref 98–111)
Creatinine, Ser: 1.46 mg/dL — ABNORMAL HIGH (ref 0.44–1.00)
GFR, Estimated: 34 mL/min — ABNORMAL LOW (ref >60)
Glucose, Bld: 107 mg/dL — ABNORMAL HIGH (ref 70–99)
Potassium: 4.3 mmol/L (ref 3.5–5.1)
Sodium: 141 mmol/L (ref 135–145)
Total Bilirubin: 0.5 mg/dL (ref 0.0–1.2)
Total Protein: 5.4 g/dL — ABNORMAL LOW (ref 6.5–8.1)

## 2023-12-22 LAB — BRAIN NATRIURETIC PEPTIDE: B Natriuretic Peptide: 489.6 pg/mL — ABNORMAL HIGH (ref 0.0–100.0)

## 2023-12-22 LAB — PROTIME-INR
INR: 1.5 — ABNORMAL HIGH (ref 0.8–1.2)
Prothrombin Time: 19.4 s — ABNORMAL HIGH (ref 11.4–15.2)

## 2023-12-22 LAB — PROCALCITONIN: Procalcitonin: 0.94 ng/mL

## 2023-12-22 MED ORDER — GUAIFENESIN ER 600 MG PO TB12
600.0000 mg | ORAL_TABLET | Freq: Two times a day (BID) | ORAL | Status: DC
  Administered 2023-12-22 – 2024-01-01 (×20): 600 mg via ORAL
  Filled 2023-12-22 (×21): qty 1

## 2023-12-22 MED ORDER — FUROSEMIDE 10 MG/ML IJ SOLN
20.0000 mg | Freq: Two times a day (BID) | INTRAMUSCULAR | Status: DC
  Administered 2023-12-22 – 2023-12-23 (×2): 20 mg via INTRAVENOUS
  Filled 2023-12-22 (×2): qty 4

## 2023-12-22 MED ORDER — DIGOXIN 0.25 MG/ML IJ SOLN
0.2500 mg | Freq: Once | INTRAMUSCULAR | Status: DC
Start: 2023-12-22 — End: 2023-12-25
  Filled 2023-12-22: qty 1

## 2023-12-22 MED ORDER — FUROSEMIDE 10 MG/ML IJ SOLN
20.0000 mg | Freq: Once | INTRAMUSCULAR | Status: AC
  Administered 2023-12-22: 20 mg via INTRAVENOUS
  Filled 2023-12-22: qty 4

## 2023-12-22 MED ORDER — MEGESTROL ACETATE 400 MG/10ML PO SUSP
200.0000 mg | Freq: Two times a day (BID) | ORAL | Status: DC
  Administered 2023-12-22 – 2024-01-01 (×19): 200 mg via ORAL
  Filled 2023-12-22 (×2): qty 10
  Filled 2023-12-22: qty 5
  Filled 2023-12-22 (×12): qty 10
  Filled 2023-12-22: qty 5
  Filled 2023-12-22 (×2): qty 10
  Filled 2023-12-22: qty 5
  Filled 2023-12-22 (×3): qty 10

## 2023-12-22 NOTE — Progress Notes (Signed)
 [facility]  PROGRESS NOTE  Ulyana Pitones FMW:[id number] DOB: [birth year] DOA: 12/21/2023 PCP: Elliot Charm, MD   Brief HPI:   89 y.o. female with medical history significant of hypertension, hyperlipidemia, atrial fibrillation, CAD, CKD 3B, CHF, breast cancer, obesity, anemia, COPD, chronic respiratory failure on 3 L presenting with worsening shortness of breath.   Patient has had 2 days of worsening shortness of breath.  Also has associated cough.  Developed significant tachycardia today.  EMS was called.   On EMS arrival patient noted to have A-fib with RVR with rates in the 200s.  By time patient arrived to the ED heart rate improved to the 140s and while in the ED spontaneously converted to sinus rhythm in the 120s. Chest x-ray showed small right pleural effusion with right lower lobe atelectasis versus airspace disease. Patient received ceftriaxone , azithromycin , 1 L IV fluids, Tylenol  in the ED.  Also started on 150 cc an hour of fluids   Assessment/Plan:    Acute on chronic hypoxemic respiratory failure -Multifactorial, likely due to pneumonia as well as acute diastolic CHF - Baseline oxygen  requirement 2 to 3 L/min - Patient had leukocytosis with chest x-ray showed evidence of possible airspace disease in the right lower lobe, lactic acid 2.7 - Started on Rocephin  and Zithromax  - Also diuresed very well with IV Lasix  20 mg given this morning - Will check BNP - Continue with antibiotics, check procalcitonin  Sepsis due to pneumonia - Lactic acid was 2.7, chest x-ray showed worsening infiltrate - Continue antibiotics as above    Atrial fibrillation with RVR - presented with A-fib with RVR.  Rate reportedly in 200s for EMS, improved to the 140s in the ED.   - spontaneously converted while being treated in the ED. - Continue home metoprolol  and Eliquis  - Treat underlying sepsis/pneumonia as above   Hypertension - Continue with metoprolol     Hyperlipidemia - Continue home atorvastatin    History of CVA - Continue home atorvastatin , Eliquis    CKD 3B - Creatinine stable at 1.68 in the ED. - Trend renal function and electrolytes   Acute on chronic diastolic CHF - Echocardiogram obtained in September this year showed EF of 60-65 percent with grade 2 diastolic dysfunction -Diuresed well with IV Lasix  given this morning -Check BNP -Continue with Lasix  20 mg IV every 12 hours -Strict intake and output   History of breast cancer Obesity - Noted   Anemia - Hemoglobin stable at 7.3 - Trend CBC   COPD - Place home Trelegy with formulary Breztri  - Continue home albuterol       DVT prophylaxis: Apixaban   Medications     apixaban   2.5 mg Oral BID   atorvastatin   80 mg Oral Daily   budesonide -glycopyrrolate -formoterol   2 puff Inhalation BID   digoxin  0.25 mg Intravenous Once   guaiFENesin   600 mg Oral BID   megestrol  200 mg Oral BID   metoprolol  tartrate  25 mg Oral BID   mirtazapine   7.5 mg Oral QHS   pantoprazole   40 mg Oral BID AC   sodium chloride  flush  3 mL Intravenous Q12H     Data Reviewed:   CBG:  No results for input(s): GLUCAP in the last 168 hours.  SpO2: 90 % O2 Flow Rate (L/min): 4 L/min FiO2 (%): 36 %    Vitals:   12/21/23 2359 12/22/23 0256 12/22/23 0352 12/22/23 0732  BP: (!) 133/53 (!) 156/65 (!) 140/63 (!) 157/54  Pulse: 77 (!) 101 84 100  Resp: 19  16 20   Temp: 98 F (36.7 C)  98.7 F (37.1 C) 98.4 F (36.9 C)  TempSrc: Oral  Oral Oral  SpO2: 100% 97% 99% 90%  Weight:      Height:          Data Reviewed:  Basic Metabolic Panel: Recent Labs  Lab 12/21/23 1139 12/22/23 0131  NA 142 141  K 4.6 4.3  CL 105 105  CO2 24 25  GLUCOSE 125* 107*  BUN 18 17  CREATININE 1.68* 1.46*  CALCIUM  8.9 8.4*  MG 2.1  --     CBC: Recent Labs  Lab 12/21/23 1139 12/22/23 0517  WBC 14.8* 10.6*  NEUTROABS 12.2*  --   HGB 8.6* 7.3*  HCT 28.3* 24.1*  MCV 98.3 97.6   PLT 393 306    LFT Recent Labs  Lab 12/21/23 1139 12/22/23 0131  AST 26 16  ALT 17 12  ALKPHOS 50 39  BILITOT 0.8 0.5  PROT 7.0 5.4*  ALBUMIN 2.7* 2.1*     Antibiotics: Anti-infectives (From admission, onward)    Start     Dose/Rate Route Frequency Ordered Stop   12/22/23 1000  cefTRIAXone  (ROCEPHIN ) 2 g in sodium chloride  0.9 % 100 mL IVPB        2 g 200 mL/hr over 30 Minutes Intravenous Every 24 hours 12/21/23 1410 12/27/23 0959   12/22/23 1000  azithromycin  (ZITHROMAX ) 500 mg in sodium chloride  0.9 % 250 mL IVPB        500 mg 250 mL/hr over 60 Minutes Intravenous Every 24 hours 12/21/23 1410 12/27/23 0959   12/21/23 1145  cefTRIAXone  (ROCEPHIN ) 2 g in sodium chloride  0.9 % 100 mL IVPB        2 g 200 mL/hr over 30 Minutes Intravenous Once 12/21/23 1141 12/21/23 1234   12/21/23 1145  azithromycin  (ZITHROMAX ) 500 mg in sodium chloride  0.9 % 250 mL IVPB        500 mg 250 mL/hr over 60 Minutes Intravenous  Once 12/21/23 1141 12/21/23 1313        CONSULTS none  Code Status: DNR  Family Communication: No family at bedside     Subjective   Breathing is significantly improved.  Diuresed very well with IV Lasix  given this morning   Objective    Physical Examination:   Appears in no acute distress S1-S2, regular Lungs clear to auscultation bilaterally Abdomen is soft, nontender Extremities no edema   Status is: Inpatient:             [name]   [facility] If 7PM-7AM, please contact night-coverage at [email or web address], Office  [phone]   12/22/2023, 9:52 AM  LOS: 1 day

## 2023-12-22 NOTE — TOC Initial Note (Signed)
 Transition of Care [facility]) - Initial/Assessment Note    Patient Details  Name: Courtney Cline MRN: [id number] Date of Birth: 04-29-1933  [facility] CM/SW Contact:    Landry DELENA Senters, RN Phone Number: 12/22/2023, 11:24 AM  Clinical Narrative:                  Chief Complaint: Shortness of breath, tachycardia   Patient from home with husband, does have 2 sons that provide support. Patient has DME at home, 4L O2 home baseline via [employer]. Sons provide transportation and patient manages her own medications.  CM will continue to follow.   Expected Discharge Plan:  (TBD) Barriers to Discharge: Continued Medical Work up   Patient Goals and CMS Choice            Expected Discharge Plan and Services       Living arrangements for the past 2 months: Single Family Home                                      Prior Living Arrangements/Services Living arrangements for the past 2 months: Single Family Home Lives with:: Self, Spouse Patient language and need for interpreter reviewed:: Yes Do you feel safe going back to the place where you live?: Yes      Need for Family Participation in Patient Care: Yes (Comment) Care giver support system in place?: Yes (comment) Current home services: DME (rollator, shower seat, commode, wheelchair, cpap) Criminal Activity/Legal Involvement Pertinent to Current Situation/Hospitalization: No - Comment as needed  Activities of Daily Living   ADL Screening (condition at time of admission) Independently performs ADLs?: Yes (appropriate for developmental age) Is the patient deaf or have difficulty hearing?: No Does the patient have difficulty seeing, even when wearing glasses/contacts?: No Does the patient have difficulty concentrating, remembering, or making decisions?: No  Permission Sought/Granted                  Emotional Assessment Appearance:: Developmentally appropriate Attitude/Demeanor/Rapport:  Engaged Affect (typically observed): Calm Orientation: : Oriented to Self, Oriented to Place, Oriented to  Time, Oriented to Situation Alcohol  / Substance Use: Not Applicable Psych Involvement: No (comment)  Admission diagnosis:  Pleural effusion [J90] Atrial fibrillation with RVR (HCC) [I48.91] Sepsis (HCC) [A41.9] Community acquired pneumonia of right lower lobe of lung [J18.9] Patient Active Problem List   Diagnosis Date Noted   History of CVA (cerebrovascular accident) 11/08/2023   COPD with acute exacerbation (HCC) 02/15/2023   CKD stage 3b, GFR 30-44 ml/min (HCC) 02/14/2023   Obesity (BMI 30-39.9) 02/14/2023   Normocytic anemia 02/14/2023   Myocardial injury 02/14/2023   Acute on chronic respiratory failure with hypoxia (HCC) 01/02/2022   Elevated troponin 01/02/2022   Sepsis (HCC) 01/02/2022   Aortic stenosis 05/06/2021   Malnutrition of moderate degree 02/16/2021   CAP (community acquired pneumonia) 01/21/2020   Persistent atrial fibrillation (HCC) 08/20/2019   COPD without exacerbation (HCC) 10/15/2018   Healthcare maintenance 10/15/2018   Physical deconditioning 10/15/2018   Sinusitis 01/29/2018   Diastolic CHF (HCC) 07/17/2017   Vitamin D  deficiency    Allergic rhinitis 11/23/2015   Osteopenia with high risk of fracture 11/05/2015   Post-nasal drip 03/16/2015   Anemia of chronic disease 03/10/2015   Subclavian artery stenosis, left 04/24/2014   Bilateral lower extremity edema 04/24/2014   Essential hypertension 04/24/2014   Hyperlipidemia 04/24/2014   Breast  cancer of upper-inner quadrant of right female breast (HCC) 11/19/2013   Chronic hypoxemic respiratory failure (HCC) 10/02/2013   PCP:  Elliot Charm, MD Pharmacy:   [facility] - [place], [name] - [address] [name] [address] Phone: [phone] Fax: [phone]  [facility] [address] [name] [address] Phone:  [phone] Fax: [phone]  [id number] [place], [name] - [address] AT [facility] [address] [name] [address] Phone: [phone] Fax: [phone]     Social Drivers of Health (SDOH) Social History: SDOH Screenings   Food Insecurity: No Food Insecurity (12/21/2023)  Housing: Low Risk  (12/21/2023)  Transportation Needs: No Transportation Needs (12/21/2023)  Utilities: Not At Risk (12/21/2023)  Social Connections: Socially Isolated (12/21/2023)  Tobacco Use: Medium Risk (12/21/2023)   SDOH Interventions:     Readmission Risk Interventions    02/15/2023    1:37 PM  Readmission Risk Prevention Plan  Transportation Screening Complete  PCP or Specialist Appt within 5-7 Days Complete  Home Care Screening Complete  Medication Review (RN CM) Complete

## 2023-12-22 NOTE — Plan of Care (Signed)
  Problem: Fluid Volume: Goal: Hemodynamic stability will improve Outcome: Progressing   Problem: Respiratory: Goal: Ability to maintain adequate ventilation will improve Outcome: Progressing   Problem: Clinical Measurements: Goal: Ability to maintain clinical measurements within normal limits will improve Outcome: Progressing Goal: Will remain free from infection Outcome: Progressing Goal: Diagnostic test results will improve Outcome: Progressing Goal: Respiratory complications will improve Outcome: Progressing Goal: Cardiovascular complication will be avoided Outcome: Progressing

## 2023-12-23 DIAGNOSIS — A419 Sepsis, unspecified organism: Secondary | ICD-10-CM | POA: Diagnosis not present

## 2023-12-23 DIAGNOSIS — I4891 Unspecified atrial fibrillation: Secondary | ICD-10-CM | POA: Diagnosis not present

## 2023-12-23 DIAGNOSIS — J189 Pneumonia, unspecified organism: Secondary | ICD-10-CM | POA: Diagnosis not present

## 2023-12-23 DIAGNOSIS — J90 Pleural effusion, not elsewhere classified: Secondary | ICD-10-CM | POA: Diagnosis not present

## 2023-12-23 MED ORDER — AMIODARONE HCL IN DEXTROSE 360-4.14 MG/200ML-% IV SOLN
60.0000 mg/h | INTRAVENOUS | Status: AC
  Administered 2023-12-23 – 2023-12-24 (×2): 60 mg/h via INTRAVENOUS
  Filled 2023-12-23: qty 200

## 2023-12-23 MED ORDER — DIGOXIN 0.25 MG/ML IJ SOLN
0.2500 mg | Freq: Once | INTRAMUSCULAR | Status: AC
  Administered 2023-12-23: 0.25 mg via INTRAVENOUS
  Filled 2023-12-23: qty 1

## 2023-12-23 MED ORDER — DILTIAZEM HCL-DEXTROSE 125-5 MG/125ML-% IV SOLN (PREMIX)
5.0000 mg/h | INTRAVENOUS | Status: DC
  Administered 2023-12-23: 5 mg/h via INTRAVENOUS
  Filled 2023-12-23 (×2): qty 125

## 2023-12-23 MED ORDER — AMIODARONE LOAD VIA INFUSION
150.0000 mg | Freq: Once | INTRAVENOUS | Status: AC
  Administered 2023-12-23: 150 mg via INTRAVENOUS
  Filled 2023-12-23: qty 83.34

## 2023-12-23 MED ORDER — FUROSEMIDE 40 MG PO TABS
40.0000 mg | ORAL_TABLET | Freq: Every day | ORAL | Status: DC
  Administered 2023-12-24 – 2023-12-27 (×4): 40 mg via ORAL
  Filled 2023-12-23 (×4): qty 1

## 2023-12-23 MED ORDER — AMIODARONE HCL IN DEXTROSE 360-4.14 MG/200ML-% IV SOLN
30.0000 mg/h | INTRAVENOUS | Status: DC
  Administered 2023-12-24 – 2023-12-26 (×5): 30 mg/h via INTRAVENOUS
  Filled 2023-12-23 (×6): qty 200

## 2023-12-23 MED ORDER — SODIUM CHLORIDE 0.9 % IV BOLUS
250.0000 mL | Freq: Once | INTRAVENOUS | Status: AC
  Administered 2023-12-23: 250 mL via INTRAVENOUS

## 2023-12-23 NOTE — Progress Notes (Addendum)
 [facility]  PROGRESS NOTE  Courtney Cline FMW:[id number] DOB: 1934-01-03 DOA: 12/21/2023 PCP: Elliot Charm, MD   Brief HPI:   89 y.o. female with medical history significant of hypertension, hyperlipidemia, atrial fibrillation, CAD, CKD 3B, CHF, breast cancer, obesity, anemia, COPD, chronic respiratory failure on 3 L presenting with worsening shortness of breath.   Patient has had 2 days of worsening shortness of breath.  Also has associated cough.  Developed significant tachycardia today.  EMS was called.   On EMS arrival patient noted to have A-fib with RVR with rates in the 200s.  By time patient arrived to the ED heart rate improved to the 140s and while in the ED spontaneously converted to sinus rhythm in the 120s. Chest x-ray showed small right pleural effusion with right lower lobe atelectasis versus airspace disease. Patient received ceftriaxone , azithromycin , 1 L IV fluids, Tylenol  in the ED.  Also started on 150 cc an hour of fluids   Assessment/Plan:    Acute on chronic hypoxemic respiratory failure -Multifactorial, due to pneumonia as well as acute diastolic CHF - Baseline oxygen  requirement 4 L/min - Patient had leukocytosis with chest x-ray showed evidence of possible airspace disease in the right lower lobe, lactic acid 2.7 - Started on Rocephin  and Zithromax  - Also diuresed very well with IV Lasix  20 mg every 12 hours - BNP elevated to 489, procalcitonin 0.94 - IV Lasix  will be changed to Lasix  40 mg p.o. daily as patient looks dry today  Sepsis due to pneumonia - Lactic acid was 2.7, chest x-ray showed worsening infiltrate - Continue antibiotics as above    Atrial fibrillation with RVR - presented with A-fib with RVR.  Rate reportedly in 200s for EMS, improved to the 140s in the ED.   - spontaneously converted while being treated in the ED. - Continue home metoprolol  and Eliquis  - Treat underlying sepsis/pneumonia as above    Hypertension - Continue with metoprolol    Hyperlipidemia - Continue home atorvastatin    History of CVA - Continue home atorvastatin , Eliquis    CKD 3B - Creatinine stable at 1.68 in the ED. - Trend renal function and electrolytes   Acute on chronic diastolic CHF - Echocardiogram obtained in September this year showed EF of 60-65 percent with grade 2 diastolic dysfunction -Diuresed well with IV Lasix   - BNP elevated 489 - Patient now looks dry, will change Lasix  to 40 mg p.o. daily from tomorrow morning   History of breast cancer Obesity - Noted   Anemia - Hemoglobin stable at 7.3 - Trend CBC   COPD - Place home Trelegy with formulary Breztri  - Continue home albuterol       DVT prophylaxis: Apixaban   Medications     apixaban   2.5 mg Oral BID   atorvastatin   80 mg Oral Daily   budesonide -glycopyrrolate -formoterol   2 puff Inhalation BID   digoxin  0.25 mg Intravenous Once   furosemide   20 mg Intravenous Q12H   guaiFENesin   600 mg Oral BID   megestrol  200 mg Oral BID   metoprolol  tartrate  25 mg Oral BID   mirtazapine   7.5 mg Oral QHS   pantoprazole   40 mg Oral BID AC   sodium chloride  flush  3 mL Intravenous Q12H     Data Reviewed:   CBG:  No results for input(s): GLUCAP in the last 168 hours.  SpO2: 98 % O2 Flow Rate (L/min): 4 L/min FiO2 (%): 36 %    Vitals:   12/22/23 2127 12/23/23  0031 12/23/23 0356 12/23/23 0728  BP: (!) 147/53 (!) 131/53 (!) 159/65 (!) 152/54  Pulse: 85 82 91 92  Resp:   18 18  Temp:  98.9 F (37.2 C) 98.4 F (36.9 C) 98.6 F (37 C)  TempSrc:  Oral Oral   SpO2:  100% 96% 98%  Weight:      Height:          Data Reviewed:  Basic Metabolic Panel: Recent Labs  Lab 12/21/23 1139 12/22/23 0131  NA 142 141  K 4.6 4.3  CL 105 105  CO2 24 25  GLUCOSE 125* 107*  BUN 18 17  CREATININE 1.68* 1.46*  CALCIUM  8.9 8.4*  MG 2.1  --     CBC: Recent Labs  Lab 12/21/23 1139 12/22/23 0517  WBC 14.8* 10.6*   NEUTROABS 12.2*  --   HGB 8.6* 7.3*  HCT 28.3* 24.1*  MCV 98.3 97.6  PLT 393 306    LFT Recent Labs  Lab 12/21/23 1139 12/22/23 0131  AST 26 16  ALT 17 12  ALKPHOS 50 39  BILITOT 0.8 0.5  PROT 7.0 5.4*  ALBUMIN 2.7* 2.1*     Antibiotics: Anti-infectives (From admission, onward)    Start     Dose/Rate Route Frequency Ordered Stop   12/22/23 1000  cefTRIAXone  (ROCEPHIN ) 2 g in sodium chloride  0.9 % 100 mL IVPB        2 g 200 mL/hr over 30 Minutes Intravenous Every 24 hours 12/21/23 1410 12/27/23 0959   12/22/23 1000  azithromycin  (ZITHROMAX ) 500 mg in sodium chloride  0.9 % 250 mL IVPB        500 mg 250 mL/hr over 60 Minutes Intravenous Every 24 hours 12/21/23 1410 12/27/23 0959   12/21/23 1145  cefTRIAXone  (ROCEPHIN ) 2 g in sodium chloride  0.9 % 100 mL IVPB        2 g 200 mL/hr over 30 Minutes Intravenous Once 12/21/23 1141 12/21/23 1234   12/21/23 1145  azithromycin  (ZITHROMAX ) 500 mg in sodium chloride  0.9 % 250 mL IVPB        500 mg 250 mL/hr over 60 Minutes Intravenous  Once 12/21/23 1141 12/21/23 1313        CONSULTS none  Code Status: DNR  Family Communication: No family at bedside     Subjective   Feels much better today.  Diuresing very well with IV Lasix .   Objective    Physical Examination:  General-appears in no acute distress Heart-S1-S2, regular, no murmur auscultated Lungs-clear to auscultation bilaterally, no wheezing or crackles auscultated Abdomen-soft, nontender, no organomegaly Extremities-no edema in the lower extremities Neuro-alert, oriented x3, no focal deficit noted   Status is: Inpatient:             Courtney Cline   [facility] If 7PM-7AM, please contact night-coverage at [email or web address], Office  [phone]   12/23/2023, 10:10 AM  LOS: 2 days

## 2023-12-23 NOTE — Evaluation (Signed)
 Physical Therapy Evaluation Patient Details Name: Courtney Cline MRN: [id number] DOB: [birth year] Today's Date: 12/23/2023  History of Present Illness  The pt is an 89 yo female presenting 11/6 with afib with RVR. Work up concerning for sepsis, admitted for resp failure likely due to PNA. PMH includes: HTN, HLD, afib, CAD, CKD III, CHF, breast cancer, obesity, COPD on 4L O2.  Clinical Impression  Pt in bed upon arrival of PT, agreeable to evaluation at this time. Prior to admission the pt reports she was independent with use of RW in the home, independent with ADLs, but has support from her sons as needed for IADLs and transportation. The pt required min-modA for all OOB transfers at this time, and was limited to 3-5 ft ambulation due to fatigue and onset of dizziness and clamminess. BP stable when taken, HR 100-120bpm generally with mobility. After pt returned to bed, HR returned to afib rhythm with rates 140-170bpm. RN aware, advised increase in O2 from 3L to 4L. Pt hopeful to return home with son's assist, will continue to benefit from skilled PT acutely to progress functional strength, power, endurance, and stability prior to return home with family support and could benefit from post-acute rehab <3hours/day to facilitate these improvements. Will continue to follow acutely.         If plan is discharge home, recommend the following: A lot of help with walking and/or transfers;A lot of help with bathing/dressing/bathroom;Assist for transportation;Help with stairs or ramp for entrance;Assistance with cooking/housework   Can travel by private vehicle   No    Equipment Recommendations None recommended by PT  Recommendations for Other Services       Functional Status Assessment Patient has had a recent decline in their functional status and demonstrates the ability to make significant improvements in function in a reasonable and predictable amount of time.     Precautions / Restrictions  Precautions Precautions: Fall Recall of Precautions/Restrictions: Impaired Precaution/Restrictions Comments: watch HR and BP, 4L at home Restrictions Weight Bearing Restrictions Per Provider Order: No      Mobility  Bed Mobility Overal bed mobility: Needs Assistance Bed Mobility: Supine to Sit, Sit to Supine     Supine to sit: Contact guard, HOB elevated, Used rails Sit to supine: Contact guard assist, HOB elevated, Used rails   General bed mobility comments: no physical assist, increased time and use of bed rails    Transfers Overall transfer level: Needs assistance Equipment used: Rolling walker (2 wheels) Transfers: Sit to/from Stand, Bed to chair/wheelchair/BSC Sit to Stand: Min assist   Step pivot transfers: Mod assist       General transfer comment: minA to rise and steady, minA to manage balance and RW with pivotal steps, cues to fully pivot to [facility] before sitting    Ambulation/Gait Ambulation/Gait assistance: Mod assist Gait Distance (Feet): 5 Feet Assistive device: Rolling walker (2 wheels) Gait Pattern/deviations: Step-to pattern, Decreased stride length, Trunk flexed       General Gait Details: small steps from BSC back to head of bed, limited by fatigue and dizziness. BP taken in RUE stable. HR 120s SpO2 95% on 3L      Balance Overall balance assessment: Needs assistance Sitting-balance support: Bilateral upper extremity supported, Feet supported Sitting balance-Leahy Scale: Fair Sitting balance - Comments: close supervision EOB   Standing balance support: Bilateral upper extremity supported, During functional activity Standing balance-Leahy Scale: Poor Standing balance comment: reliant on BUE support and minA external support  Pertinent Vitals/Pain Pain Assessment Pain Assessment: No/denies pain Pain Intervention(s): Monitored during session    Home Living Family/patient expects to be discharged to::  Private residence Living Arrangements: Spouse/significant other Available Help at Discharge: Family;Available 24 hours/day (husband unable to assist, 2 sons local and able to help) Type of Home: House Home Access: Stairs to enter Entrance Stairs-Rails: Doctor, General Practice of Steps: 3-4   Home Layout: One level Home Equipment: [employer] (2 wheels);Shower seat;Wheelchair - manual;BSC/3in1;Grab bars - tub/shower Additional Comments: Pt lives with her husband who is available all day, but cannot assist physically    Prior Function Prior Level of Function : Needs assist             Mobility Comments: independent with RW, no recent falls, no exercise but active in the home ADLs Comments: independent with ADLs, sons help as needed with IADLs and driving     Extremity/Trunk Assessment   Upper Extremity Assessment Upper Extremity Assessment: Generalized weakness;Right hand dominant    Lower Extremity Assessment Lower Extremity Assessment: Generalized weakness;RLE deficits/detail;LLE deficits/detail RLE Deficits / Details: Hip flexion 3+/5, Knee ext 4+/5, Ankle DF 3/5 RLE Sensation: WNL RLE Coordination: WNL LLE Deficits / Details: Hip flexion 4/5, Knee ext 4+/5, Ankle DF 3+/5 LLE Sensation: WNL LLE Coordination: WNL    Cervical / Trunk Assessment Cervical / Trunk Assessment: Kyphotic  Communication   Communication Communication: No apparent difficulties    Cognition Arousal: Alert Behavior During Therapy: WFL for tasks assessed/performed   PT - Cognitive impairments: No apparent impairments                       PT - Cognition Comments: not formally assessed, but [facility] for session Following commands: Intact       Cueing Cueing Techniques: Verbal cues     General Comments General comments (skin integrity, edema, etc.): SpO2 >93% on 3L during session, HR 100-125bpm during session, after pt returned to bed rhythm to afib with HR 140-170bpm     Exercises     Assessment/Plan    PT Assessment Patient needs continued PT services  PT Problem List Decreased strength;Decreased activity tolerance;Decreased balance;Decreased mobility;Cardiopulmonary status limiting activity       PT Treatment Interventions DME instruction;Gait training;Stair training;Therapeutic activities;Functional mobility training;Therapeutic exercise;Neuromuscular re-education;Balance training;Patient/family education    PT Goals (Current goals can be found in the Care Plan section)  Acute Rehab PT Goals Patient Stated Goal: to gain more independence with mobility PT Goal Formulation: With patient/family Time For Goal Achievement: 01/06/24 Potential to Achieve Goals: Good    Frequency Min 2X/week        AM-PAC PT 6 Clicks Mobility  Outcome Measure Help needed turning from your back to your side while in a flat bed without using bedrails?: A Little Help needed moving from lying on your back to sitting on the side of a flat bed without using bedrails?: A Little Help needed moving to and from a bed to a chair (including a wheelchair)?: A Little Help needed standing up from a chair using your arms (e.g., wheelchair or bedside chair)?: A Little Help needed to walk in hospital room?: Total Help needed climbing 3-5 steps with a railing? : Total 6 Click Score: 14    End of Session Equipment Utilized During Treatment: Gait belt;Oxygen  Activity Tolerance: Patient limited by fatigue Patient left: in bed;with Cline bell/phone within reach;with bed alarm set;with nursing/sitter in room Nurse Communication: Mobility status PT Visit Diagnosis: Unsteadiness on feet (  R26.81);Other abnormalities of gait and mobility (R26.89);Muscle weakness (generalized) (M62.81);History of falling (Z91.81)    Time: 8361-8283 PT Time Calculation (min) (ACUTE ONLY): 38 min   Charges:   PT Evaluation $PT Eval Moderate Complexity: 1 Mod PT Treatments $Therapeutic Exercise: 8-22  mins $Therapeutic Activity: 8-22 mins PT General Charges $$ ACUTE PT VISIT: 1 Visit         Courtney Cline, PT, DPT   Acute Rehabilitation Department Office [phone] Secure Chat Communication Preferred  Courtney Cline 12/23/2023, 5:34 PM

## 2023-12-24 DIAGNOSIS — J90 Pleural effusion, not elsewhere classified: Secondary | ICD-10-CM | POA: Diagnosis not present

## 2023-12-24 DIAGNOSIS — A419 Sepsis, unspecified organism: Secondary | ICD-10-CM | POA: Diagnosis not present

## 2023-12-24 DIAGNOSIS — I4891 Unspecified atrial fibrillation: Secondary | ICD-10-CM | POA: Diagnosis not present

## 2023-12-24 DIAGNOSIS — J189 Pneumonia, unspecified organism: Secondary | ICD-10-CM | POA: Diagnosis not present

## 2023-12-24 LAB — BASIC METABOLIC PANEL WITH GFR
Anion gap: 13 (ref 5–15)
BUN: 23 mg/dL (ref 8–23)
CO2: 29 mmol/L (ref 22–32)
Calcium: 8.5 mg/dL — ABNORMAL LOW (ref 8.9–10.3)
Chloride: 103 mmol/L (ref 98–111)
Creatinine, Ser: 1.64 mg/dL — ABNORMAL HIGH (ref 0.44–1.00)
GFR, Estimated: 30 mL/min — ABNORMAL LOW (ref >60)
Glucose, Bld: 160 mg/dL — ABNORMAL HIGH (ref 70–99)
Potassium: 3.9 mmol/L (ref 3.5–5.1)
Sodium: 145 mmol/L (ref 135–145)

## 2023-12-24 MED ORDER — CEFUROXIME AXETIL 250 MG PO TABS
250.0000 mg | ORAL_TABLET | Freq: Two times a day (BID) | ORAL | Status: DC
  Administered 2023-12-25 – 2023-12-26 (×3): 250 mg via ORAL
  Filled 2023-12-24 (×3): qty 1

## 2023-12-24 MED ORDER — AZITHROMYCIN 500 MG PO TABS
500.0000 mg | ORAL_TABLET | Freq: Every day | ORAL | Status: DC
  Administered 2023-12-25 – 2023-12-26 (×2): 500 mg via ORAL
  Filled 2023-12-24 (×2): qty 1

## 2023-12-24 MED ORDER — CEFUROXIME AXETIL 250 MG PO TABS: 500.0000 mg | ORAL_TABLET | Freq: Two times a day (BID) | ORAL | Status: DC

## 2023-12-24 NOTE — Progress Notes (Signed)
 [facility]  PROGRESS NOTE  Courtney Cline FMW:[id number] DOB: 03/06/1933 DOA: 12/21/2023 PCP: Courtney Charm, MD   Brief HPI:   89 y.o. female with medical history significant of hypertension, hyperlipidemia, atrial fibrillation, CAD, CKD 3B, CHF, breast cancer, obesity, anemia, COPD, chronic respiratory failure on 3 L presenting with worsening shortness of breath.   Patient has had 2 days of worsening shortness of breath.  Also has associated cough.  Developed significant tachycardia today.  EMS was called.   On EMS arrival patient noted to have A-fib with RVR with rates in the 200s.  By time patient arrived to the ED heart rate improved to the 140s and while in the ED spontaneously converted to sinus rhythm in the 120s. Chest x-ray showed small right pleural effusion with right lower lobe atelectasis versus airspace disease. Patient received ceftriaxone , azithromycin , 1 L IV fluids, Tylenol  in the ED.  Also started on 150 cc an hour of fluids   Assessment/Plan:    Acute on chronic hypoxemic respiratory failure -Multifactorial, due to pneumonia as well as acute diastolic CHF - Baseline oxygen  requirement 4 L/min - Patient had leukocytosis with chest x-ray showed evidence of possible airspace disease in the right lower lobe, lactic acid 2.7 - Started on Rocephin  and Zithromax  - Also diuresed very well with IV Lasix  20 mg every 12 hours - BNP elevated to 489, procalcitonin 0.94 - IV Lasix  will be changed to Lasix  40 mg p.o. daily as CHF seems to have stabilized and patient appears euvolemic  Sepsis due to pneumonia -Sepsis physiology has resolved - Lactic acid was 2.7, chest x-ray showed worsening infiltrate - Continue antibiotics as above    Atrial fibrillation with RVR - presented with A-fib with RVR.  Rate reportedly in 200s for EMS, improved to the 140s in the ED.   - spontaneously converted while being treated in the ED. - Continue home metoprolol  and  Eliquis  - Went into A-fib with RVR last night, initially started on Cardizem gtt. however blood pressure dropped -Started on IV amiodarone gtt.;  Continue amiodarone for now.  Likely will need amiodarone p.o. at discharge   Hypertension - Continue with metoprolol    Hyperlipidemia - Continue home atorvastatin    History of CVA - Continue home atorvastatin , Eliquis    CKD 3B - Creatinine stable at 1.68 in the ED. - Trend renal function and electrolytes   Acute on chronic diastolic CHF -Significantly improved with IV Lasix  - Echocardiogram obtained in September this year showed EF of 60-65 percent with grade 2 diastolic dysfunction -Diuresed well with IV Lasix   - BNP elevated 489 - IV Lasix  has been switched to Lasix  40 mg p.o. daily   History of breast cancer Obesity - Noted   Anemia - Hemoglobin stable at 7.3 - Trend CBC   COPD - Place home Trelegy with formulary Breztri  - Continue home albuterol       DVT prophylaxis: Apixaban   Medications     apixaban   2.5 mg Oral BID   atorvastatin   80 mg Oral Daily   budesonide -glycopyrrolate -formoterol   2 puff Inhalation BID   digoxin  0.25 mg Intravenous Once   furosemide   40 mg Oral Daily   guaiFENesin   600 mg Oral BID   megestrol  200 mg Oral BID   metoprolol  tartrate  25 mg Oral BID   mirtazapine   7.5 mg Oral QHS   pantoprazole   40 mg Oral BID AC   sodium chloride  flush  3 mL Intravenous Q12H  Data Reviewed:   CBG:  No results for input(s): GLUCAP in the last 168 hours.  SpO2: 98 % O2 Flow Rate (L/min): 4 L/min FiO2 (%): 36 %    Vitals:   12/24/23 0530 12/24/23 0600 12/24/23 0630 12/24/23 0750  BP: (!) 126/42 (!) 152/53 (!) 147/55   Pulse:    83  Resp:    16  Temp:      TempSrc:      SpO2:    98%  Weight:      Height:          Data Reviewed:  Basic Metabolic Panel: Recent Labs  Lab 12/21/23 1139 12/22/23 0131 12/24/23 0121  NA 142 141 145  K 4.6 4.3 3.9  CL 105 105 103  CO2 24 25  29   GLUCOSE 125* 107* 160*  BUN 18 17 23   CREATININE 1.68* 1.46* 1.64*  CALCIUM  8.9 8.4* 8.5*  MG 2.1  --   --     CBC: Recent Labs  Lab 12/21/23 1139 12/22/23 0517  WBC 14.8* 10.6*  NEUTROABS 12.2*  --   HGB 8.6* 7.3*  HCT 28.3* 24.1*  MCV 98.3 97.6  PLT 393 306    LFT Recent Labs  Lab 12/21/23 1139 12/22/23 0131  AST 26 16  ALT 17 12  ALKPHOS 50 39  BILITOT 0.8 0.5  PROT 7.0 5.4*  ALBUMIN 2.7* 2.1*     Antibiotics: Anti-infectives (From admission, onward)    Start     Dose/Rate Route Frequency Ordered Stop   12/22/23 1000  cefTRIAXone  (ROCEPHIN ) 2 g in sodium chloride  0.9 % 100 mL IVPB        2 g 200 mL/hr over 30 Minutes Intravenous Every 24 hours 12/21/23 1410 12/27/23 0959   12/22/23 1000  azithromycin  (ZITHROMAX ) 500 mg in sodium chloride  0.9 % 250 mL IVPB        500 mg 250 mL/hr over 60 Minutes Intravenous Every 24 hours 12/21/23 1410 12/27/23 0959   12/21/23 1145  cefTRIAXone  (ROCEPHIN ) 2 g in sodium chloride  0.9 % 100 mL IVPB        2 g 200 mL/hr over 30 Minutes Intravenous Once 12/21/23 1141 12/21/23 1234   12/21/23 1145  azithromycin  (ZITHROMAX ) 500 mg in sodium chloride  0.9 % 250 mL IVPB        500 mg 250 mL/hr over 60 Minutes Intravenous  Once 12/21/23 1141 12/21/23 1313        CONSULTS none  Code Status: DNR  Family Communication: No family at bedside     Subjective   Patient went into A-fib with RVR last night.  Initially started on Cardizem gtt., dropped her blood pressure then Cardizem was switched to amiodarone gtt. Heart rate is well-controlled this morning.  Breathing has significantly improved   Objective    Physical Examination:  Appears in no acute distress S1-S2, regular, no murmur auscultated Lungs clear to auscultation bilaterally Abdomen is soft, nontender, no organomegaly Extremities no edema   Status is: Inpatient:             Courtney Cline   [facility] If 7PM-7AM, please contact  night-coverage at [email or web address], Office  [phone]   12/24/2023, 9:17 AM  LOS: 3 days

## 2023-12-24 NOTE — Progress Notes (Signed)
 OT Cancellation Note  Patient Details Name: Courtney Cline MRN: [id number] DOB: 1933-10-01   Cancelled Treatment:    Reason Eval/Treat Not Completed: Other (comment) Attempted to see pt this AM, son at bedside. Offered to assist pt OOBTC to eat her breakfast as part of OT evaluation, son stating we are refusing that right now, she needs to eat. Spoke to patient, she stated she would prefer OT to return later. Will continue efforts as schedule permits.   Abagale Boulos D., MSOT, OTR/L Acute Rehabilitation Services [phone] Secure Chat Preferred  Trinia Georgi 12/24/2023, 9:22 AM

## 2023-12-24 NOTE — TOC Progression Note (Signed)
 Transition of Care [facility]) - Initial/Assessment Note    Patient Details  Name: Courtney Cline MRN: [id number] Date of Birth: 1933-04-05  Transition [facility]) CM/SW Contact:    Britt JULIANNA Bennetts, LCSW Phone Number: 12/24/2023, 4:05 PM  Clinical Narrative:                 CSW received consult for possible SNF placement at time of discharge. CSW spoke with patient.  Patient expressed understanding of PT recommendation and reports that she would like to speak with her sons prior to making a decision.   Pending patient's decision of home v/s SNF.  TOC will continue to follow.   Expected Discharge Plan:  (TBD) Barriers to Discharge: Continued Medical Work up   Patient Goals and CMS Choice            Expected Discharge Plan and Services       Living arrangements for the past 2 months: Single Family Home                                      Prior Living Arrangements/Services Living arrangements for the past 2 months: Single Family Home Lives with:: Self, Spouse Patient language and need for interpreter reviewed:: Yes Do you feel safe going back to the place where you live?: Yes      Need for Family Participation in Patient Care: Yes (Comment) Care giver support system in place?: Yes (comment) Current home services: DME (rollator, shower seat, commode, wheelchair, cpap) Criminal Activity/Legal Involvement Pertinent to Current Situation/Hospitalization: No - Comment as needed  Activities of Daily Living   ADL Screening (condition at time of admission) Independently performs ADLs?: Yes (appropriate for developmental age) Is the patient deaf or have difficulty hearing?: No Does the patient have difficulty seeing, even when wearing glasses/contacts?: No Does the patient have difficulty concentrating, remembering, or making decisions?: No  Permission Sought/Granted                  Emotional Assessment Appearance:: Developmentally  appropriate Attitude/Demeanor/Rapport: Engaged Affect (typically observed): Calm Orientation: : Oriented to Self, Oriented to Place, Oriented to  Time, Oriented to Situation Alcohol  / Substance Use: Not Applicable Psych Involvement: No (comment)  Admission diagnosis:  Pleural effusion [J90] Atrial fibrillation with RVR (HCC) [I48.91] Sepsis (HCC) [A41.9] Community acquired pneumonia of right lower lobe of lung [J18.9] Patient Active Problem List   Diagnosis Date Noted   History of CVA (cerebrovascular accident) 11/08/2023   COPD with acute exacerbation (HCC) 02/15/2023   CKD stage 3b, GFR 30-44 ml/min (HCC) 02/14/2023   Obesity (BMI 30-39.9) 02/14/2023   Normocytic anemia 02/14/2023   Myocardial injury 02/14/2023   Acute on chronic respiratory failure with hypoxia (HCC) 01/02/2022   Elevated troponin 01/02/2022   Sepsis (HCC) 01/02/2022   Aortic stenosis 05/06/2021   Malnutrition of moderate degree 02/16/2021   CAP (community acquired pneumonia) 01/21/2020   Persistent atrial fibrillation (HCC) 08/20/2019   COPD without exacerbation (HCC) 10/15/2018   Healthcare maintenance 10/15/2018   Physical deconditioning 10/15/2018   Sinusitis 01/29/2018   Diastolic CHF (HCC) 07/17/2017   Vitamin D  deficiency    Allergic rhinitis 11/23/2015   Osteopenia with high risk of fracture 11/05/2015   Post-nasal drip 03/16/2015   Anemia of chronic disease 03/10/2015   Subclavian artery stenosis, left 04/24/2014   Bilateral lower extremity edema 04/24/2014   Essential hypertension 04/24/2014  Hyperlipidemia 04/24/2014   Breast cancer of upper-inner quadrant of right female breast (HCC) 11/19/2013   Chronic hypoxemic respiratory failure (HCC) 10/02/2013   PCP:  Elliot Charm, MD Pharmacy:   [facility] - [place], [name] - [address] [name] [address] Phone: [phone] Fax: [phone]  [facility] [address] [name] [address] Phone: [phone] Fax: [phone]  [id number] [place], [name] - [address] [facility] [address] [name] [address] Phone: [phone] Fax: [phone]     Social Drivers of Health (SDOH) Social History: SDOH Screenings   Food Insecurity: No Food Insecurity (12/21/2023)  Housing: Low Risk  (12/21/2023)  Transportation Needs: No Transportation Needs (12/21/2023)  Utilities: Not At Risk (12/21/2023)  Social Connections: Socially Isolated (12/21/2023)  Tobacco Use: Medium Risk (12/21/2023)   SDOH Interventions:     Readmission Risk Interventions    02/15/2023    1:37 PM  Readmission Risk Prevention Plan  Transportation Screening Complete  PCP or Specialist Appt within 5-7 Days Complete  Home Care Screening Complete  Medication Review (RN CM) Complete

## 2023-12-24 NOTE — Plan of Care (Signed)
  Problem: Respiratory: Goal: Ability to maintain adequate ventilation will improve Outcome: Progressing   Problem: Clinical Measurements: Goal: Diagnostic test results will improve Outcome: Progressing Goal: Signs and symptoms of infection will decrease Outcome: Progressing   Problem: Fluid Volume: Goal: Hemodynamic stability will improve Outcome: Progressing   Problem: Clinical Measurements: Goal: Ability to maintain clinical measurements within normal limits will improve Outcome: Progressing Goal: Will remain free from infection Outcome: Progressing Goal: Diagnostic test results will improve Outcome: Progressing Goal: Respiratory complications will improve Outcome: Progressing Goal: Cardiovascular complication will be avoided Outcome: Progressing

## 2023-12-24 NOTE — Evaluation (Signed)
 Occupational Therapy Evaluation Patient Details Name: Courtney Cline MRN: [id number] DOB: 03-Feb-1934 Today's Date: 12/24/2023   History of Present Illness   The pt is an 89 yo female presenting 11/6 with afib with RVR. Work up concerning for sepsis, admitted for resp failure likely due to PNA. PMH includes: HTN, HLD, afib, CAD, CKD III, CHF, breast cancer, obesity, COPD on 4L O2.     Clinical Impressions Pt greeted in bed, agreeable for OT return and eval this afternoon. No family present. PTA, pt was indep with ADLs and mobility with RW. Reports she was doing light housekeeping as well. Wears 4L O2 at baseline. Today, she presents with generalized weakness and reduced activity tolerance. Required up to total A for LB ADLs and setup/min A for seated UB ADLs. Stood and took few steps towards [facility] with RW and min A. HR stable ~70-90s throughout, on amio gtt. SpO2 ~93% on 4L [place]. Despite stable hemodynamics, pt with iWOB and DOE after mobility, requiring rest period, endorsing 10/10 exertion via RPE indicating poor activity tolerance.   Pt is currently functioning below baseline and would benefit from ongoing acute OT services to progress towards safe discharge and to facilitate return to prior level of function. Current recommendation is post-acute rehab (< 3 hours/day).     If plan is discharge home, recommend the following:   A lot of help with walking and/or transfers;A lot of help with bathing/dressing/bathroom;Assistance with cooking/housework;Direct supervision/assist for medications management;Direct supervision/assist for financial management;Assist for transportation;Help with stairs or ramp for entrance     Functional Status Assessment   Patient has had a recent decline in their functional status and demonstrates the ability to make significant improvements in function in a reasonable and predictable amount of time.     Equipment Recommendations   Other (comment) (defer  to next level of care)     Recommendations for Other Services         Precautions/Restrictions   Precautions Precautions: Fall Recall of Precautions/Restrictions: Impaired Precaution/Restrictions Comments: monitor HR, 4L O2 at baseline Restrictions Weight Bearing Restrictions Per Provider Order: No     Mobility Bed Mobility Overal bed mobility: Needs Assistance Bed Mobility: Supine to Sit, Sit to Supine     Supine to sit: Contact guard, HOB elevated, Used rails Sit to supine: Mod assist, HOB elevated, Used rails   General bed mobility comments: assist for BLE mgmt into supine, incr effort/time needed to transition EOB and for seated scooting    Transfers Overall transfer level: Needs assistance Equipment used: Rolling walker (2 wheels) Transfers: Sit to/from Stand Sit to Stand: Min assist           General transfer comment: Stood from bed with cues for safe hand placement and sequencing. Min A to take several steps towards [facility], assist for RW mgmt and stability.      Balance Overall balance assessment: Needs assistance Sitting-balance support: Bilateral upper extremity supported, Feet supported Sitting balance-Leahy Scale: Fair Sitting balance - Comments: seated EOB, intermittent CGA/close SUP   Standing balance support: Bilateral upper extremity supported, During functional activity, Reliant on assistive device for balance Standing balance-Leahy Scale: Poor Standing balance comment: reliant on BUE support via RW and min A for stability                           ADL either performed or assessed with clinical judgement   ADL Overall ADL's : Needs assistance/impaired Eating/Feeding: Set up;Sitting  Grooming: Set up;Sitting   Upper Body Bathing: Minimal assistance   Lower Body Bathing: Maximal assistance   Upper Body Dressing : Moderate assistance   Lower Body Dressing: Total assistance       Toileting- Clothing Manipulation and  Hygiene: Total assistance Toileting - Clothing Manipulation Details (indicate cue type and reason): purewick intact, anticipate incr A needed for other components of toileting 2/2 functional deficits     Functional mobility during ADLs: Minimal assistance;Cueing for safety;Cueing for sequencing;Rolling walker (2 wheels)       Vision Baseline Vision/History: 1 Wears glasses Ability to See in Adequate Light: 0 Adequate Patient Visual Report: No change from baseline       Perception         Praxis         Pertinent Vitals/Pain Pain Assessment Pain Assessment: No/denies pain Pain Score: 0-No pain     Extremity/Trunk Assessment Upper Extremity Assessment Upper Extremity Assessment: Generalized weakness;Right hand dominant (B shoulder AROM limited past 90*, pt reporting arthritis and stiffness)   Lower Extremity Assessment Lower Extremity Assessment: Defer to PT evaluation       Communication Communication Communication: No apparent difficulties   Cognition Arousal: Alert Behavior During Therapy: WFL for tasks assessed/performed Cognition: No apparent impairments                               Following commands: Intact       Cueing  General Comments   Cueing Techniques: Verbal cues;Visual cues  HR 70-90 throughout, SpO2 93% on 4L [place], on amio gtt for rate control   Exercises     Shoulder Instructions      Home Living Family/patient expects to be discharged to:: Private residence Living Arrangements: Spouse/significant other Available Help at Discharge: Family;Available PRN/intermittently (husband unable to provide assistance/supervision, 2 sons live locally and can assist PRN (one works, one is retired)) Type of Home: [employer] Access: Stairs to enter Secretary/administrator of Steps: 3-4 STE Entrance Stairs-Rails: Right;Left Home Layout: One level     Bathroom Shower/Tub: Chief Strategy Officer: Standard     Home Equipment:  Agricultural Consultant (2 wheels);Shower seat;Wheelchair - manual;BSC/3in1;Grab bars - tub/shower   Additional Comments: lives with husband (pt reports he is currently dealing with some health problems and cannot provide physical assistance)  Lives With: Spouse    Prior Functioning/Environment Prior Level of Function : Independent/Modified Independent             Mobility Comments: uses RW for all mobility ADLs Comments: indep with ADLs and light IADLs, manages her own medications without a pillbox    OT Problem List: Decreased range of motion;Decreased strength;Decreased activity tolerance;Impaired balance (sitting and/or standing);Decreased coordination;Decreased cognition;Decreased safety awareness;Cardiopulmonary status limiting activity;Impaired UE functional use   OT Treatment/Interventions: Self-care/ADL training;Therapeutic exercise;Energy conservation;DME and/or AE instruction;Therapeutic activities;Patient/family education;Balance training      OT Goals(Current goals can be found in the care plan section)   Acute Rehab OT Goals Patient Stated Goal: get back to doing what I need to OT Goal Formulation: With patient Time For Goal Achievement: 01/07/24 Potential to Achieve Goals: Good   OT Frequency:  Min 2X/week    Co-evaluation              AM-PAC OT 6 Clicks Daily Activity     Outcome Measure Help from another person eating meals?: None Help from another person taking care of personal grooming?: A  Little Help from another person toileting, which includes using toliet, bedpan, or urinal?: Total Help from another person bathing (including washing, rinsing, drying)?: A Lot Help from another person to put on and taking off regular upper body clothing?: A Lot Help from another person to put on and taking off regular lower body clothing?: Total 6 Click Score: 13   End of Session Equipment Utilized During Treatment: Rolling walker (2 wheels);Oxygen  Nurse  Communication: Mobility status  Activity Tolerance: Patient limited by fatigue;Patient tolerated treatment well Patient left: in bed;with call bell/phone within reach;with bed alarm set  OT Visit Diagnosis: Unsteadiness on feet (R26.81);Other abnormalities of gait and mobility (R26.89);Muscle weakness (generalized) (M62.81);History of falling (Z91.81)                Time: 8745-8682 OT Time Calculation (min): 23 min Charges:  OT General Charges $OT Visit: 1 Visit OT Evaluation $OT Eval Moderate Complexity: 1 Mod  Rikki BIRCH., MSOT, OTR/L Acute Rehabilitation Services [phone] Secure Chat Preferred  Rikki Milch 12/24/2023, 3:16 PM

## 2023-12-25 DIAGNOSIS — J189 Pneumonia, unspecified organism: Secondary | ICD-10-CM | POA: Diagnosis not present

## 2023-12-25 DIAGNOSIS — I480 Paroxysmal atrial fibrillation: Secondary | ICD-10-CM | POA: Diagnosis not present

## 2023-12-25 DIAGNOSIS — A419 Sepsis, unspecified organism: Secondary | ICD-10-CM | POA: Diagnosis not present

## 2023-12-25 DIAGNOSIS — J90 Pleural effusion, not elsewhere classified: Secondary | ICD-10-CM | POA: Diagnosis not present

## 2023-12-25 DIAGNOSIS — I4891 Unspecified atrial fibrillation: Secondary | ICD-10-CM | POA: Diagnosis not present

## 2023-12-25 LAB — BASIC METABOLIC PANEL WITH GFR
Anion gap: 9 (ref 5–15)
BUN: 21 mg/dL (ref 8–23)
CO2: 30 mmol/L (ref 22–32)
Calcium: 8.4 mg/dL — ABNORMAL LOW (ref 8.9–10.3)
Chloride: 104 mmol/L (ref 98–111)
Creatinine, Ser: 1.62 mg/dL — ABNORMAL HIGH (ref 0.44–1.00)
GFR, Estimated: 30 mL/min — ABNORMAL LOW (ref >60)
Glucose, Bld: 100 mg/dL — ABNORMAL HIGH (ref 70–99)
Potassium: 3.4 mmol/L — ABNORMAL LOW (ref 3.5–5.1)
Sodium: 143 mmol/L (ref 135–145)

## 2023-12-25 LAB — CBC
HCT: 24.8 % — ABNORMAL LOW (ref 36.0–46.0)
Hemoglobin: 7.4 g/dL — ABNORMAL LOW (ref 12.0–15.0)
MCH: 28.9 pg (ref 26.0–34.0)
MCHC: 29.8 g/dL — ABNORMAL LOW (ref 30.0–36.0)
MCV: 96.9 fL (ref 80.0–100.0)
Platelets: 363 10*3/uL (ref 150–400)
RBC: 2.56 MIL/uL — ABNORMAL LOW (ref 3.87–5.11)
RDW: 15.2 % (ref 11.5–15.5)
WBC: 7.5 10*3/uL (ref 4.0–10.5)
nRBC: 0 % (ref 0.0–0.2)

## 2023-12-25 LAB — MAGNESIUM: Magnesium: 2 mg/dL (ref 1.7–2.4)

## 2023-12-25 MED ORDER — POTASSIUM CHLORIDE 20 MEQ PO PACK
40.0000 meq | PACK | Freq: Once | ORAL | Status: AC
  Administered 2023-12-25: 40 meq via ORAL
  Filled 2023-12-25: qty 2

## 2023-12-25 MED ORDER — MELATONIN 3 MG PO TABS
3.0000 mg | ORAL_TABLET | Freq: Every evening | ORAL | Status: DC | PRN
  Administered 2023-12-25 – 2023-12-31 (×2): 3 mg via ORAL
  Filled 2023-12-25 (×2): qty 1

## 2023-12-25 NOTE — Plan of Care (Signed)
  Problem: Clinical Measurements: Goal: Diagnostic test results will improve Outcome: Progressing Goal: Signs and symptoms of infection will decrease Outcome: Progressing   Problem: Respiratory: Goal: Ability to maintain adequate ventilation will improve Outcome: Progressing   Problem: Clinical Measurements: Goal: Ability to maintain clinical measurements within normal limits will improve Outcome: Progressing Goal: Will remain free from infection Outcome: Progressing Goal: Diagnostic test results will improve Outcome: Progressing Goal: Respiratory complications will improve Outcome: Progressing Goal: Cardiovascular complication will be avoided Outcome: Progressing

## 2023-12-25 NOTE — Progress Notes (Signed)
 [facility]  PROGRESS NOTE  Courtney Cline FMW:[id number] DOB: 09/01/1933 DOA: 12/21/2023 PCP: Elliot Charm, MD   Brief HPI:   89 y.o. female with medical history significant of hypertension, hyperlipidemia, atrial fibrillation, CAD, CKD 3B, CHF, breast cancer, obesity, anemia, COPD, chronic respiratory failure on 3 L presenting with worsening shortness of breath.   Patient has had 2 days of worsening shortness of breath.  Also has associated cough.  Developed significant tachycardia today.  EMS was called.   On EMS arrival patient noted to have A-fib with RVR with rates in the 200s.  By time patient arrived to the ED heart rate improved to the 140s and while in the ED spontaneously converted to sinus rhythm in the 120s. Chest x-ray showed small right pleural effusion with right lower lobe atelectasis versus airspace disease. Patient received ceftriaxone , azithromycin , 1 L IV fluids, Tylenol  in the ED.  Also started on 150 cc an hour of fluids   Assessment/Plan:    Acute on chronic hypoxemic respiratory failure -Multifactorial, due to pneumonia as well as acute diastolic CHF - Baseline oxygen  requirement 4 L/min - Patient had leukocytosis with chest x-ray showed evidence of possible airspace disease in the right lower lobe, lactic acid 2.7 - Started on Rocephin  and Zithromax ; antibiotics changed to cefuroxime and Zithromax  - Also diuresed very well with IV Lasix  20 mg every 12 hours - BNP elevated to 489, procalcitonin 0.94 - IV Lasix  will be changed to Lasix  40 mg p.o. daily as CHF seems to have stabilized and patient appears euvolemic  Sepsis due to pneumonia -Sepsis physiology has resolved - Lactic acid was 2.7, chest x-ray showed worsening infiltrate - Continue antibiotics cefuroxime and Flomax    Atrial fibrillation with RVR - presented with A-fib with RVR.  Rate reportedly in 200s for EMS, improved to the 140s in the ED.   - spontaneously converted while  being treated in the ED. - Continue home metoprolol  and Eliquis  - Went into A-fib with RVR last night, initially started on Cardizem gtt. however blood pressure dropped -Started on IV amiodarone gtt.;  Continue amiodarone for now.  Likely will need amiodarone p.o. at discharge -Will consult cardiology for adjustment of amiodarone dose and further recommendations   Hypertension - Continue with metoprolol    Hyperlipidemia - Continue home atorvastatin    History of CVA - Continue home atorvastatin , Eliquis    CKD 3B - Creatinine stable at 1.68 in the ED. - Trend renal function and electrolytes   Acute on chronic diastolic CHF -Significantly improved with IV Lasix  - Echocardiogram obtained in September this year showed EF of 60-65 percent with grade 2 diastolic dysfunction -Diuresed well with IV Lasix   - BNP elevated 489 - IV Lasix  has been switched to Lasix  40 mg p.o. daily   History of breast cancer Obesity - Noted   Anemia - Hemoglobin stable at 7.3 - Trend CBC   COPD - Place home Trelegy with formulary Breztri  - Continue home albuterol       DVT prophylaxis: Apixaban   Medications     apixaban   2.5 mg Oral BID   atorvastatin   80 mg Oral Daily   azithromycin   500 mg Oral Daily   budesonide -glycopyrrolate -formoterol   2 puff Inhalation BID   cefUROXime  250 mg Oral BID WC   digoxin  0.25 mg Intravenous Once   furosemide   40 mg Oral Daily   guaiFENesin   600 mg Oral BID   megestrol  200 mg Oral BID   metoprolol  tartrate  25 mg Oral BID   mirtazapine   7.5 mg Oral QHS   pantoprazole   40 mg Oral BID AC   sodium chloride  flush  3 mL Intravenous Q12H     Data Reviewed:   CBG:  No results for input(s): GLUCAP in the last 168 hours.  SpO2: 99 % O2 Flow Rate (L/min): 4 L/min FiO2 (%): 36 %    Vitals:   12/25/23 0023 12/25/23 0400 12/25/23 0713 12/25/23 0759  BP: 119/64 (!) 111/53 123/61   Pulse: (!) 102 (!) 101 98   Resp: 16 15 16    Temp: 98.3 F (36.8  C) 98 F (36.7 C) 98.6 F (37 C)   TempSrc: Oral Axillary Oral   SpO2: 97% 99% 99% 99%  Weight:      Height:          Data Reviewed:  Basic Metabolic Panel: Recent Labs  Lab 12/21/23 1139 12/22/23 0131 12/24/23 0121 12/25/23 0242  NA 142 141 145 143  K 4.6 4.3 3.9 3.4*  CL 105 105 103 104  CO2 24 25 29 30   GLUCOSE 125* 107* 160* 100*  BUN 18 17 23 21   CREATININE 1.68* 1.46* 1.64* 1.62*  CALCIUM  8.9 8.4* 8.5* 8.4*  MG 2.1  --   --  2.0    CBC: Recent Labs  Lab 12/21/23 1139 12/22/23 0517 12/25/23 0242  WBC 14.8* 10.6* 7.5  NEUTROABS 12.2*  --   --   HGB 8.6* 7.3* 7.4*  HCT 28.3* 24.1* 24.8*  MCV 98.3 97.6 96.9  PLT 393 306 363    LFT Recent Labs  Lab 12/21/23 1139 12/22/23 0131  AST 26 16  ALT 17 12  ALKPHOS 50 39  BILITOT 0.8 0.5  PROT 7.0 5.4*  ALBUMIN 2.7* 2.1*     Antibiotics: Anti-infectives (From admission, onward)    Start     Dose/Rate Route Frequency Ordered Stop   12/25/23 1000  azithromycin  (ZITHROMAX ) tablet 500 mg        500 mg Oral Daily 12/24/23 1157     12/25/23 0800  cefUROXime (CEFTIN) tablet 500 mg  Status:  Discontinued        500 mg Oral 2 times daily with meals 12/24/23 1158 12/24/23 1540   12/25/23 0800  cefUROXime (CEFTIN) tablet 250 mg        250 mg Oral 2 times daily with meals 12/24/23 1540     12/22/23 1000  cefTRIAXone  (ROCEPHIN ) 2 g in sodium chloride  0.9 % 100 mL IVPB  Status:  Discontinued        2 g 200 mL/hr over 30 Minutes Intravenous Every 24 hours 12/21/23 1410 12/24/23 1157   12/22/23 1000  azithromycin  (ZITHROMAX ) 500 mg in sodium chloride  0.9 % 250 mL IVPB  Status:  Discontinued        500 mg 250 mL/hr over 60 Minutes Intravenous Every 24 hours 12/21/23 1410 12/24/23 1157   12/21/23 1145  cefTRIAXone  (ROCEPHIN ) 2 g in sodium chloride  0.9 % 100 mL IVPB        2 g 200 mL/hr over 30 Minutes Intravenous Once 12/21/23 1141 12/21/23 1234   12/21/23 1145  azithromycin  (ZITHROMAX ) 500 mg in sodium chloride   0.9 % 250 mL IVPB        500 mg 250 mL/hr over 60 Minutes Intravenous  Once 12/21/23 1141 12/21/23 1313        CONSULTS none  Code Status: DNR  Family Communication: Discussed with patient's son at bedside  Subjective   Denies shortness of breath   Objective    Physical Examination:  Appears in no acute distress S1-S2, regular, no murmur auscultated Lungs are clear to auscultation bilaterally Abdomen is soft, nontender, no organomegaly Extremities no edema   Status is: Inpatient:             Courtney Cline   [facility] If 7PM-7AM, please contact night-coverage at [email or web address], Office  [phone]   12/25/2023, 8:49 AM  LOS: 4 days

## 2023-12-25 NOTE — Plan of Care (Signed)

## 2023-12-25 NOTE — Progress Notes (Signed)
 Physical Therapy Treatment Patient Details Name: Courtney Cline MRN: [id number] DOB: 1933-08-08 Today's Date: 12/25/2023   History of Present Illness The pt is an 89 yo female presenting 11/6 with afib with RVR. Work up concerning for sepsis, admitted for resp failure likely due to PNA. PMH includes: HTN, HLD, afib, CAD, CKD III, CHF, breast cancer, obesity, COPD on 4L O2.    PT Comments  Pt is progressing well towards goals. Currently pt is Min A to CGA for bed mobility, CGA for sit to stand and Min A intermittently during gait. Pt unable to ambulate functional distances greater than 12 ft due to fatigue/weakness. Due to pt current functional status, home set up and available assistance at home recommending skilled physical therapy services < 3 hours/day in order to address strength, balance and functional mobility to decrease risk for falls, injury, immobility, skin break down and re-hospitalization.      If plan is discharge home, recommend the following: A lot of help with walking and/or transfers;A lot of help with bathing/dressing/bathroom;Assist for transportation;Help with stairs or ramp for entrance;Assistance with cooking/housework   Can travel by private vehicle     No  Equipment Recommendations  None recommended by PT       Precautions / Restrictions Precautions Precautions: Fall Recall of Precautions/Restrictions: Impaired Precaution/Restrictions Comments: monitor HR, 4L O2 at baseline Restrictions Weight Bearing Restrictions Per Provider Order: No     Mobility  Bed Mobility Overal bed mobility: Needs Assistance Bed Mobility: Supine to Sit, Sit to Supine     Supine to sit: Contact guard, HOB elevated, Used rails Sit to supine: Min assist, Used rails, HOB elevated   General bed mobility comments: assist for BLE mgmt into supine, incr effort/time needed to transition EOB and for seated scooting    Transfers Overall transfer level: Needs  assistance Equipment used: Rolling walker (2 wheels) Transfers: Sit to/from Stand Sit to Stand: Contact guard assist           General transfer comment: Stood from bed with occasional cues for safe hand placement and sequencing.    Ambulation/Gait Ambulation/Gait assistance: Min assist Gait Distance (Feet): 12 Feet Assistive device: Rolling walker (2 wheels) Gait Pattern/deviations: Step-to pattern, Decreased stride length, Trunk flexed Gait velocity: decreased Gait velocity interpretation: <1.31 ft/sec, indicative of household ambulator   General Gait Details: very small steps, low floor clearance, limited by fatigue.     Balance Overall balance assessment: Needs assistance Sitting-balance support: Bilateral upper extremity supported, Feet supported Sitting balance-Leahy Scale: Fair Sitting balance - Comments: seated EOB supervision   Standing balance support: Bilateral upper extremity supported, During functional activity, Reliant on assistive device for balance Standing balance-Leahy Scale: Poor Standing balance comment: reliant on BUE support via RW and min A for stability      Communication Communication Communication: No apparent difficulties  Cognition Arousal: Alert Behavior During Therapy: WFL for tasks assessed/performed   PT - Cognitive impairments: No apparent impairments       Following commands: Intact      Cueing Cueing Techniques: Verbal cues, Visual cues     General Comments General comments (skin integrity, edema, etc.): HR 88 bpm initially up to 108 bpm during gait.      Pertinent Vitals/Pain Pain Assessment Pain Assessment: No/denies pain     PT Goals (current goals can now be found in the care plan section) Acute Rehab PT Goals Patient Stated Goal: to gain more independence with mobility PT Goal Formulation: With patient/family Time For  Goal Achievement: 01/06/24 Potential to Achieve Goals: Good Progress towards PT goals:  Progressing toward goals    Frequency    Min 2X/week      PT Plan  Continue with current POC        AM-PAC PT 6 Clicks Mobility   Outcome Measure  Help needed turning from your back to your side while in a flat bed without using bedrails?: A Little Help needed moving from lying on your back to sitting on the side of a flat bed without using bedrails?: A Little Help needed moving to and from a bed to a chair (including a wheelchair)?: A Little Help needed standing up from a chair using your arms (e.g., wheelchair or bedside chair)?: A Little Help needed to walk in hospital room?: Total Help needed climbing 3-5 steps with a railing? : Total 6 Click Score: 14    End of Session Equipment Utilized During Treatment: Gait belt;Oxygen  Activity Tolerance: Patient limited by fatigue Patient left: in bed;with call bell/phone within reach;with bed alarm set Nurse Communication: Mobility status PT Visit Diagnosis: Unsteadiness on feet (R26.81);Other abnormalities of gait and mobility (R26.89);Muscle weakness (generalized) (M62.81);History of falling (Z91.81)     Time: 8459-8442 PT Time Calculation (min) (ACUTE ONLY): 17 min  Charges:    $Therapeutic Activity: 8-22 mins PT General Charges $$ ACUTE PT VISIT: 1 Visit                     Dorothyann Maier, DPT, CLT  Acute Rehabilitation Services Office: [phone] (Secure chat preferred)    Dorothyann VEAR Maier 12/25/2023, 4:03 PM

## 2023-12-25 NOTE — Consult Note (Addendum)
 Cardiology Consultation   Patient ID: Courtney Cline MRN: [id number]; DOB: [birth year]  Admit date: 12/21/2023 Date of Consult: 12/25/2023  PCP:  Elliot Charm, MD   [facility] Providers Cardiologist:  Dorn Lesches, MD     Patient Profile: Courtney Cline is a 89 y.o. female with a hx of hypertension, hyperlipidemia, chronic HFpEF, persistent atrial fibrillation, nonrheumatic aortic stenosis, left subclavian artery stenosis, history of breast CA, COPD on home O2, GERD, mitral stenosis who is being seen 12/25/2023 for the evaluation of atrial fibrillation at the request of Dr Drusilla.  History of Present Illness: Courtney Cline is an 89 year old female with past medical history noted above.  She is followed by Dr. Lesches as an outpatient, seen in the clinic 06/2023 for follow-up and noted to be stable with trace lower extremity edema on chronic oxygen  for her COPD.  She was in sinus rhythm at this visit.  She was continued on Eliquis  2.5 mg twice daily and Lopressor  25 mg twice daily.  Admitted to [facility] 11/04/2023 with altered mental status, confusion, dysarthria and concern for stroke.  Seen by neurology who felt this was secondary to metabolic encephalopathy in the setting of sepsis secondary to acute cystitis as opposed to stroke/TIA after reviewing CT.  Underwent brain MRI which showed acute ischemic infarct.  This was presumed to be embolic in the setting of noncompliance with her anticoagulation.  She reported times where she had only taken her Eliquis  once a day in the setting of nosebleeds.  She was discharged to [facility] re[facility].  Cardiology was asked to evaluate 11/09/2023 whenever patient's heart rate elevated into the 150s and sustained.  EKG showed atrial fibrillation with a heart rate of 140s.  Her Lopressor  was increased to 25 mg twice daily and noted to likely be back in sinus rhythm the following day.  She was discharged from rehab on  10/9.  Presented to the ED on 11/6 with worsening shortness of breath and associated cough.  She was found to be septic with a lactic acid of 2.7 and chest x-ray with worsening infiltrate secondary to pneumonia.  She was treated with Rocephin  and Zithromax .  BNP was also elevated and received IV Lasix . HR was reported in the 200s possibly with EMS, but run sheet showed rates in the 140s.  Spontaneously converted to sinus rhythm while in the ED.  She was continued on home metoprolol  and Eliquis .  Developed atrial fibrillation with RVR the evening of 11/9 and initially placed on a Cardizem drip but blood pressures became soft.  This was discontinued and transitioned over to IV amiodarone.  Cardiology now asked to evaluate.   In talking with patient, she reports being generally unaware of her her elevated rhythm, maybe some palpitations at times.    Past Medical History:  Diagnosis Date   Acute cystitis without hematuria    Acute metabolic encephalopathy 11/04/2023   AKI (acute kidney injury) 02/15/2021   Allergic rhinitis    Arthritis    Breast calcification seen on mammogram    Right breast   Breast cancer (HCC)    COPD (chronic obstructive pulmonary disease) (HCC)    GERD (gastroesophageal reflux disease)    Hyperglycemia    Hyperlipidemia    Hypertension    Low back pain    On home oxygen  therapy    all the time   Osteopenia    Peripheral edema    Shortness of breath    Subclavian artery stenosis, left  Vitamin D  deficiency    Wears dentures    top   Wears glasses    reading    Past Surgical History:  Procedure Laterality Date   BREAST BIOPSY     BREAST LUMPECTOMY     right 2015   BREAST LUMPECTOMY WITH RADIOACTIVE SEED LOCALIZATION Right 12/20/2013   Procedure: RIGHT BREAST LUMPECTOMY WITH RADIOACTIVE SEED LOCALIZATION;  Surgeon: Debby Shipper, MD;  Location: [facility];  Service: General;  Laterality: Right;   CAROTID DUPLEX  2014   CATARACT  EXTRACTION     both eyes     Scheduled Meds:  apixaban   2.5 mg Oral BID   atorvastatin   80 mg Oral Daily   azithromycin   500 mg Oral Daily   budesonide -glycopyrrolate -formoterol   2 puff Inhalation BID   cefUROXime  250 mg Oral BID WC   furosemide   40 mg Oral Daily   guaiFENesin   600 mg Oral BID   megestrol  200 mg Oral BID   metoprolol  tartrate  25 mg Oral BID   mirtazapine   7.5 mg Oral QHS   pantoprazole   40 mg Oral BID AC   sodium chloride  flush  3 mL Intravenous Q12H   Continuous Infusions:  amiodarone 30 mg/hr (12/25/23 0022)   diltiazem (CARDIZEM) infusion Stopped (12/23/23 1936)   PRN Meds: acetaminophen  **OR** acetaminophen , albuterol , melatonin, polyethylene glycol  Allergies:   No Known Allergies  Social History:   Social History   Socioeconomic History   Marital status: Married    Spouse name: [name]   Number of children: 2   Years of education: Not on file   Highest education level: Not on file  Occupational History   Occupation: retired    Comment: Programmer, [employer]  Tobacco Use   Smoking status: Former    Current packs/day: 0.00    Average packs/day: 1 pack/day for 40.0 years (40.0 ttl pk-yrs)    Types: Cigarettes    Start date: 02/14/1954    Quit date: 02/14/1994    Years since quitting: 29.8   Smokeless tobacco: Never  Vaping Use   Vaping status: Never Used  Substance and Sexual Activity   Alcohol  use: No   Drug use: No   Sexual activity: Not on file  Other Topics Concern   Not on file  Social History Narrative   Not on file   Social Drivers of Health   Financial Resource Strain: Not on file  Food Insecurity: No Food Insecurity (12/21/2023)   Hunger Vital Sign    Worried About Running Out of Food in the Last Year: Never true    Ran Out of Food in the Last Year: Never true  Transportation Needs: No Transportation Needs (12/21/2023)   PRAPARE - Administrator, Civil Service (Medical): No    Lack of Transportation (Non-Medical): No   Physical Activity: Not on file  Stress: Not on file  Social Connections: Socially Isolated (12/21/2023)   Social Connection and Isolation Panel    Frequency of Communication with Friends and Family: Once a week    Frequency of Social Gatherings with Friends and Family: Once a week    Attends Religious Services: Never    Database Administrator or Organizations: No    Attends Banker Meetings: Never    Marital Status: Married  Catering Manager Violence: Not At Risk (12/21/2023)   Humiliation, Afraid, Rape, and Kick questionnaire    Fear of Current or Ex-Partner: No    Emotionally Abused:  No    Physically Abused: No    Sexually Abused: No    Family History:    Family History  Problem Relation Age of Onset   Emphysema Mother    Emphysema Maternal Grandfather      ROS:  Please see the history of present illness.   All other ROS reviewed and negative.     Physical Exam/Data: Vitals:   12/25/23 0400 12/25/23 0713 12/25/23 0759 12/25/23 1115  BP: (!) 111/53 123/61    Pulse: (!) 101 98 97 98  Resp: 15 16 18 18   Temp: 98 F (36.7 C) 98.6 F (37 C)  98 F (36.7 C)  TempSrc: Axillary Oral  Oral  SpO2: 99% 99% 99% 99%  Weight:      Height:        Intake/Output Summary (Last 24 hours) at 12/25/2023 1153 Last data filed at 12/25/2023 0400 Gross per 24 hour  Intake 837.78 ml  Output 1350 ml  Net -512.22 ml      12/21/2023   11:47 AM 11/23/2023    5:00 AM 11/22/2023    6:07 AM  Last 3 Weights  Weight (lbs) 150 lb 150 lb 12.7 oz 151 lb 1.6 oz  Weight (kg) 68.04 kg 68.4 kg 68.539 kg     Body mass index is 29.29 kg/m.  General:  Frail older female, on [place] @4L  HEENT: normal Neck: no JVD Vascular: No carotid bruits; Distal pulses 2+ bilaterally Cardiac:  normal S1, S2; RRR; 2/6 + systolic murmur LLSB Lungs:  Diminished in bases, mild wheezing Abd: soft, nontender, no hepatomegaly  Ext: no edema Musculoskeletal:  No deformities, BUE and BLE strength normal  and equal Skin: warm and dry  Neuro: no focal abnormalities noted Psych:  Normal affect   EKG:  The EKG was personally reviewed and demonstrates:  Atrial fibrillation, 144bpm Telemetry:  Telemetry was personally reviewed and demonstrates:  developed afib rvr around 2200, then converted back to sinus rhythm around 0830  Relevant CV Studies:  Echo: 10/2023  IMPRESSIONS    1. Left ventricular ejection fraction, by estimation, is 60 to 65%. The  left ventricle has normal function. The left ventricle has no regional  wall motion abnormalities. Left ventricular diastolic parameters are  consistent with Grade II diastolic  dysfunction (pseudonormalization).   2. Right ventricular systolic function is normal. The right ventricular  size is normal. There is severely elevated pulmonary artery systolic  pressure. The estimated right ventricular systolic pressure is 67.0 mmHg.   3. Left atrial size was moderately dilated.   4. Negative bubble study, no evidence for PFO or ASD.   5. MVA 1.08 cm^2 by VTI. The mitral valve is degenerative. Trivial mitral  valve regurgitation. Severe mitral stenosis. The mean mitral valve  gradient is 9.0 mmHg. Moderate to severe mitral annular calcification.   6. Tricuspid valve regurgitation is moderate.   7. The aortic valve is tricuspid. Aortic valve regurgitation is trivial.  Moderate aortic valve stenosis. Aortic valve area, by VTI measures 1.21  cm. Aortic valve mean gradient measures 17.0 mmHg.   8. The inferior vena cava is normal in size with greater than 50%  respiratory variability, suggesting right atrial pressure of 3 mmHg.   FINDINGS   Left Ventricle: Left ventricular ejection fraction, by estimation, is 60  to 65%. The left ventricle has normal function. The left ventricle has no  regional wall motion abnormalities. The left ventricular internal cavity  size was normal in size. There is  no left ventricular hypertrophy. Left ventricular  diastolic parameters  are consistent with Grade II diastolic dysfunction (pseudonormalization).   Right Ventricle: The right ventricular size is normal. No increase in  right ventricular wall thickness. Right ventricular systolic function is  normal. There is severely elevated pulmonary artery systolic pressure. The  tricuspid regurgitant velocity is  4.00 m/s, and with an assumed right atrial pressure of 3 mmHg, the  estimated right ventricular systolic pressure is 67.0 mmHg.   Left Atrium: Left atrial size was moderately dilated.   Right Atrium: Right atrial size was normal in size.   Pericardium: There is no evidence of pericardial effusion.   Mitral Valve: MVA 1.08 cm^2 by VTI. The mitral valve is degenerative in  appearance. There is severe calcification of the mitral valve leaflet(s).  Moderate to severe mitral annular calcification. Trivial mitral valve  regurgitation. Severe mitral valve  stenosis. MV peak gradient, 16.5 mmHg. The mean mitral valve gradient is  9.0 mmHg.   Tricuspid Valve: The tricuspid valve is normal in structure. Tricuspid  valve regurgitation is moderate.   Aortic Valve: The aortic valve is tricuspid. Aortic valve regurgitation is  trivial. Moderate aortic stenosis is present. Aortic valve mean gradient  measures 17.0 mmHg. Aortic valve peak gradient measures 24.9 mmHg. Aortic  valve area, by VTI measures 1.21   cm.   Pulmonic Valve: The pulmonic valve was normal in structure. Pulmonic valve  regurgitation is not visualized.   Aorta: The aortic root is normal in size and structure.   Venous: The inferior vena cava is normal in size with greater than 50%  respiratory variability, suggesting right atrial pressure of 3 mmHg.   IAS/Shunts: Negative bubble study, no evidence for PFO or ASD. Agitated  saline contrast was given intravenously to evaluate for intracardiac  shunting.   Laboratory Data: High Sensitivity Troponin:  No results for  input(s): TROPONINIHS in the last 720 hours.   Chemistry Recent Labs  Lab 12/21/23 1139 12/22/23 0131 12/24/23 0121 12/25/23 0242  NA 142 141 145 143  K 4.6 4.3 3.9 3.4*  CL 105 105 103 104  CO2 24 25 29 30   GLUCOSE 125* 107* 160* 100*  BUN 18 17 23 21   CREATININE 1.68* 1.46* 1.64* 1.62*  CALCIUM  8.9 8.4* 8.5* 8.4*  MG 2.1  --   --  2.0  GFRNONAA 29* 34* 30* 30*  ANIONGAP 13 11 13 9     Recent Labs  Lab 12/21/23 1139 12/22/23 0131  PROT 7.0 5.4*  ALBUMIN 2.7* 2.1*  AST 26 16  ALT 17 12  ALKPHOS 50 39  BILITOT 0.8 0.5   Lipids No results for input(s): CHOL, TRIG, HDL, LABVLDL, LDLCALC, CHOLHDL in the last 168 hours.  Hematology Recent Labs  Lab 12/21/23 1139 12/22/23 0517 12/25/23 0242  WBC 14.8* 10.6* 7.5  RBC 2.88* 2.47* 2.56*  HGB 8.6* 7.3* 7.4*  HCT 28.3* 24.1* 24.8*  MCV 98.3 97.6 96.9  MCH 29.9 29.6 28.9  MCHC 30.4 30.3 29.8*  RDW 15.8* 15.6* 15.2  PLT 393 306 363   Thyroid  No results for input(s): TSH, FREET4 in the last 168 hours.  BNP Recent Labs  Lab 12/22/23 0517  BNP 489.6*    DDimer No results for input(s): DDIMER in the last 168 hours.  Radiology/Studies:  DG Chest Port 1 View Result Date: 12/21/2023 EXAM: 1 VIEW(S) XRAY OF THE CHEST 12/21/2023 12:24:00 PM COMPARISON: 11/21/2023 CLINICAL HISTORY: Questionable sepsis - evaluate for abnormality FINDINGS: LUNGS AND PLEURA: Background  of emphysema and chronic interstitial coarsening. Small right pleural effusion and right lung base atelectasis or air space disease. Small left pleural effusion has decreased from previous exam. No pulmonary edema. No pneumothorax. HEART AND MEDIASTINUM: Aortic calcification. BONES AND SOFT TISSUES: No acute osseous abnormality. IMPRESSION: 1. Small right pleural effusion with right lung base atelectasis or air space disease. Electronically signed by: Waddell Calk MD 12/21/2023 01:20 PM EST RP Workstation: [id number]     Assessment and  Plan:  Jehieli Brassell is a 89 y.o. female with a hx of hypertension, hyperlipidemia, chronic HFpEF, persistent atrial fibrillation, nonrheumatic aortic stenosis, left subclavian artery stenosis, history of breast CA, COPD on home O2, GERD who is being seen 12/25/2023 for the evaluation of atrial fibrillation at the request of Dr Drusilla.  Atrial fibrillation with RVR Hx of persistent atrial fibrillation -- Reportedly had elevated HR with EMS, noted at 140s on arrival to the ED. Spontaneously converted to sinus rhythm. -- Developed recurrent atrial fibrillation with RVR last evening, initially placed on IV diltiazem but developed hypotension.  Was transition to IV amiodarone -- Has since converted to sinus rhythm earlier this morning around 830 and maintaining -- On Eliquis  2.5 mg twice daily -- Continue IV amiodarone through today with plans to transition to oral dosing tomorrow.  Given her underlying COPD, ideally would transition off of amiodarone likely as an outpatient  Acute on Chronic hypoxemic respiratory failure Pneumonia -- Multifactorial in the setting of COPD on home O2, pneumonia and diastolic heart failure -- BNP 510, chest x-ray with small right pleural effusion -- s/p lasix  20mg , now on lasix  40mg  PO daily. Net - 4.5L -- Treated with Rocephin  and Zithromax , transition to Cefuroxmine Zithromax   Sepsis secondary to pneumonia -- Initial lactic acid 2.7  Mitral stenosis -- echo 10/2023 with degenerative mitral valve, severe stenosis with gradient of , moderate to severe mitral annular calcification   Hypertension -- stable -- continue metoprolol  25mg  BID   Hyperlipidemia -- Continue atorvastatin   CKD stage IIIb -- baseline 1.5-1.7, stable at 1.6  Risk Assessment/Risk Scores:  CHA2DS2-VASc Score = 7   This indicates a 11.2% annual risk of stroke. The patient's score is based upon: CHF History: 1 HTN History: 1 Diabetes History: 0 Stroke History:  2 Vascular Disease History: 0 Age Score: 2 Gender Score: 1    For questions or updates, please contact [facility] Please consult [email or web address] for contact info under   Signed, Manuelita Rummer, NP  12/25/2023 11:53 AM  Courtney Cline was seen by me today along with Manuelita Rummer, NP. I have personally performed an evaluation on this patient.  My findings are as follows: 89 y.o. female with history of HTN, HLD, HFpEF, persistent atrial fib, aortic stenosis, PAD, COPD on home O2, CVA and mitral stenosis who was admitted with pneumonia. During the hospitalization she has had atrial fibrillation noted on EMS transport with conversion to sinus rhythm in the ED and then recurrence on 12/24/23. IV Cardizem dropped her BP. She has since been started on IV amiodarone and converted to sinus around 8am today.   Data: EKG(s) and pertinent labs, studies, etc were personally reviewed and interpreted by me:  EKG not performed today Tele: reviewed by me. Sinus Labs reviewed by me Otherwise, I agree with data as outlined by the advanced practice provider.  Exam performed by me: Gen: NAD, frail elderly female Neck: no JVD Cardiac: RRR, diastolic murmur Lungs: scattered rhonci and wheezes bilaterally Extremities: no LE edema  My Assessment and Plan:  Atrial fibrillation, paroxysmal: She is known to have PAF. She converted to atrial fib last night in the setting of sepsis/pneumonia. She is converted back to sinus on IV amiodarone. Would continue IV amiodarone today then convert to po amiodarone tomorrow. Continue Eliquis .   Signed,  Lonni Cash, MD  12/25/2023 1:40 PM

## 2023-12-26 ENCOUNTER — Inpatient Hospital Stay: Admitting: Neurology

## 2023-12-26 DIAGNOSIS — J189 Pneumonia, unspecified organism: Secondary | ICD-10-CM | POA: Diagnosis not present

## 2023-12-26 DIAGNOSIS — I480 Paroxysmal atrial fibrillation: Secondary | ICD-10-CM | POA: Diagnosis not present

## 2023-12-26 DIAGNOSIS — N1832 Chronic kidney disease, stage 3b: Secondary | ICD-10-CM | POA: Diagnosis not present

## 2023-12-26 DIAGNOSIS — I4891 Unspecified atrial fibrillation: Secondary | ICD-10-CM | POA: Diagnosis not present

## 2023-12-26 DIAGNOSIS — J441 Chronic obstructive pulmonary disease with (acute) exacerbation: Secondary | ICD-10-CM

## 2023-12-26 LAB — CULTURE, BLOOD (ROUTINE X 2)
Culture: NO GROWTH
Culture: NO GROWTH
Special Requests: ADEQUATE

## 2023-12-26 MED ORDER — AMIODARONE HCL 200 MG PO TABS
200.0000 mg | ORAL_TABLET | Freq: Two times a day (BID) | ORAL | Status: DC
  Administered 2023-12-26 – 2024-01-01 (×12): 200 mg via ORAL
  Filled 2023-12-26 (×13): qty 1

## 2023-12-26 NOTE — TOC Progression Note (Addendum)
 Transition of Care [facility]) - Progression Note    Patient Details  Name: Courtney Cline MRN: [id number] Date of Birth: 03/18/1933  [facility] CM/SW Contact  Landry DELENA Senters, RN Phone Number: 12/26/2023, 2:33 PM  Clinical Narrative:     Patient has been recommended to go to SNF per therapy. After CM discussion with patient, patient states she wants to go home instead, because her husband went home yesterday with hospice care. CM spoke with patient's sons, who want patient to go to SNF for additional therapy. Sons report they will speak with patient and call CM back with final decision. CM updated SW about potential SNF needs.  1600- CM spoke with patient's son, decision was made between son and patient to go home with [facility] services. CM sent referral to [facility] per son request. Info on AVS.   CM will continue to follow.  Expected Discharge Plan: Skilled Nursing Facility Barriers to Discharge: Continued Medical Work up               Expected Discharge Plan and Services       Living arrangements for the past 2 months: Single Family Home                                       Social Drivers of Health (SDOH) Interventions SDOH Screenings   Food Insecurity: No Food Insecurity (12/21/2023)  Housing: Low Risk  (12/21/2023)  Transportation Needs: No Transportation Needs (12/21/2023)  Utilities: Not At Risk (12/21/2023)  Social Connections: Socially Isolated (12/21/2023)  Tobacco Use: Medium Risk (12/21/2023)    Readmission Risk Interventions    02/15/2023    1:37 PM  Readmission Risk Prevention Plan  Transportation Screening Complete  PCP or Specialist Appt within 5-7 Days Complete  Home Care Screening Complete  Medication Review (RN CM) Complete

## 2023-12-26 NOTE — [place] FL2 (Signed)
 [place]  MEDICAID FL2 LEVEL OF CARE FORM     IDENTIFICATION  Patient Name: Courtney Cline Birthdate: 09/28/1933 Sex: female Admission Date (Current Location): 12/21/2023  [facility] and [place] Number:  Producer, Television/film/video and Address:  [facility] [place], [address] [place], [name] [address]      Provider Number: [id number]  Attending Physician Name and Address:  Drusilla Sabas RAMAN, MD  Relative Name and Phone Number:       Current Level of Care: Hospital Recommended Level of Care: Skilled Nursing Facility Prior Approval Number:    Date Approved/Denied:   PASRR Number: [id number]  Discharge Plan: SNF    Current Diagnoses: Patient Active Problem List   Diagnosis Date Noted   History of CVA (cerebrovascular accident) 11/08/2023   COPD with acute exacerbation (HCC) 02/15/2023   CKD stage 3b, GFR 30-44 ml/min (HCC) 02/14/2023   Obesity (BMI 30-39.9) 02/14/2023   Normocytic anemia 02/14/2023   Myocardial injury 02/14/2023   Acute on chronic respiratory failure with hypoxia (HCC) 01/02/2022   Elevated troponin 01/02/2022   Sepsis (HCC) 01/02/2022   Aortic stenosis 05/06/2021   Malnutrition of moderate degree 02/16/2021   CAP (community acquired pneumonia) 01/21/2020   Persistent atrial fibrillation (HCC) 08/20/2019   COPD without exacerbation (HCC) 10/15/2018   Healthcare maintenance 10/15/2018   Physical deconditioning 10/15/2018   Sinusitis 01/29/2018   Diastolic CHF (HCC) 07/17/2017   Vitamin D  deficiency    Allergic rhinitis 11/23/2015   Osteopenia with high risk of fracture 11/05/2015   Post-nasal drip 03/16/2015   Anemia of chronic disease 03/10/2015   Subclavian artery stenosis, left 04/24/2014   Bilateral lower extremity edema 04/24/2014   Essential hypertension 04/24/2014   Hyperlipidemia 04/24/2014   Breast cancer of upper-inner quadrant of right female breast (HCC) 11/19/2013   Chronic hypoxemic respiratory failure (HCC)  10/02/2013    Orientation RESPIRATION BLADDER Height & Weight     Time, Self, Situation, Place  O2 (Hugo 4L) Continent Weight: 150 lb (68 kg) Height:  5' (152.4 cm)  BEHAVIORAL SYMPTOMS/MOOD NEUROLOGICAL BOWEL NUTRITION STATUS      Continent Diet (see DC summary)  AMBULATORY STATUS COMMUNICATION OF NEEDS Skin   Extensive Assist Verbally Normal                       Personal Care Assistance Level of Assistance  Bathing, Feeding, Dressing Bathing Assistance: Maximum assistance Feeding assistance: Independent Dressing Assistance: Maximum assistance     Functional Limitations Info  Sight Sight Info: Impaired (wears glasses)        SPECIAL CARE FACTORS FREQUENCY  PT (By licensed PT), OT (By licensed OT)     PT Frequency: 5x/wk OT Frequency: 5x/wk            Contractures Contractures Info: Not present    Additional Factors Info  Code Status, Allergies, Psychotropic Code Status Info: DNR Allergies Info: NKA Psychotropic Info: Remeron  7.5mg  daily at bed         Current Medications (12/26/2023):  This is the current hospital active medication list Current Facility-Administered Medications  Medication Dose Route Frequency Provider Last Rate Last Admin   acetaminophen  (TYLENOL ) tablet 650 mg  650 mg Oral Q6H PRN Seena Marsa NOVAK, MD       Or   acetaminophen  (TYLENOL ) suppository 650 mg  650 mg Rectal Q6H PRN Seena Marsa NOVAK, MD       albuterol  (PROVENTIL ) (2.5 MG/3ML) 0.083% nebulizer solution 2.5 mg  2.5 mg Inhalation Q4H PRN Melvin, Alexander B, MD   2.5 mg at 12/22/23 9180   amiodarone (PACERONE) tablet 200 mg  200 mg Oral BID Verlin Lonni BIRCH, MD   200 mg at 12/26/23 1238   apixaban  (ELIQUIS ) tablet 2.5 mg  2.5 mg Oral BID Melvin, Alexander B, MD   2.5 mg at 12/26/23 1032   atorvastatin  (LIPITOR ) tablet 80 mg  80 mg Oral Daily Melvin, Alexander B, MD   80 mg at 12/26/23 1032   budesonide -glycopyrrolate -formoterol  (BREZTRI ) 160-9-4.8 MCG/ACT inhaler  2 puff  2 puff Inhalation BID Seena Marsa NOVAK, MD   2 puff at 12/26/23 0851   diltiazem (CARDIZEM) 125 mg in dextrose  5% 125 mL (1 mg/mL) infusion  5-15 mg/hr Intravenous Titrated Drusilla Sabas RAMAN, MD   Stopped at 12/23/23 1936   furosemide  (LASIX ) tablet 40 mg  40 mg Oral Daily Lama, Gagan S, MD   40 mg at 12/26/23 1032   guaiFENesin  (MUCINEX ) 12 hr tablet 600 mg  600 mg Oral BID Mansy, Jan A, MD   600 mg at 12/26/23 1032   megestrol (MEGACE) 400 MG/10ML suspension 200 mg  200 mg Oral BID Lama, Gagan S, MD   200 mg at 12/26/23 1037   melatonin tablet 3 mg  3 mg Oral QHS PRN Mansy, Jan A, MD   3 mg at 12/25/23 0031   metoprolol  tartrate (LOPRESSOR ) tablet 25 mg  25 mg Oral BID Melvin, Alexander B, MD   25 mg at 12/26/23 1032   mirtazapine  (REMERON ) tablet 7.5 mg  7.5 mg Oral QHS Melvin, Alexander B, MD   7.5 mg at 12/25/23 2114   pantoprazole  (PROTONIX ) EC tablet 40 mg  40 mg Oral BID AC Melvin, Alexander B, MD   40 mg at 12/26/23 0817   polyethylene glycol (MIRALAX  / GLYCOLAX ) packet 17 g  17 g Oral Daily PRN Seena Marsa NOVAK, MD       sodium chloride  flush (NS) 0.9 % injection 3 mL  3 mL Intravenous Q12H Seena Marsa NOVAK, MD   3 mL at 12/26/23 1033     Discharge Medications: Please see discharge summary for a list of discharge medications.  Relevant Imaging Results:  Relevant Lab Results:   Additional Information SS#: [id number]  [name], LCSW

## 2023-12-26 NOTE — Progress Notes (Signed)
 Rounding Note   Patient Name: Courtney Cline Date of Encounter: 12/26/2023  [facility] Cardiologist: Dorn Lesches, MD   Subjective  C/o some dyspnea.  Scheduled Meds:  apixaban   2.5 mg Oral BID   atorvastatin   80 mg Oral Daily   azithromycin   500 mg Oral Daily   budesonide -glycopyrrolate -formoterol   2 puff Inhalation BID   cefUROXime  250 mg Oral BID WC   furosemide   40 mg Oral Daily   guaiFENesin   600 mg Oral BID   megestrol  200 mg Oral BID   metoprolol  tartrate  25 mg Oral BID   mirtazapine   7.5 mg Oral QHS   pantoprazole   40 mg Oral BID AC   sodium chloride  flush  3 mL Intravenous Q12H   Continuous Infusions:  amiodarone 30 mg/hr (12/26/23 0510)   diltiazem (CARDIZEM) infusion Stopped (12/23/23 1936)   PRN Meds: acetaminophen  **OR** acetaminophen , albuterol , melatonin, polyethylene glycol   Vital Signs  Vitals:   12/26/23 0040 12/26/23 0504 12/26/23 0737 12/26/23 0851  BP: (!) 124/42 104/79 (!) 137/49   Pulse: 77 65 79   Resp: 17 18 20    Temp: 97.9 F (36.6 C) 98.1 F (36.7 C) 98.4 F (36.9 C)   TempSrc: Oral Oral Oral   SpO2:   100% 99%  Weight:      Height:        Intake/Output Summary (Last 24 hours) at 12/26/2023 1007 Last data filed at 12/26/2023 0500 Gross per 24 hour  Intake 200.4 ml  Output 500 ml  Net -299.6 ml      12/21/2023   11:47 AM 11/23/2023    5:00 AM 11/22/2023    6:07 AM  Last 3 Weights  Weight (lbs) 150 lb 150 lb 12.7 oz 151 lb 1.6 oz  Weight (kg) 68.04 kg 68.4 kg 68.539 kg      Telemetry sinus - Personally Reviewed  ECG  No AM EKG - Personally Reviewed  Physical Exam  GEN: No acute distress.   Neck: No JVD Cardiac: RRR, no murmurs, rubs, or gallops.  Respiratory: Clear to auscultation bilaterally. GI: Soft, nontender, non-distended  MS: No edema; No deformity. Neuro:  Nonfocal  Psych: Normal affect   Labs High Sensitivity Troponin:  No results for input(s): TROPONINIHS in the last 720  hours.   Chemistry Recent Labs  Lab 12/21/23 1139 12/22/23 0131 12/24/23 0121 12/25/23 0242  NA 142 141 145 143  K 4.6 4.3 3.9 3.4*  CL 105 105 103 104  CO2 24 25 29 30   GLUCOSE 125* 107* 160* 100*  BUN 18 17 23 21   CREATININE 1.68* 1.46* 1.64* 1.62*  CALCIUM  8.9 8.4* 8.5* 8.4*  MG 2.1  --   --  2.0  PROT 7.0 5.4*  --   --   ALBUMIN 2.7* 2.1*  --   --   AST 26 16  --   --   ALT 17 12  --   --   ALKPHOS 50 39  --   --   BILITOT 0.8 0.5  --   --   GFRNONAA 29* 34* 30* 30*  ANIONGAP 13 11 13 9     Lipids No results for input(s): CHOL, TRIG, HDL, LABVLDL, LDLCALC, CHOLHDL in the last 168 hours.  Hematology Recent Labs  Lab 12/21/23 1139 12/22/23 0517 12/25/23 0242  WBC 14.8* 10.6* 7.5  RBC 2.88* 2.47* 2.56*  HGB 8.6* 7.3* 7.4*  HCT 28.3* 24.1* 24.8*  MCV 98.3 97.6 96.9  MCH 29.9  29.6 28.9  MCHC 30.4 30.3 29.8*  RDW 15.8* 15.6* 15.2  PLT 393 306 363   Thyroid  No results for input(s): TSH, FREET4 in the last 168 hours.  BNP Recent Labs  Lab 12/22/23 0517  BNP 489.6*    DDimer No results for input(s): DDIMER in the last 168 hours.   Radiology  No results found.  Patient Profile   89 y.o. female with history of HTN, HLD, HFpEF, persistent atrial fib on Eliquis , aortic stenosis, PAD, COPD on home O2, CVA and mitral stenosis who was admitted with pneumonia. During the hospitalization she has had atrial fibrillation noted on EMS transport with conversion to sinus rhythm in the ED and then recurrence atrial fib with RVR on 12/24/23. IV Cardizem dropped her BP. She was started on IV amiodarone and converted to sinus.   Assessment & Plan   Atrial fibrillation, paroxysmal: She is known to have PAF. She converted to atrial fib on 12/24/23 in the setting of sepsis/pneumonia. She converted back to sinus on IV amiodarone on 12/25/23. She remains in sinus rhythm today.  -Stop IV amiodarone today and change to po amiodarone.  -Continue Eliquis    Cardiology  will sign off. Please call with questions.    For questions or updates, please contact [facility] Please consult [email or web address] for contact info under   Signed, Lonni Cash, MD  12/26/2023, 10:07 AM

## 2023-12-26 NOTE — Progress Notes (Addendum)
 [facility]  PROGRESS NOTE  Courtney Cline FMW:[id number] DOB: 1933-05-14 DOA: 12/21/2023 PCP: Elliot Charm, MD   Brief HPI:   89 y.o. female with medical history significant of hypertension, hyperlipidemia, atrial fibrillation, CAD, CKD 3B, CHF, breast cancer, obesity, anemia, COPD, chronic respiratory failure on 3 L presenting with worsening shortness of breath.   Patient has had 2 days of worsening shortness of breath.  Also has associated cough.  Developed significant tachycardia today.  EMS was called.   On EMS arrival patient noted to have A-fib with RVR with rates in the 200s.  By time patient arrived to the ED heart rate improved to the 140s and while in the ED spontaneously converted to sinus rhythm in the 120s. Chest x-ray showed small right pleural effusion with right lower lobe atelectasis versus airspace disease. Patient received ceftriaxone , azithromycin , 1 L IV fluids, Tylenol  in the ED.  Also started on 150 cc an hour of fluids At this time patient is stable for discharge Refuses to go to skilled nursing facility Likely to go home with home health PT   Assessment/Plan:    Acute on chronic hypoxemic respiratory failure -Resolved, back to baseline -Multifactorial, due to pneumonia as well as acute diastolic CHF - Baseline oxygen  requirement 4 L/min - Patient had leukocytosis with chest x-ray showed evidence of possible airspace disease in the right lower lobe, lactic acid 2.7 - Started on Rocephin  and Zithromax ; antibiotics changed to cefuroxime and Zithromax  - Also diuresed very well with IV Lasix  20 mg every 12 hours - BNP elevated to 489, procalcitonin 0.94 -IV Lasix  changed to Lasix  40 mg daily - Can be discharged on Lasix  40 mg p.o. daily - Check BMP in am   Sepsis due to pneumonia -Sepsis physiology has resolved - Lactic acid was 2.7, chest x-ray showed worsening infiltrate - Completed 6 days of antibiotics in the hospital, will  discontinue cefuroxime and will Chromax    Atrial fibrillation with RVR - presented with A-fib with RVR.  Rate reportedly in 200s for EMS, improved to the 140s in the ED.   - spontaneously converted while being treated in the ED. Avi into A-fib with RVR, initially started on Cardizem gtt. however patient blood pressure dropped and she was started on amiodarone gtt. -Cardiology consulted, amiodarone gtt. has been switched to p.o. amiodarone 200 mg p.o. twice daily -Will follow-up with cardiology as outpatient    Hypertension - Continue with metoprolol    Hyperlipidemia - Continue home atorvastatin    History of CVA - Continue home atorvastatin , Eliquis    CKD 3B - Creatinine stable at 1.68 in the ED. - Trend renal function and electrolytes   Acute on chronic diastolic CHF -Significantly improved with IV Lasix  - Echocardiogram obtained in September this year showed EF of 60-65 percent with grade 2 diastolic dysfunction -Diuresed well with IV Lasix   - BNP elevated 489 - IV Lasix  has been switched to Lasix  40 mg p.o. daily   History of breast cancer Obesity - Noted   Anemia - Hemoglobin stable at 7.3 - Trend CBC   COPD - Place home Trelegy with formulary Breztri  - Continue home albuterol       DVT prophylaxis: Apixaban   Medications     amiodarone  200 mg Oral BID   apixaban   2.5 mg Oral BID   atorvastatin   80 mg Oral Daily   budesonide -glycopyrrolate -formoterol   2 puff Inhalation BID   furosemide   40 mg Oral Daily   guaiFENesin   600 mg Oral BID  megestrol  200 mg Oral BID   metoprolol  tartrate  25 mg Oral BID   mirtazapine   7.5 mg Oral QHS   pantoprazole   40 mg Oral BID AC   sodium chloride  flush  3 mL Intravenous Q12H     Data Reviewed:   CBG:  No results for input(s): GLUCAP in the last 168 hours.  SpO2: 100 % O2 Flow Rate (L/min): 4 L/min FiO2 (%): 100 %    Vitals:   12/26/23 0737 12/26/23 0851 12/26/23 1130 12/26/23 1504  BP: (!) 137/49   118/67 (!) 120/44  Pulse: 79  91 75  Resp: 20  18 20   Temp: 98.4 F (36.9 C)  98.4 F (36.9 C) 98 F (36.7 C)  TempSrc: Oral  Oral Oral  SpO2: 100% 99% 93% 100%  Weight:      Height:          Data Reviewed:  Basic Metabolic Panel: Recent Labs  Lab 12/21/23 1139 12/22/23 0131 12/24/23 0121 12/25/23 0242  NA 142 141 145 143  K 4.6 4.3 3.9 3.4*  CL 105 105 103 104  CO2 24 25 29 30   GLUCOSE 125* 107* 160* 100*  BUN 18 17 23 21   CREATININE 1.68* 1.46* 1.64* 1.62*  CALCIUM  8.9 8.4* 8.5* 8.4*  MG 2.1  --   --  2.0    CBC: Recent Labs  Lab 12/21/23 1139 12/22/23 0517 12/25/23 0242  WBC 14.8* 10.6* 7.5  NEUTROABS 12.2*  --   --   HGB 8.6* 7.3* 7.4*  HCT 28.3* 24.1* 24.8*  MCV 98.3 97.6 96.9  PLT 393 306 363    LFT Recent Labs  Lab 12/21/23 1139 12/22/23 0131  AST 26 16  ALT 17 12  ALKPHOS 50 39  BILITOT 0.8 0.5  PROT 7.0 5.4*  ALBUMIN 2.7* 2.1*     Antibiotics: Anti-infectives (From admission, onward)    Start     Dose/Rate Route Frequency Ordered Stop   12/25/23 1000  azithromycin  (ZITHROMAX ) tablet 500 mg  Status:  Discontinued        500 mg Oral Daily 12/24/23 1157 12/26/23 1007   12/25/23 0800  cefUROXime (CEFTIN) tablet 500 mg  Status:  Discontinued        500 mg Oral 2 times daily with meals 12/24/23 1158 12/24/23 1540   12/25/23 0800  cefUROXime (CEFTIN) tablet 250 mg  Status:  Discontinued        250 mg Oral 2 times daily with meals 12/24/23 1540 12/26/23 1007   12/22/23 1000  cefTRIAXone  (ROCEPHIN ) 2 g in sodium chloride  0.9 % 100 mL IVPB  Status:  Discontinued        2 g 200 mL/hr over 30 Minutes Intravenous Every 24 hours 12/21/23 1410 12/24/23 1157   12/22/23 1000  azithromycin  (ZITHROMAX ) 500 mg in sodium chloride  0.9 % 250 mL IVPB  Status:  Discontinued        500 mg 250 mL/hr over 60 Minutes Intravenous Every 24 hours 12/21/23 1410 12/24/23 1157   12/21/23 1145  cefTRIAXone  (ROCEPHIN ) 2 g in sodium chloride  0.9 % 100 mL IVPB         2 g 200 mL/hr over 30 Minutes Intravenous Once 12/21/23 1141 12/21/23 1234   12/21/23 1145  azithromycin  (ZITHROMAX ) 500 mg in sodium chloride  0.9 % 250 mL IVPB        500 mg 250 mL/hr over 60 Minutes Intravenous  Once 12/21/23 1141 12/21/23 1313  CONSULTS none  Code Status: DNR  Family Communication: Discussed with patient's son at bedside     Subjective   Feels much better today   Objective    Physical Examination:  Appears in no acute distress S1-S2, regular, no murmur auscultated Lungs clear to auscultation bilaterally Abdomen is soft, nontender Extremities no edema   Status is: Inpatient:             [name]   [facility] If 7PM-7AM, please contact night-coverage at [email or web address], Office  [phone]   12/26/2023, 3:36 PM  LOS: 5 days

## 2023-12-27 ENCOUNTER — Inpatient Hospital Stay

## 2023-12-27 DIAGNOSIS — R6520 Severe sepsis without septic shock: Secondary | ICD-10-CM | POA: Diagnosis not present

## 2023-12-27 DIAGNOSIS — A419 Sepsis, unspecified organism: Secondary | ICD-10-CM | POA: Diagnosis not present

## 2023-12-27 DIAGNOSIS — J9601 Acute respiratory failure with hypoxia: Secondary | ICD-10-CM | POA: Diagnosis not present

## 2023-12-27 LAB — BASIC METABOLIC PANEL WITH GFR
Anion gap: 13 (ref 5–15)
BUN: 25 mg/dL — ABNORMAL HIGH (ref 8–23)
CO2: 27 mmol/L (ref 22–32)
Calcium: 8.3 mg/dL — ABNORMAL LOW (ref 8.9–10.3)
Chloride: 104 mmol/L (ref 98–111)
Creatinine, Ser: 1.97 mg/dL — ABNORMAL HIGH (ref 0.44–1.00)
GFR, Estimated: 24 mL/min — ABNORMAL LOW (ref >60)
Glucose, Bld: 198 mg/dL — ABNORMAL HIGH (ref 70–99)
Potassium: 4 mmol/L (ref 3.5–5.1)
Sodium: 144 mmol/L (ref 135–145)

## 2023-12-27 MED ORDER — METOPROLOL TARTRATE 25 MG PO TABS
25.0000 mg | ORAL_TABLET | Freq: Two times a day (BID) | ORAL | Status: AC
  Administered 2023-12-27: 25 mg via ORAL
  Filled 2023-12-27: qty 1

## 2023-12-27 MED ORDER — METOPROLOL SUCCINATE ER 50 MG PO TB24
50.0000 mg | ORAL_TABLET | Freq: Every day | ORAL | Status: DC
  Administered 2023-12-28 – 2024-01-01 (×5): 50 mg via ORAL
  Filled 2023-12-27 (×5): qty 1

## 2023-12-27 NOTE — TOC Progression Note (Signed)
 Transition of Care [facility]) - Progression Note    Patient Details  Name: Courtney Cline MRN: [id number] Date of Birth: 24-Feb-1933  [facility] CM/SW Contact  [name], [name] Phone Number: 12/27/2023, 11:27 AM  Clinical Narrative:   CSW updated by PT that patient is now agreeable to SNF. CSW met with patient and son at bedside to provide bed offers for SNF and answer questions. Patient and son in agreement with [facility]. CSW confirmed bed availability with [facility]. CSW contacted CMA to request insurance authorization. CSW to follow.    Expected Discharge Plan: Skilled Nursing Facility Barriers to Discharge: Continued Medical Work up, English As A Second Language Teacher               Expected Discharge Plan and Services       Living arrangements for the past 2 months: Single Family Home                                       Social Drivers of Health (SDOH) Interventions SDOH Screenings   Food Insecurity: No Food Insecurity (12/21/2023)  Housing: Low Risk  (12/21/2023)  Transportation Needs: No Transportation Needs (12/21/2023)  Utilities: Not At Risk (12/21/2023)  Social Connections: Socially Isolated (12/21/2023)  Tobacco Use: Medium Risk (12/21/2023)    Readmission Risk Interventions    02/15/2023    1:37 PM  Readmission Risk Prevention Plan  Transportation Screening Complete  PCP or Specialist Appt within 5-7 Days Complete  Home Care Screening Complete  Medication Review (RN CM) Complete

## 2023-12-27 NOTE — Hospital Course (Signed)
 89 y.o. female with medical history significant of hypertension, hyperlipidemia, atrial fibrillation, CAD, CKD 3B, CHF, breast cancer, obesity, anemia, COPD, chronic respiratory failure on 3 L presented hospital with worsening shortness of breath for 2 days with cough.  EMS was called in and patient was noted to have A-fib with RVR with heart rate up to 200s.  Chest x-ray showed small pleural effusion with right lower lobe atelectasis versus airspace disease.  Initially patient and spontaneously converted but subsequently had A-fib with RVR and required cardiology evaluation with subsequent Cardizem followed by amiodarone drip and now on oral amiodarone.  At this time patient has completed course of antibiotics.  PT recommended skilled facility but has refused.   Acute on chronic hypoxemic respiratory failure Improved at this time.  Back to baseline.  Respiratory failure thought to be secondary to pneumonia as well as diastolic heart failure.  Patient is on full dose of oxygen  at baseline.  Leukocytosis has resolved.  Completed course of antibiotic.  On oral Lasix  at this time.  Mild hypokalemia.  Was replenished.  Latest potassium of 4 point   Sepsis due to pneumonia Sepsis physiology has resolved.  Chest x-ray showed infiltrate.  Has completed 6-day course of antibiotic.  Currently on 4 L of oxygen  by nasal cannula  Atrial fibrillation with RVR Initially on Cardizem drip but due to low blood pressure was changed to amiodarone drip after cardiology was consulted.  This has been changed to amiodarone 200 mg twice a day at this time.  Plan is to follow-up with cardiology as outpatient.  Rate controlled at this time.    Essential hypertension Continue metoprolol    Hyperlipidemia Continue Lipitor    History of CVA. Continue Eliquis  and Lipitor .   CKD 3B Latest creatinine of 1.9 from 1.6.  Will continue to monitor)   Acute on chronic diastolic CHF 2D echocardiogram from September 2025 showed LV  ejection fraction of 60 to 65% with grade 2 diastolic dysfunction.  Patient has responded well with IV Lasix .  BNP elevated at 489.  IV Lasix  has been switched to 40 mg orally daily.  Patient is overall negative balance for 5364 mL.    History of breast cancer. No acute issues at this time  Anemia Hemoglobin has been stable at 7.4.   COPD Continue bronchodilators from home.  Debility deconditioning.  Patient has refused skilled nursing facility placement, plan is home with home health

## 2023-12-27 NOTE — Progress Notes (Signed)
 PROGRESS NOTE  Courtney Cline FMW:[id number] DOB: 1933-09-26 DOA: 12/21/2023 PCP: Elliot Charm, MD   LOS: 6 days   Brief narrative:  89 y.o. female with medical history significant of hypertension, hyperlipidemia, atrial fibrillation, CAD, CKD 3B, CHF, breast cancer, obesity, anemia, COPD, chronic respiratory failure on 3 L presented hospital with worsening shortness of breath for 2 days with cough.  EMS was called in and patient was noted to have A-fib with RVR with heart rate up to 200s.  Chest x-ray showed small pleural effusion with right lower lobe atelectasis versus airspace disease.  Initially patient and spontaneously converted but subsequently had A-fib with RVR and required cardiology evaluation with subsequent Cardizem followed by amiodarone drip and now on oral amiodarone.  At this time patient has completed course of antibiotics.  PT recommended skilled nursing facility which family initially refused and now wish to reconsider.    Assessment/Plan: Principal Problem:   Sepsis (HCC) Active Problems:   Breast cancer of upper-inner quadrant of right female breast (HCC)   Essential hypertension   Hyperlipidemia   Diastolic CHF (HCC)   Persistent atrial fibrillation (HCC)   CAP (community acquired pneumonia)   Acute on chronic respiratory failure with hypoxia (HCC)   CKD stage 3b, GFR 30-44 ml/min (HCC)   Obesity (BMI 30-39.9)   Normocytic anemia   COPD with acute exacerbation (HCC)   History of CVA (cerebrovascular accident)   Acute on chronic hypoxemic respiratory failure Improved at this time.  Back to baseline.  Respiratory failure thought to be secondary to pneumonia as well as diastolic heart failure.  Patient is on 4 L of oxygen  at baseline.  Leukocytosis has resolved.  Completed course of antibiotic.  On oral Lasix  at this time.  Mild hypokalemia.  Was replenished.  Latest potassium of 4.0   Sepsis due to pneumonia Sepsis physiology has resolved.   Chest x-ray showed infiltrate.  Has completed 6-day course of antibiotic.  Currently on 4 L of oxygen  by nasal cannula  Atrial fibrillation with RVR Initially on Cardizem drip but due to low blood pressure was changed to amiodarone drip after cardiology was consulted.  This has been changed to amiodarone 200 mg twice a day at this time.  Continue metoprolol  as well.  Plan is to follow-up with cardiology as outpatient.  Rate is intermittently uncontrolled at this time.    Essential hypertension Continue metoprolol    Hyperlipidemia Continue Lipitor    History of CVA. Continue Eliquis  and Lipitor .   CKD 3B Latest creatinine of 1.9 from 1.6.  Will continue to monitor BMP.  Check levels in AM.   Acute on chronic diastolic CHF 2D echocardiogram from September 2025 showed LV ejection fraction of 60 to 65% with grade 2 diastolic dysfunction.  Patient has responded well with IV Lasix .  BNP elevated at 489.  IV Lasix  has been switched to 40 mg orally daily.  Patient is overall negative balance for 5364 mL.    History of breast cancer. No acute issues at this time  Anemia Hemoglobin has been stable at 7.4.   COPD Continue bronchodilators from home.  Debility deconditioning.  Patient's son had initially refused skilled nursing facility placement, but now reconsidering skilled nursing facility.  DVT prophylaxis: apixaban  (ELIQUIS ) tablet 2.5 mg Start: 12/21/23 2200 apixaban  (ELIQUIS ) tablet 2.5 mg   Disposition: Skilled nursing facility  Status is: Inpatient Remains inpatient appropriate because: Need for skilled nursing facility placement    Code Status:     Code Status: Limited: Do  not attempt resuscitation (DNR) -DNR-LIMITED -Do Not Intubate/DNI   Family Communication: Spoke with the patient's son at bedside  Consultants: Cardiology  Procedures: None  Anti-infectives:  Completed Zithromax  and Ceftin  Anti-infectives (From admission, onward)    Start     Dose/Rate Route  Frequency Ordered Stop   12/25/23 1000  azithromycin  (ZITHROMAX ) tablet 500 mg  Status:  Discontinued        500 mg Oral Daily 12/24/23 1157 12/26/23 1007   12/25/23 0800  cefUROXime (CEFTIN) tablet 500 mg  Status:  Discontinued        500 mg Oral 2 times daily with meals 12/24/23 1158 12/24/23 1540   12/25/23 0800  cefUROXime (CEFTIN) tablet 250 mg  Status:  Discontinued        250 mg Oral 2 times daily with meals 12/24/23 1540 12/26/23 1007   12/22/23 1000  cefTRIAXone  (ROCEPHIN ) 2 g in sodium chloride  0.9 % 100 mL IVPB  Status:  Discontinued        2 g 200 mL/hr over 30 Minutes Intravenous Every 24 hours 12/21/23 1410 12/24/23 1157   12/22/23 1000  azithromycin  (ZITHROMAX ) 500 mg in sodium chloride  0.9 % 250 mL IVPB  Status:  Discontinued        500 mg 250 mL/hr over 60 Minutes Intravenous Every 24 hours 12/21/23 1410 12/24/23 1157   12/21/23 1145  cefTRIAXone  (ROCEPHIN ) 2 g in sodium chloride  0.9 % 100 mL IVPB        2 g 200 mL/hr over 30 Minutes Intravenous Once 12/21/23 1141 12/21/23 1234   12/21/23 1145  azithromycin  (ZITHROMAX ) 500 mg in sodium chloride  0.9 % 250 mL IVPB        500 mg 250 mL/hr over 60 Minutes Intravenous  Once 12/21/23 1141 12/21/23 1313        Subjective: Today, patient was seen and examined at bedside.  Patient complains of mild cough with phlegm but no overt dyspnea or chest pain.  Denies any nausea, vomiting or abdominal pain.  Patient's son at bedside stated that she did have some increased heart rate when she exerted today.  Objective: Vitals:   12/26/23 2331 12/27/23 0733  BP: (!) 119/36 124/63  Pulse: 71 (!) 44  Resp: 19 18  Temp: 98 F (36.7 C) 98.9 F (37.2 C)  SpO2:  99%    Intake/Output Summary (Last 24 hours) at 12/27/2023 1059 Last data filed at 12/26/2023 2100 Gross per 24 hour  Intake --  Output 550 ml  Net -550 ml   Filed Weights   12/21/23 1147  Weight: 68 kg   Body mass index is 29.29 kg/m.   Physical Exam:  GENERAL:  Patient is alert awake and oriented. Not in obvious distress.  On 4 L of oxygen , elderly female deconditioned  HENT: No scleral pallor or icterus. Pupils equally reactive to light. Oral mucosa is moist NECK: is supple, no gross swelling noted. CHEST: Diminished breath sounds bilaterally. CVS: S1 and S2 heard, no murmur.  Irregular rhythm. ABDOMEN: Soft, non-tender, bowel sounds are present. EXTREMITIES: No edema. CNS: Cranial nerves are intact.  Generalized weakness noted. SKIN: warm and dry without rashes.  Data Review: I have personally reviewed the following laboratory data and studies,  CBC: Recent Labs  Lab 12/21/23 1139 12/22/23 0517 12/25/23 0242  WBC 14.8* 10.6* 7.5  NEUTROABS 12.2*  --   --   HGB 8.6* 7.3* 7.4*  HCT 28.3* 24.1* 24.8*  MCV 98.3 97.6 96.9  PLT 393 306  363   Basic Metabolic Panel: Recent Labs  Lab 12/21/23 1139 12/22/23 0131 12/24/23 0121 12/25/23 0242 12/27/23 0140  NA 142 141 145 143 144  K 4.6 4.3 3.9 3.4* 4.0  CL 105 105 103 104 104  CO2 24 25 29 30 27   GLUCOSE 125* 107* 160* 100* 198*  BUN 18 17 23 21  25*  CREATININE 1.68* 1.46* 1.64* 1.62* 1.97*  CALCIUM  8.9 8.4* 8.5* 8.4* 8.3*  MG 2.1  --   --  2.0  --    Liver Function Tests: Recent Labs  Lab 12/21/23 1139 12/22/23 0131  AST 26 16  ALT 17 12  ALKPHOS 50 39  BILITOT 0.8 0.5  PROT 7.0 5.4*  ALBUMIN 2.7* 2.1*   No results for input(s): LIPASE, AMYLASE in the last 168 hours. No results for input(s): AMMONIA in the last 168 hours. Cardiac Enzymes: No results for input(s): CKTOTAL, CKMB, CKMBINDEX, TROPONINI in the last 168 hours. BNP (last 3 results) Recent Labs    02/13/23 2158 12/22/23 0517  BNP 361.3* 489.6*    ProBNP (last 3 results) No results for input(s): PROBNP in the last 8760 hours.  CBG: No results for input(s): GLUCAP in the last 168 hours. Recent Results (from the past 240 hours)  Resp panel by RT-PCR (RSV, Flu A&B, Covid) Anterior Nasal  Swab     Status: None   Collection Time: 12/21/23 11:39 AM   Specimen: Anterior Nasal Swab  Result Value Ref Range Status   SARS Coronavirus 2 by RT PCR NEGATIVE NEGATIVE Final   Influenza A by PCR NEGATIVE NEGATIVE Final   Influenza B by PCR NEGATIVE NEGATIVE Final    Comment: (NOTE) The Xpert Xpress SARS-CoV-2/FLU/RSV plus assay is intended as an aid in the diagnosis of influenza from Nasopharyngeal swab specimens and should not be used as a sole basis for treatment. Nasal washings and aspirates are unacceptable for Xpert Xpress SARS-CoV-2/FLU/RSV testing.  Fact Sheet for Patients: bloggercourse.com  Fact Sheet for Healthcare Providers: seriousbroker.it  This test is not yet approved or cleared by the [place]  FDA and has been authorized for detection and/or diagnosis of SARS-CoV-2 by FDA under an Emergency Use Authorization (EUA). This EUA will remain in effect (meaning this test can be used) for the duration of the COVID-19 declaration under Section 564(b)(1) of the Act, 21 U.S.C. section 360bbb-3(b)(1), unless the authorization is terminated or revoked.     Resp Syncytial Virus by PCR NEGATIVE NEGATIVE Final    Comment: (NOTE) Fact Sheet for Patients: bloggercourse.com  Fact Sheet for Healthcare Providers: seriousbroker.it  This test is not yet approved or cleared by the [place]  FDA and has been authorized for detection and/or diagnosis of SARS-CoV-2 by FDA under an Emergency Use Authorization (EUA). This EUA will remain in effect (meaning this test can be used) for the duration of the COVID-19 declaration under Section 564(b)(1) of the Act, 21 U.S.C. section 360bbb-3(b)(1), unless the authorization is terminated or revoked.  Performed at [facility], [address], [place], [name] [address]   Blood Culture (routine x 2)     Status: None    Collection Time: 12/21/23 11:39 AM   Specimen: BLOOD  Result Value Ref Range Status   Specimen Description BLOOD LEFT ANTECUBITAL  Final   Special Requests   Final    BOTTLES DRAWN AEROBIC AND ANAEROBIC Blood Culture adequate volume   Culture   Final    NO GROWTH 5 DAYS Performed at [facility]  Lab, [address] [place], [name] [address]    Report Status 12/26/2023 FINAL  Final  Blood Culture (routine x 2)     Status: None   Collection Time: 12/21/23 11:44 AM   Specimen: BLOOD  Result Value Ref Range Status   Specimen Description BLOOD RIGHT ANTECUBITAL  Final   Special Requests   Final    BOTTLES DRAWN AEROBIC AND ANAEROBIC Blood Culture results may not be optimal due to an inadequate volume of blood received in culture bottles   Culture   Final    NO GROWTH 5 DAYS Performed at [facility], [address] [place], [name] [address]    Report Status 12/26/2023 FINAL  Final     Studies: No results found.    Torsten Weniger, MD  [facility] 12/27/2023  If 7PM-7AM, please contact night-coverage

## 2023-12-27 NOTE — Progress Notes (Signed)
 Physical Therapy Treatment Patient Details Name: Courtney Cline MRN: [id number] DOB: [birth year] Today's Date: 12/27/2023   History of Present Illness The pt is an 89 yo female presenting 11/6 with afib with RVR. Work up concerning for sepsis, admitted for resp failure likely due to PNA. PMH includes: HTN, HLD, afib, CAD, CKD III, CHF, breast cancer, obesity, COPD on 4L O2.    PT Comments  Pt received in supine and agreeable to session with son present throughout. Pt able to complete bed mobility with CGA and standing with min A, but demonstrates posterior lean with improvement in upright posture and requires return to sitting. Pt reports mild dizziness, BP stable. Pt requests to use the [facility] and requires total A with pericare. Pt able to ambulate short distance to recliner, but is limited by shortness of breath and fatigue. HR noted to be in 150's with mobility. Pt states I can't go home like this and is now agreeable to post-acute therapy, <3 hours/day to improve functional mobility and increase independence prior to return home. Pt continues to benefit from PT services to progress toward functional mobility goals.     If plan is discharge home, recommend the following: A lot of help with walking and/or transfers;A lot of help with bathing/dressing/bathroom;Assist for transportation;Help with stairs or ramp for entrance;Assistance with cooking/housework   Can travel by private vehicle     No  Equipment Recommendations  None recommended by PT    Recommendations for Other Services       Precautions / Restrictions Precautions Precautions: Fall Recall of Precautions/Restrictions: Impaired Precaution/Restrictions Comments: monitor HR, 4L O2 at baseline Restrictions Weight Bearing Restrictions Per Provider Order: No     Mobility  Bed Mobility Overal bed mobility: Needs Assistance Bed Mobility: Supine to Sit     Supine to sit: Contact guard, HOB elevated, Used rails      General bed mobility comments: increased time and use of bedrail    Transfers Overall transfer level: Needs assistance Equipment used: Rolling walker (2 wheels) Transfers: Sit to/from Stand Sit to Stand: Min assist           General transfer comment: From EOB and BSC with light min A for power up and balance. Posterior lean with upright posture. Cues for hand placement    Ambulation/Gait Ambulation/Gait assistance: Min assist Gait Distance (Feet): 10 Feet Assistive device: Rolling walker (2 wheels) Gait Pattern/deviations: Step-to pattern, Decreased stride length, Trunk flexed, Step-through pattern Gait velocity: decreased     General Gait Details: Short steps with assist for stability and line management. Cues for RW proximity   Stairs             Wheelchair Mobility     Tilt Bed    Modified Rankin (Stroke Patients Only)       Balance Overall balance assessment: Needs assistance Sitting-balance support: Bilateral upper extremity supported, Feet supported Sitting balance-Leahy Scale: Fair Sitting balance - Comments: sitting EOB   Standing balance support: Bilateral upper extremity supported, During functional activity, Reliant on assistive device for balance Standing balance-Leahy Scale: Poor Standing balance comment: reliant on RW support. Posterior lean in static standing with cues for upright posture                            Communication Communication Communication: No apparent difficulties  Cognition Arousal: Alert Behavior During Therapy: WFL for tasks assessed/performed   PT - Cognitive impairments: No apparent impairments  Following commands: Intact      Cueing Cueing Techniques: Verbal cues, Visual cues  Exercises      General Comments General comments (skin integrity, edema, etc.): HR up to 150's with mobility      Pertinent Vitals/Pain Pain Assessment Pain Assessment: No/denies  pain     PT Goals (current goals can now be found in the care plan section) Acute Rehab PT Goals Patient Stated Goal: to gain more independence with mobility PT Goal Formulation: With patient/family Time For Goal Achievement: 01/06/24 Progress towards PT goals: Progressing toward goals    Frequency    Min 2X/week       AM-PAC PT 6 Clicks Mobility   Outcome Measure  Help needed turning from your back to your side while in a flat bed without using bedrails?: A Little Help needed moving from lying on your back to sitting on the side of a flat bed without using bedrails?: A Little Help needed moving to and from a bed to a chair (including a wheelchair)?: A Little Help needed standing up from a chair using your arms (e.g., wheelchair or bedside chair)?: A Little Help needed to walk in hospital room?: Total (>1ft) Help needed climbing 3-5 steps with a railing? : Total 6 Click Score: 14    End of Session Equipment Utilized During Treatment: Gait belt;Oxygen  Activity Tolerance: Patient limited by fatigue Patient left: with call bell/phone within reach;in chair;with family/visitor present Nurse Communication: Mobility status PT Visit Diagnosis: Unsteadiness on feet (R26.81);Other abnormalities of gait and mobility (R26.89);Muscle weakness (generalized) (M62.81);History of falling (Z91.81)     Time: 9149-9082 PT Time Calculation (min) (ACUTE ONLY): 27 min  Charges:    $Gait Training: 8-22 mins $Therapeutic Activity: 8-22 mins PT General Charges $$ ACUTE PT VISIT: 1 Visit                     Darryle George, PTA Acute Rehabilitation Services Secure Chat Preferred  [phone]    Darryle George 12/27/2023, 9:27 AM

## 2023-12-27 NOTE — Progress Notes (Signed)
 Occupational Therapy Treatment Patient Details Name: Courtney Cline MRN: [id number] DOB: 04/19/1933 Today's Date: 12/27/2023   History of present illness The pt is an 89 yo female presenting 11/6 with afib with RVR. Work up concerning for sepsis, admitted for resp failure likely due to PNA. PMH includes: HTN, HLD, afib, CAD, CKD III, CHF, breast cancer, obesity, COPD on 4L O2.   OT comments  Pt greeted in chair, agreeable for OT visit. Reported need to use the bathroom. She was min A for transfers and stepping to [facility] with cues for safe approach. Required mod A for thorough peri care following BM and setup for simple seated grooming in chair. OT session titrated to pt's hemodynamic response, limited by DOE and elevated HR during mobility. Moderately fatigued at end of session. Supportive son at bedside. Will continue to follow and progress as able.      If plan is discharge home, recommend the following:  A lot of help with walking and/or transfers;A lot of help with bathing/dressing/bathroom;Assistance with cooking/housework;Direct supervision/assist for medications management;Direct supervision/assist for financial management;Assist for transportation;Help with stairs or ramp for entrance   Equipment Recommendations       Recommendations for Other Services      Precautions / Restrictions Precautions Precautions: Fall Recall of Precautions/Restrictions: Impaired Precaution/Restrictions Comments: monitor HR, 4L O2 at baseline Restrictions Weight Bearing Restrictions Per Provider Order: No       Mobility Bed Mobility               General bed mobility comments: not assessed - pt OOB throughout session    Transfers Overall transfer level: Needs assistance Equipment used: Rolling walker (2 wheels) Transfers: Sit to/from Stand Sit to Stand: Min assist           General transfer comment: to [facility], cues for hand placement & powering up from chair     Balance  Overall balance assessment: Needs assistance Sitting-balance support: Bilateral upper extremity supported, Feet supported Sitting balance-Leahy Scale: Fair Sitting balance - Comments: seated unsupported in chair   Standing balance support: Bilateral upper extremity supported, During functional activity, Reliant on assistive device for balance Standing balance-Leahy Scale: Poor Standing balance comment: reliant on RW, cues for forward gaze & achieving upright posture                           ADL either performed or assessed with clinical judgement   ADL Overall ADL's : Needs assistance/impaired     Grooming: Set up;Sitting;Wash/dry hands Grooming Details (indicate cue type and reason): provided [place] for hand hygiene following toileting needs                 Toilet Transfer: Minimal assistance;Stand-pivot;BSC/3in1;Rolling walker (2 wheels) Toilet Transfer Details (indicate cue type and reason): cues for safe approach to BSC, used RW to take steps back to chair Toileting- Clothing Manipulation and Hygiene: Moderate assistance;Sitting/lateral lean;Sit to/from stand Toileting - Clothing Manipulation Details (indicate cue type and reason): assist for thorough peri care following BM     Functional mobility during ADLs: Minimal assistance;Rolling walker (2 wheels);Cueing for sequencing;Cueing for safety      Extremity/Trunk Assessment              Vision       Perception     Praxis     Communication Communication Communication: No apparent difficulties   Cognition Arousal: Alert Behavior During Therapy: WFL for tasks assessed/performed Cognition: No apparent impairments  Following commands: Intact        Cueing   Cueing Techniques: Verbal cues, Visual cues, Tactile cues  Exercises      Shoulder Instructions       General Comments      Pertinent Vitals/ Pain       Pain Assessment Pain  Assessment: No/denies pain Pain Score: 0-No pain  Home Living                                          Prior Functioning/Environment              Frequency  Min 2X/week        Progress Toward Goals  OT Goals(current goals can now be found in the care plan section)  Progress towards OT goals: Progressing toward goals     Plan      Co-evaluation                 AM-PAC OT 6 Clicks Daily Activity     Outcome Measure   Help from another person eating meals?: None Help from another person taking care of personal grooming?: A Little Help from another person toileting, which includes using toliet, bedpan, or urinal?: Total Help from another person bathing (including washing, rinsing, drying)?: A Lot Help from another person to put on and taking off regular upper body clothing?: A Lot Help from another person to put on and taking off regular lower body clothing?: Total 6 Click Score: 13    End of Session Equipment Utilized During Treatment: Rolling walker (2 wheels);Oxygen   OT Visit Diagnosis: Unsteadiness on feet (R26.81);Other abnormalities of gait and mobility (R26.89);Muscle weakness (generalized) (M62.81);History of falling (Z91.81)   Activity Tolerance Patient tolerated treatment well   Patient Left in chair;with call bell/phone within reach;with chair alarm set;with family/visitor present   Nurse Communication Mobility status        Time: 8896-8879 OT Time Calculation (min): 17 min  Charges: OT General Charges $OT Visit: 1 Visit OT Treatments $Self Care/Home Management : 8-22 mins  Courtney Cline, OTR/L [facility] Acute Rehabilitation Services [phone] Secure Chat Preferred  Courtney Cline 12/27/2023, 3:09 PM

## 2023-12-28 ENCOUNTER — Inpatient Hospital Stay (HOSPITAL_COMMUNITY)

## 2023-12-28 ENCOUNTER — Other Ambulatory Visit (HOSPITAL_COMMUNITY): Payer: Self-pay

## 2023-12-28 ENCOUNTER — Telehealth (HOSPITAL_COMMUNITY): Payer: Self-pay | Admitting: Pharmacy Technician

## 2023-12-28 DIAGNOSIS — J189 Pneumonia, unspecified organism: Secondary | ICD-10-CM | POA: Diagnosis not present

## 2023-12-28 DIAGNOSIS — R918 Other nonspecific abnormal finding of lung field: Secondary | ICD-10-CM | POA: Diagnosis not present

## 2023-12-28 DIAGNOSIS — J90 Pleural effusion, not elsewhere classified: Secondary | ICD-10-CM | POA: Diagnosis not present

## 2023-12-28 DIAGNOSIS — Z8673 Personal history of transient ischemic attack (TIA), and cerebral infarction without residual deficits: Secondary | ICD-10-CM | POA: Diagnosis not present

## 2023-12-28 DIAGNOSIS — R0989 Other specified symptoms and signs involving the circulatory and respiratory systems: Secondary | ICD-10-CM | POA: Diagnosis not present

## 2023-12-28 LAB — BASIC METABOLIC PANEL WITH GFR
Anion gap: 10 (ref 5–15)
BUN: 30 mg/dL — ABNORMAL HIGH (ref 8–23)
CO2: 31 mmol/L (ref 22–32)
Calcium: 8.5 mg/dL — ABNORMAL LOW (ref 8.9–10.3)
Chloride: 102 mmol/L (ref 98–111)
Creatinine, Ser: 2.32 mg/dL — ABNORMAL HIGH (ref 0.44–1.00)
GFR, Estimated: 20 mL/min — ABNORMAL LOW (ref >60)
Glucose, Bld: 87 mg/dL (ref 70–99)
Potassium: 3.8 mmol/L (ref 3.5–5.1)
Sodium: 143 mmol/L (ref 135–145)

## 2023-12-28 NOTE — Plan of Care (Signed)
   Problem: Coping: Goal: Level of anxiety will decrease Outcome: Progressing

## 2023-12-28 NOTE — Progress Notes (Signed)
 PROGRESS NOTE  Courtney Cline FMW:[id number] DOB: 1933-07-10 DOA: 12/21/2023 PCP: Courtney Charm, MD   LOS: 7 days   Brief narrative:  89 y.o. female with medical history significant of hypertension, hyperlipidemia, atrial fibrillation, CAD, CKD 3B, CHF, breast cancer, obesity, anemia, COPD, chronic respiratory failure on 3 L presented hospital with worsening shortness of breath for 2 days with cough.  EMS was called in and patient was noted to have A-fib with RVR with heart rate up to 200s.  Chest x-ray showed small pleural effusion with right lower lobe atelectasis versus airspace disease.  Initially patient and spontaneously converted but subsequently had A-fib with RVR and required cardiology evaluation with subsequent Cardizem followed by amiodarone drip and now on oral amiodarone.  At this time, patient has completed course of antibiotics.  PT recommended skilled nursing facility which family initially refused and now wish to reconsider.    Assessment/Plan: Principal Problem:   Sepsis (HCC) Active Problems:   Breast cancer of upper-inner quadrant of right female breast (HCC)   Essential hypertension   Hyperlipidemia   Diastolic CHF (HCC)   Persistent atrial fibrillation (HCC)   CAP (community acquired pneumonia)   Acute on chronic respiratory failure with hypoxia (HCC)   CKD stage 3b, GFR 30-44 ml/min (HCC)   Obesity (BMI 30-39.9)   Normocytic anemia   COPD with acute exacerbation (HCC)   History of CVA (cerebrovascular accident)   Acute on chronic hypoxemic respiratory failure Improved at this time.  Back to baseline.  Respiratory failure thought to be secondary to pneumonia as well as diastolic heart failure.  Patient is on 4 L of oxygen  at baseline.  Leukocytosis has resolved.  Completed course of antibiotic.  On oral Lasix  at this time.  Patient continued to have congestion and x-ray was repeated which shows increased bibasilar opacities concerning for worsening  edema or infection with small bilateral pleural effusion.  Will check speech and swallow.  Aspiration precautions.  Mild hypokalemia.  Was replenished.  Latest potassium of 3.8.   Sepsis due to pneumonia Sepsis physiology has resolved.  Chest x-ray showed infiltrate.  Has completed 6-day course of antibiotic.  Currently on 4 L of oxygen  by nasal cannula but chest x-ray with increased infiltrate.  Will continue to monitor closely.  Add incentive spirometry.  Aspiration precautions.  Check speech and swallow  Atrial fibrillation with RVR Initially on Cardizem drip but due to low blood pressure was changed to amiodarone drip after cardiology was consulted.  This has been changed to amiodarone 200 mg twice a day at this time.  Continue metoprolol  as well.  Plan is to follow-up with cardiology as outpatient.  Rate is better controlled today.  Essential hypertension Continue metoprolol .  Blood pressure stable.   Hyperlipidemia Continue Lipitor    History of CVA. Continue Eliquis  and Lipitor .   AKI on CKD 3B Latest creatinine of 2.3 from 1.9<1.6.  Will continue to monitor BMP.  Hold with oral Lasix .  Check levels in AM.  Due to increasing infiltrate in the lungs and effusion we will hold off with IV fluids.  Encouraged oral hydration.   Acute on chronic diastolic CHF 2D echocardiogram from September 2025 showed LV ejection fraction of 60 to 65% with grade 2 diastolic dysfunction.  Patient has responded well with IV Lasix .  BNP elevated at 489.  IV Lasix  has been switched to 40 mg orally daily but due to rising creatinine we will hold for today..  Patient is overall negative balance for 5364  mL from yesterday.  Will add strict intake and output charting.SABRA   History of breast cancer. No acute issues at this time  Anemia Latest hemoglobin has been stable at 7.4.  Will recheck CBC in AM.   COPD Continue bronchodilators from home.  Debility deconditioning.  Patient's son had initially refused  skilled nursing facility placement, but now reconsidering skilled nursing facility.  DVT prophylaxis: apixaban  (ELIQUIS ) tablet 2.5 mg Start: 12/21/23 2200 apixaban  (ELIQUIS ) tablet 2.5 mg   Disposition: Skilled nursing facility likely in 1 to 2 days  Status is: Inpatient Remains inpatient appropriate because: Pending clinical improvement, need for skilled nursing facility placement    Code Status:     Code Status: Limited: Do not attempt resuscitation (DNR) -DNR-LIMITED -Do Not Intubate/DNI   Family Communication: Spoke with the patient's son at bedside  Consultants: Cardiology  Procedures: None  Anti-infectives:  None current  Anti-infectives (From admission, onward)    Start     Dose/Rate Route Frequency Ordered Stop   12/25/23 1000  azithromycin  (ZITHROMAX ) tablet 500 mg  Status:  Discontinued        500 mg Oral Daily 12/24/23 1157 12/26/23 1007   12/25/23 0800  cefUROXime (CEFTIN) tablet 500 mg  Status:  Discontinued        500 mg Oral 2 times daily with meals 12/24/23 1158 12/24/23 1540   12/25/23 0800  cefUROXime (CEFTIN) tablet 250 mg  Status:  Discontinued        250 mg Oral 2 times daily with meals 12/24/23 1540 12/26/23 1007   12/22/23 1000  cefTRIAXone  (ROCEPHIN ) 2 g in sodium chloride  0.9 % 100 mL IVPB  Status:  Discontinued        2 g 200 mL/hr over 30 Minutes Intravenous Every 24 hours 12/21/23 1410 12/24/23 1157   12/22/23 1000  azithromycin  (ZITHROMAX ) 500 mg in sodium chloride  0.9 % 250 mL IVPB  Status:  Discontinued        500 mg 250 mL/hr over 60 Minutes Intravenous Every 24 hours 12/21/23 1410 12/24/23 1157   12/21/23 1145  cefTRIAXone  (ROCEPHIN ) 2 g in sodium chloride  0.9 % 100 mL IVPB        2 g 200 mL/hr over 30 Minutes Intravenous Once 12/21/23 1141 12/21/23 1234   12/21/23 1145  azithromycin  (ZITHROMAX ) 500 mg in sodium chloride  0.9 % 250 mL IVPB        500 mg 250 mL/hr over 60 Minutes Intravenous  Once 12/21/23 1141 12/21/23 1313         Subjective: Today, patient was seen and examined at bedside.  Patient still complains of cough and congestion but denies any pain nausea or vomiting.  Patient's son at bedside and states that she feels depressed.    Objective: Vitals:   12/28/23 0444 12/28/23 0722  BP: (!) 117/55 (!) 113/42  Pulse: 70 69  Resp: 17 18  Temp: 97.9 F (36.6 C) 98.4 F (36.9 C)  SpO2: 99% 98%   No intake or output data in the 24 hours ending 12/28/23 1043  Filed Weights   12/21/23 1147  Weight: 68 kg   Body mass index is 29.29 kg/m.   Physical Exam:  GENERAL: Patient is alert awake and oriented. Not in obvious distress.  On 4 L of oxygen , elderly female deconditioned  HENT: No scleral pallor or icterus. Pupils equally reactive to light. Oral mucosa is moist NECK: is supple, no gross swelling noted. CHEST: Coarse breath sounds noted bilaterally. CVS: S1 and S2  heard, no murmur.  Irregular rhythm. ABDOMEN: Soft, non-tender, bowel sounds are present. EXTREMITIES: No edema. CNS: Cranial nerves are intact.  Generalized weakness noted.  Moves all 4 extremities. SKIN: warm and dry without rashes.  Data Review: I have personally reviewed the following laboratory data and studies,  CBC: Recent Labs  Lab 12/21/23 1139 12/22/23 0517 12/25/23 0242  WBC 14.8* 10.6* 7.5  NEUTROABS 12.2*  --   --   HGB 8.6* 7.3* 7.4*  HCT 28.3* 24.1* 24.8*  MCV 98.3 97.6 96.9  PLT 393 306 363   Basic Metabolic Panel: Recent Labs  Lab 12/21/23 1139 12/22/23 0131 12/24/23 0121 12/25/23 0242 12/27/23 0140 12/28/23 0448  NA 142 141 145 143 144 143  K 4.6 4.3 3.9 3.4* 4.0 3.8  CL 105 105 103 104 104 102  CO2 24 25 29 30 27 31   GLUCOSE 125* 107* 160* 100* 198* 87  BUN 18 17 23 21  25* 30*  CREATININE 1.68* 1.46* 1.64* 1.62* 1.97* 2.32*  CALCIUM  8.9 8.4* 8.5* 8.4* 8.3* 8.5*  MG 2.1  --   --  2.0  --   --    Liver Function Tests: Recent Labs  Lab 12/21/23 1139 12/22/23 0131  AST 26 16  ALT 17 12   ALKPHOS 50 39  BILITOT 0.8 0.5  PROT 7.0 5.4*  ALBUMIN 2.7* 2.1*   No results for input(s): LIPASE, AMYLASE in the last 168 hours. No results for input(s): AMMONIA in the last 168 hours. Cardiac Enzymes: No results for input(s): CKTOTAL, CKMB, CKMBINDEX, TROPONINI in the last 168 hours. BNP (last 3 results) Recent Labs    02/13/23 2158 12/22/23 0517  BNP 361.3* 489.6*    ProBNP (last 3 results) No results for input(s): PROBNP in the last 8760 hours.  CBG: No results for input(s): GLUCAP in the last 168 hours. Recent Results (from the past 240 hours)  Resp panel by RT-PCR (RSV, Flu A&B, Covid) Anterior Nasal Swab     Status: None   Collection Time: 12/21/23 11:39 AM   Specimen: Anterior Nasal Swab  Result Value Ref Range Status   SARS Coronavirus 2 by RT PCR NEGATIVE NEGATIVE Final   Influenza A by PCR NEGATIVE NEGATIVE Final   Influenza B by PCR NEGATIVE NEGATIVE Final    Comment: (NOTE) The Xpert Xpress SARS-CoV-2/FLU/RSV plus assay is intended as an aid in the diagnosis of influenza from Nasopharyngeal swab specimens and should not be used as a sole basis for treatment. Nasal washings and aspirates are unacceptable for Xpert Xpress SARS-CoV-2/FLU/RSV testing.  Fact Sheet for Patients: bloggercourse.com  Fact Sheet for Healthcare Providers: seriousbroker.it  This test is not yet approved or cleared by the [place]  FDA and has been authorized for detection and/or diagnosis of SARS-CoV-2 by FDA under an Emergency Use Authorization (EUA). This EUA will remain in effect (meaning this test can be used) for the duration of the COVID-19 declaration under Section 564(b)(1) of the Act, 21 U.S.C. section 360bbb-3(b)(1), unless the authorization is terminated or revoked.     Resp Syncytial Virus by PCR NEGATIVE NEGATIVE Final    Comment: (NOTE) Fact Sheet for  Patients: bloggercourse.com  Fact Sheet for Healthcare Providers: seriousbroker.it  This test is not yet approved or cleared by the [place]  FDA and has been authorized for detection and/or diagnosis of SARS-CoV-2 by FDA under an Emergency Use Authorization (EUA). This EUA will remain in effect (meaning this test can be used) for the duration of the COVID-19 declaration  under Section 564(b)(1) of the Act, 21 U.S.C. section 360bbb-3(b)(1), unless the authorization is terminated or revoked.  Performed at [facility], [address] [place], [name] [address]   Blood Culture (routine x 2)     Status: None   Collection Time: 12/21/23 11:39 AM   Specimen: BLOOD  Result Value Ref Range Status   Specimen Description BLOOD LEFT ANTECUBITAL  Final   Special Requests   Final    BOTTLES DRAWN AEROBIC AND ANAEROBIC Blood Culture adequate volume   Culture   Final    NO GROWTH 5 DAYS Performed at [facility], [address], [place], [name] [address]    Report Status 12/26/2023 FINAL  Final  Blood Culture (routine x 2)     Status: None   Collection Time: 12/21/23 11:44 AM   Specimen: BLOOD  Result Value Ref Range Status   Specimen Description BLOOD RIGHT ANTECUBITAL  Final   Special Requests   Final    BOTTLES DRAWN AEROBIC AND ANAEROBIC Blood Culture results may not be optimal due to an inadequate volume of blood received in culture bottles   Culture   Final    NO GROWTH 5 DAYS Performed at [facility], [address] [place], [name] [address]    Report Status 12/26/2023 FINAL  Final     Studies: DG CHEST PORT 1 VIEW Result Date: 12/28/2023 EXAM: 1 VIEW XRAY OF THE CHEST 12/28/2023 08:49:00 AM COMPARISON: Comparison 7 days ago. CLINICAL HISTORY: Congestion of respiratory tract. FINDINGS: LUNGS AND PLEURA: Increased bibasilar opacities are noted concerning for worsening edema or infiltrates. Small bilateral  pleural effusions. No pneumothorax. HEART AND MEDIASTINUM: No acute abnormality of the cardiac and mediastinal silhouettes. BONES AND SOFT TISSUES: No acute osseous abnormality. IMPRESSION: 1. Increased bibasilar opacities concerning for worsening edema or infection. 2. Small bilateral pleural effusions. Electronically signed by: Lynwood Seip MD 12/28/2023 09:13 AM EST RP Workstation: [id number]      Vernal Alstrom, MD  [facility] 12/28/2023  If 7PM-7AM, please contact night-coverage

## 2023-12-28 NOTE — Telephone Encounter (Signed)
 Patient Product/process Development Scientist completed.    The patient is insured through [employer]. Patient has Medicare and is not eligible for a copay card, but may be able to apply for patient assistance or Medicare RX Payment Plan (Patient Must reach out to their plan, if eligible for payment plan), if available.    Ran test claim for Eliquis  2.5 mg and the current 30 day co-pay is $0.00.   This test claim was processed through [facility]- copay amounts may vary at other pharmacies due to pharmacy/plan contracts, or as the patient moves through the different stages of their insurance plan.     Reyes Sharps, CPHT Pharmacy Technician Patient Advocate Specialist Lead [facility] Patient Advocate Team Direct Number: [phone]  Fax: [phone]

## 2023-12-28 NOTE — Progress Notes (Signed)
 Modified Barium Swallow Study  Patient Details  Name: Courtney Cline MRN: [id number] Date of Birth: 1934/02/01  Today's Date: 12/28/2023  Modified Barium Swallow completed.  Full report located under Chart Review in the Imaging Section.  History of Present Illness 89 yo female presenting to ED with SOB and cough x2 days. CXR shows small pleural effusion with RLL atelectasis vs airspace disease, repeat CXR 11/13 showed increased infiltrate after 6 day course of abx. MBS 01/23/20 did not show penetration/aspiration but esophageal stasis, recommending regular diet with thin liquids. Seen by SLP in AIR primarily for cognitive-linguistic goals s/p CVA but with modification of her diet to Dys 1-Dys 2 due to edentulism and pt preference. PMH includes HTN, HLD, A-fib, CAD, CKD 3B, CHF, breast cancer, anemia, COPD, chronic respiratory failure on 3L, prior L thalamic CVA (10/2023)   Clinical Impression Pt presents with functional oropharyngeal swallowing. The swallow is initiated at the posterior angle of the ramus, though the head of the bolus reaches the laryngeal vestibule prior to achieving complete closure with thin liquids. Trace penetration is completely expelled with laryngeal elevation and is considered WFL (PAS 2). Otherwise, no penetration/aspiration occurs. She clears her oral cavity of bite-sized solids despite edentulism but prefers to continue current pureed diet. The barium tablet was split in half and given with purees with minimal retention in the distal esophagus. Given pt's clinical presentation with globus sensation and regurgitation in addition to more prolific retention noted on previous MBS, could still consider dedicated assessment if indicated. Education was provided regarding esophageal and aspiration precautions. Continue current diet without ongoing SLP f/u. Factors that may increase risk of adverse event in presence of aspiration Noe & Lianne 2021): Respiratory or GI  disease;Reduced cognitive function  Swallow Evaluation Recommendations Recommendations: PO diet PO Diet Recommendation: Dysphagia 1 (Pureed);Thin liquids (Level 0) Liquid Administration via: Cup;Straw Medication Administration: Crushed with puree Supervision: Full assist for feeding;Full supervision/cueing for swallowing strategies Swallowing strategies  : Minimize environmental distractions;Slow rate;Small bites/sips Postural changes: Position pt fully upright for meals;Stay upright 30-60 min after meals Oral care recommendations: Oral care BID (2x/day) Recommended consults: Consider esophageal assessment    Damien Blumenthal, M.A., CCC-SLP Speech Language Pathology, Acute Rehabilitation Services  Secure Chat preferred [phone]  12/28/2023,3:54 PM

## 2023-12-28 NOTE — Progress Notes (Signed)
 Occupational Therapy Treatment Patient Details Name: Courtney Cline MRN: [id number] DOB: 09-29-1933 Today's Date: 12/28/2023   History of present illness The pt is an 89 yo female presenting 11/6 with afib with RVR. Work up concerning for sepsis, admitted for resp failure likely due to PNA. PMH includes: HTN, HLD, afib, CAD, CKD III, CHF, breast cancer, obesity, COPD on 4L O2.   OT comments  Patient demonstrating good gains with bed mobility, requiring CGA to get to EOB. Patient ambulated 2-3 feet to chair to performed grooming tasks at sink. Patient able to stand at sink for less than one minute for grooming tasks before requiring seated rest break. Patient remained in recliner at end of session.  Patient will benefit from continued inpatient follow up therapy, <3 hours/day.  Acute OT to continue to follow to address established goals to facilitate DC to next venue of care.        If plan is discharge home, recommend the following:  A lot of help with walking and/or transfers;A lot of help with bathing/dressing/bathroom;Assistance with cooking/housework;Direct supervision/assist for medications management;Direct supervision/assist for financial management;Assist for transportation;Help with stairs or ramp for entrance   Equipment Recommendations  Other (comment) (defer to next level of care)    Recommendations for Other Services      Precautions / Restrictions Precautions Precautions: Fall Recall of Precautions/Restrictions: Impaired Precaution/Restrictions Comments: monitor HR, 4L O2 at baseline Restrictions Weight Bearing Restrictions Per Provider Order: No       Mobility Bed Mobility Overal bed mobility: Needs Assistance Bed Mobility: Supine to Sit     Supine to sit: Contact guard, HOB elevated, Used rails     General bed mobility comments: CGA for safety    Transfers Overall transfer level: Needs assistance Equipment used: Rolling walker (2 wheels) Transfers:  Sit to/from Stand, Bed to chair/wheelchair/BSC Sit to Stand: Min assist     Step pivot transfers: Min assist     General transfer comment: cues for hand placement and min assist to power up and to steady     Balance Overall balance assessment: Needs assistance Sitting-balance support: Bilateral upper extremity supported, Feet supported Sitting balance-Leahy Scale: Fair Sitting balance - Comments: EOB   Standing balance support: Bilateral upper extremity supported, During functional activity, Reliant on assistive device for balance Standing balance-Leahy Scale: Poor Standing balance comment: reliant on RW and sink for support when standing, patient with limited standing tolerance                           ADL either performed or assessed with clinical judgement   ADL Overall ADL's : Needs assistance/impaired     Grooming: Wash/dry hands;Wash/dry face;Oral care;Contact guard assist;Standing Grooming Details (indicate cue type and reason): performed standing at sink with limited standing tolerance         Upper Body Dressing : Minimal assistance;Sitting Upper Body Dressing Details (indicate cue type and reason): gown for back Lower Body Dressing: Total assistance Lower Body Dressing Details (indicate cue type and reason): for socks Toilet Transfer: Minimal assistance;Rolling walker (2 wheels)           Functional mobility during ADLs: Minimal assistance;Rolling walker (2 wheels);Cueing for sequencing;Cueing for safety      Extremity/Trunk Assessment              Vision       Perception     Praxis     Communication Communication Communication: No apparent difficulties  Cognition Arousal: Alert Behavior During Therapy: WFL for tasks assessed/performed Cognition: No apparent impairments                               Following commands: Intact        Cueing   Cueing Techniques: Verbal cues, Visual cues, Tactile cues  Exercises       Shoulder Instructions       General Comments      Pertinent Vitals/ Pain       Pain Assessment Pain Assessment: No/denies pain  Home Living                                          Prior Functioning/Environment              Frequency  Min 2X/week        Progress Toward Goals  OT Goals(current goals can now be found in the care plan section)  Progress towards OT goals: Progressing toward goals  Acute Rehab OT Goals Patient Stated Goal: to go to rehab OT Goal Formulation: With patient Time For Goal Achievement: 01/07/24 Potential to Achieve Goals: Good ADL Goals Pt Will Perform Grooming: with supervision;sitting Pt Will Perform Upper Body Dressing: with supervision;sitting Pt Will Perform Lower Body Dressing: with min assist;sitting/lateral leans Pt Will Transfer to Toilet: with contact guard assist;bedside commode Pt Will Perform Toileting - Clothing Manipulation and hygiene: with contact guard assist;sitting/lateral leans;sit to/from stand Pt Will Perform Tub/Shower Transfer: Tub transfer;Shower transfer;with supervision;ambulating Additional ADL Goal #1: Pt will recall and utilize 3 energy conservation/work simplification strategies during ADLs and functional mobility.  Plan      Co-evaluation                 AM-PAC OT 6 Clicks Daily Activity     Outcome Measure   Help from another person eating meals?: None Help from another person taking care of personal grooming?: A Little Help from another person toileting, which includes using toliet, bedpan, or urinal?: Total Help from another person bathing (including washing, rinsing, drying)?: A Lot Help from another person to put on and taking off regular upper body clothing?: A Lot Help from another person to put on and taking off regular lower body clothing?: Total 6 Click Score: 13    End of Session Equipment Utilized During Treatment: Rolling walker (2 wheels);Gait  belt;Oxygen   OT Visit Diagnosis: Unsteadiness on feet (R26.81);Other abnormalities of gait and mobility (R26.89);Muscle weakness (generalized) (M62.81);History of falling (Z91.81)   Activity Tolerance Patient tolerated treatment well   Patient Left in chair;with call bell/phone within reach;with chair alarm set   Nurse Communication Mobility status        Time: 8688-8666 OT Time Calculation (min): 22 min  Charges: OT General Charges $OT Visit: 1 Visit OT Treatments $Self Care/Home Management : 8-22 mins  Dick Laine, OTA Acute Rehabilitation Services  Office [phone]   Jeb LITTIE Laine 12/28/2023, 3:05 PM

## 2023-12-28 NOTE — Evaluation (Addendum)
 Clinical/Bedside Swallow Evaluation Patient Details  Name: Courtney Cline MRN: [id number] Date of Birth: 05/25/1933  Today's Date: 12/28/2023 Time: SLP Start Time (ACUTE ONLY): 1132 SLP Stop Time (ACUTE ONLY): 1147 SLP Time Calculation (min) (ACUTE ONLY): 15 min  Past Medical History:  Past Medical History:  Diagnosis Date   Acute cystitis without hematuria    Acute metabolic encephalopathy 11/04/2023   AKI (acute kidney injury) 02/15/2021   Allergic rhinitis    Arthritis    Breast calcification seen on mammogram    Right breast   Breast cancer (HCC)    COPD (chronic obstructive pulmonary disease) (HCC)    GERD (gastroesophageal reflux disease)    Hyperglycemia    Hyperlipidemia    Hypertension    Low back pain    On home oxygen  therapy    all the time   Osteopenia    Peripheral edema    Shortness of breath    Subclavian artery stenosis, left    Vitamin D  deficiency    Wears dentures    top   Wears glasses    reading   Past Surgical History:  Past Surgical History:  Procedure Laterality Date   BREAST BIOPSY     BREAST LUMPECTOMY     right 2015   BREAST LUMPECTOMY WITH RADIOACTIVE SEED LOCALIZATION Right 12/20/2013   Procedure: RIGHT BREAST LUMPECTOMY WITH RADIOACTIVE SEED LOCALIZATION;  Surgeon: Debby Shipper, MD;  Location: [facility];  Service: General;  Laterality: Right;   CAROTID DUPLEX  2014   CATARACT EXTRACTION     both eyes   HPI:  89 yo female presenting to ED with SOB and cough x2 days. CXR shows small pleural effusion with RLL atelectasis vs airspace disease, repeat CXR 11/13 showed increased infiltrate after 6 day course of abx. MBS 01/23/20 did not show penetration/aspiration but esophageal stasis, recommending regular diet with thin liquids. Seen by SLP in AIR primarily for cognitive-linguistic goals s/p CVA but with modification of her diet to Dys 1-Dys 2 due to edentulism and pt preference. PMH includes HTN, HLD, A-fib, CAD, CKD  3B, CHF, breast cancer, anemia, COPD, chronic respiratory failure on 3L, prior L thalamic CVA (10/2023)    Assessment / Plan / Recommendation  Clinical Impression  Pt denies difficulty swallowing baseline Dys 1 diet with thin liquids but occasionally experiences globus sensation and regurgitation with meds. RN was providing meds crushed in pudding upon SLP arrival, which pt regurgitated. Subsequent sips of water were observed without signs clinically concerning for aspiration. Previous MBS 2021 notes concern for esophageal retention, which is suspected to contribute to globus sensation and regurgitation but given pt's worsening CXR despite full course of abx and history of CVA, will repeat an MBS for further assessment. SLP Visit Diagnosis: Dysphagia, unspecified (R13.10)    Aspiration Risk  Mild aspiration risk    Diet Recommendation Dysphagia 1 (Puree);Thin liquid    Liquid Administration via: Cup;Straw Medication Administration: Crushed with puree Supervision: Staff to assist with self feeding;Full supervision/cueing for compensatory strategies Compensations: Slow rate;Small sips/bites Postural Changes: Seated upright at 90 degrees;Remain upright for at least 30 minutes after po intake    Other  Recommendations Recommended Consults: Consider esophageal assessment Oral Care Recommendations: Oral care BID     Assistance Recommended at Discharge    Functional Status Assessment Patient has had a recent decline in their functional status and demonstrates the ability to make significant improvements in function in a reasonable and predictable amount of time.  Frequency  and Duration min 2x/week  2 weeks       Prognosis Prognosis for improved oropharyngeal function: Good Barriers to Reach Goals: Cognitive deficits;Time post onset      Swallow Study   General HPI: 89 yo female presenting to ED with SOB and cough x2 days. CXR shows small pleural effusion with RLL atelectasis vs airspace  disease, repeat CXR 11/13 showed increased infiltrate after 6 day course of abx. MBS 01/23/20 did not show penetration/aspiration but esophageal stasis, recommending regular diet with thin liquids. Seen by SLP in AIR primarily for cognitive-linguistic goals s/p CVA but with modification of her diet to Dys 1-Dys 2 due to edentulism and pt preference. PMH includes HTN, HLD, A-fib, CAD, CKD 3B, CHF, breast cancer, anemia, COPD, chronic respiratory failure on 3L, prior L thalamic CVA (10/2023) Type of Study: Bedside Swallow Evaluation Previous Swallow Assessment: see HPI Diet Prior to this Study: Dysphagia 1 (pureed);Thin liquids (Level 0) Temperature Spikes Noted: No Respiratory Status: Nasal cannula History of Recent Intubation: No Behavior/Cognition: Alert;Cooperative Oral Cavity Assessment: Within Functional Limits Oral Care Completed by SLP: No Oral Cavity - Dentition: Edentulous Vision: Functional for self-feeding Self-Feeding Abilities: Able to feed self Patient Positioning: Upright in bed Baseline Vocal Quality: Normal Volitional Cough: Strong Volitional Swallow: Able to elicit    Oral/Motor/Sensory Function Overall Oral Motor/Sensory Function: Within functional limits   Ice Chips Ice chips: Not tested   Thin Liquid Thin Liquid: Within functional limits Presentation: Straw;Self Fed    Nectar Thick Nectar Thick Liquid: Not tested   Honey Thick Honey Thick Liquid: Not tested   Puree Puree: Within functional limits Presentation: Spoon   Solid     Solid: Not tested      Damien Blumenthal, M.A., CCC-SLP Speech Language Pathology, Acute Rehabilitation Services  Secure Chat preferred [phone]  12/28/2023,12:59 PM

## 2023-12-28 NOTE — TOC Progression Note (Signed)
 Transition of Care [facility]) - Progression Note    Patient Details  Name: Courtney Cline MRN: [id number] Date of Birth: 1933-04-28  [facility] CM/SW Contact  [name], [name] Phone Number: 12/28/2023, 4:08 PM  Clinical Narrative:   CSW noting medical issues ongoing, not medically stable for discharge. Insurance authorization request on hold until patient is stable. Brief update given to [facility] CSW to follow.    Expected Discharge Plan: Skilled Nursing Facility Barriers to Discharge: Continued Medical Work up, English As A Second Language Teacher               Expected Discharge Plan and Services       Living arrangements for the past 2 months: Single Family Home                                       Social Drivers of Health (SDOH) Interventions SDOH Screenings   Food Insecurity: No Food Insecurity (12/21/2023)  Housing: Low Risk  (12/21/2023)  Transportation Needs: No Transportation Needs (12/21/2023)  Utilities: Not At Risk (12/21/2023)  Social Connections: Socially Isolated (12/21/2023)  Tobacco Use: Medium Risk (12/21/2023)    Readmission Risk Interventions    02/15/2023    1:37 PM  Readmission Risk Prevention Plan  Transportation Screening Complete  PCP or Specialist Appt within 5-7 Days Complete  Home Care Screening Complete  Medication Review (RN CM) Complete

## 2023-12-29 DIAGNOSIS — A419 Sepsis, unspecified organism: Secondary | ICD-10-CM | POA: Diagnosis not present

## 2023-12-29 DIAGNOSIS — R6520 Severe sepsis without septic shock: Secondary | ICD-10-CM | POA: Diagnosis not present

## 2023-12-29 DIAGNOSIS — J9601 Acute respiratory failure with hypoxia: Secondary | ICD-10-CM | POA: Diagnosis not present

## 2023-12-29 LAB — BASIC METABOLIC PANEL WITH GFR
Anion gap: 7 (ref 5–15)
BUN: 37 mg/dL — ABNORMAL HIGH (ref 8–23)
CO2: 31 mmol/L (ref 22–32)
Calcium: 8.4 mg/dL — ABNORMAL LOW (ref 8.9–10.3)
Chloride: 103 mmol/L (ref 98–111)
Creatinine, Ser: 2.22 mg/dL — ABNORMAL HIGH (ref 0.44–1.00)
GFR, Estimated: 21 mL/min — ABNORMAL LOW (ref >60)
Glucose, Bld: 77 mg/dL (ref 70–99)
Potassium: 4.1 mmol/L (ref 3.5–5.1)
Sodium: 141 mmol/L (ref 135–145)

## 2023-12-29 LAB — CBC
HCT: 22.9 % — ABNORMAL LOW (ref 36.0–46.0)
Hemoglobin: 6.8 g/dL — CL (ref 12.0–15.0)
MCH: 29.1 pg (ref 26.0–34.0)
MCHC: 29.7 g/dL — ABNORMAL LOW (ref 30.0–36.0)
MCV: 97.9 fL (ref 80.0–100.0)
Platelets: 376 K/uL (ref 150–400)
RBC: 2.34 MIL/uL — ABNORMAL LOW (ref 3.87–5.11)
RDW: 15.4 % (ref 11.5–15.5)
WBC: 8.3 K/uL (ref 4.0–10.5)
nRBC: 0.2 % (ref 0.0–0.2)

## 2023-12-29 LAB — HEMOGLOBIN AND HEMATOCRIT, BLOOD
HCT: 28 % — ABNORMAL LOW (ref 36.0–46.0)
Hemoglobin: 8.7 g/dL — ABNORMAL LOW (ref 12.0–15.0)

## 2023-12-29 LAB — PREPARE RBC (CROSSMATCH)

## 2023-12-29 LAB — PHOSPHORUS: Phosphorus: 2.9 mg/dL (ref 2.5–4.6)

## 2023-12-29 MED ORDER — SODIUM CHLORIDE 0.9% IV SOLUTION: Freq: Once | INTRAVENOUS | Status: AC

## 2023-12-29 NOTE — Care Management Important Message (Signed)
 Important Message  Patient Details  Name: Courtney Cline MRN: [id number] Date of Birth: 1934-02-08   Important Message Given:  Yes - Medicare IM     Claretta Deed 12/29/2023, 3:16 PM

## 2023-12-29 NOTE — Progress Notes (Addendum)
 PROGRESS NOTE  Courtney Cline FMW:[id number] DOB: 1933-08-01 DOA: 12/21/2023 PCP: Elliot Charm, MD   LOS: 8 days   Brief narrative:  89 y.o. female with medical history significant of hypertension, hyperlipidemia, atrial fibrillation, CAD, CKD 3B, CHF, breast cancer, obesity, anemia, COPD, chronic respiratory failure on 3 L presented hospital with worsening shortness of breath for 2 days with cough.  EMS was called in and patient was noted to have A-fib with RVR with heart rate up to 200s.  Chest x-ray showed small pleural effusion with right lower lobe atelectasis versus airspace disease.  Initially patient and spontaneously converted but subsequently had A-fib with RVR and required cardiology evaluation with subsequent Cardizem followed by amiodarone drip and now on oral amiodarone.  At this time, patient has completed course of antibiotics.  PT recommended skilled nursing facility which family initially refused and now wish to reconsider.    Assessment/Plan: Principal Problem:   Sepsis (HCC) Active Problems:   Breast cancer of upper-inner quadrant of right female breast (HCC)   Essential hypertension   Hyperlipidemia   Diastolic CHF (HCC)   Persistent atrial fibrillation (HCC)   CAP (community acquired pneumonia)   Acute on chronic respiratory failure with hypoxia (HCC)   CKD stage 3b, GFR 30-44 ml/min (HCC)   Obesity (BMI 30-39.9)   Normocytic anemia   COPD with acute exacerbation (HCC)   History of CVA (cerebrovascular accident)   Acute on chronic hypoxemic respiratory failure   Respiratory failure thought to be secondary to pneumonia as well as diastolic heart failure.  Patient is on 4 L of oxygen  at baseline and is currently on 4 L but still continues to have congestion and cough and shortness of breath.  Repeat chest x-ray showed congestion and increased opacities.  Speech and swallow evaluation done due to history of esophageal dysphagia.  Currently on dysphagia  1 diet which she is on at home.  Will need to continue aspiration precautions.  Afebrile at this time.  Completed 6-day course of antibiotic.  Mild hypokalemia.  Was replenished.  Latest potassium of 4.1   Severe sepsis due to pneumonia Sepsis physiology has resolved.  Patient had elevated lactic more than 2 with pneumonia and fever and leukocytosis on presentation suggestive of severe sepsis.  Chest x-ray showed infiltrate with repeat x-ray showing some worsening infiltrates..  Has completed 6-day course of antibiotic.  Currently on 4 L of oxygen  by nasal cannula .  Will continue to monitor closely.  Continue incentive spirometry.  Aspiration precautions.    Atrial fibrillation with RVR Rate controlled at this time initially on Cardizem drip but due to low blood pressure was changed to amiodarone drip after cardiology was consulted.  This has been changed to amiodarone 200 mg twice a day at this time.  Continue metoprolol  as well.  Plan is to follow-up with cardiology as outpatient.    Essential hypertension Continue metoprolol .  Blood pressure stable.  Latest blood pressure from 121/34   Hyperlipidemia Continue Lipitor    History of CVA. Continue Eliquis  and Lipitor .   AKI on CKD 3B Latest creatinine of 2.2 from 2.3 <1.9<1.6.  On oral hydration.  Will receive 1 unit of packed RBC today.  Due to infiltrate in the lungs and concern for CHF hold off with IV fluids.  Monitor BMP in AM.    Acute on chronic diastolic CHF 2D echocardiogram from September 2025 showed LV ejection fraction of 60 to 65% with grade 2 diastolic dysfunction.  Patient has responded well  with IV Lasix .  BNP elevated at 489.  IV Lasix  had been switched to 40 mg orally daily but due to rising creatinine we will continue to hold again today..  Patient is overall negative balance for 6614 mL.  Continue strict intake and output charting.  Will receive 1 unit of packed RBC today.   History of breast cancer. No acute issues at  this time  Anemia Latest hemoglobin of 6.8 from 7.4.  Patient will receive 1 unit of packed RBC today.   COPD Continue bronchodilators from home.  Add incentive spirometry deep breathing.  Debility deconditioning.  Patient's son had initially refused skilled nursing facility placement, but now reconsidering skilled nursing facility.  DVT prophylaxis: apixaban  (ELIQUIS ) tablet 2.5 mg Start: 12/21/23 2200 apixaban  (ELIQUIS ) tablet 2.5 mg   Disposition: Skilled nursing facility likely in 1 to 2 days  Status is: Inpatient Remains inpatient appropriate because: Pending clinical improvement, need for skilled nursing facility placement    Code Status:     Code Status: Limited: Do not attempt resuscitation (DNR) -DNR-LIMITED -Do Not Intubate/DNI   Family Communication: Spoke with the patient's son at bedside on 12/28/2023  Consultants: Cardiology  Procedures: None  Anti-infectives:  Completed course of antibiotic  Anti-infectives (From admission, onward)    Start     Dose/Rate Route Frequency Ordered Stop   12/25/23 1000  azithromycin  (ZITHROMAX ) tablet 500 mg  Status:  Discontinued        500 mg Oral Daily 12/24/23 1157 12/26/23 1007   12/25/23 0800  cefUROXime (CEFTIN) tablet 500 mg  Status:  Discontinued        500 mg Oral 2 times daily with meals 12/24/23 1158 12/24/23 1540   12/25/23 0800  cefUROXime (CEFTIN) tablet 250 mg  Status:  Discontinued        250 mg Oral 2 times daily with meals 12/24/23 1540 12/26/23 1007   12/22/23 1000  cefTRIAXone  (ROCEPHIN ) 2 g in sodium chloride  0.9 % 100 mL IVPB  Status:  Discontinued        2 g 200 mL/hr over 30 Minutes Intravenous Every 24 hours 12/21/23 1410 12/24/23 1157   12/22/23 1000  azithromycin  (ZITHROMAX ) 500 mg in sodium chloride  0.9 % 250 mL IVPB  Status:  Discontinued        500 mg 250 mL/hr over 60 Minutes Intravenous Every 24 hours 12/21/23 1410 12/24/23 1157   12/21/23 1145  cefTRIAXone  (ROCEPHIN ) 2 g in sodium chloride   0.9 % 100 mL IVPB        2 g 200 mL/hr over 30 Minutes Intravenous Once 12/21/23 1141 12/21/23 1234   12/21/23 1145  azithromycin  (ZITHROMAX ) 500 mg in sodium chloride  0.9 % 250 mL IVPB        500 mg 250 mL/hr over 60 Minutes Intravenous  Once 12/21/23 1141 12/21/23 1313       Subjective: Today, patient was seen and examined at bedside.  Patient still complains of cough congestion and shortness of breath but denies overt dyspnea or pain.  No nausea vomiting fever or chills.    Objective: Vitals:   12/29/23 0733 12/29/23 0900  BP: (!) 136/48 (!) 121/34  Pulse: 68 72  Resp: 20 17  Temp: 98.6 F (37 C) (!) 97.5 F (36.4 C)  SpO2: (!) 84% 99%    Intake/Output Summary (Last 24 hours) at 12/29/2023 0949 Last data filed at 12/29/2023 0500 Gross per 24 hour  Intake --  Output 1250 ml  Net -1250 ml  Filed Weights   12/21/23 1147  Weight: 68 kg   Body mass index is 29.29 kg/m.   Physical Exam:  General:  Average built, not in obvious distress, on 4 L of oxygen , elderly female, appears deconditioned and weak. HENT:   No scleral pallor or icterus noted. Oral mucosa is moist.  Chest: Coarse breath sounds noted bilaterally.  Decreased breath sounds CVS: S1 &S2 heard. No murmur.  Irregular rhythm Abdomen: Soft, nontender, nondistended.  Bowel sounds are heard.   Extremities: No cyanosis, clubbing or edema.  Peripheral pulses are palpable. Psych: Alert, awake and oriented, normal mood CNS:  No cranial nerve deficits.  Generalized weakness noted. Skin: Warm and dry.  No rashes noted.   Data Review: I have personally reviewed the following laboratory data and studies,  CBC: Recent Labs  Lab 12/25/23 0242 12/29/23 0449  WBC 7.5 8.3  HGB 7.4* 6.8*  HCT 24.8* 22.9*  MCV 96.9 97.9  PLT 363 376   Basic Metabolic Panel: Recent Labs  Lab 12/24/23 0121 12/25/23 0242 12/27/23 0140 12/28/23 0448 12/29/23 0449  NA 145 143 144 143 141  K 3.9 3.4* 4.0 3.8 4.1  CL 103  104 104 102 103  CO2 29 30 27 31 31   GLUCOSE 160* 100* 198* 87 77  BUN 23 21 25* 30* 37*  CREATININE 1.64* 1.62* 1.97* 2.32* 2.22*  CALCIUM  8.5* 8.4* 8.3* 8.5* 8.4*  MG  --  2.0  --   --   --   PHOS  --   --   --   --  2.9   Liver Function Tests: No results for input(s): AST, ALT, ALKPHOS, BILITOT, PROT, ALBUMIN in the last 168 hours.  No results for input(s): LIPASE, AMYLASE in the last 168 hours. No results for input(s): AMMONIA in the last 168 hours. Cardiac Enzymes: No results for input(s): CKTOTAL, CKMB, CKMBINDEX, TROPONINI in the last 168 hours. BNP (last 3 results) Recent Labs    02/13/23 2158 12/22/23 0517  BNP 361.3* 489.6*    ProBNP (last 3 results) No results for input(s): PROBNP in the last 8760 hours.  CBG: No results for input(s): GLUCAP in the last 168 hours. Recent Results (from the past 240 hours)  Resp panel by RT-PCR (RSV, Flu A&B, Covid) Anterior Nasal Swab     Status: None   Collection Time: 12/21/23 11:39 AM   Specimen: Anterior Nasal Swab  Result Value Ref Range Status   SARS Coronavirus 2 by RT PCR NEGATIVE NEGATIVE Final   Influenza A by PCR NEGATIVE NEGATIVE Final   Influenza B by PCR NEGATIVE NEGATIVE Final    Comment: (NOTE) The Xpert Xpress SARS-CoV-2/FLU/RSV plus assay is intended as an aid in the diagnosis of influenza from Nasopharyngeal swab specimens and should not be used as a sole basis for treatment. Nasal washings and aspirates are unacceptable for Xpert Xpress SARS-CoV-2/FLU/RSV testing.  Fact Sheet for Patients: bloggercourse.com  Fact Sheet for Healthcare Providers: seriousbroker.it  This test is not yet approved or cleared by the [place]  FDA and has been authorized for detection and/or diagnosis of SARS-CoV-2 by FDA under an Emergency Use Authorization (EUA). This EUA will remain in effect (meaning this test can be used) for the duration  of the COVID-19 declaration under Section 564(b)(1) of the Act, 21 U.S.C. section 360bbb-3(b)(1), unless the authorization is terminated or revoked.     Resp Syncytial Virus by PCR NEGATIVE NEGATIVE Final    Comment: (NOTE) Fact Sheet for Patients: bloggercourse.com  Fact Sheet for Healthcare Providers: seriousbroker.it  This test is not yet approved or cleared by the [place]  FDA and has been authorized for detection and/or diagnosis of SARS-CoV-2 by FDA under an Emergency Use Authorization (EUA). This EUA will remain in effect (meaning this test can be used) for the duration of the COVID-19 declaration under Section 564(b)(1) of the Act, 21 U.S.C. section 360bbb-3(b)(1), unless the authorization is terminated or revoked.  Performed at [facility], [address] [place], [name] [address]   Blood Culture (routine x 2)     Status: None   Collection Time: 12/21/23 11:39 AM   Specimen: BLOOD  Result Value Ref Range Status   Specimen Description BLOOD LEFT ANTECUBITAL  Final   Special Requests   Final    BOTTLES DRAWN AEROBIC AND ANAEROBIC Blood Culture adequate volume   Culture   Final    NO GROWTH 5 DAYS Performed at [facility], [address] [place], [name] [address]    Report Status 12/26/2023 FINAL  Final  Blood Culture (routine x 2)     Status: None   Collection Time: 12/21/23 11:44 AM   Specimen: BLOOD  Result Value Ref Range Status   Specimen Description BLOOD RIGHT ANTECUBITAL  Final   Special Requests   Final    BOTTLES DRAWN AEROBIC AND ANAEROBIC Blood Culture results may not be optimal due to an inadequate volume of blood received in culture bottles   Culture   Final    NO GROWTH 5 DAYS Performed at [facility], [address] [place], [name] [address]    Report Status 12/26/2023 FINAL  Final     Studies: DG Swallowing Func-Speech Pathology Result Date: 12/28/2023 Table  formatting from the original result was not included. Modified Barium Swallow Study Patient Details Name: Courtney Cline MRN: [id number] Date of Birth: 1933/08/01 Today's Date: 12/28/2023 HPI/PMH: HPI: 89 yo female presenting to ED with SOB and cough x2 days. CXR shows small pleural effusion with RLL atelectasis vs airspace disease, repeat CXR 11/13 showed increased infiltrate after 6 day course of abx. MBS 01/23/20 did not show penetration/aspiration but esophageal stasis, recommending regular diet with thin liquids. Seen by SLP in AIR primarily for cognitive-linguistic goals s/p CVA but with modification of her diet to Dys 1-Dys 2 due to edentulism and pt preference. PMH includes HTN, HLD, A-fib, CAD, CKD 3B, CHF, breast cancer, anemia, COPD, chronic respiratory failure on 3L, prior L thalamic CVA (10/2023) Clinical Impression: Clinical Impression: Pt presents with functional oropharyngeal swallowing. The swallow is initiated at the posterior angle of the ramus, though the head of the bolus reaches the laryngeal vestibule prior to achieving complete closure with thin liquids. Trace penetration is completely expelled with laryngeal elevation and is considered WFL (PAS 2). Otherwise, no penetration/aspiration occurs. She clears her oral cavity of bite-sized solids despite edentulism but prefers to continue current pureed diet. The barium tablet was split in half and given with purees with minimal retention in the distal esophagus. Given pt's clinical presentation with globus sensation and regurgitation in addition to more prolific retention noted on previous MBS, could still consider dedicated assessment if indicated. Education was provided regarding esophageal and aspiration precautions. Continue current diet without ongoing SLP f/u. Factors that may increase risk of adverse event in presence of aspiration Noe & Lianne 2021): Factors that may increase risk of adverse event in presence of aspiration Noe  & Lianne 2021): Respiratory or GI disease;  Reduced cognitive function Recommendations/Plan: Swallowing Evaluation Recommendations Swallowing Evaluation Recommendations Recommendations: PO diet PO Diet Recommendation: Dysphagia 1 (Pureed); Thin liquids (Level 0) Liquid Administration via: Cup; Straw Medication Administration: Crushed with puree Supervision: Full assist for feeding; Full supervision/cueing for swallowing strategies Swallowing strategies  : Minimize environmental distractions; Slow rate; Small bites/sips Postural changes: Position pt fully upright for meals; Stay upright 30-60 min after meals Oral care recommendations: Oral care BID (2x/day) Recommended consults: Consider esophageal assessment Treatment Plan Treatment Plan Treatment recommendations: Defer treatment plan to SLP at other venue (see follow-up recommendations) Follow-up recommendations: Skilled nursing-short term rehab (<3 hours/day) Functional status assessment: Patient has not had a recent decline in their functional status. Recommendations Recommendations for follow up therapy are one component of a multi-disciplinary discharge planning process, led by the attending physician.  Recommendations may be updated based on patient status, additional functional criteria and insurance authorization. Assessment: Orofacial Exam: Orofacial Exam Oral Cavity: Oral Hygiene: WFL Oral Cavity - Dentition: Edentulous Orofacial Anatomy: WFL Oral Motor/Sensory Function: WFL Anatomy: Anatomy: Suspected cervical osteophytes; Prominent cricopharyngeus Boluses Administered: Boluses Administered Boluses Administered: Thin liquids (Level 0); Mildly thick liquids (Level 2, nectar thick); Moderately thick liquids (Level 3, honey thick); Puree; Solid  Oral Impairment Domain: Oral Impairment Domain Lip Closure: No labial escape Tongue control during bolus hold: Cohesive bolus between tongue to palatal seal Bolus preparation/mastication: Timely and efficient chewing  and mashing Bolus transport/lingual motion: Brisk tongue motion Oral residue: Complete oral clearance Location of oral residue : [name] Initiation of pharyngeal swallow : Posterior angle of the ramus  Pharyngeal Impairment Domain: Pharyngeal Impairment Domain Soft palate elevation: No bolus between soft palate (SP)/pharyngeal wall (PW) Laryngeal elevation: Complete superior movement of thyroid  cartilage with complete approximation of arytenoids to epiglottic petiole Anterior hyoid excursion: Complete anterior movement Epiglottic movement: Complete inversion Laryngeal vestibule closure: Complete, no air/contrast in laryngeal vestibule Pharyngeal stripping wave : Present - complete Pharyngeal contraction (A/P view only): [name] Pharyngoesophageal segment opening: Partial distention/partial duration, partial obstruction of flow Tongue base retraction: Trace column of contrast or air between tongue base and PPW Pharyngeal residue: Trace residue within or on pharyngeal structures Location of pharyngeal residue: Valleculae; Pharyngeal wall; Pyriform sinuses  Esophageal Impairment Domain: Esophageal Impairment Domain Esophageal clearance upright position: Esophageal retention Pill: Pill Consistency administered: Puree Puree: WFL Penetration/Aspiration Scale Score: Penetration/Aspiration Scale Score 1.  Material does not enter airway: Mildly thick liquids (Level 2, nectar thick); Moderately thick liquids (Level 3, honey thick); Puree; Solid; Pill 2.  Material enters airway, remains ABOVE vocal cords then ejected out: Thin liquids (Level 0) Compensatory Strategies: Compensatory Strategies Compensatory strategies: No   General Information: Caregiver present: No  Diet Prior to this Study: Dysphagia 1 (pureed); Thin liquids (Level 0)   Temperature : Normal   Respiratory Status: WFL   Supplemental O2: Nasal cannula   History of Recent Intubation: No  Behavior/Cognition: Alert; Cooperative Self-Feeding Abilities: Able to self-feed  Baseline vocal quality/speech: Normal Volitional Cough: Able to elicit Volitional Swallow: Able to elicit Exam Limitations: No limitations Goal Planning: Prognosis for improved oropharyngeal function: Good Barriers to Reach Goals: Cognitive deficits; Time post onset No data recorded Patient/Family Stated Goal: none stated Consulted and agree with results and recommendations: Patient Pain: Pain Assessment Pain Assessment: No/denies pain Pain Score: 0 End of Session: Start Time:SLP Start Time (ACUTE ONLY): 1400 Stop Time: SLP Stop Time (ACUTE ONLY): 1430 Time Calculation:SLP Time Calculation (min) (ACUTE ONLY): 30 min Charges: SLP Evaluations $ SLP Speech Visit: 1 Visit  SLP Evaluations $BSS Swallow: 1 Procedure $MBS Swallow: 1 Procedure SLP visit diagnosis: SLP Visit Diagnosis: Dysphagia, oropharyngeal phase (R13.12) Past Medical History: Past Medical History: Diagnosis Date  Acute cystitis without hematuria   Acute metabolic encephalopathy 11/04/2023  AKI (acute kidney injury) 02/15/2021  Allergic rhinitis   Arthritis   Breast calcification seen on mammogram   Right breast  Breast cancer (HCC)   COPD (chronic obstructive pulmonary disease) (HCC)   GERD (gastroesophageal reflux disease)   Hyperglycemia   Hyperlipidemia   Hypertension   Low back pain   On home oxygen  therapy   all the time  Osteopenia   Peripheral edema   Shortness of breath   Subclavian artery stenosis, left   Vitamin D  deficiency   Wears dentures   top  Wears glasses   reading Past Surgical History: Past Surgical History: Procedure Laterality Date  BREAST BIOPSY    BREAST LUMPECTOMY    right 2015  BREAST LUMPECTOMY WITH RADIOACTIVE SEED LOCALIZATION Right 12/20/2013  Procedure: RIGHT BREAST LUMPECTOMY WITH RADIOACTIVE SEED LOCALIZATION;  Surgeon: Debby Shipper, MD;  Location:  SURGERY CENTER;  Service: General;  Laterality: Right;  CAROTID DUPLEX  2014  CATARACT EXTRACTION    both eyes Damien Blumenthal, M.A., CCC-SLP Speech Language Pathology,  Acute Rehabilitation Services Secure Chat preferred [phone] 12/28/2023, 3:55 PM  DG CHEST PORT 1 VIEW Result Date: 12/28/2023 EXAM: 1 VIEW XRAY OF THE CHEST 12/28/2023 08:49:00 AM COMPARISON: Comparison 7 days ago. CLINICAL HISTORY: Congestion of respiratory tract. FINDINGS: LUNGS AND PLEURA: Increased bibasilar opacities are noted concerning for worsening edema or infiltrates. Small bilateral pleural effusions. No pneumothorax. HEART AND MEDIASTINUM: No acute abnormality of the cardiac and mediastinal silhouettes. BONES AND SOFT TISSUES: No acute osseous abnormality. IMPRESSION: 1. Increased bibasilar opacities concerning for worsening edema or infection. 2. Small bilateral pleural effusions. Electronically signed by: Lynwood Seip MD 12/28/2023 09:13 AM EST RP Workstation: [id number]      Vernal Alstrom, MD  [facility] 12/29/2023  If 7PM-7AM, please contact night-coverage

## 2023-12-29 NOTE — Plan of Care (Signed)
  Problem: Fluid Volume: Goal: Hemodynamic stability will improve Outcome: Progressing   Problem: Clinical Measurements: Goal: Signs and symptoms of infection will decrease Outcome: Progressing   Problem: Education: Goal: Knowledge of General Education information will improve Description: Including pain rating scale, medication(s)/side effects and non-pharmacologic comfort measures Outcome: Progressing

## 2023-12-29 NOTE — Progress Notes (Signed)
 Date and time results received: 12/29/23 0628   Test: HGB Critical Value: 6.8  Name of Provider Notified: Opyd  Orders Received? Or Actions Taken?: Orders Received - See Orders for details

## 2023-12-29 NOTE — Plan of Care (Signed)
  Problem: Pain Managment: Goal: General experience of comfort will improve and/or be controlled Outcome: Adequate for Discharge

## 2023-12-29 NOTE — Progress Notes (Signed)
 Hgb is 6.8 this morning. Plan to transfuse 1 unit RBCs.

## 2023-12-29 NOTE — TOC Progression Note (Signed)
 Transition of Care [facility]) - Progression Note    Patient Details  Name: Toryn Dewalt MRN: [id number] Date of Birth: [birth year]  [facility] of [facility]) CM/SW Contact  [name], [name] Phone Number: 12/29/2023, 2:15 PM  Clinical Narrative:   CSW following for medical stability to transition to SNF. CSW to start insurance authorization once patient is medically stable. Brief update provided to [facility].    Expected Discharge Plan: Skilled Nursing Facility Barriers to Discharge: Continued Medical Work up, English As A Second Language Teacher               Expected Discharge Plan and Services       Living arrangements for the past 2 months: Single Family Home                                       Social Drivers of Health (SDOH) Interventions SDOH Screenings   Food Insecurity: No Food Insecurity (12/21/2023)  Housing: Low Risk  (12/21/2023)  Transportation Needs: No Transportation Needs (12/21/2023)  Utilities: Not At Risk (12/21/2023)  Social Connections: Socially Isolated (12/21/2023)  Tobacco Use: Medium Risk (12/21/2023)    Readmission Risk Interventions    02/15/2023    1:37 PM  Readmission Risk Prevention Plan  Transportation Screening Complete  PCP or Specialist Appt within 5-7 Days Complete  Home Care Screening Complete  Medication Review (RN CM) Complete

## 2023-12-29 NOTE — Progress Notes (Signed)
 Physical Therapy Treatment Patient Details Name: Courtney Cline MRN: [id number] DOB: [birth year] Today's Date: 12/29/2023   History of Present Illness The pt is an 89 yo female presenting 11/6 with afib with RVR. Work up concerning for sepsis, admitted for resp failure likely due to PNA. PMH includes: HTN, HLD, afib, CAD, CKD III, CHF, breast cancer, obesity, COPD on 4L O2.    PT Comments  Pt received in supine and agreeable to supine exercises, but declined EOB. Pt instructed in BUE and BLE exercises with pt able to perform without assist. Pt encouraged to perform exercises independently for strengthening. Pt continues to benefit from PT services to progress toward functional mobility goals.    If plan is discharge home, recommend the following: A lot of help with walking and/or transfers;A lot of help with bathing/dressing/bathroom;Assist for transportation;Help with stairs or ramp for entrance;Assistance with cooking/housework   Can travel by private vehicle     No  Equipment Recommendations  None recommended by PT    Recommendations for Other Services       Precautions / Restrictions Precautions Precautions: Fall Recall of Precautions/Restrictions: Impaired Precaution/Restrictions Comments: monitor HR, 4L O2 at baseline Restrictions Weight Bearing Restrictions Per Provider Order: No     Mobility  Bed Mobility               General bed mobility comments: Pt declined EOB    Transfers                        Ambulation/Gait                   Stairs             Wheelchair Mobility     Tilt Bed    Modified Rankin (Stroke Patients Only)       Balance Overall balance assessment: Needs assistance     Sitting balance - Comments: nt                                    Communication Communication Communication: No apparent difficulties  Cognition Arousal: Alert Behavior During Therapy: WFL for tasks  assessed/performed   PT - Cognitive impairments: No apparent impairments                         Following commands: Intact      Cueing Cueing Techniques: Verbal cues, Visual cues, Tactile cues  Exercises General Exercises - Upper Extremity Shoulder Flexion: AROM, Both, 5 reps General Exercises - Lower Extremity Ankle Circles/Pumps: AROM, Supine, Both, 10 reps Heel Slides: AROM, Supine, Both, 5 reps Hip ABduction/ADduction: AROM, Supine, Both, 10 reps Straight Leg Raises: AROM, Supine, Both, 10 reps    General Comments        Pertinent Vitals/Pain Pain Assessment Pain Assessment: No/denies pain     PT Goals (current goals can now be found in the care plan section) Acute Rehab PT Goals Patient Stated Goal: to gain more independence with mobility PT Goal Formulation: With patient/family Time For Goal Achievement: 01/06/24 Progress towards PT goals: Progressing toward goals    Frequency    Min 2X/week       AM-PAC PT 6 Clicks Mobility   Outcome Measure  Help needed turning from your back to your side while in a flat bed without using bedrails?: A Little Help needed moving from  lying on your back to sitting on the side of a flat bed without using bedrails?: A Little Help needed moving to and from a bed to a chair (including a wheelchair)?: A Little Help needed standing up from a chair using your arms (e.g., wheelchair or bedside chair)?: A Little Help needed to walk in hospital room?: Total Help needed climbing 3-5 steps with a railing? : Total 6 Click Score: 14    End of Session Equipment Utilized During Treatment: Oxygen  Activity Tolerance: Patient limited by fatigue Patient left: with call bell/phone within reach;in bed;with bed alarm set Nurse Communication: Mobility status PT Visit Diagnosis: Unsteadiness on feet (R26.81);Other abnormalities of gait and mobility (R26.89);Muscle weakness (generalized) (M62.81);History of falling (Z91.81)      Time: 8654-8595 PT Time Calculation (min) (ACUTE ONLY): 19 min  Charges:    $Therapeutic Exercise: 8-22 mins PT General Charges $$ ACUTE PT VISIT: 1 Visit                     Darryle George, PTA Acute Rehabilitation Services Secure Chat Preferred  [phone]    Darryle George 12/29/2023, 2:22 PM

## 2023-12-30 DIAGNOSIS — A419 Sepsis, unspecified organism: Secondary | ICD-10-CM | POA: Diagnosis not present

## 2023-12-30 DIAGNOSIS — R6520 Severe sepsis without septic shock: Secondary | ICD-10-CM | POA: Diagnosis not present

## 2023-12-30 DIAGNOSIS — J9601 Acute respiratory failure with hypoxia: Secondary | ICD-10-CM | POA: Diagnosis not present

## 2023-12-30 LAB — TYPE AND SCREEN
ABO/RH(D): A POS
Antibody Screen: NEGATIVE
Unit division: 0

## 2023-12-30 LAB — CBC
HCT: 28.1 % — ABNORMAL LOW (ref 36.0–46.0)
Hemoglobin: 8.7 g/dL — ABNORMAL LOW (ref 12.0–15.0)
MCH: 29.4 pg (ref 26.0–34.0)
MCHC: 31 g/dL (ref 30.0–36.0)
MCV: 94.9 fL (ref 80.0–100.0)
Platelets: 359 K/uL (ref 150–400)
RBC: 2.96 MIL/uL — ABNORMAL LOW (ref 3.87–5.11)
RDW: 15.9 % — ABNORMAL HIGH (ref 11.5–15.5)
WBC: 9.1 K/uL (ref 4.0–10.5)
nRBC: 0 % (ref 0.0–0.2)

## 2023-12-30 LAB — BPAM RBC
Blood Product Expiration Date: 202512052359
ISSUE DATE / TIME: 202511140854
Unit Type and Rh: 6200

## 2023-12-30 LAB — BASIC METABOLIC PANEL WITH GFR
Anion gap: 8 (ref 5–15)
BUN: 35 mg/dL — ABNORMAL HIGH (ref 8–23)
CO2: 32 mmol/L (ref 22–32)
Calcium: 8.5 mg/dL — ABNORMAL LOW (ref 8.9–10.3)
Chloride: 104 mmol/L (ref 98–111)
Creatinine, Ser: 1.89 mg/dL — ABNORMAL HIGH (ref 0.44–1.00)
GFR, Estimated: 25 mL/min — ABNORMAL LOW (ref >60)
Glucose, Bld: 95 mg/dL (ref 70–99)
Potassium: 3.7 mmol/L (ref 3.5–5.1)
Sodium: 144 mmol/L (ref 135–145)

## 2023-12-30 LAB — MAGNESIUM: Magnesium: 2.1 mg/dL (ref 1.7–2.4)

## 2023-12-30 NOTE — Plan of Care (Signed)

## 2023-12-30 NOTE — Progress Notes (Signed)
 PROGRESS NOTE  [name] FMW:[id number] DOB: 15-Nov-1933 DOA: 12/21/2023 PCP: Elliot Charm, MD   LOS: 9 days   Brief narrative:  89 y.o. female with medical history significant of hypertension, hyperlipidemia, atrial fibrillation, CAD, CKD 3B, CHF, breast cancer, obesity, anemia, COPD, chronic respiratory failure on 3 L presented hospital with worsening shortness of breath for 2 days with cough.  EMS was called in and patient was noted to have A-fib with RVR with heart rate up to 200s.  Chest x-ray showed small pleural effusion with right lower lobe atelectasis versus airspace disease.  Initially patient and spontaneously converted but subsequently had A-fib with RVR and required cardiology evaluation with subsequent Cardizem followed by amiodarone drip and now on oral amiodarone.  At this time, patient has completed course of antibiotics.  PT recommended skilled nursing facility which family initially refused and now wish to reconsider.    Assessment/Plan: Principal Problem:   Sepsis (HCC) Active Problems:   Breast cancer of upper-inner quadrant of right female breast (HCC)   Essential hypertension   Hyperlipidemia   Diastolic CHF (HCC)   Persistent atrial fibrillation (HCC)   CAP (community acquired pneumonia)   Acute on chronic respiratory failure with hypoxia (HCC)   CKD stage 3b, GFR 30-44 ml/min (HCC)   Obesity (BMI 30-39.9)   Normocytic anemia   COPD with acute exacerbation (HCC)   History of CVA (cerebrovascular accident)   Acute on chronic hypoxemic respiratory failure  Respiratory failure thought to be secondary to pneumonia as well as diastolic heart failure.  Patient is on 4 L of oxygen  at baseline and is currently on 4 .  Had increased congestion and shortness of breath so chest x-ray was repeated with worsening infiltrate but today patient feels much better.  Received 1 unit of packed RBC yesterday.   Speech and swallow evaluation done due to history of  esophageal dysphagia.  Currently on dysphagia 1 diet which she is on at home.  Will need to continue aspiration precautions.  Afebrile at this time.  Completed 6-day course of antibiotic.  Mild hypokalemia.  Was replenished.  Latest potassium of 3.7   Severe sepsis due to pneumonia Sepsis physiology has resolved.  Patient initially had elevated lactic more than 2 with pneumonia and fever and leukocytosis on presentation suggestive of severe sepsis.  Completed course of antibiotic.  Currently on 4 L of oxygen  by nasal cannula .  Continue incentive spirometry.  Aspiration precautions.    Atrial fibrillation with RVR Rate controlled at this time.  Continue amiodarone and metoprolol .  Plan is to follow-up with cardiology as outpatient.    Essential hypertension Continue metoprolol .  Blood pressure stable.  Latest blood pressure from 122/49   Hyperlipidemia Continue Lipitor    History of CVA. Continue Eliquis  and Lipitor .   AKI on CKD 3B Improving.  Latest creatinine of 1.8 from 2.2< 2.3 <1.9<1.6.  On oral hydration.  Received 1 unit of packed RBC on 12/30/2023.    Monitor BMP in AM.    Acute on chronic diastolic CHF 2D echocardiogram from September 2025 showed LV ejection fraction of 60 to 65% with grade 2 diastolic dysfunction.  Patient has responded well with IV Lasix .  BNP elevated at 489.  IV Lasix  had been switched to 40 mg orally daily but due to rising creatinine, currently on hold.  Will hold for 1 more day.  Patient is overall negative balance for 5868 mL.  Continue strict intake and output charting.  Feels much better after  transfusion of 1 unit yesterday.   History of breast cancer. No acute issues at this time  Anemia Latest hemoglobin of 8.7 from 6.8 yesterday.  1 unit of packed RBC 12/29/2023.  Feels much better today.   COPD Continue bronchodilators , incentive spirometry deep breathing.  On baseline oxygen  requirement at this time.  Debility deconditioning.  Plan for  skilled nursing facility.  DVT prophylaxis: apixaban  (ELIQUIS ) tablet 2.5 mg Start: 12/21/23 2200 apixaban  (ELIQUIS ) tablet 2.5 mg   Disposition: Skilled nursing facility likely in 1 to 2 days  Status is: Inpatient Remains inpatient appropriate because: Improving clinical status, need for skilled nursing facility placement    Code Status:     Code Status: Limited: Do not attempt resuscitation (DNR) -DNR-LIMITED -Do Not Intubate/DNI   Family Communication: Spoke with the patient's son at bedside on 12/28/2023  Consultants: Cardiology  Procedures: None  Anti-infectives:  None currently.  Anti-infectives (From admission, onward)    Start     Dose/Rate Route Frequency Ordered Stop   12/25/23 1000  azithromycin  (ZITHROMAX ) tablet 500 mg  Status:  Discontinued        500 mg Oral Daily 12/24/23 1157 12/26/23 1007   12/25/23 0800  cefUROXime (CEFTIN) tablet 500 mg  Status:  Discontinued        500 mg Oral 2 times daily with meals 12/24/23 1158 12/24/23 1540   12/25/23 0800  cefUROXime (CEFTIN) tablet 250 mg  Status:  Discontinued        250 mg Oral 2 times daily with meals 12/24/23 1540 12/26/23 1007   12/22/23 1000  cefTRIAXone  (ROCEPHIN ) 2 g in sodium chloride  0.9 % 100 mL IVPB  Status:  Discontinued        2 g 200 mL/hr over 30 Minutes Intravenous Every 24 hours 12/21/23 1410 12/24/23 1157   12/22/23 1000  azithromycin  (ZITHROMAX ) 500 mg in sodium chloride  0.9 % 250 mL IVPB  Status:  Discontinued        500 mg 250 mL/hr over 60 Minutes Intravenous Every 24 hours 12/21/23 1410 12/24/23 1157   12/21/23 1145  cefTRIAXone  (ROCEPHIN ) 2 g in sodium chloride  0.9 % 100 mL IVPB        2 g 200 mL/hr over 30 Minutes Intravenous Once 12/21/23 1141 12/21/23 1234   12/21/23 1145  azithromycin  (ZITHROMAX ) 500 mg in sodium chloride  0.9 % 250 mL IVPB        500 mg 250 mL/hr over 60 Minutes Intravenous  Once 12/21/23 1141 12/21/23 1313       Subjective: Today, patient was seen and  examined at bedside.  Patient states that she feels better with breathing today has mild cough but no sputum production.  Denies any pain, fever, chills or rigor.    Objective: Vitals:   12/30/23 0809 12/30/23 0834  BP: (!) 122/49   Pulse: 65   Resp: 16   Temp: 98.6 F (37 C)   SpO2: 99% 99%    Intake/Output Summary (Last 24 hours) at 12/30/2023 1035 Last data filed at 12/30/2023 0901 Gross per 24 hour  Intake 415.17 ml  Output --  Net 415.17 ml    Filed Weights   12/21/23 1147  Weight: 68 kg   Body mass index is 29.29 kg/m.   Physical Exam:  General:  Average built, not in obvious distress, on 4 L of oxygen , elderly female, appears deconditioned and weak. HENT:   No scleral pallor or icterus noted. Oral mucosa is moist.  Chest: Coarse breath  sounds noted. CVS: S1 &S2 heard. No murmur.  Irregular rhythm Abdomen: Soft, nontender, nondistended.  Bowel sounds are heard.   Extremities: No cyanosis, clubbing or edema.  Peripheral pulses are palpable. Psych: Alert, awake and oriented, normal mood CNS:  No cranial nerve deficits.  Generalized weakness noted. Skin: Warm and dry.  No rashes noted.   Data Review: I have personally reviewed the following laboratory data and studies,  CBC: Recent Labs  Lab 12/25/23 0242 12/29/23 0449 12/29/23 1334 12/30/23 0500  WBC 7.5 8.3  --  9.1  HGB 7.4* 6.8* 8.7* 8.7*  HCT 24.8* 22.9* 28.0* 28.1*  MCV 96.9 97.9  --  94.9  PLT 363 376  --  359   Basic Metabolic Panel: Recent Labs  Lab 12/25/23 0242 12/27/23 0140 12/28/23 0448 12/29/23 0449 12/30/23 0500  NA 143 144 143 141 144  K 3.4* 4.0 3.8 4.1 3.7  CL 104 104 102 103 104  CO2 30 27 31 31  32  GLUCOSE 100* 198* 87 77 95  BUN 21 25* 30* 37* 35*  CREATININE 1.62* 1.97* 2.32* 2.22* 1.89*  CALCIUM  8.4* 8.3* 8.5* 8.4* 8.5*  MG 2.0  --   --   --  2.1  PHOS  --   --   --  2.9  --    Liver Function Tests: No results for input(s): AST, ALT, ALKPHOS, BILITOT,  PROT, ALBUMIN in the last 168 hours.  No results for input(s): LIPASE, AMYLASE in the last 168 hours. No results for input(s): AMMONIA in the last 168 hours. Cardiac Enzymes: No results for input(s): CKTOTAL, CKMB, CKMBINDEX, TROPONINI in the last 168 hours. BNP (last 3 results) Recent Labs    02/13/23 2158 12/22/23 0517  BNP 361.3* 489.6*    ProBNP (last 3 results) No results for input(s): PROBNP in the last 8760 hours.  CBG: No results for input(s): GLUCAP in the last 168 hours. Recent Results (from the past 240 hours)  Resp panel by RT-PCR (RSV, Flu A&B, Covid) Anterior Nasal Swab     Status: None   Collection Time: 12/21/23 11:39 AM   Specimen: Anterior Nasal Swab  Result Value Ref Range Status   SARS Coronavirus 2 by RT PCR NEGATIVE NEGATIVE Final   Influenza A by PCR NEGATIVE NEGATIVE Final   Influenza B by PCR NEGATIVE NEGATIVE Final    Comment: (NOTE) The Xpert Xpress SARS-CoV-2/FLU/RSV plus assay is intended as an aid in the diagnosis of influenza from Nasopharyngeal swab specimens and should not be used as a sole basis for treatment. Nasal washings and aspirates are unacceptable for Xpert Xpress SARS-CoV-2/FLU/RSV testing.  Fact Sheet for Patients: bloggercourse.com  Fact Sheet for Healthcare Providers: seriousbroker.it  This test is not yet approved or cleared by the [place]  FDA and has been authorized for detection and/or diagnosis of SARS-CoV-2 by FDA under an Emergency Use Authorization (EUA). This EUA will remain in effect (meaning this test can be used) for the duration of the COVID-19 declaration under Section 564(b)(1) of the Act, 21 U.S.C. section 360bbb-3(b)(1), unless the authorization is terminated or revoked.     Resp Syncytial Virus by PCR NEGATIVE NEGATIVE Final    Comment: (NOTE) Fact Sheet for Patients: bloggercourse.com  Fact Sheet for  Healthcare Providers: seriousbroker.it  This test is not yet approved or cleared by the [place]  FDA and has been authorized for detection and/or diagnosis of SARS-CoV-2 by FDA under an Emergency Use Authorization (EUA). This EUA will remain in effect (meaning this  test can be used) for the duration of the COVID-19 declaration under Section 564(b)(1) of the Act, 21 U.S.C. section 360bbb-3(b)(1), unless the authorization is terminated or revoked.  Performed at [facility], [address], [place], [name] [address]   Blood Culture (routine x 2)     Status: None   Collection Time: 12/21/23 11:39 AM   Specimen: BLOOD  Result Value Ref Range Status   Specimen Description BLOOD LEFT ANTECUBITAL  Final   Special Requests   Final    BOTTLES DRAWN AEROBIC AND ANAEROBIC Blood Culture adequate volume   Culture   Final    NO GROWTH 5 DAYS Performed at [facility], [address] [place], [name] [address]    Report Status 12/26/2023 FINAL  Final  Blood Culture (routine x 2)     Status: None   Collection Time: 12/21/23 11:44 AM   Specimen: BLOOD  Result Value Ref Range Status   Specimen Description BLOOD RIGHT ANTECUBITAL  Final   Special Requests   Final    BOTTLES DRAWN AEROBIC AND ANAEROBIC Blood Culture results may not be optimal due to an inadequate volume of blood received in culture bottles   Culture   Final    NO GROWTH 5 DAYS Performed at [facility], [address] [place], [name] [address]    Report Status 12/26/2023 FINAL  Final     Studies: DG Swallowing Func-Speech Pathology Result Date: 12/28/2023 Table formatting from the original result was not included. Modified Barium Swallow Study Patient Details Name: Courtney Cline MRN: [id number] Date of Birth: 10/20/1933 Today's Date: 12/28/2023 HPI/PMH: HPI: 89 yo female presenting to ED with SOB and cough x2 days. CXR shows small pleural effusion with RLL atelectasis  vs airspace disease, repeat CXR 11/13 showed increased infiltrate after 6 day course of abx. MBS 01/23/20 did not show penetration/aspiration but esophageal stasis, recommending regular diet with thin liquids. Seen by SLP in AIR primarily for cognitive-linguistic goals s/p CVA but with modification of her diet to Dys 1-Dys 2 due to edentulism and pt preference. PMH includes HTN, HLD, A-fib, CAD, CKD 3B, CHF, breast cancer, anemia, COPD, chronic respiratory failure on 3L, prior L thalamic CVA (10/2023) Clinical Impression: Clinical Impression: Pt presents with functional oropharyngeal swallowing. The swallow is initiated at the posterior angle of the ramus, though the head of the bolus reaches the laryngeal vestibule prior to achieving complete closure with thin liquids. Trace penetration is completely expelled with laryngeal elevation and is considered WFL (PAS 2). Otherwise, no penetration/aspiration occurs. She clears her oral cavity of bite-sized solids despite edentulism but prefers to continue current pureed diet. The barium tablet was split in half and given with purees with minimal retention in the distal esophagus. Given pt's clinical presentation with globus sensation and regurgitation in addition to more prolific retention noted on previous MBS, could still consider dedicated assessment if indicated. Education was provided regarding esophageal and aspiration precautions. Continue current diet without ongoing SLP f/u. Factors that may increase risk of adverse event in presence of aspiration Noe & Lianne 2021): Factors that may increase risk of adverse event in presence of aspiration Noe & Lianne 2021): Respiratory or GI disease; Reduced cognitive function Recommendations/Plan: Swallowing Evaluation Recommendations Swallowing Evaluation Recommendations Recommendations: PO diet PO Diet Recommendation: Dysphagia 1 (Pureed); Thin liquids (Level 0) Liquid Administration via: Cup; Straw Medication  Administration: Crushed with puree Supervision: Full assist for feeding; Full supervision/cueing for swallowing strategies Swallowing strategies  :  Minimize environmental distractions; Slow rate; Small bites/sips Postural changes: Position pt fully upright for meals; Stay upright 30-60 min after meals Oral care recommendations: Oral care BID (2x/day) Recommended consults: Consider esophageal assessment Treatment Plan Treatment Plan Treatment recommendations: Defer treatment plan to SLP at other venue (see follow-up recommendations) Follow-up recommendations: Skilled nursing-short term rehab (<3 hours/day) Functional status assessment: Patient has not had a recent decline in their functional status. Recommendations Recommendations for follow up therapy are one component of a multi-disciplinary discharge planning process, led by the attending physician.  Recommendations may be updated based on patient status, additional functional criteria and insurance authorization. Assessment: Orofacial Exam: Orofacial Exam Oral Cavity: Oral Hygiene: WFL Oral Cavity - Dentition: Edentulous Orofacial Anatomy: WFL Oral Motor/Sensory Function: WFL Anatomy: Anatomy: Suspected cervical osteophytes; Prominent cricopharyngeus Boluses Administered: Boluses Administered Boluses Administered: Thin liquids (Level 0); Mildly thick liquids (Level 2, nectar thick); Moderately thick liquids (Level 3, honey thick); Puree; Solid  Oral Impairment Domain: Oral Impairment Domain Lip Closure: No labial escape Tongue control during bolus hold: Cohesive bolus between tongue to palatal seal Bolus preparation/mastication: Timely and efficient chewing and mashing Bolus transport/lingual motion: Brisk tongue motion Oral residue: Complete oral clearance Location of oral residue : [name] Initiation of pharyngeal swallow : Posterior angle of the ramus  Pharyngeal Impairment Domain: Pharyngeal Impairment Domain Soft palate elevation: No bolus between soft palate  (SP)/pharyngeal wall (PW) Laryngeal elevation: Complete superior movement of thyroid  cartilage with complete approximation of arytenoids to epiglottic petiole Anterior hyoid excursion: Complete anterior movement Epiglottic movement: Complete inversion Laryngeal vestibule closure: Complete, no air/contrast in laryngeal vestibule Pharyngeal stripping wave : Present - complete Pharyngeal contraction (A/P view only): [name] Pharyngoesophageal segment opening: Partial distention/partial duration, partial obstruction of flow Tongue base retraction: Trace column of contrast or air between tongue base and PPW Pharyngeal residue: Trace residue within or on pharyngeal structures Location of pharyngeal residue: Valleculae; Pharyngeal wall; Pyriform sinuses  Esophageal Impairment Domain: Esophageal Impairment Domain Esophageal clearance upright position: Esophageal retention Pill: Pill Consistency administered: Puree Puree: WFL Penetration/Aspiration Scale Score: Penetration/Aspiration Scale Score 1.  Material does not enter airway: Mildly thick liquids (Level 2, nectar thick); Moderately thick liquids (Level 3, honey thick); Puree; Solid; Pill 2.  Material enters airway, remains ABOVE vocal cords then ejected out: Thin liquids (Level 0) Compensatory Strategies: Compensatory Strategies Compensatory strategies: No   General Information: Caregiver present: No  Diet Prior to this Study: Dysphagia 1 (pureed); Thin liquids (Level 0)   Temperature : Normal   Respiratory Status: WFL   Supplemental O2: Nasal cannula   History of Recent Intubation: No  Behavior/Cognition: Alert; Cooperative Self-Feeding Abilities: Able to self-feed Baseline vocal quality/speech: Normal Volitional Cough: Able to elicit Volitional Swallow: Able to elicit Exam Limitations: No limitations Goal Planning: Prognosis for improved oropharyngeal function: Good Barriers to Reach Goals: Cognitive deficits; Time post onset No data recorded Patient/Family Stated Goal:  none stated Consulted and agree with results and recommendations: Patient Pain: Pain Assessment Pain Assessment: No/denies pain Pain Score: 0 End of Session: Start Time:SLP Start Time (ACUTE ONLY): 1400 Stop Time: SLP Stop Time (ACUTE ONLY): 1430 Time Calculation:SLP Time Calculation (min) (ACUTE ONLY): 30 min Charges: SLP Evaluations $ SLP Speech Visit: 1 Visit SLP Evaluations $BSS Swallow: 1 Procedure $MBS Swallow: 1 Procedure SLP visit diagnosis: SLP Visit Diagnosis: Dysphagia, oropharyngeal phase (R13.12) Past Medical History: Past Medical History: Diagnosis Date  Acute cystitis without hematuria   Acute metabolic encephalopathy 11/04/2023  AKI (acute kidney injury) 02/15/2021  Allergic  rhinitis   Arthritis   Breast calcification seen on mammogram   Right breast  Breast cancer (HCC)   COPD (chronic obstructive pulmonary disease) (HCC)   GERD (gastroesophageal reflux disease)   Hyperglycemia   Hyperlipidemia   Hypertension   Low back pain   On home oxygen  therapy   all the time  Osteopenia   Peripheral edema   Shortness of breath   Subclavian artery stenosis, left   Vitamin D  deficiency   Wears dentures   top  Wears glasses   reading Past Surgical History: Past Surgical History: Procedure Laterality Date  BREAST BIOPSY    BREAST LUMPECTOMY    right 2015  BREAST LUMPECTOMY WITH RADIOACTIVE SEED LOCALIZATION Right 12/20/2013  Procedure: RIGHT BREAST LUMPECTOMY WITH RADIOACTIVE SEED LOCALIZATION;  Surgeon: Debby Shipper, MD;  Location: [facility];  Service: General;  Laterality: Right;  CAROTID DUPLEX  2014  CATARACT EXTRACTION    both eyes Damien Blumenthal, M.A., CCC-SLP Speech Language Pathology, Acute Rehabilitation Services Secure Chat preferred [phone] 12/28/2023, 3:55 PM     Vernal Alstrom, MD  [facility] 12/30/2023  If 7PM-7AM, please contact night-coverage

## 2023-12-31 DIAGNOSIS — A419 Sepsis, unspecified organism: Secondary | ICD-10-CM | POA: Diagnosis not present

## 2023-12-31 DIAGNOSIS — R6520 Severe sepsis without septic shock: Secondary | ICD-10-CM | POA: Diagnosis not present

## 2023-12-31 DIAGNOSIS — J9601 Acute respiratory failure with hypoxia: Secondary | ICD-10-CM | POA: Diagnosis not present

## 2023-12-31 LAB — BASIC METABOLIC PANEL WITH GFR
Anion gap: 13 (ref 5–15)
BUN: 37 mg/dL — ABNORMAL HIGH (ref 8–23)
CO2: 26 mmol/L (ref 22–32)
Calcium: 8.3 mg/dL — ABNORMAL LOW (ref 8.9–10.3)
Chloride: 103 mmol/L (ref 98–111)
Creatinine, Ser: 1.73 mg/dL — ABNORMAL HIGH (ref 0.44–1.00)
GFR, Estimated: 28 mL/min — ABNORMAL LOW (ref >60)
Glucose, Bld: 93 mg/dL (ref 70–99)
Potassium: 4.1 mmol/L (ref 3.5–5.1)
Sodium: 142 mmol/L (ref 135–145)

## 2023-12-31 LAB — CBC
HCT: 27.7 % — ABNORMAL LOW (ref 36.0–46.0)
Hemoglobin: 8.5 g/dL — ABNORMAL LOW (ref 12.0–15.0)
MCH: 29.2 pg (ref 26.0–34.0)
MCHC: 30.7 g/dL (ref 30.0–36.0)
MCV: 95.2 fL (ref 80.0–100.0)
Platelets: 328 K/uL (ref 150–400)
RBC: 2.91 MIL/uL — ABNORMAL LOW (ref 3.87–5.11)
RDW: 15.6 % — ABNORMAL HIGH (ref 11.5–15.5)
WBC: 12.4 K/uL — ABNORMAL HIGH (ref 4.0–10.5)
nRBC: 0 % (ref 0.0–0.2)

## 2023-12-31 LAB — MAGNESIUM: Magnesium: 1.9 mg/dL (ref 1.7–2.4)

## 2023-12-31 NOTE — Plan of Care (Signed)

## 2023-12-31 NOTE — Progress Notes (Signed)
 PROGRESS NOTE  Courtney Cline FMW:[id number] DOB: 1933-06-13 DOA: 12/21/2023 PCP: Elliot Charm, MD   LOS: 10 days   Brief narrative:  89 y.o. female with medical history significant of hypertension, hyperlipidemia, atrial fibrillation, CAD, CKD 3B, CHF, breast cancer, obesity, anemia, COPD, chronic respiratory failure on 3 L presented hospital with worsening shortness of breath for 2 days with cough.  EMS was called in and patient was noted to have A-fib with RVR with heart rate up to 200s.  Chest x-ray showed small pleural effusion with right lower lobe atelectasis versus airspace disease.  Initially patient and spontaneously converted but subsequently had A-fib with RVR and required cardiology evaluation with subsequent Cardizem followed by amiodarone drip and now on oral amiodarone.  At this time, patient has completed course of antibiotics.  PT recommended skilled nursing facility which family initially refused and now wish to reconsider.    Assessment/Plan: Principal Problem:   Sepsis (HCC) Active Problems:   Breast cancer of upper-inner quadrant of right female breast (HCC)   Essential hypertension   Hyperlipidemia   Diastolic CHF (HCC)   Persistent atrial fibrillation (HCC)   CAP (community acquired pneumonia)   Acute on chronic respiratory failure with hypoxia (HCC)   CKD stage 3b, GFR 30-44 ml/min (HCC)   Obesity (BMI 30-39.9)   Normocytic anemia   COPD with acute exacerbation (HCC)   History of CVA (cerebrovascular accident)   Acute on chronic hypoxemic respiratory failure  Respiratory failure thought to be secondary to pneumonia as well as diastolic heart failure.  Patient is on 4 L of oxygen  at baseline and is currently on 4 liters per minute.  Overall improved at this time.  Speech and swallow evaluation done due to history of esophageal dysphagia.  Currently on dysphagia 1 diet which she is on at home.  Spoke about aspiration precautions and precautions  while eating.  Will need to continue aspiration precautions.  Afebrile at this time.  Completed 6-day course of antibiotic.  Hypokalemia.  Replenished and improved.   Severe sepsis due to pneumonia Sepsis physiology has resolved.  Pneumonia likely from aspiration due to esophageal issue.  Patient initially had elevated lactic more than 2 with pneumonia and fever and leukocytosis on presentation suggestive of severe sepsis.  Completed course of antibiotic.  Currently on 4 L of oxygen  by nasal cannula .Continue incentive spirometry.  Aspiration precautions.    Atrial fibrillation with RVR Rate controlled at this time.  Continue amiodarone and metoprolol .  Plan is to follow-up with cardiology as outpatient.    Essential hypertension Continue metoprolol .  Blood pressure stable.    Hyperlipidemia Continue Lipitor    History of CVA. Continue Eliquis  and Lipitor .   AKI on CKD 3B Improving.  Latest creatinine of 1.7 from 1.8< 2.2< 2.3 <1.9<1.6.  On oral hydration.  Received 1 unit of packed RBC on 12/30/2023.    Monitor BMP in AM.  Creatinine likely at baseline at this time.  Acute on chronic diastolic CHF 2D echocardiogram from September 2025 showed LV ejection fraction of 60 to 65% with grade 2 diastolic dysfunction.  Initially received IV Lasix  with good diuresis and was changed to oral Lasix .  Lasix  currently on hold due to AKI.  BNP elevated at 489.  Patient is overall negative balance for 5548 mL.  Continue strict intake and output charting.  Feels much better after transfusion of 1 unit yesterday.  Will reconsider addition of oral Lasix  from tomorrow   History of breast cancer. No acute  issues at this time  Anemia Received 1 unit of packed RBC for hemoglobin of 6.8.  Hemoglobin today at 8.5 1 unit of packed RBC 12/29/2023.     COPD Continue bronchodilators , incentive spirometry deep breathing.  On baseline oxygen  requirement at this time.  Debility deconditioning.  Plan for skilled  nursing facility.  DVT prophylaxis: apixaban  (ELIQUIS ) tablet 2.5 mg Start: 12/21/23 2200 apixaban  (ELIQUIS ) tablet 2.5 mg   Disposition: Skilled nursing facility likely in 1 to 2 days.  Patient is medically stable at this time.  Status is: Inpatient Remains inpatient appropriate because: , need for skilled nursing facility placement    Code Status:     Code Status: Limited: Do not attempt resuscitation (DNR) -DNR-LIMITED -Do Not Intubate/DNI   Family Communication: Spoke with the patient's son at bedside on 12/31/2023  Consultants: Cardiology  Procedures: None  Anti-infectives:  None currently.  Anti-infectives (From admission, onward)    Start     Dose/Rate Route Frequency Ordered Stop   12/25/23 1000  azithromycin  (ZITHROMAX ) tablet 500 mg  Status:  Discontinued        500 mg Oral Daily 12/24/23 1157 12/26/23 1007   12/25/23 0800  cefUROXime (CEFTIN) tablet 500 mg  Status:  Discontinued        500 mg Oral 2 times daily with meals 12/24/23 1158 12/24/23 1540   12/25/23 0800  cefUROXime (CEFTIN) tablet 250 mg  Status:  Discontinued        250 mg Oral 2 times daily with meals 12/24/23 1540 12/26/23 1007   12/22/23 1000  cefTRIAXone  (ROCEPHIN ) 2 g in sodium chloride  0.9 % 100 mL IVPB  Status:  Discontinued        2 g 200 mL/hr over 30 Minutes Intravenous Every 24 hours 12/21/23 1410 12/24/23 1157   12/22/23 1000  azithromycin  (ZITHROMAX ) 500 mg in sodium chloride  0.9 % 250 mL IVPB  Status:  Discontinued        500 mg 250 mL/hr over 60 Minutes Intravenous Every 24 hours 12/21/23 1410 12/24/23 1157   12/21/23 1145  cefTRIAXone  (ROCEPHIN ) 2 g in sodium chloride  0.9 % 100 mL IVPB        2 g 200 mL/hr over 30 Minutes Intravenous Once 12/21/23 1141 12/21/23 1234   12/21/23 1145  azithromycin  (ZITHROMAX ) 500 mg in sodium chloride  0.9 % 250 mL IVPB        500 mg 250 mL/hr over 60 Minutes Intravenous  Once 12/21/23 1141 12/21/23 1313       Subjective: Today, patient was seen  and examined at bedside.  Patient states that she has intermittent some shortness of breath but overall feels better.  Denies any pain, nausea, vomiting or sputum production.  Has mild cough.  Patient's son at bedside  Objective: Vitals:   12/31/23 0830 12/31/23 0909  BP:  127/67  Pulse:  70  Resp:  20  Temp:  98.5 F (36.9 C)  SpO2: 97% 98%    Intake/Output Summary (Last 24 hours) at 12/31/2023 1107 Last data filed at 12/31/2023 0500 Gross per 24 hour  Intake 320 ml  Output --  Net 320 ml    Filed Weights   12/21/23 1147  Weight: 68 kg   Body mass index is 29.29 kg/m.   Physical Exam:  General:  Average built, not in obvious distress, elderly female, appears deconditioned, on 4 L of oxygen  HENT:   No scleral pallor or icterus noted. Oral mucosa is moist.  Chest: Minich breath  sounds bilaterally.  Coarse breath sounds noted. CVS: S1 &S2 heard. No murmur.  Irregular rhythm but controlled Abdomen: Soft, nontender, nondistended.  Bowel sounds are heard.   Extremities: No cyanosis, clubbing or edema.  Peripheral pulses are palpable. Psych: Alert, awake and oriented, normal mood CNS:  No cranial nerve deficits.  Generalized weakness noted. Skin: Warm and dry.  No rashes noted.    Data Review: I have personally reviewed the following laboratory data and studies,  CBC: Recent Labs  Lab 12/25/23 0242 12/29/23 0449 12/29/23 1334 12/30/23 0500 12/31/23 0306  WBC 7.5 8.3  --  9.1 12.4*  HGB 7.4* 6.8* 8.7* 8.7* 8.5*  HCT 24.8* 22.9* 28.0* 28.1* 27.7*  MCV 96.9 97.9  --  94.9 95.2  PLT 363 376  --  359 328   Basic Metabolic Panel: Recent Labs  Lab 12/25/23 0242 12/27/23 0140 12/28/23 0448 12/29/23 0449 12/30/23 0500 12/31/23 0306  NA 143 144 143 141 144 142  K 3.4* 4.0 3.8 4.1 3.7 4.1  CL 104 104 102 103 104 103  CO2 30 27 31 31  32 26  GLUCOSE 100* 198* 87 77 95 93  BUN 21 25* 30* 37* 35* 37*  CREATININE 1.62* 1.97* 2.32* 2.22* 1.89* 1.73*  CALCIUM  8.4*  8.3* 8.5* 8.4* 8.5* 8.3*  MG 2.0  --   --   --  2.1 1.9  PHOS  --   --   --  2.9  --   --    Liver Function Tests: No results for input(s): AST, ALT, ALKPHOS, BILITOT, PROT, ALBUMIN in the last 168 hours.  No results for input(s): LIPASE, AMYLASE in the last 168 hours. No results for input(s): AMMONIA in the last 168 hours. Cardiac Enzymes: No results for input(s): CKTOTAL, CKMB, CKMBINDEX, TROPONINI in the last 168 hours. BNP (last 3 results) Recent Labs    02/13/23 2158 12/22/23 0517  BNP 361.3* 489.6*    ProBNP (last 3 results) No results for input(s): PROBNP in the last 8760 hours.  CBG: No results for input(s): GLUCAP in the last 168 hours. Recent Results (from the past 240 hours)  Resp panel by RT-PCR (RSV, Flu A&B, Covid) Anterior Nasal Swab     Status: None   Collection Time: 12/21/23 11:39 AM   Specimen: Anterior Nasal Swab  Result Value Ref Range Status   SARS Coronavirus 2 by RT PCR NEGATIVE NEGATIVE Final   Influenza A by PCR NEGATIVE NEGATIVE Final   Influenza B by PCR NEGATIVE NEGATIVE Final    Comment: (NOTE) The Xpert Xpress SARS-CoV-2/FLU/RSV plus assay is intended as an aid in the diagnosis of influenza from Nasopharyngeal swab specimens and should not be used as a sole basis for treatment. Nasal washings and aspirates are unacceptable for Xpert Xpress SARS-CoV-2/FLU/RSV testing.  Fact Sheet for Patients: bloggercourse.com  Fact Sheet for Healthcare Providers: seriousbroker.it  This test is not yet approved or cleared by the [place]  FDA and has been authorized for detection and/or diagnosis of SARS-CoV-2 by FDA under an Emergency Use Authorization (EUA). This EUA will remain in effect (meaning this test can be used) for the duration of the COVID-19 declaration under Section 564(b)(1) of the Act, 21 U.S.C. section 360bbb-3(b)(1), unless the authorization is  terminated or revoked.     Resp Syncytial Virus by PCR NEGATIVE NEGATIVE Final    Comment: (NOTE) Fact Sheet for Patients: bloggercourse.com  Fact Sheet for Healthcare Providers: seriousbroker.it  This test is not yet approved or cleared by the  [place]  FDA and has been authorized for detection and/or diagnosis of SARS-CoV-2 by FDA under an Emergency Use Authorization (EUA). This EUA will remain in effect (meaning this test can be used) for the duration of the COVID-19 declaration under Section 564(b)(1) of the Act, 21 U.S.C. section 360bbb-3(b)(1), unless the authorization is terminated or revoked.  Performed at [facility], [address] [place], [name] [address]   Blood Culture (routine x 2)     Status: None   Collection Time: 12/21/23 11:39 AM   Specimen: BLOOD  Result Value Ref Range Status   Specimen Description BLOOD LEFT ANTECUBITAL  Final   Special Requests   Final    BOTTLES DRAWN AEROBIC AND ANAEROBIC Blood Culture adequate volume   Culture   Final    NO GROWTH 5 DAYS Performed at [facility], [address] [place], [name] [address]    Report Status 12/26/2023 FINAL  Final  Blood Culture (routine x 2)     Status: None   Collection Time: 12/21/23 11:44 AM   Specimen: BLOOD  Result Value Ref Range Status   Specimen Description BLOOD RIGHT ANTECUBITAL  Final   Special Requests   Final    BOTTLES DRAWN AEROBIC AND ANAEROBIC Blood Culture results may not be optimal due to an inadequate volume of blood received in culture bottles   Culture   Final    NO GROWTH 5 DAYS Performed at [facility], [address], [place], [name] [address]    Report Status 12/26/2023 FINAL  Final     Studies: No results found.    Vernal Alstrom, MD  [facility] 12/31/2023  If 7PM-7AM, please contact night-coverage

## 2024-01-01 DIAGNOSIS — R6520 Severe sepsis without septic shock: Secondary | ICD-10-CM | POA: Diagnosis not present

## 2024-01-01 DIAGNOSIS — M6281 Muscle weakness (generalized): Secondary | ICD-10-CM | POA: Diagnosis not present

## 2024-01-01 DIAGNOSIS — Z743 Need for continuous supervision: Secondary | ICD-10-CM | POA: Diagnosis not present

## 2024-01-01 DIAGNOSIS — Z8673 Personal history of transient ischemic attack (TIA), and cerebral infarction without residual deficits: Secondary | ICD-10-CM | POA: Diagnosis not present

## 2024-01-01 DIAGNOSIS — R1312 Dysphagia, oropharyngeal phase: Secondary | ICD-10-CM | POA: Diagnosis not present

## 2024-01-01 DIAGNOSIS — J449 Chronic obstructive pulmonary disease, unspecified: Secondary | ICD-10-CM | POA: Diagnosis not present

## 2024-01-01 DIAGNOSIS — R2681 Unsteadiness on feet: Secondary | ICD-10-CM | POA: Diagnosis not present

## 2024-01-01 DIAGNOSIS — E440 Moderate protein-calorie malnutrition: Secondary | ICD-10-CM | POA: Diagnosis not present

## 2024-01-01 DIAGNOSIS — I5030 Unspecified diastolic (congestive) heart failure: Secondary | ICD-10-CM | POA: Diagnosis not present

## 2024-01-01 DIAGNOSIS — I959 Hypotension, unspecified: Secondary | ICD-10-CM | POA: Diagnosis not present

## 2024-01-01 DIAGNOSIS — J9601 Acute respiratory failure with hypoxia: Secondary | ICD-10-CM | POA: Diagnosis not present

## 2024-01-01 DIAGNOSIS — I4891 Unspecified atrial fibrillation: Secondary | ICD-10-CM | POA: Diagnosis not present

## 2024-01-01 DIAGNOSIS — J9611 Chronic respiratory failure with hypoxia: Secondary | ICD-10-CM | POA: Diagnosis not present

## 2024-01-01 DIAGNOSIS — Z741 Need for assistance with personal care: Secondary | ICD-10-CM | POA: Diagnosis not present

## 2024-01-01 DIAGNOSIS — N1832 Chronic kidney disease, stage 3b: Secondary | ICD-10-CM | POA: Diagnosis not present

## 2024-01-01 DIAGNOSIS — E46 Unspecified protein-calorie malnutrition: Secondary | ICD-10-CM | POA: Diagnosis not present

## 2024-01-01 DIAGNOSIS — B370 Candidal stomatitis: Secondary | ICD-10-CM | POA: Diagnosis not present

## 2024-01-01 DIAGNOSIS — F5104 Psychophysiologic insomnia: Secondary | ICD-10-CM | POA: Diagnosis not present

## 2024-01-01 DIAGNOSIS — F4323 Adjustment disorder with mixed anxiety and depressed mood: Secondary | ICD-10-CM | POA: Diagnosis not present

## 2024-01-01 DIAGNOSIS — K219 Gastro-esophageal reflux disease without esophagitis: Secondary | ICD-10-CM | POA: Diagnosis not present

## 2024-01-01 DIAGNOSIS — E785 Hyperlipidemia, unspecified: Secondary | ICD-10-CM | POA: Diagnosis not present

## 2024-01-01 DIAGNOSIS — J90 Pleural effusion, not elsewhere classified: Secondary | ICD-10-CM | POA: Diagnosis not present

## 2024-01-01 DIAGNOSIS — A419 Sepsis, unspecified organism: Secondary | ICD-10-CM | POA: Diagnosis not present

## 2024-01-01 LAB — BASIC METABOLIC PANEL WITH GFR
Anion gap: 11 (ref 5–15)
BUN: 43 mg/dL — ABNORMAL HIGH (ref 8–23)
CO2: 25 mmol/L (ref 22–32)
Calcium: 8.4 mg/dL — ABNORMAL LOW (ref 8.9–10.3)
Chloride: 107 mmol/L (ref 98–111)
Creatinine, Ser: 1.86 mg/dL — ABNORMAL HIGH (ref 0.44–1.00)
GFR, Estimated: 26 mL/min — ABNORMAL LOW (ref >60)
Glucose, Bld: 98 mg/dL (ref 70–99)
Potassium: 4.3 mmol/L (ref 3.5–5.1)
Sodium: 143 mmol/L (ref 135–145)

## 2024-01-01 LAB — CBC WITH DIFFERENTIAL/PLATELET
Abs Immature Granulocytes: 0.13 K/uL — ABNORMAL HIGH (ref 0.00–0.07)
Basophils Absolute: 0 K/uL (ref 0.0–0.1)
Basophils Relative: 0 %
Eosinophils Absolute: 0.3 K/uL (ref 0.0–0.5)
Eosinophils Relative: 2 %
HCT: 27.1 % — ABNORMAL LOW (ref 36.0–46.0)
Hemoglobin: 8.2 g/dL — ABNORMAL LOW (ref 12.0–15.0)
Immature Granulocytes: 1 %
Lymphocytes Relative: 12 %
Lymphs Abs: 1.6 K/uL (ref 0.7–4.0)
MCH: 29.1 pg (ref 26.0–34.0)
MCHC: 30.3 g/dL (ref 30.0–36.0)
MCV: 96.1 fL (ref 80.0–100.0)
Monocytes Absolute: 0.8 K/uL (ref 0.1–1.0)
Monocytes Relative: 6 %
Neutro Abs: 10.7 K/uL — ABNORMAL HIGH (ref 1.7–7.7)
Neutrophils Relative %: 79 %
Platelets: 313 K/uL (ref 150–400)
RBC: 2.82 MIL/uL — ABNORMAL LOW (ref 3.87–5.11)
RDW: 15.7 % — ABNORMAL HIGH (ref 11.5–15.5)
WBC: 13.6 K/uL — ABNORMAL HIGH (ref 4.0–10.5)
nRBC: 0 % (ref 0.0–0.2)

## 2024-01-01 MED ORDER — GUAIFENESIN ER 600 MG PO TB12: 600.0000 mg | ORAL_TABLET | Freq: Two times a day (BID) | ORAL | Status: AC

## 2024-01-01 MED ORDER — FUROSEMIDE 20 MG PO TABS
20.0000 mg | ORAL_TABLET | Freq: Every day | ORAL | Status: DC
  Administered 2024-01-01: 20 mg via ORAL
  Filled 2024-01-01: qty 1

## 2024-01-01 MED ORDER — POLYETHYLENE GLYCOL 3350 17 G PO PACK: 17.0000 g | PACK | Freq: Every day | ORAL | Status: AC | PRN

## 2024-01-01 MED ORDER — MELATONIN 3 MG PO TABS
3.0000 mg | ORAL_TABLET | Freq: Every evening | ORAL | Status: AC | PRN
Start: 2024-01-01 — End: ?

## 2024-01-01 MED ORDER — AMIODARONE HCL 200 MG PO TABS
200.0000 mg | ORAL_TABLET | Freq: Two times a day (BID) | ORAL | Status: AC
Start: 2024-01-01 — End: ?

## 2024-01-01 MED ORDER — METOPROLOL SUCCINATE ER 50 MG PO TB24: 50.0000 mg | ORAL_TABLET | Freq: Every day | ORAL | Status: DC

## 2024-01-01 NOTE — Progress Notes (Signed)
 Physical Therapy Treatment Patient Details Name: Courtney Cline MRN: [id number] DOB: October 21, 1933 Today's Date: 01/01/2024   History of Present Illness The pt is an 89 yo female presenting 11/6 with afib with RVR. Work up concerning for sepsis, admitted for resp failure likely due to PNA. PMH includes: HTN, HLD, afib, CAD, CKD III, CHF, breast cancer, obesity, COPD on 4L O2.    PT Comments  Pt resting in bed on arrival, agreeable to session and demonstrating steady progress towards acute goals. Pt requiring grossly min A for bed mobility, transfers sit<>stand and short in room gait with RW for support. Pt continues to be limited by decreased cardiopulmonary endurance, global weakness, decreased activity tolerance and impaired balance/postural reactions. Patient will benefit from continued inpatient follow up therapy, <3 hours/day, will continue to follow acutely.    If plan is discharge home, recommend the following: A lot of help with walking and/or transfers;A lot of help with bathing/dressing/bathroom;Assist for transportation;Help with stairs or ramp for entrance;Assistance with cooking/housework   Can travel by private vehicle     No  Equipment Recommendations  None recommended by PT    Recommendations for Other Services       Precautions / Restrictions Precautions Precautions: Fall Recall of Precautions/Restrictions: Impaired Precaution/Restrictions Comments: monitor HR, 4L O2 at baseline Restrictions Weight Bearing Restrictions Per Provider Order: No     Mobility  Bed Mobility Overal bed mobility: Needs Assistance Bed Mobility: Supine to Sit, Sit to Supine     Supine to sit: HOB elevated, Used rails, Min assist Sit to supine: Min assist, Used rails, HOB elevated   General bed mobility comments: min A to scoot out to EOB and to assist with LEs back to bed at end of session    Transfers Overall transfer level: Needs assistance Equipment used: Rolling walker (2  wheels) Transfers: Sit to/from Stand, Bed to chair/wheelchair/BSC Sit to Stand: Min assist           General transfer comment: cues for hand placement and min assist to power up and to steady    Ambulation/Gait Ambulation/Gait assistance: Min assist Gait Distance (Feet): 3 Feet (+ 5') Assistive device: Rolling walker (2 wheels) Gait Pattern/deviations: Step-to pattern, Decreased stride length, Trunk flexed, Step-through pattern Gait velocity: decreased     General Gait Details: Short steps with assist for stability and line management. Cues for RW proximity and for upright posture as pt with forward flexion throughout   Stairs             Wheelchair Mobility     Tilt Bed    Modified Rankin (Stroke Patients Only)       Balance Overall balance assessment: Needs assistance Sitting-balance support: Bilateral upper extremity supported, Feet supported Sitting balance-Leahy Scale: Fair Sitting balance - Comments: nt   Standing balance support: Bilateral upper extremity supported, During functional activity, Reliant on assistive device for balance Standing balance-Leahy Scale: Poor Standing balance comment: reliant on RW and sink for support when standing, patient with limited standing tolerance                            Communication Communication Communication: No apparent difficulties  Cognition Arousal: Alert Behavior During Therapy: WFL for tasks assessed/performed   PT - Cognitive impairments: No apparent impairments                         Following commands: Intact  Cueing Cueing Techniques: Verbal cues, Visual cues, Tactile cues  Exercises      General Comments General comments (skin integrity, edema, etc.): VSS on 4L O2      Pertinent Vitals/Pain Pain Assessment Pain Assessment: No/denies pain    Home Living                          Prior Function            PT Goals (current goals can now be  found in the care plan section) Acute Rehab PT Goals Patient Stated Goal: to gain more independence with mobility PT Goal Formulation: With patient/family Time For Goal Achievement: 01/06/24 Progress towards PT goals: Progressing toward goals    Frequency    Min 2X/week      PT Plan      Co-evaluation              AM-PAC PT 6 Clicks Mobility   Outcome Measure  Help needed turning from your back to your side while in a flat bed without using bedrails?: A Little Help needed moving from lying on your back to sitting on the side of a flat bed without using bedrails?: A Little Help needed moving to and from a bed to a chair (including a wheelchair)?: A Little Help needed standing up from a chair using your arms (e.g., wheelchair or bedside chair)?: A Little Help needed to walk in hospital room?: Total (<20') Help needed climbing 3-5 steps with a railing? : Total 6 Click Score: 14    End of Session Equipment Utilized During Treatment: Oxygen  Activity Tolerance: Patient limited by fatigue Patient left: with call bell/phone within reach;in bed;with bed alarm set Nurse Communication: Mobility status PT Visit Diagnosis: Unsteadiness on feet (R26.81);Other abnormalities of gait and mobility (R26.89);Muscle weakness (generalized) (M62.81);History of falling (Z91.81)     Time: 9088-9067 PT Time Calculation (min) (ACUTE ONLY): 21 min  Charges:    $Therapeutic Activity: 8-22 mins PT General Charges $$ ACUTE PT VISIT: 1 Visit                     Courtney Cline R. PTA Acute Rehabilitation Services Office: [phone]   Courtney Cline 01/01/2024, 9:33 AM

## 2024-01-01 NOTE — TOC Transition Note (Signed)
 Transition of Care [facility]) - Discharge Note   Patient Details  Name: Courtney Cline MRN: [id number] Date of Birth: January 13, 1934  [facility] CM/SW Contact:  Bridget Cordella Simmonds, LCSW Phone Number: 01/01/2024, 11:22 AM   Clinical Narrative:   Pt discharging to [facility]. RN report to [phone].  PTAR called [phone].     Final next level of care: Skilled Nursing Facility Barriers to Discharge: Barriers Resolved   Patient Goals and CMS Choice            Discharge Placement              Patient chooses bed at: [facility] Patient to be transferred to facility by: ptar Name of family member notified: son [name] Patient and family notified of of transfer: 01/01/24  Discharge Plan and Services Additional resources added to the After Visit Summary for                                       Social Drivers of Health (SDOH) Interventions SDOH Screenings   Food Insecurity: No Food Insecurity (12/21/2023)  Housing: Low Risk  (12/21/2023)  Transportation Needs: No Transportation Needs (12/21/2023)  Utilities: Not At Risk (12/21/2023)  Social Connections: Socially Isolated (12/21/2023)  Tobacco Use: Medium Risk (12/21/2023)     Readmission Risk Interventions    02/15/2023    1:37 PM  Readmission Risk Prevention Plan  Transportation Screening Complete  PCP or Specialist Appt within 5-7 Days Complete  Home Care Screening Complete  Medication Review (RN CM) Complete

## 2024-01-01 NOTE — Discharge Summary (Signed)
 Physician Discharge Summary  Courtney Cline FMW:[id number] DOB: [birth year] DOA: 12/21/2023  PCP: Elliot Charm, MD  Admit date: 12/21/2023 Discharge date: 01/01/2024  Admitted From: Home  Discharge disposition: Skilled nursing facility   Recommendations for Outpatient Follow-Up:   Follow up with your primary care provider at the skilled nursing facility in 3 to 5 days Check CBC, BMP, magnesium in the next visit Follow-up with cardiology as outpatient for atrial fibrillation follow-up in 1 to 2 weeks.  Call to make an appointment. Patient was on dysphagia 1 diet in the hospital.  Take aspiration precautions.  Continue pure on discharge.   Discharge Diagnosis:   Principal Problem:   Sepsis (HCC) Active Problems:   Breast cancer of upper-inner quadrant of right female breast (HCC)   Essential hypertension   Hyperlipidemia   Diastolic CHF (HCC)   Persistent atrial fibrillation (HCC)   CAP (community acquired pneumonia)   Acute on chronic respiratory failure with hypoxia (HCC)   CKD stage 3b, GFR 30-44 ml/min (HCC)   Obesity (BMI 30-39.9)   Normocytic anemia   COPD with acute exacerbation (HCC)   History of CVA (cerebrovascular accident)   Discharge Condition: Improved.  Diet recommendation: Low sodium, heart healthy.  Pured diet.  Aspiration precautions  Wound care: None.  Code status: DNR   History of Present Illness:   89 y.o. female with medical history significant of hypertension, hyperlipidemia, atrial fibrillation, CAD, CKD 3B, CHF, breast cancer, obesity, anemia, COPD, chronic respiratory failure on 3 L presented hospital with worsening shortness of breath for 2 days with cough.  EMS was called in and patient was noted to have A-fib with RVR with heart rate up to 200s.  Chest x-ray showed small pleural effusion with right lower lobe atelectasis versus airspace disease.  Initially patient and spontaneously converted but subsequently had A-fib  with RVR and required cardiology evaluation with subsequent Cardizem followed by amiodarone drip and now on oral amiodarone.  At this time, patient has completed course of antibiotics.  PT recommended skilled nursing facility   Hospital Course:   Following conditions were addressed during hospitalization as listed below,  Acute on chronic hypoxemic respiratory failure  Respiratory failure thought to be secondary to pneumonia as well as diastolic heart failure.  Patient is on 4 L of oxygen  at baseline and is currently on 4 liters per minute.  Overall improved at this time.  Speech and swallow evaluation done due to history of esophageal dysphagia.  Currently on dysphagia 1 diet which she is on at home.  Spoke about aspiration precautions and precautions while eating.  Will need to continue aspiration precautions.  Afebrile at this time.  Completed 6-day course of antibiotic.   Hypokalemia.  Replenished and improved.  Latest potassium 4.3.   Severe sepsis due to pneumonia Sepsis physiology has resolved.  Pneumonia likely from aspiration due to esophageal issue.  Patient initially had elevated lactic more than 2 with pneumonia and fever and leukocytosis on presentation suggestive of severe sepsis.  Completed course of antibiotic.  Currently on 4 L of oxygen  by nasal cannula .Continue incentive spirometry.  Aspiration precautions.  Complains of intermittent shortness of breath.   Atrial fibrillation with RVR Rate controlled at this time.  Continue amiodarone and metoprolol .  Plan is to follow-up with cardiology as outpatient.  Please schedule an appointment since patient is on amiodarone 200 mg twice daily at this time.   Essential hypertension Continue metoprolol .  Blood pressure stable.    Hyperlipidemia Continue  Lipitor    History of CVA. Continue Eliquis  and Lipitor .   AKI on CKD 3B Resolved,  Latest creatinine of 1.8 from 1.7< 2.2< 2.3 <1.9<1.6.  On oral hydration.  Received 1 unit of  packed RBC on 12/30/2023.    Monitor BMP in AM.  Creatinine likely at baseline at this time.  Lasix  will restarted on discharge.  Acute on chronic diastolic CHF 2D echocardiogram from September 2025 showed LV ejection fraction of 60 to 65% with grade 2 diastolic dysfunction.  Initially received IV Lasix  with good diuresis and was changed to oral Lasix .  BNP elevated at 489.  Patient is overall negative balance for 5580 mL.   Received 1 unit of packed RBC during hospitalization.  Lasix  was on hold due to AKI but will restart from today.  This will be continued on discharge.    History of breast cancer. No acute issues at this time   Anemia Received 1 unit of packed RBC for hemoglobin of 6.8.  Hemoglobin today at 8.2 after 1 unit of packed RBC 12/29/2023.  Check CBC as outpatient.   COPD Continue bronchodilators.  On baseline oxygen  requirement at this time.  Encourage deep breathing and incentive spirometry.   Debility deconditioning.  Physical therapy has seen the patient and plan for skilled nursing facility.   Disposition.  At this time, patient is stable for disposition home with outpatient cardiology follow-up  Medical Consultants:   Cardiology  Procedures:    None Subjective:   Today, patient  was seen and examined at bedside.  Patient states that she woke up early in the morning and has intermittent shortness of breath but no fever chills chest pain or increasing cough.    Discharge Exam:   Vitals:   01/01/24 0830 01/01/24 0836  BP: (!) 137/42   Pulse: 62 62  Resp: 17 17  Temp: 98 F (36.7 C)   SpO2: 99% 99%   Vitals:   01/01/24 0338 01/01/24 0500 01/01/24 0830 01/01/24 0836  BP: (!) 124/52  (!) 137/42   Pulse: 62  62 62  Resp: 18  17 17   Temp: 98.9 F (37.2 C)  98 F (36.7 C)   TempSrc: Oral     SpO2: 100%  99% 99%  Weight:  65 kg    Height:        General: Alert awake, not in obvious distress HENT: pupils equally reacting to light,  No scleral pallor or  icterus noted. Oral mucosa is moist.  Chest:  Clear breath sounds.  Diminished breath sounds bilaterally. No crackles or wheezes.  CVS: S1 &S2 heard. No murmur.  Regular rate and rhythm. Abdomen: Soft, nontender, nondistended.  Bowel sounds are heard.   Extremities: No cyanosis, clubbing or edema.  Peripheral pulses are palpable. Psych: Alert, awake and oriented, normal mood CNS:  No cranial nerve deficits.  Power equal in all extremities.   Skin: Warm and dry.  No rashes noted.  The results of significant diagnostics from this hospitalization (including imaging, microbiology, ancillary and laboratory) are listed below for reference.     Diagnostic Studies:   DG Chest Port 1 View Result Date: 12/21/2023 EXAM: 1 VIEW(S) XRAY OF THE CHEST 12/21/2023 12:24:00 PM COMPARISON: 11/21/2023 CLINICAL HISTORY: Questionable sepsis - evaluate for abnormality FINDINGS: LUNGS AND PLEURA: Background of emphysema and chronic interstitial coarsening. Small right pleural effusion and right lung base atelectasis or air space disease. Small left pleural effusion has decreased from previous exam. No pulmonary edema. No pneumothorax. HEART  AND MEDIASTINUM: Aortic calcification. BONES AND SOFT TISSUES: No acute osseous abnormality. IMPRESSION: 1. Small right pleural effusion with right lung base atelectasis or air space disease. Electronically signed by: Waddell Calk MD 12/21/2023 01:20 PM EST RP Workstation: [id number]     Labs:   Basic Metabolic Panel: Recent Labs  Lab 12/28/23 0448 12/29/23 0449 12/30/23 0500 12/31/23 0306 01/01/24 0423  NA 143 141 144 142 143  K 3.8 4.1 3.7 4.1 4.3  CL 102 103 104 103 107  CO2 31 31 32 26 25  GLUCOSE 87 77 95 93 98  BUN 30* 37* 35* 37* 43*  CREATININE 2.32* 2.22* 1.89* 1.73* 1.86*  CALCIUM  8.5* 8.4* 8.5* 8.3* 8.4*  MG  --   --  2.1 1.9  --   PHOS  --  2.9  --   --   --    GFR Estimated Creatinine Clearance: 17.6 mL/min (A) (by C-G formula based on SCr of 1.86  mg/dL (H)). Liver Function Tests: No results for input(s): AST, ALT, ALKPHOS, BILITOT, PROT, ALBUMIN in the last 168 hours. No results for input(s): LIPASE, AMYLASE in the last 168 hours. No results for input(s): AMMONIA in the last 168 hours. Coagulation profile No results for input(s): INR, PROTIME in the last 168 hours.  CBC: Recent Labs  Lab 12/29/23 0449 12/29/23 1334 12/30/23 0500 12/31/23 0306 01/01/24 0423  WBC 8.3  --  9.1 12.4* 13.6*  NEUTROABS  --   --   --   --  10.7*  HGB 6.8* 8.7* 8.7* 8.5* 8.2*  HCT 22.9* 28.0* 28.1* 27.7* 27.1*  MCV 97.9  --  94.9 95.2 96.1  PLT 376  --  359 328 313   Cardiac Enzymes: No results for input(s): CKTOTAL, CKMB, CKMBINDEX, TROPONINI in the last 168 hours. BNP: Invalid input(s): POCBNP CBG: No results for input(s): GLUCAP in the last 168 hours. D-Dimer No results for input(s): DDIMER in the last 72 hours. Hgb A1c No results for input(s): HGBA1C in the last 72 hours. Lipid Profile No results for input(s): CHOL, HDL, LDLCALC, TRIG, CHOLHDL, LDLDIRECT in the last 72 hours. Thyroid  function studies No results for input(s): TSH, T4TOTAL, T3FREE, THYROIDAB in the last 72 hours.  Invalid input(s): FREET3 Anemia work up No results for input(s): VITAMINB12, FOLATE, FERRITIN, TIBC, IRON , RETICCTPCT in the last 72 hours. Microbiology No results found for this or any previous visit (from the past 240 hours).   Discharge Instructions:   Discharge Instructions     Diet - low sodium heart healthy   Complete by: As directed    Puree diet   Discharge instructions   Complete by: As directed    Follow-up with your primary care provider at the skilled nursing facility in 3 to 5 days.  Check blood work at that time.  Seek medical attention for worsening symptoms.  Follow-up with cardiology as outpatient in 2 weeks regarding atrial fibrillation.  Continue to use  incentive spirometry after discharge.   Increase activity slowly   Complete by: As directed       Allergies as of 01/01/2024   No Known Allergies      Medication List     STOP taking these medications    hydrALAZINE  25 MG tablet Commonly known as: APRESOLINE    lidocaine  5 % Commonly known as: LIDODERM    metoprolol  tartrate 25 MG tablet Commonly known as: LOPRESSOR        TAKE these medications    acetaminophen  500 MG tablet Commonly known  as: TYLENOL  Take 2 tablets (1,000 mg total) by mouth 3 (three) times daily. What changed:  when to take this reasons to take this   albuterol  108 (90 Base) MCG/ACT inhaler Commonly known as: VENTOLIN  HFA INHALE TWO PUFFS into THE lungs EVERY 6 HOURS AS NEEDED FOR wheezing OR SHORTNESS OF BREATH   amiodarone 200 MG tablet Commonly known as: PACERONE Take 1 tablet (200 mg total) by mouth 2 (two) times daily.   apixaban  2.5 MG Tabs tablet Commonly known as: Eliquis  Take 1 tablet (2.5 mg total) by mouth 2 (two) times daily.   atorvastatin  80 MG tablet Commonly known as: LIPITOR  Take 1 tablet (80 mg total) by mouth daily.   CertaVite/Antioxidants Tabs Take 1 tablet by mouth daily.   Ferrous Fumarate 324 (106 Fe) MG Tabs tablet Commonly known as: HEMOCYTE - 106 mg FE Take 1 tablet (106 mg of iron  total) by mouth daily.   folic acid 1 MG tablet Commonly known as: FOLVITE Take 1 tablet (1 mg total) by mouth daily.   FT Nasal Spray 0.05 % nasal spray Generic drug: oxymetazoline  Place 1 spray into both nostrils 2 (two) times daily as needed for congestion (nosebleeds).   furosemide  20 MG tablet Commonly known as: LASIX  Take 1 tablet (20 mg total) by mouth daily.   guaiFENesin  600 MG 12 hr tablet Commonly known as: MUCINEX  Take 1 tablet (600 mg total) by mouth 2 (two) times daily.   megestrol 40 MG/ML suspension Commonly known as: MEGACE Take 5 mLs (200 mg total) by mouth 2 (two) times daily.   melatonin 3 MG Tabs  tablet Take 1 tablet (3 mg total) by mouth at bedtime as needed.   metoprolol  succinate 50 MG 24 hr tablet Commonly known as: TOPROL -XL Take 1 tablet (50 mg total) by mouth daily. Take with or immediately following a meal. Start taking on: January 02, 2024   mirtazapine  7.5 MG tablet Commonly known as: REMERON  Take 1 tablet (7.5 mg total) by mouth at bedtime.   OXYGEN  Inhale 4 L into the lungs continuous.   pantoprazole  40 MG tablet Commonly known as: PROTONIX  Take 1 tablet (40 mg total) by mouth 2 (two) times daily before a meal.   polyethylene glycol 17 g packet Commonly known as: MIRALAX  / GLYCOLAX  Take 17 g by mouth daily as needed for mild constipation or moderate constipation.   Trelegy Ellipta  200-62.5-25 MCG/ACT Aepb Generic drug: Fluticasone -Umeclidin-Vilant Inhale 1 puff into the lungs daily.   Vitamin B 12 500 MCG Tabs Take 1 tablet by mouth daily.   Vitamin D  50 MCG (2000 UT) Caps Take 2,000 Units by mouth daily.        Contact information for follow-up providers     Verlin Lonni BIRCH, MD. Schedule an appointment as soon as possible for a visit in 2 week(s).   Specialty: Cardiology Why: for atrial fibrillation followup Contact information: [address] [name] [address] [phone]              Contact information for after-discharge care     Destination     [facility] [place], [place] .   Service: Skilled Nursing Contact information: [address] [place]  [phone]             Home Medical Care     [facility]) .   Service: Home Health Services Contact information: [phone] [facility] [place]  [phone]  Time coordinating discharge: 39 minutes  Signed:  Shawndrea Rutkowski  [facility] 01/01/2024, 10:37 AM

## 2024-01-01 NOTE — Progress Notes (Signed)
 PROGRESS NOTE  Courtney Cline FMW:[id number] DOB: [birth year] DOA: 12/21/2023 PCP: Elliot Charm, MD   LOS: 11 days   Brief narrative:  89 y.o. female with medical history significant of hypertension, hyperlipidemia, atrial fibrillation, CAD, CKD 3B, CHF, breast cancer, obesity, anemia, COPD, chronic respiratory failure on 3 L presented hospital with worsening shortness of breath for 2 days with cough.  EMS was called in and patient was noted to have A-fib with RVR with heart rate up to 200s.  Chest x-ray showed small pleural effusion with right lower lobe atelectasis versus airspace disease.  Initially patient and spontaneously converted but subsequently had A-fib with RVR and required cardiology evaluation with subsequent Cardizem followed by amiodarone drip and now on oral amiodarone.  At this time, patient has completed course of antibiotics.  PT recommended skilled nursing facility which family initially refused and now wish to reconsider.    Assessment/Plan: Principal Problem:   Sepsis (HCC) Active Problems:   Breast cancer of upper-inner quadrant of right female breast (HCC)   Essential hypertension   Hyperlipidemia   Diastolic CHF (HCC)   Persistent atrial fibrillation (HCC)   CAP (community acquired pneumonia)   Acute on chronic respiratory failure with hypoxia (HCC)   CKD stage 3b, GFR 30-44 ml/min (HCC)   Obesity (BMI 30-39.9)   Normocytic anemia   COPD with acute exacerbation (HCC)   History of CVA (cerebrovascular accident)   Acute on chronic hypoxemic respiratory failure  Respiratory failure thought to be secondary to pneumonia as well as diastolic heart failure.  Patient is on 4 L of oxygen  at baseline and is currently on 4 liters per minute.  Overall improved at this time.  Speech and swallow evaluation done due to history of esophageal dysphagia.  Currently on dysphagia 1 diet which she is on at home.  Spoke about aspiration precautions and precautions  while eating.  Will need to continue aspiration precautions.  Afebrile at this time.  Completed 6-day course of antibiotic.  Hypokalemia.  Replenished and improved.  Latest potassium 4.3.   Severe sepsis due to pneumonia Sepsis physiology has resolved.  Pneumonia likely from aspiration due to esophageal issue.  Patient initially had elevated lactic more than 2 with pneumonia and fever and leukocytosis on presentation suggestive of severe sepsis.  Completed course of antibiotic.  Currently on 4 L of oxygen  by nasal cannula .Continue incentive spirometry.  Aspiration precautions.  Complains of intermittent shortness of breath.  Atrial fibrillation with RVR Rate controlled at this time.  Continue amiodarone and metoprolol .  Plan is to follow-up with cardiology as outpatient.    Essential hypertension Continue metoprolol .  Blood pressure stable.    Hyperlipidemia Continue Lipitor    History of CVA. Continue Eliquis  and Lipitor .   AKI on CKD 3B Improving.  Latest creatinine of 1.8 from 1.7< 2.2< 2.3 <1.9<1.6.  On oral hydration.  Received 1 unit of packed RBC on 12/30/2023.    Monitor BMP in AM.  Creatinine likely at baseline at this time.  Will restart Lasix  from today.  Acute on chronic diastolic CHF 2D echocardiogram from September 2025 showed LV ejection fraction of 60 to 65% with grade 2 diastolic dysfunction.  Initially received IV Lasix  with good diuresis and was changed to oral Lasix .  BNP elevated at 489.  Patient is overall negative balance for 5580 mL.  Continue strict intake and output charting.  Received 1 unit of packed RBC during hospitalization.  Lasix  was on hold due to AKI but will restart  from today.   History of breast cancer. No acute issues at this time  Anemia Received 1 unit of packed RBC for hemoglobin of 6.8.  Hemoglobin today at 8.2 after 1 unit of packed RBC 12/29/2023.     COPD Continue bronchodilators , incentive spirometry deep breathing.  On baseline oxygen   requirement at this time.  Encouraged deep breathing and incentive spirometry.  Debility deconditioning.  Physical therapy has seen the patient and plan for skilled nursing facility.  DVT prophylaxis: apixaban  (ELIQUIS ) tablet 2.5 mg Start: 12/21/23 2200 apixaban  (ELIQUIS ) tablet 2.5 mg   Disposition: Skilled nursing facility likely when bed available.  Patient is medically stable at this time.  Status is: Inpatient Remains inpatient appropriate because: , need for skilled nursing facility placement    Code Status:     Code Status: Limited: Do not attempt resuscitation (DNR) -DNR-LIMITED -Do Not Intubate/DNI   Family Communication: Spoke with the patient's son at bedside on 12/31/2023  Consultants: Cardiology  Procedures: None  Anti-infectives:  None currently.  Anti-infectives (From admission, onward)    Start     Dose/Rate Route Frequency Ordered Stop   12/25/23 1000  azithromycin  (ZITHROMAX ) tablet 500 mg  Status:  Discontinued        500 mg Oral Daily 12/24/23 1157 12/26/23 1007   12/25/23 0800  cefUROXime (CEFTIN) tablet 500 mg  Status:  Discontinued        500 mg Oral 2 times daily with meals 12/24/23 1158 12/24/23 1540   12/25/23 0800  cefUROXime (CEFTIN) tablet 250 mg  Status:  Discontinued        250 mg Oral 2 times daily with meals 12/24/23 1540 12/26/23 1007   12/22/23 1000  cefTRIAXone  (ROCEPHIN ) 2 g in sodium chloride  0.9 % 100 mL IVPB  Status:  Discontinued        2 g 200 mL/hr over 30 Minutes Intravenous Every 24 hours 12/21/23 1410 12/24/23 1157   12/22/23 1000  azithromycin  (ZITHROMAX ) 500 mg in sodium chloride  0.9 % 250 mL IVPB  Status:  Discontinued        500 mg 250 mL/hr over 60 Minutes Intravenous Every 24 hours 12/21/23 1410 12/24/23 1157   12/21/23 1145  cefTRIAXone  (ROCEPHIN ) 2 g in sodium chloride  0.9 % 100 mL IVPB        2 g 200 mL/hr over 30 Minutes Intravenous Once 12/21/23 1141 12/21/23 1234   12/21/23 1145  azithromycin  (ZITHROMAX ) 500 mg  in sodium chloride  0.9 % 250 mL IVPB        500 mg 250 mL/hr over 60 Minutes Intravenous  Once 12/21/23 1141 12/21/23 1313       Subjective: Today, patient was seen and examined at bedside.  Patient states that she woke up early in the morning and has intermittent shortness of breath but no fever chills chest pain or increasing cough.    Objective: Vitals:   01/01/24 0830 01/01/24 0836  BP: (!) 137/42   Pulse: 62 62  Resp: 17 17  Temp: 98 F (36.7 C)   SpO2: 99% 99%    Intake/Output Summary (Last 24 hours) at 01/01/2024 0842 Last data filed at 01/01/2024 0400 Gross per 24 hour  Intake 240 ml  Output 200 ml  Net 40 ml    Filed Weights   12/21/23 1147 01/01/24 0500  Weight: 68 kg 65 kg   Body mass index is 27.99 kg/m.   Physical Exam:  General:  Average built, not in obvious distress, elderly female,  appears weak and deconditioned, on bolus of oxygen . HENT:   No scleral pallor or icterus noted. Oral mucosa is moist.  Chest:  .  Diminished breath sounds bilaterally.  Coarse breath sounds noted. CVS: S1 &S2 heard. No murmur.  Irregular rhythm but controlled. Abdomen: Soft, nontender, nondistended.  Bowel sounds are heard.   Extremities: No cyanosis, clubbing or edema.  Peripheral pulses are palpable. Psych: Alert, awake and oriented, normal mood CNS:  No cranial nerve deficits.  Generalized weakness noted. Skin: Warm and dry.  No rashes noted.   Data Review: I have personally reviewed the following laboratory data and studies,  CBC: Recent Labs  Lab 12/29/23 0449 12/29/23 1334 12/30/23 0500 12/31/23 0306 01/01/24 0423  WBC 8.3  --  9.1 12.4* 13.6*  NEUTROABS  --   --   --   --  10.7*  HGB 6.8* 8.7* 8.7* 8.5* 8.2*  HCT 22.9* 28.0* 28.1* 27.7* 27.1*  MCV 97.9  --  94.9 95.2 96.1  PLT 376  --  359 328 313   Basic Metabolic Panel: Recent Labs  Lab 12/28/23 0448 12/29/23 0449 12/30/23 0500 12/31/23 0306 01/01/24 0423  NA 143 141 144 142 143  K 3.8 4.1  3.7 4.1 4.3  CL 102 103 104 103 107  CO2 31 31 32 26 25  GLUCOSE 87 77 95 93 98  BUN 30* 37* 35* 37* 43*  CREATININE 2.32* 2.22* 1.89* 1.73* 1.86*  CALCIUM  8.5* 8.4* 8.5* 8.3* 8.4*  MG  --   --  2.1 1.9  --   PHOS  --  2.9  --   --   --    Liver Function Tests: No results for input(s): AST, ALT, ALKPHOS, BILITOT, PROT, ALBUMIN in the last 168 hours.  No results for input(s): LIPASE, AMYLASE in the last 168 hours. No results for input(s): AMMONIA in the last 168 hours. Cardiac Enzymes: No results for input(s): CKTOTAL, CKMB, CKMBINDEX, TROPONINI in the last 168 hours. BNP (last 3 results) Recent Labs    02/13/23 2158 12/22/23 0517  BNP 361.3* 489.6*    ProBNP (last 3 results) No results for input(s): PROBNP in the last 8760 hours.  CBG: No results for input(s): GLUCAP in the last 168 hours. No results found for this or any previous visit (from the past 240 hours).    Studies: No results found.    Vernal Alstrom, MD  [facility] 01/01/2024  If 7PM-7AM, please contact night-coverage

## 2024-01-01 NOTE — Plan of Care (Signed)
  Problem: Fluid Volume: Goal: Hemodynamic stability will improve Outcome: Adequate for Discharge   Problem: Clinical Measurements: Goal: Diagnostic test results will improve Outcome: Adequate for Discharge Goal: Signs and symptoms of infection will decrease Outcome: Adequate for Discharge   Problem: Respiratory: Goal: Ability to maintain adequate ventilation will improve Outcome: Adequate for Discharge   Problem: Education: Goal: Knowledge of General Education information will improve Description: Including pain rating scale, medication(s)/side effects and non-pharmacologic comfort measures Outcome: Adequate for Discharge   Problem: Health Behavior/Discharge Planning: Goal: Ability to manage health-related needs will improve Outcome: Adequate for Discharge   Problem: Clinical Measurements: Goal: Ability to maintain clinical measurements within normal limits will improve Outcome: Adequate for Discharge Goal: Will remain free from infection Outcome: Adequate for Discharge Goal: Diagnostic test results will improve Outcome: Adequate for Discharge Goal: Respiratory complications will improve Outcome: Adequate for Discharge Goal: Cardiovascular complication will be avoided Outcome: Adequate for Discharge   Problem: Activity: Goal: Risk for activity intolerance will decrease Outcome: Adequate for Discharge   Problem: Nutrition: Goal: Adequate nutrition will be maintained Outcome: Adequate for Discharge   Problem: Coping: Goal: Level of anxiety will decrease Outcome: Adequate for Discharge   Problem: Elimination: Goal: Will not experience complications related to bowel motility Outcome: Adequate for Discharge Goal: Will not experience complications related to urinary retention Outcome: Adequate for Discharge   Problem: Pain Managment: Goal: General experience of comfort will improve and/or be controlled Outcome: Adequate for Discharge   Problem: Safety: Goal:  Ability to remain free from injury will improve Outcome: Adequate for Discharge   Problem: Skin Integrity: Goal: Risk for impaired skin integrity will decrease Outcome: Adequate for Discharge   Problem: Acute Rehab PT Goals(only PT should resolve) Goal: Pt Will Go Supine/Side To Sit Outcome: Adequate for Discharge Goal: Patient Will Perform Sitting Balance Outcome: Adequate for Discharge Goal: Patient Will Transfer Sit To/From Stand Outcome: Adequate for Discharge Goal: Pt Will Ambulate Outcome: Adequate for Discharge Goal: Pt Will Go Up/Down Stairs Outcome: Adequate for Discharge Goal: Pt/caregiver will Perform Home Exercise Program Outcome: Adequate for Discharge   Problem: Acute Rehab OT Goals (only OT should resolve) Goal: Pt. Will Perform Grooming Outcome: Adequate for Discharge Goal: Pt. Will Perform Upper Body Dressing Outcome: Adequate for Discharge Goal: Pt. Will Perform Lower Body Dressing Outcome: Adequate for Discharge Goal: Pt. Will Transfer To Toilet Outcome: Adequate for Discharge Goal: Pt. Will Perform Toileting-Clothing Manipulation Outcome: Adequate for Discharge Goal: OT Additional ADL Goal #1 Outcome: Adequate for Discharge

## 2024-01-01 NOTE — Progress Notes (Signed)
 Patient transported to SNF by [facility]. Discharge packet sent with [facility].  Malikhi Ogan, Cena Helling, RN

## 2024-01-01 NOTE — TOC Progression Note (Addendum)
 Transition of Care [facility]) - Progression Note    Patient Details  Name: Courtney Cline MRN: [id number] Date of Birth: [birth year]  Transition of [facility]) CM/SW Contact  Bridget Cordella Simmonds, LCSW Phone Number: 01/01/2024, 10:10 AM  Clinical Narrative:   SNF auth request submitted in [place] and approved: [id number], 3 days: 11/17-11/19.  CSW confirmed with [facility] that they can receive pt today.    MD informed.   Expected Discharge Plan: Skilled Nursing Facility Barriers to Discharge: Continued Medical Work up, English As A Second Language Teacher               Expected Discharge Plan and Services       Living arrangements for the past 2 months: Single Family Home                                       Social Drivers of Health (SDOH) Interventions SDOH Screenings   Food Insecurity: No Food Insecurity (12/21/2023)  Housing: Low Risk  (12/21/2023)  Transportation Needs: No Transportation Needs (12/21/2023)  Utilities: Not At Risk (12/21/2023)  Social Connections: Socially Isolated (12/21/2023)  Tobacco Use: Medium Risk (12/21/2023)    Readmission Risk Interventions    02/15/2023    1:37 PM  Readmission Risk Prevention Plan  Transportation Screening Complete  PCP or Specialist Appt within 5-7 Days Complete  Home Care Screening Complete  Medication Review (RN CM) Complete

## 2024-01-01 NOTE — Progress Notes (Signed)
 Attempted to call report to [facility] no answer. Discharge packet prepared will be sent with [facility].  Daymion Nazaire, Cena Helling, RN

## 2024-01-02 DIAGNOSIS — E785 Hyperlipidemia, unspecified: Secondary | ICD-10-CM | POA: Diagnosis not present

## 2024-01-02 DIAGNOSIS — B370 Candidal stomatitis: Secondary | ICD-10-CM | POA: Diagnosis not present

## 2024-01-02 DIAGNOSIS — E46 Unspecified protein-calorie malnutrition: Secondary | ICD-10-CM | POA: Diagnosis not present

## 2024-01-02 DIAGNOSIS — K219 Gastro-esophageal reflux disease without esophagitis: Secondary | ICD-10-CM | POA: Diagnosis not present

## 2024-01-04 DIAGNOSIS — B370 Candidal stomatitis: Secondary | ICD-10-CM | POA: Diagnosis not present

## 2024-01-09 DIAGNOSIS — I4891 Unspecified atrial fibrillation: Secondary | ICD-10-CM | POA: Diagnosis not present

## 2024-01-09 DIAGNOSIS — J449 Chronic obstructive pulmonary disease, unspecified: Secondary | ICD-10-CM | POA: Diagnosis not present

## 2024-01-09 DIAGNOSIS — N1832 Chronic kidney disease, stage 3b: Secondary | ICD-10-CM | POA: Diagnosis not present

## 2024-01-09 DIAGNOSIS — I5030 Unspecified diastolic (congestive) heart failure: Secondary | ICD-10-CM | POA: Diagnosis not present

## 2024-01-10 DIAGNOSIS — J9611 Chronic respiratory failure with hypoxia: Secondary | ICD-10-CM | POA: Diagnosis not present

## 2024-01-10 DIAGNOSIS — Z8673 Personal history of transient ischemic attack (TIA), and cerebral infarction without residual deficits: Secondary | ICD-10-CM | POA: Diagnosis not present

## 2024-01-10 DIAGNOSIS — J90 Pleural effusion, not elsewhere classified: Secondary | ICD-10-CM | POA: Diagnosis not present

## 2024-01-10 DIAGNOSIS — R1312 Dysphagia, oropharyngeal phase: Secondary | ICD-10-CM | POA: Diagnosis not present

## 2024-01-10 DIAGNOSIS — M6281 Muscle weakness (generalized): Secondary | ICD-10-CM | POA: Diagnosis not present

## 2024-01-10 DIAGNOSIS — J449 Chronic obstructive pulmonary disease, unspecified: Secondary | ICD-10-CM | POA: Diagnosis not present

## 2024-01-10 DIAGNOSIS — I4891 Unspecified atrial fibrillation: Secondary | ICD-10-CM | POA: Diagnosis not present

## 2024-01-10 DIAGNOSIS — I5030 Unspecified diastolic (congestive) heart failure: Secondary | ICD-10-CM | POA: Diagnosis not present

## 2024-01-10 DIAGNOSIS — E440 Moderate protein-calorie malnutrition: Secondary | ICD-10-CM | POA: Diagnosis not present

## 2024-01-12 DIAGNOSIS — E785 Hyperlipidemia, unspecified: Secondary | ICD-10-CM | POA: Diagnosis not present

## 2024-01-12 DIAGNOSIS — E46 Unspecified protein-calorie malnutrition: Secondary | ICD-10-CM | POA: Diagnosis not present

## 2024-01-12 DIAGNOSIS — K219 Gastro-esophageal reflux disease without esophagitis: Secondary | ICD-10-CM | POA: Diagnosis not present

## 2024-01-12 DIAGNOSIS — B370 Candidal stomatitis: Secondary | ICD-10-CM | POA: Diagnosis not present

## 2024-01-14 DIAGNOSIS — I130 Hypertensive heart and chronic kidney disease with heart failure and stage 1 through stage 4 chronic kidney disease, or unspecified chronic kidney disease: Secondary | ICD-10-CM | POA: Diagnosis not present

## 2024-01-14 DIAGNOSIS — Z809 Family history of malignant neoplasm, unspecified: Secondary | ICD-10-CM | POA: Diagnosis not present

## 2024-01-14 DIAGNOSIS — I700 Atherosclerosis of aorta: Secondary | ICD-10-CM | POA: Diagnosis not present

## 2024-01-14 DIAGNOSIS — E569 Vitamin deficiency, unspecified: Secondary | ICD-10-CM | POA: Diagnosis not present

## 2024-01-14 DIAGNOSIS — Z823 Family history of stroke: Secondary | ICD-10-CM | POA: Diagnosis not present

## 2024-01-14 DIAGNOSIS — Z6827 Body mass index (BMI) 27.0-27.9, adult: Secondary | ICD-10-CM | POA: Diagnosis not present

## 2024-01-14 DIAGNOSIS — Z9181 History of falling: Secondary | ICD-10-CM | POA: Diagnosis not present

## 2024-01-14 DIAGNOSIS — N184 Chronic kidney disease, stage 4 (severe): Secondary | ICD-10-CM | POA: Diagnosis not present

## 2024-01-14 DIAGNOSIS — Z87891 Personal history of nicotine dependence: Secondary | ICD-10-CM | POA: Diagnosis not present

## 2024-01-14 DIAGNOSIS — Z7901 Long term (current) use of anticoagulants: Secondary | ICD-10-CM | POA: Diagnosis not present

## 2024-01-14 DIAGNOSIS — D6869 Other thrombophilia: Secondary | ICD-10-CM | POA: Diagnosis not present

## 2024-01-14 DIAGNOSIS — D529 Folate deficiency anemia, unspecified: Secondary | ICD-10-CM | POA: Diagnosis not present

## 2024-01-14 DIAGNOSIS — J449 Chronic obstructive pulmonary disease, unspecified: Secondary | ICD-10-CM | POA: Diagnosis not present

## 2024-01-14 DIAGNOSIS — I739 Peripheral vascular disease, unspecified: Secondary | ICD-10-CM | POA: Diagnosis not present

## 2024-01-14 DIAGNOSIS — I4891 Unspecified atrial fibrillation: Secondary | ICD-10-CM | POA: Diagnosis not present

## 2024-01-14 DIAGNOSIS — E785 Hyperlipidemia, unspecified: Secondary | ICD-10-CM | POA: Diagnosis not present

## 2024-01-14 DIAGNOSIS — M810 Age-related osteoporosis without current pathological fracture: Secondary | ICD-10-CM | POA: Diagnosis not present

## 2024-01-14 DIAGNOSIS — I2510 Atherosclerotic heart disease of native coronary artery without angina pectoris: Secondary | ICD-10-CM | POA: Diagnosis not present

## 2024-01-14 DIAGNOSIS — Z853 Personal history of malignant neoplasm of breast: Secondary | ICD-10-CM | POA: Diagnosis not present

## 2024-01-14 DIAGNOSIS — F324 Major depressive disorder, single episode, in partial remission: Secondary | ICD-10-CM | POA: Diagnosis not present

## 2024-01-14 DIAGNOSIS — I509 Heart failure, unspecified: Secondary | ICD-10-CM | POA: Diagnosis not present

## 2024-01-14 DIAGNOSIS — Z9989 Dependence on other enabling machines and devices: Secondary | ICD-10-CM | POA: Diagnosis not present

## 2024-01-14 DIAGNOSIS — Z79899 Other long term (current) drug therapy: Secondary | ICD-10-CM | POA: Diagnosis not present

## 2024-01-14 DIAGNOSIS — Z8673 Personal history of transient ischemic attack (TIA), and cerebral infarction without residual deficits: Secondary | ICD-10-CM | POA: Diagnosis not present

## 2024-01-14 DIAGNOSIS — Z9981 Dependence on supplemental oxygen: Secondary | ICD-10-CM | POA: Diagnosis not present

## 2024-01-15 ENCOUNTER — Other Ambulatory Visit (HOSPITAL_COMMUNITY): Payer: Self-pay

## 2024-01-16 ENCOUNTER — Ambulatory Visit: Admitting: Cardiovascular Disease

## 2024-01-18 DIAGNOSIS — I480 Paroxysmal atrial fibrillation: Secondary | ICD-10-CM | POA: Diagnosis not present

## 2024-01-18 DIAGNOSIS — D500 Iron deficiency anemia secondary to blood loss (chronic): Secondary | ICD-10-CM | POA: Diagnosis not present

## 2024-01-18 DIAGNOSIS — Z8701 Personal history of pneumonia (recurrent): Secondary | ICD-10-CM | POA: Diagnosis not present

## 2024-01-18 DIAGNOSIS — I5032 Chronic diastolic (congestive) heart failure: Secondary | ICD-10-CM | POA: Diagnosis not present

## 2024-01-18 DIAGNOSIS — D72829 Elevated white blood cell count, unspecified: Secondary | ICD-10-CM | POA: Diagnosis not present

## 2024-01-18 DIAGNOSIS — R197 Diarrhea, unspecified: Secondary | ICD-10-CM | POA: Diagnosis not present

## 2024-01-18 DIAGNOSIS — K921 Melena: Secondary | ICD-10-CM | POA: Diagnosis not present

## 2024-01-19 DIAGNOSIS — K921 Melena: Secondary | ICD-10-CM | POA: Diagnosis not present

## 2024-01-19 DIAGNOSIS — Z9981 Dependence on supplemental oxygen: Secondary | ICD-10-CM | POA: Diagnosis not present

## 2024-01-19 DIAGNOSIS — I69891 Dysphagia following other cerebrovascular disease: Secondary | ICD-10-CM | POA: Diagnosis not present

## 2024-01-19 DIAGNOSIS — Z8673 Personal history of transient ischemic attack (TIA), and cerebral infarction without residual deficits: Secondary | ICD-10-CM | POA: Diagnosis not present

## 2024-01-19 DIAGNOSIS — R197 Diarrhea, unspecified: Secondary | ICD-10-CM | POA: Diagnosis not present

## 2024-01-19 DIAGNOSIS — Z8679 Personal history of other diseases of the circulatory system: Secondary | ICD-10-CM | POA: Diagnosis not present

## 2024-01-19 DIAGNOSIS — D500 Iron deficiency anemia secondary to blood loss (chronic): Secondary | ICD-10-CM | POA: Diagnosis not present

## 2024-01-19 DIAGNOSIS — R109 Unspecified abdominal pain: Secondary | ICD-10-CM | POA: Diagnosis not present

## 2024-01-23 ENCOUNTER — Ambulatory Visit: Admitting: Cardiovascular Disease

## 2024-01-24 ENCOUNTER — Telehealth: Payer: Self-pay | Admitting: Licensed Clinical Social Worker

## 2024-01-24 ENCOUNTER — Ambulatory Visit: Attending: Cardiovascular Disease | Admitting: Cardiovascular Disease

## 2024-01-24 ENCOUNTER — Encounter: Payer: Self-pay | Admitting: Cardiovascular Disease

## 2024-01-24 VITALS — BP 100/62 | HR 75 | Ht 61.0 in | Wt 139.0 lb

## 2024-01-24 DIAGNOSIS — J449 Chronic obstructive pulmonary disease, unspecified: Secondary | ICD-10-CM | POA: Diagnosis not present

## 2024-01-24 DIAGNOSIS — I10 Essential (primary) hypertension: Secondary | ICD-10-CM | POA: Diagnosis not present

## 2024-01-24 DIAGNOSIS — I350 Nonrheumatic aortic (valve) stenosis: Secondary | ICD-10-CM | POA: Diagnosis not present

## 2024-01-24 DIAGNOSIS — R600 Localized edema: Secondary | ICD-10-CM | POA: Diagnosis not present

## 2024-01-24 DIAGNOSIS — I4819 Other persistent atrial fibrillation: Secondary | ICD-10-CM | POA: Diagnosis not present

## 2024-01-24 DIAGNOSIS — I771 Stricture of artery: Secondary | ICD-10-CM | POA: Diagnosis not present

## 2024-01-24 DIAGNOSIS — I5032 Chronic diastolic (congestive) heart failure: Secondary | ICD-10-CM

## 2024-01-24 DIAGNOSIS — E782 Mixed hyperlipidemia: Secondary | ICD-10-CM | POA: Diagnosis not present

## 2024-01-24 NOTE — Assessment & Plan Note (Signed)
 Minimal edema on exam, on oral diuretics for diastolic heart failure

## 2024-01-24 NOTE — Patient Instructions (Addendum)
 Medication Instructions:  NO CHANGES   Lab Work: NONE TO BE DONE TODAY.  Testing/Procedures: NONE  Follow-Up: At [facility], you and your health needs are our priority.  As part of our continuing mission to provide you with exceptional heart care, our providers are all part of one team.  This team includes your primary Cardiologist (physician) and Advanced Practice Providers or APPs (Physician Assistants and Nurse Practitioners) who all work together to provide you with the care you need, when you need it.  Your next appointment:   3 MONTHS WITH ANY APP 1 YEAR WITH DR. COURT, MD  Provider:   Dorn Court, MD

## 2024-01-24 NOTE — Assessment & Plan Note (Signed)
 PAF maintaining sinus rhythm on Eliquis  and amiodarone .  This was etiologic in her stroke in September.

## 2024-01-24 NOTE — Telephone Encounter (Signed)
 H&V Care Navigation CSW Progress Note  Clinical Social Worker contacted by [employer], CMA,this patient is in need of home health care and PTO as she just had a stroke and needs assistance.could you please advise.  Responded and let CMA know home health orders and DME should be ordered by clinical staff and sent to pt preferred home health agency. If the team isnt sure how to complete this then please reach out to clinic leadership. I also let Celeste, RN and team lead know about request as well to close loop.   Patient is participating in a Managed Medicaid Plan:  No, [employer] Medicare  SDOH Screenings   Food Insecurity: No Food Insecurity (12/21/2023)  Housing: Low Risk  (12/21/2023)  Transportation Needs: No Transportation Needs (12/21/2023)  Utilities: Not At Risk (12/21/2023)  Social Connections: Socially Isolated (12/21/2023)  Tobacco Use: Medium Risk (01/24/2024)    Marit Lark, MSW, LCSW Clinical Social Worker II [facility] Heart/Vascular Care Navigation  [phone]- work cell phone (preferred)

## 2024-01-24 NOTE — Assessment & Plan Note (Signed)
 Diastolic heart failure on furosemide .  She appears euvolemic.

## 2024-01-24 NOTE — Progress Notes (Signed)
 01/24/2024 Courtney Cline [facility]   09/15/1933  [id number]  Primary Physician Courtney Charm, MD Primary Cardiologist: Courtney JINNY Lesches MD [facility], [place], [place]  HPI:  Courtney Cline is a 89 y.o.    married Caucasian female mother of 2, grandmother 4 grandchildren  was initially referred to me by Dr. Lannie Cline for peripheral vascular evaluation because of presumed left subclavian artery stenosis and/or steal. I last saw her in the office 06/28/2023.  She is accompanied by her son Courtney Cline.  Apparently her husband Courtney Cline is also a patient of mine.. Her cardiac risk factor profile is notable for remote tobacco abuse having quit 22 years ago, treated hypertension, and hyperlipidemia. She has never had a heart attack or stroke. She was a symptomatically with regards to left upper extremity claudication or dizziness and therefore continue conservative treatment of her subclavian artery stenosis was recommended at that time. She has noticed increasing bilateral lower extremity edema over the last one month which has improved somewhat with the addition of an oral diuretic. She does admit to dietary indiscretion. She is on chronic home O2 for her COPD and has not noticed any increasing shortness of breath.   Since I saw her in the office 6 months ago she was admitted in September for CVA related to A-fib.  She does have mild aortic stenosis.  She had right sided motor deficits.  She was readmitted in November for pneumonia and A-fib and has been in a rehab facility until several weeks ago.  Unfortunately her husband is 72 years, Courtney Cline who is also a patient mine passed away just before Thanksgiving.  She is on 4 L of O2 continuously.  She walks with a walker at home.   No outpatient medications have been marked as taking for the 01/24/24 encounter (Office Visit) with Cline Courtney JINNY, MD.     No Known Allergies  Social History   Socioeconomic History   Marital status: Married     Spouse name: Courtney Cline   Number of children: 2   Years of education: Not on file   Highest education level: Not on file  Occupational History   Occupation: retired    Comment: Programmer, [employer]  Tobacco Use   Smoking status: Former    Current packs/day: 0.00    Average packs/day: 1 pack/day for 40.0 years (40.0 ttl pk-yrs)    Types: Cigarettes    Start date: 02/14/1954    Quit date: 02/14/1994    Years since quitting: 29.9   Smokeless tobacco: Never  Vaping Use   Vaping status: Never Used  Substance and Sexual Activity   Alcohol  use: No   Drug use: No   Sexual activity: Not on file  Other Topics Concern   Not on file  Social History Narrative   Not on file   Social Drivers of Health   Financial Resource Strain: Not on file  Food Insecurity: No Food Insecurity (12/21/2023)   Hunger Vital Sign    Worried About Running Out of Food in the Last Year: Never true    Ran Out of Food in the Last Year: Never true  Transportation Needs: No Transportation Needs (12/21/2023)   PRAPARE - Administrator, Civil Service (Medical): No    Lack of Transportation (Non-Medical): No  Physical Activity: Not on file  Stress: Not on file  Social Connections: Socially Isolated (12/21/2023)   Social Connection and Isolation Panel    Frequency of Communication with Friends and  Family: Once a week    Frequency of Social Gatherings with Friends and Family: Once a week    Attends Religious Services: Never    Database Administrator or Organizations: No    Attends Banker Meetings: Never    Marital Status: Married  Catering Manager Violence: Not At Risk (12/21/2023)   Humiliation, Afraid, Rape, and Kick questionnaire    Fear of Current or Ex-Partner: No    Emotionally Abused: No    Physically Abused: No    Sexually Abused: No     Review of Systems: General: negative for chills, fever, night sweats or weight changes.  Cardiovascular: negative for chest pain, dyspnea on exertion, edema,  orthopnea, palpitations, paroxysmal nocturnal dyspnea or shortness of breath Dermatological: negative for rash Respiratory: negative for cough or wheezing Urologic: negative for hematuria Abdominal: negative for nausea, vomiting, diarrhea, bright red blood per rectum, melena, or hematemesis Neurologic: negative for visual changes, syncope, or dizziness All other systems reviewed and are otherwise negative except as noted above.    Blood pressure 100/62, pulse 75, height 5' 1 (1.549 m), weight 139 lb (63 kg), SpO2 96%.  General appearance: alert and no distress Neck: no adenopathy, no carotid bruit, no JVD, supple, symmetrical, trachea midline, and thyroid  not enlarged, symmetric, no tenderness/mass/nodules Lungs: clear to auscultation bilaterally Heart: regular rate and rhythm, S1, S2 normal, no murmur, click, rub or gallop Extremities: extremities normal, atraumatic, no cyanosis or edema Pulses: 2+ and symmetric Skin: Skin color, texture, turgor normal. No rashes or lesions Neurologic: Grossly normal  EKG EKG Interpretation Date/Time:  Wednesday January 24 2024 14:01:31 EST Ventricular Rate:  75 PR Interval:  122 QRS Duration:  82 QT Interval:  364 QTC Calculation: 406 R Axis:   -8  Text Interpretation: Normal sinus rhythm Moderate voltage criteria for LVH, may be normal variant ( R in aVL , Cornell product ) Nonspecific T wave abnormality When compared with ECG of 21-Dec-2023 13:11, PREVIOUS ECG IS PRESENT Confirmed by Courtney Cline [id number]) on 01/24/2024 2:08:41 PM    ASSESSMENT AND PLAN:   COPD without exacerbation (HCC) On 4 L home O2  Subclavian artery stenosis, left Asymptomatic  Bilateral lower extremity edema Minimal edema on exam, on oral diuretics for diastolic heart failure  Essential hypertension History of essential hypertension blood pressure measured today at 100/62.  She is on metoprolol .  Hyperlipidemia History of hyperlipidemia on statin therapy  with lipid profile performed 11/06/2023 revealing total cholesterol 99, LDL 41 and HDL 46.  Diastolic CHF (HCC) Diastolic heart failure on furosemide .  She appears euvolemic  Aortic stenosis Mild aortic stenosis by 2D echo performed 11/06/2023 with a valve area 1.21 cm.  Given her age and comorbidities I do not feel compelled to continue to follow this noninvasively  Persistent atrial fibrillation (HCC) PAF maintaining sinus rhythm on Eliquis  and amiodarone .  This was etiologic in her stroke in September.     Cline DOROTHA Court MD FACP,FACC,FAHA, [facility] 01/24/2024 2:20 PM

## 2024-01-24 NOTE — Assessment & Plan Note (Signed)
 Mild aortic stenosis by 2D echo performed 11/06/2023 with a valve area 1.21 cm.  Given her age and comorbidities I do not feel compelled to continue to follow this noninvasively

## 2024-01-24 NOTE — Assessment & Plan Note (Signed)
On 4L home O2

## 2024-01-24 NOTE — Assessment & Plan Note (Signed)
 History of hyperlipidemia on statin therapy with lipid profile performed 11/06/2023 revealing total cholesterol 99, LDL 41 and HDL 46.

## 2024-01-24 NOTE — Assessment & Plan Note (Signed)
 Asymptomatic.

## 2024-01-24 NOTE — Assessment & Plan Note (Signed)
 History of essential hypertension blood pressure measured today at 100/62.  She is on metoprolol .

## 2024-01-25 ENCOUNTER — Other Ambulatory Visit: Payer: Self-pay | Admitting: *Deleted

## 2024-01-25 DIAGNOSIS — I639 Cerebral infarction, unspecified: Secondary | ICD-10-CM

## 2024-01-31 ENCOUNTER — Inpatient Hospital Stay

## 2024-02-07 ENCOUNTER — Inpatient Hospital Stay

## 2024-02-07 ENCOUNTER — Inpatient Hospital Stay: Attending: Hematology

## 2024-02-27 ENCOUNTER — Telehealth: Payer: Self-pay | Admitting: Cardiovascular Disease

## 2024-02-27 ENCOUNTER — Other Ambulatory Visit: Payer: Self-pay

## 2024-02-27 MED ORDER — METOPROLOL SUCCINATE ER 50 MG PO TB24: 50.0000 mg | ORAL_TABLET | Freq: Every day | ORAL | 3 refills | Status: AC

## 2024-02-27 NOTE — Telephone Encounter (Signed)
 Tried calling Pt, son and Luke to verify pharmacy and stated that we were sending in the medication for her. Did not get anyone. Medication sent to pharmacy on file

## 2024-02-27 NOTE — Telephone Encounter (Signed)
 Pt c/o medication issue:  1. Name of Medication:   metoprolol  succinate (TOPROL -XL) 50 MG 24 hr tablet   2. How are you currently taking this medication (dosage and times per day)?   3. Are you having a reaction (difficulty breathing--STAT)?   4. What is your medication issue?   Caller Rick) called to report patient's son told her patient has been without this medication since beginning of January.  Caller noted she saw patient yesterday afternoon and patient has been in Afib with heart rate readings ranging 84-130.

## 2024-02-27 NOTE — Telephone Encounter (Signed)
 Spoke with Luke, with [employer]. First time seeing patient yesterday at home, son stated pt had stopped taking her Metoprolol  a few weeks ago. Had run out and PCP would not refill it and didn't call Cardiology. Ever since then has been having some afib episodes with HR fluctuating 80-130's. Asymptomatic. No shortness of breath. Also stated patient vitals were stable at visit. Last refill was given by hospitalist when patient hospitalized 11/6-11/17. Will send to Dr Court for further advisement and cofirm if he is okay refilling under his name. LOV in December 2025, pt was still on Metoprolol . Verbalizes understanding of plan and will reach back out to patient with recommendations.

## 2024-03-19 NOTE — Assessment & Plan Note (Signed)
-  long standing history of low hgb secondary to CKD -she is on Eliquis  for A.fib -endorses taking oral iron   -prior stool occult test was negative. -she recently presented to her PCP with nosebleeds. She was seen by Dr. Llewellyn in ENT on 07/05/21. -we started her on retacrit  injections on 07/01/21. She also receives IV Venofer  for ferritin <100. She tolerates these well with no side effects.

## 2024-03-19 NOTE — Assessment & Plan Note (Signed)
-  diagnosed in 10/2013, s/p right lumpectomy. Completed 5 years of anastrozole  in 11/2018. -continue cancer surveillance

## 2024-03-20 ENCOUNTER — Telehealth: Payer: Self-pay

## 2024-03-20 ENCOUNTER — Inpatient Hospital Stay

## 2024-03-20 ENCOUNTER — Inpatient Hospital Stay: Attending: Hematology | Admitting: Hematology

## 2024-03-20 DIAGNOSIS — C50211 Malignant neoplasm of upper-inner quadrant of right female breast: Secondary | ICD-10-CM

## 2024-03-20 DIAGNOSIS — D638 Anemia in other chronic diseases classified elsewhere: Secondary | ICD-10-CM

## 2024-03-20 NOTE — Telephone Encounter (Signed)
 Patient did not arrive to her scheduled appointments today. Contacted patient via telephone call. Patient stated she had forgotten about today's appointment. Offered to transfer patient's call to the scheduling team to reschedule today's appointments. Patient declined. Pt stated she is asleep and would like to contact the facility to reschedule appointments.

## 2024-04-22 ENCOUNTER — Ambulatory Visit: Admitting: Cardiology
